# Patient Record
Sex: Male | Born: 1940
Health system: Southern US, Community
[De-identification: ages and names within clinical notes are randomized; demographics above are authoritative.]

## PROBLEM LIST (undated history)

## (undated) DIAGNOSIS — Z789 Other specified health status: Secondary | ICD-10-CM

## (undated) DIAGNOSIS — C259 Malignant neoplasm of pancreas, unspecified: Secondary | ICD-10-CM

## (undated) DIAGNOSIS — Z933 Colostomy status: Secondary | ICD-10-CM

## (undated) DIAGNOSIS — K56609 Unspecified intestinal obstruction, unspecified as to partial versus complete obstruction: Secondary | ICD-10-CM

## (undated) HISTORY — DX: Malignant neoplasm of pancreas, unspecified: C25.9

## (undated) HISTORY — PX: BOWEL RESECTION: SHX1257

## (undated) HISTORY — PX: VASECTOMY: SHX75

## (undated) HISTORY — PX: ABDOMINAL SURGERY: SHX537

---

## 2014-12-12 ENCOUNTER — Inpatient Hospital Stay (HOSPITAL_COMMUNITY)
Admission: EM | Admit: 2014-12-12 | Discharge: 2014-12-17 | DRG: 418 | Disposition: A | Discharge status: Home / Self Care | Payer: Medicare Other | Attending: Internal Medicine | Admitting: Internal Medicine

## 2014-12-12 ENCOUNTER — Encounter (HOSPITAL_COMMUNITY): Payer: Self-pay | Admitting: Emergency Medicine

## 2014-12-12 ENCOUNTER — Emergency Department (HOSPITAL_COMMUNITY): Payer: Medicare Other

## 2014-12-12 DIAGNOSIS — E871 Hypo-osmolality and hyponatremia: Secondary | ICD-10-CM | POA: Diagnosis present

## 2014-12-12 DIAGNOSIS — E876 Hypokalemia: Secondary | ICD-10-CM | POA: Diagnosis not present

## 2014-12-12 DIAGNOSIS — K8020 Calculus of gallbladder without cholecystitis without obstruction: Secondary | ICD-10-CM

## 2014-12-12 DIAGNOSIS — I10 Essential (primary) hypertension: Secondary | ICD-10-CM | POA: Diagnosis present

## 2014-12-12 DIAGNOSIS — R945 Abnormal results of liver function studies: Secondary | ICD-10-CM

## 2014-12-12 DIAGNOSIS — K851 Biliary acute pancreatitis: Principal | ICD-10-CM | POA: Diagnosis present

## 2014-12-12 DIAGNOSIS — R1084 Generalized abdominal pain: Secondary | ICD-10-CM

## 2014-12-12 DIAGNOSIS — K859 Acute pancreatitis without necrosis or infection, unspecified: Secondary | ICD-10-CM | POA: Diagnosis present

## 2014-12-12 DIAGNOSIS — K8070 Calculus of gallbladder and bile duct without cholecystitis without obstruction: Secondary | ICD-10-CM | POA: Diagnosis present

## 2014-12-12 DIAGNOSIS — E861 Hypovolemia: Secondary | ICD-10-CM | POA: Diagnosis present

## 2014-12-12 DIAGNOSIS — R7989 Other specified abnormal findings of blood chemistry: Secondary | ICD-10-CM

## 2014-12-12 HISTORY — DX: Other specified health status: Z78.9

## 2014-12-12 LAB — COMPREHENSIVE METABOLIC PANEL
ALK PHOS: 67 U/L (ref 39–117)
ALT: 212 U/L — AB (ref 0–53)
AST: 300 U/L — ABNORMAL HIGH (ref 0–37)
Albumin: 3.8 g/dL (ref 3.5–5.2)
Anion gap: 10 (ref 5–15)
BILIRUBIN TOTAL: 2.8 mg/dL — AB (ref 0.3–1.2)
BUN: 13 mg/dL (ref 6–23)
CO2: 22 mmol/L (ref 19–32)
Calcium: 9 mg/dL (ref 8.4–10.5)
Chloride: 100 mEq/L (ref 96–112)
Creatinine, Ser: 1 mg/dL (ref 0.50–1.35)
GFR calc non Af Amer: 73 mL/min — ABNORMAL LOW (ref >90)
GFR, EST AFRICAN AMERICAN: 85 mL/min — AB (ref >90)
GLUCOSE: 123 mg/dL — AB (ref 70–99)
Potassium: 4.3 mmol/L (ref 3.5–5.1)
SODIUM: 132 mmol/L — AB (ref 135–145)
Total Protein: 6.7 g/dL (ref 6.0–8.3)

## 2014-12-12 LAB — URINALYSIS, ROUTINE W REFLEX MICROSCOPIC
BILIRUBIN URINE: NEGATIVE
GLUCOSE, UA: NEGATIVE mg/dL
KETONES UR: 15 mg/dL — AB
Leukocytes, UA: NEGATIVE
Nitrite: NEGATIVE
PROTEIN: NEGATIVE mg/dL
Specific Gravity, Urine: >1.046 — ABNORMAL HIGH (ref 1.005–1.030)
Urobilinogen, UA: 0.2 mg/dL (ref 0.0–1.0)
pH: 5.5 (ref 5.0–8.0)

## 2014-12-12 LAB — CBC WITH DIFFERENTIAL/PLATELET
Basophils Absolute: 0 10*3/uL (ref 0.0–0.1)
Basophils Relative: 0 % (ref 0–1)
Eosinophils Absolute: 0 10*3/uL (ref 0.0–0.7)
Eosinophils Relative: 0 % (ref 0–5)
HCT: 45.2 % (ref 39.0–52.0)
Hemoglobin: 15.4 g/dL (ref 13.0–17.0)
LYMPHS ABS: 0.7 10*3/uL (ref 0.7–4.0)
Lymphocytes Relative: 8 % — ABNORMAL LOW (ref 12–46)
MCH: 31.2 pg (ref 26.0–34.0)
MCHC: 34.1 g/dL (ref 30.0–36.0)
MCV: 91.5 fL (ref 78.0–100.0)
MONOS PCT: 10 % (ref 3–12)
Monocytes Absolute: 0.9 10*3/uL (ref 0.1–1.0)
NEUTROS PCT: 82 % — AB (ref 43–77)
Neutro Abs: 7.4 10*3/uL (ref 1.7–7.7)
Platelets: 191 10*3/uL (ref 150–400)
RBC: 4.94 MIL/uL (ref 4.22–5.81)
RDW: 13.3 % (ref 11.5–15.5)
WBC: 8.9 10*3/uL (ref 4.0–10.5)

## 2014-12-12 LAB — URINE MICROSCOPIC-ADD ON

## 2014-12-12 LAB — MAGNESIUM: Magnesium: 2.1 mg/dL (ref 1.5–2.5)

## 2014-12-12 LAB — PHOSPHORUS: PHOSPHORUS: 2.5 mg/dL (ref 2.3–4.6)

## 2014-12-12 LAB — GLUCOSE, CAPILLARY: Glucose-Capillary: 112 mg/dL — ABNORMAL HIGH (ref 70–99)

## 2014-12-12 LAB — I-STAT CG4 LACTIC ACID, ED: Lactic Acid, Venous: 0.79 mmol/L (ref 0.5–2.2)

## 2014-12-12 LAB — LIPASE, BLOOD: Lipase: >3000 U/L — ABNORMAL HIGH (ref 11–59)

## 2014-12-12 MED ORDER — ONDANSETRON HCL 4 MG/2ML IJ SOLN
4.0000 mg | Freq: Four times a day (QID) | INTRAMUSCULAR | Status: DC | PRN
Start: 2014-12-12 — End: 2014-12-17

## 2014-12-12 MED ORDER — OXYCODONE HCL 5 MG PO TABS
5.0000 mg | ORAL_TABLET | ORAL | Status: DC | PRN
  Administered 2014-12-15: 5 mg via ORAL
  Filled 2014-12-12: qty 1

## 2014-12-12 MED ORDER — SODIUM CHLORIDE 0.9 % IV BOLUS (SEPSIS)
1000.0000 mL | INTRAVENOUS | Status: AC
  Administered 2014-12-12: 1000 mL via INTRAVENOUS

## 2014-12-12 MED ORDER — DEXTROSE-NACL 5-0.9 % IV SOLN
INTRAVENOUS | Status: DC
  Administered 2014-12-13 – 2014-12-17 (×9): via INTRAVENOUS

## 2014-12-12 MED ORDER — IOHEXOL 300 MG/ML  SOLN
25.0000 mL | Freq: Once | INTRAMUSCULAR | Status: AC | PRN
  Administered 2014-12-12: 25 mL via ORAL

## 2014-12-12 MED ORDER — HYDROMORPHONE HCL 1 MG/ML IJ SOLN
0.5000 mg | INTRAMUSCULAR | Status: AC
  Administered 2014-12-12: 0.5 mg via INTRAVENOUS
  Filled 2014-12-12: qty 1

## 2014-12-12 MED ORDER — IOHEXOL 300 MG/ML  SOLN
100.0000 mL | Freq: Once | INTRAMUSCULAR | Status: AC | PRN
  Administered 2014-12-12: 100 mL via INTRAVENOUS

## 2014-12-12 MED ORDER — HEPARIN SODIUM (PORCINE) 5000 UNIT/ML IJ SOLN
5000.0000 [IU] | Freq: Three times a day (TID) | INTRAMUSCULAR | Status: DC
  Administered 2014-12-12 – 2014-12-15 (×10): 5000 [IU] via SUBCUTANEOUS
  Filled 2014-12-12 (×18): qty 1

## 2014-12-12 MED ORDER — ONDANSETRON HCL 4 MG/2ML IJ SOLN
4.0000 mg | Freq: Once | INTRAMUSCULAR | Status: AC
  Administered 2014-12-12: 4 mg via INTRAVENOUS
  Filled 2014-12-12: qty 2

## 2014-12-12 MED ORDER — MORPHINE SULFATE 2 MG/ML IJ SOLN: 2.0000 mg | INTRAMUSCULAR | Status: DC | PRN

## 2014-12-12 MED ORDER — ONDANSETRON HCL 4 MG PO TABS: 4.0000 mg | ORAL_TABLET | Freq: Four times a day (QID) | ORAL | Status: DC | PRN

## 2014-12-12 NOTE — H&P (Signed)
[facility] History and Physical  Kevin Villegas [id number] DOB: 01-15-1941 DOA: 12/12/2014  Referring physician: Dr. Aline Brochure PCP: No primary care provider on file.   Chief Complaint: Abdominal discomfort  HPI: Kevin Villegas is a 73 y.o. male  Patient presents with 2 day complaint of abdominal discomfort. The pain since onset has waxed and waned. He denies it getting any worse with drinking fluids or eating foods. Given persistence and problem patient decided to present to the ED for further evaluation recommendations. The pain is at his upper abdomen and radiates to his back. Nothing he is aware of makes it better.  While in the ED patient had CT of abdomen which reported findings consistent with acute pancreatitis without evidence of necrosis or hemorrhage.  Review of Systems:  Constitutional:  No weight loss, night sweats, Fevers, chills, fatigue.  HEENT:  No headaches, Difficulty swallowing,Tooth/dental problems,Sore throat,  No sneezing, itching, ear ache, nasal congestion, post nasal drip,  Cardio-vascular:  No chest pain, Orthopnea, PND, swelling in lower extremities, anasarca, dizziness, palpitations  GI:  No heartburn, indigestion, + abdominal pain, nausea, vomiting, diarrhea, change in bowel habits, loss of appetite  Resp:  No shortness of breath with exertion or at rest. No excess mucus, no productive cough, No non-productive cough, No coughing up of blood.No change in color of mucus.No wheezing.No chest wall deformity  Skin:  no rash or lesions.  GU:  no dysuria, change in color of urine, no urgency or frequency. No flank pain.  Musculoskeletal:  No joint pain or swelling. No decreased range of motion. No back pain.  Psych:  No change in mood or affect. No depression or anxiety. No memory loss.   History reviewed. No pertinent past medical history. History reviewed. No pertinent past surgical history. Social History:  reports that he has never smoked. He  does not have any smokeless tobacco history on file. He reports that he does not drink alcohol or use illicit drugs.  No Known Allergies  Family history - Denies any family history of any illnesses  Prior to Admission medications   Not on File   Physical Exam: Filed Vitals:   12/12/14 1400 12/12/14 1500 12/12/14 1613 12/12/14 1615  BP: 164/92 161/80 158/84   Pulse: 60 62  69  Temp:      TempSrc:      Resp:      SpO2: 94% 96%  96%    Wt Readings from Last 3 Encounters:  No data found for Wt    General:  Appears calm and comfortable Eyes: PERRL, normal lids, irises & conjunctiva ENT: grossly normal hearing, lips & tongue Neck: no LAD, masses or thyromegaly Cardiovascular: RRR, no m/r/g. No LE edema.  Respiratory: CTA bilaterally, no w/r/r. Normal respiratory effort. Abdomen: soft, nt, nd, no rebound tenderness no guarding Skin: no rash or induration seen on limited exam Musculoskeletal: grossly normal tone BUE/BLE Psychiatric: grossly normal mood and affect, speech fluent and appropriate Neurologic: no facial asymmetry, moves extremities equally, answers questions appropriately          Labs on Admission:  Basic Metabolic Panel:  Recent Labs Lab 12/12/14 1350  NA 132*  K 4.3  CL 100  CO2 22  GLUCOSE 123*  BUN 13  CREATININE 1.00  CALCIUM 9.0   Liver Function Tests:  Recent Labs Lab 12/12/14 1350  AST 300*  ALT 212*  ALKPHOS 67  BILITOT 2.8*  PROT 6.7  ALBUMIN 3.8   No results for input(s): LIPASE, AMYLASE in  the last 168 hours. No results for input(s): AMMONIA in the last 168 hours. CBC:  Recent Labs Lab 12/12/14 1350  WBC 8.9  NEUTROABS 7.4  HGB 15.4  HCT 45.2  MCV 91.5  PLT 191   Cardiac Enzymes: No results for input(s): CKTOTAL, CKMB, CKMBINDEX, TROPONINI in the last 168 hours.  BNP (last 3 results) No results for input(s): PROBNP in the last 8760 hours. CBG: No results for input(s): GLUCAP in the last 168 hours.  Radiological  Exams on Admission: Ct Abdomen Pelvis W Contrast  12/12/2014   CLINICAL DATA:  Abdominal pain.  Initial encounter.  EXAM: CT ABDOMEN AND PELVIS WITH CONTRAST  TECHNIQUE: Multidetector CT imaging of the abdomen and pelvis was performed using the standard protocol following bolus administration of intravenous contrast.  CONTRAST:  136mL OMNIPAQUE IOHEXOL 300 MG/ML  SOLN  COMPARISON:  None.  FINDINGS: Stranding throughout the parenchyma of the pancreas compatible with acute pancreatitis. No localized fluid collection about the pancreas. There is stranding about the retroperitoneal fat. No evidence of pancreatic hemorrhage or necrosis.  Several gallstones are noted  Several hypodensities throughout the liver are likely small cysts.  Adrenal glands and spleen are within normal limits. Bilateral benign-appearing renal cysts with cortical atrophy. Some hypodensities in the kidneys are too small to characterize  Moderate aortic valvular calcification.  Bladder is distended.  Unremarkable prostate  Extensive sigmoid diverticulosis. Moderate diverticulosis throughout the remainder of the colon.  Free fluid in the right lower quadrant, lesser sac, and pelvis likely related to pancreatitis.  Advanced degenerative disc disease in the lumbar spine.  IMPRESSION: Findings compatible with acute pancreatitis. No evidence of necrosis or hemorrhage. No acute pancreatic fluid collection.  Cholelithiasis.   Electronically Signed   By: Maryclare Bean M.D.   On: 12/12/2014 15:54     Assessment/Plan Active Problems:   Acute pancreatitis - Bowel rest, place on maintenance IV fluids - Supportive therapy with pain control and antiemetics - Lipid panel - Patient does not drink alcohol - Most likely cause is gallstones of which patient once he has improvement in condition can follow up electively with general surgery  Code Status: full DVT Prophylaxis: heparin Family Communication: No family at bedside Disposition Plan: Pending  improvement in condition  Time spent: > 40 minutes  Kevin Villegas [facility] Pager [phone]

## 2014-12-12 NOTE — ED Notes (Signed)
Pt c/o abdominal pain and back pain. Pt denies N/V. Abdomen tender to touch.

## 2014-12-12 NOTE — ED Notes (Signed)
Attempted to collect urine, pt states he voided at CT and it was not collected. States he will attempt later

## 2014-12-12 NOTE — ED Notes (Signed)
Pt given ice chips per MD

## 2014-12-12 NOTE — ED Provider Notes (Signed)
CSN: [id number]     Arrival date & time 12/12/14  1313 History   First MD Initiated Contact with Patient 12/12/14 1316     Chief Complaint  Patient presents with  . Abdominal Pain  . Back Pain     (Consider location/radiation/quality/duration/timing/severity/associated sxs/prior Treatment) Patient is a 73 y.o. male presenting with abdominal pain and back pain.  Abdominal Pain Pain location:  Generalized Pain quality: aching   Pain radiates to:  Back Pain severity:  Moderate Onset quality:  Sudden Duration:  1 day Timing:  Constant Progression:  Worsening Chronicity:  New Context comment:  Sponatneously Relieved by:  Nothing Worsened by:  Nothing tried Ineffective treatments:  None tried Associated symptoms: no chest pain, no cough, no diarrhea, no dysuria, no fever, no hematuria, no nausea, no shortness of breath and no vomiting   Back Pain Associated symptoms: abdominal pain   Associated symptoms: no chest pain, no dysuria, no fever, no headaches and no numbness     History reviewed. No pertinent past medical history. History reviewed. No pertinent past surgical history. No family history on file. History  Substance Use Topics  . Smoking status: Never Smoker   . Smokeless tobacco: Not on file  . Alcohol Use: No    Review of Systems  Constitutional: Negative for fever.  HENT: Negative for drooling and rhinorrhea.   Eyes: Negative for pain.  Respiratory: Negative for cough and shortness of breath.   Cardiovascular: Negative for chest pain and leg swelling.  Gastrointestinal: Positive for abdominal pain. Negative for nausea, vomiting and diarrhea.  Genitourinary: Negative for dysuria and hematuria.  Musculoskeletal: Positive for back pain. Negative for gait problem and neck pain.  Skin: Negative for color change.  Neurological: Negative for numbness and headaches.  Hematological: Negative for adenopathy.  Psychiatric/Behavioral: Negative for behavioral problems.   All other systems reviewed and are negative.     Allergies  Review of patient's allergies indicates no known allergies.  Home Medications   Prior to Admission medications   Not on File   BP 183/112 mmHg  Pulse 75  Temp(Src) 97.5 F (36.4 C) (Oral)  Resp 18  SpO2 96% Physical Exam  Constitutional: He is oriented to person, place, and time. He appears well-developed and well-nourished.  HENT:  Head: Normocephalic and atraumatic.  Right Ear: External ear normal.  Left Ear: External ear normal.  Nose: Nose normal.  Mouth/Throat: Oropharynx is clear and moist. No oropharyngeal exudate.  Eyes: Conjunctivae and EOM are normal. Pupils are equal, round, and reactive to light.  Neck: Normal range of motion. Neck supple.  Cardiovascular: Normal rate, regular rhythm, normal heart sounds and intact distal pulses.  Exam reveals no gallop and no friction rub.   No murmur heard. Pulmonary/Chest: Effort normal and breath sounds normal. No respiratory distress. He has no wheezes.  Abdominal: Soft. Bowel sounds are normal. He exhibits no distension. There is tenderness. There is no rebound and no guarding.  Diffuse tenderness to palpation of the abdomen which appears worse in the epigastric area.  Musculoskeletal: Normal range of motion. He exhibits no edema or tenderness.  Neurological: He is alert and oriented to person, place, and time.  Skin: Skin is warm and dry.  Psychiatric: He has a normal mood and affect. His behavior is normal.  Nursing note and vitals reviewed.   ED Course  Procedures (including critical care time) Labs Review Labs Reviewed  CBC WITH DIFFERENTIAL - Abnormal; Notable for the following:    Neutrophils Relative %  82 (*)    Lymphocytes Relative 8 (*)    All other components within normal limits  COMPREHENSIVE METABOLIC PANEL - Abnormal; Notable for the following:    Sodium 132 (*)    Glucose, Bld 123 (*)    AST 300 (*)    ALT 212 (*)    Total Bilirubin 2.8  (*)    GFR calc non Af Amer 73 (*)    GFR calc Af Amer 85 (*)    All other components within normal limits  LIPASE, BLOOD  URINALYSIS, ROUTINE W REFLEX MICROSCOPIC  I-STAT CG4 LACTIC ACID, ED    Imaging Review Ct Abdomen Pelvis W Contrast  12/12/2014   CLINICAL DATA:  Abdominal pain.  Initial encounter.  EXAM: CT ABDOMEN AND PELVIS WITH CONTRAST  TECHNIQUE: Multidetector CT imaging of the abdomen and pelvis was performed using the standard protocol following bolus administration of intravenous contrast.  CONTRAST:  114mL OMNIPAQUE IOHEXOL 300 MG/ML  SOLN  COMPARISON:  None.  FINDINGS: Stranding throughout the parenchyma of the pancreas compatible with acute pancreatitis. No localized fluid collection about the pancreas. There is stranding about the retroperitoneal fat. No evidence of pancreatic hemorrhage or necrosis.  Several gallstones are noted  Several hypodensities throughout the liver are likely small cysts.  Adrenal glands and spleen are within normal limits. Bilateral benign-appearing renal cysts with cortical atrophy. Some hypodensities in the kidneys are too small to characterize  Moderate aortic valvular calcification.  Bladder is distended.  Unremarkable prostate  Extensive sigmoid diverticulosis. Moderate diverticulosis throughout the remainder of the colon.  Free fluid in the right lower quadrant, lesser sac, and pelvis likely related to pancreatitis.  Advanced degenerative disc disease in the lumbar spine.  IMPRESSION: Findings compatible with acute pancreatitis. No evidence of necrosis or hemorrhage. No acute pancreatic fluid collection.  Cholelithiasis.   Electronically Signed   By: Maryclare Bean M.D.   On: 12/12/2014 15:54     EKG Interpretation None      MDM   Final diagnoses:  Abdominal pain, generalized  Acute pancreatitis, unspecified pancreatitis type    1:26 PM 73 y.o. male who presents with sudden onset diffuse abdominal pain which started yesterday and has been  worsening since that time. He feels like the pain is slightly worse in the epigastric area. He states he has no medical problems, does not drink, does not smoke. He is mildly hypertensive here, vital signs otherwise unremarkable. Abdomen is soft with diffuse tenderness worse in the epigastric area. We'll get CT scan of abdomen and pain control with Dilaudid.  Pt found to have pancreatitis. Will admit to hospitalist.   Pamella Pert, MD 12/12/14 [phone]

## 2014-12-13 DIAGNOSIS — E871 Hypo-osmolality and hyponatremia: Secondary | ICD-10-CM

## 2014-12-13 DIAGNOSIS — I10 Essential (primary) hypertension: Secondary | ICD-10-CM | POA: Diagnosis present

## 2014-12-13 DIAGNOSIS — K851 Biliary acute pancreatitis: Principal | ICD-10-CM

## 2014-12-13 DIAGNOSIS — E876 Hypokalemia: Secondary | ICD-10-CM | POA: Diagnosis not present

## 2014-12-13 DIAGNOSIS — R1013 Epigastric pain: Secondary | ICD-10-CM

## 2014-12-13 DIAGNOSIS — E861 Hypovolemia: Secondary | ICD-10-CM | POA: Diagnosis present

## 2014-12-13 DIAGNOSIS — K8070 Calculus of gallbladder and bile duct without cholecystitis without obstruction: Secondary | ICD-10-CM | POA: Diagnosis present

## 2014-12-13 DIAGNOSIS — R740 Nonspecific elevation of levels of transaminase and lactic acid dehydrogenase [LDH]: Secondary | ICD-10-CM

## 2014-12-13 DIAGNOSIS — R1084 Generalized abdominal pain: Secondary | ICD-10-CM | POA: Diagnosis present

## 2014-12-13 LAB — LIPID PANEL
CHOL/HDL RATIO: 2.2 ratio
CHOLESTEROL: 170 mg/dL (ref 0–200)
HDL: 77 mg/dL (ref >39)
LDL CALC: 80 mg/dL (ref 0–99)
TRIGLYCERIDES: 66 mg/dL (ref <150)
VLDL: 13 mg/dL (ref 0–40)

## 2014-12-13 LAB — GLUCOSE, CAPILLARY
GLUCOSE-CAPILLARY: 111 mg/dL — AB (ref 70–99)
GLUCOSE-CAPILLARY: 112 mg/dL — AB (ref 70–99)
GLUCOSE-CAPILLARY: 115 mg/dL — AB (ref 70–99)
GLUCOSE-CAPILLARY: 115 mg/dL — AB (ref 70–99)
Glucose-Capillary: 104 mg/dL — ABNORMAL HIGH (ref 70–99)
Glucose-Capillary: 112 mg/dL — ABNORMAL HIGH (ref 70–99)
Glucose-Capillary: 113 mg/dL — ABNORMAL HIGH (ref 70–99)

## 2014-12-13 LAB — BASIC METABOLIC PANEL
Anion gap: 8 (ref 5–15)
BUN: 7 mg/dL (ref 6–23)
CHLORIDE: 102 meq/L (ref 96–112)
CO2: 25 mmol/L (ref 19–32)
CREATININE: 1.01 mg/dL (ref 0.50–1.35)
Calcium: 8.5 mg/dL (ref 8.4–10.5)
GFR calc Af Amer: 84 mL/min — ABNORMAL LOW (ref >90)
GFR calc non Af Amer: 72 mL/min — ABNORMAL LOW (ref >90)
GLUCOSE: 124 mg/dL — AB (ref 70–99)
Potassium: 3.9 mmol/L (ref 3.5–5.1)
Sodium: 135 mmol/L (ref 135–145)

## 2014-12-13 LAB — CBC
HEMATOCRIT: 41 % (ref 39.0–52.0)
HEMOGLOBIN: 13.7 g/dL (ref 13.0–17.0)
MCH: 30.9 pg (ref 26.0–34.0)
MCHC: 33.4 g/dL (ref 30.0–36.0)
MCV: 92.6 fL (ref 78.0–100.0)
Platelets: 193 10*3/uL (ref 150–400)
RBC: 4.43 MIL/uL (ref 4.22–5.81)
RDW: 13.4 % (ref 11.5–15.5)
WBC: 8.5 10*3/uL (ref 4.0–10.5)

## 2014-12-13 MED ORDER — CHLORHEXIDINE GLUCONATE 0.12 % MT SOLN
15.0000 mL | Freq: Two times a day (BID) | OROMUCOSAL | Status: DC
  Administered 2014-12-13 – 2014-12-15 (×6): 15 mL via OROMUCOSAL
  Filled 2014-12-13 (×6): qty 15

## 2014-12-13 MED ORDER — CETYLPYRIDINIUM CHLORIDE 0.05 % MT LIQD
7.0000 mL | Freq: Two times a day (BID) | OROMUCOSAL | Status: DC
  Administered 2014-12-14 – 2014-12-15 (×3): 7 mL via OROMUCOSAL

## 2014-12-13 MED ORDER — AMLODIPINE BESYLATE 5 MG PO TABS
5.0000 mg | ORAL_TABLET | Freq: Every day | ORAL | Status: DC
  Administered 2014-12-13 – 2014-12-14 (×2): 5 mg via ORAL
  Filled 2014-12-13 (×2): qty 1

## 2014-12-13 NOTE — Progress Notes (Signed)
[facility] PROGRESS NOTE  Kevin Villegas [id number] DOB: November 02, 1941 DOA: 12/12/2014 PCP: No primary care provider on file.  Assessment/Plan: 1. Suspected gallstone pancreatitis -Patient denies history of alcohol abuse, with fasting lipid panel showing triglycerides of 66.  -CT scan of abdomen and pelvis showed a pancreatitis along with evidence of cholelithiasis. -I suspect this represents gallstone pancreatitis for which general surgery was consulted -Patient reporting significant improvement to his pain, advancing diet to clears today. -Continue supportive care, await surgery's recommendations.  2.  Hyponatremia. -Initial labs showing sodium of 132, improved to 135 -Likely secondary to hypovolemia  3.  Elevated transaminases -Labs showing ALT of 212 with AST of 300. Patient denies history of alcohol abuse.  -CT scan of abdomen and pelvis showing hypodensities throughout liver likely representing small cysts long with presence of stones. -Suspect secondary to gallbadder disease -Surgery consulted  4.  Hypertension -Added Norvasc 5 mg by mouth daily  Code Status: Full code Family Communication:  Disposition Plan: Anticipate discharge home when medically stable   Consultants:  General surgery   HPI/Subjective: Patient is a pleasant 73 year old gentleman withthickened past medical history who was admitted to the medicine service on 12/12/2014 presenting with severe abdominal discomfort progressively worsening over the past 2 days. He is highly functioning, denies alcohol abuse and states having an overall healthy diet. Lab work revealed a lipase level greater than 3000. He had a CT scan of abdomen and pelvis showing findings compatible with acute pancreatitis. Also noted was a presence of cholelithiasis, without evidence of acute cholecystitis. Patient was admitted to [facility], made nothing by mouth, started on IV fluids, narcotic analgesics for pain control. Case was  discussed with general surgery regarding the possibility of gallstone pancreatitis.  Objective: Filed Vitals:   12/13/14 0520  BP: 164/76  Pulse: 68  Temp: 99.2 F (37.3 C)  Resp: 16    Intake/Output Summary (Last 24 hours) at 12/13/14 [id number] Last data filed at 12/13/14 0730  Gross per 24 hour  Intake    800 ml  Output    300 ml  Net    500 ml   Filed Weights   12/12/14 1736  Weight: 86.183 kg (190 lb)    Exam:   General:  Patient is in no acute distress, reports feeling better awake and alert  Cardiovascular: Regular rate and rhythm normal S1-S2 no murmurs rubs or gallops  Respiratory: Normal respiratory effort, lungs are clear to auscultation bilaterally  Abdomen: His abdomen is soft, mild tenderness to palpation over epigastric region, positive bowel sounds across all 4 quadrants  Musculoskeletal: No edema  Data Reviewed: Basic Metabolic Panel:  Recent Labs Lab 12/12/14 1350 12/12/14 1955 12/13/14 0420  NA 132*  --  135  K 4.3  --  3.9  CL 100  --  102  CO2 22  --  25  GLUCOSE 123*  --  124*  BUN 13  --  7  CREATININE 1.00  --  1.01  CALCIUM 9.0  --  8.5  MG  --  2.1  --   PHOS  --  2.5  --    Liver Function Tests:  Recent Labs Lab 12/12/14 1350  AST 300*  ALT 212*  ALKPHOS 67  BILITOT 2.8*  PROT 6.7  ALBUMIN 3.8    Recent Labs Lab 12/12/14 1350  LIPASE >3000*   No results for input(s): AMMONIA in the last 168 hours. CBC:  Recent Labs Lab 12/12/14 1350 12/13/14 0420  WBC 8.9 8.5  NEUTROABS 7.4  --   HGB 15.4 13.7  HCT 45.2 41.0  MCV 91.5 92.6  PLT 191 193   Cardiac Enzymes: No results for input(s): CKTOTAL, CKMB, CKMBINDEX, TROPONINI in the last 168 hours. BNP (last 3 results) No results for input(s): PROBNP in the last 8760 hours. CBG:  Recent Labs Lab 12/12/14 2005 12/12/14 2359 12/13/14 0354 12/13/14 0731  GLUCAP 112* 112* 104* 115*    No results found for this or any previous visit (from the past 240 hour(s)).    Studies: Ct Abdomen Pelvis W Contrast  12/12/2014   CLINICAL DATA:  Abdominal pain.  Initial encounter.  EXAM: CT ABDOMEN AND PELVIS WITH CONTRAST  TECHNIQUE: Multidetector CT imaging of the abdomen and pelvis was performed using the standard protocol following bolus administration of intravenous contrast.  CONTRAST:  146mL OMNIPAQUE IOHEXOL 300 MG/ML  SOLN  COMPARISON:  None.  FINDINGS: Stranding throughout the parenchyma of the pancreas compatible with acute pancreatitis. No localized fluid collection about the pancreas. There is stranding about the retroperitoneal fat. No evidence of pancreatic hemorrhage or necrosis.  Several gallstones are noted  Several hypodensities throughout the liver are likely small cysts.  Adrenal glands and spleen are within normal limits. Bilateral benign-appearing renal cysts with cortical atrophy. Some hypodensities in the kidneys are too small to characterize  Moderate aortic valvular calcification.  Bladder is distended.  Unremarkable prostate  Extensive sigmoid diverticulosis. Moderate diverticulosis throughout the remainder of the colon.  Free fluid in the right lower quadrant, lesser sac, and pelvis likely related to pancreatitis.  Advanced degenerative disc disease in the lumbar spine.  IMPRESSION: Findings compatible with acute pancreatitis. No evidence of necrosis or hemorrhage. No acute pancreatic fluid collection.  Cholelithiasis.   Electronically Signed   By: Maryclare Bean M.D.   On: 12/12/2014 15:54    Scheduled Meds: . antiseptic oral rinse  7 mL Mouth Rinse q12n4p  . chlorhexidine  15 mL Mouth Rinse BID  . heparin  5,000 Units Subcutaneous 3 times per day   Continuous Infusions: . dextrose 5 % and 0.9% NaCl      Active Problems:   Acute pancreatitis    Time spent: 35 min    Kelvin Cellar  [facility] Pager [phone]. If 7PM-7AM, please contact night-coverage at [email or web address], password [email or web address] 12/13/2014, 9:28 AM  LOS: 1 day

## 2014-12-13 NOTE — Consult Note (Signed)
Reason for Consult:Pancreatitis Referring Physician: E. Colbe Villegas is an 73 y.o. male.  HPI: Kevin Villegas was in his usual state of health when he lied down for a nap on Wednesday afternoon. He was awakened by severe epigastric abdominal and back pain. This eased off somewhat but remained persistent until he sought evaluation in the ED on Friday. He denies any change with eating or drinking. He denies any prior episodes. Drinks alcohol rarely.  Past Medical History  Diagnosis Date  . Medical history non-contributory     Past Surgical History  Procedure Laterality Date  . No past surgeries      History reviewed. No pertinent family history.  Social History:  reports that he has never smoked. He does not have any smokeless tobacco history on file. He reports that he does not use illicit drugs. His alcohol history is not on file.  Allergies: No Known Allergies  Medications: I have reviewed the patient's current medications.  Results for orders placed or performed during the hospital encounter of 12/12/14 (from the past 48 hour(s))  CBC with Differential     Status: Abnormal   Collection Time: 12/12/14  1:50 PM  Result Value Ref Range   WBC 8.9 4.0 - 10.5 K/uL   RBC 4.94 4.22 - 5.81 MIL/uL   Hemoglobin 15.4 13.0 - 17.0 g/dL   HCT 45.2 39.0 - 52.0 %   MCV 91.5 78.0 - 100.0 fL   MCH 31.2 26.0 - 34.0 pg   MCHC 34.1 30.0 - 36.0 g/dL   RDW 13.3 11.5 - 15.5 %   Platelets 191 150 - 400 K/uL   Neutrophils Relative % 82 (H) 43 - 77 %   Neutro Abs 7.4 1.7 - 7.7 K/uL   Lymphocytes Relative 8 (L) 12 - 46 %   Lymphs Abs 0.7 0.7 - 4.0 K/uL   Monocytes Relative 10 3 - 12 %   Monocytes Absolute 0.9 0.1 - 1.0 K/uL   Eosinophils Relative 0 0 - 5 %   Eosinophils Absolute 0.0 0.0 - 0.7 K/uL   Basophils Relative 0 0 - 1 %   Basophils Absolute 0.0 0.0 - 0.1 K/uL  Comprehensive metabolic panel     Status: Abnormal   Collection Time: 12/12/14  1:50 PM  Result Value Ref Range   Sodium 132  (L) 135 - 145 mmol/L    Comment: Please note change in reference range.   Potassium 4.3 3.5 - 5.1 mmol/L    Comment: Please note change in reference range.   Chloride 100 96 - 112 mEq/L   CO2 22 19 - 32 mmol/L   Glucose, Bld 123 (H) 70 - 99 mg/dL   BUN 13 6 - 23 mg/dL   Creatinine, Ser 1.00 0.50 - 1.35 mg/dL   Calcium 9.0 8.4 - 10.5 mg/dL   Total Protein 6.7 6.0 - 8.3 g/dL   Albumin 3.8 3.5 - 5.2 g/dL   AST 300 (H) 0 - 37 U/L   ALT 212 (H) 0 - 53 U/L   Alkaline Phosphatase 67 39 - 117 U/L   Total Bilirubin 2.8 (H) 0.3 - 1.2 mg/dL   GFR calc non Af Amer 73 (L) >90 mL/min   GFR calc Af Amer 85 (L) >90 mL/min    Comment: (NOTE) The eGFR has been calculated using the CKD EPI equation. This calculation has not been validated in all clinical situations. eGFR's persistently <90 mL/min signify possible Chronic Kidney Disease.    Anion gap 10 5 -  15  Lipase, blood     Status: Abnormal   Collection Time: 12/12/14  1:50 PM  Result Value Ref Range   Lipase >3000 (H) 11 - 59 U/L    Comment: RESULTS CONFIRMED BY MANUAL DILUTION  I-Stat CG4 Lactic Acid, ED     Status: None   Collection Time: 12/12/14  2:01 PM  Result Value Ref Range   Lactic Acid, Venous 0.79 0.5 - 2.2 mmol/L  Urinalysis, Routine w reflex microscopic     Status: Abnormal   Collection Time: 12/12/14  5:21 PM  Result Value Ref Range   Color, Urine YELLOW YELLOW   APPearance CLEAR CLEAR   Specific Gravity, Urine >1.046 (H) 1.005 - 1.030   pH 5.5 5.0 - 8.0   Glucose, UA NEGATIVE NEGATIVE mg/dL   Hgb urine dipstick TRACE (A) NEGATIVE   Bilirubin Urine NEGATIVE NEGATIVE   Ketones, ur 15 (A) NEGATIVE mg/dL   Protein, ur NEGATIVE NEGATIVE mg/dL   Urobilinogen, UA 0.2 0.0 - 1.0 mg/dL   Nitrite NEGATIVE NEGATIVE   Leukocytes, UA NEGATIVE NEGATIVE  Urine microscopic-add on     Status: None   Collection Time: 12/12/14  5:21 PM  Result Value Ref Range   Squamous Epithelial / LPF RARE RARE   WBC, UA 0-2 <3 WBC/hpf   RBC /  HPF 0-2 <3 RBC/hpf   Urine-Other MANY YEAST   Magnesium     Status: None   Collection Time: 12/12/14  7:55 PM  Result Value Ref Range   Magnesium 2.1 1.5 - 2.5 mg/dL  Phosphorus     Status: None   Collection Time: 12/12/14  7:55 PM  Result Value Ref Range   Phosphorus 2.5 2.3 - 4.6 mg/dL  Glucose, capillary     Status: Abnormal   Collection Time: 12/12/14  8:05 PM  Result Value Ref Range   Glucose-Capillary 112 (H) 70 - 99 mg/dL   Comment 1 Notify RN    Comment 2 Documented in Chart   Glucose, capillary     Status: Abnormal   Collection Time: 12/12/14 11:59 PM  Result Value Ref Range   Glucose-Capillary 112 (H) 70 - 99 mg/dL   Comment 1 Notify RN    Comment 2 Documented in Chart   Glucose, capillary     Status: Abnormal   Collection Time: 12/13/14  3:54 AM  Result Value Ref Range   Glucose-Capillary 104 (H) 70 - 99 mg/dL   Comment 1 Notify RN    Comment 2 Documented in Chart   Basic metabolic panel     Status: Abnormal   Collection Time: 12/13/14  4:20 AM  Result Value Ref Range   Sodium 135 135 - 145 mmol/L    Comment: Please note change in reference range.   Potassium 3.9 3.5 - 5.1 mmol/L    Comment: Please note change in reference range.   Chloride 102 96 - 112 mEq/L   CO2 25 19 - 32 mmol/L   Glucose, Bld 124 (H) 70 - 99 mg/dL   BUN 7 6 - 23 mg/dL   Creatinine, Ser 1.01 0.50 - 1.35 mg/dL   Calcium 8.5 8.4 - 10.5 mg/dL   GFR calc non Af Amer 72 (L) >90 mL/min   GFR calc Af Amer 84 (L) >90 mL/min    Comment: (NOTE) The eGFR has been calculated using the CKD EPI equation. This calculation has not been validated in all clinical situations. eGFR's persistently <90 mL/min signify possible Chronic Kidney Disease.  Anion gap 8 5 - 15  CBC     Status: None   Collection Time: 12/13/14  4:20 AM  Result Value Ref Range   WBC 8.5 4.0 - 10.5 K/uL   RBC 4.43 4.22 - 5.81 MIL/uL   Hemoglobin 13.7 13.0 - 17.0 g/dL   HCT 41.0 39.0 - 52.0 %   MCV 92.6 78.0 - 100.0 fL   MCH  30.9 26.0 - 34.0 pg   MCHC 33.4 30.0 - 36.0 g/dL   RDW 13.4 11.5 - 15.5 %   Platelets 193 150 - 400 K/uL  Lipid panel     Status: None   Collection Time: 12/13/14  4:20 AM  Result Value Ref Range   Cholesterol 170 0 - 200 mg/dL   Triglycerides 66 <150 mg/dL   HDL 77 >39 mg/dL   Total CHOL/HDL Ratio 2.2 RATIO   VLDL 13 0 - 40 mg/dL   LDL Cholesterol 80 0 - 99 mg/dL    Comment:        Total Cholesterol/HDL:CHD Risk Coronary Heart Disease Risk Table                     Men   Women  1/2 Average Risk   3.4   3.3  Average Risk       5.0   4.4  2 X Average Risk   9.6   7.1  3 X Average Risk  23.4   11.0        Use the calculated Patient Ratio above and the CHD Risk Table to determine the patient's CHD Risk.        ATP III CLASSIFICATION (LDL):  <100     mg/dL   Optimal  100-129  mg/dL   Near or Above                    Optimal  130-159  mg/dL   Borderline  160-189  mg/dL   High  >190     mg/dL   Very High   Glucose, capillary     Status: Abnormal   Collection Time: 12/13/14  7:31 AM  Result Value Ref Range   Glucose-Capillary 115 (H) 70 - 99 mg/dL    Ct Abdomen Pelvis W Contrast  12/12/2014   CLINICAL DATA:  Abdominal pain.  Initial encounter.  EXAM: CT ABDOMEN AND PELVIS WITH CONTRAST  TECHNIQUE: Multidetector CT imaging of the abdomen and pelvis was performed using the standard protocol following bolus administration of intravenous contrast.  CONTRAST:  157m OMNIPAQUE IOHEXOL 300 MG/ML  SOLN  COMPARISON:  None.  FINDINGS: Stranding throughout the parenchyma of the pancreas compatible with acute pancreatitis. No localized fluid collection about the pancreas. There is stranding about the retroperitoneal fat. No evidence of pancreatic hemorrhage or necrosis.  Several gallstones are noted  Several hypodensities throughout the liver are likely small cysts.  Adrenal glands and spleen are within normal limits. Bilateral benign-appearing renal cysts with cortical atrophy. Some  hypodensities in the kidneys are too small to characterize  Moderate aortic valvular calcification.  Bladder is distended.  Unremarkable prostate  Extensive sigmoid diverticulosis. Moderate diverticulosis throughout the remainder of the colon.  Free fluid in the right lower quadrant, lesser sac, and pelvis likely related to pancreatitis.  Advanced degenerative disc disease in the lumbar spine.  IMPRESSION: Findings compatible with acute pancreatitis. No evidence of necrosis or hemorrhage. No acute pancreatic fluid collection.  Cholelithiasis.   Electronically Signed   By:  Art  Hoss M.D.   On: 12/12/2014 15:54    Review of Systems  Constitutional: Negative for fever, chills and weight loss.  Gastrointestinal: Positive for abdominal pain. Negative for nausea and vomiting.  All other systems reviewed and are negative.  Blood pressure 164/76, pulse 68, temperature 99.2 F (37.3 C), temperature source Oral, resp. rate 16, height 5' 11"  (1.803 m), weight 190 lb (86.183 kg), SpO2 96 %. Physical Exam  Constitutional: He appears well-developed and well-nourished. No distress.  HENT:  Head: Normocephalic and atraumatic.  Eyes: Conjunctivae are normal. No scleral icterus.  Neck: Normal range of motion. Neck supple.  Cardiovascular: Normal rate, regular rhythm, normal heart sounds and intact distal pulses.  Exam reveals no gallop and no friction rub.   No murmur heard. Respiratory: Effort normal and breath sounds normal. No respiratory distress. He has no wheezes. He has no rales. He exhibits no tenderness.  GI: Soft. Bowel sounds are normal. He exhibits no distension. There is no tenderness. There is no rebound and no guarding.  Musculoskeletal: Normal range of motion.  Lymphadenopathy:    He has no cervical adenopathy.  Neurological: He is alert.  Skin: Skin is warm and dry. He is not diaphoretic.  Psychiatric: He has a normal mood and affect. His behavior is normal.    Assessment/Plan: GS  pancreatitis -- Recommend compete bowel rest, aggressive hydration, and repeat labs in am. If prompt and uncomplicated resolution of his pancreatitis occurs hope for cholecystectomy this admission. Cholelithiasis -- Will need elective cholecystecomy whether this admission or electively as outpatient. He is due to go to [place] shortly where he spends his winters -- this may complicate things somewhat.    Lisette Abu, PA-C Pager: [phone] 12/13/2014, 10:56 AM

## 2014-12-14 DIAGNOSIS — K859 Acute pancreatitis, unspecified: Secondary | ICD-10-CM

## 2014-12-14 LAB — GLUCOSE, CAPILLARY
GLUCOSE-CAPILLARY: 130 mg/dL — AB (ref 70–99)
GLUCOSE-CAPILLARY: 110 mg/dL — AB (ref 70–99)
GLUCOSE-CAPILLARY: 104 mg/dL — AB (ref 70–99)
Glucose-Capillary: 117 mg/dL — ABNORMAL HIGH (ref 70–99)
Glucose-Capillary: 105 mg/dL — ABNORMAL HIGH (ref 70–99)
Glucose-Capillary: 94 mg/dL (ref 70–99)

## 2014-12-14 LAB — CBC
HCT: 38.6 % — ABNORMAL LOW (ref 39.0–52.0)
Hemoglobin: 12.9 g/dL — ABNORMAL LOW (ref 13.0–17.0)
MCH: 31.2 pg (ref 26.0–34.0)
MCHC: 33.4 g/dL (ref 30.0–36.0)
MCV: 93.2 fL (ref 78.0–100.0)
PLATELETS: 192 10*3/uL (ref 150–400)
RBC: 4.14 MIL/uL — ABNORMAL LOW (ref 4.22–5.81)
RDW: 13.3 % (ref 11.5–15.5)
WBC: 8.8 10*3/uL (ref 4.0–10.5)

## 2014-12-14 LAB — COMPREHENSIVE METABOLIC PANEL
ALBUMIN: 2.9 g/dL — AB (ref 3.5–5.2)
ALK PHOS: 76 U/L (ref 39–117)
ALT: 128 U/L — ABNORMAL HIGH (ref 0–53)
ANION GAP: 6 (ref 5–15)
AST: 89 U/L — AB (ref 0–37)
BUN: 5 mg/dL — ABNORMAL LOW (ref 6–23)
CHLORIDE: 104 meq/L (ref 96–112)
CO2: 25 mmol/L (ref 19–32)
Calcium: 8.3 mg/dL — ABNORMAL LOW (ref 8.4–10.5)
Creatinine, Ser: 0.92 mg/dL (ref 0.50–1.35)
GFR calc Af Amer: >90 mL/min (ref >90)
GFR calc non Af Amer: 82 mL/min — ABNORMAL LOW (ref >90)
Glucose, Bld: 121 mg/dL — ABNORMAL HIGH (ref 70–99)
POTASSIUM: 3.7 mmol/L (ref 3.5–5.1)
SODIUM: 135 mmol/L (ref 135–145)
Total Bilirubin: 1.1 mg/dL (ref 0.3–1.2)
Total Protein: 5.8 g/dL — ABNORMAL LOW (ref 6.0–8.3)

## 2014-12-14 LAB — LIPASE, BLOOD: LIPASE: 284 U/L — AB (ref 11–59)

## 2014-12-14 MED ORDER — AMLODIPINE BESYLATE 10 MG PO TABS
10.0000 mg | ORAL_TABLET | Freq: Every day | ORAL | Status: DC
  Administered 2014-12-15 – 2014-12-17 (×3): 10 mg via ORAL
  Filled 2014-12-14 (×3): qty 1

## 2014-12-14 NOTE — Progress Notes (Signed)
[facility] PROGRESS NOTE  Kevin Villegas [id number] DOB: [birth year] DOA: 12/12/2014 PCP: No primary care provider on file.  Assessment/Plan: 1. Suspected gallstone pancreatitis -Patient denies history of alcohol abuse, with fasting lipid panel showing triglycerides of 66.  -CT scan of abdomen and pelvis showed a pancreatitis along with evidence of cholelithiasis. -I suspect this represents gallstone pancreatitis for which general surgery was consulted -Lipase levels trending down to 284, correlating with significant clinical improvement -Case discussed with general surgery will likely be taken to the OR in the next 24-48 hours for cholecystectomy. Made nothing by mouth after midnight  2.  Hyponatremia. -Improved, labs stable sodium of 135  3.  Elevated transaminases -Transaminase levels trending down, a.m. labs showing AST of 89 with ALT of 128, alkaline phosphatase 76 and total bilirubin 1.1  4.  Hypertension -Blood pressures remain elevated with systolic blood pressures in the 160s, will increase Norvasc dose to 10 mg by mouth daily  Code Status: Full code Family Communication:  Disposition Plan: Anticipate discharge home when medically stable   Consultants:  General surgery   HPI/Subjective: Patient is a pleasant 73 year old gentleman withthickened past medical history who was admitted to the medicine service on 12/12/2014 presenting with severe abdominal discomfort progressively worsening over the past 2 days. He is highly functioning, denies alcohol abuse and states having an overall healthy diet. Lab work revealed a lipase level greater than 3000. He had a CT scan of abdomen and pelvis showing findings compatible with acute pancreatitis. Also noted was a presence of cholelithiasis, without evidence of acute cholecystitis. Patient was admitted to [facility], made nothing by mouth, started on IV fluids, narcotic analgesics for pain control. Case was discussed with general  surgery regarding the possibility of gallstone pancreatitis.  Objective: Filed Vitals:   12/14/14 1340  BP: 157/79  Pulse: 70  Temp: 98.7 F (37.1 C)  Resp: 16    Intake/Output Summary (Last 24 hours) at 12/14/14 1358 Last data filed at 12/14/14 1340  Gross per 24 hour  Intake   2319 ml  Output   2350 ml  Net    -31 ml   Filed Weights   12/12/14 1736  Weight: 86.183 kg (190 lb)    Exam:   General:  Patient is in no acute distress, reports feeling better awake and alert  Cardiovascular: Regular rate and rhythm normal S1-S2 no murmurs rubs or gallops  Respiratory: Normal respiratory effort, lungs are clear to auscultation bilaterally  Abdomen: His abdomen is soft, mild tenderness to palpation over epigastric region, positive bowel sounds across all 4 quadrants  Musculoskeletal: No edema  Data Reviewed: Basic Metabolic Panel:  Recent Labs Lab 12/12/14 1350 12/12/14 1955 12/13/14 0420 12/14/14 0640  NA 132*  --  135 135  K 4.3  --  3.9 3.7  CL 100  --  102 104  CO2 22  --  25 25  GLUCOSE 123*  --  124* 121*  BUN 13  --  7 5*  CREATININE 1.00  --  1.01 0.92  CALCIUM 9.0  --  8.5 8.3*  MG  --  2.1  --   --   PHOS  --  2.5  --   --    Liver Function Tests:  Recent Labs Lab 12/12/14 1350 12/14/14 0640  AST 300* 89*  ALT 212* 128*  ALKPHOS 67 76  BILITOT 2.8* 1.1  PROT 6.7 5.8*  ALBUMIN 3.8 2.9*    Recent Labs Lab 12/12/14 1350 12/14/14 0640  LIPASE >3000* 284*   No results for input(s): AMMONIA in the last 168 hours. CBC:  Recent Labs Lab 12/12/14 1350 12/13/14 0420 12/14/14 0640  WBC 8.9 8.5 8.8  NEUTROABS 7.4  --   --   HGB 15.4 13.7 12.9*  HCT 45.2 41.0 38.6*  MCV 91.5 92.6 93.2  PLT 191 193 192   Cardiac Enzymes: No results for input(s): CKTOTAL, CKMB, CKMBINDEX, TROPONINI in the last 168 hours. BNP (last 3 results) No results for input(s): PROBNP in the last 8760 hours. CBG:  Recent Labs Lab 12/13/14 1933 12/13/14 2335  12/14/14 0402 12/14/14 0738 12/14/14 1140  GLUCAP 113* 111* 117* 130* 105*    No results found for this or any previous visit (from the past 240 hour(s)).   Studies: Ct Abdomen Pelvis W Contrast  12/12/2014   CLINICAL DATA:  Abdominal pain.  Initial encounter.  EXAM: CT ABDOMEN AND PELVIS WITH CONTRAST  TECHNIQUE: Multidetector CT imaging of the abdomen and pelvis was performed using the standard protocol following bolus administration of intravenous contrast.  CONTRAST:  168mL OMNIPAQUE IOHEXOL 300 MG/ML  SOLN  COMPARISON:  None.  FINDINGS: Stranding throughout the parenchyma of the pancreas compatible with acute pancreatitis. No localized fluid collection about the pancreas. There is stranding about the retroperitoneal fat. No evidence of pancreatic hemorrhage or necrosis.  Several gallstones are noted  Several hypodensities throughout the liver are likely small cysts.  Adrenal glands and spleen are within normal limits. Bilateral benign-appearing renal cysts with cortical atrophy. Some hypodensities in the kidneys are too small to characterize  Moderate aortic valvular calcification.  Bladder is distended.  Unremarkable prostate  Extensive sigmoid diverticulosis. Moderate diverticulosis throughout the remainder of the colon.  Free fluid in the right lower quadrant, lesser sac, and pelvis likely related to pancreatitis.  Advanced degenerative disc disease in the lumbar spine.  IMPRESSION: Findings compatible with acute pancreatitis. No evidence of necrosis or hemorrhage. No acute pancreatic fluid collection.  Cholelithiasis.   Electronically Signed   By: Maryclare Bean M.D.   On: 12/12/2014 15:54    Scheduled Meds: . amLODipine  5 mg Oral Daily  . antiseptic oral rinse  7 mL Mouth Rinse q12n4p  . chlorhexidine  15 mL Mouth Rinse BID  . heparin  5,000 Units Subcutaneous 3 times per day   Continuous Infusions: . dextrose 5 % and 0.9% NaCl 125 mL/hr at 12/14/14 0531    Active Problems:   Acute  pancreatitis    Time spent: 25 min    Kevin Villegas  [facility] Pager [phone]. If 7PM-7AM, please contact night-coverage at [email or web address], password [facility] 12/14/2014, 1:58 PM  LOS: 2 days

## 2014-12-14 NOTE — Progress Notes (Signed)
  Subjective: Feeling better. Pain a good deal less. No nausea or vomiting. Passing flatus. No stool. Ambulating in halls. Lipase down to 284. AST 89. ALT 128. Bilirubin 1.1. WBC 8800. Hemoglobin 12.9.  Objective: Vital signs in last 24 hours: Temp:  [98.6 F (37 C)-99.2 F (37.3 C)] 99.2 F (37.3 C) (12/27 0433) Pulse Rate:  [69-72] 72 (12/27 0433) Resp:  [17-18] 17 (12/27 0433) BP: (148-162)/(80-85) 154/81 mmHg (12/27 0433) SpO2:  [95 %-96 %] 96 % (12/27 0433) Last BM Date: 12/12/14  Intake/Output from previous day: 12/26 0701 - 12/27 0700 In: 2679 [P.O.:900; I.V.:1779] Out: 2500 [Urine:2500] Intake/Output this shift:    General appearance: Alert. Pleasant. Cooperative. Nontoxic. Mental status normal. Resp: clear to auscultation bilaterally GI: Abdomen soft. Borderline distended. Minimally tender in epigastrium.  Lab Results:   Recent Labs  12/13/14 0420 12/14/14 0640  WBC 8.5 8.8  HGB 13.7 12.9*  HCT 41.0 38.6*  PLT 193 192   BMET  Recent Labs  12/13/14 0420 12/14/14 0640  NA 135 135  K 3.9 3.7  CL 102 104  CO2 25 25  GLUCOSE 124* 121*  BUN 7 5*  CREATININE 1.01 0.92  CALCIUM 8.5 8.3*   PT/INR No results for input(s): LABPROT, INR in the last 72 hours. ABG No results for input(s): PHART, HCO3 in the last 72 hours.  Invalid input(s): PCO2, PO2  Studies/Results: Ct Abdomen Pelvis W Contrast  12/12/2014   CLINICAL DATA:  Abdominal pain.  Initial encounter.  EXAM: CT ABDOMEN AND PELVIS WITH CONTRAST  TECHNIQUE: Multidetector CT imaging of the abdomen and pelvis was performed using the standard protocol following bolus administration of intravenous contrast.  CONTRAST:  166mL OMNIPAQUE IOHEXOL 300 MG/ML  SOLN  COMPARISON:  None.  FINDINGS: Stranding throughout the parenchyma of the pancreas compatible with acute pancreatitis. No localized fluid collection about the pancreas. There is stranding about the retroperitoneal fat. No evidence of pancreatic  hemorrhage or necrosis.  Several gallstones are noted  Several hypodensities throughout the liver are likely small cysts.  Adrenal glands and spleen are within normal limits. Bilateral benign-appearing renal cysts with cortical atrophy. Some hypodensities in the kidneys are too small to characterize  Moderate aortic valvular calcification.  Bladder is distended.  Unremarkable prostate  Extensive sigmoid diverticulosis. Moderate diverticulosis throughout the remainder of the colon.  Free fluid in the right lower quadrant, lesser sac, and pelvis likely related to pancreatitis.  Advanced degenerative disc disease in the lumbar spine.  IMPRESSION: Findings compatible with acute pancreatitis. No evidence of necrosis or hemorrhage. No acute pancreatic fluid collection.  Cholelithiasis.   Electronically Signed   By: Maryclare Bean Villegas.D.   On: 12/12/2014 15:54    Anti-infectives: Anti-infectives    None      Assessment/Plan:  Gallstone pancreatitis. Resolving chemically and clinically.  CT shows some stranding around the pancreas but the pancreas is well perfused. Several gallstones are noted but the gallbladder does not look inflamed. Continue IV hydration. Allow clear liquids Nothing by mouth after midnight  check labs tomorrow He may be a candidate for cholecystectomy in the next 24-48 hours. I discussed this with him and he is in agreement.   LOS: 2 days    Kevin Villegas 12/14/2014

## 2014-12-15 ENCOUNTER — Inpatient Hospital Stay (HOSPITAL_COMMUNITY): Payer: Medicare Other

## 2014-12-15 DIAGNOSIS — E876 Hypokalemia: Secondary | ICD-10-CM

## 2014-12-15 LAB — COMPREHENSIVE METABOLIC PANEL
ALT: 156 U/L — ABNORMAL HIGH (ref 0–53)
AST: 132 U/L — ABNORMAL HIGH (ref 0–37)
Albumin: 3 g/dL — ABNORMAL LOW (ref 3.5–5.2)
Alkaline Phosphatase: 117 U/L (ref 39–117)
Anion gap: 7 (ref 5–15)
BUN: 7 mg/dL (ref 6–23)
CALCIUM: 8.6 mg/dL (ref 8.4–10.5)
CO2: 28 mmol/L (ref 19–32)
CREATININE: 1.03 mg/dL (ref 0.50–1.35)
Chloride: 105 mEq/L (ref 96–112)
GFR calc non Af Amer: 71 mL/min — ABNORMAL LOW (ref >90)
GFR, EST AFRICAN AMERICAN: 82 mL/min — AB (ref >90)
GLUCOSE: 89 mg/dL (ref 70–99)
Potassium: 3.3 mmol/L — ABNORMAL LOW (ref 3.5–5.1)
Sodium: 140 mmol/L (ref 135–145)
Total Bilirubin: 2.3 mg/dL — ABNORMAL HIGH (ref 0.3–1.2)
Total Protein: 6.5 g/dL (ref 6.0–8.3)

## 2014-12-15 LAB — CBC
HCT: 41.4 % (ref 39.0–52.0)
Hemoglobin: 13.9 g/dL (ref 13.0–17.0)
MCH: 31.4 pg (ref 26.0–34.0)
MCHC: 33.6 g/dL (ref 30.0–36.0)
MCV: 93.7 fL (ref 78.0–100.0)
Platelets: 270 10*3/uL (ref 150–400)
RBC: 4.42 MIL/uL (ref 4.22–5.81)
RDW: 13.1 % (ref 11.5–15.5)
WBC: 8.3 10*3/uL (ref 4.0–10.5)

## 2014-12-15 LAB — GLUCOSE, CAPILLARY
GLUCOSE-CAPILLARY: 92 mg/dL (ref 70–99)
Glucose-Capillary: 107 mg/dL — ABNORMAL HIGH (ref 70–99)
Glucose-Capillary: 103 mg/dL — ABNORMAL HIGH (ref 70–99)
Glucose-Capillary: 88 mg/dL (ref 70–99)
Glucose-Capillary: 129 mg/dL — ABNORMAL HIGH (ref 70–99)

## 2014-12-15 LAB — SURGICAL PCR SCREEN
MRSA, PCR: NEGATIVE
Staphylococcus aureus: POSITIVE — AB

## 2014-12-15 LAB — LIPASE, BLOOD: LIPASE: 172 U/L — AB (ref 11–59)

## 2014-12-15 MED ORDER — MUPIROCIN 2 % EX OINT
1.0000 "application " | TOPICAL_OINTMENT | Freq: Two times a day (BID) | CUTANEOUS | Status: DC
  Administered 2014-12-15 – 2014-12-17 (×4): 1 via NASAL
  Filled 2014-12-15: qty 22

## 2014-12-15 MED ORDER — CHLORHEXIDINE GLUCONATE CLOTH 2 % EX PADS
6.0000 | MEDICATED_PAD | Freq: Every day | CUTANEOUS | Status: DC
  Administered 2014-12-15: 6 via TOPICAL

## 2014-12-15 MED ORDER — POTASSIUM CHLORIDE CRYS ER 20 MEQ PO TBCR
40.0000 meq | EXTENDED_RELEASE_TABLET | Freq: Once | ORAL | Status: AC
  Administered 2014-12-15: 40 meq via ORAL
  Filled 2014-12-15: qty 2

## 2014-12-15 NOTE — Progress Notes (Signed)
[facility] Progress Note     Subjective: Pt doing well has no pain N/V now.  Last night had severe RUQ epigastric abdominal pain which lasted for >1hr and resolved without any interventions.  Tolerating clears well.  Urinating well.  Ambulating through the halls.    Objective: Vital signs in last 24 hours: Temp:  [98.3 F (36.8 C)-98.7 F (37.1 C)] 98.3 F (36.8 C) (12/28 0607) Pulse Rate:  [66-70] 68 (12/28 0607) Resp:  [16-17] 17 (12/28 0607) BP: (138-157)/(71-79) 142/74 mmHg (12/28 0607) SpO2:  [96 %-97 %] 97 % (12/28 0607) Last BM Date: 12/12/14  Intake/Output from previous day: 12/27 0701 - 12/28 0700 In: 1835.9 [P.O.:638; I.V.:1197.9] Out: 1900 [Urine:1900] Intake/Output this shift:    PE: Gen:  Alert, NAD, pleasant Abd: Soft, NT/ND, +BS, no HSM   Lab Results:   Recent Labs  12/13/14 0420 12/14/14 0640  WBC 8.5 8.8  HGB 13.7 12.9*  HCT 41.0 38.6*  PLT 193 192   BMET  Recent Labs  12/13/14 0420 12/14/14 0640  NA 135 135  K 3.9 3.7  CL 102 104  CO2 25 25  GLUCOSE 124* 121*  BUN 7 5*  CREATININE 1.01 0.92  CALCIUM 8.5 8.3*   PT/INR No results for input(s): LABPROT, INR in the last 72 hours. CMP     Component Value Date/Time   NA 135 12/14/2014 0640   K 3.7 12/14/2014 0640   CL 104 12/14/2014 0640   CO2 25 12/14/2014 0640   GLUCOSE 121* 12/14/2014 0640   BUN 5* 12/14/2014 0640   CREATININE 0.92 12/14/2014 0640   CALCIUM 8.3* 12/14/2014 0640   PROT 5.8* 12/14/2014 0640   ALBUMIN 2.9* 12/14/2014 0640   AST 89* 12/14/2014 0640   ALT 128* 12/14/2014 0640   ALKPHOS 76 12/14/2014 0640   BILITOT 1.1 12/14/2014 0640   GFRNONAA 82* 12/14/2014 0640   GFRAA >90 12/14/2014 0640   Lipase     Component Value Date/Time   LIPASE 284* 12/14/2014 0640       Studies/Results: No results found.  Anti-infectives: Anti-infectives    None       Assessment/Plan Gallstone pancreatitis - Improving labs and clinically.    Plan: 1.  CT shows some stranding around the pancreas but the pancreas is well perfused. Several gallstones are noted but the gallbladder does not look inflamed. 2.  Continue IV hydration. 3.  Allow clear liquids, NPO after midnight, hold heparin after MN 4.  Check labs today - still pending 5.  Lap chole likely tomorrow if labs look good today    LOS: 3 days    DORT, Kevin Villegas 12/15/2014, 7:45 AM Pager: [phone]

## 2014-12-15 NOTE — Anesthesia Preprocedure Evaluation (Addendum)
Anesthesia Evaluation  Patient identified by MRN, date of birth, ID band Patient awake    Reviewed: Allergy & Precautions, H&P , NPO status , Patient's Chart, lab work & pertinent test results, reviewed documented beta blocker date and time   Airway Mallampati: II  TM Distance: >3 FB Neck ROM: Full    Dental  (+) Teeth Intact   Pulmonary neg pulmonary ROS,          Cardiovascular negative cardio ROS      Neuro/Psych negative neurological ROS     GI/Hepatic negative GI ROS, Gallstone Pancreatitis, LFT elevated   Endo/Other  negative endocrine ROS  Renal/GU negative Renal ROS     Musculoskeletal   Abdominal   Peds  Hematology negative hematology ROS (+)   Anesthesia Other Findings   Reproductive/Obstetrics                            Anesthesia Physical Anesthesia Plan  ASA: II  Anesthesia Plan: General   Post-op Pain Management:    Induction: Intravenous  Airway Management Planned: Oral ETT  Additional Equipment:   Intra-op Plan:   Post-operative Plan: Extubation in OR  Informed Consent: I have reviewed the patients History and Physical, chart, labs and discussed the procedure including the risks, benefits and alternatives for the proposed anesthesia with the patient or authorized representative who has indicated his/her understanding and acceptance.     Plan Discussed with: Anesthesiologist and Surgeon  Anesthesia Plan Comments:        Anesthesia Quick Evaluation

## 2014-12-15 NOTE — Progress Notes (Signed)
[facility] PROGRESS NOTE  Kevin Villegas [id number] DOB: 09/26/1941 DOA: 12/12/2014 PCP: No primary care provider on file.  Assessment/Plan: 1. Suspected gallstone pancreatitis -Patient denies history of alcohol abuse, with fasting lipid panel showing triglycerides of 66.  -CT scan of abdomen and pelvis showed a pancreatitis along with evidence of cholelithiasis. -I suspect this represents gallstone pancreatitis for which general surgery was consulted -Lipase levels trending down to 172 -Case discussed with general surgery will likely be taken to the OR in the next 24 hours.  -May need ERCP if intraoperative cholangiogram positive  2.  Hypokalemia -AM labs showing potassium of 3.3 will give 40 meq of Kdur.   3.  Elevated transaminases -Trending down, repeat CMP in am.   4.  Hypertension -Blood pressures improved with increase in norvasc  Code Status: Full code Family Communication:  Disposition Plan: Anticipate discharge home when medically stable   Consultants:  General surgery   HPI/Subjective: Patient is a pleasant 73 year old gentleman withthickened past medical history who was admitted to the medicine service on 12/12/2014 presenting with severe abdominal discomfort progressively worsening over the past 2 days. He is highly functioning, denies alcohol abuse and states having an overall healthy diet. Lab work revealed a lipase level greater than 3000. He had a CT scan of abdomen and pelvis showing findings compatible with acute pancreatitis. Also noted was a presence of cholelithiasis, without evidence of acute cholecystitis. Patient was admitted to 6[facility], made nothing by mouth, started on IV fluids, narcotic analgesics for pain control. Case was discussed with general surgery regarding the possibility of gallstone pancreatitis.  Objective: Filed Vitals:   12/15/14 1315  BP: 135/77  Pulse: 71  Temp: 98.5 F (36.9 C)  Resp: 17    Intake/Output Summary (Last 24  hours) at 12/15/14 1743 Last data filed at 12/15/14 1400  Gross per 24 hour  Intake   2875 ml  Output   1950 ml  Net    925 ml   Filed Weights   12/12/14 1736  Weight: 86.183 kg (190 lb)    Exam:   General:  Patient is in no acute distress, reports feeling better awake and alert  Cardiovascular: Regular rate and rhythm normal S1-S2 no murmurs rubs or gallops  Respiratory: Normal respiratory effort, lungs are clear to auscultation bilaterally  Abdomen: His abdomen is soft, mild tenderness to palpation over epigastric region, positive bowel sounds across all 4 quadrants  Musculoskeletal: No edema  Data Reviewed: Basic Metabolic Panel:  Recent Labs Lab 12/12/14 1350 12/12/14 1955 12/13/14 0420 12/14/14 0640 12/15/14 0858  NA 132*  --  135 135 140  K 4.3  --  3.9 3.7 3.3*  CL 100  --  102 104 105  CO2 22  --  25 25 28   GLUCOSE 123*  --  124* 121* 89  BUN 13  --  7 5* 7  CREATININE 1.00  --  1.01 0.92 1.03  CALCIUM 9.0  --  8.5 8.3* 8.6  MG  --  2.1  --   --   --   PHOS  --  2.5  --   --   --    Liver Function Tests:  Recent Labs Lab 12/12/14 1350 12/14/14 0640 12/15/14 0858  AST 300* 89* 132*  ALT 212* 128* 156*  ALKPHOS 67 76 117  BILITOT 2.8* 1.1 2.3*  PROT 6.7 5.8* 6.5  ALBUMIN 3.8 2.9* 3.0*    Recent Labs Lab 12/12/14 1350 12/14/14 0640 12/15/14 ID:[id number]  LIPASE >3000* 284* 172*   No results for input(s): AMMONIA in the last 168 hours. CBC:  Recent Labs Lab 12/12/14 1350 12/13/14 0420 12/14/14 0640 12/15/14 0858  WBC 8.9 8.5 8.8 8.3  NEUTROABS 7.4  --   --   --   HGB 15.4 13.7 12.9* 13.9  HCT 45.2 41.0 38.6* 41.4  MCV 91.5 92.6 93.2 93.7  PLT 191 193 192 270   Cardiac Enzymes: No results for input(s): CKTOTAL, CKMB, CKMBINDEX, TROPONINI in the last 168 hours. BNP (last 3 results) No results for input(s): PROBNP in the last 8760 hours. CBG:  Recent Labs Lab 12/14/14 1942 12/14/14 2237 12/15/14 0744 12/15/14 1145 12/15/14 1608   GLUCAP 110* 104* 107* 103* 88    Recent Results (from the past 240 hour(s))  Surgical pcr screen     Status: Abnormal   Collection Time: 12/15/14 11:26 AM  Result Value Ref Range Status   MRSA, PCR NEGATIVE NEGATIVE Final   Staphylococcus aureus POSITIVE (A) NEGATIVE Final    Comment:        The Xpert SA Assay (FDA approved for NASAL specimens in patients over 73 years of age), is one component of a comprehensive surveillance program.  Test performance has been validated by [employer] for patients greater than or equal to 45 year old. It is not intended to diagnose infection nor to guide or monitor treatment.      Studies: No results found.  Scheduled Meds: . amLODipine  10 mg Oral Daily  . antiseptic oral rinse  7 mL Mouth Rinse q12n4p  . chlorhexidine  15 mL Mouth Rinse BID  . Chlorhexidine Gluconate Cloth  6 each Topical Daily  . heparin  5,000 Units Subcutaneous 3 times per day  . mupirocin ointment  1 application Nasal BID  . potassium chloride  40 mEq Oral Once   Continuous Infusions: . dextrose 5 % and 0.9% NaCl 125 mL/hr at 12/15/14 1550    Active Problems:   Acute pancreatitis    Time spent: 25 min    Kelvin Cellar  [facility] Pager [phone]. If 7PM-7AM, please contact night-coverage at [email or web address], password [facility] 12/15/2014, 5:43 PM  LOS: 3 days

## 2014-12-16 ENCOUNTER — Inpatient Hospital Stay (HOSPITAL_COMMUNITY): Payer: Medicare Other | Admitting: Anesthesiology

## 2014-12-16 ENCOUNTER — Inpatient Hospital Stay (HOSPITAL_COMMUNITY): Payer: Medicare Other

## 2014-12-16 ENCOUNTER — Encounter (HOSPITAL_COMMUNITY): Payer: Self-pay | Admitting: Certified Registered"

## 2014-12-16 ENCOUNTER — Encounter (HOSPITAL_COMMUNITY)
Admission: EM | Disposition: A | Discharge status: Home / Self Care | Payer: Self-pay | Source: Home / Self Care | Attending: Internal Medicine

## 2014-12-16 DIAGNOSIS — K8050 Calculus of bile duct without cholangitis or cholecystitis without obstruction: Secondary | ICD-10-CM

## 2014-12-16 HISTORY — PX: CHOLECYSTECTOMY: SHX55

## 2014-12-16 LAB — CBC
HCT: 35.4 % — ABNORMAL LOW (ref 39.0–52.0)
Hemoglobin: 11.7 g/dL — ABNORMAL LOW (ref 13.0–17.0)
MCH: 30.6 pg (ref 26.0–34.0)
MCHC: 33.1 g/dL (ref 30.0–36.0)
MCV: 92.7 fL (ref 78.0–100.0)
PLATELETS: 209 10*3/uL (ref 150–400)
RBC: 3.82 MIL/uL — AB (ref 4.22–5.81)
RDW: 13 % (ref 11.5–15.5)
WBC: 7.2 10*3/uL (ref 4.0–10.5)

## 2014-12-16 LAB — COMPREHENSIVE METABOLIC PANEL
ALT: 94 U/L — ABNORMAL HIGH (ref 0–53)
AST: 53 U/L — ABNORMAL HIGH (ref 0–37)
Albumin: 2.6 g/dL — ABNORMAL LOW (ref 3.5–5.2)
Alkaline Phosphatase: 95 U/L (ref 39–117)
Anion gap: 8 (ref 5–15)
BUN: 6 mg/dL (ref 6–23)
CALCIUM: 8.3 mg/dL — AB (ref 8.4–10.5)
CO2: 23 mmol/L (ref 19–32)
CREATININE: 0.94 mg/dL (ref 0.50–1.35)
Chloride: 107 mEq/L (ref 96–112)
GFR, EST NON AFRICAN AMERICAN: 82 mL/min — AB (ref >90)
Glucose, Bld: 125 mg/dL — ABNORMAL HIGH (ref 70–99)
Potassium: 3.7 mmol/L (ref 3.5–5.1)
Sodium: 138 mmol/L (ref 135–145)
Total Bilirubin: 0.8 mg/dL (ref 0.3–1.2)
Total Protein: 5.2 g/dL — ABNORMAL LOW (ref 6.0–8.3)

## 2014-12-16 LAB — GLUCOSE, CAPILLARY
GLUCOSE-CAPILLARY: 123 mg/dL — AB (ref 70–99)
GLUCOSE-CAPILLARY: 119 mg/dL — AB (ref 70–99)

## 2014-12-16 SURGERY — LAPAROSCOPIC CHOLECYSTECTOMY WITH INTRAOPERATIVE CHOLANGIOGRAM
Anesthesia: General | Site: Abdomen

## 2014-12-16 MED ORDER — IOHEXOL 300 MG/ML  SOLN
INTRAMUSCULAR | Status: DC | PRN
  Administered 2014-12-16: 5 mL

## 2014-12-16 MED ORDER — LIDOCAINE HCL (CARDIAC) 20 MG/ML IV SOLN
INTRAVENOUS | Status: DC | PRN
  Administered 2014-12-16: 50 mg via INTRAVENOUS

## 2014-12-16 MED ORDER — PROPOFOL 10 MG/ML IV BOLUS
INTRAVENOUS | Status: DC | PRN
  Administered 2014-12-16: 30 mg via INTRAVENOUS
  Administered 2014-12-16: 20 mg via INTRAVENOUS
  Administered 2014-12-16: 150 mg via INTRAVENOUS

## 2014-12-16 MED ORDER — OXYCODONE HCL 5 MG PO TABS: 5.0000 mg | ORAL_TABLET | ORAL | Status: DC | PRN

## 2014-12-16 MED ORDER — GLYCOPYRROLATE 0.2 MG/ML IJ SOLN
INTRAMUSCULAR | Status: AC
  Filled 2014-12-16: qty 3

## 2014-12-16 MED ORDER — ACETAMINOPHEN 10 MG/ML IV SOLN
1000.0000 mg | INTRAVENOUS | Status: DC
  Filled 2014-12-16: qty 100

## 2014-12-16 MED ORDER — FENTANYL CITRATE 0.05 MG/ML IJ SOLN
INTRAMUSCULAR | Status: AC
  Filled 2014-12-16: qty 5

## 2014-12-16 MED ORDER — ROCURONIUM BROMIDE 100 MG/10ML IV SOLN
INTRAVENOUS | Status: DC | PRN
  Administered 2014-12-16: 40 mg via INTRAVENOUS

## 2014-12-16 MED ORDER — PROMETHAZINE HCL 25 MG/ML IJ SOLN: 6.2500 mg | INTRAMUSCULAR | Status: DC | PRN

## 2014-12-16 MED ORDER — NEOSTIGMINE METHYLSULFATE 10 MG/10ML IV SOLN
INTRAVENOUS | Status: DC | PRN
  Administered 2014-12-16: 3 mg via INTRAVENOUS

## 2014-12-16 MED ORDER — GLYCOPYRROLATE 0.2 MG/ML IJ SOLN
INTRAMUSCULAR | Status: DC | PRN
  Administered 2014-12-16: 0.4 mg via INTRAVENOUS

## 2014-12-16 MED ORDER — CEFAZOLIN SODIUM-DEXTROSE 2-3 GM-% IV SOLR
INTRAVENOUS | Status: AC
  Filled 2014-12-16: qty 50

## 2014-12-16 MED ORDER — CEFAZOLIN SODIUM-DEXTROSE 2-3 GM-% IV SOLR
INTRAVENOUS | Status: DC | PRN
  Administered 2014-12-16: 2 g via INTRAVENOUS

## 2014-12-16 MED ORDER — 0.9 % SODIUM CHLORIDE (POUR BTL) OPTIME
TOPICAL | Status: DC | PRN
  Administered 2014-12-16: 1000 mL

## 2014-12-16 MED ORDER — LIDOCAINE HCL (CARDIAC) 20 MG/ML IV SOLN
INTRAVENOUS | Status: AC
  Filled 2014-12-16: qty 5

## 2014-12-16 MED ORDER — LACTATED RINGERS IV SOLN
INTRAVENOUS | Status: DC
  Administered 2014-12-16 (×2): via INTRAVENOUS

## 2014-12-16 MED ORDER — ACETAMINOPHEN 10 MG/ML IV SOLN
INTRAVENOUS | Status: DC | PRN
  Administered 2014-12-16: 1000 mg via INTRAVENOUS

## 2014-12-16 MED ORDER — MIDAZOLAM HCL 2 MG/2ML IJ SOLN
INTRAMUSCULAR | Status: AC
  Filled 2014-12-16: qty 2

## 2014-12-16 MED ORDER — ROCURONIUM BROMIDE 50 MG/5ML IV SOLN
INTRAVENOUS | Status: AC
  Filled 2014-12-16: qty 1

## 2014-12-16 MED ORDER — EPHEDRINE SULFATE 50 MG/ML IJ SOLN
INTRAMUSCULAR | Status: DC | PRN
  Administered 2014-12-16: 10 mg via INTRAVENOUS

## 2014-12-16 MED ORDER — BUPIVACAINE-EPINEPHRINE (PF) 0.25% -1:200000 IJ SOLN
INTRAMUSCULAR | Status: AC
  Filled 2014-12-16: qty 30

## 2014-12-16 MED ORDER — BUPIVACAINE-EPINEPHRINE 0.25% -1:200000 IJ SOLN
INTRAMUSCULAR | Status: DC | PRN
  Administered 2014-12-16: 10 mL

## 2014-12-16 MED ORDER — MIDAZOLAM HCL 5 MG/5ML IJ SOLN
INTRAMUSCULAR | Status: DC | PRN
  Administered 2014-12-16: 2 mg via INTRAVENOUS

## 2014-12-16 MED ORDER — FENTANYL CITRATE 0.05 MG/ML IJ SOLN: 25.0000 ug | INTRAMUSCULAR | Status: DC | PRN

## 2014-12-16 MED ORDER — SODIUM CHLORIDE 0.9 % IR SOLN
Status: DC | PRN
  Administered 2014-12-16: 1

## 2014-12-16 MED ORDER — DEXAMETHASONE SODIUM PHOSPHATE 4 MG/ML IJ SOLN
INTRAMUSCULAR | Status: AC
  Filled 2014-12-16: qty 2

## 2014-12-16 MED ORDER — MEPERIDINE HCL 25 MG/ML IJ SOLN: 6.2500 mg | INTRAMUSCULAR | Status: DC | PRN

## 2014-12-16 MED ORDER — ONDANSETRON HCL 4 MG/2ML IJ SOLN
INTRAMUSCULAR | Status: AC
  Filled 2014-12-16: qty 2

## 2014-12-16 MED ORDER — DEXAMETHASONE SODIUM PHOSPHATE 4 MG/ML IJ SOLN
INTRAMUSCULAR | Status: DC | PRN
  Administered 2014-12-16: 8 mg via INTRAVENOUS

## 2014-12-16 MED ORDER — PHENYLEPHRINE HCL 10 MG/ML IJ SOLN
INTRAMUSCULAR | Status: DC | PRN
  Administered 2014-12-16 (×3): 80 ug via INTRAVENOUS

## 2014-12-16 MED ORDER — ONDANSETRON HCL 4 MG/2ML IJ SOLN
INTRAMUSCULAR | Status: DC | PRN
  Administered 2014-12-16: 4 mg via INTRAVENOUS

## 2014-12-16 MED ORDER — FENTANYL CITRATE 0.05 MG/ML IJ SOLN
INTRAMUSCULAR | Status: DC | PRN
  Administered 2014-12-16 (×2): 50 ug via INTRAVENOUS
  Administered 2014-12-16: 100 ug via INTRAVENOUS
  Administered 2014-12-16: 50 ug via INTRAVENOUS

## 2014-12-16 SURGICAL SUPPLY — 41 items
APPLIER CLIP 5 13 M/L LIGAMAX5 (MISCELLANEOUS) ×3
BLADE SURG ROTATE 9660 (MISCELLANEOUS) IMPLANT
CANISTER SUCTION 2500CC (MISCELLANEOUS) ×3 IMPLANT
CHLORAPREP W/TINT 26ML (MISCELLANEOUS) ×3 IMPLANT
CLIP APPLIE 5 13 M/L LIGAMAX5 (MISCELLANEOUS) ×1 IMPLANT
COVER MAYO STAND STRL (DRAPES) ×3 IMPLANT
COVER SURGICAL LIGHT HANDLE (MISCELLANEOUS) ×3 IMPLANT
DRAPE C-ARM 42X72 X-RAY (DRAPES) ×3 IMPLANT
DRAPE LAPAROSCOPIC ABDOMINAL (DRAPES) ×3 IMPLANT
ELECT REM PT RETURN 9FT ADLT (ELECTROSURGICAL) ×3
ELECTRODE REM PT RTRN 9FT ADLT (ELECTROSURGICAL) ×1 IMPLANT
FILTER SMOKE EVAC LAPAROSHD (FILTER) IMPLANT
GLOVE BIO SURGEON STRL SZ8 (GLOVE) ×3 IMPLANT
GLOVE BIOGEL PI IND STRL 7.0 (GLOVE) ×1 IMPLANT
GLOVE BIOGEL PI IND STRL 8 (GLOVE) ×1 IMPLANT
GLOVE BIOGEL PI INDICATOR 7.0 (GLOVE) ×2
GLOVE BIOGEL PI INDICATOR 8 (GLOVE) ×2
GLOVE SURG SS PI 7.0 STRL IVOR (GLOVE) ×6 IMPLANT
GOWN STRL REUS W/ TWL LRG LVL3 (GOWN DISPOSABLE) ×2 IMPLANT
GOWN STRL REUS W/ TWL XL LVL3 (GOWN DISPOSABLE) ×1 IMPLANT
GOWN STRL REUS W/TWL LRG LVL3 (GOWN DISPOSABLE) ×4
GOWN STRL REUS W/TWL XL LVL3 (GOWN DISPOSABLE) ×2
KIT BASIN OR (CUSTOM PROCEDURE TRAY) ×3 IMPLANT
KIT ROOM TURNOVER OR (KITS) ×3 IMPLANT
LIQUID BAND (GAUZE/BANDAGES/DRESSINGS) ×3 IMPLANT
NS IRRIG 1000ML POUR BTL (IV SOLUTION) ×3 IMPLANT
PAD ARMBOARD 7.5X6 YLW CONV (MISCELLANEOUS) ×3 IMPLANT
POUCH RETRIEVAL ECOSAC 10 (ENDOMECHANICALS) ×1 IMPLANT
POUCH RETRIEVAL ECOSAC 10MM (ENDOMECHANICALS) ×2
SCISSORS LAP 5X35 DISP (ENDOMECHANICALS) ×3 IMPLANT
SET CHOLANGIOGRAPH 5 50 .035 (SET/KITS/TRAYS/PACK) ×3 IMPLANT
SET IRRIG TUBING LAPAROSCOPIC (IRRIGATION / IRRIGATOR) ×3 IMPLANT
SLEEVE ENDOPATH XCEL 5M (ENDOMECHANICALS) ×6 IMPLANT
SPECIMEN JAR SMALL (MISCELLANEOUS) ×3 IMPLANT
SUT VIC AB 4-0 PS2 27 (SUTURE) ×3 IMPLANT
TOWEL OR 17X24 6PK STRL BLUE (TOWEL DISPOSABLE) ×3 IMPLANT
TOWEL OR 17X26 10 PK STRL BLUE (TOWEL DISPOSABLE) ×3 IMPLANT
TRAY LAPAROSCOPIC (CUSTOM PROCEDURE TRAY) ×3 IMPLANT
TROCAR XCEL BLUNT TIP 100MML (ENDOMECHANICALS) ×3 IMPLANT
TROCAR XCEL NON-BLD 5MMX100MML (ENDOMECHANICALS) ×3 IMPLANT
TUBING INSUFFLATION (TUBING) ×3 IMPLANT

## 2014-12-16 NOTE — Progress Notes (Signed)
Day of Surgery  Subjective: better  Objective: Vital signs in last 24 hours: Temp:  [98.5 F (36.9 C)-98.9 F (37.2 C)] 98.9 F (37.2 C) (12/29 0504) Pulse Rate:  [68-72] 68 (12/29 0504) Resp:  [16-17] 16 (12/29 0504) BP: (134-152)/(65-77) 134/65 mmHg (12/29 0504) SpO2:  [95 %-99 %] 95 % (12/29 0504) Last BM Date: 12/12/14  Intake/Output from previous day: 12/28 0701 - 12/29 0700 In: 4365 [P.O.:240; I.V.:4125] Out: 2475 [Urine:2475] Intake/Output this shift:    General appearance: cooperative Resp: clear to auscultation bilaterally Cardio: regular rate and rhythm GI: soft, minimal upper abd TTP  Lab Results:   Recent Labs  12/15/14 0858 12/16/14 0459  WBC 8.3 7.2  HGB 13.9 11.7*  HCT 41.4 35.4*  PLT 270 209   BMET  Recent Labs  12/15/14 0858 12/16/14 0459  NA 140 138  K 3.3* 3.7  CL 105 107  CO2 28 23  GLUCOSE 89 125*  BUN 7 6  CREATININE 1.03 0.94  CALCIUM 8.6 8.3*   PT/INR No results for input(s): LABPROT, INR in the last 72 hours. ABG No results for input(s): PHART, HCO3 in the last 72 hours.  Invalid input(s): PCO2, PO2  Studies/Results: US Abdomen Limited Ruq  12/15/2014   CLINICAL DATA:  Evaluate for common bile duct stone or biliary ductal enlargement. Pancreatitis.  EXAM: US ABDOMEN LIMITED - RIGHT UPPER QUADRANT  COMPARISON:  CT 12/12/2014  FINDINGS: Gallbladder:  The gallbladder lumen is full of echogenic shadowing gallstones. No gallbladder wall thickening. No pericholecystic fluid. Negative sonographic Murphy sign.  Common bile duct:  Diameter: 4 mm  Liver:  Multiple cysts are demonstrated within the liver, the largest of which measures 2 cm within the right hepatic lobe.  IMPRESSION: Cholelithiasis without sonographic evidence for acute cholecystitis.   Electronically Signed   By: Lovey Newcomer M.D.   On: 12/15/2014 20:57    Anti-infectives: Anti-infectives    None      Assessment/Plan: s/p Procedure(s): LAPAROSCOPIC  CHOLECYSTECTOMY WITH INTRAOPERATIVE CHOLANGIOGRAM (N/A) biliary pancreatitis and lokely spontaneouslt resolved choledocholithiasis - for lap chole/IOC today. Procedure risks and benefits D/W him and his wife. He agrees.  LOS: 4 days    Kloie Whiting E 12/16/2014

## 2014-12-16 NOTE — Anesthesia Procedure Notes (Signed)
Procedure Name: Intubation Date/Time: 12/16/2014 10:10 AM Performed by: Julian Reil Pre-anesthesia Checklist: Patient identified, Emergency Drugs available, Suction available and Patient being monitored Patient Re-evaluated:Patient Re-evaluated prior to inductionOxygen Delivery Method: Circle system utilized Preoxygenation: Pre-oxygenation with 100% oxygen Intubation Type: IV induction Ventilation: Mask ventilation without difficulty Laryngoscope Size: Mac and 4 Grade View: Grade II Tube type: Oral Tube size: 7.5 mm Number of attempts: 1 Airway Equipment and Method: Stylet Placement Confirmation: ETT inserted through vocal cords under direct vision,  positive ETCO2 and breath sounds checked- equal and bilateral Secured at: 22 cm Tube secured with: Tape Dental Injury: Teeth and Oropharynx as per pre-operative assessment

## 2014-12-16 NOTE — Progress Notes (Signed)
[facility] PROGRESS NOTE  Kevin Villegas [id number] DOB: [birth year] DOA: 12/12/2014 PCP: No primary care provider on file.   Interim Summary Patient is a pleasant 73 year old gentleman with no significant past medical history who was admitted to the medicine service on 12/12/2014 presenting with severe abdominal discomfort progressively worsening over the past 2 days. He is highly functioning, denies alcohol abuse and states having an overall healthy diet. Lab work revealed a lipase level greater than 3000. He had a CT scan of abdomen and pelvis showing findings compatible with acute pancreatitis. Also noted was a presence of cholelithiasis, without evidence of acute cholecystitis. Patient was admitted to 6[facility], made nothing by mouth, started on IV fluids, narcotic analgesics for pain control. Case was discussed with general surgery regarding the possibility of gallstone pancreatitis. His lipase levels gradually trended down.  On 12/16/2014 he was taken to the OR where he underwent laparoscopic cholecystectomy procedure performed by Dr. Grandville Silos of general surgery. During procedure he was found to have a common bile duct stone which was flushed successfully during cholangiogram. Patient tolerated procedure were there no immediate complications.  Assessment/Plan: 1. Suspected gallstone pancreatitis -Patient denies history of alcohol abuse, with fasting lipid panel showing triglycerides of 66.  -CT scan of abdomen and pelvis showed a pancreatitis along with evidence of cholelithiasis. -I suspected this represents gallstone pancreatitis for which general surgery was consulted -Patient undergoing laparoscopic cholecystectomy performed on 12/16/2014, tolerated procedure well. -During procedure he was found to have a common bile duct stone which was flushed successfully during cholangiogram  2.  Hypokalemia -Resolved after the menstruation of potassium chloride -A.m. labs showing potassium  3.7  3.  Elevated transaminases -Normalizing, slightly secondary to biliary disease  4.  Hypertension -Blood pressures improved with increase in norvasc  Code Status: Full code Family Communication: Spoke with his wife and daughter present at bedside Disposition Plan: Anticipate discharge home in the next 24 hours   Consultants:  General surgery   HPI/Subjective: Patient seen after procedure, reports having pain to right upper quadrant, denies fevers chills, nausea vomiting  Objective: Filed Vitals:   12/16/14 1210  BP: 154/81  Pulse: 58  Temp: 98.7 F (37.1 C)  Resp: 16    Intake/Output Summary (Last 24 hours) at 12/16/14 1322 Last data filed at 12/16/14 1303  Gross per 24 hour  Intake   5765 ml  Output   2825 ml  Net   2940 ml   Filed Weights   12/12/14 1736  Weight: 86.183 kg (190 lb)    Exam:   General:  Patient is in no acute distress, reports feeling better awake and alert  Cardiovascular: Regular rate and rhythm normal S1-S2 no murmurs rubs or gallops  Respiratory: Normal respiratory effort, lungs are clear to auscultation bilaterally  Abdomen: His abdomen is soft, mild tenderness to palpation over epigastric region, positive bowel sounds across all 4 quadrants  Musculoskeletal: No edema  Data Reviewed: Basic Metabolic Panel:  Recent Labs Lab 12/12/14 1350 12/12/14 1955 12/13/14 0420 12/14/14 0640 12/15/14 0858 12/16/14 0459  NA 132*  --  135 135 140 138  K 4.3  --  3.9 3.7 3.3* 3.7  CL 100  --  102 104 105 107  CO2 22  --  25 25 28 23   GLUCOSE 123*  --  124* 121* 89 125*  BUN 13  --  7 5* 7 6  CREATININE 1.00  --  1.01 0.92 1.03 0.94  CALCIUM 9.0  --  8.5 8.3*  8.6 8.3*  MG  --  2.1  --   --   --   --   PHOS  --  2.5  --   --   --   --    Liver Function Tests:  Recent Labs Lab 12/12/14 1350 12/14/14 0640 12/15/14 0858 12/16/14 0459  AST 300* 89* 132* 53*  ALT 212* 128* 156* 94*  ALKPHOS 67 76 117 95  BILITOT 2.8* 1.1 2.3*  0.8  PROT 6.7 5.8* 6.5 5.2*  ALBUMIN 3.8 2.9* 3.0* 2.6*    Recent Labs Lab 12/12/14 1350 12/14/14 0640 12/15/14 0858  LIPASE >3000* 284* 172*   No results for input(s): AMMONIA in the last 168 hours. CBC:  Recent Labs Lab 12/12/14 1350 12/13/14 0420 12/14/14 0640 12/15/14 0858 12/16/14 0459  WBC 8.9 8.5 8.8 8.3 7.2  NEUTROABS 7.4  --   --   --   --   HGB 15.4 13.7 12.9* 13.9 11.7*  HCT 45.2 41.0 38.6* 41.4 35.4*  MCV 91.5 92.6 93.2 93.7 92.7  PLT 191 193 192 270 209   Cardiac Enzymes: No results for input(s): CKTOTAL, CKMB, CKMBINDEX, TROPONINI in the last 168 hours. BNP (last 3 results) No results for input(s): PROBNP in the last 8760 hours. CBG:  Recent Labs Lab 12/15/14 1608 12/15/14 2108 12/15/14 2328 12/16/14 0348 12/16/14 0755  GLUCAP 88 92 129* 123* 119*    Recent Results (from the past 240 hour(s))  Surgical pcr screen     Status: Abnormal   Collection Time: 12/15/14 11:26 AM  Result Value Ref Range Status   MRSA, PCR NEGATIVE NEGATIVE Final   Staphylococcus aureus POSITIVE (A) NEGATIVE Final    Comment:        The Xpert SA Assay (FDA approved for NASAL specimens in patients over 25 years of age), is one component of a comprehensive surveillance program.  Test performance has been validated by [employer] for patients greater than or equal to 66 year old. It is not intended to diagnose infection nor to guide or monitor treatment.      Studies: Dg Cholangiogram Operative  12/16/2014   CLINICAL DATA:  Gallstones.  EXAM: INTRAOPERATIVE CHOLANGIOGRAM  FLUOROSCOPY TIME:  10 seconds  COMPARISON:  CT abdomen pelvis- 12/12/2014  FINDINGS: Intraoperative angiographic images of the right upper abdominal quadrant during laparoscopic cholecystectomy are provided for review.  Surgical clips overlie the expected location of the gallbladder fossa.  Contrast injection demonstrates selective cannulation of the central aspect of the cystic duct.  There is  passage of contrast through the central aspect of the cystic duct with filling of a non dilated common bile duct. There is passage of contrast though the CBD and into the descending portion of the duodenum.  There is minimal reflux of injected contrast into the common hepatic duct and central aspect of the non dilated intrahepatic biliary system.  There is a minimal amounts of debris within the CBD as well as the proper hepatic duct. Additionally, there is a persistent nonocclusive filling defect within the mid aspect of the CBD which may represent a nonocclusive gallstone. A potential air bubble is seen within the central aspect of the right intrahepatic biliary system.  There is very minimal apparent opacification of the central most aspect of the pancreatic duct.  IMPRESSION: 1. Suspected debris / sludge within the common bile and proper hepatic ducts. Additionally, there is a potential nonocclusive stone within the mid aspect of the CBD. Correlation with the operative report is  recommended. Further evaluation could be performed with MRCP and/or ERCP as clinically indicated. 2. Minimal apparent opacification of the central most aspect of the pancreatic duct.   Electronically Signed   By: Sandi Mariscal M.D.   On: 12/16/2014 11:18   US Abdomen Limited Ruq  12/15/2014   CLINICAL DATA:  Evaluate for common bile duct stone or biliary ductal enlargement. Pancreatitis.  EXAM: US ABDOMEN LIMITED - RIGHT UPPER QUADRANT  COMPARISON:  CT 12/12/2014  FINDINGS: Gallbladder:  The gallbladder lumen is full of echogenic shadowing gallstones. No gallbladder wall thickening. No pericholecystic fluid. Negative sonographic Murphy sign.  Common bile duct:  Diameter: 4 mm  Liver:  Multiple cysts are demonstrated within the liver, the largest of which measures 2 cm within the right hepatic lobe.  IMPRESSION: Cholelithiasis without sonographic evidence for acute cholecystitis.   Electronically Signed   By: Lovey Newcomer M.D.   On:  12/15/2014 20:57    Scheduled Meds: . amLODipine  10 mg Oral Daily  . antiseptic oral rinse  7 mL Mouth Rinse q12n4p  . chlorhexidine  15 mL Mouth Rinse BID  . Chlorhexidine Gluconate Cloth  6 each Topical Daily  . heparin  5,000 Units Subcutaneous 3 times per day  . mupirocin ointment  1 application Nasal BID   Continuous Infusions: . dextrose 5 % and 0.9% NaCl 125 mL/hr at 12/16/14 0055  . lactated ringers 50 mL/hr at 12/16/14 0901    Active Problems:   Acute pancreatitis    Time spent: 25 min    Kelvin Cellar  [facility] Pager [phone]. If 7PM-7AM, please contact night-coverage at [email or web address], password [facility] 12/16/2014, 1:22 PM  LOS: 4 days

## 2014-12-16 NOTE — Transfer of Care (Signed)
Immediate Anesthesia Transfer of Care Note  Patient: Kevin Villegas  Procedure(s) Performed: Procedure(s): LAPAROSCOPIC CHOLECYSTECTOMY WITH INTRAOPERATIVE CHOLANGIOGRAM (N/A)  Patient Location: PACU  Anesthesia Type:General  Level of Consciousness: awake, alert , oriented and patient cooperative  Airway & Oxygen Therapy: Patient Spontanous Breathing and Patient connected to nasal cannula oxygen  Post-op Assessment: Report given to PACU RN, Post -op Vital signs reviewed and stable and Patient moving all extremities  Post vital signs: Reviewed and stable  Complications: No apparent anesthesia complications

## 2014-12-16 NOTE — Op Note (Signed)
12/12/2014 - 12/16/2014  11:12 AM  PATIENT:  Kevin Villegas  73 y.o. male  PRE-OPERATIVE DIAGNOSIS:  Biliary pancreatitis  POST-OPERATIVE DIAGNOSIS:  Biliary pancreatitis, choledocholithiasis  PROCEDURE:  Procedure(s): LAPAROSCOPIC CHOLECYSTECTOMY WITH INTRAOPERATIVE CHOLANGIOGRAM  SURGEON:  Georganna Skeans, M.D.  ASSISTANTS: Donnie Mesa, M.D.   ANESTHESIA:   local and general  EBL:  Total I/O In: 1400 [I.V.:1400] Out: -   BLOOD ADMINISTERED:none  DRAINS: none   SPECIMEN:  Excision  DISPOSITION OF SPECIMEN:  PATHOLOGY  COUNTS:  YES  DICTATION: .Dragon Dictation   Findings: Mild gallbladder inflammation, common bile duct stone which was flushed successfully during cholangiogram   Procedure in detail: Patient presents for cholecystectomy. He was identified in the preop holding area. He received intravenous antibiotics. Informed consent was obtained. He was brought to the operating room and general endotracheal anesthesia was administered by the anesthesia staff. Abdomen was prepped and draped in sterile fashion. Time out procedure was performed. Infra-umbilical region was infiltrated with local. Infraumbilical incision was made. Subcutaneous tissues were dissected down revealing the anterior fascia. This was divided sharply along the midline.. No cavity was entered under direct vision carefully. 0 Vicryl pursestring suture was placed on the fascial opening. Hassan trocar was inserted and the abdomen was insufflated with carbon dioxide in standard fashion. Under direct vision, a 5 mm epigastric and 5 mm right lateral ports 2 were placed. Local was used at each port site. Laparoscopic exploration revealed an inflamed gallbladder without significant adhesions. The dome was retracted superior medially and the infundibulum was retracted inferior laterally. Dissection began laterally and progressed medially very easily identifying the cystic duct in the anterior branch of the cystic  artery. Cystic duct was dissected until we had a critical view between the cystic duct, the liver, and the infundibulum of the gallbladder. Anterior branch of the cystic artery was dissected, clipped twice proximally and once distally, and divided. Next, a clip was placed on the infundibular cystic duct junction. Small nick was made in the cystic duct. Cook cholangiogram catheter was inserted. Intraoperative cholangiogram revealed a small common bile duct stone. I was able to flush this through during the cholangiogram obtaining a clear completion cholangiogram no further filling defects and good flow of contrast into the duodenum. Classroom catheter was removed. Cystic duct was clipped 3 times proximally and divided. Gallbladder was taken off the liver bed with cautery. We encountered a posterior branch of the cystic artery which was clipped twice proximally. Gallbladder was placed in an eco sac and removed from the abdomen via the infraumbilical port site. Gallbladder was copiously irrigated. Hemostasis was ensured with cautery. Clips all remain in good position. Liver bed was dry and irrigation fluid returned clear. Ports were removed under direct vision. Pneumoperitoneum was released. Informed local fascia was closed by tying the pursestring. All 4 wounds were copiously irrigated and the skin of each was closed with running 4 Vicryl subcuticular followed by Dermabond. All counts were correct. Patient tolerated procedure well without apparent complication and was taken recovery in stable condition.  PATIENT DISPOSITION:  PACU - hemodynamically stable.   Delay start of Pharmacological VTE agent (>24hrs) due to surgical blood loss or risk of bleeding:  no  Georganna Skeans, MD, MPH, FACS Pager: [phone]  12/29/201511:12 AM

## 2014-12-16 NOTE — Anesthesia Postprocedure Evaluation (Signed)
  Anesthesia Post-op Note  Patient: Kevin Villegas  Procedure(s) Performed: Procedure(s): LAPAROSCOPIC CHOLECYSTECTOMY WITH INTRAOPERATIVE CHOLANGIOGRAM (N/A)  Patient Location: PACU  Anesthesia Type:General  Level of Consciousness: awake and alert   Airway and Oxygen Therapy: Patient Spontanous Breathing and Patient connected to nasal cannula oxygen  Post-op Pain: mild  Post-op Assessment: Post-op Vital signs reviewed and Patient's Cardiovascular Status Stable  Post-op Vital Signs: Reviewed and stable  Last Vitals:  Filed Vitals:   12/16/14 1120  BP: 143/72  Pulse: 62  Temp: 36.8 C  Resp: 10    Complications: No apparent anesthesia complications

## 2014-12-17 ENCOUNTER — Encounter (HOSPITAL_COMMUNITY): Payer: Self-pay | Admitting: General Surgery

## 2014-12-17 DIAGNOSIS — R748 Abnormal levels of other serum enzymes: Secondary | ICD-10-CM

## 2014-12-17 LAB — CBC
HEMATOCRIT: 35.8 % — AB (ref 39.0–52.0)
Hemoglobin: 12.1 g/dL — ABNORMAL LOW (ref 13.0–17.0)
MCH: 31.6 pg (ref 26.0–34.0)
MCHC: 33.8 g/dL (ref 30.0–36.0)
MCV: 93.5 fL (ref 78.0–100.0)
Platelets: 259 10*3/uL (ref 150–400)
RBC: 3.83 MIL/uL — ABNORMAL LOW (ref 4.22–5.81)
RDW: 12.9 % (ref 11.5–15.5)
WBC: 8.9 10*3/uL (ref 4.0–10.5)

## 2014-12-17 LAB — COMPREHENSIVE METABOLIC PANEL
ALBUMIN: 2.6 g/dL — AB (ref 3.5–5.2)
ALK PHOS: 87 U/L (ref 39–117)
ALT: 89 U/L — AB (ref 0–53)
AST: 62 U/L — ABNORMAL HIGH (ref 0–37)
Anion gap: 6 (ref 5–15)
BUN: 8 mg/dL (ref 6–23)
CHLORIDE: 108 meq/L (ref 96–112)
CO2: 26 mmol/L (ref 19–32)
Calcium: 8.8 mg/dL (ref 8.4–10.5)
Creatinine, Ser: 0.98 mg/dL (ref 0.50–1.35)
GFR calc Af Amer: >90 mL/min (ref >90)
GFR calc non Af Amer: 80 mL/min — ABNORMAL LOW (ref >90)
Glucose, Bld: 154 mg/dL — ABNORMAL HIGH (ref 70–99)
POTASSIUM: 4.1 mmol/L (ref 3.5–5.1)
SODIUM: 140 mmol/L (ref 135–145)
Total Bilirubin: 0.5 mg/dL (ref 0.3–1.2)
Total Protein: 5.4 g/dL — ABNORMAL LOW (ref 6.0–8.3)

## 2014-12-17 LAB — GLUCOSE, CAPILLARY: Glucose-Capillary: 151 mg/dL — ABNORMAL HIGH (ref 70–99)

## 2014-12-17 MED ORDER — AMLODIPINE BESYLATE 10 MG PO TABS: 5.0000 mg | ORAL_TABLET | Freq: Every day | ORAL | Status: DC

## 2014-12-17 MED ORDER — OXYCODONE HCL 5 MG PO TABS: 5.0000 mg | ORAL_TABLET | Freq: Four times a day (QID) | ORAL | Status: DC | PRN

## 2014-12-17 NOTE — Progress Notes (Signed)
[facility] Progress Note  1 Day Post-Op  Subjective: Pt feels great, tolerating diet well.  Has been ambulating frequently, minimal pain.  No N/V, urinating well.    Objective: Vital signs in last 24 hours: Temp:  [97.7 F (36.5 C)-98.7 F (37.1 C)] 98.2 F (36.8 C) (12/30 0620) Pulse Rate:  [56-78] 56 (12/30 0620) Resp:  [10-16] 16 (12/30 0620) BP: (123-154)/(57-85) 123/57 mmHg (12/30 0620) SpO2:  [95 %-99 %] 98 % (12/30 0620) Last BM Date: 12/12/14  Intake/Output from previous day: 12/29 0701 - 12/30 0700 In: 3017 [P.O.:480; I.V.:2537] Out: 750 [Urine:750] Intake/Output this shift:    PE: Gen:  Alert, NAD, pleasant Abd: Soft, NT/ND, +BS, no HSM, incisions C/D/I   Lab Results:   Recent Labs  12/16/14 0459 12/17/14 0534  WBC 7.2 8.9  HGB 11.7* 12.1*  HCT 35.4* 35.8*  PLT 209 259   BMET  Recent Labs  12/16/14 0459 12/17/14 0534  NA 138 140  K 3.7 4.1  CL 107 108  CO2 23 26  GLUCOSE 125* 154*  BUN 6 8  CREATININE 0.94 0.98  CALCIUM 8.3* 8.8   PT/INR No results for input(s): LABPROT, INR in the last 72 hours. CMP     Component Value Date/Time   NA 140 12/17/2014 0534   K 4.1 12/17/2014 0534   CL 108 12/17/2014 0534   CO2 26 12/17/2014 0534   GLUCOSE 154* 12/17/2014 0534   BUN 8 12/17/2014 0534   CREATININE 0.98 12/17/2014 0534   CALCIUM 8.8 12/17/2014 0534   PROT 5.4* 12/17/2014 0534   ALBUMIN 2.6* 12/17/2014 0534   AST 62* 12/17/2014 0534   ALT 89* 12/17/2014 0534   ALKPHOS 87 12/17/2014 0534   BILITOT 0.5 12/17/2014 0534   GFRNONAA 80* 12/17/2014 0534   GFRAA >90 12/17/2014 0534   Lipase     Component Value Date/Time   LIPASE 172* 12/15/2014 0858       Studies/Results: Dg Cholangiogram Operative  12/16/2014   CLINICAL DATA:  Gallstones.  EXAM: INTRAOPERATIVE CHOLANGIOGRAM  FLUOROSCOPY TIME:  10 seconds  COMPARISON:  CT abdomen pelvis- 12/12/2014  FINDINGS: Intraoperative angiographic images of the right upper  abdominal quadrant during laparoscopic cholecystectomy are provided for review.  Surgical clips overlie the expected location of the gallbladder fossa.  Contrast injection demonstrates selective cannulation of the central aspect of the cystic duct.  There is passage of contrast through the central aspect of the cystic duct with filling of a non dilated common bile duct. There is passage of contrast though the CBD and into the descending portion of the duodenum.  There is minimal reflux of injected contrast into the common hepatic duct and central aspect of the non dilated intrahepatic biliary system.  There is a minimal amounts of debris within the CBD as well as the proper hepatic duct. Additionally, there is a persistent nonocclusive filling defect within the mid aspect of the CBD which may represent a nonocclusive gallstone. A potential air bubble is seen within the central aspect of the right intrahepatic biliary system.  There is very minimal apparent opacification of the central most aspect of the pancreatic duct.  IMPRESSION: 1. Suspected debris / sludge within the common bile and proper hepatic ducts. Additionally, there is a potential nonocclusive stone within the mid aspect of the CBD. Correlation with the operative report is recommended. Further evaluation could be performed with MRCP and/or ERCP as clinically indicated. 2. Minimal apparent opacification of the central most aspect of the  pancreatic duct.   Electronically Signed   By: Sandi Mariscal M.D.   On: 12/16/2014 11:18   US Abdomen Limited Ruq  12/15/2014   CLINICAL DATA:  Evaluate for common bile duct stone or biliary ductal enlargement. Pancreatitis.  EXAM: US ABDOMEN LIMITED - RIGHT UPPER QUADRANT  COMPARISON:  CT 12/12/2014  FINDINGS: Gallbladder:  The gallbladder lumen is full of echogenic shadowing gallstones. No gallbladder wall thickening. No pericholecystic fluid. Negative sonographic Murphy sign.  Common bile duct:  Diameter: 4 mm   Liver:  Multiple cysts are demonstrated within the liver, the largest of which measures 2 cm within the right hepatic lobe.  IMPRESSION: Cholelithiasis without sonographic evidence for acute cholecystitis.   Electronically Signed   By: Lovey Newcomer M.D.   On: 12/15/2014 20:57    Anti-infectives: Anti-infectives    None       Assessment/Plan Gallstone pancreatitis - POD #1 s/p Lap chole with IOC Choledocholithiasis - successful removal of stone intra-operatively  Plan: 1. IVF, pain control, antiemetics, no more antibiotics needed 2.  Ambulate and IS 3.  SCD's and heparin 4.  No need for GI since we were able to flush the CBD stone intra-operatively and Labs are improving 5.  Hopefully home today if tolerating diet, ambulating well, and pain well controlled 6.  He changed his mind about traveling to [place] in the next 6 weeks, so I have arranged for follow up on 01/06/14 at 3:45pm    LOS: 5 days    DORT, Yuritza Paulhus 12/17/2014, 8:00 AM Pager: [phone]

## 2014-12-17 NOTE — Progress Notes (Signed)
Kevin Villegas to be D/C'd Home per MD order.  Discussed with the patient and all questions fully answered.  VSS.Surgical sites clean,dry intact with no sign of infection. IV catheter discontinued intact. Site without signs and symptoms of complications. Dressing and pressure applied.  An After Visit Summary was printed and given to the patient. Patient received prescriptions.  D/c education completed with patient/family including follow up instructions, medication list, d/c activities limitations if indicated, with other d/c instructions as indicated by MD - patient able to verbalize understanding, all questions fully answered.   Patient instructed to return to ED, call 911, or call MD for any changes in condition.   Patient escorted via [place], and D/C home via private auto.  [name] 12/17/2014 11:47 AM

## 2014-12-17 NOTE — Discharge Instructions (Signed)

## 2014-12-17 NOTE — Discharge Summary (Signed)
PATIENT DETAILS Name: Kevin Villegas Age: 73 y.o. Sex: male Date of Birth: [birth year] MRN: [id number]. Admitting Physician: Velvet Bathe, MD PCP:No primary care provider on file.  Admit Date: 12/12/2014 Discharge date: 12/17/2014  Recommendations for Outpatient Follow-up:  Reassess need for antihypertensive medications on follow-up   PRIMARY DISCHARGE DIAGNOSIS:  Active Problems:   Acute pancreatitis   Hypertension      PAST MEDICAL HISTORY: Past Medical History  Diagnosis Date  . Medical history non-contributory     DISCHARGE MEDICATIONS: Current Discharge Medication List    START taking these medications   Details  amLODipine (NORVASC) 10 MG tablet Take 0.5 tablets (5 mg total) by mouth daily. Qty: 30 tablet, Refills: 0    oxyCODONE (OXY IR/ROXICODONE) 5 MG immediate release tablet Take 1 tablet (5 mg total) by mouth every 6 (six) hours as needed (pain). Qty: 30 tablet, Refills: 0        ALLERGIES:  No Known Allergies  BRIEF HPI:  See H&P, Labs, Consult and Test reports for all details in brief, patient is a 73 year old male with no significant past medical history was admitted for severe abdominal discomfort. Further evaluation revealed acute pancreatitis Kevin Villegas patient was admitted for further evaluation and treatment  CONSULTATIONS:   general surgery  PERTINENT RADIOLOGIC STUDIES: Dg Cholangiogram Operative  12/16/2014   CLINICAL DATA:  Gallstones.  EXAM: INTRAOPERATIVE CHOLANGIOGRAM  FLUOROSCOPY TIME:  10 seconds  COMPARISON:  CT abdomen pelvis- 12/12/2014  FINDINGS: Intraoperative angiographic images of the right upper abdominal quadrant during laparoscopic cholecystectomy are provided for review.  Surgical clips overlie the expected location of the gallbladder fossa.  Contrast injection demonstrates selective cannulation of the central aspect of the cystic duct.  There is passage of contrast through the central aspect of the cystic duct with filling of a  non dilated common bile duct. There is passage of contrast though the CBD and into the descending portion of the duodenum.  There is minimal reflux of injected contrast into the common hepatic duct and central aspect of the non dilated intrahepatic biliary system.  There is a minimal amounts of debris within the CBD as well as the proper hepatic duct. Additionally, there is a persistent nonocclusive filling defect within the mid aspect of the CBD which may represent a nonocclusive gallstone. A potential air bubble is seen within the central aspect of the right intrahepatic biliary system.  There is very minimal apparent opacification of the central most aspect of the pancreatic duct.  IMPRESSION: 1. Suspected debris / sludge within the common bile and proper hepatic ducts. Additionally, there is a potential nonocclusive stone within the mid aspect of the CBD. Correlation with the operative report is recommended. Further evaluation could be performed with MRCP and/or ERCP as clinically indicated. 2. Minimal apparent opacification of the central most aspect of the pancreatic duct.   Electronically Signed   By: Sandi Mariscal M.D.   On: 12/16/2014 11:18   Ct Abdomen Pelvis W Contrast  12/12/2014   CLINICAL DATA:  Abdominal pain.  Initial encounter.  EXAM: CT ABDOMEN AND PELVIS WITH CONTRAST  TECHNIQUE: Multidetector CT imaging of the abdomen and pelvis was performed using the standard protocol following bolus administration of intravenous contrast.  CONTRAST:  1100mL OMNIPAQUE IOHEXOL 300 MG/ML  SOLN  COMPARISON:  None.  FINDINGS: Stranding throughout the parenchyma of the pancreas compatible with acute pancreatitis. No localized fluid collection about the pancreas. There is stranding about the retroperitoneal fat. No evidence of pancreatic hemorrhage or  necrosis.  Several gallstones are noted  Several hypodensities throughout the liver are likely small cysts.  Adrenal glands and spleen are within normal limits.  Bilateral benign-appearing renal cysts with cortical atrophy. Some hypodensities in the kidneys are too small to characterize  Moderate aortic valvular calcification.  Bladder is distended.  Unremarkable prostate  Extensive sigmoid diverticulosis. Moderate diverticulosis throughout the remainder of the colon.  Free fluid in the right lower quadrant, lesser sac, and pelvis likely related to pancreatitis.  Advanced degenerative disc disease in the lumbar spine.  IMPRESSION: Findings compatible with acute pancreatitis. No evidence of necrosis or hemorrhage. No acute pancreatic fluid collection.  Cholelithiasis.   Electronically Signed   By: Maryclare Bean M.D.   On: 12/12/2014 15:54   US Abdomen Limited Ruq  12/15/2014   CLINICAL DATA:  Evaluate for common bile duct stone or biliary ductal enlargement. Pancreatitis.  EXAM: US ABDOMEN LIMITED - RIGHT UPPER QUADRANT  COMPARISON:  CT 12/12/2014  FINDINGS: Gallbladder:  The gallbladder lumen is full of echogenic shadowing gallstones. No gallbladder wall thickening. No pericholecystic fluid. Negative sonographic Murphy sign.  Common bile duct:  Diameter: 4 mm  Liver:  Multiple cysts are demonstrated within the liver, the largest of which measures 2 cm within the right hepatic lobe.  IMPRESSION: Cholelithiasis without sonographic evidence for acute cholecystitis.   Electronically Signed   By: Lovey Newcomer M.D.   On: 12/15/2014 20:57     PERTINENT LAB RESULTS: CBC:  Recent Labs  12/16/14 0459 12/17/14 0534  WBC 7.2 8.9  HGB 11.7* 12.1*  HCT 35.4* 35.8*  PLT 209 259   CMET CMP     Component Value Date/Time   NA 140 12/17/2014 0534   K 4.1 12/17/2014 0534   CL 108 12/17/2014 0534   CO2 26 12/17/2014 0534   GLUCOSE 154* 12/17/2014 0534   BUN 8 12/17/2014 0534   CREATININE 0.98 12/17/2014 0534   CALCIUM 8.8 12/17/2014 0534   PROT 5.4* 12/17/2014 0534   ALBUMIN 2.6* 12/17/2014 0534   AST 62* 12/17/2014 0534   ALT 89* 12/17/2014 0534   ALKPHOS 87  12/17/2014 0534   BILITOT 0.5 12/17/2014 0534   GFRNONAA 80* 12/17/2014 0534   GFRAA >90 12/17/2014 0534    GFR Estimated Creatinine Clearance: 72.6 mL/min (by C-G formula based on Cr of 0.98).  Recent Labs  12/15/14 0858  LIPASE 172*   No results for input(s): CKTOTAL, CKMB, CKMBINDEX, TROPONINI in the last 72 hours. Invalid input(s): POCBNP No results for input(s): DDIMER in the last 72 hours. No results for input(s): HGBA1C in the last 72 hours. No results for input(s): CHOL, HDL, LDLCALC, TRIG, CHOLHDL, LDLDIRECT in the last 72 hours. No results for input(s): TSH, T4TOTAL, T3FREE, THYROIDAB in the last 72 hours.  Invalid input(s): FREET3 No results for input(s): VITAMINB12, FOLATE, FERRITIN, TIBC, IRON, RETICCTPCT in the last 72 hours. Coags: No results for input(s): INR in the last 72 hours.  Invalid input(s): PT Microbiology: Recent Results (from the past 240 hour(s))  Surgical pcr screen     Status: Abnormal   Collection Time: 12/15/14 11:26 AM  Result Value Ref Range Status   MRSA, PCR NEGATIVE NEGATIVE Final   Staphylococcus aureus POSITIVE (A) NEGATIVE Final    Comment:        The Xpert SA Assay (FDA approved for NASAL specimens in patients over 62 years of age), is one component of a comprehensive surveillance program.  Test performance has been validated by [facility] for patients greater than or equal to 79 year old. It is not intended to diagnose infection nor to guide or monitor treatment.      BRIEF HOSPITAL COURSE:   Active Problems:   Acute gallstone pancreatitis: Patient was admitted, kept nothing by mouth and provided with supportive care. General surgery was consulted, subsequently patient underwent upper scope cholecystectomy on 12/16/14 he did during this procedure, patient was found to have a CBD stone that was successfully flushed out during cholangiogram. Postoperatively, patient doing very well ambulating independently and already  tolerating regular diet. Passing flatus. Seen by general surgery on 4/30 and recommendations are for discharge. Follow-up appointment with Gen. surgery scheduled for 01/06/15.       Hypokalemia: Resolved with supplementation. Likely secondary to vomiting    Elevated liver enzymes: Likely secondary to gallstone pancreatitis, liver enzymes continue to decrease. Suspect they will normalize over the next few weeks.    Hypertension: Noted to have high blood pressure when acutely ill and started on amlodipine with good control of the pressure. Currently on 10 mg of amlodipine, patient prior to this hospitalization did not have a diagnosis of hypertension. I will reduce amlodipine to 5 mg, I have instructed him to follow-up with his primary care practitioner within a month to see if he still needs to continue this long-term.  TODAY-DAY OF DISCHARGE:  Subjective:   Kevin Villegas today has no headache,no chest abdominal pain,no new weakness tingling or numbness, feels much better wants to go home today.   Objective:   Blood pressure 123/57, pulse 56, temperature 98.2 F (36.8 C), temperature source Oral, resp. rate 16, height 5\' 11"  (1.803 m), weight 86.183 kg (190 lb), SpO2 98 %.  Intake/Output Summary (Last 24 hours) at 12/17/14 1112 Last data filed at 12/17/14 0521  Gross per 24 hour  Intake   1617 ml  Output    750 ml  Net    867 ml   Filed Weights   12/12/14 1736  Weight: 86.183 kg (190 lb)    Exam Awake Alert, Oriented *3, No new F.N deficits, Normal affect [place].AT,PERRAL Supple Neck,No JVD, No cervical lymphadenopathy appriciated.  Symmetrical Chest wall movement, Good air movement bilaterally, CTAB RRR,No Gallops,Rubs or new Murmurs, No Parasternal Heave +ve B.Sounds, Abd Soft, Non tender, No organomegaly appriciated, No rebound -guarding or rigidity. No Cyanosis, Clubbing or edema, No new Rash or bruise  DISCHARGE CONDITION: Stable  DISPOSITION: Home  DISCHARGE  INSTRUCTIONS:    Activity:  As tolerated  Diet recommendation: Heart Healthy diet  Discharge Instructions    Call MD for:  redness, tenderness, or signs of infection (pain, swelling, redness, odor or green/yellow discharge around incision site)    Complete by:  As directed      Diet - low sodium heart healthy    Complete by:  As directed      Increase activity slowly    Complete by:  As directed            Follow-up Information    Follow up with [facility] On 01/06/2015.   Why:  For post-operation check at 3:45pm, please arrive 30 minutes early to check in and fill out paperwork   Contact information:   [place] [address] [phone]       Schedule an appointment as soon as possible for a visit in 1 month to follow up.   Contact information:   Primary  Care MD in [place]      Total Time spent on discharge equals 45 minutes.  SignedOren Binet 12/17/2014 11:12 AM

## 2015-08-23 ENCOUNTER — Encounter (HOSPITAL_COMMUNITY): Payer: Self-pay | Admitting: Nurse Practitioner

## 2015-08-23 ENCOUNTER — Emergency Department (HOSPITAL_COMMUNITY)
Admission: EM | Admit: 2015-08-23 | Discharge: 2015-08-23 | Disposition: A | Discharge status: Home / Self Care | Payer: Medicare Other | Attending: Emergency Medicine | Admitting: Emergency Medicine

## 2015-08-23 ENCOUNTER — Emergency Department (HOSPITAL_COMMUNITY): Payer: Medicare Other

## 2015-08-23 DIAGNOSIS — K859 Acute pancreatitis, unspecified: Secondary | ICD-10-CM | POA: Diagnosis not present

## 2015-08-23 DIAGNOSIS — Z79899 Other long term (current) drug therapy: Secondary | ICD-10-CM | POA: Insufficient documentation

## 2015-08-23 DIAGNOSIS — K5900 Constipation, unspecified: Secondary | ICD-10-CM | POA: Diagnosis present

## 2015-08-23 LAB — CBC WITH DIFFERENTIAL/PLATELET
BASOS PCT: 0 % (ref 0–1)
Basophils Absolute: 0 10*3/uL (ref 0.0–0.1)
EOS ABS: 0.1 10*3/uL (ref 0.0–0.7)
Eosinophils Relative: 2 % (ref 0–5)
HCT: 46 % (ref 39.0–52.0)
Hemoglobin: 15.8 g/dL (ref 13.0–17.0)
Lymphocytes Relative: 21 % (ref 12–46)
Lymphs Abs: 1.6 10*3/uL (ref 0.7–4.0)
MCH: 32.2 pg (ref 26.0–34.0)
MCHC: 34.3 g/dL (ref 30.0–36.0)
MCV: 93.9 fL (ref 78.0–100.0)
MONO ABS: 0.9 10*3/uL (ref 0.1–1.0)
Monocytes Relative: 12 % (ref 3–12)
Neutro Abs: 4.9 10*3/uL (ref 1.7–7.7)
Neutrophils Relative %: 65 % (ref 43–77)
PLATELETS: 198 10*3/uL (ref 150–400)
RBC: 4.9 MIL/uL (ref 4.22–5.81)
RDW: 13.1 % (ref 11.5–15.5)
WBC: 7.6 10*3/uL (ref 4.0–10.5)

## 2015-08-23 LAB — COMPREHENSIVE METABOLIC PANEL
ALBUMIN: 4.3 g/dL (ref 3.5–5.0)
ALT: 38 U/L (ref 17–63)
ANION GAP: 10 (ref 5–15)
AST: 40 U/L (ref 15–41)
Alkaline Phosphatase: 55 U/L (ref 38–126)
BUN: 15 mg/dL (ref 6–20)
CHLORIDE: 102 mmol/L (ref 101–111)
CO2: 26 mmol/L (ref 22–32)
Calcium: 9.6 mg/dL (ref 8.9–10.3)
Creatinine, Ser: 1.11 mg/dL (ref 0.61–1.24)
GFR calc Af Amer: >60 mL/min (ref >60)
GFR calc non Af Amer: >60 mL/min (ref >60)
GLUCOSE: 101 mg/dL — AB (ref 65–99)
POTASSIUM: 4.7 mmol/L (ref 3.5–5.1)
SODIUM: 138 mmol/L (ref 135–145)
Total Bilirubin: 0.7 mg/dL (ref 0.3–1.2)
Total Protein: 7.5 g/dL (ref 6.5–8.1)

## 2015-08-23 LAB — URINE MICROSCOPIC-ADD ON

## 2015-08-23 LAB — URINALYSIS, ROUTINE W REFLEX MICROSCOPIC
BILIRUBIN URINE: NEGATIVE
GLUCOSE, UA: NEGATIVE mg/dL
KETONES UR: 15 mg/dL — AB
Leukocytes, UA: NEGATIVE
Nitrite: NEGATIVE
Protein, ur: NEGATIVE mg/dL
Specific Gravity, Urine: 1.009 (ref 1.005–1.030)
Urobilinogen, UA: 0.2 mg/dL (ref 0.0–1.0)
pH: 6 (ref 5.0–8.0)

## 2015-08-23 LAB — LIPASE, BLOOD: Lipase: 185 U/L — ABNORMAL HIGH (ref 22–51)

## 2015-08-23 LAB — I-STAT CG4 LACTIC ACID, ED: Lactic Acid, Venous: 0.85 mmol/L (ref 0.5–2.0)

## 2015-08-23 LAB — CK: CK TOTAL: 155 U/L (ref 49–397)

## 2015-08-23 MED ORDER — IOHEXOL 300 MG/ML  SOLN
100.0000 mL | Freq: Once | INTRAMUSCULAR | Status: AC | PRN
  Administered 2015-08-23: 100 mL via INTRAVENOUS

## 2015-08-23 MED ORDER — SODIUM CHLORIDE 0.9 % IV BOLUS (SEPSIS)
1000.0000 mL | Freq: Once | INTRAVENOUS | Status: AC
  Administered 2015-08-23: 1000 mL via INTRAVENOUS

## 2015-08-23 MED ORDER — OXYCODONE HCL 5 MG PO TABS: 5.0000 mg | ORAL_TABLET | Freq: Four times a day (QID) | ORAL | Status: DC | PRN

## 2015-08-23 MED ORDER — POLYETHYLENE GLYCOL 3350 17 G PO PACK: 17.0000 g | PACK | Freq: Every day | ORAL | Status: DC

## 2015-08-23 MED ORDER — ONDANSETRON HCL 4 MG PO TABS: 4.0000 mg | ORAL_TABLET | Freq: Four times a day (QID) | ORAL | Status: DC

## 2015-08-23 NOTE — ED Notes (Signed)
He reports working outside and getting "overheated" on Thursday. He has been unable to have a bowel movement since. He normally has a bowel movement every day. He states his "intestines feel sore" now. He tried stool softener and increasing fluids with no relief. He denies n/v/urinary changes.

## 2015-08-23 NOTE — Discharge Instructions (Signed)
Acute Pancreatitis Drink only clear liquids for the next 2-3 days. Take pain medication as prescribed. Follow-up with gastroenterology next week. Return to the ED if you develop worsening pain, fever, vomiting or any other concerns. Acute pancreatitis is a disease in which the pancreas becomes suddenly inflamed. The pancreas is a large gland located behind your stomach. The pancreas produces enzymes that help digest food. The pancreas also releases the hormones glucagon and insulin that help regulate blood sugar. Damage to the pancreas occurs when the digestive enzymes from the pancreas are activated and begin attacking the pancreas before being released into the intestine. Most acute attacks last a couple of days and can cause serious complications. Some people become dehydrated and develop low blood pressure. In severe cases, bleeding into the pancreas can lead to shock and can be life-threatening. The lungs, heart, and kidneys may fail. CAUSES  Pancreatitis can happen to anyone. In some cases, the cause is unknown. Most cases are caused by:  Alcohol abuse.  Gallstones. Other less common causes are:  Certain medicines.  Exposure to certain chemicals.  Infection.  Damage caused by an accident (trauma).  Abdominal surgery. SYMPTOMS   Pain in the upper abdomen that may radiate to the back.  Tenderness and swelling of the abdomen.  Nausea and vomiting. DIAGNOSIS  Your caregiver will perform a physical exam. Blood and stool tests may be done to confirm the diagnosis. Imaging tests may also be done, such as X-rays, CT scans, or an ultrasound of the abdomen. TREATMENT  Treatment usually requires a stay in the hospital. Treatment may include:  Pain medicine.  Fluid replacement through an intravenous line (IV).  Placing a tube in the stomach to remove stomach contents and control vomiting.  Not eating for 3 or 4 days. This gives your pancreas a rest, because enzymes are not being  produced that can cause further damage.  Antibiotic medicines if your condition is caused by an infection.  Surgery of the pancreas or gallbladder. HOME CARE INSTRUCTIONS   Follow the diet advised by your caregiver. This may involve avoiding alcohol and decreasing the amount of fat in your diet.  Eat smaller, more frequent meals. This reduces the amount of digestive juices the pancreas produces.  Drink enough fluids to keep your urine clear or pale yellow.  Only take over-the-counter or prescription medicines as directed by your caregiver.  Avoid drinking alcohol if it caused your condition.  Do not smoke.  Get plenty of rest.  Check your blood sugar at home as directed by your caregiver.  Keep all follow-up appointments as directed by your caregiver. SEEK MEDICAL CARE IF:   You do not recover as quickly as expected.  You develop new or worsening symptoms.  You have persistent pain, weakness, or nausea.  You recover and then have another episode of pain. SEEK IMMEDIATE MEDICAL CARE IF:   You are unable to eat or keep fluids down.  Your pain becomes severe.  You have a fever or persistent symptoms for more than 2 to 3 days.  You have a fever and your symptoms suddenly get worse.  Your skin or the white part of your eyes turn yellow (jaundice).  You develop vomiting.  You feel dizzy, or you faint.  Your blood sugar is high (over 300 mg/dL). MAKE SURE YOU:   Understand these instructions.  Will watch your condition.  Will get help right away if you are not doing well or get worse. Document Released: 12/05/2005 Document Revised:  06/05/2012 Document Reviewed: 03/15/2012 ExitCare Patient Information 2015 [place], [place]. This information is not intended to replace advice given to you by your health care provider. Make sure you discuss any questions you have with your health care provider.

## 2015-08-23 NOTE — ED Notes (Signed)
Pt A&Ox4, ambulatory at d/c with steady gait, NAD 

## 2015-08-23 NOTE — ED Provider Notes (Signed)
CSN: [id number]     Arrival date & time 08/23/15  1210 History   First MD Initiated Contact with Patient 08/23/15 1500     Chief Complaint  Patient presents with  . Constipation     (Consider location/radiation/quality/duration/timing/severity/associated sxs/prior Treatment) HPI Comments: Patient presents with feeling constipated for the past 3 days. States he was working outside building a fence 3 days ago and believes he got overheated. Has not had a bowel movement since Thursday. He normally goes every day. He's been taking apple juice and stool softeners without relief. He denies any blood in the stool denies any straining. Denies any vomiting. No fever. No urinary changes. Denies any dizziness or lightheadedness. No chest pain or shortness of breath. Endorses abdominal discomfort but denies pain. States he's been eating well and drinking well and has a good appetite. Only medical problem is previous cholecystectomy December 2015. Denies any regular medications. Says he is passing flatus still.  The history is provided by the patient and the spouse.    Past Medical History  Diagnosis Date  . Medical history non-contributory    Past Surgical History  Procedure Laterality Date  . Cholecystectomy N/A 12/16/2014    Procedure: LAPAROSCOPIC CHOLECYSTECTOMY WITH INTRAOPERATIVE CHOLANGIOGRAM;  Surgeon: Georganna Skeans, MD;  Location: [facility];  Service: General;  Laterality: N/A;   History reviewed. No pertinent family history. Social History  Substance Use Topics  . Smoking status: Never Smoker   . Smokeless tobacco: None  . Alcohol Use: None    Review of Systems  Constitutional: Negative for fever, activity change and appetite change.  HENT: Negative for congestion and rhinorrhea.   Respiratory: Negative for cough, chest tightness and shortness of breath.   Cardiovascular: Negative for chest pain.  Gastrointestinal: Positive for abdominal pain and constipation. Negative for nausea and  vomiting.  Genitourinary: Negative for dysuria, hematuria and testicular pain.  Musculoskeletal: Negative for myalgias, joint swelling and arthralgias.  Skin: Negative for rash.  Neurological: Negative for dizziness, weakness and numbness.  A complete 10 system review of systems was obtained and all systems are negative except as noted in the HPI and PMH.      Allergies  Review of patient's allergies indicates no known allergies.  Home Medications   Prior to Admission medications   Medication Sig Start Date End Date Taking? Authorizing Provider  docusate (COLACE) 60 MG/15ML syrup Take 60 mg by mouth 2 (two) times daily as needed (fotr constipation).   Yes Historical Provider, MD  amLODipine (NORVASC) 10 MG tablet Take 0.5 tablets (5 mg total) by mouth daily. 12/17/14   Shanker Kristeen Mans, MD  ondansetron (ZOFRAN) 4 MG tablet Take 1 tablet (4 mg total) by mouth every 6 (six) hours. 08/23/15   Ezequiel Essex, MD  oxyCODONE (OXY IR/ROXICODONE) 5 MG immediate release tablet Take 1 tablet (5 mg total) by mouth every 6 (six) hours as needed (pain). 08/23/15   Ezequiel Essex, MD  polyethylene glycol [facility]) packet Take 17 g by mouth daily. 08/23/15   Ezequiel Essex, MD   BP 143/78 mmHg  Pulse 68  Temp(Src) 98.3 F (36.8 C) (Oral)  Resp 18  Ht 5\' 11"  (1.803 m)  Wt 179 lb 12.8 oz (81.557 kg)  BMI 25.09 kg/m2  SpO2 98% Physical Exam  Constitutional: He is oriented to person, place, and time. He appears well-developed and well-nourished. No distress.  HENT:  Head: Normocephalic and atraumatic.  Mouth/Throat: Oropharynx is clear and moist. No oropharyngeal exudate.  Eyes: Conjunctivae and  EOM are normal. Pupils are equal, round, and reactive to light.  Neck: Normal range of motion. Neck supple.  No meningismus.  Cardiovascular: Normal rate, regular rhythm, normal heart sounds and intact distal pulses.   No murmur heard. Pulmonary/Chest: Effort normal and breath sounds normal. No  respiratory distress.  Abdominal: Soft. There is no tenderness. There is no rebound and no guarding.  Soft, no guarding or rebound. Equal femoral pulses. No appreciable hernias  Genitourinary:  No fecal impaction  Musculoskeletal: Normal range of motion. He exhibits no edema or tenderness.  Neurological: He is alert and oriented to person, place, and time. No cranial nerve deficit. He exhibits normal muscle tone. Coordination normal.  No ataxia on finger to nose bilaterally. No pronator drift. 5/5 strength throughout. CN 2-12 intact. Negative Romberg. Equal grip strength. Sensation intact. Gait is normal.   Skin: Skin is warm.  Psychiatric: He has a normal mood and affect. His behavior is normal.  Nursing note and vitals reviewed.   ED Course  Procedures (including critical care time) Labs Review Labs Reviewed  URINALYSIS, ROUTINE W REFLEX MICROSCOPIC (NOT AT [facility]) - Abnormal; Notable for the following:    Hgb urine dipstick TRACE (*)    Ketones, ur 15 (*)    All other components within normal limits  COMPREHENSIVE METABOLIC PANEL - Abnormal; Notable for the following:    Glucose, Bld 101 (*)    All other components within normal limits  LIPASE, BLOOD - Abnormal; Notable for the following:    Lipase 185 (*)    All other components within normal limits  CBC WITH DIFFERENTIAL/PLATELET  CK  URINE MICROSCOPIC-ADD ON  I-STAT CG4 LACTIC ACID, ED    Imaging Review Ct Abdomen Pelvis W Contrast  08/23/2015   CLINICAL DATA:  Diffuse abdominal pain constipation for the past 3 days. Elevated lipase. Previous cholecystectomy.  EXAM: CT ABDOMEN AND PELVIS WITH CONTRAST  TECHNIQUE: Multidetector CT imaging of the abdomen and pelvis was performed using the standard protocol following bolus administration of intravenous contrast.  CONTRAST:  173mL OMNIPAQUE IOHEXOL 300 MG/ML  SOLN  COMPARISON:  Radiographs obtained earlier today. Abdomen and pelvis CT dated 12/12/2014.  FINDINGS: Multiple small  liver cysts are again demonstrated. Also noted are bilateral renal cysts, including bilateral parapelvic cysts, greater on the left compared to the right. Cholecystectomy clips. The distal pancreatic tail has ill-defined margins with peripancreatic soft tissue stranding. The pancreatic duct in that portion of the tail is also dilated, measuring 4.9 mm in diameter. The remainder the pancreas has a normal appearance. No fluid collections are seen.  Unremarkable spleen, accessory splenule, adrenal glands, ureters and urinary bladder. Mildly enlarged prostate gland. Large number of colonic diverticula without evidence of diverticulitis. No evidence of appendicitis. No enlarged lymph nodes. Small sliding hiatal hernia with mild diffuse wall thickening without significant change.  Clear lung bases. Lumbar and lower thoracic spine degenerative changes. Mild bilateral hip degenerative changes.  IMPRESSION: 1. Acute pancreatitis involving the distal tail of the pancreas. 2. Multiple liver and bilateral renal cysts. 3. Extensive colonic diverticulosis. 4. Small sliding hiatal hernia with mild diffuse chronic wall thickening.   Electronically Signed   By: Claudie Revering M.D.   On: 08/23/2015 17:57   Dg Abd Acute W/chest  08/23/2015   CLINICAL DATA:  Constipation after hitting over heated 3 days ago.  EXAM: DG ABDOMEN ACUTE W/ 1V CHEST  COMPARISON:  Abdomen and pelvis CT dated 12/12/2014.  FINDINGS: Normal sized heart. Clear lungs. Normal  bowel gas pattern without free peritoneal air. Cholecystectomy clips. Lumbar and thoracic spine degenerative changes. Normal amount of stool.  IMPRESSION: No acute abnormality.   Electronically Signed   By: Claudie Revering M.D.   On: 08/23/2015 15:39   I have personally reviewed and evaluated these images and lab results as part of my medical decision-making.   EKG Interpretation None      MDM   Final diagnoses:  Acute pancreatitis, unspecified pancreatitis type   Constipation with  abdominal discomfort. No vomiting. Patient appears well, vitals are stable, abdomen is benign.  Abdominal series is negative. Labs are reassuring other than mildly elevated lipase. Patient did have CBD stone during his gallbladder surgery in December that per records was flushed during surgery. Lipase was 172 and hospital discharge in December. Now it is 185.  CT shows mild pancreatitis. Multiple liver renal cysts. The pancreatic duct in the tail is 4.9 mm. No fluid collection seen.  Case discussed with Dr. Hilarie Fredrickson of gastroenterology. Discussed patient's presentation December as well. Patient feels well has had no vomiting wishes to go home. Will treat with clear liquid diet, antiemetics and pain medication. He will follow-up with gastroenterology next week for MRCP. Return to the ED with worsening pain, vomiting, any other concerns.  Ezequiel Essex, MD 08/23/15 [phone]

## 2015-08-25 ENCOUNTER — Telehealth: Payer: Self-pay

## 2015-08-25 NOTE — Telephone Encounter (Signed)
Pt needs APP visit this week if possible, if not then the very next week     Recent pancreatitis    Seen in the ED    JMP

## 2015-08-25 NOTE — Telephone Encounter (Signed)
Pt scheduled to see Lori Hvozdovic, PA-C 08/27/15@2 :15pm. Pt aware of appt.

## 2015-08-27 ENCOUNTER — Encounter: Payer: Self-pay | Admitting: Physician Assistant

## 2015-08-27 ENCOUNTER — Other Ambulatory Visit (INDEPENDENT_AMBULATORY_CARE_PROVIDER_SITE_OTHER): Payer: Medicare Other

## 2015-08-27 ENCOUNTER — Ambulatory Visit (INDEPENDENT_AMBULATORY_CARE_PROVIDER_SITE_OTHER): Payer: Medicare Other | Admitting: Physician Assistant

## 2015-08-27 VITALS — BP 120/70 | HR 76 | Ht 71.0 in | Wt 170.1 lb

## 2015-08-27 DIAGNOSIS — K868 Other specified diseases of pancreas: Secondary | ICD-10-CM | POA: Diagnosis not present

## 2015-08-27 DIAGNOSIS — K8689 Other specified diseases of pancreas: Secondary | ICD-10-CM

## 2015-08-27 DIAGNOSIS — K5909 Other constipation: Secondary | ICD-10-CM

## 2015-08-27 DIAGNOSIS — K859 Acute pancreatitis, unspecified: Secondary | ICD-10-CM

## 2015-08-27 LAB — CBC WITH DIFFERENTIAL/PLATELET
BASOS PCT: 0.3 % (ref 0.0–3.0)
Basophils Absolute: 0 10*3/uL (ref 0.0–0.1)
EOS PCT: 2.9 % (ref 0.0–5.0)
Eosinophils Absolute: 0.3 10*3/uL (ref 0.0–0.7)
HCT: 45 % (ref 39.0–52.0)
Hemoglobin: 15.2 g/dL (ref 13.0–17.0)
LYMPHS ABS: 1.4 10*3/uL (ref 0.7–4.0)
Lymphocytes Relative: 16 % (ref 12.0–46.0)
MCHC: 33.8 g/dL (ref 30.0–36.0)
MCV: 93.6 fl (ref 78.0–100.0)
MONO ABS: 1.1 10*3/uL — AB (ref 0.1–1.0)
Monocytes Relative: 11.9 % (ref 3.0–12.0)
NEUTROS PCT: 68.9 % (ref 43.0–77.0)
Neutro Abs: 6.1 10*3/uL (ref 1.4–7.7)
Platelets: 235 10*3/uL (ref 150.0–400.0)
RBC: 4.81 Mil/uL (ref 4.22–5.81)
RDW: 13.1 % (ref 11.5–15.5)
WBC: 8.9 10*3/uL (ref 4.0–10.5)

## 2015-08-27 LAB — HEPATIC FUNCTION PANEL
ALBUMIN: 4.3 g/dL (ref 3.5–5.2)
ALT: 62 U/L — ABNORMAL HIGH (ref 0–53)
AST: 41 U/L — ABNORMAL HIGH (ref 0–37)
Alkaline Phosphatase: 61 U/L (ref 39–117)
Bilirubin, Direct: 0.3 mg/dL (ref 0.0–0.3)
TOTAL PROTEIN: 7.6 g/dL (ref 6.0–8.3)
Total Bilirubin: 0.9 mg/dL (ref 0.2–1.2)

## 2015-08-27 LAB — AMYLASE: AMYLASE: 40 U/L (ref 27–131)

## 2015-08-27 LAB — CREATININE, SERUM: CREATININE: 1.09 mg/dL (ref 0.40–1.50)

## 2015-08-27 LAB — LIPASE: Lipase: 155 U/L — ABNORMAL HIGH (ref 11.0–59.0)

## 2015-08-27 NOTE — Patient Instructions (Addendum)
Your physician has requested that you go to the basement for lab work before leaving today.  You have been scheduled for an MRI/MRCP at [facility] on 09/04/15. Your appointment time is 11:00am. Please arrive 15 minutes prior to your appointment time for registration purposes. Please make certain not to have anything to eat or drink 4 hours prior to your test. In addition, if you have any metal in your body, have a pacemaker or defibrillator, please be sure to let your ordering physician know. This test typically takes 45 minutes to 1 hour to complete.  Lori Hvozdovic, PA recommends that you complete a bowel purge (to clean out your bowels). Please do the following: Purchase a bottle of Miralax over the counter as well as a box of 5 mg dulcolax tablets. Take 4 dulcolax tablets. Wait 1 hour. You will then drink 6-8 capfuls of Miralax mixed in an adequate amount of water/juice/gatorade (you may choose which of these liquids to drink) over the next 2-3 hours. You should expect results within 1 to 6 hours after completing the bowel purge.  Call [place] at [phone] tomorrow around lunch time is you do not have a BM after purge.   Once your bowels are cleaned out, please use Miralax 2 capfuls daily in water and titrate up or down as needed.  Let us know if you choose to proceed with the Cologuard or Colonoscopy. Brochure has been provided.

## 2015-08-27 NOTE — Progress Notes (Addendum)
Patient ID: Kevin Villegas, male   DOB: Jan 16, 1941, 74 y.o.   MRN: [id number]    HPI:  Kevin Villegas is a 74 y.o.   male  referred by Ezequiel Essex, MD for evaluation of a dilated pancreatic duct and constipation.  Kevin Villegas is currently in [place] but lives in [place] for half the year and [place] for the remainder of the year. He was admitted to the hospital in late December 2015 with biliary pancreatitis. He underwent a laparoscopic cholecystectomy with intraoperative cholangiogram on 12/16/2014 by Dr. Grandville Silos. He was noted to have mild gallbladder and fibrillation and a common bile duct stone which was flushed successfully during cholangiogram. He felt well until September 4 when he presented to the emergency room with constipation of 3 days duration. He states he would normally move his bowels on a daily basis but had been working outside a lot and became constipated. Abdominal CT in the emergency room revealed extensive diverticular disease and a dilated pancreatic duct, pancreatitis involving the tail, and he was noted to have an elevated lipase. He was instructed to follow-up with GI. He was instructed to use Mira lax one capful daily which he has been doing and he is still not moving his bowels. He has no abdominal pain but feels bloated. He has never had a colonoscopy and states he has no desire to have one. He denies a family history of colon cancer, colon polyps, or inflammatory bowel disease. He has not had any bright red blood per rectum or black tarry stools.   Past Medical History  Diagnosis Date  . Medical history non-contributory     Past Surgical History  Procedure Laterality Date  . Cholecystectomy N/A 12/16/2014    Procedure: LAPAROSCOPIC CHOLECYSTECTOMY WITH INTRAOPERATIVE CHOLANGIOGRAM;  Surgeon: Georganna Skeans, MD;  Location: [facility] OR;  Service: General;  Laterality: N/A;   Family History  Problem Relation Age of Onset  . Stroke Mother    Social History  Substance  Use Topics  . Smoking status: Never Smoker   . Smokeless tobacco: None  . Alcohol Use: None   Current Outpatient Prescriptions  Medication Sig Dispense Refill  . amLODipine (NORVASC) 10 MG tablet Take 0.5 tablets (5 mg total) by mouth daily. 30 tablet 0  . polyethylene glycol (MIRALAX) packet Take 17 g by mouth daily. 14 each 0   No current facility-administered medications for this visit.   No Known Allergies   Review of Systems: Gen: Denies any fever, chills, sweats, anorexia, fatigue, weakness, malaise, weight loss, and sleep disorder CV: Denies chest pain, angina, palpitations, syncope, orthopnea, PND, peripheral edema, and claudication. Resp: Denies dyspnea at rest, dyspnea with exercise, cough, sputum, wheezing, coughing up blood, and pleurisy. GI: Denies vomiting blood, jaundice, and fecal incontinence.   Denies dysphagia or odynophagia. GU : Denies urinary burning, blood in urine, urinary frequency, urinary hesitancy, nocturnal urination, and urinary incontinence. MS: Denies joint pain, limitation of movement, and swelling, stiffness, low back pain, extremity pain. Denies muscle weakness, cramps, atrophy.  Derm: Denies rash, itching, dry skin, hives, moles, warts, or unhealing ulcers.  Psych: Denies depression, anxiety, memory loss, suicidal ideation, hallucinations, paranoia, and confusion. Heme: Denies bruising, bleeding, and enlarged lymph nodes. Neuro:  Denies any headaches, dizziness, paresthesias. Endo:  Denies any problems with DM, thyroid, adrenal function  Studies: Ct Abdomen Pelvis W Contrast  08/23/2015   CLINICAL DATA:  Diffuse abdominal pain constipation for the past 3 days. Elevated lipase. Previous cholecystectomy.  EXAM: CT ABDOMEN AND PELVIS WITH  CONTRAST  TECHNIQUE: Multidetector CT imaging of the abdomen and pelvis was performed using the standard protocol following bolus administration of intravenous contrast.  CONTRAST:  161mL OMNIPAQUE IOHEXOL 300 MG/ML   SOLN  COMPARISON:  Radiographs obtained earlier today. Abdomen and pelvis CT dated 12/12/2014.  FINDINGS: Multiple small liver cysts are again demonstrated. Also noted are bilateral renal cysts, including bilateral parapelvic cysts, greater on the left compared to the right. Cholecystectomy clips. The distal pancreatic tail has ill-defined margins with peripancreatic soft tissue stranding. The pancreatic duct in that portion of the tail is also dilated, measuring 4.9 mm in diameter. The remainder the pancreas has a normal appearance. No fluid collections are seen.  Unremarkable spleen, accessory splenule, adrenal glands, ureters and urinary bladder. Mildly enlarged prostate gland. Large number of colonic diverticula without evidence of diverticulitis. No evidence of appendicitis. No enlarged lymph nodes. Small sliding hiatal hernia with mild diffuse wall thickening without significant change.  Clear lung bases. Lumbar and lower thoracic spine degenerative changes. Mild bilateral hip degenerative changes.  IMPRESSION: 1. Acute pancreatitis involving the distal tail of the pancreas. 2. Multiple liver and bilateral renal cysts. 3. Extensive colonic diverticulosis. 4. Small sliding hiatal hernia with mild diffuse chronic wall thickening.   Electronically Signed   By: Claudie Revering M.D.   On: 08/23/2015 17:57   Dg Abd Acute W/chest  08/23/2015   CLINICAL DATA:  Constipation after hitting over heated 3 days ago.  EXAM: DG ABDOMEN ACUTE W/ 1V CHEST  COMPARISON:  Abdomen and pelvis CT dated 12/12/2014.  FINDINGS: Normal sized heart. Clear lungs. Normal bowel gas pattern without free peritoneal air. Cholecystectomy clips. Lumbar and thoracic spine degenerative changes. Normal amount of stool.  IMPRESSION: No acute abnormality.   Electronically Signed   By: Claudie Revering M.D.   On: 08/23/2015 15:39    LAB RESULTS: Laboratory studies on 08/23/2015 revealed alkaline phosphatase 55, albumen 4.3, lipase 185, AST 40, ALT 38,  total bili 0.7 CBC white count 7.6, hemoglobin 15.8, hematocrit 46, platelets 198,000.    Physical Exam: BP 120/70 mmHg  Pulse 76  Ht 5\' 11"  (1.803 m)  Wt 170 lb 2 oz (77.168 kg)  BMI 23.74 kg/m2 Constitutional: Pleasant,well-developed, male in no acute distress. HEENT: Normocephalic and atraumatic. Conjunctivae are normal. No scleral icterus. Neck supple. No JVD Cardiovascular: Normal rate, regular rhythm.  Pulmonary/chest: Effort normal and breath sounds normal. No wheezing, rales or rhonchi. Abdominal: Soft, nondistended, nontender. Bowel sounds active throughout. There are no masses palpable. No hepatomegaly. Rectal: Deferred per patient Extremities: no edema Lymphadenopathy: No cervical adenopathy noted. Neurological: Alert and oriented to person place and time. Skin: Skin is warm and dry. No rashes noted. Psychiatric: Normal mood and affect. Behavior is normal.  ASSESSMENT AND PLAN: #1. Constipation. Patient will be given a Mira lax and Gatorade purge to use tonight. He has been instructed to call us tomorrow afternoon if he has not moved his bowels. After cleaning out with the purge, he is to start Mira lax one to 2 capfuls daily and has been instructed to titrate to the desired response. He has been instructed to increase water intake during the day.  #2. Pancreatitis with dilated pancreatic duct. Patient has no pain, nausea, or vomiting. He reports some mild decrease in diet over the past several weeks but his weight is been stable. Patient is to be scheduled for an MRCP for further evaluation of the pancreas and biliary tree.  #3. Colorectal cancer screening. We have discussed  the need for colorectal cancer screening with the patient. We have reviewed colonoscopy versus colo guard testing. Patient will think about it and let us know what his preference for colorectal cancer screening is in a few days.    Kevin Villegas, Vita Barley PA-C 08/27/2015, 4:24 PM  CC: Ezequiel Essex,  MD  Addendum: Reviewed and agree with initial management. Jerene Bears, MD

## 2015-09-04 ENCOUNTER — Ambulatory Visit (HOSPITAL_COMMUNITY)
Admission: RE | Admit: 2015-09-04 | Discharge: 2015-09-04 | Disposition: A | Discharge status: Home / Self Care | Payer: Medicare Other | Source: Ambulatory Visit | Attending: Physician Assistant | Admitting: Physician Assistant

## 2015-09-04 ENCOUNTER — Other Ambulatory Visit: Payer: Self-pay | Admitting: *Deleted

## 2015-09-04 ENCOUNTER — Other Ambulatory Visit: Payer: Self-pay | Admitting: Physician Assistant

## 2015-09-04 DIAGNOSIS — K868 Other specified diseases of pancreas: Secondary | ICD-10-CM | POA: Diagnosis not present

## 2015-09-04 DIAGNOSIS — K859 Acute pancreatitis, unspecified: Secondary | ICD-10-CM

## 2015-09-04 DIAGNOSIS — K8689 Other specified diseases of pancreas: Secondary | ICD-10-CM

## 2015-09-04 DIAGNOSIS — Z9049 Acquired absence of other specified parts of digestive tract: Secondary | ICD-10-CM | POA: Diagnosis not present

## 2015-09-04 DIAGNOSIS — N281 Cyst of kidney, acquired: Secondary | ICD-10-CM | POA: Insufficient documentation

## 2015-09-04 MED ORDER — LORAZEPAM 2 MG/ML IJ SOLN
INTRAMUSCULAR | Status: AC
  Filled 2015-09-04: qty 1

## 2015-09-04 MED ORDER — LORAZEPAM BOLUS VIA INFUSION
0.5000 mg | Freq: Once | INTRAVENOUS | Status: DC | PRN
  Administered 2015-09-04: 0.5 mg via INTRAVENOUS
  Filled 2015-09-04: qty 1

## 2015-09-08 MED ORDER — GADOBENATE DIMEGLUMINE 529 MG/ML IV SOLN
15.0000 mL | Freq: Once | INTRAVENOUS | Status: AC | PRN
  Administered 2015-09-04: 15 mL via INTRAVENOUS

## 2015-09-09 ENCOUNTER — Telehealth: Payer: Self-pay | Admitting: Physician Assistant

## 2015-09-10 NOTE — Telephone Encounter (Signed)
Spoke with patient and told him that [employer], PA-C had the results reviewed by Dr. Ardis Hughs and they will be calling to set up an EUS. Patient states he is feeling good and does not know if he wants to do the test but will talk with his wife. He will wait for Christian Mate to call and will tell her his decision.

## 2015-09-10 NOTE — Telephone Encounter (Signed)
Patient calling back. Requesting a callback regarding MRI results.

## 2015-09-10 NOTE — Telephone Encounter (Signed)
Dr Ardis Hughs the pt would like to speak to you before he schedules the EUS, He has several questions regarding the EUS and why he needs to have it and what will happen if he does not do anything.  He lives [place] hour away and would like to speak with you over the phone.

## 2015-09-11 NOTE — Telephone Encounter (Signed)
We spoke on the phone.  I reviewed his history, imaging again;  Abruptly abnormal pancreatic duct.  I recommended further evaluation with upper EUS and he understands, agrees  Chong Sicilian, He needs upper EUS, radial +/- linear, ++MAC, 10/6 EUS Thursday for abnormal pancreas, recent pancreatitis.  Would like to give the acute inflammation a bit more time to resolve and that is why not scheduling it for next week.  He prefers to be called at  [phone]  thanks

## 2015-09-15 ENCOUNTER — Other Ambulatory Visit: Payer: Self-pay

## 2015-09-15 DIAGNOSIS — R932 Abnormal findings on diagnostic imaging of liver and biliary tract: Secondary | ICD-10-CM

## 2015-09-15 DIAGNOSIS — K859 Acute pancreatitis, unspecified: Secondary | ICD-10-CM

## 2015-09-15 NOTE — Telephone Encounter (Signed)
EUS scheduled, pt instructed and medications reviewed.  Patient instructions mailed to home.  Patient to call with any questions or concerns.  

## 2015-09-16 ENCOUNTER — Encounter (HOSPITAL_COMMUNITY): Payer: Self-pay | Admitting: *Deleted

## 2015-09-21 NOTE — Anesthesia Preprocedure Evaluation (Addendum)
Anesthesia Evaluation  Patient identified by MRN, date of birth, ID band Patient awake    Reviewed: Allergy & Precautions, H&P , NPO status , Patient's Chart, lab work & pertinent test results, reviewed documented beta blocker date and time   Airway Mallampati: II  TM Distance: >3 FB Neck ROM: Full    Dental  (+) Teeth Intact   Pulmonary neg pulmonary ROS,    breath sounds clear to auscultation       Cardiovascular negative cardio ROS   Rhythm:Regular     Neuro/Psych negative neurological ROS     GI/Hepatic negative GI ROS, Gallstone Pancreatitis, LFT elevated   Endo/Other  negative endocrine ROS  Renal/GU negative Renal ROS     Musculoskeletal   Abdominal (+)  Abdomen: soft.    Peds  Hematology negative hematology ROS (+)   Anesthesia Other Findings Has dilated distal pancreatic duct, for [place] BX  Reproductive/Obstetrics                            Anesthesia Physical  Anesthesia Plan  ASA: II  Anesthesia Plan: MAC   Post-op Pain Management:    Induction: Intravenous  Airway Management Planned: Nasal Cannula  Additional Equipment:   Intra-op Plan:   Post-operative Plan: Extubation in OR  Informed Consent: I have reviewed the patients History and Physical, chart, labs and discussed the procedure including the risks, benefits and alternatives for the proposed anesthesia with the patient or authorized representative who has indicated his/her understanding and acceptance.     Plan Discussed with: Anesthesiologist and Surgeon  Anesthesia Plan Comments:         Anesthesia Quick Evaluation

## 2015-09-24 ENCOUNTER — Telehealth: Payer: Self-pay

## 2015-09-24 ENCOUNTER — Ambulatory Visit (HOSPITAL_COMMUNITY)
Admission: RE | Admit: 2015-09-24 | Discharge: 2015-09-24 | Disposition: A | Discharge status: Home / Self Care | Payer: Medicare Other | Source: Ambulatory Visit | Attending: Gastroenterology | Admitting: Gastroenterology

## 2015-09-24 ENCOUNTER — Ambulatory Visit (HOSPITAL_COMMUNITY): Payer: Medicare Other | Admitting: Anesthesiology

## 2015-09-24 ENCOUNTER — Encounter (HOSPITAL_COMMUNITY)
Admission: RE | Disposition: A | Discharge status: Home / Self Care | Payer: Self-pay | Source: Ambulatory Visit | Attending: Gastroenterology

## 2015-09-24 ENCOUNTER — Encounter (HOSPITAL_COMMUNITY): Payer: Self-pay

## 2015-09-24 DIAGNOSIS — K5900 Constipation, unspecified: Secondary | ICD-10-CM | POA: Insufficient documentation

## 2015-09-24 DIAGNOSIS — K8689 Other specified diseases of pancreas: Secondary | ICD-10-CM | POA: Diagnosis present

## 2015-09-24 DIAGNOSIS — K859 Acute pancreatitis without necrosis or infection, unspecified: Secondary | ICD-10-CM | POA: Diagnosis not present

## 2015-09-24 DIAGNOSIS — N281 Cyst of kidney, acquired: Secondary | ICD-10-CM | POA: Diagnosis not present

## 2015-09-24 DIAGNOSIS — R932 Abnormal findings on diagnostic imaging of liver and biliary tract: Secondary | ICD-10-CM

## 2015-09-24 DIAGNOSIS — K7689 Other specified diseases of liver: Secondary | ICD-10-CM | POA: Diagnosis not present

## 2015-09-24 HISTORY — PX: EUS: SHX5427

## 2015-09-24 SURGERY — UPPER ENDOSCOPIC ULTRASOUND (EUS) LINEAR
Anesthesia: Monitor Anesthesia Care

## 2015-09-24 MED ORDER — PROPOFOL 500 MG/50ML IV EMUL
INTRAVENOUS | Status: DC | PRN
  Administered 2015-09-24: 175 ug/kg/min via INTRAVENOUS

## 2015-09-24 MED ORDER — LIDOCAINE HCL (CARDIAC) 20 MG/ML IV SOLN
INTRAVENOUS | Status: AC
  Filled 2015-09-24: qty 5

## 2015-09-24 MED ORDER — PROPOFOL 10 MG/ML IV BOLUS
INTRAVENOUS | Status: AC
  Filled 2015-09-24: qty 20

## 2015-09-24 MED ORDER — PROMETHAZINE HCL 25 MG/ML IJ SOLN
6.2500 mg | INTRAMUSCULAR | Status: DC | PRN
Start: 2015-09-24 — End: 2015-09-24

## 2015-09-24 MED ORDER — LIDOCAINE HCL (CARDIAC) 20 MG/ML IV SOLN
INTRAVENOUS | Status: DC | PRN
  Administered 2015-09-24: 50 mg via INTRAVENOUS

## 2015-09-24 MED ORDER — PROPOFOL 10 MG/ML IV BOLUS
INTRAVENOUS | Status: DC | PRN
  Administered 2015-09-24: 30 mg via INTRAVENOUS
  Administered 2015-09-24 (×2): 20 mg via INTRAVENOUS
  Administered 2015-09-24: 10 mg via INTRAVENOUS
  Administered 2015-09-24: 20 mg via INTRAVENOUS

## 2015-09-24 MED ORDER — SODIUM CHLORIDE 0.9 % IV SOLN: INTRAVENOUS | Status: DC

## 2015-09-24 MED ORDER — MEPERIDINE HCL 100 MG/ML IJ SOLN: 6.2500 mg | INTRAMUSCULAR | Status: DC | PRN

## 2015-09-24 MED ORDER — LACTATED RINGERS IV SOLN
INTRAVENOUS | Status: DC | PRN
  Administered 2015-09-24 (×2): via INTRAVENOUS

## 2015-09-24 NOTE — Transfer of Care (Signed)
Immediate Anesthesia Transfer of Care Note  Patient: Kevin Villegas  Procedure(s) Performed: Procedure(s): UPPER ENDOSCOPIC ULTRASOUND (EUS) LINEAR (N/A)  Patient Location: ENDO  Anesthesia Type:MAC  Level of Consciousness:  sedated, patient cooperative and responds to stimulation  Airway & Oxygen Therapy:Patient Spontanous Breathing and Patient connected to face mask oxgen  Post-op Assessment:  Report given to ENDO RN and Post -op Vital signs reviewed and stable  Post vital signs:  Reviewed and stable  Last Vitals:  Filed Vitals:   09/24/15 0801  BP: 172/79  Pulse: 67  Temp: 36.6 C  Resp: 15    Complications: No apparent anesthesia complications

## 2015-09-24 NOTE — Anesthesia Postprocedure Evaluation (Signed)
  Anesthesia Post-op Note  Patient: Kevin Villegas  Procedure(s) Performed: Procedure(s): UPPER ENDOSCOPIC ULTRASOUND (EUS) LINEAR (N/A)  Patient Location: PACU  Anesthesia Type:MAC  Level of Consciousness: awake  Airway and Oxygen Therapy: Patient Spontanous Breathing  Post-op Pain: none  Post-op Assessment: Post-op Vital signs reviewed              Post-op Vital Signs: Reviewed and stable  Last Vitals:  Filed Vitals:   09/24/15 0801  BP: 172/79  Pulse: 67  Temp: 36.6 C  Resp: 15    Complications: No apparent anesthesia complications

## 2015-09-24 NOTE — Telephone Encounter (Signed)
-----   Message from Milus Banister, MD sent at 09/24/2015 10:21 AM EDT ----- Chong Sicilian, He needs referral to Dr. Barry Dienes to consider tail of pancreas resection for possible main duct IPMN.    Manuela Schwartz, Can you add him to next week GI tumor board.  Aris Everts,  thanks

## 2015-09-24 NOTE — Telephone Encounter (Signed)
Appt is scheduled for 10/12/15 Dr Barry Dienes 9 am arrive 34 am pt has been notified.

## 2015-09-24 NOTE — H&P (View-Only) (Signed)
Patient ID: Kevin Villegas, male   DOB: [birth year], 74 y.o.   MRN: [id number]    HPI:  Kevin Villegas is a 74 y.o.   male  referred by Ezequiel Essex, MD for evaluation of a dilated pancreatic duct and constipation.  Kevin Villegas is currently in [place] but lives in [place] for half the year and [place] for the remainder of the year. He was admitted to the hospital in late December 2015 with biliary pancreatitis. He underwent a laparoscopic cholecystectomy with intraoperative cholangiogram on 12/16/2014 by Dr. Grandville Silos. He was noted to have mild gallbladder and fibrillation and a common bile duct stone which was flushed successfully during cholangiogram. He felt well until September 4 when he presented to the emergency room with constipation of 3 days duration. He states he would normally move his bowels on a daily basis but had been working outside a lot and became constipated. Abdominal CT in the emergency room revealed extensive diverticular disease and a dilated pancreatic duct, pancreatitis involving the tail, and he was noted to have an elevated lipase. He was instructed to follow-up with GI. He was instructed to use Mira lax one capful daily which he has been doing and he is still not moving his bowels. He has no abdominal pain but feels bloated. He has never had a colonoscopy and states he has no desire to have one. He denies a family history of colon cancer, colon polyps, or inflammatory bowel disease. He has not had any bright red blood per rectum or black tarry stools.   Past Medical History  Diagnosis Date  . Medical history non-contributory     Past Surgical History  Procedure Laterality Date  . Cholecystectomy N/A 12/16/2014    Procedure: LAPAROSCOPIC CHOLECYSTECTOMY WITH INTRAOPERATIVE CHOLANGIOGRAM;  Surgeon: Georganna Skeans, MD;  Location[facility] OR;  Service: General;  Laterality: N/A;   Family History  Problem Relation Age of Onset  . Stroke Mother    Social History  Substance  Use Topics  . Smoking status: Never Smoker   . Smokeless tobacco: None  . Alcohol Use: None   Current Outpatient Prescriptions  Medication Sig Dispense Refill  . amLODipine (NORVASC) 10 MG tablet Take 0.5 tablets (5 mg total) by mouth daily. 30 tablet 0  . polyethylene glycol (MIRALAX) packet Take 17 g by mouth daily. 14 each 0   No current facility-administered medications for this visit.   No Known Allergies   Review of Systems: Gen: Denies any fever, chills, sweats, anorexia, fatigue, weakness, malaise, weight loss, and sleep disorder CV: Denies chest pain, angina, palpitations, syncope, orthopnea, PND, peripheral edema, and claudication. Resp: Denies dyspnea at rest, dyspnea with exercise, cough, sputum, wheezing, coughing up blood, and pleurisy. GI: Denies vomiting blood, jaundice, and fecal incontinence.   Denies dysphagia or odynophagia. GU : Denies urinary burning, blood in urine, urinary frequency, urinary hesitancy, nocturnal urination, and urinary incontinence. MS: Denies joint pain, limitation of movement, and swelling, stiffness, low back pain, extremity pain. Denies muscle weakness, cramps, atrophy.  Derm: Denies rash, itching, dry skin, hives, moles, warts, or unhealing ulcers.  Psych: Denies depression, anxiety, memory loss, suicidal ideation, hallucinations, paranoia, and confusion. Heme: Denies bruising, bleeding, and enlarged lymph nodes. Neuro:  Denies any headaches, dizziness, paresthesias. Endo:  Denies any problems with DM, thyroid, adrenal function  Studies: Ct Abdomen Pelvis W Contrast  08/23/2015   CLINICAL DATA:  Diffuse abdominal pain constipation for the past 3 days. Elevated lipase. Previous cholecystectomy.  EXAM: CT ABDOMEN AND PELVIS WITH  CONTRAST  TECHNIQUE: Multidetector CT imaging of the abdomen and pelvis was performed using the standard protocol following bolus administration of intravenous contrast.  CONTRAST:  1102mL OMNIPAQUE IOHEXOL 300 MG/ML   SOLN  COMPARISON:  Radiographs obtained earlier today. Abdomen and pelvis CT dated 12/12/2014.  FINDINGS: Multiple small liver cysts are again demonstrated. Also noted are bilateral renal cysts, including bilateral parapelvic cysts, greater on the left compared to the right. Cholecystectomy clips. The distal pancreatic tail has ill-defined margins with peripancreatic soft tissue stranding. The pancreatic duct in that portion of the tail is also dilated, measuring 4.9 mm in diameter. The remainder the pancreas has a normal appearance. No fluid collections are seen.  Unremarkable spleen, accessory splenule, adrenal glands, ureters and urinary bladder. Mildly enlarged prostate gland. Large number of colonic diverticula without evidence of diverticulitis. No evidence of appendicitis. No enlarged lymph nodes. Small sliding hiatal hernia with mild diffuse wall thickening without significant change.  Clear lung bases. Lumbar and lower thoracic spine degenerative changes. Mild bilateral hip degenerative changes.  IMPRESSION: 1. Acute pancreatitis involving the distal tail of the pancreas. 2. Multiple liver and bilateral renal cysts. 3. Extensive colonic diverticulosis. 4. Small sliding hiatal hernia with mild diffuse chronic wall thickening.   Electronically Signed   By: Claudie Revering M.D.   On: 08/23/2015 17:57   Dg Abd Acute W/chest  08/23/2015   CLINICAL DATA:  Constipation after hitting over heated 3 days ago.  EXAM: DG ABDOMEN ACUTE W/ 1V CHEST  COMPARISON:  Abdomen and pelvis CT dated 12/12/2014.  FINDINGS: Normal sized heart. Clear lungs. Normal bowel gas pattern without free peritoneal air. Cholecystectomy clips. Lumbar and thoracic spine degenerative changes. Normal amount of stool.  IMPRESSION: No acute abnormality.   Electronically Signed   By: Claudie Revering M.D.   On: 08/23/2015 15:39    LAB RESULTS: Laboratory studies on 08/23/2015 revealed alkaline phosphatase 55, albumen 4.3, lipase 185, AST 40, ALT 38,  total bili 0.7 CBC white count 7.6, hemoglobin 15.8, hematocrit 46, platelets 198,000.    Physical Exam: BP 120/70 mmHg  Pulse 76  Ht 5\' 11"  (1.803 m)  Wt 170 lb 2 oz (77.168 kg)  BMI 23.74 kg/m2 Constitutional: Pleasant,well-developed, male in no acute distress. HEENT: Normocephalic and atraumatic. Conjunctivae are normal. No scleral icterus. Neck supple. No JVD Cardiovascular: Normal rate, regular rhythm.  Pulmonary/chest: Effort normal and breath sounds normal. No wheezing, rales or rhonchi. Abdominal: Soft, nondistended, nontender. Bowel sounds active throughout. There are no masses palpable. No hepatomegaly. Rectal: Deferred per patient Extremities: no edema Lymphadenopathy: No cervical adenopathy noted. Neurological: Alert and oriented to person place and time. Skin: Skin is warm and dry. No rashes noted. Psychiatric: Normal mood and affect. Behavior is normal.  ASSESSMENT AND PLAN: #1. Constipation. Patient will be given a Mira lax and Gatorade purge to use tonight. He has been instructed to call us tomorrow afternoon if he has not moved his bowels. After cleaning out with the purge, he is to start Mira lax one to 2 capfuls daily and has been instructed to titrate to the desired response. He has been instructed to increase water intake during the day.  #2. Pancreatitis with dilated pancreatic duct. Patient has no pain, nausea, or vomiting. He reports some mild decrease in diet over the past several weeks but his weight is been stable. Patient is to be scheduled for an MRCP for further evaluation of the pancreas and biliary tree.  #3. Colorectal cancer screening. We have discussed  the need for colorectal cancer screening with the patient. We have reviewed colonoscopy versus colo guard testing. Patient will think about it and let us know what his preference for colorectal cancer screening is in a few days.    Cindi Ghazarian, Vita Barley PA-C 08/27/2015, 4:24 PM  CC: Ezequiel Essex,  MD  Addendum: Reviewed and agree with initial management. Jerene Bears, MD

## 2015-09-24 NOTE — Discharge Instructions (Signed)
YOU HAD AN ENDOSCOPIC PROCEDURE TODAY: Refer to the procedure report that was given to you for any specific questions about what was found during the examination.  If the procedure report does not answer your questions, please call your gastroenterologist to clarify. ° °YOU SHOULD EXPECT: Some feelings of bloating in the abdomen. Passage of more gas than usual.  Walking can help get rid of the air that was put into your GI tract during the procedure and reduce the bloating. If you had a lower endoscopy (such as a colonoscopy or flexible sigmoidoscopy) you may notice spotting of blood in your stool or on the toilet paper.  ° °DIET: Your first meal following the procedure should be a light meal and then it is ok to progress to your normal diet.  A half-sandwich or bowl of soup is an example of a good first meal.  Heavy or fried foods are harder to digest and may make you feel nasueas or bloated.  Drink plenty of fluids but you should avoid alcoholic beverages for 24 hours. ° °ACTIVITY: Your care partner should take you home directly after the procedure.  You should plan to take it easy, moving slowly for the rest of the day.  You can resume normal activity the day after the procedure however you should NOT DRIVE or use heavy machinery for 24 hours (because of the sedation medicines used during the test).   ° °SYMPTOMS TO REPORT IMMEDIATELY  °A gastroenterologist can be reached at any hour.  Please call your doctor's office for any of the following symptoms: ° °· Following lower endoscopy (colonoscopy, flexible sigmoidoscopy) ° Excessive amounts of blood in the stool ° Significant tenderness, worsening of abdominal pains ° Swelling of the abdomen that is new, acute ° Fever of 100° or higher °· Following upper endoscopy (EGD, EUS, ERCP) ° Vomiting of blood or coffee ground material ° New, significant abdominal pain ° New, significant chest pain or pain under the shoulder blades ° Painful or persistently difficult  swallowing ° New shortness of breath ° Black, tarry-looking stools ° °FOLLOW UP: °If any biopsies were taken you will be contacted by phone or by letter within the next 1-3 weeks.  Call your gastroenterologist if you have not heard about the biopsies in 3 weeks.  °Please also call your gastroenterologist's office with any specific questions about appointments or follow up tests. °Esophagogastroduodenoscopy, Care After °Refer to this sheet in the next few weeks. These instructions provide you with information about caring for yourself after your procedure. Your health care provider may also give you more specific instructions. Your treatment has been planned according to current medical practices, but problems sometimes occur. Call your health care provider if you have any problems or questions after your procedure. °WHAT TO EXPECT AFTER THE PROCEDURE °After your procedure, it is typical to feel: °· Soreness in your throat. °· Pain with swallowing. °· Sick to your stomach (nauseous). °· Bloated. °· Dizzy. °· Fatigued. °HOME CARE INSTRUCTIONS °· Do not eat or drink anything until the numbing medicine (local anesthetic) has worn off and your gag reflex has returned. You will know that the local anesthetic has worn off when you can swallow comfortably. °· Do not drive or operate machinery until directed by your health care provider. °· Take medicines only as directed by your health care provider. °SEEK MEDICAL CARE IF:  °· You cannot stop coughing. °· You are not urinating at all or less than usual. °SEEK IMMEDIATE MEDICAL CARE IF: °·   You have difficulty swallowing. °· You cannot eat or drink. °· You have worsening throat or chest pain. °· You have dizziness or lightheadedness or you faint. °· You have nausea or vomiting. °· You have chills. °· You have a fever. °· You have severe abdominal pain. °· You have black, tarry, or bloody stools. °  °This information is not intended to replace advice given to you by your  health care provider. Make sure you discuss any questions you have with your health care provider. °  °Document Released: 11/21/2012 Document Revised: 12/26/2014 Document Reviewed: 11/21/2012 °Elsevier Interactive Patient Education ©2016 [employer] ° °

## 2015-09-24 NOTE — Interval H&P Note (Signed)
History and Physical Interval Note:  09/24/2015 8:16 AM  Kevin Villegas  has presented today for surgery, with the diagnosis of abnormal pancreas and pancreatitis  The various methods of treatment have been discussed with the patient and family. After consideration of risks, benefits and other options for treatment, the patient has consented to  Procedure(s): UPPER ENDOSCOPIC ULTRASOUND (EUS) LINEAR (N/A) as a surgical intervention .  The patient's history has been reviewed, patient examined, no change in status, stable for surgery.  I have reviewed the patient's chart and labs.  Questions were answered to the patient's satisfaction.     Milus Banister

## 2015-09-24 NOTE — Op Note (Signed)
[facility] [place], [address]   ENDOSCOPIC ULTRASOUND PROCEDURE REPORT  PATIENT: Kevin Villegas, Kevin Villegas  MR#: [id number] BIRTHDATE: 10-09-1941  GENDER: male ENDOSCOPIST: Milus Banister, MD REFERRED BY:  Dr. Wyvonnia Dusky, MD PROCEDURE DATE:  09/24/2015 PROCEDURE:   Upper EUS w/FNA ASA CLASS:      Class II INDICATIONS:   1.  acute biliary pancreatitis 11/2014; s/p lap chole that was uneventful; repeat imaging and ?vague pains showed abnromal tail of pancreas with abruptly dilated main pancreatic duct. MEDICATIONS: Monitored anesthesia care  DESCRIPTION OF PROCEDURE:   After the risks benefits and alternatives of the procedure were  explained, informed consent was obtained. The patient was then placed in the left, lateral, decubitus postion and IV sedation was administered. Throughout the procedure, the patients blood pressure, pulse and oxygen saturations were monitored continuously.  Under direct visualization, the Pentax Radial EUS [id number]  endoscope was introduced through the mouth  and advanced to the second portion of the duodenum .  Water was used as necessary to provide an acoustic interface.  Upon completion of the imaging, water was removed and the patient was sent to the recovery room in satisfactory condition.  Endoscopic findings: 1. Normal UGI tract  EUS findings: 1. The head, neck, body of pancreas was normal. The main pancreatic duct in this region was normal as well. 2. Abnormal tail of pancreas.  The parenchyma in tail of pancreas was hypoechoic and vaguely mass like (for 1.8cm). In this region the main pancreatic duct becomes dilated and thickened appearing by EUS.  The mass-like region was sampled with 2 transgastric passes with a 25 guage EUS FNA needle. The splenic artery lays interposed between the target and gastric wall making FNA technically challanging. 3. There is what appears to be a soft tissue mass or thrombus within the  splenic vein along the same region as the abnormal parenchyma described above. 4. No peripancreatic adenopathy. 5. The CBD was normal; non-dilated and without stones. 6. Limited views of liver, spleen were all normal.  ENDOSCOPIC IMPRESSION: Abnormal, mass-like pancreatic parenchyma in tail of pancreas associated with abruptly dilated main pancreatic duct as well as ?soft tissue vs thrombus in the splenic vein.  Although these findings may be from a benign process I am concerned about Main Duct IPMN which has signficant malignant potential.  I will refer him to pancreatic surgeon to consider distal pancreatectomy. Will also present at next week multidisciplinary GI tumor conference. Await final cytology.  RECOMMENDATIONS: as above  _______________________________ eSignedMilus Banister, MD 09/24/2015 10:19 AM   PATIENT NAME:  Kevin Villegas, Kevin Villegas MR#: [id number]

## 2015-09-25 ENCOUNTER — Encounter (HOSPITAL_COMMUNITY): Payer: Self-pay | Admitting: Gastroenterology

## 2015-10-20 ENCOUNTER — Other Ambulatory Visit: Payer: Self-pay | Admitting: General Surgery

## 2016-01-14 DIAGNOSIS — R1030 Lower abdominal pain, unspecified: Secondary | ICD-10-CM | POA: Diagnosis not present

## 2016-01-14 DIAGNOSIS — K869 Disease of pancreas, unspecified: Secondary | ICD-10-CM | POA: Diagnosis not present

## 2016-02-14 DIAGNOSIS — R1084 Generalized abdominal pain: Secondary | ICD-10-CM | POA: Diagnosis not present

## 2016-02-14 DIAGNOSIS — K859 Acute pancreatitis without necrosis or infection, unspecified: Secondary | ICD-10-CM | POA: Diagnosis not present

## 2016-02-15 DIAGNOSIS — K861 Other chronic pancreatitis: Secondary | ICD-10-CM | POA: Diagnosis not present

## 2016-02-15 DIAGNOSIS — Z8711 Personal history of peptic ulcer disease: Secondary | ICD-10-CM | POA: Diagnosis not present

## 2016-02-15 DIAGNOSIS — K859 Acute pancreatitis without necrosis or infection, unspecified: Secondary | ICD-10-CM | POA: Diagnosis not present

## 2016-02-15 DIAGNOSIS — K2970 Gastritis, unspecified, without bleeding: Secondary | ICD-10-CM | POA: Diagnosis not present

## 2016-02-15 DIAGNOSIS — R1084 Generalized abdominal pain: Secondary | ICD-10-CM | POA: Diagnosis not present

## 2016-03-08 DIAGNOSIS — R109 Unspecified abdominal pain: Secondary | ICD-10-CM | POA: Diagnosis not present

## 2016-03-08 DIAGNOSIS — E785 Hyperlipidemia, unspecified: Secondary | ICD-10-CM | POA: Diagnosis not present

## 2016-03-08 DIAGNOSIS — J309 Allergic rhinitis, unspecified: Secondary | ICD-10-CM | POA: Diagnosis not present

## 2016-10-11 DIAGNOSIS — Z23 Encounter for immunization: Secondary | ICD-10-CM | POA: Diagnosis not present

## 2017-03-07 ENCOUNTER — Telehealth: Payer: Self-pay | Admitting: Pediatrics

## 2017-03-07 NOTE — Telephone Encounter (Signed)
Appt  Made for same day but not back to back

## 2017-03-22 ENCOUNTER — Encounter: Payer: Self-pay | Admitting: Pediatrics

## 2017-03-22 ENCOUNTER — Ambulatory Visit (INDEPENDENT_AMBULATORY_CARE_PROVIDER_SITE_OTHER): Payer: Medicare Other | Admitting: Pediatrics

## 2017-03-22 VITALS — BP 152/92 | HR 67 | Temp 97.0°F | Ht 71.0 in | Wt 171.4 lb

## 2017-03-22 DIAGNOSIS — Z23 Encounter for immunization: Secondary | ICD-10-CM | POA: Diagnosis not present

## 2017-03-22 DIAGNOSIS — R1013 Epigastric pain: Secondary | ICD-10-CM

## 2017-03-22 DIAGNOSIS — R030 Elevated blood-pressure reading, without diagnosis of hypertension: Secondary | ICD-10-CM

## 2017-03-22 DIAGNOSIS — E785 Hyperlipidemia, unspecified: Secondary | ICD-10-CM

## 2017-03-22 DIAGNOSIS — Z1211 Encounter for screening for malignant neoplasm of colon: Secondary | ICD-10-CM | POA: Diagnosis not present

## 2017-03-22 NOTE — Progress Notes (Addendum)
Subjective:   Patient ID: Kevin Villegas, male    DOB: 1941/09/10, 76 y.o.   MRN: [id number] CC: New Patient (Initial Visit) f/u med problems. HPI: Kevin Villegas is a 76 y.o. male presenting for New Patient (Initial Visit)  Cholecystecomy two years ago, abnormal CT scan at that time, then found to have mass-like pancreatic parenchyma in tail of pancreas on EUS in 2016., FNA neg, surgery rec to remove tail of pancreas given concern for malignant potential. Pt moved to [place] soon after the recommendation, followed up with specialists in [place]. Per pt after discussion and possible further work up they decided not to proceed with surgery. These records are not available to me now. He is feeling well now. He was having some indigestion off and on, discomfort in upper abd, past two months has been back to normal self, asymptomatic No weight loss, normal appetite, normal stooling Pt says he is not interested in further work up now. No fevers. Generally healthy. Not taking any medications regularly Says he has been told he has elevated cholesterol levels in the past, he has opted not to take medication. Here to establish care. Stays active, no HA, CP, SOB Says blood pressures at home are usually 924Q systolic/70s  PMH: no h/o MI, CVA Family History  Problem Relation Age of Onset  . Stroke Mother   no fam h/o prostate, colon cancer.  Social History   Social History  . Marital status: Married    Spouse name: N/A  . Number of children: 2  . Years of education: N/A   Occupational History  . retired    Social History Main Topics  . Smoking status: Never Smoker  . Smokeless tobacco: Never Used  . Alcohol use No  . Drug use: No  . Sexual activity: Yes   Other Topics Concern  . None   Social History Narrative  . None   ROS: All systems negative other than what is in HPI  Objective:    BP (!) 152/92   Pulse 67   Temp 97 F (36.1 C) (Oral)   Ht 5' 11"  (1.803 m)   Wt 171 lb  6.4 oz (77.7 kg)   BMI 23.91 kg/m   Wt Readings from Last 3 Encounters:  03/22/17 171 lb 6.4 oz (77.7 kg)  09/24/15 170 lb (77.1 kg)  08/27/15 170 lb 2 oz (77.2 kg)    Gen: NAD, alert, cooperative with exam, NCAT EYES: EOMI, no conjunctival injection, or no icterus ENT:  TMs pearly gray b/l, OP without erythema LYMPH: no cervical LAD CV: NRRR, normal S1/S2, no murmur, distal pulses 2+ b/l Resp: CTABL, no wheezes, normal WOB Abd: +BS, soft, NTND. no guarding or organomegaly Ext: No edema, warm Neuro: Alert and oriented, strength equal b/l UE and LE, coordination grossly normal MSK: normal muscle bulk  Assessment & Plan:  Almus was seen today for new patient (initial visit) and follow up med problems.  Diagnoses and all orders for this visit:  Elevated blood pressure reading Improved numbers at home Cont to check at home Bring to next visit  Hyperlipidemia, unspecified hyperlipidemia type Not on statin now, fam h/o CVA in mother -     Lipid panel  Epigastric pain Asymptomatic now Pt says he is not interested in further work up of pancreatic findings at this time -     CMP14+EGFR -     CBC with Differential/Platelet  Screening for colon cancer Declines colonoscopy -     Fecal occult  blood, imunochemical; Future  Need for Tdap vaccination -     Tdap vaccine greater than or equal to 7yo IM   Follow up plan: 6 mo Assunta Found, MD [facility]  ADDENDUM: Continues to have intermittent flares of total abd/intestinal discomfort, most recently evening after this clinic visit. He says at worst pain is 2/10. Lasted for a few days then went away. In [place] had additional imaging of pancreas that I do not have access to yet, per pt he says he was told he had a pseudocyst in his pancreas that seemed ot be causing all of his problems. Recommended to not have surgery at that time. Last imaging was over a year ago. Urine gets dark when he has these episodes.  Will repeat CT scan to eval pseudocyst/prior abnormality seen in tail of pancreas.

## 2017-03-23 ENCOUNTER — Other Ambulatory Visit: Payer: Medicare Other

## 2017-03-23 DIAGNOSIS — Z1211 Encounter for screening for malignant neoplasm of colon: Secondary | ICD-10-CM

## 2017-03-23 LAB — CBC WITH DIFFERENTIAL/PLATELET
BASOS: 0 %
Basophils Absolute: 0 10*3/uL (ref 0.0–0.2)
EOS (ABSOLUTE): 0.2 10*3/uL (ref 0.0–0.4)
EOS: 3 %
HEMATOCRIT: 42.6 % (ref 37.5–51.0)
HEMOGLOBIN: 14.8 g/dL (ref 13.0–17.7)
IMMATURE GRANS (ABS): 0 10*3/uL (ref 0.0–0.1)
IMMATURE GRANULOCYTES: 0 %
LYMPHS: 27 %
Lymphocytes Absolute: 2.2 10*3/uL (ref 0.7–3.1)
MCH: 31.8 pg (ref 26.6–33.0)
MCHC: 34.7 g/dL (ref 31.5–35.7)
MCV: 91 fL (ref 79–97)
MONOCYTES: 9 %
Monocytes Absolute: 0.8 10*3/uL (ref 0.1–0.9)
NEUTROS ABS: 5 10*3/uL (ref 1.4–7.0)
NEUTROS PCT: 61 %
PLATELETS: 173 10*3/uL (ref 150–379)
RBC: 4.66 x10E6/uL (ref 4.14–5.80)
RDW: 14.1 % (ref 12.3–15.4)
WBC: 8.2 10*3/uL (ref 3.4–10.8)

## 2017-03-23 LAB — CMP14+EGFR
A/G RATIO: 1.9 (ref 1.2–2.2)
ALT: 16 IU/L (ref 0–44)
AST: 25 IU/L (ref 0–40)
Albumin: 4.5 g/dL (ref 3.5–4.8)
Alkaline Phosphatase: 66 IU/L (ref 39–117)
BUN/Creatinine Ratio: 17 (ref 10–24)
BUN: 17 mg/dL (ref 8–27)
Bilirubin Total: 0.5 mg/dL (ref 0.0–1.2)
CALCIUM: 9.7 mg/dL (ref 8.6–10.2)
CO2: 26 mmol/L (ref 18–29)
CREATININE: 1.01 mg/dL (ref 0.76–1.27)
Chloride: 100 mmol/L (ref 96–106)
GFR, EST AFRICAN AMERICAN: 84 mL/min/{1.73_m2} (ref >59)
GFR, EST NON AFRICAN AMERICAN: 72 mL/min/{1.73_m2} (ref >59)
Globulin, Total: 2.4 g/dL (ref 1.5–4.5)
Glucose: 93 mg/dL (ref 65–99)
Potassium: 4.9 mmol/L (ref 3.5–5.2)
Sodium: 141 mmol/L (ref 134–144)
Total Protein: 6.9 g/dL (ref 6.0–8.5)

## 2017-03-23 LAB — LIPID PANEL
CHOL/HDL RATIO: 2.3 ratio (ref 0.0–5.0)
Cholesterol, Total: 203 mg/dL — ABNORMAL HIGH (ref 100–199)
HDL: 90 mg/dL (ref >39)
LDL CALC: 97 mg/dL (ref 0–99)
Triglycerides: 79 mg/dL (ref 0–149)
VLDL CHOLESTEROL CAL: 16 mg/dL (ref 5–40)

## 2017-03-25 LAB — FECAL OCCULT BLOOD, IMMUNOCHEMICAL: FECAL OCCULT BLD: NEGATIVE

## 2017-03-29 ENCOUNTER — Telehealth: Payer: Self-pay | Admitting: Pediatrics

## 2017-03-30 ENCOUNTER — Encounter: Payer: Self-pay | Admitting: Pediatrics

## 2017-03-30 NOTE — Addendum Note (Signed)
Addended by: Eustaquio Maize on: 03/30/2017 05:29 PM   Modules accepted: Orders

## 2017-03-30 NOTE — Telephone Encounter (Signed)
lmtcb

## 2017-04-03 ENCOUNTER — Encounter: Payer: Self-pay | Admitting: Pediatrics

## 2017-04-04 NOTE — Telephone Encounter (Signed)
Patient aware of results.

## 2017-04-12 ENCOUNTER — Ambulatory Visit (HOSPITAL_COMMUNITY)
Admission: RE | Admit: 2017-04-12 | Discharge: 2017-04-12 | Disposition: A | Discharge status: Home / Self Care | Payer: Medicare Other | Source: Ambulatory Visit | Attending: Pediatrics | Admitting: Pediatrics

## 2017-04-12 DIAGNOSIS — R1013 Epigastric pain: Secondary | ICD-10-CM

## 2017-04-12 DIAGNOSIS — K869 Disease of pancreas, unspecified: Secondary | ICD-10-CM | POA: Insufficient documentation

## 2017-04-12 DIAGNOSIS — K8689 Other specified diseases of pancreas: Secondary | ICD-10-CM | POA: Diagnosis not present

## 2017-04-12 MED ORDER — IOPAMIDOL (ISOVUE-300) INJECTION 61%
100.0000 mL | Freq: Once | INTRAVENOUS | Status: AC | PRN
  Administered 2017-04-12: 100 mL via INTRAVENOUS

## 2017-04-13 ENCOUNTER — Telehealth: Payer: Self-pay | Admitting: Pediatrics

## 2017-04-13 DIAGNOSIS — K8689 Other specified diseases of pancreas: Secondary | ICD-10-CM

## 2017-04-13 NOTE — Telephone Encounter (Signed)
Spoke with pt about CT scan results with a pancreatic mass highly suspicious for cancer and need to refer for further evaluation. He is concerned that he will be told he has to have surgery like he was told 2 yrs ago while in [place], only to be told in 2017 while in [place] that there is nothing wrong with his pancreas other than the pseudocyst and that he doesn't have cancer. Pt open to meeting with specialist, referral placed.   Working on getting records form [place], PCP Dr. Ova Freshwater with [facility].

## 2017-04-18 ENCOUNTER — Encounter: Payer: Self-pay | Admitting: Pediatrics

## 2017-04-25 ENCOUNTER — Telehealth: Payer: Self-pay | Admitting: Pediatrics

## 2017-04-25 DIAGNOSIS — K869 Disease of pancreas, unspecified: Secondary | ICD-10-CM | POA: Diagnosis not present

## 2017-04-25 DIAGNOSIS — C259 Malignant neoplasm of pancreas, unspecified: Secondary | ICD-10-CM | POA: Diagnosis not present

## 2017-04-25 DIAGNOSIS — Z8719 Personal history of other diseases of the digestive system: Secondary | ICD-10-CM | POA: Diagnosis not present

## 2017-04-25 NOTE — Telephone Encounter (Signed)
Returned call, has appt with oncology later today.

## 2017-04-26 DIAGNOSIS — C259 Malignant neoplasm of pancreas, unspecified: Secondary | ICD-10-CM | POA: Diagnosis not present

## 2017-04-27 ENCOUNTER — Encounter: Payer: Self-pay | Admitting: Pediatrics

## 2017-05-09 DIAGNOSIS — K2970 Gastritis, unspecified, without bleeding: Secondary | ICD-10-CM | POA: Diagnosis not present

## 2017-05-09 DIAGNOSIS — C251 Malignant neoplasm of body of pancreas: Secondary | ICD-10-CM | POA: Diagnosis not present

## 2017-05-09 DIAGNOSIS — C252 Malignant neoplasm of tail of pancreas: Secondary | ICD-10-CM | POA: Diagnosis not present

## 2017-05-09 DIAGNOSIS — K869 Disease of pancreas, unspecified: Secondary | ICD-10-CM | POA: Diagnosis not present

## 2017-05-09 DIAGNOSIS — C258 Malignant neoplasm of overlapping sites of pancreas: Secondary | ICD-10-CM | POA: Diagnosis not present

## 2017-05-11 ENCOUNTER — Encounter: Payer: Self-pay | Admitting: Pediatrics

## 2017-05-16 DIAGNOSIS — C251 Malignant neoplasm of body of pancreas: Secondary | ICD-10-CM | POA: Diagnosis not present

## 2017-05-18 ENCOUNTER — Encounter: Payer: Self-pay | Admitting: Pediatrics

## 2017-05-18 DIAGNOSIS — C251 Malignant neoplasm of body of pancreas: Secondary | ICD-10-CM | POA: Diagnosis not present

## 2017-05-23 DIAGNOSIS — D735 Infarction of spleen: Secondary | ICD-10-CM | POA: Diagnosis not present

## 2017-05-23 DIAGNOSIS — C251 Malignant neoplasm of body of pancreas: Secondary | ICD-10-CM | POA: Diagnosis not present

## 2017-05-23 DIAGNOSIS — K7689 Other specified diseases of liver: Secondary | ICD-10-CM | POA: Diagnosis not present

## 2017-05-23 DIAGNOSIS — I2510 Atherosclerotic heart disease of native coronary artery without angina pectoris: Secondary | ICD-10-CM | POA: Diagnosis not present

## 2017-05-23 DIAGNOSIS — C259 Malignant neoplasm of pancreas, unspecified: Secondary | ICD-10-CM | POA: Diagnosis not present

## 2017-05-23 DIAGNOSIS — K8689 Other specified diseases of pancreas: Secondary | ICD-10-CM | POA: Diagnosis not present

## 2017-05-26 DIAGNOSIS — Z452 Encounter for adjustment and management of vascular access device: Secondary | ICD-10-CM | POA: Diagnosis not present

## 2017-05-26 DIAGNOSIS — Z5111 Encounter for antineoplastic chemotherapy: Secondary | ICD-10-CM | POA: Diagnosis not present

## 2017-05-26 DIAGNOSIS — D701 Agranulocytosis secondary to cancer chemotherapy: Secondary | ICD-10-CM | POA: Diagnosis not present

## 2017-05-26 DIAGNOSIS — T451X5A Adverse effect of antineoplastic and immunosuppressive drugs, initial encounter: Secondary | ICD-10-CM | POA: Diagnosis not present

## 2017-05-26 DIAGNOSIS — C259 Malignant neoplasm of pancreas, unspecified: Secondary | ICD-10-CM | POA: Diagnosis not present

## 2017-05-29 DIAGNOSIS — C259 Malignant neoplasm of pancreas, unspecified: Secondary | ICD-10-CM | POA: Diagnosis not present

## 2017-05-29 DIAGNOSIS — T451X5A Adverse effect of antineoplastic and immunosuppressive drugs, initial encounter: Secondary | ICD-10-CM | POA: Diagnosis not present

## 2017-05-29 DIAGNOSIS — D701 Agranulocytosis secondary to cancer chemotherapy: Secondary | ICD-10-CM | POA: Diagnosis not present

## 2017-05-29 DIAGNOSIS — Z5111 Encounter for antineoplastic chemotherapy: Secondary | ICD-10-CM | POA: Diagnosis not present

## 2017-05-30 DIAGNOSIS — D701 Agranulocytosis secondary to cancer chemotherapy: Secondary | ICD-10-CM | POA: Diagnosis not present

## 2017-05-30 DIAGNOSIS — Z5111 Encounter for antineoplastic chemotherapy: Secondary | ICD-10-CM | POA: Diagnosis not present

## 2017-05-30 DIAGNOSIS — C259 Malignant neoplasm of pancreas, unspecified: Secondary | ICD-10-CM | POA: Diagnosis not present

## 2017-05-30 DIAGNOSIS — T451X5A Adverse effect of antineoplastic and immunosuppressive drugs, initial encounter: Secondary | ICD-10-CM | POA: Diagnosis not present

## 2017-05-31 DIAGNOSIS — Z5111 Encounter for antineoplastic chemotherapy: Secondary | ICD-10-CM | POA: Diagnosis not present

## 2017-05-31 DIAGNOSIS — D701 Agranulocytosis secondary to cancer chemotherapy: Secondary | ICD-10-CM | POA: Diagnosis not present

## 2017-05-31 DIAGNOSIS — C259 Malignant neoplasm of pancreas, unspecified: Secondary | ICD-10-CM | POA: Diagnosis not present

## 2017-05-31 DIAGNOSIS — T451X5A Adverse effect of antineoplastic and immunosuppressive drugs, initial encounter: Secondary | ICD-10-CM | POA: Diagnosis not present

## 2017-06-04 IMAGING — CT CT ABD-PELV W/ CM
2 of 5 series · 11 of 46 positions shown, 12 images · IV contrast (Iodine)
Comparison: Radiographs obtained earlier today. Abdomen and pelvis
CT dated 12/12/2014.

CLINICAL DATA: Diffuse abdominal pain constipation for the past 3
days. Elevated lipase. Previous cholecystectomy.

EXAM:
CT ABDOMEN AND PELVIS WITH CONTRAST
TECHNIQUE: Multidetector CT imaging of the abdomen and pelvis was performed
using the standard protocol following bolus administration of
intravenous contrast.
CONTRAST:  100mL OMNIPAQUE IOHEXOL 300 MG/ML  SOLN

[Series 201: routine, idose (2) · axial · 0.84mm/px · z∈[+77,+447]mm · 8 of 91 slices shown, 9 images]
[im 11/91  soft-tissue]
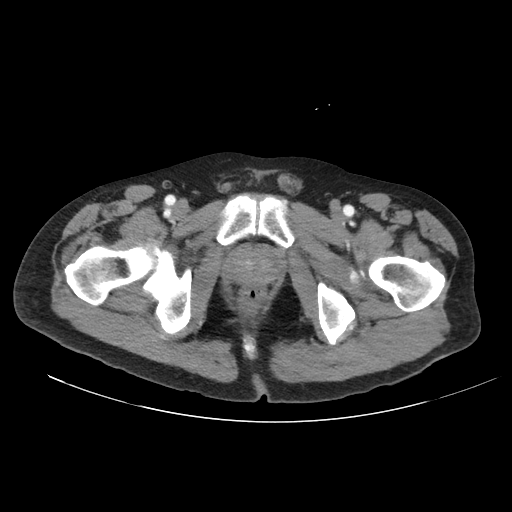
[im 11/91  bone]
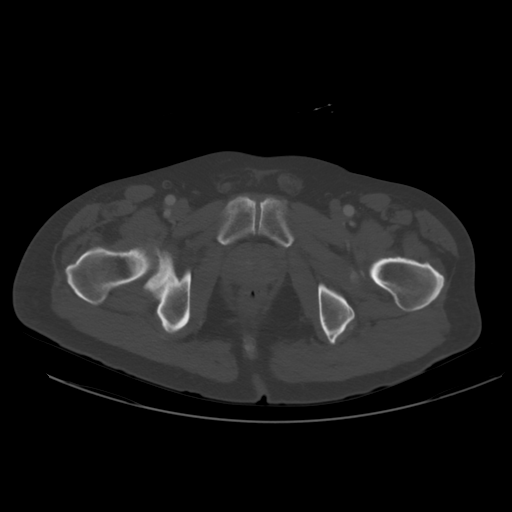
[im 22/91  soft-tissue]
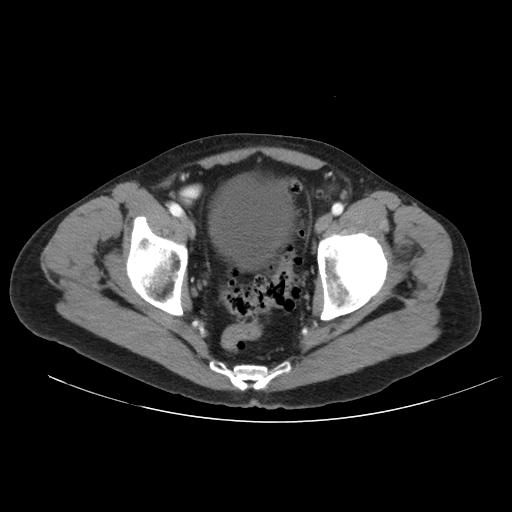
[im 32/91  soft-tissue]
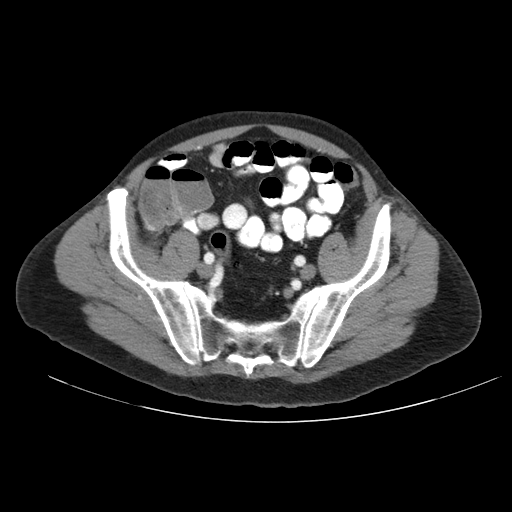
[im 43/91  soft-tissue]
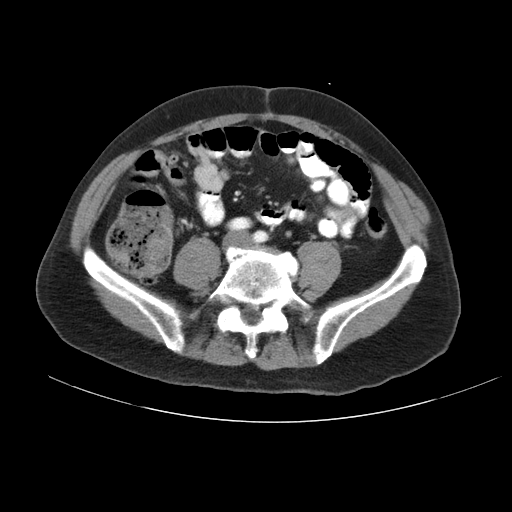
[im 53/91  soft-tissue]
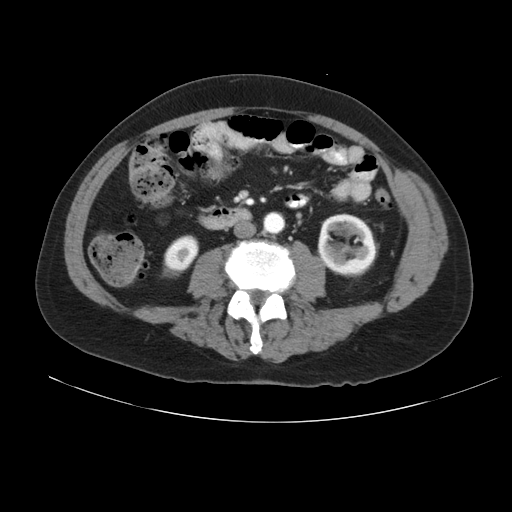
[im 64/91  soft-tissue]
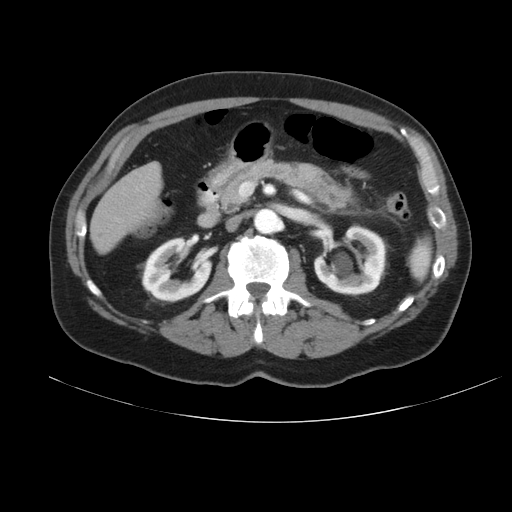
[im 75/91  soft-tissue]
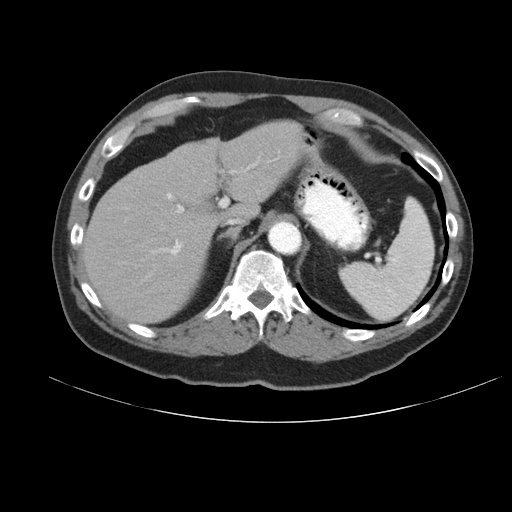
[im 85/91  soft-tissue]
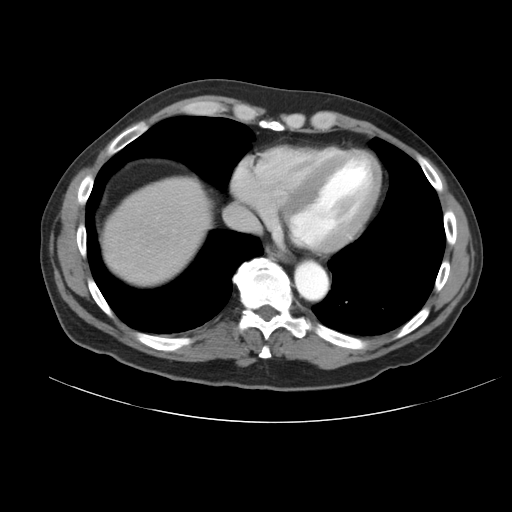

[Series 203: coronals, idose (2) · coronal · 0.45mm/px · 3 of 108 slices shown]
[im 36/108  soft-tissue]
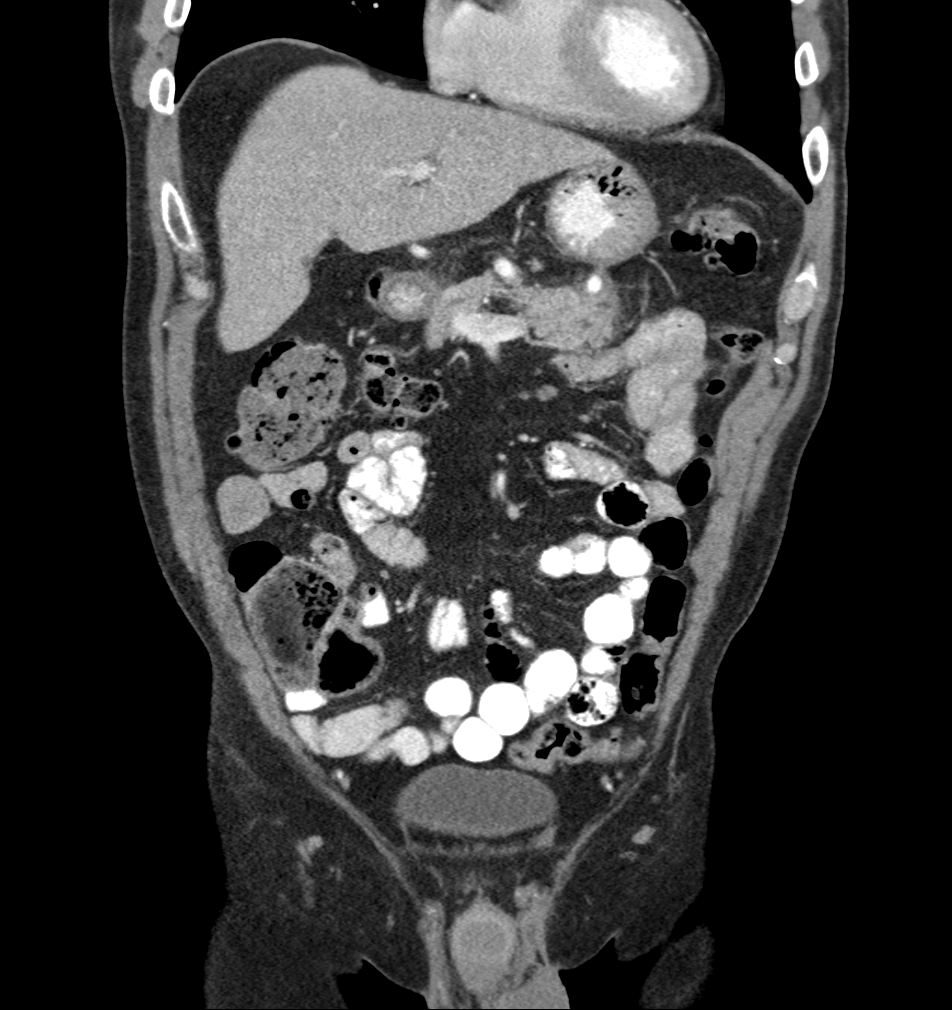
[im 48/108  soft-tissue]
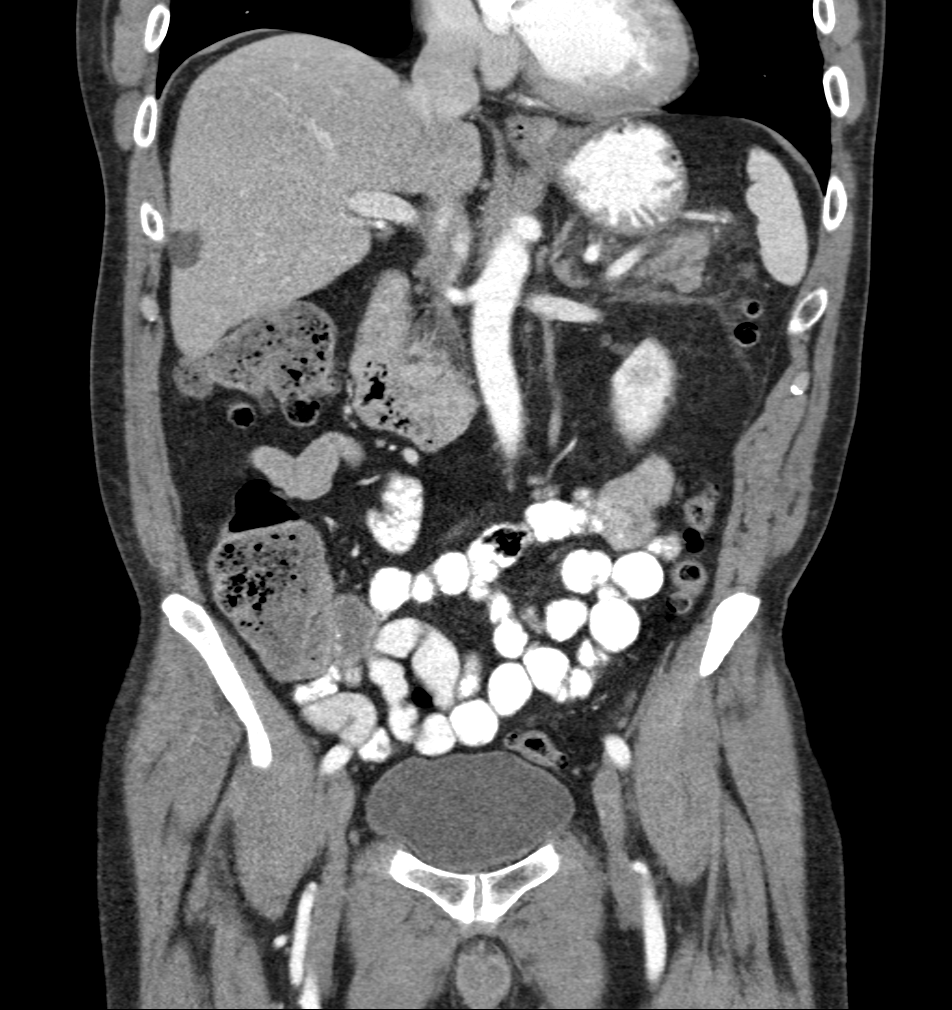
[im 60/108  soft-tissue]
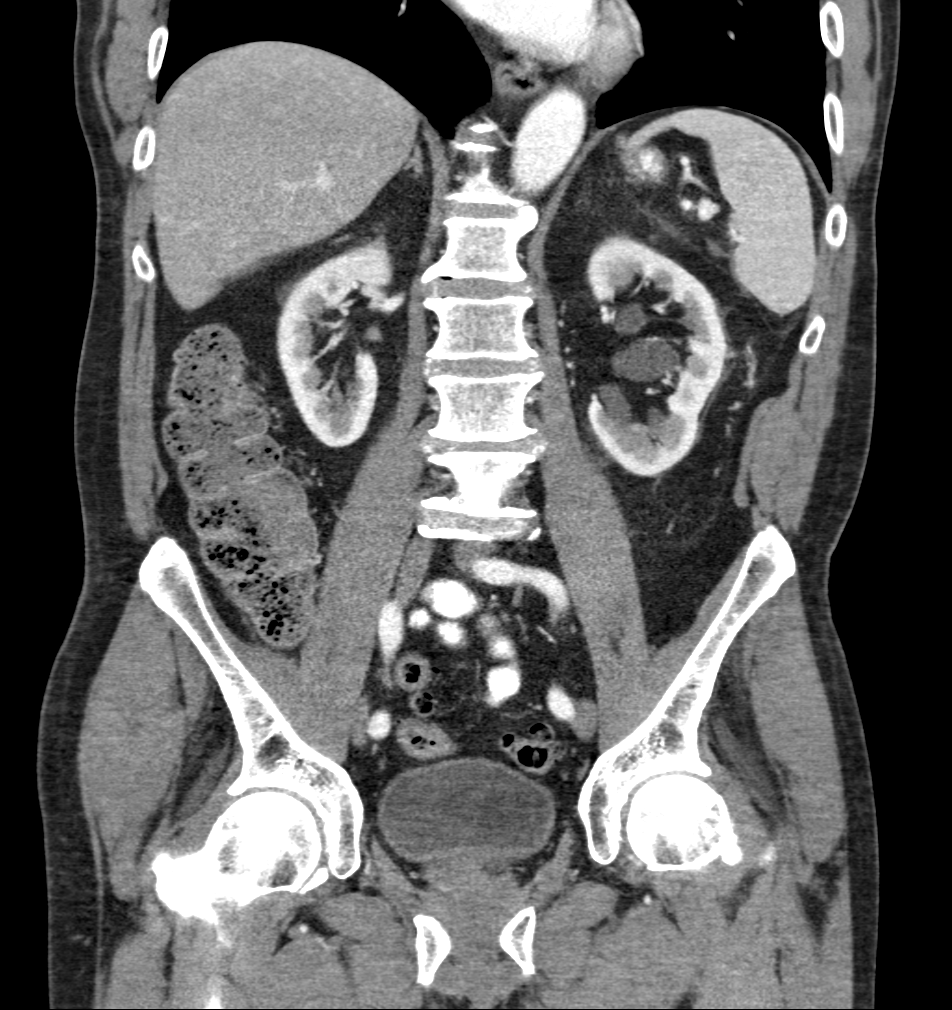

[11 of 46 positions shown; findings below may reference images not displayed]

FINDINGS: Multiple small liver cysts are again demonstrated. Also noted are
bilateral renal cysts, including bilateral parapelvic cysts, greater
on the left compared to the right. Cholecystectomy clips. The distal
pancreatic tail has ill-defined margins with peripancreatic soft
tissue stranding. The pancreatic duct in that portion of the tail is
also dilated, measuring 4.9 mm in diameter. The remainder the
pancreas has a normal appearance. No fluid collections are seen.

Unremarkable spleen, accessory splenule, adrenal glands, ureters and
urinary bladder. Mildly enlarged prostate gland. Large number of
colonic diverticula without evidence of diverticulitis. No evidence
of appendicitis. No enlarged lymph nodes. Small sliding hiatal
hernia with mild diffuse wall thickening without significant change.

Clear lung bases. Lumbar and lower thoracic spine degenerative
changes. Mild bilateral hip degenerative changes.
IMPRESSION: 1. Acute pancreatitis involving the distal tail of the pancreas.
2. Multiple liver and bilateral renal cysts.
3. Extensive colonic diverticulosis.
4. Small sliding hiatal hernia with mild diffuse chronic wall
thickening.

## 2017-06-07 ENCOUNTER — Other Ambulatory Visit: Payer: Medicare Other

## 2017-06-07 DIAGNOSIS — C259 Malignant neoplasm of pancreas, unspecified: Secondary | ICD-10-CM | POA: Diagnosis not present

## 2017-06-12 ENCOUNTER — Telehealth: Payer: Self-pay | Admitting: Pediatrics

## 2017-06-12 DIAGNOSIS — C259 Malignant neoplasm of pancreas, unspecified: Secondary | ICD-10-CM | POA: Diagnosis not present

## 2017-06-12 DIAGNOSIS — D701 Agranulocytosis secondary to cancer chemotherapy: Secondary | ICD-10-CM | POA: Diagnosis not present

## 2017-06-12 DIAGNOSIS — Z5111 Encounter for antineoplastic chemotherapy: Secondary | ICD-10-CM | POA: Diagnosis not present

## 2017-06-12 DIAGNOSIS — T451X5A Adverse effect of antineoplastic and immunosuppressive drugs, initial encounter: Secondary | ICD-10-CM | POA: Diagnosis not present

## 2017-06-13 NOTE — Telephone Encounter (Signed)
Sent / lat

## 2017-06-20 DIAGNOSIS — C259 Malignant neoplasm of pancreas, unspecified: Secondary | ICD-10-CM | POA: Diagnosis not present

## 2017-06-22 DIAGNOSIS — C259 Malignant neoplasm of pancreas, unspecified: Secondary | ICD-10-CM | POA: Diagnosis not present

## 2017-07-04 DIAGNOSIS — C259 Malignant neoplasm of pancreas, unspecified: Secondary | ICD-10-CM | POA: Diagnosis not present

## 2017-07-06 DIAGNOSIS — C259 Malignant neoplasm of pancreas, unspecified: Secondary | ICD-10-CM | POA: Diagnosis not present

## 2017-07-24 DIAGNOSIS — R590 Localized enlarged lymph nodes: Secondary | ICD-10-CM | POA: Diagnosis not present

## 2017-07-24 DIAGNOSIS — K8681 Exocrine pancreatic insufficiency: Secondary | ICD-10-CM | POA: Diagnosis not present

## 2017-07-24 DIAGNOSIS — Z79899 Other long term (current) drug therapy: Secondary | ICD-10-CM | POA: Diagnosis not present

## 2017-07-24 DIAGNOSIS — Z5111 Encounter for antineoplastic chemotherapy: Secondary | ICD-10-CM | POA: Diagnosis not present

## 2017-07-24 DIAGNOSIS — C259 Malignant neoplasm of pancreas, unspecified: Secondary | ICD-10-CM | POA: Diagnosis not present

## 2017-07-24 DIAGNOSIS — T451X5A Adverse effect of antineoplastic and immunosuppressive drugs, initial encounter: Secondary | ICD-10-CM | POA: Diagnosis not present

## 2017-07-24 DIAGNOSIS — D701 Agranulocytosis secondary to cancer chemotherapy: Secondary | ICD-10-CM | POA: Diagnosis not present

## 2017-07-26 DIAGNOSIS — C259 Malignant neoplasm of pancreas, unspecified: Secondary | ICD-10-CM | POA: Diagnosis not present

## 2017-07-26 DIAGNOSIS — T451X5A Adverse effect of antineoplastic and immunosuppressive drugs, initial encounter: Secondary | ICD-10-CM | POA: Diagnosis not present

## 2017-07-26 DIAGNOSIS — K8681 Exocrine pancreatic insufficiency: Secondary | ICD-10-CM | POA: Diagnosis not present

## 2017-07-26 DIAGNOSIS — R590 Localized enlarged lymph nodes: Secondary | ICD-10-CM | POA: Diagnosis not present

## 2017-07-26 DIAGNOSIS — D701 Agranulocytosis secondary to cancer chemotherapy: Secondary | ICD-10-CM | POA: Diagnosis not present

## 2017-07-26 DIAGNOSIS — Z5111 Encounter for antineoplastic chemotherapy: Secondary | ICD-10-CM | POA: Diagnosis not present

## 2017-08-07 DIAGNOSIS — D701 Agranulocytosis secondary to cancer chemotherapy: Secondary | ICD-10-CM | POA: Diagnosis not present

## 2017-08-07 DIAGNOSIS — C259 Malignant neoplasm of pancreas, unspecified: Secondary | ICD-10-CM | POA: Diagnosis not present

## 2017-08-07 DIAGNOSIS — T451X5A Adverse effect of antineoplastic and immunosuppressive drugs, initial encounter: Secondary | ICD-10-CM | POA: Diagnosis not present

## 2017-08-10 DIAGNOSIS — C252 Malignant neoplasm of tail of pancreas: Secondary | ICD-10-CM | POA: Diagnosis not present

## 2017-08-10 DIAGNOSIS — C259 Malignant neoplasm of pancreas, unspecified: Secondary | ICD-10-CM | POA: Diagnosis not present

## 2017-08-10 DIAGNOSIS — C251 Malignant neoplasm of body of pancreas: Secondary | ICD-10-CM | POA: Diagnosis not present

## 2017-08-29 DIAGNOSIS — C259 Malignant neoplasm of pancreas, unspecified: Secondary | ICD-10-CM | POA: Diagnosis not present

## 2017-09-13 ENCOUNTER — Encounter: Payer: Self-pay | Admitting: Pediatrics

## 2017-09-15 ENCOUNTER — Other Ambulatory Visit: Payer: Self-pay | Admitting: Pediatrics

## 2017-09-15 ENCOUNTER — Telehealth: Payer: Self-pay | Admitting: Pediatrics

## 2017-09-15 MED ORDER — AZITHROMYCIN 250 MG PO TABS: ORAL_TABLET | ORAL | 0 refills | Status: DC

## 2017-09-18 DIAGNOSIS — C252 Malignant neoplasm of tail of pancreas: Secondary | ICD-10-CM | POA: Diagnosis not present

## 2017-09-18 DIAGNOSIS — C259 Malignant neoplasm of pancreas, unspecified: Secondary | ICD-10-CM | POA: Diagnosis not present

## 2017-09-19 DIAGNOSIS — C772 Secondary and unspecified malignant neoplasm of intra-abdominal lymph nodes: Secondary | ICD-10-CM | POA: Diagnosis not present

## 2017-09-19 DIAGNOSIS — C250 Malignant neoplasm of head of pancreas: Secondary | ICD-10-CM | POA: Diagnosis not present

## 2017-09-19 DIAGNOSIS — C251 Malignant neoplasm of body of pancreas: Secondary | ICD-10-CM | POA: Diagnosis not present

## 2017-09-19 DIAGNOSIS — C259 Malignant neoplasm of pancreas, unspecified: Secondary | ICD-10-CM | POA: Diagnosis not present

## 2017-09-21 ENCOUNTER — Ambulatory Visit (INDEPENDENT_AMBULATORY_CARE_PROVIDER_SITE_OTHER): Payer: Medicare Other | Admitting: Family Medicine

## 2017-09-21 ENCOUNTER — Encounter: Payer: Self-pay | Admitting: Family Medicine

## 2017-09-21 VITALS — BP 123/78 | HR 72 | Temp 97.6°F | Ht 71.0 in | Wt 180.0 lb

## 2017-09-21 DIAGNOSIS — J0101 Acute recurrent maxillary sinusitis: Secondary | ICD-10-CM | POA: Diagnosis not present

## 2017-09-21 MED ORDER — PREDNISONE 20 MG PO TABS: ORAL_TABLET | ORAL | 0 refills | Status: DC

## 2017-09-21 MED ORDER — FLUTICASONE PROPIONATE 50 MCG/ACT NA SUSP: 1.0000 | Freq: Two times a day (BID) | NASAL | 6 refills | Status: DC | PRN

## 2017-09-21 NOTE — Progress Notes (Signed)
BP 123/78   Pulse 72   Temp 97.6 F (36.4 C) (Oral)   Ht 5\' 11"  (1.803 m)   Wt 180 lb (81.6 kg)   BMI 25.10 kg/m    Subjective:    Patient ID: Kevin Villegas, male    DOB: March 03, 1941, 76 y.o.   MRN: [id number]  HPI: Kevin Villegas is a 76 y.o. male presenting on 09/21/2017 for Sinusitis (sinus congestion; having surgery this month for pancreatic cancer; tried several OTC meds)   HPI Sinus congestion and allergies Coming in complaining of sinus congestion and pressure this been going on for a couple weeks. He is concerned because he needs to get this cleared up so he can have his surgery for pancreatic cancer later this month. He has been using Sudafed and Afrin. He denies any fevers or chills or cough or congestion  Relevant past medical, surgical, family and social history reviewed and updated as indicated. Interim medical history since our last visit reviewed. Allergies and medications reviewed and updated.  Review of Systems  Constitutional: Negative for chills and fever.  HENT: Positive for congestion, postnasal drip, rhinorrhea, sinus pressure, sneezing and sore throat. Negative for ear discharge, ear pain and voice change.   Eyes: Negative for pain, discharge, redness and visual disturbance.  Respiratory: Positive for cough. Negative for shortness of breath and wheezing.   Cardiovascular: Negative for chest pain and leg swelling.  Musculoskeletal: Negative for gait problem.  Skin: Negative for rash.  All other systems reviewed and are negative.   Per HPI unless specifically indicated above        Objective:    BP 123/78   Pulse 72   Temp 97.6 F (36.4 C) (Oral)   Ht 5\' 11"  (1.803 m)   Wt 180 lb (81.6 kg)   BMI 25.10 kg/m   Wt Readings from Last 3 Encounters:  09/21/17 180 lb (81.6 kg)  03/22/17 171 lb 6.4 oz (77.7 kg)  09/24/15 170 lb (77.1 kg)    Physical Exam  Constitutional: He is oriented to person, place, and time. He appears well-developed and  well-nourished. No distress.  HENT:  Right Ear: Tympanic membrane, external ear and ear canal normal.  Left Ear: Tympanic membrane, external ear and ear canal normal.  Nose: Mucosal edema and rhinorrhea present. No sinus tenderness. No epistaxis. Right sinus exhibits no maxillary sinus tenderness and no frontal sinus tenderness. Left sinus exhibits no maxillary sinus tenderness and no frontal sinus tenderness.  Mouth/Throat: Uvula is midline and mucous membranes are normal. Posterior oropharyngeal edema and posterior oropharyngeal erythema present. No oropharyngeal exudate or tonsillar abscesses.  Eyes: Conjunctivae are normal. No scleral icterus.  Neck: Neck supple. No thyromegaly present.  Cardiovascular: Normal rate, regular rhythm, normal heart sounds and intact distal pulses.   No murmur heard. Pulmonary/Chest: Effort normal and breath sounds normal. No respiratory distress. He has no wheezes. He has no rales.  Musculoskeletal: Normal range of motion. He exhibits no edema.  Lymphadenopathy:    He has no cervical adenopathy.  Neurological: He is alert and oriented to person, place, and time. Coordination normal.  Skin: Skin is warm and dry. No rash noted. He is not diaphoretic.  Psychiatric: He has a normal mood and affect. His behavior is normal.  Nursing note and vitals reviewed.       Assessment & Plan:   Problem List Items Addressed This Visit    None    Visit Diagnoses    Acute recurrent maxillary sinusitis    -  Primary   Likely allergic   Relevant Medications   predniSONE (DELTASONE) 20 MG tablet   fluticasone (FLONASE) 50 MCG/ACT nasal spray       Follow up plan: Return if symptoms worsen or fail to improve.  Counseling provided for all of the vaccine components No orders of the defined types were placed in this encounter.   Caryl Pina, MD [facility] 09/21/2017, 3:58 PM

## 2017-09-25 DIAGNOSIS — D701 Agranulocytosis secondary to cancer chemotherapy: Secondary | ICD-10-CM | POA: Diagnosis not present

## 2017-09-25 DIAGNOSIS — T451X5A Adverse effect of antineoplastic and immunosuppressive drugs, initial encounter: Secondary | ICD-10-CM | POA: Diagnosis not present

## 2017-09-25 DIAGNOSIS — Z23 Encounter for immunization: Secondary | ICD-10-CM | POA: Diagnosis not present

## 2017-09-25 DIAGNOSIS — C252 Malignant neoplasm of tail of pancreas: Secondary | ICD-10-CM | POA: Diagnosis not present

## 2017-09-26 DIAGNOSIS — Z23 Encounter for immunization: Secondary | ICD-10-CM | POA: Diagnosis not present

## 2017-09-27 ENCOUNTER — Telehealth: Payer: Self-pay | Admitting: Pediatrics

## 2017-09-27 MED ORDER — CEFDINIR 300 MG PO CAPS: 300.0000 mg | ORAL_CAPSULE | Freq: Two times a day (BID) | ORAL | 0 refills | Status: DC

## 2017-09-27 NOTE — Telephone Encounter (Signed)
Pt aware.

## 2017-09-27 NOTE — Telephone Encounter (Signed)
Was seen 10/04 and given nasal spray and prednisone and had some relief. He does not think the nasal spray helps wants something else called in. [facility] in [place].

## 2017-09-29 DIAGNOSIS — C252 Malignant neoplasm of tail of pancreas: Secondary | ICD-10-CM | POA: Diagnosis not present

## 2017-09-29 DIAGNOSIS — T451X5A Adverse effect of antineoplastic and immunosuppressive drugs, initial encounter: Secondary | ICD-10-CM | POA: Diagnosis not present

## 2017-09-29 DIAGNOSIS — D701 Agranulocytosis secondary to cancer chemotherapy: Secondary | ICD-10-CM | POA: Diagnosis not present

## 2017-09-29 DIAGNOSIS — Z23 Encounter for immunization: Secondary | ICD-10-CM | POA: Diagnosis not present

## 2017-10-18 DIAGNOSIS — C252 Malignant neoplasm of tail of pancreas: Secondary | ICD-10-CM | POA: Diagnosis not present

## 2017-10-19 DIAGNOSIS — Z9049 Acquired absence of other specified parts of digestive tract: Secondary | ICD-10-CM | POA: Diagnosis not present

## 2017-10-19 DIAGNOSIS — C786 Secondary malignant neoplasm of retroperitoneum and peritoneum: Secondary | ICD-10-CM | POA: Diagnosis present

## 2017-10-19 DIAGNOSIS — C7889 Secondary malignant neoplasm of other digestive organs: Secondary | ICD-10-CM | POA: Diagnosis present

## 2017-10-19 DIAGNOSIS — C252 Malignant neoplasm of tail of pancreas: Secondary | ICD-10-CM | POA: Diagnosis present

## 2017-10-19 DIAGNOSIS — Z9221 Personal history of antineoplastic chemotherapy: Secondary | ICD-10-CM | POA: Diagnosis not present

## 2017-10-19 DIAGNOSIS — C168 Malignant neoplasm of overlapping sites of stomach: Secondary | ICD-10-CM | POA: Diagnosis not present

## 2017-10-19 DIAGNOSIS — C772 Secondary and unspecified malignant neoplasm of intra-abdominal lymph nodes: Secondary | ICD-10-CM | POA: Diagnosis not present

## 2017-10-19 HISTORY — PX: LAPAROSCOPIC DISTAL PANCREATECTOMY: SHX1922

## 2017-10-19 HISTORY — PX: LAPAROSCOPIC PARTIAL GASTRECTOMY: SHX1933

## 2017-10-19 HISTORY — PX: LAPAROSCOPIC SPLENECTOMY: SUR796

## 2017-10-23 MED ORDER — LIDOCAINE-PRILOCAINE 2.5-2.5 % EX CREA
TOPICAL_CREAM | CUTANEOUS | Status: DC
Start: ? — End: 2017-10-23

## 2017-10-23 MED ORDER — SODIUM CHLORIDE 0.65 % NA SOLN
1.00 | NASAL | Status: DC
Start: ? — End: 2017-10-23

## 2017-10-23 MED ORDER — ACETAMINOPHEN 325 MG PO TABS
650.00 | ORAL_TABLET | ORAL | Status: DC
Start: 2017-10-23 — End: 2017-10-23

## 2017-10-23 MED ORDER — LIDOCAINE HCL 1 % IJ SOLN
0.50 | INTRAMUSCULAR | Status: DC
Start: ? — End: 2017-10-23

## 2017-10-23 MED ORDER — GABAPENTIN 100 MG PO CAPS
100.00 | ORAL_CAPSULE | ORAL | Status: DC
Start: 2017-10-23 — End: 2017-10-23

## 2017-10-23 MED ORDER — MAGNESIUM HYDROXIDE 400 MG/5ML PO SUSP
30.00 | ORAL | Status: DC
Start: 2017-10-24 — End: 2017-10-23

## 2017-10-23 MED ORDER — HYDRALAZINE HCL 20 MG/ML IJ SOLN
10.00 | INTRAMUSCULAR | Status: DC
Start: ? — End: 2017-10-23

## 2017-10-23 MED ORDER — CELECOXIB 100 MG PO CAPS
100.00 | ORAL_CAPSULE | ORAL | Status: DC
Start: 2017-10-23 — End: 2017-10-23

## 2017-10-23 MED ORDER — FLUTICASONE PROPIONATE 50 MCG/ACT NA SUSP
1.00 | NASAL | Status: DC
Start: 2017-10-24 — End: 2017-10-23

## 2017-10-23 MED ORDER — ONDANSETRON HCL 4 MG/2ML IJ SOLN
4.00 | INTRAMUSCULAR | Status: DC
Start: ? — End: 2017-10-23

## 2017-10-23 MED ORDER — TRAMADOL HCL 50 MG PO TABS
25.00 | ORAL_TABLET | ORAL | Status: DC
Start: ? — End: 2017-10-23

## 2017-10-23 MED ORDER — ENOXAPARIN SODIUM 40 MG/0.4ML ~~LOC~~ SOLN
40.00 | SUBCUTANEOUS | Status: DC
Start: 2017-10-24 — End: 2017-10-23

## 2017-10-23 MED ORDER — SENNOSIDES-DOCUSATE SODIUM 8.6-50 MG PO TABS
2.00 | ORAL_TABLET | ORAL | Status: DC
Start: 2017-10-23 — End: 2017-10-23

## 2017-10-23 MED ORDER — PANTOPRAZOLE SODIUM 20 MG PO TBEC
20.00 | DELAYED_RELEASE_TABLET | ORAL | Status: DC
Start: 2017-10-24 — End: 2017-10-23

## 2017-10-23 MED ORDER — OXYCODONE HCL 5 MG PO TABS
5.00 | ORAL_TABLET | ORAL | Status: DC
Start: ? — End: 2017-10-23

## 2017-10-23 MED ORDER — GENERIC EXTERNAL MEDICATION
12.50 | Status: DC
Start: ? — End: 2017-10-23

## 2017-10-31 DIAGNOSIS — C259 Malignant neoplasm of pancreas, unspecified: Secondary | ICD-10-CM | POA: Diagnosis not present

## 2017-10-31 DIAGNOSIS — C258 Malignant neoplasm of overlapping sites of pancreas: Secondary | ICD-10-CM | POA: Diagnosis not present

## 2017-11-21 DIAGNOSIS — C252 Malignant neoplasm of tail of pancreas: Secondary | ICD-10-CM | POA: Diagnosis not present

## 2017-12-01 DIAGNOSIS — C772 Secondary and unspecified malignant neoplasm of intra-abdominal lymph nodes: Secondary | ICD-10-CM | POA: Diagnosis not present

## 2017-12-01 DIAGNOSIS — Z90410 Acquired total absence of pancreas: Secondary | ICD-10-CM | POA: Diagnosis not present

## 2017-12-01 DIAGNOSIS — C7889 Secondary malignant neoplasm of other digestive organs: Secondary | ICD-10-CM | POA: Diagnosis not present

## 2017-12-01 DIAGNOSIS — D733 Abscess of spleen: Secondary | ICD-10-CM | POA: Diagnosis not present

## 2017-12-01 DIAGNOSIS — Z903 Acquired absence of stomach [part of]: Secondary | ICD-10-CM | POA: Diagnosis not present

## 2017-12-01 DIAGNOSIS — C259 Malignant neoplasm of pancreas, unspecified: Secondary | ICD-10-CM | POA: Diagnosis not present

## 2017-12-01 DIAGNOSIS — Z23 Encounter for immunization: Secondary | ICD-10-CM | POA: Diagnosis not present

## 2017-12-20 DIAGNOSIS — D701 Agranulocytosis secondary to cancer chemotherapy: Secondary | ICD-10-CM | POA: Diagnosis not present

## 2017-12-20 DIAGNOSIS — C259 Malignant neoplasm of pancreas, unspecified: Secondary | ICD-10-CM | POA: Diagnosis not present

## 2017-12-20 DIAGNOSIS — Z5111 Encounter for antineoplastic chemotherapy: Secondary | ICD-10-CM | POA: Diagnosis not present

## 2017-12-20 DIAGNOSIS — T451X5A Adverse effect of antineoplastic and immunosuppressive drugs, initial encounter: Secondary | ICD-10-CM | POA: Diagnosis not present

## 2017-12-22 DIAGNOSIS — D701 Agranulocytosis secondary to cancer chemotherapy: Secondary | ICD-10-CM | POA: Diagnosis not present

## 2017-12-22 DIAGNOSIS — C259 Malignant neoplasm of pancreas, unspecified: Secondary | ICD-10-CM | POA: Diagnosis not present

## 2017-12-22 DIAGNOSIS — T451X5A Adverse effect of antineoplastic and immunosuppressive drugs, initial encounter: Secondary | ICD-10-CM | POA: Diagnosis not present

## 2017-12-22 DIAGNOSIS — Z5111 Encounter for antineoplastic chemotherapy: Secondary | ICD-10-CM | POA: Diagnosis not present

## 2018-01-09 DIAGNOSIS — T451X5A Adverse effect of antineoplastic and immunosuppressive drugs, initial encounter: Secondary | ICD-10-CM | POA: Diagnosis not present

## 2018-01-09 DIAGNOSIS — Z5111 Encounter for antineoplastic chemotherapy: Secondary | ICD-10-CM | POA: Diagnosis not present

## 2018-01-09 DIAGNOSIS — D701 Agranulocytosis secondary to cancer chemotherapy: Secondary | ICD-10-CM | POA: Diagnosis not present

## 2018-01-09 DIAGNOSIS — C259 Malignant neoplasm of pancreas, unspecified: Secondary | ICD-10-CM | POA: Diagnosis not present

## 2018-01-22 DIAGNOSIS — D701 Agranulocytosis secondary to cancer chemotherapy: Secondary | ICD-10-CM | POA: Diagnosis not present

## 2018-01-22 DIAGNOSIS — T451X5A Adverse effect of antineoplastic and immunosuppressive drugs, initial encounter: Secondary | ICD-10-CM | POA: Diagnosis not present

## 2018-01-22 DIAGNOSIS — C259 Malignant neoplasm of pancreas, unspecified: Secondary | ICD-10-CM | POA: Diagnosis not present

## 2018-01-22 DIAGNOSIS — Z5111 Encounter for antineoplastic chemotherapy: Secondary | ICD-10-CM | POA: Diagnosis not present

## 2018-01-24 DIAGNOSIS — D701 Agranulocytosis secondary to cancer chemotherapy: Secondary | ICD-10-CM | POA: Diagnosis not present

## 2018-01-24 DIAGNOSIS — Z5111 Encounter for antineoplastic chemotherapy: Secondary | ICD-10-CM | POA: Diagnosis not present

## 2018-01-24 DIAGNOSIS — T451X5A Adverse effect of antineoplastic and immunosuppressive drugs, initial encounter: Secondary | ICD-10-CM | POA: Diagnosis not present

## 2018-01-24 DIAGNOSIS — C259 Malignant neoplasm of pancreas, unspecified: Secondary | ICD-10-CM | POA: Diagnosis not present

## 2018-02-12 DIAGNOSIS — D701 Agranulocytosis secondary to cancer chemotherapy: Secondary | ICD-10-CM | POA: Diagnosis not present

## 2018-02-12 DIAGNOSIS — C259 Malignant neoplasm of pancreas, unspecified: Secondary | ICD-10-CM | POA: Diagnosis not present

## 2018-02-12 DIAGNOSIS — T451X5A Adverse effect of antineoplastic and immunosuppressive drugs, initial encounter: Secondary | ICD-10-CM | POA: Diagnosis not present

## 2018-02-12 DIAGNOSIS — Z5111 Encounter for antineoplastic chemotherapy: Secondary | ICD-10-CM | POA: Diagnosis not present

## 2018-02-20 DIAGNOSIS — Z5111 Encounter for antineoplastic chemotherapy: Secondary | ICD-10-CM | POA: Diagnosis not present

## 2018-02-20 DIAGNOSIS — C259 Malignant neoplasm of pancreas, unspecified: Secondary | ICD-10-CM | POA: Diagnosis not present

## 2018-02-27 DIAGNOSIS — Z5111 Encounter for antineoplastic chemotherapy: Secondary | ICD-10-CM | POA: Diagnosis not present

## 2018-02-27 DIAGNOSIS — C259 Malignant neoplasm of pancreas, unspecified: Secondary | ICD-10-CM | POA: Diagnosis not present

## 2018-03-06 DIAGNOSIS — Z5111 Encounter for antineoplastic chemotherapy: Secondary | ICD-10-CM | POA: Diagnosis not present

## 2018-03-06 DIAGNOSIS — C259 Malignant neoplasm of pancreas, unspecified: Secondary | ICD-10-CM | POA: Diagnosis not present

## 2018-03-20 DIAGNOSIS — Z5111 Encounter for antineoplastic chemotherapy: Secondary | ICD-10-CM | POA: Diagnosis not present

## 2018-03-20 DIAGNOSIS — C259 Malignant neoplasm of pancreas, unspecified: Secondary | ICD-10-CM | POA: Diagnosis not present

## 2018-05-15 DIAGNOSIS — C772 Secondary and unspecified malignant neoplasm of intra-abdominal lymph nodes: Secondary | ICD-10-CM | POA: Diagnosis not present

## 2018-05-15 DIAGNOSIS — Z9081 Acquired absence of spleen: Secondary | ICD-10-CM | POA: Diagnosis not present

## 2018-05-15 DIAGNOSIS — C252 Malignant neoplasm of tail of pancreas: Secondary | ICD-10-CM | POA: Diagnosis not present

## 2018-05-15 DIAGNOSIS — Z90411 Acquired partial absence of pancreas: Secondary | ICD-10-CM | POA: Diagnosis not present

## 2018-05-15 DIAGNOSIS — C259 Malignant neoplasm of pancreas, unspecified: Secondary | ICD-10-CM | POA: Diagnosis not present

## 2018-07-24 DIAGNOSIS — C259 Malignant neoplasm of pancreas, unspecified: Secondary | ICD-10-CM | POA: Diagnosis not present

## 2018-09-24 DIAGNOSIS — Z23 Encounter for immunization: Secondary | ICD-10-CM | POA: Diagnosis not present

## 2018-11-06 DIAGNOSIS — C259 Malignant neoplasm of pancreas, unspecified: Secondary | ICD-10-CM | POA: Diagnosis not present

## 2018-11-06 DIAGNOSIS — Z903 Acquired absence of stomach [part of]: Secondary | ICD-10-CM | POA: Diagnosis not present

## 2018-11-06 DIAGNOSIS — Z9081 Acquired absence of spleen: Secondary | ICD-10-CM | POA: Diagnosis not present

## 2018-11-06 DIAGNOSIS — K5732 Diverticulitis of large intestine without perforation or abscess without bleeding: Secondary | ICD-10-CM | POA: Diagnosis not present

## 2018-11-06 DIAGNOSIS — C252 Malignant neoplasm of tail of pancreas: Secondary | ICD-10-CM | POA: Diagnosis not present

## 2018-11-06 DIAGNOSIS — Z23 Encounter for immunization: Secondary | ICD-10-CM | POA: Diagnosis not present

## 2018-11-06 DIAGNOSIS — R918 Other nonspecific abnormal finding of lung field: Secondary | ICD-10-CM | POA: Diagnosis not present

## 2018-11-06 DIAGNOSIS — Z9049 Acquired absence of other specified parts of digestive tract: Secondary | ICD-10-CM | POA: Diagnosis not present

## 2018-11-06 DIAGNOSIS — K5792 Diverticulitis of intestine, part unspecified, without perforation or abscess without bleeding: Secondary | ICD-10-CM | POA: Diagnosis not present

## 2018-11-06 DIAGNOSIS — Z90411 Acquired partial absence of pancreas: Secondary | ICD-10-CM | POA: Diagnosis not present

## 2018-11-13 DIAGNOSIS — Z95828 Presence of other vascular implants and grafts: Secondary | ICD-10-CM | POA: Diagnosis not present

## 2018-11-13 DIAGNOSIS — C252 Malignant neoplasm of tail of pancreas: Secondary | ICD-10-CM | POA: Diagnosis not present

## 2019-01-01 DIAGNOSIS — C252 Malignant neoplasm of tail of pancreas: Secondary | ICD-10-CM | POA: Diagnosis not present

## 2019-02-05 DIAGNOSIS — C252 Malignant neoplasm of tail of pancreas: Secondary | ICD-10-CM | POA: Diagnosis not present

## 2019-02-05 DIAGNOSIS — R918 Other nonspecific abnormal finding of lung field: Secondary | ICD-10-CM | POA: Diagnosis not present

## 2019-02-26 ENCOUNTER — Encounter: Payer: Self-pay | Admitting: Family Medicine

## 2019-02-26 ENCOUNTER — Ambulatory Visit (INDEPENDENT_AMBULATORY_CARE_PROVIDER_SITE_OTHER): Payer: Medicare Other | Admitting: Family Medicine

## 2019-02-26 VITALS — BP 139/87 | HR 77 | Temp 97.9°F | Ht 71.0 in | Wt 186.0 lb

## 2019-02-26 DIAGNOSIS — J310 Chronic rhinitis: Secondary | ICD-10-CM

## 2019-02-26 DIAGNOSIS — T485X5A Adverse effect of other anti-common-cold drugs, initial encounter: Secondary | ICD-10-CM | POA: Diagnosis not present

## 2019-02-26 MED ORDER — PREDNISONE 20 MG PO TABS: ORAL_TABLET | ORAL | 0 refills | Status: DC

## 2019-02-26 NOTE — Patient Instructions (Signed)
Nonallergic Rhinitis Nonallergic rhinitis is a condition that causes symptoms that affect the nose, such as a runny nose and a stuffed-up nose (nasal congestion) that can make it hard to breathe through the nose. This condition is different from having an allergy (allergic rhinitis). Allergic rhinitis occurs when the body's defense system (immune system) reacts to a substance that you are allergic to (allergen), such as pollen, pet dander, mold, or dust. Nonallergic rhinitis has many similar symptoms, but it is not caused by allergens. Nonallergic rhinitis can be a short-term or long-term problem. What are the causes? This condition can be caused by many different things. Some common types of nonallergic rhinitis include: Infectious rhinitis  This is usually due to an infection in the upper respiratory tract. Vasomotor rhinitis  This is the most common type of long-term nonallergic rhinitis.  It is caused by too much blood flow through the nose, which makes the tissue inside of the nose swell.  Symptoms are often triggered by strong odors, cold air, stress, drinking alcohol, cigarette smoke, or changes in the weather. Occupational rhinitis  This type is caused by triggers in the workplace, such as chemicals, dusts, animal dander, or air pollution. Hormonal rhinitis  This type occurs in women as a result of an increase in the male hormone estrogen.  It may occur during pregnancy, puberty, and menstrual cycles.  Symptoms improve when estrogen levels drop. Drug-induced rhinitis Several drugs can cause nonallergic rhinitis, including:  Medicines that are used to treat high blood pressure, heart disease, and Parkinson disease.  Aspirin and NSAIDs.  Over-the-counter nasal decongestant sprays. These can cause a type of nonallergic rhinitis (rhinitis medicamentosa) when they are used for more than a few days. Nonallergic rhinitis with eosinophilia syndrome (NARES)  This type is caused by  having too much of a certain type of white blood cell (eosinophil). Nonallergic rhinitis can also be caused by a reaction to eating hot or spicy foods. This does not usually cause long-term symptoms. In some cases, the cause of nonallergic rhinitis is not known. What increases the risk? You are more likely to develop this condition if:  You are 76-19 years of age.  You are a woman. Women are twice as likely to have this condition. What are the signs or symptoms? Common symptoms of this condition include:  Nasal congestion.  Runny nose.  The feeling of mucus going down the back of the throat (postnasal drip).  Trouble sleeping at night and daytime sleepiness. Less common symptoms include:  Sneezing.  Coughing.  Itchy nose.  Bloodshot eyes. How is this diagnosed? This condition may be diagnosed based on:  Your symptoms and medical history.  A physical exam.  Allergy testing to rule out allergic rhinitis. You may have skin tests or blood tests. In some cases, the health care provider may take a swab of nasal secretions to look for an increased number of eosinophils. This would be done to confirm a diagnosis of NARES. How is this treated? Treatment for this condition depends on the cause. No single treatment works for everyone. Work with your health care provider to find the best treatment for you. Treatment may include:  Avoiding the things that trigger your symptoms.  Using medicines to relieve congestion, such as: ? Steroid nasal spray. There are many types. You may need to try a few to find out which one works best. ? Decongestant medicine. This may be an oral medicine or a nasal spray. These medicines are only used for  a short time.  Using medicines to relieve a runny nose. These may include antihistamine medicines or anticholinergic nasal sprays.  Surgery to remove tissue from inside the nose may be needed in severe cases if the condition has not improved after 6-12  months of medical treatment. Follow these instructions at home:  Take or use over-the-counter and prescription medicines only as told by your health care provider. Do not stop using your medicine even if you start to feel better.  Use salt-water (saline) rinses or other solutions (nasal washes or irrigations) to wash or rinse out the inside of your nose as told by your health care provider.  Do not take NSAIDs or medicines that contain aspirin if they make your symptoms worse.  Do not drink alcohol if it makes your symptoms worse.  Do not use any tobacco products, such as cigarettes, chewing tobacco, and e-cigarettes. If you need help quitting, ask your health care provider.  Avoid secondhand smoke.  Get some exercise every day. Exercise may help reduce symptoms of nonallergic rhinitis for some people. Ask your health care provider how much exercise and what types of exercise are safe for you.  Sleep with the head of your bed raised (elevated). This may reduce nighttime nasal congestion.  Keep all follow-up visits as told by your health care provider. This is important. Contact a health care provider if:  You have a fever.  Your symptoms are getting worse at home.  Your symptoms are not responding to medicine.  You develop new symptoms, especially a headache or nosebleed. This information is not intended to replace advice given to you by your health care provider. Make sure you discuss any questions you have with your health care provider. Document Released: 03/28/2016 Document Revised: 05/12/2016 Document Reviewed: 02/25/2016 Elsevier Interactive Patient Education  2019 [employer].

## 2019-02-26 NOTE — Progress Notes (Signed)
Subjective:  Patient ID: Kevin Villegas, male    DOB: 08-Aug-1941, 78 y.o.   MRN: [id number]  Chief Complaint:  Nasal Congestion (taking Sudafed and nasal spray)   HPI: Kevin Villegas is a 78 y.o. male presenting on 02/26/2019 for Nasal Congestion (taking Sudafed and nasal spray)   1. Rhinitis medicamentosa   Pt presents today with complaints of nasal congestion. States this has been ongoing for several days. He states he has been using Afrin for at least 7 days and sudafed daily. He denies fever, chills, cough, sinus pressure, shortness of breath, or rhinorrhea.    Relevant past medical, surgical, family, and social history reviewed and updated as indicated.  Allergies and medications reviewed and updated.   Past Medical History:  Diagnosis Date  . Medical history non-contributory   . Pancreatic cancer [facility])     Past Surgical History:  Procedure Laterality Date  . CHOLECYSTECTOMY N/A 12/16/2014   Procedure: LAPAROSCOPIC CHOLECYSTECTOMY WITH INTRAOPERATIVE CHOLANGIOGRAM;  Surgeon: Georganna Skeans, MD;  Location: [facility];  Service: General;  Laterality: N/A;  . EUS N/A 09/24/2015   Procedure: UPPER ENDOSCOPIC ULTRASOUND (EUS) LINEAR;  Surgeon: Milus Banister, MD;  Location: [facility];  Service: Endoscopy;  Laterality: N/A;  . VASECTOMY      Social History   Socioeconomic History  . Marital status: Married    Spouse name: Not on file  . Number of children: 2  . Years of education: Not on file  . Highest education level: Not on file  Occupational History  . Occupation: retired  Scientific laboratory technician  . Financial resource strain: Not on file  . Food insecurity:    Worry: Not on file    Inability: Not on file  . Transportation needs:    Medical: Not on file    Non-medical: Not on file  Tobacco Use  . Smoking status: Never Smoker  . Smokeless tobacco: Never Used  Substance and Sexual Activity  . Alcohol use: No    Alcohol/week: 0.0 standard drinks  . Drug use: No  .  Sexual activity: Yes  Lifestyle  . Physical activity:    Days per week: Not on file    Minutes per session: Not on file  . Stress: Not on file  Relationships  . Social connections:    Talks on phone: Not on file    Gets together: Not on file    Attends religious service: Not on file    Active member of club or organization: Not on file    Attends meetings of clubs or organizations: Not on file    Relationship status: Not on file  . Intimate partner violence:    Fear of current or ex partner: Not on file    Emotionally abused: Not on file    Physically abused: Not on file    Forced sexual activity: Not on file  Other Topics Concern  . Not on file  Social History Narrative  . Not on file    Outpatient Encounter Medications as of 02/26/2019  Medication Sig  . predniSONE (DELTASONE) 20 MG tablet 2 po at sametime daily for 5 days- start tomorrow  . [DISCONTINUED] cefdinir (OMNICEF) 300 MG capsule Take 1 capsule (300 mg total) by mouth 2 (two) times daily. 1 po BID  . [DISCONTINUED] fluticasone (FLONASE) 50 MCG/ACT nasal spray Place 1 spray into both nostrils 2 (two) times daily as needed for allergies or rhinitis.  . [DISCONTINUED] predniSONE (DELTASONE) 20 MG tablet 2 po  at same time daily for 5 days   No facility-administered encounter medications on file as of 02/26/2019.     Allergies  Allergen Reactions  . Pollen Extract Other (See Comments)    Sinus congestion    Review of Systems  Constitutional: Negative for chills, fatigue and fever.  HENT: Positive for congestion. Negative for nosebleeds, postnasal drip, rhinorrhea, sinus pressure, sinus pain, sneezing and sore throat.   Respiratory: Negative for cough and shortness of breath.   Cardiovascular: Negative for chest pain, palpitations and leg swelling.  Neurological: Negative for dizziness, weakness and headaches.  Psychiatric/Behavioral: Negative for confusion.  All other systems reviewed and are negative.        Objective:  BP 139/87   Pulse 77   Temp 97.9 F (36.6 C) (Oral)   Ht 5\' 11"  (1.803 m)   Wt 186 lb (84.4 kg)   BMI 25.94 kg/m    Wt Readings from Last 3 Encounters:  02/26/19 186 lb (84.4 kg)  09/21/17 180 lb (81.6 kg)  03/22/17 171 lb 6.4 oz (77.7 kg)    Physical Exam Vitals signs and nursing note reviewed.  Constitutional:      General: He is not in acute distress.    Appearance: Normal appearance. He is not ill-appearing or toxic-appearing.  HENT:     Head: Normocephalic and atraumatic.     Jaw: There is normal jaw occlusion.     Right Ear: Hearing, tympanic membrane, ear canal and external ear normal.     Left Ear: Hearing, tympanic membrane, ear canal and external ear normal.     Nose: Mucosal edema and congestion present. No nasal deformity, septal deviation, signs of injury, laceration, nasal tenderness or rhinorrhea.     Right Turbinates: Swollen.     Left Turbinates: Swollen.     Right Sinus: No maxillary sinus tenderness or frontal sinus tenderness.     Left Sinus: No maxillary sinus tenderness or frontal sinus tenderness.     Mouth/Throat:     Lips: Pink.     Mouth: Mucous membranes are moist.     Pharynx: Oropharynx is clear. Uvula midline.  Eyes:     Extraocular Movements: Extraocular movements intact.     Conjunctiva/sclera: Conjunctivae normal.     Pupils: Pupils are equal, round, and reactive to light.  Neck:     Musculoskeletal: Normal range of motion and neck supple.  Cardiovascular:     Rate and Rhythm: Normal rate and regular rhythm.     Heart sounds: Normal heart sounds. No murmur. No friction rub. No gallop.   Pulmonary:     Effort: Pulmonary effort is normal. No respiratory distress.     Breath sounds: Normal breath sounds. No wheezing, rhonchi or rales.  Lymphadenopathy:     Cervical: No cervical adenopathy.  Skin:    General: Skin is warm and dry.     Capillary Refill: Capillary refill takes less than 2 seconds.  Neurological:      General: No focal deficit present.     Mental Status: He is alert and oriented to person, place, and time.  Psychiatric:        Mood and Affect: Mood normal.        Behavior: Behavior normal. Behavior is cooperative.        Thought Content: Thought content normal.        Judgment: Judgment normal.     Results for orders placed or performed in visit on 03/23/17  Fecal occult blood, imunochemical  Result Value Ref Range   Fecal Occult Bld Negative Negative       Pertinent labs & imaging results that were available during my care of the patient were reviewed by me and considered in my medical decision making.  Assessment & Plan:  Sultan was seen today for nasal congestion.  Diagnoses and all orders for this visit:  Rhinitis medicamentosa Avoid use of Afrin. Can use frequent saline nasal sprays. Trial Flonase. If Afrin is used, do not use for more than 3 days. Due to severity of congestion will treat with prednisone.  -     predniSONE (DELTASONE) 20 MG tablet; 2 po at sametime daily for 5 days- start tomorrow     Continue all other maintenance medications.  Follow up plan: Return if symptoms worsen or fail to improve.  Educational handout given for nonallergic rhinitis   The above assessment and management plan was discussed with the patient. The patient verbalized understanding of and has agreed to the management plan. Patient is aware to call the clinic if symptoms persist or worsen. Patient is aware when to return to the clinic for a follow-up visit. Patient educated on when it is appropriate to go to the emergency department.   Monia Pouch, FNP-C [facility] [phone]

## 2019-03-04 ENCOUNTER — Other Ambulatory Visit: Payer: Self-pay

## 2019-03-04 ENCOUNTER — Telehealth: Payer: Self-pay | Admitting: Family Medicine

## 2019-03-04 ENCOUNTER — Ambulatory Visit (INDEPENDENT_AMBULATORY_CARE_PROVIDER_SITE_OTHER): Payer: Medicare Other | Admitting: Family Medicine

## 2019-03-04 VITALS — BP 169/94 | HR 57 | Ht 71.0 in | Wt 189.0 lb

## 2019-03-04 DIAGNOSIS — T485X5A Adverse effect of other anti-common-cold drugs, initial encounter: Secondary | ICD-10-CM

## 2019-03-04 DIAGNOSIS — J310 Chronic rhinitis: Secondary | ICD-10-CM

## 2019-03-04 MED ORDER — PREDNISONE 10 MG (21) PO TBPK: ORAL_TABLET | ORAL | 0 refills | Status: DC

## 2019-03-04 NOTE — Telephone Encounter (Signed)
Pt finished predniSONE (DELTASONE) 20 MG tablet yesterday and still having symptoms (nasal congestion, not blowing out anything), he states that it is getting a little better, but still long ways from being gone, wants to know if another round of predniSONE (DELTASONE) 20 MG tablet could be sent in  [place]

## 2019-03-04 NOTE — Telephone Encounter (Signed)
Please contact the patient. Have him use OTC allegra D and flonase spray. Frequently ( every 2-4 hours) he should use a saline nasal spray ( Just not right before or after the flonase).

## 2019-03-04 NOTE — Telephone Encounter (Signed)
skype sent to [facility] to check on this

## 2019-03-04 NOTE — Progress Notes (Signed)
Subjective: CC: nasal congestion PCP: Dettinger, [name], MD Kevin Villegas is a 78 y.o. male presenting to clinic today for:  1. Nasal congestion Patient was seen 1 week ago for nasal congestion thought to be secondary to medication use.  He was given a burst of steroids x5 days.  He notes he has been not using any more Sudafed or Afrin but had been using this for over 7 days prior to his visit on the 10th.  He has been using [place] nasal spray.  He did not start any Flonase because "it has not worked for him in the past".  He notes that previously he responded to a long course of steroids and is asking for this today.  ROS: Per HPI  Allergies  Allergen Reactions  . Pollen Extract Other (See Comments)    Sinus congestion   Past Medical History:  Diagnosis Date  . Medical history non-contributory   . Pancreatic cancer ([facility])     Current Outpatient Medications:  .  predniSONE (DELTASONE) 20 MG tablet, 2 po at sametime daily for 5 days- start tomorrow, Disp: 10 tablet, Rfl: 0 Social History   Socioeconomic History  . Marital status: Married    Spouse name: Not on file  . Number of children: 2  . Years of education: Not on file  . Highest education level: Not on file  Occupational History  . Occupation: retired  Scientific laboratory technician  . Financial resource strain: Not on file  . Food insecurity:    Worry: Not on file    Inability: Not on file  . Transportation needs:    Medical: Not on file    Non-medical: Not on file  Tobacco Use  . Smoking status: Never Smoker  . Smokeless tobacco: Never Used  Substance and Sexual Activity  . Alcohol use: No    Alcohol/week: 0.0 standard drinks  . Drug use: No  . Sexual activity: Yes  Lifestyle  . Physical activity:    Days per week: Not on file    Minutes per session: Not on file  . Stress: Not on file  Relationships  . Social connections:    Talks on phone: Not on file    Gets together: Not on file    Attends religious service:  Not on file    Active member of club or organization: Not on file    Attends meetings of clubs or organizations: Not on file    Relationship status: Not on file  . Intimate partner violence:    Fear of current or ex partner: Not on file    Emotionally abused: Not on file    Physically abused: Not on file    Forced sexual activity: Not on file  Other Topics Concern  . Not on file  Social History Narrative  . Not on file   Family History  Problem Relation Age of Onset  . Stroke Mother     Objective: Office vital signs reviewed. BP (!) 176/95   Pulse (!) 57   Ht 5\' 11"  (1.803 m)   Wt 189 lb (85.7 kg)   BMI 26.36 kg/m   Physical Examination:  General: Awake, alert, well nourished, No acute distress HEENT: Normal    Neck: No masses palpated. No lymphadenopathy    Eyes: extraocular membranes intact, sclera white    Nose: nasal turbinates moist, edematous.  Clear nasal discharge.  Slight deviated septum noted    Throat: moist mucus membranes  Assessment/ Plan: 78 y.o. male  1. Rhinitis medicamentosa Continues to have issues with rhinitis.  Previously tolerated/responded to extended steroids.  I have placed him on a 6-day taper.  We discussed that if he has persistent symptoms would consider reevaluation versus referral to ENT.  Avoid Astelin/Sudafed.  Follow-up with PCP in 1 week for recheck of blood pressure. - predniSONE (STERAPRED UNI-PAK 21 TAB) 10 MG (21) TBPK tablet; As directed x 6 days  Dispense: 21 tablet; Refill: 0   No orders of the defined types were placed in this encounter.  Meds ordered this encounter  Medications  . predniSONE (STERAPRED UNI-PAK 21 TAB) 10 MG (21) TBPK tablet    Sig: As directed x 6 days    Dispense:  21 tablet    Refill:  0      Kevin Moulding, DO [place] [phone]

## 2019-03-04 NOTE — Telephone Encounter (Signed)
Left message to call back  

## 2019-03-04 NOTE — Patient Instructions (Signed)
I am extending your steroids.  We discussed that if symptoms persist, we should consider retrying the Flonase nasal spray vs Rhinocort nasal spray.  We should also consider referral to ENT if needed.    Schedule a follow up with Dr Dettinger to be seen in 1 week for recheck.

## 2019-03-04 NOTE — Telephone Encounter (Signed)
Patient was seen on 02/26/2019 by Sharyn Lull and prescribed Prednisone for Rhinitis medicamentosa.  He has finished this and is still experiencing nasal stuffiness and unable to blow anything out of nose.  He is wondering if he should have another round of Prednisone or if you recommend he be prescribed an antibiotic.  Please advise.

## 2019-03-04 NOTE — Telephone Encounter (Signed)
PT is at [facility] now wanting to know if this will be addressed soon so he can get this filled.

## 2019-03-11 NOTE — Telephone Encounter (Signed)
Patient seen since call.  This encounter will now be closed

## 2019-04-12 DIAGNOSIS — Z452 Encounter for adjustment and management of vascular access device: Secondary | ICD-10-CM | POA: Diagnosis not present

## 2019-06-18 DIAGNOSIS — Z452 Encounter for adjustment and management of vascular access device: Secondary | ICD-10-CM | POA: Diagnosis not present

## 2019-06-26 ENCOUNTER — Other Ambulatory Visit: Payer: Self-pay

## 2019-06-27 ENCOUNTER — Ambulatory Visit (INDEPENDENT_AMBULATORY_CARE_PROVIDER_SITE_OTHER): Payer: Medicare Other | Admitting: Family Medicine

## 2019-06-27 ENCOUNTER — Encounter: Payer: Self-pay | Admitting: Family Medicine

## 2019-06-27 VITALS — BP 147/79 | HR 55 | Temp 97.8°F | Ht 71.0 in | Wt 184.2 lb

## 2019-06-27 DIAGNOSIS — R030 Elevated blood-pressure reading, without diagnosis of hypertension: Secondary | ICD-10-CM | POA: Diagnosis not present

## 2019-06-27 DIAGNOSIS — C252 Malignant neoplasm of tail of pancreas: Secondary | ICD-10-CM | POA: Diagnosis not present

## 2019-06-27 DIAGNOSIS — N529 Male erectile dysfunction, unspecified: Secondary | ICD-10-CM

## 2019-06-27 DIAGNOSIS — Z13220 Encounter for screening for lipoid disorders: Secondary | ICD-10-CM

## 2019-06-27 DIAGNOSIS — K8689 Other specified diseases of pancreas: Secondary | ICD-10-CM | POA: Insufficient documentation

## 2019-06-27 MED ORDER — MUPIROCIN 2 % EX OINT: 1.0000 "application " | TOPICAL_OINTMENT | Freq: Two times a day (BID) | CUTANEOUS | 2 refills | Status: DC

## 2019-06-27 MED ORDER — SILDENAFIL CITRATE 20 MG PO TABS: 20.0000 mg | ORAL_TABLET | ORAL | 1 refills | Status: DC | PRN

## 2019-06-27 NOTE — Progress Notes (Signed)
 BP (!) 147/79   Pulse (!) 55   Temp 97.8 F (36.6 C) (Oral)   Ht 5' 11" (1.803 m)   Wt 184 lb 3.2 oz (83.6 kg)   BMI 25.69 kg/m    Subjective:   Patient ID: Kevin Villegas, male    DOB: 01/27/1941, 78 y.o.   MRN: [id number]  HPI: Kevin Villegas is a 78 y.o. male presenting on 06/27/2019 for Medical Management of Chronic Issues (check up of chronic medical conditions ) and Allergies   HPI Patient has elevated blood pressure and is coming in just for routine visit and physical and checkup.  Patient denies any chest pain or trouble breathing. Patient denies any chest pain, shortness of breath, headaches or vision issues, abdominal complaints, diarrhea, nausea, vomiting, or joint issues.   Patient has been having erectile dysfunction and would like to get medication for that.  He says that he will do it often but when he does do it it does not work as well so would like something to help with that.  Patient is done chemotherapy and is in the monitoring phase from the cancer, also to surgery for which I am assuming is a Whipple, he said this was done at [facility]  Relevant past medical, surgical, family and social history reviewed and updated as indicated. Interim medical history since our last visit reviewed. Allergies and medications reviewed and updated.  Review of Systems  Constitutional: Negative for chills and fever.  Eyes: Negative for discharge.  Respiratory: Negative for shortness of breath and wheezing.   Cardiovascular: Negative for chest pain and leg swelling.  Musculoskeletal: Negative for back pain and gait problem.  Skin: Negative for rash.  Neurological: Negative for dizziness, weakness, light-headedness and numbness.  All other systems reviewed and are negative.   Per HPI unless specifically indicated above   Allergies as of 06/27/2019      Reactions   Pollen Extract Other (See Comments)   Sinus congestion      Medication List       Accurate as of June 27, 2019   2:28 PM. If you have any questions, ask your nurse or doctor.        STOP taking these medications   predniSONE 10 MG (21) Tbpk tablet Commonly known as: STERAPRED UNI-PAK 21 TAB Stopped by: Joshua A Dettinger, MD     TAKE these medications   mupirocin ointment 2 % Commonly known as: Bactroban Place 1 application into the nose 2 (two) times daily. Started by: Joshua A Dettinger, MD   sildenafil 20 MG tablet Commonly known as: REVATIO Take 1-3 tablets (20-60 mg total) by mouth as needed. Started by: Joshua A Dettinger, MD        Objective:   BP (!) 147/79   Pulse (!) 55   Temp 97.8 F (36.6 C) (Oral)   Ht 5' 11" (1.803 m)   Wt 184 lb 3.2 oz (83.6 kg)   BMI 25.69 kg/m   Wt Readings from Last 3 Encounters:  06/27/19 184 lb 3.2 oz (83.6 kg)  03/04/19 189 lb (85.7 kg)  02/26/19 186 lb (84.4 kg)    Physical Exam Vitals signs and nursing note reviewed.  Constitutional:      General: He is not in acute distress.    Appearance: He is well-developed. He is not diaphoretic.  Eyes:     General: No scleral icterus.       Right eye: No discharge.     Conjunctiva/sclera: Conjunctivae normal.       Pupils: Pupils are equal, round, and reactive to light.  Neck:     Musculoskeletal: Neck supple.     Thyroid: No thyromegaly.  Cardiovascular:     Rate and Rhythm: Normal rate and regular rhythm.     Heart sounds: Normal heart sounds. No murmur.  Pulmonary:     Effort: Pulmonary effort is normal. No respiratory distress.     Breath sounds: Normal breath sounds. No wheezing.  Abdominal:     General: Abdomen is flat. Bowel sounds are normal. There is no distension.     Tenderness: There is no abdominal tenderness. There is no right CVA tenderness, left CVA tenderness, guarding or rebound.  Musculoskeletal: Normal range of motion.  Lymphadenopathy:     Cervical: No cervical adenopathy.  Skin:    General: Skin is warm and dry.     Findings: No rash.  Neurological:     Mental  Status: He is alert and oriented to person, place, and time.     Coordination: Coordination normal.  Psychiatric:        Behavior: Behavior normal.       Assessment & Plan:   Problem List Items Addressed This Visit      Digestive   Malignant neoplasm of tail of pancreas ([place])   Relevant Orders   TSH    Other Visit Diagnoses    Elevated BP without diagnosis of hypertension    -  Primary   Relevant Orders   CBC with Differential/Platelet   CMP14+EGFR   TSH   Lipid screening       Relevant Orders   Lipid panel   TSH   Erectile dysfunction, unspecified erectile dysfunction type       Relevant Medications   sildenafil (REVATIO) 20 MG tablet   Other Relevant Orders   TSH      Will start on Viagra, he is still in the maintenance phase and monitoring for his cancer.  He denies any other major health issues, we will keep an eye on blood pressure but allowing permissive hypertension at this point. Follow up plan: Return in about 1 year (around 06/26/2020), or if symptoms worsen or fail to improve.  Counseling provided for all of the vaccine components Orders Placed This Encounter  Procedures  . CBC with Differential/Platelet  . CMP14+EGFR  . Lipid panel  . TSH    Caryl Pina, MD [facility] 06/27/2019, 2:28 PM

## 2019-08-13 DIAGNOSIS — C252 Malignant neoplasm of tail of pancreas: Secondary | ICD-10-CM | POA: Diagnosis not present

## 2019-08-13 DIAGNOSIS — Z452 Encounter for adjustment and management of vascular access device: Secondary | ICD-10-CM | POA: Diagnosis not present

## 2019-08-20 DIAGNOSIS — C252 Malignant neoplasm of tail of pancreas: Secondary | ICD-10-CM | POA: Diagnosis not present

## 2019-08-20 DIAGNOSIS — C259 Malignant neoplasm of pancreas, unspecified: Secondary | ICD-10-CM | POA: Diagnosis not present

## 2019-08-20 DIAGNOSIS — Z452 Encounter for adjustment and management of vascular access device: Secondary | ICD-10-CM | POA: Diagnosis not present

## 2019-08-20 DIAGNOSIS — Z95828 Presence of other vascular implants and grafts: Secondary | ICD-10-CM | POA: Diagnosis not present

## 2019-09-04 DIAGNOSIS — C252 Malignant neoplasm of tail of pancreas: Secondary | ICD-10-CM | POA: Diagnosis not present

## 2019-09-04 DIAGNOSIS — R970 Elevated carcinoembryonic antigen [CEA]: Secondary | ICD-10-CM | POA: Diagnosis not present

## 2019-09-04 DIAGNOSIS — K5730 Diverticulosis of large intestine without perforation or abscess without bleeding: Secondary | ICD-10-CM | POA: Diagnosis not present

## 2019-09-04 DIAGNOSIS — K449 Diaphragmatic hernia without obstruction or gangrene: Secondary | ICD-10-CM | POA: Diagnosis not present

## 2019-09-04 DIAGNOSIS — R918 Other nonspecific abnormal finding of lung field: Secondary | ICD-10-CM | POA: Diagnosis not present

## 2019-09-04 DIAGNOSIS — K7689 Other specified diseases of liver: Secondary | ICD-10-CM | POA: Diagnosis not present

## 2019-09-04 DIAGNOSIS — C259 Malignant neoplasm of pancreas, unspecified: Secondary | ICD-10-CM | POA: Diagnosis not present

## 2019-09-04 DIAGNOSIS — N281 Cyst of kidney, acquired: Secondary | ICD-10-CM | POA: Diagnosis not present

## 2019-09-06 DIAGNOSIS — I77810 Thoracic aortic ectasia: Secondary | ICD-10-CM | POA: Diagnosis not present

## 2019-09-06 DIAGNOSIS — C7800 Secondary malignant neoplasm of unspecified lung: Secondary | ICD-10-CM | POA: Diagnosis not present

## 2019-09-06 DIAGNOSIS — R918 Other nonspecific abnormal finding of lung field: Secondary | ICD-10-CM | POA: Diagnosis not present

## 2019-09-06 DIAGNOSIS — C252 Malignant neoplasm of tail of pancreas: Secondary | ICD-10-CM | POA: Diagnosis not present

## 2019-09-13 DIAGNOSIS — C250 Malignant neoplasm of head of pancreas: Secondary | ICD-10-CM | POA: Diagnosis not present

## 2019-09-23 DIAGNOSIS — T451X5A Adverse effect of antineoplastic and immunosuppressive drugs, initial encounter: Secondary | ICD-10-CM | POA: Diagnosis not present

## 2019-09-23 DIAGNOSIS — R918 Other nonspecific abnormal finding of lung field: Secondary | ICD-10-CM | POA: Diagnosis not present

## 2019-09-23 DIAGNOSIS — Z23 Encounter for immunization: Secondary | ICD-10-CM | POA: Diagnosis not present

## 2019-09-23 DIAGNOSIS — C252 Malignant neoplasm of tail of pancreas: Secondary | ICD-10-CM | POA: Diagnosis not present

## 2019-09-23 DIAGNOSIS — D6489 Other specified anemias: Secondary | ICD-10-CM | POA: Diagnosis not present

## 2019-09-23 DIAGNOSIS — Z903 Acquired absence of stomach [part of]: Secondary | ICD-10-CM | POA: Diagnosis not present

## 2019-09-23 DIAGNOSIS — D701 Agranulocytosis secondary to cancer chemotherapy: Secondary | ICD-10-CM | POA: Diagnosis not present

## 2019-09-23 DIAGNOSIS — Z8719 Personal history of other diseases of the digestive system: Secondary | ICD-10-CM | POA: Diagnosis not present

## 2019-09-23 DIAGNOSIS — I358 Other nonrheumatic aortic valve disorders: Secondary | ICD-10-CM | POA: Diagnosis not present

## 2019-09-23 DIAGNOSIS — I77819 Aortic ectasia, unspecified site: Secondary | ICD-10-CM | POA: Diagnosis not present

## 2019-09-23 DIAGNOSIS — Z90411 Acquired partial absence of pancreas: Secondary | ICD-10-CM | POA: Diagnosis not present

## 2019-09-23 DIAGNOSIS — Z9081 Acquired absence of spleen: Secondary | ICD-10-CM | POA: Diagnosis not present

## 2019-11-12 DIAGNOSIS — Z452 Encounter for adjustment and management of vascular access device: Secondary | ICD-10-CM | POA: Diagnosis not present

## 2019-11-12 DIAGNOSIS — C259 Malignant neoplasm of pancreas, unspecified: Secondary | ICD-10-CM | POA: Diagnosis not present

## 2019-11-24 ENCOUNTER — Encounter (HOSPITAL_COMMUNITY): Payer: Self-pay | Admitting: *Deleted

## 2019-11-24 ENCOUNTER — Other Ambulatory Visit: Payer: Self-pay

## 2019-11-24 ENCOUNTER — Emergency Department (HOSPITAL_COMMUNITY)
Admission: EM | Admit: 2019-11-24 | Discharge: 2019-11-25 | Disposition: A | Discharge status: Home / Self Care | Payer: Medicare Other | Attending: Emergency Medicine | Admitting: Emergency Medicine

## 2019-11-24 ENCOUNTER — Emergency Department (HOSPITAL_COMMUNITY): Payer: Medicare Other

## 2019-11-24 DIAGNOSIS — Z20828 Contact with and (suspected) exposure to other viral communicable diseases: Secondary | ICD-10-CM | POA: Insufficient documentation

## 2019-11-24 DIAGNOSIS — C7800 Secondary malignant neoplasm of unspecified lung: Secondary | ICD-10-CM | POA: Insufficient documentation

## 2019-11-24 DIAGNOSIS — C252 Malignant neoplasm of tail of pancreas: Secondary | ICD-10-CM | POA: Insufficient documentation

## 2019-11-24 DIAGNOSIS — C259 Malignant neoplasm of pancreas, unspecified: Secondary | ICD-10-CM

## 2019-11-24 DIAGNOSIS — R1110 Vomiting, unspecified: Secondary | ICD-10-CM

## 2019-11-24 DIAGNOSIS — R109 Unspecified abdominal pain: Secondary | ICD-10-CM | POA: Diagnosis present

## 2019-11-24 LAB — COMPREHENSIVE METABOLIC PANEL
ALT: 26 U/L (ref 0–44)
AST: 30 U/L (ref 15–41)
Albumin: 4.2 g/dL (ref 3.5–5.0)
Alkaline Phosphatase: 62 U/L (ref 38–126)
Anion gap: 11 (ref 5–15)
BUN: 16 mg/dL (ref 8–23)
CO2: 23 mmol/L (ref 22–32)
Calcium: 9.6 mg/dL (ref 8.9–10.3)
Chloride: 104 mmol/L (ref 98–111)
Creatinine, Ser: 1.13 mg/dL (ref 0.61–1.24)
GFR calc Af Amer: >60 mL/min (ref >60)
GFR calc non Af Amer: >60 mL/min (ref >60)
Glucose, Bld: 132 mg/dL — ABNORMAL HIGH (ref 70–99)
Potassium: 4.6 mmol/L (ref 3.5–5.1)
Sodium: 138 mmol/L (ref 135–145)
Total Bilirubin: 0.6 mg/dL (ref 0.3–1.2)
Total Protein: 7.7 g/dL (ref 6.5–8.1)

## 2019-11-24 LAB — LIPASE, BLOOD: Lipase: 41 U/L (ref 11–51)

## 2019-11-24 LAB — CBC
HCT: 50.1 % (ref 39.0–52.0)
Hemoglobin: 16.6 g/dL (ref 13.0–17.0)
MCH: 32.4 pg (ref 26.0–34.0)
MCHC: 33.1 g/dL (ref 30.0–36.0)
MCV: 97.7 fL (ref 80.0–100.0)
Platelets: 328 10*3/uL (ref 150–400)
RBC: 5.13 MIL/uL (ref 4.22–5.81)
RDW: 13.3 % (ref 11.5–15.5)
WBC: 15.8 10*3/uL — ABNORMAL HIGH (ref 4.0–10.5)
nRBC: 0 % (ref 0.0–0.2)

## 2019-11-24 LAB — URINALYSIS, ROUTINE W REFLEX MICROSCOPIC
Bilirubin Urine: NEGATIVE
Glucose, UA: NEGATIVE mg/dL
Hgb urine dipstick: NEGATIVE
Ketones, ur: 5 mg/dL — AB
Leukocytes,Ua: NEGATIVE
Nitrite: NEGATIVE
Protein, ur: NEGATIVE mg/dL
Specific Gravity, Urine: 1.019 (ref 1.005–1.030)
pH: 5 (ref 5.0–8.0)

## 2019-11-24 LAB — POC SARS CORONAVIRUS 2 AG -  ED: SARS Coronavirus 2 Ag: NEGATIVE

## 2019-11-24 MED ORDER — METOCLOPRAMIDE HCL 5 MG/ML IJ SOLN
10.0000 mg | Freq: Once | INTRAMUSCULAR | Status: AC
  Administered 2019-11-24: 10 mg via INTRAVENOUS
  Filled 2019-11-24: qty 2

## 2019-11-24 MED ORDER — FENTANYL CITRATE (PF) 100 MCG/2ML IJ SOLN
50.0000 ug | Freq: Once | INTRAMUSCULAR | Status: AC
  Administered 2019-11-24: 50 ug via INTRAVENOUS
  Filled 2019-11-24: qty 2

## 2019-11-24 MED ORDER — ONDANSETRON HCL 4 MG/2ML IJ SOLN
4.0000 mg | Freq: Once | INTRAMUSCULAR | Status: AC
  Administered 2019-11-24: 4 mg via INTRAVENOUS
  Filled 2019-11-24: qty 2

## 2019-11-24 MED ORDER — ONDANSETRON HCL 4 MG/2ML IJ SOLN: 4.0000 mg | Freq: Once | INTRAMUSCULAR | Status: DC

## 2019-11-24 MED ORDER — FENTANYL CITRATE (PF) 100 MCG/2ML IJ SOLN
50.0000 ug | INTRAMUSCULAR | Status: DC | PRN
  Administered 2019-11-25 (×2): 50 ug via INTRAVENOUS
  Filled 2019-11-24 (×2): qty 2

## 2019-11-24 MED ORDER — SODIUM CHLORIDE 0.9 % IV BOLUS
500.0000 mL | Freq: Once | INTRAVENOUS | Status: AC
  Administered 2019-11-24: 500 mL via INTRAVENOUS

## 2019-11-24 MED ORDER — ONDANSETRON 4 MG PO TBDP: 4.0000 mg | ORAL_TABLET | Freq: Once | ORAL | Status: DC

## 2019-11-24 MED ORDER — PANTOPRAZOLE SODIUM 40 MG PO TBEC: 40.0000 mg | DELAYED_RELEASE_TABLET | Freq: Once | ORAL | Status: DC

## 2019-11-24 MED ORDER — ONDANSETRON 4 MG PO TBDP
4.0000 mg | ORAL_TABLET | Freq: Once | ORAL | Status: AC
  Administered 2019-11-24: 4 mg via ORAL
  Filled 2019-11-24: qty 1

## 2019-11-24 MED ORDER — SODIUM CHLORIDE 0.9 % IV SOLN
80.0000 mg | Freq: Once | INTRAVENOUS | Status: AC
  Administered 2019-11-24: 80 mg via INTRAVENOUS
  Filled 2019-11-24: qty 80

## 2019-11-24 MED ORDER — IOHEXOL 300 MG/ML  SOLN
100.0000 mL | Freq: Once | INTRAMUSCULAR | Status: AC | PRN
  Administered 2019-11-24: 100 mL via INTRAVENOUS

## 2019-11-24 MED ORDER — SODIUM CHLORIDE 0.9% FLUSH: 3.0000 mL | Freq: Once | INTRAVENOUS | Status: DC

## 2019-11-24 NOTE — ED Notes (Signed)
Called [facility] for transport to [facility], stated it would be a couple hours.

## 2019-11-24 NOTE — ED Notes (Signed)
Patient transported to CT 

## 2019-11-24 NOTE — ED Provider Notes (Signed)
[facility] EMERGENCY DEPARTMENT Provider Note   CSN: [id number] Arrival date & time: 11/24/19  1546     History   Chief Complaint Chief Complaint  Patient presents with   Abdominal Pain    HPI Kevin Villegas is a 78 y.o. male presenting with a constant deep abdominal pain x 2-3 days ago with worsening pain last night. Past medical history notable for pancreatic cancer (2018) s/p distal pancreatectomy, splenectomy, and partial gastrectomy in 10/2017 with adjuvant chemotherapy and new pulmonary nodules concerning for malignancy followed at [facility], cholecystectomy in 2019. Patient notes he had a history of constipation/diarrhea after his partial gastrectomy but this has normalized over time. He notes he had a small bowel movement this morning that was normal appearing and brown color. Pain did not change with bowel movement. He notes he develops early satiety and fullness with food, burning feeling in his stomach, and sour taste in his mouth. He treated the pain last night with a dose of Pepcid which improved the pain. He developed nausea/vomiting this morning that was green in color. He ate a small part of a muffin without change in pain. He denies any medications. Denies any use of NSAIDs. Denies recent travel or gatherings. Denies fevers, chills, cough, congestion. Denies any unexplained weight loss.  Patient denies smoking, alcohol, or illicit drugs. Denies any significant cardiovascular history. Mother died of a stroke in her 62's and Father died in 49's.    The history is provided by the patient, the spouse and medical records.    Past Medical History:  Diagnosis Date   Medical history non-contributory    Pancreatic cancer [facility])     Patient Active Problem List   Diagnosis Date Noted   Pancreatic mass 06/27/2019   Malignant neoplasm of tail of pancreas ([place]) 06/27/2019   Rhinitis medicamentosa 02/26/2019    Past Surgical History:  Procedure  Laterality Date   CHOLECYSTECTOMY N/A 12/16/2014   Procedure: LAPAROSCOPIC CHOLECYSTECTOMY WITH INTRAOPERATIVE CHOLANGIOGRAM;  Surgeon: Georganna Skeans, MD;  Location: [place];  Service: General;  Laterality: N/A;   EUS N/A 09/24/2015   Procedure: UPPER ENDOSCOPIC ULTRASOUND (EUS) LINEAR;  Surgeon: Milus Banister, MD;  Location: [facility];  Service: Endoscopy;  Laterality: N/A;   VASECTOMY          Home Medications    Prior to Admission medications   Medication Sig Start Date End Date Taking? Authorizing Provider  Calcium Carbonate Antacid (TUMS ULTRA 1000 PO) Take 2,000 mg by mouth once.   Yes [provider]  mupirocin ointment (BACTROBAN) 2 % Place 1 application into the nose 2 (two) times daily. Patient not taking: Reported on 11/24/2019 06/27/19   Dettinger, [name], MD  sildenafil (REVATIO) 20 MG tablet Take 1-3 tablets (20-60 mg total) by mouth as needed. Patient not taking: Reported on 11/24/2019 06/27/19   Dettinger, [name], MD    Family History Family History  Problem Relation Age of Onset   Stroke Mother     Social History Social History   Tobacco Use   Smoking status: Never Smoker   Smokeless tobacco: Never Used  Substance Use Topics   Alcohol use: No    Alcohol/week: 0.0 standard drinks   Drug use: No     Allergies   Pollen extract   Review of Systems Review of Systems  Constitutional: Positive for appetite change. Negative for chills and fever.  HENT: Negative for congestion and sore throat.   Respiratory: Negative for cough  and shortness of breath.   Gastrointestinal: Positive for abdominal distention, abdominal pain, nausea and vomiting. Negative for blood in stool, constipation and diarrhea.  Endocrine: Negative for cold intolerance, heat intolerance, polydipsia, polyphagia and polyuria.  Genitourinary: Negative for dysuria, frequency and hematuria.  Musculoskeletal: Negative for back pain.  Neurological: Negative for tremors and  weakness.     Physical Exam Updated Vital Signs BP (!) 141/97    Pulse 80    Temp 98 F (36.7 C) (Oral)    Resp 18    SpO2 97%   Physical Exam Constitutional:      Appearance: He is well-developed and normal weight.  HENT:     Head: Normocephalic and atraumatic.  Cardiovascular:     Rate and Rhythm: Normal rate and regular rhythm.     Heart sounds: Murmur present.  Pulmonary:     Effort: Pulmonary effort is normal.     Breath sounds: Normal breath sounds.  Chest:    Abdominal:     General: Abdomen is flat. A surgical scar is present. Bowel sounds are decreased. There is no distension or abdominal bruit. There are no signs of injury.     Palpations: Abdomen is soft. There is no hepatomegaly.     Tenderness: There is abdominal tenderness in the epigastric area and periumbilical area. There is no guarding or rebound. Negative signs include McBurney's sign.     Hernia: No hernia is present.  Skin:    General: Skin is warm and dry.     Capillary Refill: Capillary refill takes less than 2 seconds.  Neurological:     Mental Status: He is alert.    ED Treatments / Results  Labs (all labs ordered are listed, but only abnormal results are displayed) Labs Reviewed  COMPREHENSIVE METABOLIC PANEL - Abnormal; Notable for the following components:      Result Value   Glucose, Bld 132 (*)    All other components within normal limits  CBC - Abnormal; Notable for the following components:   WBC 15.8 (*)    All other components within normal limits  URINALYSIS, ROUTINE W REFLEX MICROSCOPIC - Abnormal; Notable for the following components:   Ketones, ur 5 (*)    All other components within normal limits  SARS CORONAVIRUS 2 (TAT 6-24 HRS)  LIPASE, BLOOD    EKG None  Radiology Ct Abdomen Pelvis W Contrast  Result Date: 11/24/2019 CLINICAL DATA:  78 year old male with nausea vomiting and abdominal pain. Status post prior distal pancreatectomy pancreatic cancer. EXAM: CT ABDOMEN  AND PELVIS WITH CONTRAST TECHNIQUE: Multidetector CT imaging of the abdomen and pelvis was performed using the standard protocol following bolus administration of intravenous contrast. CONTRAST:  137mL OMNIPAQUE IOHEXOL 300 MG/ML  SOLN COMPARISON:  CT of the abdomen pelvis dated 04/12/2017. FINDINGS: Lower chest: Several scattered bilateral pulmonary nodules, new since the prior CT most concerning for metastatic disease. These measure up to approximately 1 cm in the right lower lobe. The visualized lung bases are otherwise clear. No intra-abdominal free air or free fluid. Hepatobiliary: Slight irregularity of the liver contour may represent early changes of cirrhosis. Clinical correlation is recommended. Several hepatic hypodense lesions similar or minimally increased since the prior CT and demonstrate fluid attenuation most consistent with cysts. These measure up to 2 cm in the inferior right lobe of the liver. No intrahepatic biliary ductal dilatation. Cholecystectomy. No retained calcified stone noted in the central CBD. Pancreas: Status post resection of the body and tail of the  pancreas. The head/uncinate process of the pancreas appears unremarkable. Spleen: Splenectomy. Adrenals/Urinary Tract: The adrenal glands are unremarkable. Moderate bilateral renal parenchyma atrophy. Multiple bilateral parapelvic cysts as well as a 3 cm right renal upper pole cysts noted. There is no hydronephrosis on either side. There is symmetric enhancement and excretion of contrast both kidneys. The visualized ureters and urinary bladder appear unremarkable. Stomach/Bowel: There is distention of the stomach and duodenum. There is a focal area of narrowing in the distal duodenum in the region of the ligaments of Treitz. There is an ill-defined 2.5 x 2.5 cm nodular soft tissue mass (series 3, image 27 and coronal series 6, image 62) most concerning for metastatic disease. Focal area of high-grade narrowing of the duodenum may be  related to mass effect or infiltration of tumor and associated stricture. The third portion of the duodenum measures approximately 5.5 cm in diameter. There is diffuse thickened appearance of the wall of the stomach which may be reactive edema although infiltration by neoplastic process is not excluded. There is severe sigmoid diverticulosis and diffuse colonic diverticula without active inflammatory changes. The appendix is normal. Vascular/Lymphatic: The abdominal aorta and IVC are unremarkable. No portal venous gas. Mildly rounded gastrohepatic lymph nodes measure 1 cm. An 8 mm nodular density inferior to the duodenal bulb coronal series 6, image 55 as well as several additional nodules in the mesentery, likely mildly rounded lymph nodes. Several additional nodular densities within the pelvis and adjacent to the sigmoid colon including an 8 mm nodule (series 3, image 68) are also concerning for metastatic disease. Reproductive: The prostate and seminal vesicles are grossly unremarkable. The testicles appear to be located within the inguinal canal. Other: Small fat containing umbilical hernia. Musculoskeletal: Degenerative changes of the spine. No acute osseous pathology. IMPRESSION: 1. Focal area of high-grade narrowing of the distal duodenum in the region of the ligaments of Treitz likely secondary to an adjacent serosal metastatic implant. 2. Diffuse thickened appearance of the wall of the stomach may be reactive edema although infiltration by neoplastic process is not excluded. 3. New pulmonary and mesenteric metastatic disease. 4. Colonic diverticulosis. Normal appendix. Electronically Signed   By: Anner Crete M.D.   On: 11/24/2019 21:41    Procedures Procedures (including critical care time)  Medications Ordered in ED Medications  sodium chloride flush (NS) 0.9 % injection 3 mL (has no administration in time range)  sodium chloride 0.9 % bolus 500 mL (has no administration in time range)    fentaNYL (SUBLIMAZE) injection 50 mcg (has no administration in time range)  ondansetron (ZOFRAN-ODT) disintegrating tablet 4 mg (4 mg Oral Given 11/24/19 1729)  pantoprazole (PROTONIX) 80 mg in sodium chloride 0.9 % 100 mL IVPB (0 mg Intravenous Stopped 11/24/19 1945)  fentaNYL (SUBLIMAZE) injection 50 mcg (50 mcg Intravenous Given 11/24/19 1901)  ondansetron (ZOFRAN) injection 4 mg (4 mg Intravenous Given 11/24/19 1929)  metoCLOPramide (REGLAN) injection 10 mg (10 mg Intravenous Given 11/24/19 2023)  iohexol (OMNIPAQUE) 300 MG/ML solution 100 mL (100 mLs Intravenous Contrast Given 11/24/19 2050)  metoCLOPramide (REGLAN) injection 10 mg (10 mg Intravenous Given 11/24/19 2303)     Initial Impression / Assessment and Plan / ED Course  I have reviewed the triage vital signs and the nursing notes.  Pertinent labs & imaging results that were available during my care of the patient were reviewed by me and considered in my medical decision making (see chart for details).  78 y.o male with history of pancreatic cancer (2018)  s/p distal pancreatectomy, splenectomy, and partial gastrectomy in 10/2017 with adjuvant chemotherapy, new pulmonary nodules concerning for malignancy, and cholecystectomy (2019) presenting with acute epigastric abdominal pain.    Patient is overall well appearing with epigastric pain to palpation and continuous nausea and vomiting refractory to several doses of Zofran. Most concerning differential includes metastatic disease to the GI tract given patient significant history leading to obstruction. Differential also includes functional dyspepsia vs PUD vs GERD vs ideopathic gastroparesis. Unlikely pancreatitis given normal Lipase. History of cholecystectomy rules out cholangitis. VSS and lack of respiratory symptoms or chest pain makes MI or pneumonia unlikely cause. Remaining labs (CBC, CMP) WNL and UA without signs of infection. Other possible etiologies include intestinal ischemia. Will  obtain CT abdomen/pelvis with contrast at this time. If inconclusive will plan to get CT angiogram for further evaluation. Considered obtaining lactate to evaluate for ischemia however per patient request to no longer have labs obtained, this was postponed pending CT results.  CT with contrast obtained and notable for "high grade narrowing of the distal duodenum in the region of the ligaments of Treitz" likely metastatic in nature with diffuse thickening of the wall of the stomach concerning for reactive edema vs infiltration by neoplastic process, as well as new pulmonary and mesenteric metastatic disease. This is likely cause of patients persistent epigastric pain and nausea/vomiting. Current symptomatic treatment with Protonix, Zofran/Reglan, and Fentanyl PRN.  Discussed results with patient and answered all questions. Given current ongoing oncology treatment for known metastatic disease at [facility], feel transfer to [facility] would be in the best interest of the patient. Spoke to oncologist Dr. Dorcas Mcmurray at [facility] who has accepted the transfer.  Patient is currently awaiting bed placement.   Final Clinical Impressions(s) / ED Diagnoses   Final diagnoses:  Pancreatic cancer metastasized to lung [facility])  Vomiting in adult    ED Discharge Orders    None       Danna Hefty, DO 11/24/19 2305    Elnora Morrison, MD 11/25/19 [phone]

## 2019-11-24 NOTE — ED Triage Notes (Signed)
Pt with pain just above the navel that started this morning. LBM today, reports normal. Denies fever. Hx of abdominal surgery for pancreatic cancer.

## 2019-11-25 DIAGNOSIS — C252 Malignant neoplasm of tail of pancreas: Secondary | ICD-10-CM | POA: Diagnosis not present

## 2019-11-25 DIAGNOSIS — C7800 Secondary malignant neoplasm of unspecified lung: Secondary | ICD-10-CM | POA: Diagnosis not present

## 2019-11-25 DIAGNOSIS — K5790 Diverticulosis of intestine, part unspecified, without perforation or abscess without bleeding: Secondary | ICD-10-CM | POA: Diagnosis not present

## 2019-11-25 DIAGNOSIS — Z20828 Contact with and (suspected) exposure to other viral communicable diseases: Secondary | ICD-10-CM | POA: Diagnosis not present

## 2019-11-25 DIAGNOSIS — R109 Unspecified abdominal pain: Secondary | ICD-10-CM | POA: Diagnosis present

## 2019-11-25 DIAGNOSIS — K315 Obstruction of duodenum: Secondary | ICD-10-CM | POA: Diagnosis not present

## 2019-11-25 DIAGNOSIS — Z8507 Personal history of malignant neoplasm of pancreas: Secondary | ICD-10-CM | POA: Diagnosis not present

## 2019-11-25 MED ORDER — ONDANSETRON HCL 4 MG/2ML IJ SOLN
4.0000 mg | Freq: Once | INTRAMUSCULAR | Status: AC
  Administered 2019-11-25: 4 mg via INTRAVENOUS
  Filled 2019-11-25: qty 2

## 2019-11-25 NOTE — ED Provider Notes (Signed)
Signed out to dr Tamera Punt at shift change, will need EMTALA update prior to txfer to [facility] Accepted by Dr. Claudia Desanctis, MD 11/25/19 [phone]

## 2019-11-25 NOTE — ED Notes (Signed)
Charge RN contacted [facility] to verify they are aware that patient is still awaiting transfer to [facility]

## 2019-11-25 NOTE — ED Notes (Signed)
Pt reports feeling nauseated, Dr. Christy Gentles made aware

## 2019-11-25 NOTE — ED Notes (Signed)
Pt being transported to [facility] by [facility] at this time. VSS, NAD. Given 24mcg fentanyl prior to leaving.

## 2019-11-27 DIAGNOSIS — C170 Malignant neoplasm of duodenum: Secondary | ICD-10-CM | POA: Diagnosis not present

## 2019-11-27 DIAGNOSIS — C259 Malignant neoplasm of pancreas, unspecified: Secondary | ICD-10-CM | POA: Diagnosis not present

## 2019-11-27 DIAGNOSIS — R112 Nausea with vomiting, unspecified: Secondary | ICD-10-CM | POA: Diagnosis not present

## 2019-11-27 DIAGNOSIS — Z980 Intestinal bypass and anastomosis status: Secondary | ICD-10-CM | POA: Diagnosis not present

## 2019-11-27 DIAGNOSIS — K315 Obstruction of duodenum: Secondary | ICD-10-CM | POA: Diagnosis not present

## 2019-11-27 DIAGNOSIS — K208 Other esophagitis without bleeding: Secondary | ICD-10-CM | POA: Diagnosis not present

## 2019-11-28 DIAGNOSIS — K315 Obstruction of duodenum: Secondary | ICD-10-CM | POA: Diagnosis not present

## 2019-11-28 DIAGNOSIS — C252 Malignant neoplasm of tail of pancreas: Secondary | ICD-10-CM | POA: Diagnosis not present

## 2019-11-29 DIAGNOSIS — K315 Obstruction of duodenum: Secondary | ICD-10-CM | POA: Diagnosis not present

## 2019-11-29 MED ORDER — QUINERVA 260 MG PO TABS
650.00 | ORAL_TABLET | ORAL | Status: DC
Start: ? — End: 2019-11-29

## 2019-11-29 MED ORDER — EQUATE NICOTINE 4 MG MT GUM
4.00 | CHEWING_GUM | OROMUCOSAL | Status: DC
Start: ? — End: 2019-11-29

## 2019-11-29 MED ORDER — LOVENOX 150 MG/ML ~~LOC~~ SOLN
0.50 | SUBCUTANEOUS | Status: DC
Start: ? — End: 2019-11-29

## 2019-11-29 MED ORDER — TUSSI PRES-B 2-15-200 MG/5ML PO LIQD
0.50 | ORAL | Status: DC
Start: ? — End: 2019-11-29

## 2019-11-29 MED ORDER — BAYER WOMENS 81-300 MG PO TABS
40.00 | ORAL_TABLET | ORAL | Status: DC
Start: 2019-11-30 — End: 2019-11-29

## 2019-12-09 DIAGNOSIS — C259 Malignant neoplasm of pancreas, unspecified: Secondary | ICD-10-CM | POA: Diagnosis not present

## 2019-12-31 DIAGNOSIS — C252 Malignant neoplasm of tail of pancreas: Secondary | ICD-10-CM | POA: Diagnosis not present

## 2020-01-06 DIAGNOSIS — K449 Diaphragmatic hernia without obstruction or gangrene: Secondary | ICD-10-CM | POA: Diagnosis not present

## 2020-01-06 DIAGNOSIS — C252 Malignant neoplasm of tail of pancreas: Secondary | ICD-10-CM | POA: Diagnosis not present

## 2020-01-06 DIAGNOSIS — C259 Malignant neoplasm of pancreas, unspecified: Secondary | ICD-10-CM | POA: Diagnosis not present

## 2020-01-06 DIAGNOSIS — R590 Localized enlarged lymph nodes: Secondary | ICD-10-CM | POA: Diagnosis not present

## 2020-01-06 DIAGNOSIS — I2510 Atherosclerotic heart disease of native coronary artery without angina pectoris: Secondary | ICD-10-CM | POA: Diagnosis not present

## 2020-01-06 DIAGNOSIS — K5730 Diverticulosis of large intestine without perforation or abscess without bleeding: Secondary | ICD-10-CM | POA: Diagnosis not present

## 2020-01-27 DIAGNOSIS — R918 Other nonspecific abnormal finding of lung field: Secondary | ICD-10-CM | POA: Diagnosis not present

## 2020-01-27 DIAGNOSIS — C252 Malignant neoplasm of tail of pancreas: Secondary | ICD-10-CM | POA: Diagnosis not present

## 2020-02-17 DIAGNOSIS — C252 Malignant neoplasm of tail of pancreas: Secondary | ICD-10-CM | POA: Diagnosis not present

## 2020-02-17 DIAGNOSIS — C259 Malignant neoplasm of pancreas, unspecified: Secondary | ICD-10-CM | POA: Diagnosis not present

## 2020-03-09 DIAGNOSIS — C259 Malignant neoplasm of pancreas, unspecified: Secondary | ICD-10-CM | POA: Diagnosis not present

## 2020-03-09 DIAGNOSIS — C252 Malignant neoplasm of tail of pancreas: Secondary | ICD-10-CM | POA: Diagnosis not present

## 2020-03-27 DIAGNOSIS — R590 Localized enlarged lymph nodes: Secondary | ICD-10-CM | POA: Diagnosis not present

## 2020-03-27 DIAGNOSIS — N281 Cyst of kidney, acquired: Secondary | ICD-10-CM | POA: Diagnosis not present

## 2020-03-27 DIAGNOSIS — C252 Malignant neoplasm of tail of pancreas: Secondary | ICD-10-CM | POA: Diagnosis not present

## 2020-03-27 DIAGNOSIS — R911 Solitary pulmonary nodule: Secondary | ICD-10-CM | POA: Diagnosis not present

## 2020-03-27 DIAGNOSIS — K5730 Diverticulosis of large intestine without perforation or abscess without bleeding: Secondary | ICD-10-CM | POA: Diagnosis not present

## 2020-03-27 DIAGNOSIS — C259 Malignant neoplasm of pancreas, unspecified: Secondary | ICD-10-CM | POA: Diagnosis not present

## 2020-03-30 DIAGNOSIS — C252 Malignant neoplasm of tail of pancreas: Secondary | ICD-10-CM | POA: Diagnosis not present

## 2020-05-12 DIAGNOSIS — C252 Malignant neoplasm of tail of pancreas: Secondary | ICD-10-CM | POA: Diagnosis not present

## 2020-07-13 DIAGNOSIS — Z7189 Other specified counseling: Secondary | ICD-10-CM | POA: Diagnosis not present

## 2020-07-13 DIAGNOSIS — C252 Malignant neoplasm of tail of pancreas: Secondary | ICD-10-CM | POA: Diagnosis not present

## 2020-07-13 DIAGNOSIS — Z79899 Other long term (current) drug therapy: Secondary | ICD-10-CM | POA: Diagnosis not present

## 2020-07-13 DIAGNOSIS — Z95828 Presence of other vascular implants and grafts: Secondary | ICD-10-CM | POA: Diagnosis not present

## 2020-07-13 DIAGNOSIS — C259 Malignant neoplasm of pancreas, unspecified: Secondary | ICD-10-CM | POA: Diagnosis not present

## 2020-09-07 DIAGNOSIS — Z452 Encounter for adjustment and management of vascular access device: Secondary | ICD-10-CM | POA: Diagnosis not present

## 2020-09-07 DIAGNOSIS — C259 Malignant neoplasm of pancreas, unspecified: Secondary | ICD-10-CM | POA: Diagnosis not present

## 2020-09-07 DIAGNOSIS — Z95828 Presence of other vascular implants and grafts: Secondary | ICD-10-CM | POA: Diagnosis not present

## 2020-09-23 DIAGNOSIS — Z23 Encounter for immunization: Secondary | ICD-10-CM | POA: Diagnosis not present

## 2020-10-05 DIAGNOSIS — Z95828 Presence of other vascular implants and grafts: Secondary | ICD-10-CM | POA: Diagnosis not present

## 2020-10-05 DIAGNOSIS — Z9689 Presence of other specified functional implants: Secondary | ICD-10-CM | POA: Diagnosis not present

## 2020-10-05 DIAGNOSIS — Z7185 Encounter for immunization safety counseling: Secondary | ICD-10-CM | POA: Diagnosis not present

## 2020-10-05 DIAGNOSIS — Z90410 Acquired total absence of pancreas: Secondary | ICD-10-CM | POA: Diagnosis not present

## 2020-10-05 DIAGNOSIS — C252 Malignant neoplasm of tail of pancreas: Secondary | ICD-10-CM | POA: Diagnosis not present

## 2020-10-05 DIAGNOSIS — C259 Malignant neoplasm of pancreas, unspecified: Secondary | ICD-10-CM | POA: Diagnosis not present

## 2020-10-05 DIAGNOSIS — Z9081 Acquired absence of spleen: Secondary | ICD-10-CM | POA: Diagnosis not present

## 2020-10-05 DIAGNOSIS — Z79899 Other long term (current) drug therapy: Secondary | ICD-10-CM | POA: Diagnosis not present

## 2020-10-05 DIAGNOSIS — Z452 Encounter for adjustment and management of vascular access device: Secondary | ICD-10-CM | POA: Diagnosis not present

## 2020-10-05 DIAGNOSIS — Z903 Acquired absence of stomach [part of]: Secondary | ICD-10-CM | POA: Diagnosis not present

## 2020-10-23 DIAGNOSIS — Z23 Encounter for immunization: Secondary | ICD-10-CM | POA: Diagnosis not present

## 2020-11-30 DIAGNOSIS — C259 Malignant neoplasm of pancreas, unspecified: Secondary | ICD-10-CM | POA: Diagnosis not present

## 2020-11-30 DIAGNOSIS — Z452 Encounter for adjustment and management of vascular access device: Secondary | ICD-10-CM | POA: Diagnosis not present

## 2021-04-19 ENCOUNTER — Encounter: Payer: Self-pay | Admitting: Family Medicine

## 2021-04-19 DIAGNOSIS — R339 Retention of urine, unspecified: Secondary | ICD-10-CM

## 2021-04-21 ENCOUNTER — Encounter: Payer: Self-pay | Admitting: Family Medicine

## 2021-04-22 ENCOUNTER — Telehealth: Payer: Self-pay | Admitting: *Deleted

## 2021-04-22 NOTE — Telephone Encounter (Signed)
Per referral  04/21/21 gave upcoming appointments - mailed calendar with welcome packet

## 2021-04-28 ENCOUNTER — Inpatient Hospital Stay: Payer: Medicare Other | Attending: Hematology & Oncology

## 2021-04-28 ENCOUNTER — Encounter: Payer: Self-pay | Admitting: Hematology & Oncology

## 2021-04-28 ENCOUNTER — Other Ambulatory Visit: Payer: Self-pay

## 2021-04-28 ENCOUNTER — Inpatient Hospital Stay (HOSPITAL_BASED_OUTPATIENT_CLINIC_OR_DEPARTMENT_OTHER): Payer: Medicare Other | Admitting: Hematology & Oncology

## 2021-04-28 VITALS — BP 165/92 | HR 55 | Temp 98.3°F | Resp 18 | Ht 71.0 in | Wt 164.0 lb

## 2021-04-28 DIAGNOSIS — N289 Disorder of kidney and ureter, unspecified: Secondary | ICD-10-CM | POA: Diagnosis not present

## 2021-04-28 DIAGNOSIS — R918 Other nonspecific abnormal finding of lung field: Secondary | ICD-10-CM | POA: Insufficient documentation

## 2021-04-28 DIAGNOSIS — C799 Secondary malignant neoplasm of unspecified site: Secondary | ICD-10-CM | POA: Diagnosis not present

## 2021-04-28 DIAGNOSIS — C259 Malignant neoplasm of pancreas, unspecified: Secondary | ICD-10-CM | POA: Diagnosis present

## 2021-04-28 DIAGNOSIS — C252 Malignant neoplasm of tail of pancreas: Secondary | ICD-10-CM

## 2021-04-28 DIAGNOSIS — N1330 Unspecified hydronephrosis: Secondary | ICD-10-CM | POA: Diagnosis not present

## 2021-04-28 LAB — CMP (CANCER CENTER ONLY)
ALT: 18 U/L (ref 0–44)
AST: 27 U/L (ref 15–41)
Albumin: 4.4 g/dL (ref 3.5–5.0)
Alkaline Phosphatase: 65 U/L (ref 38–126)
Anion gap: 8 (ref 5–15)
BUN: 26 mg/dL — ABNORMAL HIGH (ref 8–23)
CO2: 26 mmol/L (ref 22–32)
Calcium: 9.8 mg/dL (ref 8.9–10.3)
Chloride: 102 mmol/L (ref 98–111)
Creatinine: 1.71 mg/dL — ABNORMAL HIGH (ref 0.61–1.24)
GFR, Estimated: 40 mL/min — ABNORMAL LOW (ref >60)
Glucose, Bld: 91 mg/dL (ref 70–99)
Potassium: 4.6 mmol/L (ref 3.5–5.1)
Sodium: 136 mmol/L (ref 135–145)
Total Bilirubin: 0.5 mg/dL (ref 0.3–1.2)
Total Protein: 7 g/dL (ref 6.5–8.1)

## 2021-04-28 LAB — CBC WITH DIFFERENTIAL (CANCER CENTER ONLY)
Abs Immature Granulocytes: 0.01 10*3/uL (ref 0.00–0.07)
Basophils Absolute: 0.1 10*3/uL (ref 0.0–0.1)
Basophils Relative: 1 %
Eosinophils Absolute: 0.2 10*3/uL (ref 0.0–0.5)
Eosinophils Relative: 3 %
HCT: 42.1 % (ref 39.0–52.0)
Hemoglobin: 13.9 g/dL (ref 13.0–17.0)
Immature Granulocytes: 0 %
Lymphocytes Relative: 24 %
Lymphs Abs: 1.8 10*3/uL (ref 0.7–4.0)
MCH: 31.7 pg (ref 26.0–34.0)
MCHC: 33 g/dL (ref 30.0–36.0)
MCV: 96.1 fL (ref 80.0–100.0)
Monocytes Absolute: 1 10*3/uL (ref 0.1–1.0)
Monocytes Relative: 13 %
Neutro Abs: 4.3 10*3/uL (ref 1.7–7.7)
Neutrophils Relative %: 59 %
Platelet Count: 343 10*3/uL (ref 150–400)
RBC: 4.38 MIL/uL (ref 4.22–5.81)
RDW: 14.1 % (ref 11.5–15.5)
WBC Count: 7.4 10*3/uL (ref 4.0–10.5)
nRBC: 0 % (ref 0.0–0.2)

## 2021-04-28 LAB — PREALBUMIN: Prealbumin: 22.6 mg/dL (ref 18–38)

## 2021-04-28 NOTE — Progress Notes (Signed)
Referral MD  Reason for Referral: Metastatic pancreatic cancer  Chief Complaint  Patient presents with  . New Patient (Initial Visit)  : I am here for a second opinion.  HPI: [name] is a very nice 80 year old white male.  He is visiting from [place].  He comes in with his wife.  He is incredibly interesting to talk to.  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  He moved to [place] in 2017.  He apparently was found to have a localized pancreatic cancer back in 2018.  He underwent surgery for this.  This was done at [facility].  He had adjuvant chemotherapy but really cannot tolerate this well.  Of note, there was molecular markers sent off on his initial tumor.  This apparently did not show any actionable mutations.  He initially was followed by the incredibly esteemed Kevin Villegas.  Unfortunately, there was a recurrence of his cancer.  He was treated with Xeloda.  He seemed to do quite well with Xeloda.  He had 4 cycles of Xeloda.  Because of tolerance, he has low to 10 out of 21 days.  He has been off Xeloda for a couple years.  He had done well.  Recently, he was found to have some renal insufficiency.  He had a CT scan which showed some hydronephrosis on the right.  He was referred to urology.  He subsequently underwent a cystoscopy.  Biopsies were taken as there was appear to be a tumor at the right ureteral orifice.  This was done on 04/06/2021.  The pathology report ([id number]) showed metastatic adenocarcinoma which was felt to be consistent with pancreatic origin.  He has been seen by Kevin Villegas.  She has done a very thorough job.  She has done scans.  He clearly has progressive disease.  He has pulmonary nodules.  I think his peritoneal nodules.  She went over options with him.  She felt that he would be a good candidate for systemic chemotherapy with Abraxane/gemcitabine.  He really would like to avoid IV chemotherapy.  As such, he is coming to the [place]  for a second opinion.  - Looks quite good.  He is eating well.  He is having no nausea or vomiting.  He is having no issues with pain.  He has had no cough or shortness of breath.  There is no bleeding.  There is no change in bowel or bladder habits.  I would have to say that his performance status right now is ECOG 0.  He does not smoke.  He really does not have alcoholic beverages.     Past Medical History:  Diagnosis Date  . Medical history non-contributory   . Pancreatic cancer [facility])   :  Past Surgical History:  Procedure Laterality Date  . CHOLECYSTECTOMY N/A 12/16/2014   Procedure: LAPAROSCOPIC CHOLECYSTECTOMY WITH INTRAOPERATIVE CHOLANGIOGRAM;  Surgeon: Kevin Skeans, MD;  Location: [place];  Service: General;  Laterality: N/A;  . EUS N/A 09/24/2015   Procedure: UPPER ENDOSCOPIC ULTRASOUND (EUS) LINEAR;  Surgeon: Kevin Banister, MD;  Location: [facility];  Service: Endoscopy;  Laterality: N/A;  . VASECTOMY    :   Current Outpatient Medications:  .  sildenafil (REVATIO) 20 MG tablet, Take 1-3 tablets (20-60 mg total) by mouth as needed. (Patient not taking: Reported on 11/24/2019), Disp: 30 tablet, Rfl: 1:  :  Allergies  Allergen Reactions  . Pollen Extract Other (See Comments)    Sinus congestion  :  Family History  Problem Relation Age of Onset  . Stroke Mother   :  Social History   Socioeconomic History  . Marital status: Married    Spouse name: Not on file  . Number of children: 2  . Years of education: Not on file  . Highest education level: Not on file  Occupational History  . Occupation: retired  Tobacco Use  . Smoking status: Never Smoker  . Smokeless tobacco: Never Used  Vaping Use  . Vaping Use: Never used  Substance and Sexual Activity  . Alcohol use: No    Alcohol/week: 0.0 standard drinks  . Drug use: No  . Sexual activity: Yes  Other Topics Concern  . Not on file  Social History Narrative  . Not on file   Social Determinants of  Health   Financial Resource Strain: Not on file  Food Insecurity: Not on file  Transportation Needs: Not on file  Physical Activity: Not on file  Stress: Not on file  Social Connections: Not on file  Intimate Partner Violence: Not on file  :  Review of Systems  Constitutional: Negative.   HENT: Negative.   Eyes: Negative.   Respiratory: Negative.   Cardiovascular: Negative.   Gastrointestinal: Negative.   Genitourinary: Negative.   Musculoskeletal: Negative.   Skin: Negative.   Neurological: Negative.   Endo/Heme/Allergies: Negative.   Psychiatric/Behavioral: Negative.      Exam:  This is a well-developed and well-nourished white male in no obvious distress.  Vital signs show temperature of 98.3.  Pulse 55.  Blood pressure 165/92.  Weight is 165 pounds.  Head and neck exam shows no ocular or oral lesions.  There is no scleral icterus.  There is no adenopathy in the neck.  Thyroid is not palpable.  Lungs are clear to percussion and auscultation bilaterally.  Cardiac exam regular rate and rhythm with no murmurs, rubs or bruits.  Abdomen is soft.  He has good bowel sounds.  He has a well-healed laparotomy scar.  There is no fluid wave.  There is no palpable liver or spleen tip.  Extremity shows no clubbing, cyanosis or edema.  He has good range of motion of his joints.  Neurological exam shows no focal neurological deficits.  Skin exam shows no rashes, ecchymoses or petechia.  @IPVITALS @   Recent Labs    04/28/21 1032  WBC 7.4  HGB 13.9  HCT 42.1  PLT 343   Recent Labs    04/28/21 1032  NA 136  K 4.6  CL 102  CO2 26  GLUCOSE 91  BUN 26*  CREATININE 1.71*  CALCIUM 9.8    Blood smear review: None  Pathology: See above    Assessment and Plan: Kevin Villegas is a really nice 80 year old white male.  He deftly looks younger.  His performance status is fantastic.  He has progressive pancreatic cancer.  He has not been on therapy for I think a year or more.  As such,  if he wanted to try Xeloda, I think this would be reasonable.  However, I really agree with Kevin Villegas that IV chemotherapy would be a better way to go and that he would have a better chance of responding to IV chemotherapy and prolonging his life.  He realizes that this is not a curable situation.  He just wants to be able to live longer.  I told him that with the IV protocol that Kevin Villegas recommended, he would have a better chance of extending his  life.  I would think that the Abraxane/gemcitabine protocol would not be all that difficult for him to tolerate.  I really do not see that we have do any studies with him.  The one thing I would recommend is the biopsy that was taken in April, we sent it off for molecular markers to see if there is any new genetic changes that might suggest that targeted therapy would be helpful.  He is wondering about immunotherapy.  I told him that there really is a poor track record with pancreatic cancer and using immunotherapy.  I spent a good hour with he and his wife.  They are both very very nice.  They are very interesting to talk to.  The office is gone fantastic care by the oncologists at [facility]  The fact that he had a recurrent pancreatic cancer several years ago is certainly a miracle that he is still with this.  I think this is a good testament to the biology of his cancer.  It appears to be changing now and causing his malignancy to be a little more aggressive.  I told him that we would be more than happy to see him back but that Kevin Villegas is doing a fantastic job with him.

## 2021-04-29 ENCOUNTER — Telehealth: Payer: Self-pay

## 2021-04-29 LAB — CANCER ANTIGEN 19-9: CA 19-9: 3 U/mL (ref 0–35)

## 2021-04-29 NOTE — Telephone Encounter (Signed)
No 04/28/21 LOS   Kevin Villegas

## 2021-04-30 ENCOUNTER — Other Ambulatory Visit: Payer: Medicare Other

## 2021-04-30 ENCOUNTER — Ambulatory Visit: Payer: Medicare Other | Admitting: Hematology & Oncology

## 2021-05-07 ENCOUNTER — Encounter: Payer: Self-pay | Admitting: Hematology & Oncology

## 2021-05-16 ENCOUNTER — Other Ambulatory Visit: Payer: Self-pay

## 2021-05-16 ENCOUNTER — Encounter (HOSPITAL_BASED_OUTPATIENT_CLINIC_OR_DEPARTMENT_OTHER): Payer: Self-pay | Admitting: Emergency Medicine

## 2021-05-16 ENCOUNTER — Emergency Department (HOSPITAL_BASED_OUTPATIENT_CLINIC_OR_DEPARTMENT_OTHER): Payer: Medicare Other

## 2021-05-16 ENCOUNTER — Emergency Department (HOSPITAL_BASED_OUTPATIENT_CLINIC_OR_DEPARTMENT_OTHER)
Admission: EM | Admit: 2021-05-16 | Discharge: 2021-05-16 | Disposition: A | Discharge status: Home / Self Care | Payer: Medicare Other | Source: Home / Self Care | Attending: Emergency Medicine | Admitting: Emergency Medicine

## 2021-05-16 DIAGNOSIS — Z9221 Personal history of antineoplastic chemotherapy: Secondary | ICD-10-CM | POA: Insufficient documentation

## 2021-05-16 DIAGNOSIS — Z8507 Personal history of malignant neoplasm of pancreas: Secondary | ICD-10-CM | POA: Insufficient documentation

## 2021-05-16 DIAGNOSIS — K5903 Drug induced constipation: Secondary | ICD-10-CM | POA: Insufficient documentation

## 2021-05-16 DIAGNOSIS — K56609 Unspecified intestinal obstruction, unspecified as to partial versus complete obstruction: Secondary | ICD-10-CM | POA: Diagnosis not present

## 2021-05-16 DIAGNOSIS — K5900 Constipation, unspecified: Secondary | ICD-10-CM

## 2021-05-16 DIAGNOSIS — R109 Unspecified abdominal pain: Secondary | ICD-10-CM

## 2021-05-16 DIAGNOSIS — K56699 Other intestinal obstruction unspecified as to partial versus complete obstruction: Secondary | ICD-10-CM | POA: Diagnosis not present

## 2021-05-16 MED ORDER — DICYCLOMINE HCL 20 MG PO TABS: 10.0000 mg | ORAL_TABLET | Freq: Four times a day (QID) | ORAL | 0 refills | Status: DC | PRN

## 2021-05-16 MED ORDER — SIMETHICONE 40 MG/0.6ML PO SUSP (UNIT DOSE)
40.0000 mg | Freq: Once | ORAL | Status: AC
  Administered 2021-05-16: 40 mg via ORAL
  Filled 2021-05-16: qty 0.6

## 2021-05-16 MED ORDER — DICYCLOMINE HCL 10 MG/ML IM SOLN
10.0000 mg | Freq: Once | INTRAMUSCULAR | Status: AC
  Administered 2021-05-16: 10 mg via INTRAMUSCULAR
  Filled 2021-05-16: qty 2

## 2021-05-16 NOTE — ED Triage Notes (Signed)
Pt reports issues with constipation since having chemo 5/18. Pt states he took Dulcolax yesterday at 0830. LBM was yesterday but he "feels it was inadequate" and has had abd pain since. Denies fever, nausea or vomiting. No hx of obstruction.

## 2021-05-16 NOTE — ED Notes (Signed)
Pt discharged to home. Discharge instructions have been discussed with patient and/or family members. Pt verbally acknowledges understanding d/c instructions, and endorses comprehension to checkout at registration before leaving.  °

## 2021-05-16 NOTE — ED Provider Notes (Signed)
[facility] Provider Note: Georgena Spurling, MD, FACEP  CSN: [id number] MRN: [id number] ARRIVAL: 05/16/21 at 0525 ROOM: Micro  Constipation   HISTORY OF PRESENT ILLNESS  05/16/21 5:48 AM Kevin Villegas is a 80 y.o. male who is being treated at [facility] for pancreatic cancer.  He reports being constipated since having chemotherapy on 05/05/2021.  He has taken a variety of laxatives intermittently.  He took Dulcolax yesterday morning and had a bowel movement but feels it was inadequate.  He has had abdominal pain since then.  The abdominal pain is in his lower abdomen, primarily on the right, and is intermittent.  It is worse when lying supine but not significantly worse when moving or with palpation.  He describes it as sharp.  He has had no nausea or vomiting.   Past Medical History:  Diagnosis Date  . Pancreatic cancer [facility])     Past Surgical History:  Procedure Laterality Date  . ABDOMINAL SURGERY    . CHOLECYSTECTOMY N/A 12/16/2014   Procedure: LAPAROSCOPIC CHOLECYSTECTOMY WITH INTRAOPERATIVE CHOLANGIOGRAM;  Surgeon: Georganna Skeans, MD;  Location: [place];  Service: General;  Laterality: N/A;  . EUS N/A 09/24/2015   Procedure: UPPER ENDOSCOPIC ULTRASOUND (EUS) LINEAR;  Surgeon: Milus Banister, MD;  [facility];  Service: Endoscopy;  Laterality: N/A;  . VASECTOMY      Family History  Problem Relation Age of Onset  . Stroke Mother     Social History   Tobacco Use  . Smoking status: Never Smoker  . Smokeless tobacco: Never Used  Vaping Use  . Vaping Use: Never used  Substance Use Topics  . Alcohol use: No    Alcohol/week: 0.0 standard drinks  . Drug use: No    Prior to Admission medications   Medication Sig Start Date End Date Taking? Authorizing Provider  dicyclomine (BENTYL) 20 MG tablet Take 0.5 tablets (10 mg total) by mouth every 6 (six) hours as needed (For abdominal cramping). 05/16/21  Yes Rosalina Dingwall, MD     Allergies Pollen extract   REVIEW OF SYSTEMS  Negative except as noted here or in the History of Present Illness.   PHYSICAL EXAMINATION  Initial Vital Signs Blood pressure (!) 164/105, pulse 78, temperature 98.2 F (36.8 C), temperature source Oral, resp. rate 20, height 5\' 11"  (1.803 m), weight 74.4 kg, SpO2 97 %.  Examination General: Well-developed, well-nourished male in no acute distress; appearance consistent with age of record HENT: normocephalic; atraumatic Eyes: pupils equal, round and reactive to light; extraocular muscles intact Neck: supple Heart: regular rate and rhythm Lungs: clear to auscultation bilaterally Abdomen: soft; nondistended; right lower quadrant tenderness; bowel sounds present Rectal: External hemorrhoids without bleeding or thrombosis; no stool in vault Extremities: No deformity; full range of motion; pulses normal Neurologic: Awake, alert and oriented; motor function intact in all extremities and symmetric; no facial droop Skin: Warm and dry Psychiatric: Normal mood and affect   RESULTS  Summary of this visit's results, reviewed and interpreted by myself:   EKG Interpretation  Date/Time:    Ventricular Rate:    PR Interval:    QRS Duration:   QT Interval:    QTC Calculation:   R Axis:     Text Interpretation:        Laboratory Studies: No results found for this or any previous visit (from the past 24 hour(s)). Imaging Studies: DG ABD ACUTE 2+V W 1V CHEST  Result Date: 05/16/2021 CLINICAL DATA:  Abdominal pain, constipation, history of pancreatic cancer EXAM: DG ABDOMEN ACUTE WITH 1 VIEW CHEST COMPARISON:  CT abdomen/pelvis dated 11/24/2019 FINDINGS: Nodular opacities in the bilateral upper lobes and left lower lobe, suspicious for metastases. Right chest power port terminates in the lower SVC. The heart is normal in size. Nonobstructive bowel gas pattern with gas and fluid-filled loops of colon in the upper abdomen. Cholecystectomy  clips. Surgical sutures in the left upper abdomen. Metallic stent in the left mid abdomen. Mild degenerative changes of the lumbar spine. IMPRESSION: Multiple bilateral pulmonary nodules, suspicious for metastases. Nonobstructive bowel gas pattern. Postsurgical changes in the abdomen. Electronically Signed   By: Julian Hy M.D.   On: 05/16/2021 06:51    ED COURSE and MDM  Nursing notes, initial and subsequent vitals signs, including pulse oximetry, reviewed and interpreted by myself.  Vitals:   05/16/21 0541 05/16/21 0544  BP: (!) 164/105   Pulse: 78   Resp: 20   Temp: 98.2 F (36.8 C)   TempSrc: Oral   SpO2: 97%   Weight:  74.4 kg  Height:  5\' 11"  (1.803 m)   Medications  simethicone (MYLICON) 40 ES/9.2ZR suspension 40 mg (has no administration in time range)  dicyclomine (BENTYL) injection 10 mg (has no administration in time range)   The patient's abdominal series does not show evidence of an acute obstruction.  The patient's pain is intermittent and cramping in nature.  We will trial Bentyl (half dose due to his age) and encourage regular daily use of MiraLAX instead of as needed.  He was advised if symptoms worsen or do not improve we would entertain a CT scan but wish to avoid a CT scan at this time as he has had multiple CT scans recently associated with his cancer staging.   PROCEDURES  Procedures   ED DIAGNOSES     ICD-10-CM   1. Abdominal cramping  R10.9   2. Constipation, unspecified constipation type  K59.00        Lonette Stevison, Jenny Reichmann, MD 05/16/21 [phone]

## 2021-05-18 ENCOUNTER — Inpatient Hospital Stay (HOSPITAL_BASED_OUTPATIENT_CLINIC_OR_DEPARTMENT_OTHER)
Admission: EM | Admit: 2021-05-18 | Discharge: 2021-05-26 | DRG: 389 | Disposition: A | Discharge status: Home / Self Care | Payer: Medicare Other | Attending: Internal Medicine | Admitting: Internal Medicine

## 2021-05-18 ENCOUNTER — Emergency Department (HOSPITAL_BASED_OUTPATIENT_CLINIC_OR_DEPARTMENT_OTHER): Payer: Medicare Other

## 2021-05-18 ENCOUNTER — Other Ambulatory Visit: Payer: Self-pay

## 2021-05-18 ENCOUNTER — Encounter (HOSPITAL_BASED_OUTPATIENT_CLINIC_OR_DEPARTMENT_OTHER): Payer: Self-pay | Admitting: Emergency Medicine

## 2021-05-18 DIAGNOSIS — D649 Anemia, unspecified: Secondary | ICD-10-CM | POA: Diagnosis not present

## 2021-05-18 DIAGNOSIS — K2980 Duodenitis without bleeding: Secondary | ICD-10-CM | POA: Diagnosis present

## 2021-05-18 DIAGNOSIS — E872 Acidosis: Secondary | ICD-10-CM | POA: Diagnosis present

## 2021-05-18 DIAGNOSIS — J301 Allergic rhinitis due to pollen: Secondary | ICD-10-CM | POA: Diagnosis present

## 2021-05-18 DIAGNOSIS — K56699 Other intestinal obstruction unspecified as to partial versus complete obstruction: Secondary | ICD-10-CM

## 2021-05-18 DIAGNOSIS — N132 Hydronephrosis with renal and ureteral calculous obstruction: Secondary | ICD-10-CM | POA: Diagnosis present

## 2021-05-18 DIAGNOSIS — K5730 Diverticulosis of large intestine without perforation or abscess without bleeding: Secondary | ICD-10-CM | POA: Diagnosis present

## 2021-05-18 DIAGNOSIS — N179 Acute kidney failure, unspecified: Secondary | ICD-10-CM | POA: Diagnosis present

## 2021-05-18 DIAGNOSIS — C786 Secondary malignant neoplasm of retroperitoneum and peritoneum: Secondary | ICD-10-CM | POA: Diagnosis present

## 2021-05-18 DIAGNOSIS — D75839 Thrombocytosis, unspecified: Secondary | ICD-10-CM | POA: Diagnosis present

## 2021-05-18 DIAGNOSIS — K6389 Other specified diseases of intestine: Secondary | ICD-10-CM

## 2021-05-18 DIAGNOSIS — Z20822 Contact with and (suspected) exposure to covid-19: Secondary | ICD-10-CM | POA: Diagnosis present

## 2021-05-18 DIAGNOSIS — Z9049 Acquired absence of other specified parts of digestive tract: Secondary | ICD-10-CM | POA: Diagnosis not present

## 2021-05-18 DIAGNOSIS — C259 Malignant neoplasm of pancreas, unspecified: Secondary | ICD-10-CM | POA: Diagnosis present

## 2021-05-18 DIAGNOSIS — K209 Esophagitis, unspecified without bleeding: Secondary | ICD-10-CM | POA: Diagnosis present

## 2021-05-18 DIAGNOSIS — Z9081 Acquired absence of spleen: Secondary | ICD-10-CM | POA: Diagnosis not present

## 2021-05-18 DIAGNOSIS — K648 Other hemorrhoids: Secondary | ICD-10-CM | POA: Diagnosis present

## 2021-05-18 DIAGNOSIS — K449 Diaphragmatic hernia without obstruction or gangrene: Secondary | ICD-10-CM | POA: Diagnosis present

## 2021-05-18 DIAGNOSIS — Z9852 Vasectomy status: Secondary | ICD-10-CM

## 2021-05-18 DIAGNOSIS — C7989 Secondary malignant neoplasm of other specified sites: Secondary | ICD-10-CM | POA: Diagnosis not present

## 2021-05-18 DIAGNOSIS — C784 Secondary malignant neoplasm of small intestine: Secondary | ICD-10-CM | POA: Diagnosis present

## 2021-05-18 DIAGNOSIS — K5900 Constipation, unspecified: Secondary | ICD-10-CM | POA: Diagnosis not present

## 2021-05-18 DIAGNOSIS — K56609 Unspecified intestinal obstruction, unspecified as to partial versus complete obstruction: Secondary | ICD-10-CM

## 2021-05-18 DIAGNOSIS — R918 Other nonspecific abnormal finding of lung field: Secondary | ICD-10-CM | POA: Diagnosis not present

## 2021-05-18 DIAGNOSIS — N219 Calculus of lower urinary tract, unspecified: Secondary | ICD-10-CM | POA: Diagnosis not present

## 2021-05-18 DIAGNOSIS — M799 Soft tissue disorder, unspecified: Secondary | ICD-10-CM | POA: Diagnosis not present

## 2021-05-18 DIAGNOSIS — G893 Neoplasm related pain (acute) (chronic): Secondary | ICD-10-CM | POA: Diagnosis not present

## 2021-05-18 LAB — CBC WITH DIFFERENTIAL/PLATELET
Abs Immature Granulocytes: 0.01 10*3/uL (ref 0.00–0.07)
Basophils Absolute: 0 10*3/uL (ref 0.0–0.1)
Basophils Relative: 1 %
Eosinophils Absolute: 0 10*3/uL (ref 0.0–0.5)
Eosinophils Relative: 0 %
HCT: 43.9 % (ref 39.0–52.0)
Hemoglobin: 14.9 g/dL (ref 13.0–17.0)
Immature Granulocytes: 0 %
Lymphocytes Relative: 13 %
Lymphs Abs: 0.8 10*3/uL (ref 0.7–4.0)
MCH: 32.3 pg (ref 26.0–34.0)
MCHC: 33.9 g/dL (ref 30.0–36.0)
MCV: 95.2 fL (ref 80.0–100.0)
Monocytes Absolute: 1.2 10*3/uL — ABNORMAL HIGH (ref 0.1–1.0)
Monocytes Relative: 19 %
Neutro Abs: 4.1 10*3/uL (ref 1.7–7.7)
Neutrophils Relative %: 67 %
Platelets: 514 10*3/uL — ABNORMAL HIGH (ref 150–400)
RBC: 4.61 MIL/uL (ref 4.22–5.81)
RDW: 13.5 % (ref 11.5–15.5)
WBC: 6.1 10*3/uL (ref 4.0–10.5)
nRBC: 0 % (ref 0.0–0.2)

## 2021-05-18 LAB — COMPREHENSIVE METABOLIC PANEL
ALT: 13 U/L (ref 0–44)
AST: 20 U/L (ref 15–41)
Albumin: 4 g/dL (ref 3.5–5.0)
Alkaline Phosphatase: 36 U/L — ABNORMAL LOW (ref 38–126)
Anion gap: 9 (ref 5–15)
BUN: 9 mg/dL (ref 8–23)
CO2: 21 mmol/L — ABNORMAL LOW (ref 22–32)
Calcium: 8.8 mg/dL — ABNORMAL LOW (ref 8.9–10.3)
Chloride: 105 mmol/L (ref 98–111)
Creatinine, Ser: 0.7 mg/dL (ref 0.61–1.24)
GFR, Estimated: >60 mL/min (ref >60)
Glucose, Bld: 145 mg/dL — ABNORMAL HIGH (ref 70–99)
Potassium: 3.7 mmol/L (ref 3.5–5.1)
Sodium: 135 mmol/L (ref 135–145)
Total Bilirubin: 0.7 mg/dL (ref 0.3–1.2)
Total Protein: 7 g/dL (ref 6.5–8.1)

## 2021-05-18 LAB — URINALYSIS, COMPLETE (UACMP) WITH MICROSCOPIC
Bacteria, UA: NONE SEEN
Bilirubin Urine: NEGATIVE
Glucose, UA: NEGATIVE mg/dL
Hgb urine dipstick: NEGATIVE
Ketones, ur: 5 mg/dL — AB
Leukocytes,Ua: NEGATIVE
Nitrite: NEGATIVE
Protein, ur: NEGATIVE mg/dL
Specific Gravity, Urine: 1.025 (ref 1.005–1.030)
pH: 5 (ref 5.0–8.0)

## 2021-05-18 LAB — LACTIC ACID, PLASMA: Lactic Acid, Venous: 1.2 mmol/L (ref 0.5–1.9)

## 2021-05-18 LAB — LIPASE, BLOOD: Lipase: 23 U/L (ref 11–51)

## 2021-05-18 LAB — RESP PANEL BY RT-PCR (FLU A&B, COVID) ARPGX2
Influenza A by PCR: NEGATIVE
Influenza B by PCR: NEGATIVE
SARS Coronavirus 2 by RT PCR: NEGATIVE

## 2021-05-18 MED ORDER — SODIUM CHLORIDE 0.9 % IV SOLN: Freq: Once | INTRAVENOUS | Status: AC

## 2021-05-18 MED ORDER — MORPHINE SULFATE (PF) 4 MG/ML IV SOLN
4.0000 mg | Freq: Once | INTRAVENOUS | Status: AC
  Administered 2021-05-18: 4 mg via INTRAVENOUS
  Filled 2021-05-18: qty 1

## 2021-05-18 MED ORDER — ACETAMINOPHEN 650 MG RE SUPP: 650.0000 mg | Freq: Four times a day (QID) | RECTAL | Status: DC | PRN

## 2021-05-18 MED ORDER — PIPERACILLIN-TAZOBACTAM 3.375 G IVPB
3.3750 g | Freq: Three times a day (TID) | INTRAVENOUS | Status: DC
  Administered 2021-05-18 – 2021-05-24 (×18): 3.375 g via INTRAVENOUS
  Filled 2021-05-18 (×19): qty 50

## 2021-05-18 MED ORDER — PROCHLORPERAZINE MALEATE 10 MG PO TABS: 5.0000 mg | ORAL_TABLET | Freq: Four times a day (QID) | ORAL | Status: DC | PRN

## 2021-05-18 MED ORDER — ACETAMINOPHEN 325 MG PO TABS: 650.0000 mg | ORAL_TABLET | Freq: Four times a day (QID) | ORAL | Status: DC | PRN

## 2021-05-18 MED ORDER — SODIUM CHLORIDE 0.9 % IV BOLUS
1000.0000 mL | Freq: Once | INTRAVENOUS | Status: AC
  Administered 2021-05-18: 1000 mL via INTRAVENOUS

## 2021-05-18 MED ORDER — MORPHINE SULFATE (PF) 2 MG/ML IV SOLN
2.0000 mg | INTRAVENOUS | Status: DC | PRN
Start: 2021-05-18 — End: 2021-05-26
  Administered 2021-05-18: 2 mg via INTRAVENOUS
  Filled 2021-05-18: qty 1

## 2021-05-18 MED ORDER — ALUM & MAG HYDROXIDE-SIMETH 200-200-20 MG/5ML PO SUSP
30.0000 mL | Freq: Once | ORAL | Status: AC
  Administered 2021-05-18: 30 mL via ORAL
  Filled 2021-05-18: qty 30

## 2021-05-18 MED ORDER — PIPERACILLIN-TAZOBACTAM 3.375 G IVPB 30 MIN
3.3750 g | Freq: Once | INTRAVENOUS | Status: AC
  Administered 2021-05-18: 3.375 g via INTRAVENOUS
  Filled 2021-05-18: qty 50

## 2021-05-18 MED ORDER — CHLORHEXIDINE GLUCONATE CLOTH 2 % EX PADS
6.0000 | MEDICATED_PAD | Freq: Every day | CUTANEOUS | Status: DC
  Administered 2021-05-18 – 2021-05-25 (×7): 6 via TOPICAL

## 2021-05-18 MED ORDER — ONDANSETRON HCL 4 MG/2ML IJ SOLN
4.0000 mg | Freq: Once | INTRAMUSCULAR | Status: AC
  Administered 2021-05-18: 4 mg via INTRAVENOUS
  Filled 2021-05-18: qty 2

## 2021-05-18 NOTE — ED Provider Notes (Signed)
[facility] Provider Note   CSN: [id number] Arrival date & time: 05/18/21  0116     History Chief Complaint  Patient presents with  . Abdominal Pain    Kevin Villegas is a 80 y.o. male.  80 yo M with a chief complaints of abdominal pain.  Patient states that he was constipated about a week ago and so took laxatives after which had abdominal cramping and diarrhea.  Started having now epigastric pain worse with eating or drinking.  Denies nausea or vomiting denies fevers.  Denies dark or bloody stool.  Denies urinary symptoms.  Was seen in the ED a couple days ago and had an unremarkable plain film of the abdomen and was discharged with Bentyl.  Had improvement of his cramping but now complaining of the epigastric discomfort with eating.  No exertional symptoms.  History of pancreatic cancer.  Symptoms remind him of when he had pancreatitis in the past.  Sharp and shooting pain that goes straight to the back.  The history is provided by the patient and the spouse.  Abdominal Pain Pain location:  Epigastric Pain quality: sharp and shooting   Pain radiates to:  Back Pain severity:  Severe Onset quality:  Sudden Duration:  2 hours Timing:  Intermittent Progression:  Waxing and waning Chronicity:  New Relieved by:  Nothing Worsened by:  Nothing Ineffective treatments:  None tried Associated symptoms: diarrhea   Associated symptoms: no chest pain, no chills, no fever, no shortness of breath and no vomiting        Past Medical History:  Diagnosis Date  . Pancreatic cancer [facility])     Patient Active Problem List   Diagnosis Date Noted  . Colon obstruction ([place]) 05/18/2021  . Pancreatic mass 06/27/2019  . Malignant neoplasm of tail of pancreas ([place]) 06/27/2019  . Rhinitis medicamentosa 02/26/2019    Past Surgical History:  Procedure Laterality Date  . ABDOMINAL SURGERY    . CHOLECYSTECTOMY N/A 12/16/2014   Procedure: LAPAROSCOPIC  CHOLECYSTECTOMY WITH INTRAOPERATIVE CHOLANGIOGRAM;  Surgeon: Georganna Skeans, MD;  Location: [facility];  Service: General;  Laterality: N/A;  . EUS N/A 09/24/2015   Procedure: UPPER ENDOSCOPIC ULTRASOUND (EUS) LINEAR;  Surgeon: Milus Banister, MD;  Location: [facility];  Service: Endoscopy;  Laterality: N/A;  . VASECTOMY         Family History  Problem Relation Age of Onset  . Stroke Mother     Social History   Tobacco Use  . Smoking status: Never Smoker  . Smokeless tobacco: Never Used  Vaping Use  . Vaping Use: Never used  Substance Use Topics  . Alcohol use: No    Alcohol/week: 0.0 standard drinks  . Drug use: No    Home Medications Prior to Admission medications   Medication Sig Start Date End Date Taking? Authorizing Provider  dicyclomine (BENTYL) 20 MG tablet Take 0.5 tablets (10 mg total) by mouth every 6 (six) hours as needed (For abdominal cramping). 05/16/21   Molpus, John, MD  ondansetron (ZOFRAN) 8 MG tablet Take 8 mg by mouth every 8 (eight) hours as needed. 05/04/21   [provider]  prochlorperazine (COMPAZINE) 5 MG tablet Take 5 mg by mouth every 6 (six) hours as needed. 05/04/21   [provider]    Allergies    Pollen extract  Review of Systems   Review of Systems  Constitutional: Negative for chills and fever.  HENT: Negative for congestion and facial swelling.   Eyes: Negative  for discharge and visual disturbance.  Respiratory: Negative for shortness of breath.   Cardiovascular: Negative for chest pain and palpitations.  Gastrointestinal: Positive for abdominal pain and diarrhea. Negative for vomiting.  Musculoskeletal: Negative for arthralgias and myalgias.  Skin: Negative for color change and rash.  Neurological: Negative for tremors, syncope and headaches.  Psychiatric/Behavioral: Negative for confusion and dysphoric mood.    Physical Exam Updated Vital Signs BP (!) 148/95   Pulse 71   Temp 98.1 F (36.7 C) (Oral)   Resp 14    Ht 5\' 11"  (1.803 m)   Wt 74.4 kg   SpO2 94%   BMI 22.88 kg/m   Physical Exam Vitals and nursing note reviewed.  Constitutional:      Appearance: He is well-developed.  HENT:     Head: Normocephalic and atraumatic.  Eyes:     Pupils: Pupils are equal, round, and reactive to light.  Neck:     Vascular: No JVD.  Cardiovascular:     Rate and Rhythm: Normal rate and regular rhythm.     Heart sounds: No murmur heard. No friction rub. No gallop.   Pulmonary:     Effort: No respiratory distress.     Breath sounds: No wheezing.  Abdominal:     General: There is no distension.     Tenderness: There is abdominal tenderness. There is no guarding or rebound.     Comments: Mild epigastric  Musculoskeletal:        General: Normal range of motion.     Cervical back: Normal range of motion and neck supple.  Skin:    Coloration: Skin is not pale.     Findings: No rash.  Neurological:     Mental Status: He is alert and oriented to person, place, and time.  Psychiatric:        Behavior: Behavior normal.     ED Results / Procedures / Treatments   Labs (all labs ordered are listed, but only abnormal results are displayed) Labs Reviewed  CBC WITH DIFFERENTIAL/PLATELET - Abnormal; Notable for the following components:      Result Value   Platelets 514 (*)    Monocytes Absolute 1.2 (*)    All other components within normal limits  COMPREHENSIVE METABOLIC PANEL - Abnormal; Notable for the following components:   CO2 21 (*)    Glucose, Bld 145 (*)    Calcium 8.8 (*)    Alkaline Phosphatase 36 (*)    All other components within normal limits  RESP PANEL BY RT-PCR (FLU A&B, COVID) ARPGX2  LIPASE, BLOOD  LACTIC ACID, PLASMA    EKG EKG Interpretation  Date/Time:  Tuesday May 18 2021 03:05:01 EDT Ventricular Rate:  73 PR Interval:  176 QRS Duration: 94 QT Interval:  372 QTC Calculation: 410 R Axis:   28 Text Interpretation: Sinus rhythm Consider left ventricular hypertrophy No  significant change since last tracing Confirmed by Deno Etienne [phone]) on 05/18/2021 3:24:26 AM   Radiology CT ABDOMEN PELVIS WO CONTRAST  Result Date: 05/18/2021 CLINICAL DATA:  History of pancreatic cancer status post distal pancreatectomy, worsening abdominal pain EXAM: CT ABDOMEN AND PELVIS WITHOUT CONTRAST TECHNIQUE: Multidetector CT imaging of the abdomen and pelvis was performed following the standard protocol without IV contrast. COMPARISON:  11/24/2019 FINDINGS: Unenhanced CT was performed per clinician order. Lack of IV contrast limits sensitivity and specificity, especially for evaluation of abdominal/pelvic solid viscera. Lower chest: There are numerous bibasilar pulmonary nodules which have increased in size and number  since prior study, consistent with progressive pulmonary metastases. Index nodule in the right lower lobe measures 17 x 15 mm image 3/2, previously having measured 7 x 9 mm. No effusion or pneumothorax. Dense calcification of the aortic valve. Hepatobiliary: Stable liver hypodensities likely representing cysts. Gallbladder surgically absent. No biliary duct dilation. Pancreas: The head and uncinate process of the pancreas unremarkable. Distal pancreatectomy has been performed. Spleen: The spleen is surgically absent. Adrenals/Urinary Tract: There is progressive bilateral renal cortical atrophy. There is significant right-sided obstructive uropathy related to an obstructing 8 mm distal right ureteral calculus, reference image 71/2. No left-sided calculi. The bladder is minimally distended which limits its evaluation. The adrenals are grossly normal. Stomach/Bowel: There is pneumatosis involving the cecum, which could be due to ischemia or underlying infection. There is diffuse gaseous distension of the jejunum, ileum, and colon. There is change in the caliber of the distal colon with segmental narrowing and wall thickening of the proximal sigmoid colon, reference image 69 through 73,  which could reflect postinflammatory scarring as there is diffuse diverticulosis in this region. Underlying neoplasm cannot be excluded. A metallic stent is seen at the duodenal jejunal junction left upper quadrant. Spiculated soft tissue mass within the mesentery surrounding the small bowel in this region measures 3.1 x 2.8 cm, consistent with metastatic disease and increased in size since prior study. Vascular/Lymphatic: Mild atherosclerosis of the aorta and its branches. No discrete pathologic adenopathy is identified on this unenhanced exam. Reproductive: Prostate is unremarkable. Other: No free fluid or free intraperitoneal gas. No abdominal wall hernia. Musculoskeletal: No acute or destructive bony lesions. Reconstructed images demonstrate no additional findings. IMPRESSION: 1. Progressive metastatic disease, with new and enlarging pulmonary nodules and enlarging mesenteric soft tissue mass at the duodenal jejunal junction as above. 2. Distension of the distal small bowel and colon, with change in caliber at a thick-walled segment of proximal sigmoid colon. Distal colonic obstruction cannot be excluded, and colonoscopy may be useful to exclude underlying neoplasm in this region. 3. Pneumatosis of the cecum and proximal ascending colon. Differential diagnosis would include bowel ischemia or infection. No evidence of portal venous gas. 4. Right-sided obstructive uropathy due to an 8 mm obstructing distal right ureteral calculus. Critical Value/emergent results were called by telephone at the time of interpretation on 05/18/2021 at 3:28 am to provider Roniel Halloran , who verbally acknowledged these results. Electronically Signed   By: Randa Ngo M.D.   On: 05/18/2021 03:28   DG ABD ACUTE 2+V W 1V CHEST  Result Date: 05/16/2021 CLINICAL DATA:  Abdominal pain, constipation, history of pancreatic cancer EXAM: DG ABDOMEN ACUTE WITH 1 VIEW CHEST COMPARISON:  CT abdomen/pelvis dated 11/24/2019 FINDINGS: Nodular  opacities in the bilateral upper lobes and left lower lobe, suspicious for metastases. Right chest power port terminates in the lower SVC. The heart is normal in size. Nonobstructive bowel gas pattern with gas and fluid-filled loops of colon in the upper abdomen. Cholecystectomy clips. Surgical sutures in the left upper abdomen. Metallic stent in the left mid abdomen. Mild degenerative changes of the lumbar spine. IMPRESSION: Multiple bilateral pulmonary nodules, suspicious for metastases. Nonobstructive bowel gas pattern. Postsurgical changes in the abdomen. Electronically Signed   By: Julian Hy M.D.   On: 05/16/2021 06:51    Procedures Procedures   Medications Ordered in ED Medications  sodium chloride 0.9 % bolus 1,000 mL ( Intravenous Stopped 05/18/21 0423)  ondansetron (ZOFRAN) injection 4 mg (4 mg Intravenous Given 05/18/21 0319)  morphine 4  MG/ML injection 4 mg (4 mg Intravenous Given 05/18/21 0319)  alum & mag hydroxide-simeth (MAALOX/MYLANTA) 200-200-20 MG/5ML suspension 30 mL (30 mLs Oral Given 05/18/21 0317)  piperacillin-tazobactam (ZOSYN) IVPB 3.375 g (3.375 g Intravenous New Bag/Given 05/18/21 0426)    ED Course  I have reviewed the triage vital signs and the nursing notes.  Pertinent labs & imaging results that were available during my care of the patient were reviewed by me and considered in my medical decision making (see chart for details).    MDM Rules/Calculators/A&P                          80 yo M with a cc of abdominal pain, inability to tolerate by mouth.  CT labs.  CT with concern for worsening pancreatic CA, possible distal colonic obstruction and pneumatosis.  I received a call from the radiologist and we discussed the findings. Start on abx. Add lactate to labwork.   I discussed the case with Dr. Georgette Dover, general surgery will see the patient tomorrow in the hospital.  Blood work with mild metabolic acidosis.  No other significant finding.  Will discuss with  medicine for admission.  CRITICAL CARE Performed by: Cecilio Asper   Total critical care time: 35 minutes  Critical care time was exclusive of separately billable procedures and treating other patients.  Critical care was necessary to treat or prevent imminent or life-threatening deterioration.  Critical care was time spent personally by me on the following activities: development of treatment plan with patient and/or surrogate as well as nursing, discussions with consultants, evaluation of patient's response to treatment, examination of patient, obtaining history from patient or surrogate, ordering and performing treatments and interventions, ordering and review of laboratory studies, ordering and review of radiographic studies, pulse oximetry and re-evaluation of patient's condition.  The patients results and plan were reviewed and discussed.   Any x-rays performed were independently reviewed by myself.   Differential diagnosis were considered with the presenting HPI.  Medications  sodium chloride 0.9 % bolus 1,000 mL ( Intravenous Stopped 05/18/21 0423)  ondansetron (ZOFRAN) injection 4 mg (4 mg Intravenous Given 05/18/21 0319)  morphine 4 MG/ML injection 4 mg (4 mg Intravenous Given 05/18/21 0319)  alum & mag hydroxide-simeth (MAALOX/MYLANTA) 200-200-20 MG/5ML suspension 30 mL (30 mLs Oral Given 05/18/21 0317)  piperacillin-tazobactam (ZOSYN) IVPB 3.375 g (3.375 g Intravenous New Bag/Given 05/18/21 0426)    Vitals:   05/18/21 0125 05/18/21 0131 05/18/21 0330  BP:  (!) 128/91 (!) 148/95  Pulse:  93 71  Resp:  18 14  Temp:  98.1 F (36.7 C)   TempSrc:  Oral   SpO2:  97% 94%  Weight: 74.4 kg    Height: 5\' 11"  (1.803 m)      Final diagnoses:  Colon obstruction ([facility])  Pneumatosis coli    Admission/ observation were discussed with the admitting physician, patient and/or family and they are comfortable with the plan.    Final Clinical Impression(s) / ED Diagnoses Final  diagnoses:  Colon obstruction ([place])  Pneumatosis coli    Rx / DC Orders ED Discharge Orders    None       Deno Etienne, DO 05/18/21 [id number]

## 2021-05-18 NOTE — ED Triage Notes (Signed)
Patient presents with complaints of abd pain; seen here 5/29 for same; states worse onset this am; states increased pain with drinking.

## 2021-05-18 NOTE — Consult Note (Addendum)
[facility]  Telephone:([phone] Fax:([phone]) [place]  Reason for Consultation: Metastatic pancreatic cancer  HPI: Kevin Villegas is a 80 year old male with a past medical history significant for metastatic pancreatic cancer.  He is currently being treated at [facility].  He had a second opinion with our office on 04/28/2021.  Review of his records show that he had localized pancreatic cancer diagnosed 2018 and underwent surgery at [facility] for this.  He had adjuvant chemotherapy but could not tolerate it well.  He had molecular markers sent off from his initial tumor which did not show any actionable mutations.  He developed recurrent pancreatic cancer and was treated with Xeloda x4 cycles.  Thereafter, he was off treatment for several years.  More recently, he was found to have renal insufficiency and a CT scan showed hydronephrosis on the right.  He was referred to urology and biopsies were taken at the right ureteral orifice on 04/06/2021 and pathology showed metastatic adenocarcinoma felt to be consistent with pancreatic origin.  His primary oncologist at [place] recommended treatment with Abraxane and gemcitabine, but patient wanted a second opinion and was trying to avoid IV chemotherapy.  He was seen by Dr. Marin Olp for second opinion who agreed with proceeding with systemic chemotherapy.  He initiated treatment with gemcitabine and Abraxane on 05/05/2021.  The patient presented to the emergency room with abdominal pain.  He is also been experiencing constipation.  Pain is epigastric and radiates to his back and worsens with food.  Labs notable for a platelet count of 514,000, glucose 145, calcium 8.8, alk phos 36.  CT of the abdomen/pelvis showed progressive metastatic disease with new enlarging pulmonary nodules and enlarging mesenteric soft tissue mass at the duodenal jejunal junction, distention of the distal small bowel and colon with change in  caliber at a thick-walled segment of the proximal sigmoid colon distal colonic obstruction cannot be excluded, pneumatosis of the cecum and proximal ascending colon and differential would include bowel ischemia or infection, right-sided obstructive uropathy due to an 8 mm obstructing distal right ureteral calculus.  The patient has been admitted and he has been made n.p.o. and started on Zosyn.  General surgery has been consulted.  Their note is pending at the time of this dictation.  The patient is resting bed quietly today.  Family members at the bedside.  Reports improvement in his abdominal pain since receiving pain medication.  His pain has been in the epigastric area and radiates through to his back.  He reports difficulty with constipation.  He denies nausea and vomiting.  He currently denies fevers, chills, chest pain, shortness of breath, bleeding.  Received his first dose of gemcitabine and Abraxane on 05/05/2021 and tolerated treatment well overall.  He attributed his constipation to his chemotherapy.  Medical oncology was asked see the patient make recommendations regarding his metastatic pancreatic cancer.   Past Medical History:  Diagnosis Date  . Pancreatic cancer [facility])   :  Past Surgical History:  Procedure Laterality Date  . ABDOMINAL SURGERY    . CHOLECYSTECTOMY N/A 12/16/2014   Procedure: LAPAROSCOPIC CHOLECYSTECTOMY WITH INTRAOPERATIVE CHOLANGIOGRAM;  Surgeon: Georganna Skeans, MD;  Location: [place];  Service: General;  Laterality: N/A;  . EUS N/A 09/24/2015   Procedure: UPPER ENDOSCOPIC ULTRASOUND (EUS) LINEAR;  Surgeon: Milus Banister, MD;  Location: [facility];  Service: Endoscopy;  Laterality: N/A;  . VASECTOMY    :  Current Facility-Administered Medications  Medication Dose Route  Frequency Provider Last Rate Last Admin  . acetaminophen (TYLENOL) tablet 650 mg  650 mg Oral Q6H PRN Marylyn Ishihara, Tyrone A, DO       Or  . acetaminophen (TYLENOL) suppository 650 mg  650 mg Rectal  Q6H PRN Marylyn Ishihara, Tyrone A, DO      . Chlorhexidine Gluconate Cloth 2 % PADS 6 each  6 each Topical Daily Kyle, Tyrone A, DO      . morphine 2 MG/ML injection 2 mg  2 mg Intravenous Q2H PRN Marylyn Ishihara, Tyrone A, DO   2 mg at 05/18/21 1529  . piperacillin-tazobactam (ZOSYN) IVPB 3.375 g  3.375 g Intravenous Q8H Lenis Noon, RPH 12.5 mL/hr at 05/18/21 1306 3.375 g at 05/18/21 1306  . prochlorperazine (COMPAZINE) tablet 5 mg  5 mg Oral Q6H PRN Marylyn Ishihara, Tyrone A, DO         Allergies  Allergen Reactions  . Pollen Extract Other (See Comments)    Sinus congestion  :  Family History  Problem Relation Age of Onset  . Stroke Mother   :  Social History   Socioeconomic History  . Marital status: Married    Spouse name: Not on file  . Number of children: 2  . Years of education: Not on file  . Highest education level: Not on file  Occupational History  . Occupation: retired  Tobacco Use  . Smoking status: Never Smoker  . Smokeless tobacco: Never Used  Vaping Use  . Vaping Use: Never used  Substance and Sexual Activity  . Alcohol use: No    Alcohol/week: 0.0 standard drinks  . Drug use: No  . Sexual activity: Yes  Other Topics Concern  . Not on file  Social History Narrative  . Not on file   Social Determinants of Health   Financial Resource Strain: Not on file  Food Insecurity: Not on file  Transportation Needs: Not on file  Physical Activity: Not on file  Stress: Not on file  Social Connections: Not on file  Intimate Partner Violence: Not on file  :  Review of Systems: A comprehensive 14 point review of systems was negative except as noted in the HPI.  Exam: Patient Vitals for the past 24 hrs:  BP Temp Temp src Pulse Resp SpO2 Height Weight  05/18/21 1437 140/90 98.1 F (36.7 C) Oral 76 15 93 % -- --  05/18/21 1003 (!) 148/96 97.7 F (36.5 C) Oral 75 16 96 % -- --  05/18/21 0900 (!) 145/99 98 F (36.7 C) -- 74 14 96 % -- --  05/18/21 0800 (!) 152/91 -- -- 64 14 95 % -- --   05/18/21 0700 (!) 145/90 -- -- 69 13 99 % -- --  05/18/21 0330 (!) 148/95 -- -- 71 14 94 % -- --  05/18/21 0131 (!) 128/91 98.1 F (36.7 C) Oral 93 18 97 % -- --  05/18/21 0125 -- -- -- -- -- -- 5' 11"  (1.803 m) 74.4 kg    General:  well-nourished in no acute distress.   Eyes:  no scleral icterus.   ENT:  There were no oropharyngeal lesions.    Respiratory: lungs were clear bilaterally without wheezing or crackles.   Cardiovascular:  Regular rate and rhythm, S1/S2, without murmur, rub or gallop.  There was no pedal edema.   GI: Hypoactive bowel sounds, abdomen distended, tenderness in the epigastric area.  Skin exam was without echymosis, petichae.   Neuro exam was nonfocal. Patient was alert and oriented.  Attention was good.   Language was appropriate.  Mood was normal without depression.  Speech was not pressured.  Thought content was not tangential.     Lab Results  Component Value Date   WBC 6.1 05/18/2021   HGB 14.9 05/18/2021   HCT 43.9 05/18/2021   PLT 514 (H) 05/18/2021   GLUCOSE 145 (H) 05/18/2021   CHOL 203 (H) 03/22/2017   TRIG 79 03/22/2017   HDL 90 03/22/2017   LDLCALC 97 03/22/2017   ALT 13 05/18/2021   AST 20 05/18/2021   NA 135 05/18/2021   K 3.7 05/18/2021   CL 105 05/18/2021   CREATININE 0.70 05/18/2021   BUN 9 05/18/2021   CO2 21 (L) 05/18/2021    CT ABDOMEN PELVIS WO CONTRAST  Result Date: 05/18/2021 CLINICAL DATA:  History of pancreatic cancer status post distal pancreatectomy, worsening abdominal pain EXAM: CT ABDOMEN AND PELVIS WITHOUT CONTRAST TECHNIQUE: Multidetector CT imaging of the abdomen and pelvis was performed following the standard protocol without IV contrast. COMPARISON:  11/24/2019 FINDINGS: Unenhanced CT was performed per clinician order. Lack of IV contrast limits sensitivity and specificity, especially for evaluation of abdominal/pelvic solid viscera. Lower chest: There are numerous bibasilar pulmonary nodules which have increased in  size and number since prior study, consistent with progressive pulmonary metastases. Index nodule in the right lower lobe measures 17 x 15 mm image 3/2, previously having measured 7 x 9 mm. No effusion or pneumothorax. Dense calcification of the aortic valve. Hepatobiliary: Stable liver hypodensities likely representing cysts. Gallbladder surgically absent. No biliary duct dilation. Pancreas: The head and uncinate process of the pancreas unremarkable. Distal pancreatectomy has been performed. Spleen: The spleen is surgically absent. Adrenals/Urinary Tract: There is progressive bilateral renal cortical atrophy. There is significant right-sided obstructive uropathy related to an obstructing 8 mm distal right ureteral calculus, reference image 71/2. No left-sided calculi. The bladder is minimally distended which limits its evaluation. The adrenals are grossly normal. Stomach/Bowel: There is pneumatosis involving the cecum, which could be due to ischemia or underlying infection. There is diffuse gaseous distension of the jejunum, ileum, and colon. There is change in the caliber of the distal colon with segmental narrowing and wall thickening of the proximal sigmoid colon, reference image 69 through 73, which could reflect postinflammatory scarring as there is diffuse diverticulosis in this region. Underlying neoplasm cannot be excluded. A metallic stent is seen at the duodenal jejunal junction left upper quadrant. Spiculated soft tissue mass within the mesentery surrounding the small bowel in this region measures 3.1 x 2.8 cm, consistent with metastatic disease and increased in size since prior study. Vascular/Lymphatic: Mild atherosclerosis of the aorta and its branches. No discrete pathologic adenopathy is identified on this unenhanced exam. Reproductive: Prostate is unremarkable. Other: No free fluid or free intraperitoneal gas. No abdominal wall hernia. Musculoskeletal: No acute or destructive bony lesions.  Reconstructed images demonstrate no additional findings. IMPRESSION: 1. Progressive metastatic disease, with new and enlarging pulmonary nodules and enlarging mesenteric soft tissue mass at the duodenal jejunal junction as above. 2. Distension of the distal small bowel and colon, with change in caliber at a thick-walled segment of proximal sigmoid colon. Distal colonic obstruction cannot be excluded, and colonoscopy may be useful to exclude underlying neoplasm in this region. 3. Pneumatosis of the cecum and proximal ascending colon. Differential diagnosis would include bowel ischemia or infection. No evidence of portal venous gas. 4. Right-sided obstructive uropathy due to an 8 mm obstructing distal right ureteral calculus. Critical  Value/emergent results were called by telephone at the time of interpretation on 05/18/2021 at 3:28 am to provider DAN FLOYD , who verbally acknowledged these results. Electronically Signed   By: Randa Ngo M.D.   On: 05/18/2021 03:28   DG ABD ACUTE 2+V W 1V CHEST  Result Date: 05/16/2021 CLINICAL DATA:  Abdominal pain, constipation, history of pancreatic cancer EXAM: DG ABDOMEN ACUTE WITH 1 VIEW CHEST COMPARISON:  CT abdomen/pelvis dated 11/24/2019 FINDINGS: Nodular opacities in the bilateral upper lobes and left lower lobe, suspicious for metastases. Right chest power port terminates in the lower SVC. The heart is normal in size. Nonobstructive bowel gas pattern with gas and fluid-filled loops of colon in the upper abdomen. Cholecystectomy clips. Surgical sutures in the left upper abdomen. Metallic stent in the left mid abdomen. Mild degenerative changes of the lumbar spine. IMPRESSION: Multiple bilateral pulmonary nodules, suspicious for metastases. Nonobstructive bowel gas pattern. Postsurgical changes in the abdomen. Electronically Signed   By: Julian Hy M.D.   On: 05/16/2021 06:51     CT ABDOMEN PELVIS WO CONTRAST  Result Date: 05/18/2021 CLINICAL DATA:   History of pancreatic cancer status post distal pancreatectomy, worsening abdominal pain EXAM: CT ABDOMEN AND PELVIS WITHOUT CONTRAST TECHNIQUE: Multidetector CT imaging of the abdomen and pelvis was performed following the standard protocol without IV contrast. COMPARISON:  11/24/2019 FINDINGS: Unenhanced CT was performed per clinician order. Lack of IV contrast limits sensitivity and specificity, especially for evaluation of abdominal/pelvic solid viscera. Lower chest: There are numerous bibasilar pulmonary nodules which have increased in size and number since prior study, consistent with progressive pulmonary metastases. Index nodule in the right lower lobe measures 17 x 15 mm image 3/2, previously having measured 7 x 9 mm. No effusion or pneumothorax. Dense calcification of the aortic valve. Hepatobiliary: Stable liver hypodensities likely representing cysts. Gallbladder surgically absent. No biliary duct dilation. Pancreas: The head and uncinate process of the pancreas unremarkable. Distal pancreatectomy has been performed. Spleen: The spleen is surgically absent. Adrenals/Urinary Tract: There is progressive bilateral renal cortical atrophy. There is significant right-sided obstructive uropathy related to an obstructing 8 mm distal right ureteral calculus, reference image 71/2. No left-sided calculi. The bladder is minimally distended which limits its evaluation. The adrenals are grossly normal. Stomach/Bowel: There is pneumatosis involving the cecum, which could be due to ischemia or underlying infection. There is diffuse gaseous distension of the jejunum, ileum, and colon. There is change in the caliber of the distal colon with segmental narrowing and wall thickening of the proximal sigmoid colon, reference image 69 through 73, which could reflect postinflammatory scarring as there is diffuse diverticulosis in this region. Underlying neoplasm cannot be excluded. A metallic stent is seen at the duodenal  jejunal junction left upper quadrant. Spiculated soft tissue mass within the mesentery surrounding the small bowel in this region measures 3.1 x 2.8 cm, consistent with metastatic disease and increased in size since prior study. Vascular/Lymphatic: Mild atherosclerosis of the aorta and its branches. No discrete pathologic adenopathy is identified on this unenhanced exam. Reproductive: Prostate is unremarkable. Other: No free fluid or free intraperitoneal gas. No abdominal wall hernia. Musculoskeletal: No acute or destructive bony lesions. Reconstructed images demonstrate no additional findings. IMPRESSION: 1. Progressive metastatic disease, with new and enlarging pulmonary nodules and enlarging mesenteric soft tissue mass at the duodenal jejunal junction as above. 2. Distension of the distal small bowel and colon, with change in caliber at a thick-walled segment of proximal sigmoid colon. Distal colonic obstruction  cannot be excluded, and colonoscopy may be useful to exclude underlying neoplasm in this region. 3. Pneumatosis of the cecum and proximal ascending colon. Differential diagnosis would include bowel ischemia or infection. No evidence of portal venous gas. 4. Right-sided obstructive uropathy due to an 8 mm obstructing distal right ureteral calculus. Critical Value/emergent results were called by telephone at the time of interpretation on 05/18/2021 at 3:28 am to provider DAN FLOYD , who verbally acknowledged these results. Electronically Signed   By: Randa Ngo M.D.   On: 05/18/2021 03:28   DG ABD ACUTE 2+V W 1V CHEST  Result Date: 05/16/2021 CLINICAL DATA:  Abdominal pain, constipation, history of pancreatic cancer EXAM: DG ABDOMEN ACUTE WITH 1 VIEW CHEST COMPARISON:  CT abdomen/pelvis dated 11/24/2019 FINDINGS: Nodular opacities in the bilateral upper lobes and left lower lobe, suspicious for metastases. Right chest power port terminates in the lower SVC. The heart is normal in size. Nonobstructive  bowel gas pattern with gas and fluid-filled loops of colon in the upper abdomen. Cholecystectomy clips. Surgical sutures in the left upper abdomen. Metallic stent in the left mid abdomen. Mild degenerative changes of the lumbar spine. IMPRESSION: Multiple bilateral pulmonary nodules, suspicious for metastases. Nonobstructive bowel gas pattern. Postsurgical changes in the abdomen. Electronically Signed   By: Julian Hy M.D.   On: 05/16/2021 06:51   Assessment and Plan:  1.  Metastatic pancreatic cancer 2.  Bowel obstruction 3.  Abdominal pain secondary to #2 4.  Obstructing right ureteral stone  -The patient recently started systemic chemotherapy with gemcitabine and Abraxane and tolerated the first cycle well overall with exception of constipation.  Unclear if constipation was related to chemotherapy versus bowel obstruction.  CT scan shows some evidence of disease progression, but patient has only recently started chemo. Disease progression occurred prior to chemo starting.  Recommend for him to resume chemotherapy once he is recovered from his hospitalization as an outpatient. -General surgery consulted for bowel obstruction.  Await recommendations. -Pain adequately controlled with IV morphine.  Continue. -The patient is asymptomatic from his right ureteral stone.  He will follow-up with urology as an outpatient.  Mikey Bussing, DNP, AGPCNP-BC, AOCNP  ADDENDUM:   I saw and examined Kevin Villegas this morning.  I agree with the above assessment.  I think a colonoscopy would certainly not be a bad idea to see what is going on.  I realize that surgery would be a last resort for him.  I still do not think this would be a bad idea if it came down to him needing surgery.  He has a Port-A-Cath that needs to be accessed.  If he does not be able to eat, that he might need to have parenteral nutrition.  He has only had 1 cycle of chemotherapy with gemcitabine/Abraxane.  He was due for a second  cycle I think today or tomorrow.  His labs do look all that bad.  He does have some thrombocytosis.  His hemoglobin is 13.3.  I would think that some type of invasive procedure is going to need to be done to sort out what is going on.  Certainly, a colonoscopy would be a lot easier to do and hopefully get information as to why he has the obstruction.  We will follow along and try to help out any way that we can.  I do appreciate the great care that he is getting from all the staff up on [address].  Lattie Haw, MD  Jeneen Rinks 1:5-7

## 2021-05-18 NOTE — Consult Note (Signed)
CC: LBO  Requesting provider: Dr Marylyn Ishihara  HPI: Kevin Villegas is an 80 y.o. male who is here for lower abd pain.  He is ~2 wks s/p beginning chemo for recurrent pancreatic cancer.  He developed acute lower abd pain ~2 days ago.  He took some laxatives and the pain resolved with his constipation but the pain returned last night and he presented back to the ED.  CT shows progressive metastatic disease with a thickened sigmoid colon  There was also some pneumatosis of his cecum but he had a normal lactate.    Past Medical History:  Diagnosis Date  . Pancreatic cancer [facility])     Past Surgical History:  Procedure Laterality Date  . ABDOMINAL SURGERY    . CHOLECYSTECTOMY N/A 12/16/2014   Procedure: LAPAROSCOPIC CHOLECYSTECTOMY WITH INTRAOPERATIVE CHOLANGIOGRAM;  Surgeon: Georganna Skeans, MD;  Location: [place];  Service: General;  Laterality: N/A;  . EUS N/A 09/24/2015   Procedure: UPPER ENDOSCOPIC ULTRASOUND (EUS) LINEAR;  Surgeon: Milus Banister, MD;  Location: [facility];  Service: Endoscopy;  Laterality: N/A;  . VASECTOMY      Family History  Problem Relation Age of Onset  . Stroke Mother     Social:  reports that he has never smoked. He has never used smokeless tobacco. He reports that he does not drink alcohol and does not use drugs.  Allergies:  Allergies  Allergen Reactions  . Pollen Extract Other (See Comments)    Sinus congestion    Medications: I have reviewed the patient's current medications.  Results for orders placed or performed during the hospital encounter of 05/18/21 (from the past 48 hour(s))  CBC with Differential     Status: Abnormal   Collection Time: 05/18/21  3:15 AM  Result Value Ref Range   WBC 6.1 4.0 - 10.5 K/uL   RBC 4.61 4.22 - 5.81 MIL/uL   Hemoglobin 14.9 13.0 - 17.0 g/dL   HCT 43.9 39.0 - 52.0 %   MCV 95.2 80.0 - 100.0 fL   MCH 32.3 26.0 - 34.0 pg   MCHC 33.9 30.0 - 36.0 g/dL   RDW 13.5 11.5 - 15.5 %   Platelets 514 (H) 150 - 400 K/uL   nRBC  0.0 0.0 - 0.2 %   Neutrophils Relative % 67 %   Neutro Abs 4.1 1.7 - 7.7 K/uL   Lymphocytes Relative 13 %   Lymphs Abs 0.8 0.7 - 4.0 K/uL   Monocytes Relative 19 %   Monocytes Absolute 1.2 (H) 0.1 - 1.0 K/uL   Eosinophils Relative 0 %   Eosinophils Absolute 0.0 0.0 - 0.5 K/uL   Basophils Relative 1 %   Basophils Absolute 0.0 0.0 - 0.1 K/uL   Immature Granulocytes 0 %   Abs Immature Granulocytes 0.01 0.00 - 0.07 K/uL    Comment: Performed at [facility], [address] [place], [place] [address]  Comprehensive metabolic panel     Status: Abnormal   Collection Time: 05/18/21  3:15 AM  Result Value Ref Range   Sodium 135 135 - 145 mmol/L   Potassium 3.7 3.5 - 5.1 mmol/L   Chloride 105 98 - 111 mmol/L   CO2 21 (L) 22 - 32 mmol/L   Glucose, Bld 145 (H) 70 - 99 mg/dL    Comment: Glucose reference range applies only to samples taken after fasting for at least 8 hours.   BUN 9 8 - 23 mg/dL   Creatinine, Ser 0.70 0.61 - 1.24  mg/dL   Calcium 8.8 (L) 8.9 - 10.3 mg/dL   Total Protein 7.0 6.5 - 8.1 g/dL   Albumin 4.0 3.5 - 5.0 g/dL   AST 20 15 - 41 U/L   ALT 13 0 - 44 U/L   Alkaline Phosphatase 36 (L) 38 - 126 U/L   Total Bilirubin 0.7 0.3 - 1.2 mg/dL   GFR, Estimated >60 >60 mL/min    Comment: (NOTE) Calculated using the CKD-EPI Creatinine Equation (2021)    Anion gap 9 5 - 15    Comment: Performed at [facility], [address] [place], [place] [address]  Lipase, blood     Status: None   Collection Time: 05/18/21  3:15 AM  Result Value Ref Range   Lipase 23 11 - 51 U/L    Comment: Performed at [facility], [address] [place], [place] [address]  Lactic acid, plasma     Status: None   Collection Time: 05/18/21  3:49 AM  Result Value Ref Range   Lactic Acid, Venous 1.2 0.5 - 1.9 mmol/L    Comment: Performed at [facility], [address] [place], [place] [address]  Resp Panel by RT-PCR (Flu A&B, Covid) Nasopharyngeal Swab      Status: None   Collection Time: 05/18/21  3:49 AM   Specimen: Nasopharyngeal Swab; Nasopharyngeal(NP) swabs in vial transport medium  Result Value Ref Range   SARS Coronavirus 2 by RT PCR NEGATIVE NEGATIVE    Comment: (NOTE) SARS-CoV-2 target nucleic acids are NOT DETECTED.  The SARS-CoV-2 RNA is generally detectable in upper respiratory specimens during the acute phase of infection. The lowest concentration of SARS-CoV-2 viral copies this assay can detect is 138 copies/mL. A negative result does not preclude SARS-Cov-2 infection and should not be used as the sole basis for treatment or other patient management decisions. A negative result may occur with  improper specimen collection/handling, submission of specimen other than nasopharyngeal swab, presence of viral mutation(s) within the areas targeted by this assay, and inadequate number of viral copies(<138 copies/mL). A negative result must be combined with clinical observations, patient history, and epidemiological information. The expected result is Negative.  Fact Sheet for Patients:  EntrepreneurPulse.com.au  Fact Sheet for Healthcare Providers:  IncredibleEmployment.be  This test is no t yet approved or cleared by the [place] FDA and  has been authorized for detection and/or diagnosis of SARS-CoV-2 by FDA under an Emergency Use Authorization (EUA). This EUA will remain  in effect (meaning this test can be used) for the duration of the COVID-19 declaration under Section 564(b)(1) of the Act, 21 U.S.C.section 360bbb-3(b)(1), unless the authorization is terminated  or revoked sooner.       Influenza A by PCR NEGATIVE NEGATIVE   Influenza B by PCR NEGATIVE NEGATIVE    Comment: (NOTE) The Xpert Xpress SARS-CoV-2/FLU/RSV plus assay is intended as an aid in the diagnosis of influenza from Nasopharyngeal swab specimens and should not be used as a sole basis for treatment. Nasal  washings and aspirates are unacceptable for Xpert Xpress SARS-CoV-2/FLU/RSV testing.  Fact Sheet for Patients: EntrepreneurPulse.com.au  Fact Sheet for Healthcare Providers: IncredibleEmployment.be  This test is not yet approved or cleared by the [place] FDA and has been authorized for detection and/or diagnosis of SARS-CoV-2 by FDA under an Emergency Use Authorization (EUA). This EUA will remain in effect (meaning this test can be used) for the duration of the COVID-19 declaration under Section  564(b)(1) of the Act, 21 U.S.C. section 360bbb-3(b)(1), unless the authorization is terminated or revoked.  Performed at [facility], [address] [place], [place] [address]     CT ABDOMEN PELVIS WO CONTRAST  Result Date: 05/18/2021 CLINICAL DATA:  History of pancreatic cancer status post distal pancreatectomy, worsening abdominal pain EXAM: CT ABDOMEN AND PELVIS WITHOUT CONTRAST TECHNIQUE: Multidetector CT imaging of the abdomen and pelvis was performed following the standard protocol without IV contrast. COMPARISON:  11/24/2019 FINDINGS: Unenhanced CT was performed per clinician order. Lack of IV contrast limits sensitivity and specificity, especially for evaluation of abdominal/pelvic solid viscera. Lower chest: There are numerous bibasilar pulmonary nodules which have increased in size and number since prior study, consistent with progressive pulmonary metastases. Index nodule in the right lower lobe measures 17 x 15 mm image 3/2, previously having measured 7 x 9 mm. No effusion or pneumothorax. Dense calcification of the aortic valve. Hepatobiliary: Stable liver hypodensities likely representing cysts. Gallbladder surgically absent. No biliary duct dilation. Pancreas: The head and uncinate process of the pancreas unremarkable. Distal pancreatectomy has been performed. Spleen: The spleen is surgically absent. Adrenals/Urinary Tract: There is  progressive bilateral renal cortical atrophy. There is significant right-sided obstructive uropathy related to an obstructing 8 mm distal right ureteral calculus, reference image 71/2. No left-sided calculi. The bladder is minimally distended which limits its evaluation. The adrenals are grossly normal. Stomach/Bowel: There is pneumatosis involving the cecum, which could be due to ischemia or underlying infection. There is diffuse gaseous distension of the jejunum, ileum, and colon. There is change in the caliber of the distal colon with segmental narrowing and wall thickening of the proximal sigmoid colon, reference image 69 through 73, which could reflect postinflammatory scarring as there is diffuse diverticulosis in this region. Underlying neoplasm cannot be excluded. A metallic stent is seen at the duodenal jejunal junction left upper quadrant. Spiculated soft tissue mass within the mesentery surrounding the small bowel in this region measures 3.1 x 2.8 cm, consistent with metastatic disease and increased in size since prior study. Vascular/Lymphatic: Mild atherosclerosis of the aorta and its branches. No discrete pathologic adenopathy is identified on this unenhanced exam. Reproductive: Prostate is unremarkable. Other: No free fluid or free intraperitoneal gas. No abdominal wall hernia. Musculoskeletal: No acute or destructive bony lesions. Reconstructed images demonstrate no additional findings. IMPRESSION: 1. Progressive metastatic disease, with new and enlarging pulmonary nodules and enlarging mesenteric soft tissue mass at the duodenal jejunal junction as above. 2. Distension of the distal small bowel and colon, with change in caliber at a thick-walled segment of proximal sigmoid colon. Distal colonic obstruction cannot be excluded, and colonoscopy may be useful to exclude underlying neoplasm in this region. 3. Pneumatosis of the cecum and proximal ascending colon. Differential diagnosis would include  bowel ischemia or infection. No evidence of portal venous gas. 4. Right-sided obstructive uropathy due to an 8 mm obstructing distal right ureteral calculus. Critical Value/emergent results were called by telephone at the time of interpretation on 05/18/2021 at 3:28 am to provider DAN FLOYD , who verbally acknowledged these results. Electronically Signed   By: Randa Ngo M.D.   On: 05/18/2021 03:28    ROS - all of the below systems have been reviewed with the patient and positives are indicated with bold text General: chills, fever or night sweats Eyes: blurry vision or double vision ENT: epistaxis or sore throat Allergy/Immunology: itchy/watery eyes or nasal congestion Hematologic/Lymphatic: bleeding problems, blood clots or swollen  lymph nodes Endocrine: temperature intolerance or unexpected weight changes Breast: new or changing breast lumps or nipple discharge Resp: cough, shortness of breath, or wheezing CV: chest pain or dyspnea on exertion GI: as per HPI GU: dysuria, trouble voiding, or hematuria MSK: joint pain or joint stiffness Neuro: TIA or stroke symptoms Derm: pruritus and skin lesion changes Psych: anxiety and depression  PE Blood pressure 140/90, pulse 76, temperature 98.1 F (36.7 C), temperature source Oral, resp. rate 15, height 5\' 11"  (1.803 m), weight 74.4 kg, SpO2 93 %. Constitutional: NAD; conversant; no deformities Eyes: Moist conjunctiva; no lid lag; anicteric; PERRL Neck: Trachea midline; no thyromegaly Lungs: Normal respiratory effort; no tactile fremitus CV: RRR; no palpable thrills; no pitting edema GI: Abd soft, nontender; no palpable hepatosplenomegaly MSK: Normal range of motion of extremities; no clubbing/cyanosis Psychiatric: Appropriate affect; alert and oriented x3 Lymphatic: No palpable cervical or axillary lymphadenopathy  Results for orders placed or performed during the hospital encounter of 05/18/21 (from the past 48 hour(s))  CBC with  Differential     Status: Abnormal   Collection Time: 05/18/21  3:15 AM  Result Value Ref Range   WBC 6.1 4.0 - 10.5 K/uL   RBC 4.61 4.22 - 5.81 MIL/uL   Hemoglobin 14.9 13.0 - 17.0 g/dL   HCT 43.9 39.0 - 52.0 %   MCV 95.2 80.0 - 100.0 fL   MCH 32.3 26.0 - 34.0 pg   MCHC 33.9 30.0 - 36.0 g/dL   RDW 13.5 11.5 - 15.5 %   Platelets 514 (H) 150 - 400 K/uL   nRBC 0.0 0.0 - 0.2 %   Neutrophils Relative % 67 %   Neutro Abs 4.1 1.7 - 7.7 K/uL   Lymphocytes Relative 13 %   Lymphs Abs 0.8 0.7 - 4.0 K/uL   Monocytes Relative 19 %   Monocytes Absolute 1.2 (H) 0.1 - 1.0 K/uL   Eosinophils Relative 0 %   Eosinophils Absolute 0.0 0.0 - 0.5 K/uL   Basophils Relative 1 %   Basophils Absolute 0.0 0.0 - 0.1 K/uL   Immature Granulocytes 0 %   Abs Immature Granulocytes 0.01 0.00 - 0.07 K/uL    Comment: Performed at [facility], [address] [place], [place] [address]  Comprehensive metabolic panel     Status: Abnormal   Collection Time: 05/18/21  3:15 AM  Result Value Ref Range   Sodium 135 135 - 145 mmol/L   Potassium 3.7 3.5 - 5.1 mmol/L   Chloride 105 98 - 111 mmol/L   CO2 21 (L) 22 - 32 mmol/L   Glucose, Bld 145 (H) 70 - 99 mg/dL    Comment: Glucose reference range applies only to samples taken after fasting for at least 8 hours.   BUN 9 8 - 23 mg/dL   Creatinine, Ser 0.70 0.61 - 1.24 mg/dL   Calcium 8.8 (L) 8.9 - 10.3 mg/dL   Total Protein 7.0 6.5 - 8.1 g/dL   Albumin 4.0 3.5 - 5.0 g/dL   AST 20 15 - 41 U/L   ALT 13 0 - 44 U/L   Alkaline Phosphatase 36 (L) 38 - 126 U/L   Total Bilirubin 0.7 0.3 - 1.2 mg/dL   GFR, Estimated >60 >60 mL/min    Comment: (NOTE) Calculated using the CKD-EPI Creatinine Equation (2021)    Anion gap 9 5 - 15    Comment: Performed at [facility], [address] [place], [place] [address]  Lipase,  blood     Status: None   Collection Time: 05/18/21  3:15 AM  Result Value Ref Range   Lipase 23 11 - 51 U/L    Comment: Performed at  [facility], [address] [place], [place] [address]  Lactic acid, plasma     Status: None   Collection Time: 05/18/21  3:49 AM  Result Value Ref Range   Lactic Acid, Venous 1.2 0.5 - 1.9 mmol/L    Comment: Performed at [facility], [address] [place], [place] [address]  Resp Panel by RT-PCR (Flu A&B, Covid) Nasopharyngeal Swab     Status: None   Collection Time: 05/18/21  3:49 AM   Specimen: Nasopharyngeal Swab; Nasopharyngeal(NP) swabs in vial transport medium  Result Value Ref Range   SARS Coronavirus 2 by RT PCR NEGATIVE NEGATIVE    Comment: (NOTE) SARS-CoV-2 target nucleic acids are NOT DETECTED.  The SARS-CoV-2 RNA is generally detectable in upper respiratory specimens during the acute phase of infection. The lowest concentration of SARS-CoV-2 viral copies this assay can detect is 138 copies/mL. A negative result does not preclude SARS-Cov-2 infection and should not be used as the sole basis for treatment or other patient management decisions. A negative result may occur with  improper specimen collection/handling, submission of specimen other than nasopharyngeal swab, presence of viral mutation(s) within the areas targeted by this assay, and inadequate number of viral copies(<138 copies/mL). A negative result must be combined with clinical observations, patient history, and epidemiological information. The expected result is Negative.  Fact Sheet for Patients:  EntrepreneurPulse.com.au  Fact Sheet for Healthcare Providers:  IncredibleEmployment.be  This test is no t yet approved or cleared by the [place] FDA and  has been authorized for detection and/or diagnosis of SARS-CoV-2 by FDA under an Emergency Use Authorization (EUA). This EUA will remain  in effect (meaning this test can be used) for the duration of the COVID-19 declaration under Section 564(b)(1) of the Act, 21 U.S.C.section  360bbb-3(b)(1), unless the authorization is terminated  or revoked sooner.       Influenza A by PCR NEGATIVE NEGATIVE   Influenza B by PCR NEGATIVE NEGATIVE    Comment: (NOTE) The Xpert Xpress SARS-CoV-2/FLU/RSV plus assay is intended as an aid in the diagnosis of influenza from Nasopharyngeal swab specimens and should not be used as a sole basis for treatment. Nasal washings and aspirates are unacceptable for Xpert Xpress SARS-CoV-2/FLU/RSV testing.  Fact Sheet for Patients: EntrepreneurPulse.com.au  Fact Sheet for Healthcare Providers: IncredibleEmployment.be  This test is not yet approved or cleared by the [place] FDA and has been authorized for detection and/or diagnosis of SARS-CoV-2 by FDA under an Emergency Use Authorization (EUA). This EUA will remain in effect (meaning this test can be used) for the duration of the COVID-19 declaration under Section 564(b)(1) of the Act, 21 U.S.C. section 360bbb-3(b)(1), unless the authorization is terminated or revoked.  Performed at [facility], [address] [place], [place] [address]     CT ABDOMEN PELVIS WO CONTRAST  Result Date: 05/18/2021 CLINICAL DATA:  History of pancreatic cancer status post distal pancreatectomy, worsening abdominal pain EXAM: CT ABDOMEN AND PELVIS WITHOUT CONTRAST TECHNIQUE: Multidetector CT imaging of the abdomen and pelvis was performed following the standard protocol without IV contrast. COMPARISON:  11/24/2019 FINDINGS: Unenhanced CT was performed per clinician order. Lack of IV contrast limits sensitivity and specificity, especially for evaluation of abdominal/pelvic solid viscera. Lower chest:  There are numerous bibasilar pulmonary nodules which have increased in size and number since prior study, consistent with progressive pulmonary metastases. Index nodule in the right lower lobe measures 17 x 15 mm image 3/2, previously having measured 7 x 9 mm.  No effusion or pneumothorax. Dense calcification of the aortic valve. Hepatobiliary: Stable liver hypodensities likely representing cysts. Gallbladder surgically absent. No biliary duct dilation. Pancreas: The head and uncinate process of the pancreas unremarkable. Distal pancreatectomy has been performed. Spleen: The spleen is surgically absent. Adrenals/Urinary Tract: There is progressive bilateral renal cortical atrophy. There is significant right-sided obstructive uropathy related to an obstructing 8 mm distal right ureteral calculus, reference image 71/2. No left-sided calculi. The bladder is minimally distended which limits its evaluation. The adrenals are grossly normal. Stomach/Bowel: There is pneumatosis involving the cecum, which could be due to ischemia or underlying infection. There is diffuse gaseous distension of the jejunum, ileum, and colon. There is change in the caliber of the distal colon with segmental narrowing and wall thickening of the proximal sigmoid colon, reference image 69 through 73, which could reflect postinflammatory scarring as there is diffuse diverticulosis in this region. Underlying neoplasm cannot be excluded. A metallic stent is seen at the duodenal jejunal junction left upper quadrant. Spiculated soft tissue mass within the mesentery surrounding the small bowel in this region measures 3.1 x 2.8 cm, consistent with metastatic disease and increased in size since prior study. Vascular/Lymphatic: Mild atherosclerosis of the aorta and its branches. No discrete pathologic adenopathy is identified on this unenhanced exam. Reproductive: Prostate is unremarkable. Other: No free fluid or free intraperitoneal gas. No abdominal wall hernia. Musculoskeletal: No acute or destructive bony lesions. Reconstructed images demonstrate no additional findings. IMPRESSION: 1. Progressive metastatic disease, with new and enlarging pulmonary nodules and enlarging mesenteric soft tissue mass at the  duodenal jejunal junction as above. 2. Distension of the distal small bowel and colon, with change in caliber at a thick-walled segment of proximal sigmoid colon. Distal colonic obstruction cannot be excluded, and colonoscopy may be useful to exclude underlying neoplasm in this region. 3. Pneumatosis of the cecum and proximal ascending colon. Differential diagnosis would include bowel ischemia or infection. No evidence of portal venous gas. 4. Right-sided obstructive uropathy due to an 8 mm obstructing distal right ureteral calculus. Critical Value/emergent results were called by telephone at the time of interpretation on 05/18/2021 at 3:28 am to provider DAN FLOYD , who verbally acknowledged these results. Electronically Signed   By: Randa Ngo M.D.   On: 05/18/2021 03:28     A/P: Kevin Villegas is an 80 y.o. male with concern for large bowel obstruction.  Cont bowel rest and IV abx.  Would recommend GI consult to evaluate colon further.   No RLQ tenderness to suggest impending cecal perforation.  Rosario Adie, MD  [facility]

## 2021-05-18 NOTE — H&P (Addendum)
History and Physical    Kevin Villegas FYB:[id number] DOB: 12/20/1940 DOA: 05/18/2021  PCP: Dettinger, [name], MD  Patient coming from: Home  Chief Complaint: stomach pain  HPI: Kevin Villegas is a 80 y.o. male with medical history significant of pancreatic cancer. Presenting with abdominal pain. 2 nights ago, the patient had severe sharp lower abdominal pain. It was 8/10. It came in 30-second episodes. He reports that he had a weeks worth of constipation prior to this. So he went to the ED. He was given laxatives and went home with some improvement in his symptoms. He states that late yesterday, he had a new abdominal pain. This was epigastric radiating directly to his back. It was "like someone stuck a knife in me." It worsened with food. It reminded him of having pancreatitis. So he came to the ED for help. He denies any other aggravating or alleviating factors.   ED Course: Lab work was normal. CT ab/pelvis showed bowel obstruction and pneumatosis. It also showed an obstruction right uropathy. General surgery was consulted. TRH was called for admission.   Review of Systems:  Review of systems is otherwise negative for all not mentioned in HPI.   PMHx Past Medical History:  Diagnosis Date  . Pancreatic cancer [facility])     PSHx Past Surgical History:  Procedure Laterality Date  . ABDOMINAL SURGERY    . CHOLECYSTECTOMY N/A 12/16/2014   Procedure: LAPAROSCOPIC CHOLECYSTECTOMY WITH INTRAOPERATIVE CHOLANGIOGRAM;  Surgeon: Georganna Skeans, MD;  Location: [facility];  Service: General;  Laterality: N/A;  . EUS N/A 09/24/2015   Procedure: UPPER ENDOSCOPIC ULTRASOUND (EUS) LINEAR;  Surgeon: Milus Banister, MD;  Location: [facility] END[facility];  Service: Endoscopy;  Laterality: N/A;  . VASECTOMY      SocHx  reports that he has never smoked. He has never used smokeless tobacco. He reports that he does not drink alcohol and does not use drugs.  Allergies  Allergen Reactions  . Pollen Extract Other (See  Comments)    Sinus congestion    FamHx Family History  Problem Relation Age of Onset  . Stroke Mother     Prior to Admission medications   Medication Sig Start Date End Date Taking? Authorizing Provider  dicyclomine (BENTYL) 20 MG tablet Take 0.5 tablets (10 mg total) by mouth every 6 (six) hours as needed (For abdominal cramping). 05/16/21   Molpus, John, MD  ondansetron (ZOFRAN) 8 MG tablet Take 8 mg by mouth every 8 (eight) hours as needed. 05/04/21   [provider]  prochlorperazine (COMPAZINE) 5 MG tablet Take 5 mg by mouth every 6 (six) hours as needed. 05/04/21   [provider]    Physical Exam: Vitals:   05/18/21 0700 05/18/21 0800 05/18/21 0900 05/18/21 1003  BP: (!) 145/90 (!) 152/91 (!) 145/99 (!) 148/96  Pulse: 69 64 74 75  Resp: 13 14 14 16   Temp:   98 F (36.7 C) 97.7 F (36.5 C)  TempSrc:    Oral  SpO2: 99% 95% 96% 96%  Weight:      Height:        General: 80 y.o. male resting in bed in NAD Eyes: PERRL, normal sclera ENMT: Nares patent w/o discharge, orophaynx clear, dentition normal, ears w/o discharge/lesions/ulcers Neck: Supple, trachea midline Cardiovascular: RRR, +S1, S2, no m/g/r, equal pulses throughout Respiratory: CTABL, no w/r/r, normal WOB GI: BS+, NDNT, no masses noted, no organomegaly noted MSK: No e/c/c Skin: No rashes, bruises, ulcerations noted Neuro: A&O x 3, no focal  deficits Psyc: Appropriate interaction and affect, calm/cooperative  Labs on Admission: I have personally reviewed following labs and imaging studies  CBC: Recent Labs  Lab 05/18/21 0315  WBC 6.1  NEUTROABS 4.1  HGB 14.9  HCT 43.9  MCV 95.2  PLT 092*   Basic Metabolic Panel: Recent Labs  Lab 05/18/21 0315  NA 135  K 3.7  CL 105  CO2 21*  GLUCOSE 145*  BUN 9  CREATININE 0.70  CALCIUM 8.8*   GFR: Estimated Creatinine Clearance: 78.8 mL/min (by C-G formula based on SCr of 0.7 mg/dL). Liver Function Tests: Recent Labs  Lab  05/18/21 0315  AST 20  ALT 13  ALKPHOS 36*  BILITOT 0.7  PROT 7.0  ALBUMIN 4.0   Recent Labs  Lab 05/18/21 0315  LIPASE 23   No results for input(s): AMMONIA in the last 168 hours. Coagulation Profile: No results for input(s): INR, PROTIME in the last 168 hours. Cardiac Enzymes: No results for input(s): CKTOTAL, CKMB, CKMBINDEX, TROPONINI in the last 168 hours. BNP (last 3 results) No results for input(s): PROBNP in the last 8760 hours. HbA1C: No results for input(s): HGBA1C in the last 72 hours. CBG: No results for input(s): GLUCAP in the last 168 hours. Lipid Profile: No results for input(s): CHOL, HDL, LDLCALC, TRIG, CHOLHDL, LDLDIRECT in the last 72 hours. Thyroid Function Tests: No results for input(s): TSH, T4TOTAL, FREET4, T3FREE, THYROIDAB in the last 72 hours. Anemia Panel: No results for input(s): VITAMINB12, FOLATE, FERRITIN, TIBC, IRON, RETICCTPCT in the last 72 hours. Urine analysis:    Component Value Date/Time   COLORURINE YELLOW 11/24/2019 1722   APPEARANCEUR CLEAR 11/24/2019 1722   LABSPEC 1.019 11/24/2019 1722   PHURINE 5.0 11/24/2019 1722   GLUCOSEU NEGATIVE 11/24/2019 1722   HGBUR NEGATIVE 11/24/2019 1722   BILIRUBINUR NEGATIVE 11/24/2019 1722   KETONESUR 5 (A) 11/24/2019 1722   PROTEINUR NEGATIVE 11/24/2019 1722   UROBILINOGEN 0.2 08/23/2015 1555   NITRITE NEGATIVE 11/24/2019 1722   LEUKOCYTESUR NEGATIVE 11/24/2019 1722    Radiological Exams on Admission: CT ABDOMEN PELVIS WO CONTRAST  Result Date: 05/18/2021 CLINICAL DATA:  History of pancreatic cancer status post distal pancreatectomy, worsening abdominal pain EXAM: CT ABDOMEN AND PELVIS WITHOUT CONTRAST TECHNIQUE: Multidetector CT imaging of the abdomen and pelvis was performed following the standard protocol without IV contrast. COMPARISON:  11/24/2019 FINDINGS: Unenhanced CT was performed per clinician order. Lack of IV contrast limits sensitivity and specificity, especially for evaluation  of abdominal/pelvic solid viscera. Lower chest: There are numerous bibasilar pulmonary nodules which have increased in size and number since prior study, consistent with progressive pulmonary metastases. Index nodule in the right lower lobe measures 17 x 15 mm image 3/2, previously having measured 7 x 9 mm. No effusion or pneumothorax. Dense calcification of the aortic valve. Hepatobiliary: Stable liver hypodensities likely representing cysts. Gallbladder surgically absent. No biliary duct dilation. Pancreas: The head and uncinate process of the pancreas unremarkable. Distal pancreatectomy has been performed. Spleen: The spleen is surgically absent. Adrenals/Urinary Tract: There is progressive bilateral renal cortical atrophy. There is significant right-sided obstructive uropathy related to an obstructing 8 mm distal right ureteral calculus, reference image 71/2. No left-sided calculi. The bladder is minimally distended which limits its evaluation. The adrenals are grossly normal. Stomach/Bowel: There is pneumatosis involving the cecum, which could be due to ischemia or underlying infection. There is diffuse gaseous distension of the jejunum, ileum, and colon. There is change in the caliber of the distal colon with segmental  narrowing and wall thickening of the proximal sigmoid colon, reference image 69 through 73, which could reflect postinflammatory scarring as there is diffuse diverticulosis in this region. Underlying neoplasm cannot be excluded. A metallic stent is seen at the duodenal jejunal junction left upper quadrant. Spiculated soft tissue mass within the mesentery surrounding the small bowel in this region measures 3.1 x 2.8 cm, consistent with metastatic disease and increased in size since prior study. Vascular/Lymphatic: Mild atherosclerosis of the aorta and its branches. No discrete pathologic adenopathy is identified on this unenhanced exam. Reproductive: Prostate is unremarkable. Other: No free fluid  or free intraperitoneal gas. No abdominal wall hernia. Musculoskeletal: No acute or destructive bony lesions. Reconstructed images demonstrate no additional findings. IMPRESSION: 1. Progressive metastatic disease, with new and enlarging pulmonary nodules and enlarging mesenteric soft tissue mass at the duodenal jejunal junction as above. 2. Distension of the distal small bowel and colon, with change in caliber at a thick-walled segment of proximal sigmoid colon. Distal colonic obstruction cannot be excluded, and colonoscopy may be useful to exclude underlying neoplasm in this region. 3. Pneumatosis of the cecum and proximal ascending colon. Differential diagnosis would include bowel ischemia or infection. No evidence of portal venous gas. 4. Right-sided obstructive uropathy due to an 8 mm obstructing distal right ureteral calculus. Critical Value/emergent results were called by telephone at the time of interpretation on 05/18/2021 at 3:28 am to provider DAN FLOYD , who verbally acknowledged these results. Electronically Signed   By: Randa Ngo M.D.   On: 05/18/2021 03:28    EKG: Independently reviewed. Sinus, no st elevations  Assessment/Plan Bowel obstruction Abdominal pain     - admitted to tele, inpt     - general surgery to see, appreciate assistance, will defer final plan to them     - NPO until seen by surgery     - started on zosyn in ED, can continue  Obstructing right ureter stone     - no urinary symptoms     - check UA     - spoke with urology, if UA normal and pain controlled, can follow up outpt  Hx of pancreatic cancer     - surgical intervention w/ [facility] in 2018     - primary onco care w/ [facility], but has also been seen by Dr. Marin Olp; notified through secure chat, he will see the patient here, appreciate his assistance  DVT prophylaxis: SCDs  Code Status: DNI  Family Communication: None  Consults called: EDP spoke with general surgery   Status is: Inpatient  Remains  inpatient appropriate because:Inpatient level of care appropriate due to severity of illness   Dispo: The patient is from: Home              Anticipated d/c is to: Home              Patient currently is not medically stable to d/c.   Difficult to place patient No  Time spent coordinating admission: 65 minutes  [facility]  If 7PM-7AM, please contact night-coverage [email or web address]  05/18/2021, 11:06 AM

## 2021-05-18 NOTE — Progress Notes (Signed)
Pharmacy Antibiotic Note  Kevin Villegas is a 80 y.o. male admitted on 05/18/2021 with intra-abdominal infection.  Pharmacy has been consulted for piperacillin/tazobactam dosing.  Today, 05/18/21  WBC WNL  SCr WNL, CrCl > 60 mL/min  Plan:  Piperacillin/tazobactam 3.375 g IV q8h EI  Pharmacy to sign off, please re-consult if needed.   Height: 5\' 11"  (180.3 cm) Weight: 74.4 kg (164 lb 0.4 oz) IBW/kg (Calculated) : 75.3  Temp (24hrs), Avg:97.9 F (36.6 C), Min:97.7 F (36.5 C), Max:98.1 F (36.7 C)  Recent Labs  Lab 05/18/21 0315 05/18/21 0349  WBC 6.1  --   CREATININE 0.70  --   LATICACIDVEN  --  1.2    Estimated Creatinine Clearance: 78.8 mL/min (by C-G formula based on SCr of 0.7 mg/dL).    Allergies  Allergen Reactions  . Pollen Extract Other (See Comments)    Sinus congestion    Antimicrobials this admission: Piperacillin/tazobactam 5/31 >>   Dose adjustments this admission:  Microbiology results:  Thank you for allowing pharmacy to be a part of this patient's care.  Lenis Noon, PharmD 05/18/2021 12:17 PM

## 2021-05-18 NOTE — ED Triage Notes (Signed)
Patient currently receiving chemo.

## 2021-05-19 DIAGNOSIS — K5900 Constipation, unspecified: Secondary | ICD-10-CM

## 2021-05-19 DIAGNOSIS — C259 Malignant neoplasm of pancreas, unspecified: Secondary | ICD-10-CM

## 2021-05-19 DIAGNOSIS — E872 Acidosis: Secondary | ICD-10-CM

## 2021-05-19 DIAGNOSIS — N219 Calculus of lower urinary tract, unspecified: Secondary | ICD-10-CM

## 2021-05-19 DIAGNOSIS — K56609 Unspecified intestinal obstruction, unspecified as to partial versus complete obstruction: Secondary | ICD-10-CM

## 2021-05-19 DIAGNOSIS — M799 Soft tissue disorder, unspecified: Secondary | ICD-10-CM

## 2021-05-19 DIAGNOSIS — G893 Neoplasm related pain (acute) (chronic): Secondary | ICD-10-CM

## 2021-05-19 DIAGNOSIS — C7989 Secondary malignant neoplasm of other specified sites: Secondary | ICD-10-CM

## 2021-05-19 DIAGNOSIS — K6389 Other specified diseases of intestine: Secondary | ICD-10-CM

## 2021-05-19 DIAGNOSIS — N179 Acute kidney failure, unspecified: Secondary | ICD-10-CM

## 2021-05-19 DIAGNOSIS — R918 Other nonspecific abnormal finding of lung field: Secondary | ICD-10-CM

## 2021-05-19 LAB — CBC
HCT: 40.5 % (ref 39.0–52.0)
Hemoglobin: 13.3 g/dL (ref 13.0–17.0)
MCH: 32.2 pg (ref 26.0–34.0)
MCHC: 32.8 g/dL (ref 30.0–36.0)
MCV: 98.1 fL (ref 80.0–100.0)
Platelets: 596 10*3/uL — ABNORMAL HIGH (ref 150–400)
RBC: 4.13 MIL/uL — ABNORMAL LOW (ref 4.22–5.81)
RDW: 14 % (ref 11.5–15.5)
WBC: 6.3 10*3/uL (ref 4.0–10.5)
nRBC: 0 % (ref 0.0–0.2)

## 2021-05-19 LAB — COMPREHENSIVE METABOLIC PANEL
ALT: 28 U/L (ref 0–44)
AST: 26 U/L (ref 15–41)
Albumin: 3.2 g/dL — ABNORMAL LOW (ref 3.5–5.0)
Alkaline Phosphatase: 54 U/L (ref 38–126)
Anion gap: 9 (ref 5–15)
BUN: 44 mg/dL — ABNORMAL HIGH (ref 8–23)
CO2: 19 mmol/L — ABNORMAL LOW (ref 22–32)
Calcium: 8.2 mg/dL — ABNORMAL LOW (ref 8.9–10.3)
Chloride: 107 mmol/L (ref 98–111)
Creatinine, Ser: 2 mg/dL — ABNORMAL HIGH (ref 0.61–1.24)
GFR, Estimated: 33 mL/min — ABNORMAL LOW (ref >60)
Glucose, Bld: 100 mg/dL — ABNORMAL HIGH (ref 70–99)
Potassium: 4.5 mmol/L (ref 3.5–5.1)
Sodium: 135 mmol/L (ref 135–145)
Total Bilirubin: 0.6 mg/dL (ref 0.3–1.2)
Total Protein: 6.4 g/dL — ABNORMAL LOW (ref 6.5–8.1)

## 2021-05-19 MED ORDER — LIDOCAINE-PRILOCAINE 2.5-2.5 % EX CREA
TOPICAL_CREAM | CUTANEOUS | Status: DC | PRN
  Filled 2021-05-19: qty 5

## 2021-05-19 MED ORDER — DEXTROSE-NACL 5-0.9 % IV SOLN: INTRAVENOUS | Status: DC

## 2021-05-19 MED ORDER — FLEET ENEMA 7-19 GM/118ML RE ENEM
2.0000 | ENEMA | Freq: Once | RECTAL | Status: AC
  Administered 2021-05-20: 2 via RECTAL
  Filled 2021-05-19: qty 2

## 2021-05-19 NOTE — Progress Notes (Signed)
PROGRESS NOTE    Kevin Villegas  SNK:[id number] DOB: 1941-04-15 DOA: 05/18/2021 PCP: Dettinger, [name], MD   Brief Narrative:  The patient is a 80 year old Caucasian male with a past medical history significant for but not limited to pancreatic cancer and other comorbidities who presented with abdominal pain started 2 nights prior to admission.  Patient states that he had severe sharp lower abdominal pain and described as an 8 out of 10 in severity and stated that it came in 32nd episode.  He reports that he had a weeks worth of constipation prior to this.  He went to the ED and was given laxatives and went home with some improvement in his symptoms.  He states that late yesterday he developed a new abdominal pain and this was epigastric rating to her back.  He described it like a sharp knife type sensation and states it was worsened with food.  He felt as if he had pancreatitis again so he came to the ED for help.  In the ED he had normal lab work but CT of the abdomen pelvis showed progressive metastatic disease with new and enlarging pulmonary nodules and enlarging mesenteric soft tissue mass at the duodenal jejunal junction as well as distention of the distal small bowel and colon which changes in caliber at a thick-walled segment of the proximal sigmoid colon as well as distal colonic obstruction that could not be excluded.  There was also pneumatosis of the cecum and proximal ascending colon that had a differential of bowel ischemia or infection as well as right-sided obstructive uropathy due to an 8 mm obstructing distal right ureteral calculus.  General surgery was consulted as well as gastroenterology as well as medical oncology.  The plan is for the patient to undergo an EGD and flex sigmoidoscopy in the morning  Assessment & Plan:   Active Problems:   Colon obstruction (HCC)  Metastatic Pancreatic Cancer -CT of the abdomen pelvis done and showed progressive metastatic disease with new and  enlarging pulmonary nodules as well as enlarging mesenteric soft tissue mass at the duodenal jejunal junction as well as the distal tension of the distal small bowel and colon -Medical oncology was consulted and patient had recently been started on systemic chemotherapy with gemcitabine and Abraxane and tolerated his first cycle overall with exception of constipation -Medical oncology feels that that his disease progression occurred prior to the chemotherapy starting and recommend for him to resume chemotherapy once he is recovered from his hospitalization as an outpatient -Patient had surgical intervention with [facility] in 2018 -Patient primarily seen oncology care at [facility] but has also seen Dr. Marin Olp in the past -Continue pain control with IV morphine  Bowel/colonic Obstruction Abdominal Pain Constipation -Patient was admitted to telemetry inpatient -General Surgery was consulted and appreciate further evaluation and assistance; they recommended bowel rest and IV antibiotics and recommending GI consult to evaluate the colon further -We will continue IV Zosyn -Gastroenterology evaluating and reviewed his scans which showed pneumatosis of the cecum and proximal ascending colon -GI is planning for EGD and flexible sigmoidoscopy tomorrow and recommending to Fleet enemas prior to flex sig -They feel the patient would be high risk for full colonoscopy given his pneumatosis -GI recommended clear liquid diet for now and n.p.o. at midnight  Obstructing Right Ureteral Stone -He is currently asymptomatic from his right ureteral stone and will need to follow-up with urology in outpatient setting given that Dr. Marylyn Ishihara spoke with urology and they felt that the patient  can follow-up in outpatient if his UA was normal and his pain was controlled; UA is relatively unremarkable but he does have an AKI as below -May need urology input as an inpatient if creatinine continues to worsen  AKI Metabolic  Acidosis -Patient's BUNs/creatinine went from 26/1.71 -> 9/0.70 -> 44/2.00 -Patient does have small acidosis with a CO2 of 19, anion gap of 9, chloride level of 107 -We will continue IV fluid hydration with D5 normal saline at 75 MLS per hour -Avoid further nephrotoxic medications, contrast dyes, hypotension and renally adjust medications  -Repeat CMP in a.m.  DVT prophylaxis: SCDs Code Status: Partial Code  Family Communication: No family present at bedside Disposition Plan: Pending Further Clearance by GI and General Surgery   Status is: Inpatient  Remains inpatient appropriate because:Unsafe d/c plan, IV treatments appropriate due to intensity of illness or inability to take PO and Inpatient level of care appropriate due to severity of illness   Dispo: The patient is from: Home              Anticipated d/c is to: Home              Patient currently is not medically stable to d/c.   Difficult to place patient No  Consultants:   Medical Oncology  General Surgery  Gastroenterology   Procedures: EGD and Flex Sigmoidoscopy to be done in the AM   Antimicrobials:  Anti-infectives (From admission, onward)   Start     Dose/Rate Route Frequency Ordered Stop   05/18/21 1300  piperacillin-tazobactam (ZOSYN) IVPB 3.375 g        3.375 g 12.5 mL/hr over 240 Minutes Intravenous Every 8 hours 05/18/21 1216     05/18/21 0330  piperacillin-tazobactam (ZOSYN) IVPB 3.375 g        3.375 g 100 mL/hr over 30 Minutes Intravenous  Once 05/18/21 0326 05/18/21 0452        Subjective: Seen and examined at bedside and he was complaining about some mild abdominal discomfort but then stated that he just started passing some flatus.  No nausea or vomiting.  Denies any lightheadedness or dizziness.  No other concerns or complaints at this time.   Objective: Vitals:   05/18/21 1803 05/18/21 2224 05/19/21 0656 05/19/21 1517  BP: (!) 134/92 122/78 (!) 160/91 (!) 144/86  Pulse: 81 77 70 64  Resp:  16 14 14 17   Temp: 98.4 F (36.9 C) 97.7 F (36.5 C) (!) 97.4 F (36.3 C) 97.9 F (36.6 C)  TempSrc: Oral Oral Oral Oral  SpO2: 92% 93% 93% 97%  Weight:      Height:       No intake or output data in the 24 hours ending 05/19/21 1552 Filed Weights   05/18/21 0125  Weight: 74.4 kg   Examination: Physical Exam:  Constitutional: Thin elderly Caucasian male currently in NAD and appears calm and comfortable Eyes: Lids and conjunctivae normal, sclerae anicteric  ENMT: External Ears, Nose appear normal. Grossly normal hearing. .  Neck: Appears normal, supple, no cervical masses, normal ROM, no appreciable thyromegaly; no JVD Respiratory: Diminished to auscultation bilaterally, no wheezing, rales, rhonchi or crackles. Normal respiratory effort and patient is not tachypenic. No accessory muscle use.  Unlabored breathing Cardiovascular: RRR, no murmurs / rubs / gallops. S1 and S2 auscultated.  No appreciable extremity edema Abdomen: Soft, slightly tender, mildly-distended.  Has hyperactive Bowel sounds.  GU: Deferred. Musculoskeletal: No clubbing / cyanosis of digits/nails. No joint deformity upper and lower  extremities.  Skin: No rashes, lesions, ulcers on limited skin evaluation. No induration; Warm and dry.  Neurologic: CN 2-12 grossly intact with no focal deficits. Romberg sign and cerebellar reflexes not assessed.  Psychiatric: Normal judgment and insight. Alert and oriented x 3. Normal mood and appropriate affect.   Data Reviewed: I have personally reviewed following labs and imaging studies  CBC: Recent Labs  Lab 05/18/21 0315 05/19/21 0513  WBC 6.1 6.3  NEUTROABS 4.1  --   HGB 14.9 13.3  HCT 43.9 40.5  MCV 95.2 98.1  PLT 514* 892*   Basic Metabolic Panel: Recent Labs  Lab 05/18/21 0315 05/19/21 0513  NA 135 135  K 3.7 4.5  CL 105 107  CO2 21* 19*  GLUCOSE 145* 100*  BUN 9 44*  CREATININE 0.70 2.00*  CALCIUM 8.8* 8.2*   GFR: Estimated Creatinine Clearance:  31.5 mL/min (A) (by C-G formula based on SCr of 2 mg/dL (H)). Liver Function Tests: Recent Labs  Lab 05/18/21 0315 05/19/21 0513  AST 20 26  ALT 13 28  ALKPHOS 36* 54  BILITOT 0.7 0.6  PROT 7.0 6.4*  ALBUMIN 4.0 3.2*   Recent Labs  Lab 05/18/21 0315  LIPASE 23   No results for input(s): AMMONIA in the last 168 hours. Coagulation Profile: No results for input(s): INR, PROTIME in the last 168 hours. Cardiac Enzymes: No results for input(s): CKTOTAL, CKMB, CKMBINDEX, TROPONINI in the last 168 hours. BNP (last 3 results) No results for input(s): PROBNP in the last 8760 hours. HbA1C: No results for input(s): HGBA1C in the last 72 hours. CBG: No results for input(s): GLUCAP in the last 168 hours. Lipid Profile: No results for input(s): CHOL, HDL, LDLCALC, TRIG, CHOLHDL, LDLDIRECT in the last 72 hours. Thyroid Function Tests: No results for input(s): TSH, T4TOTAL, FREET4, T3FREE, THYROIDAB in the last 72 hours. Anemia Panel: No results for input(s): VITAMINB12, FOLATE, FERRITIN, TIBC, IRON, RETICCTPCT in the last 72 hours. Sepsis Labs: Recent Labs  Lab 05/18/21 0349  LATICACIDVEN 1.2    Recent Results (from the past 240 hour(s))  Resp Panel by RT-PCR (Flu A&B, Covid) Nasopharyngeal Swab     Status: None   Collection Time: 05/18/21  3:49 AM   Specimen: Nasopharyngeal Swab; Nasopharyngeal(NP) swabs in vial transport medium  Result Value Ref Range Status   SARS Coronavirus 2 by RT PCR NEGATIVE NEGATIVE Final    Comment: (NOTE) SARS-CoV-2 target nucleic acids are NOT DETECTED.  The SARS-CoV-2 RNA is generally detectable in upper respiratory specimens during the acute phase of infection. The lowest concentration of SARS-CoV-2 viral copies this assay can detect is 138 copies/mL. A negative result does not preclude SARS-Cov-2 infection and should not be used as the sole basis for treatment or other patient management decisions. A negative result may occur with  improper  specimen collection/handling, submission of specimen other than nasopharyngeal swab, presence of viral mutation(s) within the areas targeted by this assay, and inadequate number of viral copies(<138 copies/mL). A negative result must be combined with clinical observations, patient history, and epidemiological information. The expected result is Negative.  Fact Sheet for Patients:  EntrepreneurPulse.com.au  Fact Sheet for Healthcare Providers:  IncredibleEmployment.be  This test is no t yet approved or cleared by the [place] FDA and  has been authorized for detection and/or diagnosis of SARS-CoV-2 by FDA under an Emergency Use Authorization (EUA). This EUA will remain  in effect (meaning this test can be used) for the duration of the COVID-19 declaration  under Section 564(b)(1) of the Act, 21 U.S.C.section 360bbb-3(b)(1), unless the authorization is terminated  or revoked sooner.       Influenza A by PCR NEGATIVE NEGATIVE Final   Influenza B by PCR NEGATIVE NEGATIVE Final    Comment: (NOTE) The Xpert Xpress SARS-CoV-2/FLU/RSV plus assay is intended as an aid in the diagnosis of influenza from Nasopharyngeal swab specimens and should not be used as a sole basis for treatment. Nasal washings and aspirates are unacceptable for Xpert Xpress SARS-CoV-2/FLU/RSV testing.  Fact Sheet for Patients: EntrepreneurPulse.com.au  Fact Sheet for Healthcare Providers: IncredibleEmployment.be  This test is not yet approved or cleared by the [place] FDA and has been authorized for detection and/or diagnosis of SARS-CoV-2 by FDA under an Emergency Use Authorization (EUA). This EUA will remain in effect (meaning this test can be used) for the duration of the COVID-19 declaration under Section 564(b)(1) of the Act, 21 U.S.C. section 360bbb-3(b)(1), unless the authorization is terminated or revoked.  Performed at  [facility], [address] [place], [place] [address]     RN Pressure Injury Documentation:     Estimated body mass index is 22.88 kg/m as calculated from the following:   Height as of this encounter: 5\' 11"  (1.803 m).   Weight as of this encounter: 74.4 kg.  Malnutrition Type:   Malnutrition Characteristics:   Nutrition Interventions:   Radiology Studies: CT ABDOMEN PELVIS WO CONTRAST  Result Date: 05/18/2021 CLINICAL DATA:  History of pancreatic cancer status post distal pancreatectomy, worsening abdominal pain EXAM: CT ABDOMEN AND PELVIS WITHOUT CONTRAST TECHNIQUE: Multidetector CT imaging of the abdomen and pelvis was performed following the standard protocol without IV contrast. COMPARISON:  11/24/2019 FINDINGS: Unenhanced CT was performed per clinician order. Lack of IV contrast limits sensitivity and specificity, especially for evaluation of abdominal/pelvic solid viscera. Lower chest: There are numerous bibasilar pulmonary nodules which have increased in size and number since prior study, consistent with progressive pulmonary metastases. Index nodule in the right lower lobe measures 17 x 15 mm image 3/2, previously having measured 7 x 9 mm. No effusion or pneumothorax. Dense calcification of the aortic valve. Hepatobiliary: Stable liver hypodensities likely representing cysts. Gallbladder surgically absent. No biliary duct dilation. Pancreas: The head and uncinate process of the pancreas unremarkable. Distal pancreatectomy has been performed. Spleen: The spleen is surgically absent. Adrenals/Urinary Tract: There is progressive bilateral renal cortical atrophy. There is significant right-sided obstructive uropathy related to an obstructing 8 mm distal right ureteral calculus, reference image 71/2. No left-sided calculi. The bladder is minimally distended which limits its evaluation. The adrenals are grossly normal. Stomach/Bowel: There is pneumatosis involving the cecum,  which could be due to ischemia or underlying infection. There is diffuse gaseous distension of the jejunum, ileum, and colon. There is change in the caliber of the distal colon with segmental narrowing and wall thickening of the proximal sigmoid colon, reference image 69 through 73, which could reflect postinflammatory scarring as there is diffuse diverticulosis in this region. Underlying neoplasm cannot be excluded. A metallic stent is seen at the duodenal jejunal junction left upper quadrant. Spiculated soft tissue mass within the mesentery surrounding the small bowel in this region measures 3.1 x 2.8 cm, consistent with metastatic disease and increased in size since prior study. Vascular/Lymphatic: Mild atherosclerosis of the aorta and its branches. No discrete pathologic adenopathy is identified on this unenhanced exam. Reproductive: Prostate is unremarkable. Other: No free fluid or free intraperitoneal gas. No abdominal wall  hernia. Musculoskeletal: No acute or destructive bony lesions. Reconstructed images demonstrate no additional findings. IMPRESSION: 1. Progressive metastatic disease, with new and enlarging pulmonary nodules and enlarging mesenteric soft tissue mass at the duodenal jejunal junction as above. 2. Distension of the distal small bowel and colon, with change in caliber at a thick-walled segment of proximal sigmoid colon. Distal colonic obstruction cannot be excluded, and colonoscopy may be useful to exclude underlying neoplasm in this region. 3. Pneumatosis of the cecum and proximal ascending colon. Differential diagnosis would include bowel ischemia or infection. No evidence of portal venous gas. 4. Right-sided obstructive uropathy due to an 8 mm obstructing distal right ureteral calculus. Critical Value/emergent results were called by telephone at the time of interpretation on 05/18/2021 at 3:28 am to provider DAN FLOYD , who verbally acknowledged these results. Electronically Signed   By:  Randa Ngo M.D.   On: 05/18/2021 03:28   Scheduled Meds: . Chlorhexidine Gluconate Cloth  6 each Topical Daily  . [START ON 05/20/2021] sodium phosphate  2 enema Rectal Once   Continuous Infusions: . dextrose 5 % and 0.9% NaCl 75 mL/hr at 05/19/21 1133  . piperacillin-tazobactam (ZOSYN)  IV 3.375 g (05/19/21 1525)    LOS: 1 day   Kerney Elbe, DO [facility] PAGER is on [place]  If 7PM-7AM, please contact night-coverage [email or web address]

## 2021-05-19 NOTE — Progress Notes (Signed)
[facility] Progress Note     Subjective: CC-  Frustrated that he does not have a plan yet.  Feels about the same as yesterday. Continues to have mostly epigastric pain. Denies n/v. Passing very small amount flatus, no BM.   Objective: Vital signs in last 24 hours: Temp:  [97.4 F (36.3 C)-98.4 F (36.9 C)] 97.4 F (36.3 C) (06/01 0656) Pulse Rate:  [70-81] 70 (06/01 0656) Resp:  [14-16] 14 (06/01 0656) BP: (122-160)/(78-99) 160/91 (06/01 0656) SpO2:  [92 %-96 %] 93 % (06/01 0656)    Intake/Output from previous day: 05/31 0701 - 06/01 0700 In: 42.5 [IV Piggyback:42.5] Out: -  Intake/Output this shift: No intake/output data recorded.  PE: Gen:  Alert, NAD, pleasant Pulm: rate and effort normal Abd: soft, minimal distension, mild subjective epigastric TTP without rebound or guarding, +BS Skin: no rashes noted, warm and dry  Lab Results:  Recent Labs    05/18/21 0315 05/19/21 0513  WBC 6.1 6.3  HGB 14.9 13.3  HCT 43.9 40.5  PLT 514* 596*   BMET Recent Labs    05/18/21 0315 05/19/21 0513  NA 135 135  K 3.7 4.5  CL 105 107  CO2 21* 19*  GLUCOSE 145* 100*  BUN 9 44*  CREATININE 0.70 2.00*  CALCIUM 8.8* 8.2*   PT/INR No results for input(s): LABPROT, INR in the last 72 hours. CMP     Component Value Date/Time   NA 135 05/19/2021 0513   NA 141 03/22/2017 1101   K 4.5 05/19/2021 0513   CL 107 05/19/2021 0513   CO2 19 (L) 05/19/2021 0513   GLUCOSE 100 (H) 05/19/2021 0513   BUN 44 (H) 05/19/2021 0513   BUN 17 03/22/2017 1101   CREATININE 2.00 (H) 05/19/2021 0513   CREATININE 1.71 (H) 04/28/2021 1032   CALCIUM 8.2 (L) 05/19/2021 0513   PROT 6.4 (L) 05/19/2021 0513   PROT 6.9 03/22/2017 1101   ALBUMIN 3.2 (L) 05/19/2021 0513   ALBUMIN 4.5 03/22/2017 1101   AST 26 05/19/2021 0513   AST 27 04/28/2021 1032   ALT 28 05/19/2021 0513   ALT 18 04/28/2021 1032   ALKPHOS 54 05/19/2021 0513   BILITOT 0.6 05/19/2021 0513   BILITOT 0.5  04/28/2021 1032   GFRNONAA 33 (L) 05/19/2021 0513   GFRNONAA 40 (L) 04/28/2021 1032   GFRAA >60 11/24/2019 1600   Lipase     Component Value Date/Time   LIPASE 23 05/18/2021 0315       Studies/Results: CT ABDOMEN PELVIS WO CONTRAST  Result Date: 05/18/2021 CLINICAL DATA:  History of pancreatic cancer status post distal pancreatectomy, worsening abdominal pain EXAM: CT ABDOMEN AND PELVIS WITHOUT CONTRAST TECHNIQUE: Multidetector CT imaging of the abdomen and pelvis was performed following the standard protocol without IV contrast. COMPARISON:  11/24/2019 FINDINGS: Unenhanced CT was performed per clinician order. Lack of IV contrast limits sensitivity and specificity, especially for evaluation of abdominal/pelvic solid viscera. Lower chest: There are numerous bibasilar pulmonary nodules which have increased in size and number since prior study, consistent with progressive pulmonary metastases. Index nodule in the right lower lobe measures 17 x 15 mm image 3/2, previously having measured 7 x 9 mm. No effusion or pneumothorax. Dense calcification of the aortic valve. Hepatobiliary: Stable liver hypodensities likely representing cysts. Gallbladder surgically absent. No biliary duct dilation. Pancreas: The head and uncinate process of the pancreas unremarkable. Distal pancreatectomy has been performed. Spleen: The spleen is surgically absent. Adrenals/Urinary Tract: There is progressive bilateral  renal cortical atrophy. There is significant right-sided obstructive uropathy related to an obstructing 8 mm distal right ureteral calculus, reference image 71/2. No left-sided calculi. The bladder is minimally distended which limits its evaluation. The adrenals are grossly normal. Stomach/Bowel: There is pneumatosis involving the cecum, which could be due to ischemia or underlying infection. There is diffuse gaseous distension of the jejunum, ileum, and colon. There is change in the caliber of the distal colon  with segmental narrowing and wall thickening of the proximal sigmoid colon, reference image 69 through 73, which could reflect postinflammatory scarring as there is diffuse diverticulosis in this region. Underlying neoplasm cannot be excluded. A metallic stent is seen at the duodenal jejunal junction left upper quadrant. Spiculated soft tissue mass within the mesentery surrounding the small bowel in this region measures 3.1 x 2.8 cm, consistent with metastatic disease and increased in size since prior study. Vascular/Lymphatic: Mild atherosclerosis of the aorta and its branches. No discrete pathologic adenopathy is identified on this unenhanced exam. Reproductive: Prostate is unremarkable. Other: No free fluid or free intraperitoneal gas. No abdominal wall hernia. Musculoskeletal: No acute or destructive bony lesions. Reconstructed images demonstrate no additional findings. IMPRESSION: 1. Progressive metastatic disease, with new and enlarging pulmonary nodules and enlarging mesenteric soft tissue mass at the duodenal jejunal junction as above. 2. Distension of the distal small bowel and colon, with change in caliber at a thick-walled segment of proximal sigmoid colon. Distal colonic obstruction cannot be excluded, and colonoscopy may be useful to exclude underlying neoplasm in this region. 3. Pneumatosis of the cecum and proximal ascending colon. Differential diagnosis would include bowel ischemia or infection. No evidence of portal venous gas. 4. Right-sided obstructive uropathy due to an 8 mm obstructing distal right ureteral calculus. Critical Value/emergent results were called by telephone at the time of interpretation on 05/18/2021 at 3:28 am to provider DAN FLOYD , who verbally acknowledged these results. Electronically Signed   By: Randa Ngo M.D.   On: 05/18/2021 03:28    Anti-infectives: Anti-infectives (From admission, onward)   Start     Dose/Rate Route Frequency Ordered Stop   05/18/21 1300   piperacillin-tazobactam (ZOSYN) IVPB 3.375 g        3.375 g 12.5 mL/hr over 240 Minutes Intravenous Every 8 hours 05/18/21 1216     05/18/21 0330  piperacillin-tazobactam (ZOSYN) IVPB 3.375 g        3.375 g 100 mL/hr over 30 Minutes Intravenous  Once 05/18/21 0326 05/18/21 0452       Assessment/Plan Abdominal pain, constipation Concern for large bowel obstruction - CT scan shows distension of the distal small bowel and colon with change in caliber at a thick-walled segment of proximal sigmoid colon, distal colonic obstruction cannot be excluded - Recommend GI consult for possible endoscopic evaluation of the colon. Recommend keeping NPO until seen by GI.  Hx metastatic pancreatic cancer  - s/p distal pancreatectomy, splenectomy, and partial gastrectomy in 10/2017  - CT scan shows progressive metastatic disease with new/enlarging pulmonary nodules and enlarging mesenteric soft tissue mass at the duodenal jejunal junction  - recently started chemo for recurrent cancer, Dr. Marin Olp following  ID - zosyn 5/31>> FEN - IVF, NPO VTE - SCDs, per primary/ ok for chemical DVT prophylaxis from surgical standpoint Foley - none Follow up - TBD   LOS: 1 day    Wellington Hampshire, [facility] 05/19/2021, 8:16 AM Please see Amion for pager number during day hours 7:00am-4:30pm

## 2021-05-19 NOTE — Consult Note (Signed)
Referring Provider: Dr. Raiford Noble Primary Care Physician:  Dettinger, [name], MD Primary Gastroenterologist:  Althia Forts  Reason for Consultation:  Large bowel obstruction  HPI: Jeramie Scogin is a 80 y.o. male with past medical history of metastatic pancreatic adenocarcinoma s/p distal pancreatectomy, splenectomy, partial gastrectomy, and chemotherapy, duodenal stenosis s/p stenting in 11/2019 presenting for consultation of suspected large bowel obstruction.  Patient presented to the ED yesterday due to severe, sharp lower abdominal pain, as well as epigastric pain which seemed to radiate to the back.  He reports constipation since 5/17 and had some improvement on laxatives.  Prior to that, he states he typically had looser stools.  He has not had a BM for several days.  Denies any melena or hematochezia.  Denies nausea/vomiting but has not been eating much for the past week. Denies unexplained weight loss, GERD, or dysphagia.  CT abdomen without contrast 5/31: Distension of the distal small bowel and colon, with change in caliber at a thick-walled segment of proximal sigmoid colon. Distal colonic obstruction cannot be excluded, and colonoscopy may be useful to exclude underlying neoplasm in this region.  Pneumatosis of the cecum and proximal ascending colon. Differential diagnosis would include bowel ischemia or infection. No evidence of portal venous gas. A metallic stent is seen at the duodenal jejunal junction left upper quadrant. Spiculated soft tissue mass within the mesentery surrounding the small bowel in this region measures 3.1 x 2.8 cm, consistent with metastatic disease and increased in size since prior Study.  Last EGD was 11/2019 due to high grade narrowing of distal duodenum secondary to recurrent metastatic disease.  He underwent duodenal stent placement.  LA Grade B esophagitis was noted as well.  He has never had a colonoscopy.  No family history of colon cancer.   Past  Medical History:  Diagnosis Date  . Pancreatic cancer [facility])     Past Surgical History:  Procedure Laterality Date  . ABDOMINAL SURGERY    . CHOLECYSTECTOMY N/A 12/16/2014   Procedure: LAPAROSCOPIC CHOLECYSTECTOMY WITH INTRAOPERATIVE CHOLANGIOGRAM;  Surgeon: Georganna Skeans, MD;  Location: [place];  Service: General;  Laterality: N/A;  . EUS N/A 09/24/2015   Procedure: UPPER ENDOSCOPIC ULTRASOUND (EUS) LINEAR;  Surgeon: Milus Banister, MD;  Location: [facility];  Service: Endoscopy;  Laterality: N/A;  . VASECTOMY      Prior to Admission medications   Medication Sig Start Date End Date Taking? Authorizing Provider  dicyclomine (BENTYL) 20 MG tablet Take 0.5 tablets (10 mg total) by mouth every 6 (six) hours as needed (For abdominal cramping). 05/16/21  Yes Molpus, John, MD  lidocaine-prilocaine (EMLA) cream Apply 1 application topically as needed. Port access 05/04/21  Yes [provider]  ondansetron (ZOFRAN) 8 MG tablet Take 8 mg by mouth every 8 (eight) hours as needed for nausea or vomiting. 05/04/21  Yes [provider]  prochlorperazine (COMPAZINE) 5 MG tablet Take 5 mg by mouth every 6 (six) hours as needed for nausea or vomiting. 05/04/21  Yes [provider]    Scheduled Meds: . Chlorhexidine Gluconate Cloth  6 each Topical Daily   Continuous Infusions: . dextrose 5 % and 0.9% NaCl    . piperacillin-tazobactam (ZOSYN)  IV 3.375 g (05/19/21 0615)   PRN Meds:.acetaminophen **OR** acetaminophen, lidocaine-prilocaine, morphine injection, prochlorperazine  Allergies as of 05/18/2021 - Review Complete 05/18/2021  Allergen Reaction Noted  . Pollen extract Other (See Comments) 09/19/2017    Family History  Problem Relation Age of Onset  . Stroke  Mother     Social History   Socioeconomic History  . Marital status: Married    Spouse name: Not on file  . Number of children: 2  . Years of education: Not on file  . Highest education level: Not on file   Occupational History  . Occupation: retired  Tobacco Use  . Smoking status: Never Smoker  . Smokeless tobacco: Never Used  Vaping Use  . Vaping Use: Never used  Substance and Sexual Activity  . Alcohol use: No    Alcohol/week: 0.0 standard drinks  . Drug use: No  . Sexual activity: Yes  Other Topics Concern  . Not on file  Social History Narrative  . Not on file   Social Determinants of Health   Financial Resource Strain: Not on file  Food Insecurity: Not on file  Transportation Needs: Not on file  Physical Activity: Not on file  Stress: Not on file  Social Connections: Not on file  Intimate Partner Violence: Not on file    Review of Systems: Review of Systems  Constitutional: Negative for chills and fever.  HENT: Negative for hearing loss and tinnitus.   Eyes: Negative for pain and redness.  Respiratory: Negative for cough and shortness of breath.   Cardiovascular: Negative for chest pain and palpitations.  Gastrointestinal: Positive for abdominal pain and constipation. Negative for blood in stool, diarrhea, heartburn, melena, nausea and vomiting.  Genitourinary: Negative for flank pain and hematuria.  Musculoskeletal: Negative for falls and joint pain.  Skin: Negative for itching and rash.  Neurological: Negative for seizures and loss of consciousness.  Endo/Heme/Allergies: Negative for polydipsia. Does not bruise/bleed easily.  Psychiatric/Behavioral: Negative for substance abuse. The patient is not nervous/anxious.      Physical Exam: Vital signs: Vitals:   05/18/21 2224 05/19/21 0656  BP: 122/78 (!) 160/91  Pulse: 77 70  Resp: 14 14  Temp: 97.7 F (36.5 C) (!) 97.4 F (36.3 C)  SpO2: 93% 93%   Last BM Date: 05/17/21  Physical Exam Vitals reviewed.  Constitutional:      General: He is not in acute distress. HENT:     Head: Normocephalic and atraumatic.     Nose: Nose normal. No congestion.     Mouth/Throat:     Mouth: Mucous membranes are moist.      Pharynx: Oropharynx is clear.  Eyes:     General: No scleral icterus.    Extraocular Movements: Extraocular movements intact.     Conjunctiva/sclera: Conjunctivae normal.  Cardiovascular:     Rate and Rhythm: Normal rate and regular rhythm.  Pulmonary:     Effort: Pulmonary effort is normal. No respiratory distress.  Abdominal:     General: Bowel sounds are normal. There is distension.     Palpations: Abdomen is soft. There is no mass.     Tenderness: There is no abdominal tenderness. There is no guarding or rebound.     Hernia: No hernia is present.  Musculoskeletal:        General: No swelling or tenderness.     Cervical back: Normal range of motion and neck supple.  Skin:    General: Skin is warm and dry.  Neurological:     General: No focal deficit present.     Mental Status: He is alert and oriented to person, place, and time.  Psychiatric:        Mood and Affect: Mood normal.        Behavior: Behavior normal.  GI:  Lab Results: Recent Labs    05/18/21 0315 05/19/21 0513  WBC 6.1 6.3  HGB 14.9 13.3  HCT 43.9 40.5  PLT 514* 596*   BMET Recent Labs    05/18/21 0315 05/19/21 0513  NA 135 135  K 3.7 4.5  CL 105 107  CO2 21* 19*  GLUCOSE 145* 100*  BUN 9 44*  CREATININE 0.70 2.00*  CALCIUM 8.8* 8.2*   LFT Recent Labs    05/19/21 0513  PROT 6.4*  ALBUMIN 3.2*  AST 26  ALT 28  ALKPHOS 54  BILITOT 0.6   PT/INR No results for input(s): LABPROT, INR in the last 72 hours.   Studies/Results: CT ABDOMEN PELVIS WO CONTRAST  Result Date: 05/18/2021 CLINICAL DATA:  History of pancreatic cancer status post distal pancreatectomy, worsening abdominal pain EXAM: CT ABDOMEN AND PELVIS WITHOUT CONTRAST TECHNIQUE: Multidetector CT imaging of the abdomen and pelvis was performed following the standard protocol without IV contrast. COMPARISON:  11/24/2019 FINDINGS: Unenhanced CT was performed per clinician order. Lack of IV contrast limits sensitivity and  specificity, especially for evaluation of abdominal/pelvic solid viscera. Lower chest: There are numerous bibasilar pulmonary nodules which have increased in size and number since prior study, consistent with progressive pulmonary metastases. Index nodule in the right lower lobe measures 17 x 15 mm image 3/2, previously having measured 7 x 9 mm. No effusion or pneumothorax. Dense calcification of the aortic valve. Hepatobiliary: Stable liver hypodensities likely representing cysts. Gallbladder surgically absent. No biliary duct dilation. Pancreas: The head and uncinate process of the pancreas unremarkable. Distal pancreatectomy has been performed. Spleen: The spleen is surgically absent. Adrenals/Urinary Tract: There is progressive bilateral renal cortical atrophy. There is significant right-sided obstructive uropathy related to an obstructing 8 mm distal right ureteral calculus, reference image 71/2. No left-sided calculi. The bladder is minimally distended which limits its evaluation. The adrenals are grossly normal. Stomach/Bowel: There is pneumatosis involving the cecum, which could be due to ischemia or underlying infection. There is diffuse gaseous distension of the jejunum, ileum, and colon. There is change in the caliber of the distal colon with segmental narrowing and wall thickening of the proximal sigmoid colon, reference image 69 through 73, which could reflect postinflammatory scarring as there is diffuse diverticulosis in this region. Underlying neoplasm cannot be excluded. A metallic stent is seen at the duodenal jejunal junction left upper quadrant. Spiculated soft tissue mass within the mesentery surrounding the small bowel in this region measures 3.1 x 2.8 cm, consistent with metastatic disease and increased in size since prior study. Vascular/Lymphatic: Mild atherosclerosis of the aorta and its branches. No discrete pathologic adenopathy is identified on this unenhanced exam. Reproductive:  Prostate is unremarkable. Other: No free fluid or free intraperitoneal gas. No abdominal wall hernia. Musculoskeletal: No acute or destructive bony lesions. Reconstructed images demonstrate no additional findings. IMPRESSION: 1. Progressive metastatic disease, with new and enlarging pulmonary nodules and enlarging mesenteric soft tissue mass at the duodenal jejunal junction as above. 2. Distension of the distal small bowel and colon, with change in caliber at a thick-walled segment of proximal sigmoid colon. Distal colonic obstruction cannot be excluded, and colonoscopy may be useful to exclude underlying neoplasm in this region. 3. Pneumatosis of the cecum and proximal ascending colon. Differential diagnosis would include bowel ischemia or infection. No evidence of portal venous gas. 4. Right-sided obstructive uropathy due to an 8 mm obstructing distal right ureteral calculus. Critical Value/emergent results were called by telephone at the time  of interpretation on 05/18/2021 at 3:28 am to provider DAN FLOYD , who verbally acknowledged these results. Electronically Signed   By: Randa Ngo M.D.   On: 05/18/2021 03:28    Impression: Suspected colonic obstruction: Distension of the distal small bowel and colon, with change in caliber at a thick-walled segment of proximal sigmoid colon noted on CT.  CT also showed pneumatosis of cecum and proximal ascending colon, as well as increased size of small bowel metastasis.  Metastatic pancreatic adenocarcinoma s/p distal pancreatectomy, splenectomy, partial gastrectomy, and chemotherapy, duodenal stenosis s/p stenting in 11/2019   Plan: Proceed with EGD and flexible sigmoidoscopy tomorrow. Two Fleet enemas prior to flex sig.  Patient would be at high risk of full colonoscopy given pneumatosis seen in the cecum and proximal ascending colon.  We thoroughly discussed the procedures with the patient to include nature, alternatives, benefits, and risks (including but  not limited to bleeding, infection, perforation, anesthesia/cardiac and pulmonary complications).  Patient verbalized understanding and gave verbal consent to proceed with EGD and flexible sigmoidoscopy.  Clear liquid diet, n.p.o. at midnight.  [facility] will follow.  LOS: 1 day   Salley Slaughter  PA-C 05/19/2021, 10:18 AM  Contact #  [phone]

## 2021-05-19 NOTE — H&P (View-Only) (Signed)
Referring Provider: Dr. Raiford Noble Primary Care Physician:  Dettinger, [name], MD Primary Gastroenterologist:  Althia Forts  Reason for Consultation:  Large bowel obstruction  HPI: Kevin Villegas is a 80 y.o. male with past medical history of metastatic pancreatic adenocarcinoma s/p distal pancreatectomy, splenectomy, partial gastrectomy, and chemotherapy, duodenal stenosis s/p stenting in 11/2019 presenting for consultation of suspected large bowel obstruction.  Patient presented to the ED yesterday due to severe, sharp lower abdominal pain, as well as epigastric pain which seemed to radiate to the back.  He reports constipation since 5/17 and had some improvement on laxatives.  Prior to that, he states he typically had looser stools.  He has not had a BM for several days.  Denies any melena or hematochezia.  Denies nausea/vomiting but has not been eating much for the past week. Denies unexplained weight loss, GERD, or dysphagia.  CT abdomen without contrast 5/31: Distension of the distal small bowel and colon, with change in caliber at a thick-walled segment of proximal sigmoid colon. Distal colonic obstruction cannot be excluded, and colonoscopy may be useful to exclude underlying neoplasm in this region.  Pneumatosis of the cecum and proximal ascending colon. Differential diagnosis would include bowel ischemia or infection. No evidence of portal venous gas. A metallic stent is seen at the duodenal jejunal junction left upper quadrant. Spiculated soft tissue mass within the mesentery surrounding the small bowel in this region measures 3.1 x 2.8 cm, consistent with metastatic disease and increased in size since prior Study.  Last EGD was 11/2019 due to high grade narrowing of distal duodenum secondary to recurrent metastatic disease.  He underwent duodenal stent placement.  LA Grade B esophagitis was noted as well.  He has never had a colonoscopy.  No family history of colon cancer.   Past  Medical History:  Diagnosis Date  . Pancreatic cancer [facility])     Past Surgical History:  Procedure Laterality Date  . ABDOMINAL SURGERY    . CHOLECYSTECTOMY N/A 12/16/2014   Procedure: LAPAROSCOPIC CHOLECYSTECTOMY WITH INTRAOPERATIVE CHOLANGIOGRAM;  Surgeon: Georganna Skeans, MD;  Location: [place];  Service: General;  Laterality: N/A;  . EUS N/A 09/24/2015   Procedure: UPPER ENDOSCOPIC ULTRASOUND (EUS) LINEAR;  Surgeon: Milus Banister, MD;  Location: [facility];  Service: Endoscopy;  Laterality: N/A;  . VASECTOMY      Prior to Admission medications   Medication Sig Start Date End Date Taking? Authorizing Provider  dicyclomine (BENTYL) 20 MG tablet Take 0.5 tablets (10 mg total) by mouth every 6 (six) hours as needed (For abdominal cramping). 05/16/21  Yes Molpus, John, MD  lidocaine-prilocaine (EMLA) cream Apply 1 application topically as needed. Port access 05/04/21  Yes [provider]  ondansetron (ZOFRAN) 8 MG tablet Take 8 mg by mouth every 8 (eight) hours as needed for nausea or vomiting. 05/04/21  Yes [provider]  prochlorperazine (COMPAZINE) 5 MG tablet Take 5 mg by mouth every 6 (six) hours as needed for nausea or vomiting. 05/04/21  Yes [provider]    Scheduled Meds: . Chlorhexidine Gluconate Cloth  6 each Topical Daily   Continuous Infusions: . dextrose 5 % and 0.9% NaCl    . piperacillin-tazobactam (ZOSYN)  IV 3.375 g (05/19/21 0615)   PRN Meds:.acetaminophen **OR** acetaminophen, lidocaine-prilocaine, morphine injection, prochlorperazine  Allergies as of 05/18/2021 - Review Complete 05/18/2021  Allergen Reaction Noted  . Pollen extract Other (See Comments) 09/19/2017    Family History  Problem Relation Age of Onset  . Stroke  Mother     Social History   Socioeconomic History  . Marital status: Married    Spouse name: Not on file  . Number of children: 2  . Years of education: Not on file  . Highest education level: Not on file   Occupational History  . Occupation: retired  Tobacco Use  . Smoking status: Never Smoker  . Smokeless tobacco: Never Used  Vaping Use  . Vaping Use: Never used  Substance and Sexual Activity  . Alcohol use: No    Alcohol/week: 0.0 standard drinks  . Drug use: No  . Sexual activity: Yes  Other Topics Concern  . Not on file  Social History Narrative  . Not on file   Social Determinants of Health   Financial Resource Strain: Not on file  Food Insecurity: Not on file  Transportation Needs: Not on file  Physical Activity: Not on file  Stress: Not on file  Social Connections: Not on file  Intimate Partner Violence: Not on file    Review of Systems: Review of Systems  Constitutional: Negative for chills and fever.  HENT: Negative for hearing loss and tinnitus.   Eyes: Negative for pain and redness.  Respiratory: Negative for cough and shortness of breath.   Cardiovascular: Negative for chest pain and palpitations.  Gastrointestinal: Positive for abdominal pain and constipation. Negative for blood in stool, diarrhea, heartburn, melena, nausea and vomiting.  Genitourinary: Negative for flank pain and hematuria.  Musculoskeletal: Negative for falls and joint pain.  Skin: Negative for itching and rash.  Neurological: Negative for seizures and loss of consciousness.  Endo/Heme/Allergies: Negative for polydipsia. Does not bruise/bleed easily.  Psychiatric/Behavioral: Negative for substance abuse. The patient is not nervous/anxious.      Physical Exam: Vital signs: Vitals:   05/18/21 2224 05/19/21 0656  BP: 122/78 (!) 160/91  Pulse: 77 70  Resp: 14 14  Temp: 97.7 F (36.5 C) (!) 97.4 F (36.3 C)  SpO2: 93% 93%   Last BM Date: 05/17/21  Physical Exam Vitals reviewed.  Constitutional:      General: He is not in acute distress. HENT:     Head: Normocephalic and atraumatic.     Nose: Nose normal. No congestion.     Mouth/Throat:     Mouth: Mucous membranes are moist.      Pharynx: Oropharynx is clear.  Eyes:     General: No scleral icterus.    Extraocular Movements: Extraocular movements intact.     Conjunctiva/sclera: Conjunctivae normal.  Cardiovascular:     Rate and Rhythm: Normal rate and regular rhythm.  Pulmonary:     Effort: Pulmonary effort is normal. No respiratory distress.  Abdominal:     General: Bowel sounds are normal. There is distension.     Palpations: Abdomen is soft. There is no mass.     Tenderness: There is no abdominal tenderness. There is no guarding or rebound.     Hernia: No hernia is present.  Musculoskeletal:        General: No swelling or tenderness.     Cervical back: Normal range of motion and neck supple.  Skin:    General: Skin is warm and dry.  Neurological:     General: No focal deficit present.     Mental Status: He is alert and oriented to person, place, and time.  Psychiatric:        Mood and Affect: Mood normal.        Behavior: Behavior normal.  GI:  Lab Results: Recent Labs    05/18/21 0315 05/19/21 0513  WBC 6.1 6.3  HGB 14.9 13.3  HCT 43.9 40.5  PLT 514* 596*   BMET Recent Labs    05/18/21 0315 05/19/21 0513  NA 135 135  K 3.7 4.5  CL 105 107  CO2 21* 19*  GLUCOSE 145* 100*  BUN 9 44*  CREATININE 0.70 2.00*  CALCIUM 8.8* 8.2*   LFT Recent Labs    05/19/21 0513  PROT 6.4*  ALBUMIN 3.2*  AST 26  ALT 28  ALKPHOS 54  BILITOT 0.6   PT/INR No results for input(s): LABPROT, INR in the last 72 hours.   Studies/Results: CT ABDOMEN PELVIS WO CONTRAST  Result Date: 05/18/2021 CLINICAL DATA:  History of pancreatic cancer status post distal pancreatectomy, worsening abdominal pain EXAM: CT ABDOMEN AND PELVIS WITHOUT CONTRAST TECHNIQUE: Multidetector CT imaging of the abdomen and pelvis was performed following the standard protocol without IV contrast. COMPARISON:  11/24/2019 FINDINGS: Unenhanced CT was performed per clinician order. Lack of IV contrast limits sensitivity and  specificity, especially for evaluation of abdominal/pelvic solid viscera. Lower chest: There are numerous bibasilar pulmonary nodules which have increased in size and number since prior study, consistent with progressive pulmonary metastases. Index nodule in the right lower lobe measures 17 x 15 mm image 3/2, previously having measured 7 x 9 mm. No effusion or pneumothorax. Dense calcification of the aortic valve. Hepatobiliary: Stable liver hypodensities likely representing cysts. Gallbladder surgically absent. No biliary duct dilation. Pancreas: The head and uncinate process of the pancreas unremarkable. Distal pancreatectomy has been performed. Spleen: The spleen is surgically absent. Adrenals/Urinary Tract: There is progressive bilateral renal cortical atrophy. There is significant right-sided obstructive uropathy related to an obstructing 8 mm distal right ureteral calculus, reference image 71/2. No left-sided calculi. The bladder is minimally distended which limits its evaluation. The adrenals are grossly normal. Stomach/Bowel: There is pneumatosis involving the cecum, which could be due to ischemia or underlying infection. There is diffuse gaseous distension of the jejunum, ileum, and colon. There is change in the caliber of the distal colon with segmental narrowing and wall thickening of the proximal sigmoid colon, reference image 69 through 73, which could reflect postinflammatory scarring as there is diffuse diverticulosis in this region. Underlying neoplasm cannot be excluded. A metallic stent is seen at the duodenal jejunal junction left upper quadrant. Spiculated soft tissue mass within the mesentery surrounding the small bowel in this region measures 3.1 x 2.8 cm, consistent with metastatic disease and increased in size since prior study. Vascular/Lymphatic: Mild atherosclerosis of the aorta and its branches. No discrete pathologic adenopathy is identified on this unenhanced exam. Reproductive:  Prostate is unremarkable. Other: No free fluid or free intraperitoneal gas. No abdominal wall hernia. Musculoskeletal: No acute or destructive bony lesions. Reconstructed images demonstrate no additional findings. IMPRESSION: 1. Progressive metastatic disease, with new and enlarging pulmonary nodules and enlarging mesenteric soft tissue mass at the duodenal jejunal junction as above. 2. Distension of the distal small bowel and colon, with change in caliber at a thick-walled segment of proximal sigmoid colon. Distal colonic obstruction cannot be excluded, and colonoscopy may be useful to exclude underlying neoplasm in this region. 3. Pneumatosis of the cecum and proximal ascending colon. Differential diagnosis would include bowel ischemia or infection. No evidence of portal venous gas. 4. Right-sided obstructive uropathy due to an 8 mm obstructing distal right ureteral calculus. Critical Value/emergent results were called by telephone at the time  of interpretation on 05/18/2021 at 3:28 am to provider DAN FLOYD , who verbally acknowledged these results. Electronically Signed   By: Randa Ngo M.D.   On: 05/18/2021 03:28    Impression: Suspected colonic obstruction: Distension of the distal small bowel and colon, with change in caliber at a thick-walled segment of proximal sigmoid colon noted on CT.  CT also showed pneumatosis of cecum and proximal ascending colon, as well as increased size of small bowel metastasis.  Metastatic pancreatic adenocarcinoma s/p distal pancreatectomy, splenectomy, partial gastrectomy, and chemotherapy, duodenal stenosis s/p stenting in 11/2019   Plan: Proceed with EGD and flexible sigmoidoscopy tomorrow. Two Fleet enemas prior to flex sig.  Patient would be at high risk of full colonoscopy given pneumatosis seen in the cecum and proximal ascending colon.  We thoroughly discussed the procedures with the patient to include nature, alternatives, benefits, and risks (including but  not limited to bleeding, infection, perforation, anesthesia/cardiac and pulmonary complications).  Patient verbalized understanding and gave verbal consent to proceed with EGD and flexible sigmoidoscopy.  Clear liquid diet, n.p.o. at midnight.  [facility] will follow.  LOS: 1 day   Salley Slaughter  PA-C 05/19/2021, 10:18 AM  Contact #  [phone]

## 2021-05-20 ENCOUNTER — Encounter (HOSPITAL_COMMUNITY): Payer: Self-pay | Admitting: Family Medicine

## 2021-05-20 ENCOUNTER — Encounter (HOSPITAL_COMMUNITY)
Admission: EM | Disposition: A | Discharge status: Home / Self Care | Payer: Self-pay | Source: Home / Self Care | Attending: Internal Medicine

## 2021-05-20 ENCOUNTER — Inpatient Hospital Stay (HOSPITAL_COMMUNITY): Payer: Medicare Other | Admitting: Certified Registered Nurse Anesthetist

## 2021-05-20 DIAGNOSIS — D75839 Thrombocytosis, unspecified: Secondary | ICD-10-CM

## 2021-05-20 HISTORY — PX: FLEXIBLE SIGMOIDOSCOPY: SHX5431

## 2021-05-20 HISTORY — PX: BIOPSY: SHX5522

## 2021-05-20 HISTORY — PX: ESOPHAGOGASTRODUODENOSCOPY: SHX5428

## 2021-05-20 LAB — COMPREHENSIVE METABOLIC PANEL
ALT: 23 U/L (ref 0–44)
AST: 20 U/L (ref 15–41)
Albumin: 3.2 g/dL — ABNORMAL LOW (ref 3.5–5.0)
Alkaline Phosphatase: 54 U/L (ref 38–126)
Anion gap: 7 (ref 5–15)
BUN: 31 mg/dL — ABNORMAL HIGH (ref 8–23)
CO2: 23 mmol/L (ref 22–32)
Calcium: 8.6 mg/dL — ABNORMAL LOW (ref 8.9–10.3)
Chloride: 105 mmol/L (ref 98–111)
Creatinine, Ser: 1.95 mg/dL — ABNORMAL HIGH (ref 0.61–1.24)
GFR, Estimated: 34 mL/min — ABNORMAL LOW (ref >60)
Glucose, Bld: 125 mg/dL — ABNORMAL HIGH (ref 70–99)
Potassium: 4.7 mmol/L (ref 3.5–5.1)
Sodium: 135 mmol/L (ref 135–145)
Total Bilirubin: 0.3 mg/dL (ref 0.3–1.2)
Total Protein: 6.3 g/dL — ABNORMAL LOW (ref 6.5–8.1)

## 2021-05-20 LAB — CBC WITH DIFFERENTIAL/PLATELET
Abs Immature Granulocytes: 0.03 10*3/uL (ref 0.00–0.07)
Basophils Absolute: 0 10*3/uL (ref 0.0–0.1)
Basophils Relative: 0 %
Eosinophils Absolute: 0.2 10*3/uL (ref 0.0–0.5)
Eosinophils Relative: 3 %
HCT: 41.1 % (ref 39.0–52.0)
Hemoglobin: 13.4 g/dL (ref 13.0–17.0)
Immature Granulocytes: 0 %
Lymphocytes Relative: 20 %
Lymphs Abs: 1.4 10*3/uL (ref 0.7–4.0)
MCH: 32 pg (ref 26.0–34.0)
MCHC: 32.6 g/dL (ref 30.0–36.0)
MCV: 98.1 fL (ref 80.0–100.0)
Monocytes Absolute: 1.5 10*3/uL — ABNORMAL HIGH (ref 0.1–1.0)
Monocytes Relative: 21 %
Neutro Abs: 3.9 10*3/uL (ref 1.7–7.7)
Neutrophils Relative %: 56 %
Platelets: 788 10*3/uL — ABNORMAL HIGH (ref 150–400)
RBC: 4.19 MIL/uL — ABNORMAL LOW (ref 4.22–5.81)
RDW: 14.1 % (ref 11.5–15.5)
WBC: 7.1 10*3/uL (ref 4.0–10.5)
nRBC: 0 % (ref 0.0–0.2)

## 2021-05-20 LAB — PHOSPHORUS: Phosphorus: 1.9 mg/dL — ABNORMAL LOW (ref 2.5–4.6)

## 2021-05-20 LAB — MAGNESIUM: Magnesium: 2 mg/dL (ref 1.7–2.4)

## 2021-05-20 SURGERY — EGD (ESOPHAGOGASTRODUODENOSCOPY)
Anesthesia: Monitor Anesthesia Care

## 2021-05-20 MED ORDER — EPHEDRINE SULFATE-NACL 50-0.9 MG/10ML-% IV SOSY
PREFILLED_SYRINGE | INTRAVENOUS | Status: DC | PRN
  Administered 2021-05-20 (×4): 10 mg via INTRAVENOUS

## 2021-05-20 MED ORDER — LACTATED RINGERS IV SOLN: INTRAVENOUS | Status: DC

## 2021-05-20 MED ORDER — SODIUM CHLORIDE 0.9 % IV SOLN: INTRAVENOUS | Status: DC

## 2021-05-20 MED ORDER — PHENYLEPHRINE 40 MCG/ML (10ML) SYRINGE FOR IV PUSH (FOR BLOOD PRESSURE SUPPORT)
PREFILLED_SYRINGE | INTRAVENOUS | Status: DC | PRN
  Administered 2021-05-20 (×2): 80 ug via INTRAVENOUS
  Administered 2021-05-20: 40 ug via INTRAVENOUS
  Administered 2021-05-20: 80 ug via INTRAVENOUS

## 2021-05-20 MED ORDER — PROPOFOL 10 MG/ML IV BOLUS
INTRAVENOUS | Status: AC
  Filled 2021-05-20: qty 20

## 2021-05-20 MED ORDER — LIDOCAINE 2% (20 MG/ML) 5 ML SYRINGE
INTRAMUSCULAR | Status: DC | PRN
  Administered 2021-05-20: 30 mg via INTRAVENOUS
  Administered 2021-05-20: 20 mg via INTRAVENOUS

## 2021-05-20 MED ORDER — PROPOFOL 10 MG/ML IV BOLUS
INTRAVENOUS | Status: DC | PRN
  Administered 2021-05-20: 30 mg via INTRAVENOUS
  Administered 2021-05-20: 20 mg via INTRAVENOUS
  Administered 2021-05-20: 50 mg via INTRAVENOUS

## 2021-05-20 MED ORDER — PROPOFOL 500 MG/50ML IV EMUL
INTRAVENOUS | Status: DC | PRN
  Administered 2021-05-20: 100 ug/kg/min via INTRAVENOUS

## 2021-05-20 MED ORDER — POTASSIUM & SODIUM PHOSPHATES 280-160-250 MG PO PACK
1.0000 | PACK | Freq: Three times a day (TID) | ORAL | Status: DC
  Administered 2021-05-20 – 2021-05-26 (×20): 1 via ORAL
  Filled 2021-05-20 (×25): qty 1

## 2021-05-20 MED ORDER — FLEET ENEMA 7-19 GM/118ML RE ENEM
1.0000 | ENEMA | Freq: Once | RECTAL | Status: DC
  Filled 2021-05-20 (×2): qty 1

## 2021-05-20 NOTE — Progress Notes (Signed)
PROGRESS NOTE    Kevin Villegas  YIF:[id number] DOB: 1940-12-22 DOA: 05/18/2021 PCP: Dettinger, [name], MD   Brief Narrative:  The patient is a 80 year old Caucasian male with a past medical history significant for but not limited to pancreatic cancer and other comorbidities who presented with abdominal pain started 2 nights prior to admission.  Patient states that he had severe sharp lower abdominal pain and described as an 8 out of 10 in severity and stated that it came in 32nd episode.  He reports that he had a weeks worth of constipation prior to this.  He went to the ED and was given laxatives and went home with some improvement in his symptoms.  He states that late yesterday he developed a new abdominal pain and this was epigastric rating to her back.  He described it like a sharp knife type sensation and states it was worsened with food.  He felt as if he had pancreatitis again so he came to the ED for help.  In the ED he had normal lab work but CT of the abdomen pelvis showed progressive metastatic disease with new and enlarging pulmonary nodules and enlarging mesenteric soft tissue mass at the duodenal jejunal junction as well as distention of the distal small bowel and colon which changes in caliber at a thick-walled segment of the proximal sigmoid colon as well as distal colonic obstruction that could not be excluded.  There was also pneumatosis of the cecum and proximal ascending colon that had a differential of bowel ischemia or infection as well as right-sided obstructive uropathy due to an 8 mm obstructing distal right ureteral calculus.  General surgery was consulted as well as gastroenterology as well as medical oncology.  The plan is for the patient to undergo an EGD and flex sigmoidoscopy in the morning  Assessment & Plan:   Active Problems:   Colon obstruction (HCC)  Metastatic Pancreatic Cancer -CT of the abdomen pelvis done and showed progressive metastatic disease with new and  enlarging pulmonary nodules as well as enlarging mesenteric soft tissue mass at the duodenal jejunal junction as well as the distal tension of the distal small bowel and colon -Medical oncology was consulted and patient had recently been started on systemic chemotherapy with gemcitabine and Abraxane and tolerated his first cycle overall with exception of constipation -Medical oncology feels that that his disease progression occurred prior to the chemotherapy starting and recommend for him to resume chemotherapy once he is recovered from his hospitalization as an outpatient -Patient had surgical intervention with [facility] in 2018 -Patient primarily seen oncology care at [facility] but has also seen Dr. Marin Olp in the past -Continue pain control with IV morphine -Dr. Marin Olp is following here   Bowel/colonic Obstruction Abdominal Pain Constipation -Patient was admitted to telemetry inpatient -General Surgery was consulted and appreciate further evaluation and assistance; they recommended bowel rest and IV antibiotics and recommending GI consult to evaluate the colon further -We will continue IV Zosyn -Gastroenterology evaluating and reviewed his scans which showed pneumatosis of the cecum and proximal ascending colon -GI took the patient for EGD and flexible sigmoidoscopy today; patient's EGD showed esophagitis, gastritis and stenosis and inflammation in the third and fourth portion of the duodenum and biopsies were taken.  His flexible sigmoidoscopy was extremely difficult and Dr. Alessandra Bevels was not able to advance the scope beyond the sigmoid colon stricture and there is some associated inflammation.  The scope was changed in ultrasound without any success so she recommends full  liquid diet today continue fleets enemas and consider barium enema tomorrow and follow with biopsies -They feel the patient would be high risk for full colonoscopy given his pneumatosis -GI recommended full liquid diet today and  now  Obstructing Right Ureteral Stone -He is currently asymptomatic from his right ureteral stone and will need to follow-up with urology in outpatient setting given that Dr. Marylyn Ishihara spoke with urology and they felt that the patient can follow-up in outpatient if his UA was normal and his pain was controlled; UA is relatively unremarkable but he does have an AKI as below -May need urology input as an inpatient if creatinine continues to worsen however is minimally improved  Hypophosphatemia -Patient's phosphorus level is 1.9 -Replete with po K Phos NAK -Continue to Monitor and Trend  -Repeat Phos Level in the AM   Thrombocytosis  -Patient's Platelet Count has gone from 514 -> 596 -> 788 -Continue to Monitor and Trend -Repeat CBC in the AM  AKI Metabolic Acidosis -Patient's BUNs/creatinine went from 26/1.71 -> 9/0.70 -> 44/2.00 and today was 31/1.95 -The patient's acidosis has improved and he now has a CO2 of 23, anion gap of 7, chloride level of 105 -We will continue IV fluid hydration with D5 normal saline at 75 MLS per hour -Avoid further nephrotoxic medications, contrast dyes, hypotension and renally adjust medications  -Repeat CMP in a.m.  DVT prophylaxis: SCDs Code Status: Partial Code  Family Communication: No family present at bedside Disposition Plan: Pending Further Clearance by GI and General Surgery   Status is: Inpatient  Remains inpatient appropriate because:Unsafe d/c plan, IV treatments appropriate due to intensity of illness or inability to take PO and Inpatient level of care appropriate due to severity of illness   Dispo: The patient is from: Home              Anticipated d/c is to: Home              Patient currently is not medically stable to d/c.   Difficult to place patient No  Consultants:   Medical Oncology  General Surgery  Gastroenterology   Procedures:  EGD  Findings:      LA Grade C (one or more mucosal breaks continuous between tops of 2 or        more mucosal folds, less than 75% circumference) esophagitis with no       bleeding was found in the mid and distal esophagus. Biopsies were taken       with a cold forceps for histology.      A small hiatal hernia was present.      Scattered severe inflammation characterized by congestion (edema),       erosions, erythema and friability was found in the gastric body and in       the gastric antrum. Biopsies were taken with a cold forceps for       histology.      The cardia and gastric fundus were normal on retroflexion.      An acquired mild stenosis was found in the third and fourth portion of       the duodenum with inflmation and was traversed. Biopsies were taken with       a cold forceps for histology. Impression:               - LA Grade C esophagitis with no bleeding. Biopsied.                           -  Gastritis. Biopsied.                           - Acquired duodenal stenosis. Biopsied.  FLEX SIGMOIDOSCOPY Findings:      The perianal and digital rectal examinations were normal.      A severe stenosis was found in the sigmoid colon and was non-traversed.       Biopsies were taken with a cold forceps for histology.There was       significant tortuosity and narrowing in the sigmoid colon at around 30       to 35 cm. Not able to advance pediatric colonoscopy or ultraslim scope       despite abdominal pressure and changing patient's position to supine       position. There was some inflammation and erythema at this area as well       as some inflammation in the rectosigmoid colon. Biopsies were taken.      Multiple small and large-mouthed diverticula were found in the sigmoid       colon.      Internal hemorrhoids were found during retroflexion. The hemorrhoids       were medium-sized. Impression:               - Stricture in the sigmoid colon. Biopsied.                           - Diverticulosis in the sigmoid colon.                           - Internal  hemorrhoids. Moderate Sedation:      Moderate (conscious) sedation was personally administered by an       anesthesia professional. The following parameters were monitored: oxygen       saturation, heart rate, blood pressure, and response to care. Recommendation:           - Return patient to hospital ward for ongoing care.                           - Full liquid diet.                           - Await pathology results.                           - Perform a barium enema tomorrow.   Antimicrobials:  Anti-infectives (From admission, onward)   Start     Dose/Rate Route Frequency Ordered Stop   05/18/21 1300  piperacillin-tazobactam (ZOSYN) IVPB 3.375 g        3.375 g 12.5 mL/hr over 240 Minutes Intravenous Every 8 hours 05/18/21 1216     05/18/21 0330  piperacillin-tazobactam (ZOSYN) IVPB 3.375 g        3.375 g 100 mL/hr over 30 Minutes Intravenous  Once 05/18/21 0326 05/18/21 0452        Subjective: Seen and examined at bedside and he was doing okay and had some abdominal discomfort still.  Awaiting to have his EGD and flex sigmoidoscopy.  Denies any chest pain or shortness breath.  No other concerns or complaints this time.  Objective: Vitals:   05/20/21 1354 05/20/21 1400 05/20/21 1410 05/20/21 1418  BP: 124/68 129/60 Marland Kitchen)  151/87 (!) 149/97  Pulse: 70 67 64 67  Resp: 14 (!) 21 17 20   Temp: 98.1 F (36.7 C)     TempSrc: Oral     SpO2: 100% 99% 95% 95%  Weight:      Height:        Intake/Output Summary (Last 24 hours) at 05/20/2021 1603 Last data filed at 05/19/2021 1640 Gross per 24 hour  Intake 455.46 ml  Output --  Net 455.46 ml   Filed Weights   05/18/21 0125  Weight: 74.4 kg   Examination: Physical Exam:  Constitutional: Thin elderly Caucasian male in NAD and appears calm  Eyes: Lids and conjunctivae normal, sclerae anicteric  ENMT: External Ears, Nose appear normal. Grossly normal hearing. Neck: Appears normal, supple, no cervical masses, normal ROM, no  appreciable thyromegaly; no JVD Respiratory: Diminished to auscultation bilaterally, no wheezing, rales, rhonchi or crackles. Normal respiratory effort and patient is not tachypenic. No accessory muscle use. Unlabored breathing Cardiovascular: RRR, no murmurs / rubs / gallops. S1 and S2 auscultated. No appreciable LE Edema Abdomen: Soft, non-tender, Mildly distended 2/2 to body habitus. Bowel sounds positive.  GU: Deferred. Musculoskeletal: No clubbing / cyanosis of digits/nails. No joint deformity upper and lower extremities. Skin: No rashes, lesions, ulcers on a limited skin evaluation. No induration; Warm and dry.  Neurologic: CN 2-12 grossly intact with no focal deficits. Romberg sign and cerebellar reflexes not assessed.  Psychiatric: Normal judgment and insight. Alert and oriented x 3. Normal mood and appropriate affect.   Data Reviewed: I have personally reviewed following labs and imaging studies  CBC: Recent Labs  Lab 05/18/21 0315 05/19/21 0513 05/20/21 0534  WBC 6.1 6.3 7.1  NEUTROABS 4.1  --  3.9  HGB 14.9 13.3 13.4  HCT 43.9 40.5 41.1  MCV 95.2 98.1 98.1  PLT 514* 596* 973*   Basic Metabolic Panel: Recent Labs  Lab 05/18/21 0315 05/19/21 0513 05/20/21 0534  NA 135 135 135  K 3.7 4.5 4.7  CL 105 107 105  CO2 21* 19* 23  GLUCOSE 145* 100* 125*  BUN 9 44* 31*  CREATININE 0.70 2.00* 1.95*  CALCIUM 8.8* 8.2* 8.6*  MG  --   --  2.0  PHOS  --   --  1.9*   GFR: Estimated Creatinine Clearance: 32.3 mL/min (A) (by C-G formula based on SCr of 1.95 mg/dL (H)). Liver Function Tests: Recent Labs  Lab 05/18/21 0315 05/19/21 0513 05/20/21 0534  AST 20 26 20   ALT 13 28 23   ALKPHOS 36* 54 54  BILITOT 0.7 0.6 0.3  PROT 7.0 6.4* 6.3*  ALBUMIN 4.0 3.2* 3.2*   Recent Labs  Lab 05/18/21 0315  LIPASE 23   No results for input(s): AMMONIA in the last 168 hours. Coagulation Profile: No results for input(s): INR, PROTIME in the last 168 hours. Cardiac Enzymes: No  results for input(s): CKTOTAL, CKMB, CKMBINDEX, TROPONINI in the last 168 hours. BNP (last 3 results) No results for input(s): PROBNP in the last 8760 hours. HbA1C: No results for input(s): HGBA1C in the last 72 hours. CBG: No results for input(s): GLUCAP in the last 168 hours. Lipid Profile: No results for input(s): CHOL, HDL, LDLCALC, TRIG, CHOLHDL, LDLDIRECT in the last 72 hours. Thyroid Function Tests: No results for input(s): TSH, T4TOTAL, FREET4, T3FREE, THYROIDAB in the last 72 hours. Anemia Panel: No results for input(s): VITAMINB12, FOLATE, FERRITIN, TIBC, IRON, RETICCTPCT in the last 72 hours. Sepsis Labs: Recent Labs  Lab 05/18/21 [phone]  LATICACIDVEN 1.2    Recent Results (from the past 240 hour(s))  Resp Panel by RT-PCR (Flu A&B, Covid) Nasopharyngeal Swab     Status: None   Collection Time: 05/18/21  3:49 AM   Specimen: Nasopharyngeal Swab; Nasopharyngeal(NP) swabs in vial transport medium  Result Value Ref Range Status   SARS Coronavirus 2 by RT PCR NEGATIVE NEGATIVE Final    Comment: (NOTE) SARS-CoV-2 target nucleic acids are NOT DETECTED.  The SARS-CoV-2 RNA is generally detectable in upper respiratory specimens during the acute phase of infection. The lowest concentration of SARS-CoV-2 viral copies this assay can detect is 138 copies/mL. A negative result does not preclude SARS-Cov-2 infection and should not be used as the sole basis for treatment or other patient management decisions. A negative result may occur with  improper specimen collection/handling, submission of specimen other than nasopharyngeal swab, presence of viral mutation(s) within the areas targeted by this assay, and inadequate number of viral copies(<138 copies/mL). A negative result must be combined with clinical observations, patient history, and epidemiological information. The expected result is Negative.  Fact Sheet for Patients:  EntrepreneurPulse.com.au  Fact  Sheet for Healthcare Providers:  IncredibleEmployment.be  This test is no t yet approved or cleared by the [place] FDA and  has been authorized for detection and/or diagnosis of SARS-CoV-2 by FDA under an Emergency Use Authorization (EUA). This EUA will remain  in effect (meaning this test can be used) for the duration of the COVID-19 declaration under Section 564(b)(1) of the Act, 21 U.S.C.section 360bbb-3(b)(1), unless the authorization is terminated  or revoked sooner.       Influenza A by PCR NEGATIVE NEGATIVE Final   Influenza B by PCR NEGATIVE NEGATIVE Final    Comment: (NOTE) The Xpert Xpress SARS-CoV-2/FLU/RSV plus assay is intended as an aid in the diagnosis of influenza from Nasopharyngeal swab specimens and should not be used as a sole basis for treatment. Nasal washings and aspirates are unacceptable for Xpert Xpress SARS-CoV-2/FLU/RSV testing.  Fact Sheet for Patients: EntrepreneurPulse.com.au  Fact Sheet for Healthcare Providers: IncredibleEmployment.be  This test is not yet approved or cleared by the [place] FDA and has been authorized for detection and/or diagnosis of SARS-CoV-2 by FDA under an Emergency Use Authorization (EUA). This EUA will remain in effect (meaning this test can be used) for the duration of the COVID-19 declaration under Section 564(b)(1) of the Act, 21 U.S.C. section 360bbb-3(b)(1), unless the authorization is terminated or revoked.  Performed at [facility], [address] [place], [place] [address]     RN Pressure Injury Documentation:     Estimated body mass index is 22.88 kg/m as calculated from the following:   Height as of this encounter: 5\' 11"  (1.803 m).   Weight as of this encounter: 74.4 kg.  Malnutrition Type:   Malnutrition Characteristics:   Nutrition Interventions:   Radiology Studies: No results found. Scheduled Meds: .  Chlorhexidine Gluconate Cloth  6 each Topical Daily  . [START ON 05/21/2021] sodium phosphate  1 enema Rectal Once   Continuous Infusions: . dextrose 5 % and 0.9% NaCl 75 mL/hr at 05/19/21 1133  . piperacillin-tazobactam (ZOSYN)  IV 3.375 g (05/20/21 1436)    LOS: 2 days   Kerney Elbe, DO [facility] PAGER is on [place]  If 7PM-7AM, please contact night-coverage [email or web address]

## 2021-05-20 NOTE — Progress Notes (Signed)
[facility] Progress Note     Subjective: CC-  Drank some liquids yesterday and felt bloated. Denies n/v. Passing flatus, no BM. Denies any current abdominal pain.  Objective: Vital signs in last 24 hours: Temp:  [97.9 F (36.6 C)-98.5 F (36.9 C)] 98.2 F (36.8 C) (06/02 0424) Pulse Rate:  [55-67] 55 (06/02 0424) Resp:  [16-17] 16 (06/02 0424) BP: (144-158)/(86-110) 158/98 (06/02 0424) SpO2:  [95 %-97 %] 96 % (06/02 0424) Last BM Date: 05/17/21  Intake/Output from previous day: 06/01 0701 - 06/02 0700 In: 695.5 [P.O.:240; I.V.:289.4; IV Piggyback:166.1] Out: -  Intake/Output this shift: No intake/output data recorded.  PE: Gen:  Alert, NAD Pulm: rate and effort normal Abd: soft, mild distension, nontender, +BS Skin: no rashes noted, warm and dry  Lab Results:  Recent Labs    05/19/21 0513 05/20/21 0534  WBC 6.3 7.1  HGB 13.3 13.4  HCT 40.5 41.1  PLT 596* 788*   BMET Recent Labs    05/19/21 0513 05/20/21 0534  NA 135 135  K 4.5 4.7  CL 107 105  CO2 19* 23  GLUCOSE 100* 125*  BUN 44* 31*  CREATININE 2.00* 1.95*  CALCIUM 8.2* 8.6*   PT/INR No results for input(s): LABPROT, INR in the last 72 hours. CMP     Component Value Date/Time   NA 135 05/20/2021 0534   NA 141 03/22/2017 1101   K 4.7 05/20/2021 0534   CL 105 05/20/2021 0534   CO2 23 05/20/2021 0534   GLUCOSE 125 (H) 05/20/2021 0534   BUN 31 (H) 05/20/2021 0534   BUN 17 03/22/2017 1101   CREATININE 1.95 (H) 05/20/2021 0534   CREATININE 1.71 (H) 04/28/2021 1032   CALCIUM 8.6 (L) 05/20/2021 0534   PROT 6.3 (L) 05/20/2021 0534   PROT 6.9 03/22/2017 1101   ALBUMIN 3.2 (L) 05/20/2021 0534   ALBUMIN 4.5 03/22/2017 1101   AST 20 05/20/2021 0534   AST 27 04/28/2021 1032   ALT 23 05/20/2021 0534   ALT 18 04/28/2021 1032   ALKPHOS 54 05/20/2021 0534   BILITOT 0.3 05/20/2021 0534   BILITOT 0.5 04/28/2021 1032   GFRNONAA 34 (L) 05/20/2021 0534   GFRNONAA 40 (L) 04/28/2021 1032    GFRAA >60 11/24/2019 1600   Lipase     Component Value Date/Time   LIPASE 23 05/18/2021 0315       Studies/Results: No results found.  Anti-infectives: Anti-infectives (From admission, onward)   Start     Dose/Rate Route Frequency Ordered Stop   05/18/21 1300  piperacillin-tazobactam (ZOSYN) IVPB 3.375 g        3.375 g 12.5 mL/hr over 240 Minutes Intravenous Every 8 hours 05/18/21 1216     05/18/21 0330  piperacillin-tazobactam (ZOSYN) IVPB 3.375 g        3.375 g 100 mL/hr over 30 Minutes Intravenous  Once 05/18/21 0326 05/18/21 0452       Assessment/Plan Abdominal pain, constipation Concern for large bowel obstruction - CT scan shows distension of the distal small bowel and colon with change in caliber at a thick-walled segment of proximal sigmoid colon, distal colonic obstruction cannot be excluded - Going for flex sig and EGD today with GI.  Hx metastatic pancreatic cancer  - s/p distal pancreatectomy, splenectomy, and partial gastrectomy in 10/2017  - CT scan shows progressive metastatic disease with new/enlarging pulmonary nodules and enlarging mesenteric soft tissue mass at the duodenal jejunal junction  - recently started chemo for recurrent cancer, Dr. Marin Olp  following  ID - zosyn 5/31>> FEN - IVF, NPO VTE - SCDs, per primary/ ok for chemical DVT prophylaxis from surgical standpoint Foley - none Follow up - TBD   LOS: 2 days    Wellington Hampshire, [facility] 05/20/2021, 9:11 AM Please see Amion for pager number during day hours 7:00am-4:30pm

## 2021-05-20 NOTE — Anesthesia Procedure Notes (Signed)
Procedure Name: MAC Date/Time: 05/20/2021 12:49 PM Performed by: Deliah Boston, CRNA Pre-anesthesia Checklist: Patient identified, Emergency Drugs available, Suction available and Patient being monitored Patient Re-evaluated:Patient Re-evaluated prior to induction Oxygen Delivery Method: Simple face mask Preoxygenation: Pre-oxygenation with 100% oxygen Induction Type: IV induction Placement Confirmation: positive ETCO2 and breath sounds checked- equal and bilateral

## 2021-05-20 NOTE — Brief Op Note (Signed)
05/18/2021 - 05/20/2021  2:06 PM  PATIENT:  Kevin Villegas  80 y.o. male  PRE-OPERATIVE DIAGNOSIS:  abnormal CT of colon, pancreatic cancer with metastasis to small bowel  POST-OPERATIVE DIAGNOSIS:  abnormal EGD & colon  PROCEDURE:  Procedure(s): ESOPHAGOGASTRODUODENOSCOPY (EGD) (N/A) FLEXIBLE SIGMOIDOSCOPY (N/A) BIOPSY  SURGEON:  Surgeon(s) and Role:    * Heloise Gordan, MD - Primary  Findings ---------- -EGD showed esophagitis, gastritis and stenosis and inflammation in third and fourth portion of the duodenum.  Biopsies taken. -Flexible sigmoidoscopy was difficult and I was not able to advance scope beyond sigmoid colon stricture and there was some associated inflammation.  Scope was changed to ultraslim without any success.  Recommendations ----------------------- -Full liquid diet today -Continue fleets enema -Consider barium enema tomorrow  -Follow biopsies -GI will follow  Otis Brace MD, FACP 05/20/2021, 2:07 PM  Contact #  [phone]

## 2021-05-20 NOTE — Progress Notes (Signed)
Unfortunately, looks like [name] has gone back a little bit.  He said he tried to eat yesterday and had a lot of abdominal pain.  He feels very bloated.  Look like he was doing okay yesterday and then he began to eat again.  He had a sharp abdominal pain.  This was in the periumbilical area.  He is going for endoscopy today.  Much sure how much is going to be done.  I know that a flexible sigmoidoscopy will be done.  I do not know if an upper endoscopy is also going to be done.  He is worried that he is going to need some kind of surgery to help with the situation.  His labs show white cell count of 7.9.  Hemoglobin 13.4.  Platelet count 788,000.  I think that the thrombocytosis is more reactive.  The sodium is 135.  Potassium 4.7.  BUN 31 creatinine 1.59.  Calcium 8.6 with an albumin of 3.2.  He has had no fever.  There has been no obvious bleeding.  He has had no cough or shortness of breath.  The vital signs show temperature 98.2.  Pulse 55.  Blood pressure 158/98.  His abdomen is slightly firm.  There is some sign of distention.  Bowel sounds are present but decreased.  He has tenderness to palpation.  Lungs are clear.  Cardiac exam regular rate and rhythm.  Extremity shows no clubbing, cyanosis or edema.  Again, we have this issue with respect to his intestines.  I will know if this is some kind of colitis.  I will know if there is any obvious obstruction right now.  It would be interesting to see what the endoscopy shows.  I do appreciate the fantastic care that he is getting from all the staff up on [address].  Lattie Haw, MD  Psalm 147:3

## 2021-05-20 NOTE — Op Note (Signed)
[facility] Patient Name: Kevin Villegas Procedure Date: 05/20/2021 MRN: [id number] Attending MD: Otis Brace , MD Date of Birth: 1941-01-09 CSN: [id number] Age: 80 Admit Type: Inpatient Procedure:                Upper GI endoscopy Indications:              Abnormal CT of the GI tract Providers:                Otis Brace, MD, Nelia Shi, RN, Cherylynn Ridges, Technician, Heide Scales, CRNA Referring MD:              Medicines:                Sedation Administered by an Anesthesia Professional Complications:            No immediate complications. Estimated Blood Loss:     Estimated blood loss was minimal. Procedure:                Pre-Anesthesia Assessment:                           - Prior to the procedure, a History and Physical                            was performed, and patient medications and                            allergies were reviewed. The patient's tolerance of                            previous anesthesia was also reviewed. The risks                            and benefits of the procedure and the sedation                            options and risks were discussed with the patient.                            All questions were answered, and informed consent                            was obtained. Prior Anticoagulants: The patient has                            taken no previous anticoagulant or antiplatelet                            agents. ASA Grade Assessment: III - A patient with                            severe systemic disease. After reviewing the risks  and benefits, the patient was deemed in                            satisfactory condition to undergo the procedure.                           After obtaining informed consent, the endoscope was                            passed under direct vision. Throughout the                            procedure, the patient's blood  pressure, pulse, and                            oxygen saturations were monitored continuously. The                            GIF-H190 ([id number]) Olympus gastroscope was                            introduced through the mouth, and advanced to the                            fourth part of duodenum. The upper GI endoscopy was                            accomplished without difficulty. The patient                            tolerated the procedure well. Scope In: Scope Out: Findings:      LA Grade C (one or more mucosal breaks continuous between tops of 2 or       more mucosal folds, less than 75% circumference) esophagitis with no       bleeding was found in the mid and distal esophagus. Biopsies were taken       with a cold forceps for histology.      A small hiatal hernia was present.      Scattered severe inflammation characterized by congestion (edema),       erosions, erythema and friability was found in the gastric body and in       the gastric antrum. Biopsies were taken with a cold forceps for       histology.      The cardia and gastric fundus were normal on retroflexion.      An acquired mild stenosis was found in the third and fourth portion of       the duodenum with inflmation and was traversed. Biopsies were taken with       a cold forceps for histology. Impression:               - LA Grade C esophagitis with no bleeding. Biopsied.                           - Gastritis. Biopsied.                           -  Acquired duodenal stenosis. Biopsied. Moderate Sedation:      Moderate (conscious) sedation was personally administered by an       anesthesia professional. The following parameters were monitored: oxygen       saturation, heart rate, blood pressure, and response to care. Recommendation:           - Perform a flexible sigmoidoscopy today. Procedure Code(s):        --- Professional ---                           [phone], Esophagogastroduodenoscopy, flexible,                             transoral; with biopsy, single or multiple Diagnosis Code(s):        --- Professional ---                           K20.90, Esophagitis, unspecified without bleeding                           K29.70, Gastritis, unspecified, without bleeding                           K31.5, Obstruction of duodenum                           R93.3, Abnormal findings on diagnostic imaging of                            other parts of digestive tract CPT copyright 2019 [employer]. All rights reserved. The codes documented in this report are preliminary and upon coder review may  be revised to meet current compliance requirements. Otis Brace, MD Otis Brace, MD 05/20/2021 1:56:09 PM Number of Addenda: 0

## 2021-05-20 NOTE — Anesthesia Postprocedure Evaluation (Signed)
Anesthesia Post Note  Patient: Zyshawn Bohnenkamp  Procedure(s) Performed: ESOPHAGOGASTRODUODENOSCOPY (EGD) (N/A ) FLEXIBLE SIGMOIDOSCOPY (N/A ) BIOPSY     Patient location during evaluation: PACU Anesthesia Type: MAC Level of consciousness: awake and alert and oriented Pain management: pain level controlled Vital Signs Assessment: post-procedure vital signs reviewed and stable Respiratory status: spontaneous breathing, nonlabored ventilation and respiratory function stable Cardiovascular status: stable and blood pressure returned to baseline Postop Assessment: no apparent nausea or vomiting Anesthetic complications: no   No complications documented.  Last Vitals:  Vitals:   05/20/21 1410 05/20/21 1418  BP: (!) 151/87 (!) 149/97  Pulse: 64 67  Resp: 17 20  Temp:    SpO2: 95% 95%    Last Pain:  Vitals:   05/20/21 1418  TempSrc:   PainSc: 0-No pain                 Akio Hudnall A.

## 2021-05-20 NOTE — Transfer of Care (Signed)
Immediate Anesthesia Transfer of Care Note  Patient: Kevin Villegas  Procedure(s) Performed: Procedure(s): ESOPHAGOGASTRODUODENOSCOPY (EGD) (N/A) FLEXIBLE SIGMOIDOSCOPY (N/A) BIOPSY  Patient Location: PACU  Anesthesia Type:MAC  Level of Consciousness: Patient easily awoken, sedated, comfortable, cooperative, following commands, responds to stimulation.   Airway & Oxygen Therapy: Patient spontaneously breathing, ventilating well, oxygen via simple oxygen mask.  Post-op Assessment: Report given to PACU RN, vital signs reviewed and stable, moving all extremities.   Post vital signs: Reviewed and stable.  Complications: No apparent anesthesia complications  Last Vitals:  Vitals Value Taken Time  BP 124/68 05/20/21 1354  Temp    Pulse 72 05/20/21 1358  Resp 23 05/20/21 1358  SpO2 100 % 05/20/21 1358  Vitals shown include unvalidated device data.  Last Pain:  Vitals:   05/20/21 1354  TempSrc:   PainSc: 0-No pain         Complications: No complications documented.

## 2021-05-20 NOTE — Op Note (Signed)
[facility] Patient Name: Kevin Villegas Procedure Date: 05/20/2021 MRN: [id number] Attending MD: Otis Brace , MD Date of Birth: October 06, 1941 CSN: [id number] Age: 80 Admit Type: Inpatient Procedure:                Flexible Sigmoidoscopy Indications:              Abnormal CT of the GI tract, Suspected colonic                            obstruction Providers:                Otis Brace, MD, Nelia Shi, RN, Cherylynn Ridges, Technician, Heide Scales, CRNA Referring MD:              Medicines:                Sedation Administered by an Anesthesia Professional Complications:            No immediate complications. Estimated Blood Loss:     Estimated blood loss was minimal. Procedure:                Pre-Anesthesia Assessment:                           - Prior to the procedure, a History and Physical                            was performed, and patient medications and                            allergies were reviewed. The patient's tolerance of                            previous anesthesia was also reviewed. The risks                            and benefits of the procedure and the sedation                            options and risks were discussed with the patient.                            All questions were answered, and informed consent                            was obtained. Prior Anticoagulants: The patient has                            taken no previous anticoagulant or antiplatelet                            agents. ASA Grade Assessment: III - A patient with  severe systemic disease. After reviewing the risks                            and benefits, the patient was deemed in                            satisfactory condition to undergo the procedure.                           After obtaining informed consent, the scope was                            passed under direct vision. The PCF-H190DL                             ([id number]) Olympus pediatric colonscope was                            introduced through the anus and advanced to the 30                            cm from the anal verge. The PCF-PH190L ([id number])                            Olympus ultra slim endoscope was introduced through                            the anus and advanced to the 30 cm from the anal                            verge. The flexible sigmoidoscopy was unusually                            difficult due to bowel stenosis and restricted                            mobility of the colon. Successful completion of the                            procedure was aided by withdrawing the scope and                            replacing with the 'babyscope'. Scope In: 1:13:45 PM Scope Out: 1:44:20 PM Total Procedure Duration: 0 hours 30 minutes 35 seconds  Findings:      The perianal and digital rectal examinations were normal.      A severe stenosis was found in the sigmoid colon and was non-traversed.       Biopsies were taken with a cold forceps for histology.There was       significant tortuosity and narrowing in the sigmoid colon at around 30       to 35 cm. Not able to advance pediatric colonoscopy or ultraslim scope       despite abdominal pressure and changing patient's position to supine  position. There was some inflammation and erythema at this area as well       as some inflammation in the rectosigmoid colon. Biopsies were taken.      Multiple small and large-mouthed diverticula were found in the sigmoid       colon.      Internal hemorrhoids were found during retroflexion. The hemorrhoids       were medium-sized. Impression:               - Stricture in the sigmoid colon. Biopsied.                           - Diverticulosis in the sigmoid colon.                           - Internal hemorrhoids. Moderate Sedation:      Moderate (conscious) sedation was personally administered by an       anesthesia  professional. The following parameters were monitored: oxygen       saturation, heart rate, blood pressure, and response to care. Recommendation:           - Return patient to hospital ward for ongoing care.                           - Full liquid diet.                           - Await pathology results.                           - Perform a barium enema tomorrow. Procedure Code(s):        --- Professional ---                           [phone], Sigmoidoscopy, flexible; with biopsy, single                            or multiple Diagnosis Code(s):        --- Professional ---                           [phone], Other intestinal obstruction unspecified                            as to partial versus complete obstruction                           K64.8, Other hemorrhoids                           K57.30, Diverticulosis of large intestine without                            perforation or abscess without bleeding                           R93.3, Abnormal findings on diagnostic imaging of  other parts of digestive tract CPT copyright 2019 American Medical Association. All rights reserved. The codes documented in this report are preliminary and upon coder review may  be revised to meet current compliance requirements. Otis Brace, MD Otis Brace, MD 05/20/2021 2:06:02 PM Number of Addenda: 0

## 2021-05-20 NOTE — Anesthesia Preprocedure Evaluation (Signed)
Anesthesia Evaluation  Patient identified by MRN, date of birth, ID band Patient awake    Reviewed: Allergy & Precautions, NPO status , Patient's Chart, lab work & pertinent test results  Airway Mallampati: II  TM Distance: >3 FB Neck ROM: Full    Dental no notable dental hx. (+) Teeth Intact, Dental Advisory Given   Pulmonary neg pulmonary ROS,    Pulmonary exam normal breath sounds clear to auscultation       Cardiovascular negative cardio ROS Normal cardiovascular exam Rhythm:Regular Rate:Normal     Neuro/Psych negative neurological ROS  negative psych ROS   GI/Hepatic Neg liver ROS, Abnormal CT Scan of abdomen  Hx/o Pancreatic Ca with metastasis to small bowel Hx/o bowel obstruction   Endo/Other  negative endocrine ROS  Renal/GU Renal InsufficiencyRenal disease  negative genitourinary   Musculoskeletal negative musculoskeletal ROS (+)   Abdominal   Peds  Hematology   Anesthesia Other Findings   Reproductive/Obstetrics                             Anesthesia Physical Anesthesia Plan  ASA: II  Anesthesia Plan: MAC   Post-op Pain Management:    Induction: Intravenous  PONV Risk Score and Plan: 1 and Treatment may vary due to age or medical condition and Propofol infusion  Airway Management Planned: Natural Airway and Nasal Cannula  Additional Equipment:   Intra-op Plan:   Post-operative Plan:   Informed Consent: I have reviewed the patients History and Physical, chart, labs and discussed the procedure including the risks, benefits and alternatives for the proposed anesthesia with the patient or authorized representative who has indicated his/her understanding and acceptance.     Dental advisory given  Plan Discussed with: Anesthesiologist and CRNA  Anesthesia Plan Comments:         Anesthesia Quick Evaluation

## 2021-05-20 NOTE — Interval H&P Note (Signed)
History and Physical Interval Note:  05/20/2021 12:09 PM  Kevin Villegas  has presented today for surgery, with the diagnosis of abnormal CT of colon, pancreatic cancer with metastasis to small bowel.  The various methods of treatment have been discussed with the patient and family. After consideration of risks, benefits and other options for treatment, the patient has consented to  Procedure(s): ESOPHAGOGASTRODUODENOSCOPY (EGD) (N/A) FLEXIBLE SIGMOIDOSCOPY (N/A) as a surgical intervention.  The patient's history has been reviewed, patient examined, no change in status, stable for surgery.  I have reviewed the patient's chart and labs.  Questions were answered to the patient's satisfaction.     Denym Rahimi

## 2021-05-21 ENCOUNTER — Encounter (HOSPITAL_COMMUNITY): Payer: Self-pay | Admitting: Gastroenterology

## 2021-05-21 DIAGNOSIS — K56699 Other intestinal obstruction unspecified as to partial versus complete obstruction: Principal | ICD-10-CM

## 2021-05-21 LAB — CBC WITH DIFFERENTIAL/PLATELET
Abs Immature Granulocytes: 0.03 10*3/uL (ref 0.00–0.07)
Basophils Absolute: 0 10*3/uL (ref 0.0–0.1)
Basophils Relative: 1 %
Eosinophils Absolute: 0.2 10*3/uL (ref 0.0–0.5)
Eosinophils Relative: 4 %
HCT: 35.2 % — ABNORMAL LOW (ref 39.0–52.0)
Hemoglobin: 11.3 g/dL — ABNORMAL LOW (ref 13.0–17.0)
Immature Granulocytes: 1 %
Lymphocytes Relative: 30 %
Lymphs Abs: 1.8 10*3/uL (ref 0.7–4.0)
MCH: 31.7 pg (ref 26.0–34.0)
MCHC: 32.1 g/dL (ref 30.0–36.0)
MCV: 98.9 fL (ref 80.0–100.0)
Monocytes Absolute: 1 10*3/uL (ref 0.1–1.0)
Monocytes Relative: 18 %
Neutro Abs: 2.8 10*3/uL (ref 1.7–7.7)
Neutrophils Relative %: 46 %
Platelets: 655 10*3/uL — ABNORMAL HIGH (ref 150–400)
RBC: 3.56 MIL/uL — ABNORMAL LOW (ref 4.22–5.81)
RDW: 14.1 % (ref 11.5–15.5)
WBC: 5.9 10*3/uL (ref 4.0–10.5)
nRBC: 0 % (ref 0.0–0.2)

## 2021-05-21 LAB — COMPREHENSIVE METABOLIC PANEL
ALT: 18 U/L (ref 0–44)
AST: 16 U/L (ref 15–41)
Albumin: 2.7 g/dL — ABNORMAL LOW (ref 3.5–5.0)
Alkaline Phosphatase: 43 U/L (ref 38–126)
Anion gap: 6 (ref 5–15)
BUN: 19 mg/dL (ref 8–23)
CO2: 23 mmol/L (ref 22–32)
Calcium: 7.9 mg/dL — ABNORMAL LOW (ref 8.9–10.3)
Chloride: 109 mmol/L (ref 98–111)
Creatinine, Ser: 1.6 mg/dL — ABNORMAL HIGH (ref 0.61–1.24)
GFR, Estimated: 44 mL/min — ABNORMAL LOW (ref >60)
Glucose, Bld: 125 mg/dL — ABNORMAL HIGH (ref 70–99)
Potassium: 4 mmol/L (ref 3.5–5.1)
Sodium: 138 mmol/L (ref 135–145)
Total Bilirubin: 0.3 mg/dL (ref 0.3–1.2)
Total Protein: 5.4 g/dL — ABNORMAL LOW (ref 6.5–8.1)

## 2021-05-21 LAB — MAGNESIUM: Magnesium: 1.9 mg/dL (ref 1.7–2.4)

## 2021-05-21 LAB — PHOSPHORUS: Phosphorus: 2.8 mg/dL (ref 2.5–4.6)

## 2021-05-21 MED ORDER — ENSURE ENLIVE PO LIQD
237.0000 mL | Freq: Three times a day (TID) | ORAL | Status: DC
  Administered 2021-05-21 – 2021-05-25 (×8): 237 mL via ORAL

## 2021-05-21 MED ORDER — ENSURE ENLIVE PO LIQD
237.0000 mL | Freq: Two times a day (BID) | ORAL | Status: DC
  Administered 2021-05-21: 237 mL via ORAL

## 2021-05-21 MED ORDER — ADULT MULTIVITAMIN W/MINERALS CH
1.0000 | ORAL_TABLET | Freq: Every day | ORAL | Status: DC
  Administered 2021-05-21 – 2021-05-25 (×4): 1 via ORAL
  Filled 2021-05-21 (×5): qty 1

## 2021-05-21 NOTE — Progress Notes (Signed)
[facility] Gastroenterology Progress Note  Kevin Villegas 80 y.o. 26-Jan-1941  CC:  Colonic obstruction  Subjective: Patient reports feeling better. Denies abdominal pain, nausea, vomiting. Is tolerating a full liquid diet.  Had bowel movement after fleets enemas yesterday and is passing flatus today. No BM thus far today.  ROS : Review of Systems  Cardiovascular: Negative for chest pain and palpitations.  Gastrointestinal: Negative for abdominal pain, blood in stool, constipation, diarrhea, heartburn, melena, nausea and vomiting.   Objective: Vital signs in last 24 hours: Vitals:   05/20/21 2148 05/21/21 0606  BP: 135/81 125/82  Pulse: (!) 58 61  Resp: 14 16  Temp: 98.3 F (36.8 C) 97.7 F (36.5 C)  SpO2: 92% 96%    Physical Exam:  General:  Alert, cooperative, no distress  Head:  Normocephalic, without obvious abnormality, atraumatic  Eyes:  Anicteric sclera, EOMs intact  Lungs:   Clear to auscultation bilaterally, respirations unlabored  Heart:  Regular rate and rhythm, S1, S2 normal  Abdomen:   Soft, non-tender, non-distended, normoactive bowel sounds   Extremities: Extremities normal, atraumatic, no  edema    Lab Results: Recent Labs    05/20/21 0534 05/21/21 0609  NA 135 138  K 4.7 4.0  CL 105 109  CO2 23 23  GLUCOSE 125* 125*  BUN 31* 19  CREATININE 1.95* 1.60*  CALCIUM 8.6* 7.9*  MG 2.0 1.9  PHOS 1.9* 2.8   Recent Labs    05/20/21 0534 05/21/21 0609  AST 20 16  ALT 23 18  ALKPHOS 54 43  BILITOT 0.3 0.3  PROT 6.3* 5.4*  ALBUMIN 3.2* 2.7*   Recent Labs    05/20/21 0534 05/21/21 0609  WBC 7.1 5.9  NEUTROABS 3.9 2.8  HGB 13.4 11.3*  HCT 41.1 35.2*  MCV 98.1 98.9  PLT 788* 655*   No results for input(s): LABPROT, INR in the last 72 hours.   Assessment: Colonic obstruction: Flex sig 6/2 revealed a severe stenosis in the sigmoid colon, non-traversed. Biopsies pending.There was significant tortuosity and narrowing in the sigmoid colon at around  30 to 35 cm. Not able to advance pediatric colonoscopy or ultraslim scope despite abdominal pressure and changing patient's position to supine position.  -CT also showed pneumatosis of cecum and proximal ascending colon, as well as increased size of small bowel metastasis.  Metastatic pancreatic adenocarcinoma s/p distal pancreatectomy, splenectomy, partial gastrectomy, and chemotherapy, duodenal stenosis s/p stenting in 11/2019  -EGD showed gastritis and an acquired mild stenosis was found in the third and fourth portion of the duodenum with inflammation and erythema, biopsied  Plan: Single contrast enema today to evaluate extent of stenosis in colon.  Pending results, consider colonic stent placement (Dr. Watt Climes).  NPO until post-enema, then full liquid diet OK.  [facility] will follow.  Salley Slaughter PA-C 05/21/2021, 11:53 AM  Contact #  [phone]

## 2021-05-21 NOTE — Progress Notes (Signed)
[facility] Progress Note  1 Day Post-Op  Subjective: CC-  Feeling better today than yesterday. Tolerated full liquids after flex sig yesterday. Denies abdominal pain, nausea, vomiting. BM with enemas yesterday, passing flatus today. Stricture in the sigmoid colon seen on flex sig yesterday, biopsy pending. GI planning barium enema today.  Objective: Vital signs in last 24 hours: Temp:  [97.7 F (36.5 C)-98.3 F (36.8 C)] 97.7 F (36.5 C) (06/03 0606) Pulse Rate:  [58-71] 61 (06/03 0606) Resp:  [14-21] 16 (06/03 0606) BP: (124-176)/(60-97) 125/82 (06/03 0606) SpO2:  [92 %-100 %] 96 % (06/03 0606) Last BM Date: 05/17/21  Intake/Output from previous day: No intake/output data recorded. Intake/Output this shift: No intake/output data recorded.  PE: Gen: Alert, NAD Pulm: rate and effort normal OIZ:TIWP, nondistended, nontender, +BS Skin: no rashes noted, warm and dry  Lab Results:  Recent Labs    05/20/21 0534 05/21/21 0609  WBC 7.1 5.9  HGB 13.4 11.3*  HCT 41.1 35.2*  PLT 788* 655*   BMET Recent Labs    05/20/21 0534 05/21/21 0609  NA 135 138  K 4.7 4.0  CL 105 109  CO2 23 23  GLUCOSE 125* 125*  BUN 31* 19  CREATININE 1.95* 1.60*  CALCIUM 8.6* 7.9*   PT/INR No results for input(s): LABPROT, INR in the last 72 hours. CMP     Component Value Date/Time   NA 138 05/21/2021 0609   NA 141 03/22/2017 1101   K 4.0 05/21/2021 0609   CL 109 05/21/2021 0609   CO2 23 05/21/2021 0609   GLUCOSE 125 (H) 05/21/2021 0609   BUN 19 05/21/2021 0609   BUN 17 03/22/2017 1101   CREATININE 1.60 (H) 05/21/2021 0609   CREATININE 1.71 (H) 04/28/2021 1032   CALCIUM 7.9 (L) 05/21/2021 0609   PROT 5.4 (L) 05/21/2021 0609   PROT 6.9 03/22/2017 1101   ALBUMIN 2.7 (L) 05/21/2021 0609   ALBUMIN 4.5 03/22/2017 1101   AST 16 05/21/2021 0609   AST 27 04/28/2021 1032   ALT 18 05/21/2021 0609   ALT 18 04/28/2021 1032   ALKPHOS 43 05/21/2021 0609   BILITOT 0.3  05/21/2021 0609   BILITOT 0.5 04/28/2021 1032   GFRNONAA 44 (L) 05/21/2021 0609   GFRNONAA 40 (L) 04/28/2021 1032   GFRAA >60 11/24/2019 1600   Lipase     Component Value Date/Time   LIPASE 23 05/18/2021 0315       Studies/Results: No results found.  Anti-infectives: Anti-infectives (From admission, onward)   Start     Dose/Rate Route Frequency Ordered Stop   05/18/21 1300  piperacillin-tazobactam (ZOSYN) IVPB 3.375 g        3.375 g 12.5 mL/hr over 240 Minutes Intravenous Every 8 hours 05/18/21 1216     05/18/21 0330  piperacillin-tazobactam (ZOSYN) IVPB 3.375 g        3.375 g 100 mL/hr over 30 Minutes Intravenous  Once 05/18/21 0326 05/18/21 0452       Assessment/Plan Abdominal pain, constipation Concern for large bowel obstruction -CT scan shows distension of the distal small bowel and colon with change in caliber at a thick-walled segment of proximal sigmoid colon, distal colonic obstruction cannot be excluded - s/p flex sig 6/2: Stricture in the sigmoid colon, biopsy pending - barium enema today per GI  Hx metastatic pancreatic cancer - s/pdistal pancreatectomy, splenectomy, and partial gastrectomy in 10/2017 - CT scan shows progressive metastatic disease with new/enlarging pulmonary nodulesand enlarging mesenteric soft tissue mass at the  duodenaljejunal junction - recently started chemo for recurrent cancer, Dr. Marin Olp following  ID -zosyn 5/31>> FEN -IVF, FLD, Ensure VTE -SCDs, per primary/ ok for chemical DVT prophylaxis from surgical standpoint Foley -none Follow up -TBD  Plan: Await biopsy and barium enema results. Continue full liquids, add Ensure, will ask dietician to see.    LOS: 3 days    [facility] 05/21/2021, 8:10 AM Please see Amion for pager number during day hours 7:00am-4:30pm

## 2021-05-21 NOTE — Care Management Important Message (Signed)
Important Message  Patient Details IM Letter given to the Patient. Name: Kevin Villegas MRN: [id number] Date of Birth: 1941-11-03   Medicare Important Message Given:  Yes     Kerin Salen 05/21/2021, 9:12 AM

## 2021-05-21 NOTE — Progress Notes (Signed)
Initial Nutrition Assessment  DOCUMENTATION CODES:   Not applicable  INTERVENTION:   -Increase Ensure Enlive po to TID, each supplement provides 350 kcal and 20 grams of protein -MVI with minerals daily -Magic cup TID with meals, each supplement provides 290 kcal and 9 grams of protein -RD will follow for diet advancement and adjust supplement regimen as appropriate   NUTRITION DIAGNOSIS:   Increased nutrient needs related to cancer and cancer related treatments as evidenced by estimated needs.  GOAL:   Patient will meet greater than or equal to 90% of their needs  MONITOR:   PO intake,Supplement acceptance,Diet advancement,Labs,Weight trends,Skin,I & O's  REASON FOR ASSESSMENT:   Consult Assessment of nutrition requirement/status  ASSESSMENT:   Kevin Villegas is a 80 y.o. male with medical history significant of pancreatic cancer. Presenting with abdominal pain. 2 nights ago, the patient had severe sharp lower abdominal pain. It was 8/10. It came in 30-second episodes. He reports that he had a weeks worth of constipation prior to this. So he went to the ED. He was given laxatives and went home with some improvement in his symptoms. He states that late yesterday, he had a new abdominal pain. This was epigastric radiating directly to his back. It was "like someone stuck a knife in me." It worsened with food. It reminded him of having pancreatitis. So he came to the ED for help. He denies any other aggravating or alleviating factors.  Pt admitted with bowel obstruction, abdominal pain, and obstructing rt ureter stone.   6/2- s/p Procedure(s): ESOPHAGOGASTRODUODENOSCOPY (EGD) (N/A) FLEXIBLE SIGMOIDOSCOPY (N/A) BIOPSY- revealed esophagitis, gastritis and stenosis/ inflammation in third of fourth portion of duodenum, unable to advance scope beyond colon stricture  Reviewed I/O's: +1.7 L x 24 hours  Pt unavailable at time of visit. Attempted to speak with pt via call to hospital  room phone, however, unable to reach. RD unable to obtain further nutrition-related history or complete nutrition-focused physical exam at this time.   Per general surgery notes, awaiting biopsy and barium enema results.   Pt currently on a full liquid diet. Noted meal completion 100%. Pt has been prescribed Ensure Enlive supplements, which pt has been consuming. Given increased nutritional needs and limitations of full liquid diet, pt would greatly benefit from addition of oral nutrition supplements.   RD unable to identify malnutrition at this time, however, pt is at high risk given advanced age and metastatic cancer, undergoing cancer treatments.   Reviewed wt hx; noted pt was been stable over the past year, however, pt with distant history of weight loss over the past 2 years.   Labs reviewed.   Diet Order:   Diet Order            Diet NPO time specified  Diet effective now                 EDUCATION NEEDS:   No education needs have been identified at this time  Skin:  Skin Assessment: Reviewed RN Assessment  Last BM:  05/21/21  Height:   Ht Readings from Last 1 Encounters:  05/18/21 5\' 11"  (1.803 m)    Weight:   Wt Readings from Last 1 Encounters:  05/18/21 74.4 kg    Ideal Body Weight:  78.2 kg  BMI:  Body mass index is 22.88 kg/m.  Estimated Nutritional Needs:   Kcal:  2150-2350  Protein:  110-125 grams  Fluid:  > 2 L    Loistine Chance, RD, LDN, [place] Registered Dietitian  II Certified Diabetes Care and Education Specialist Please refer to [facility] for RD and/or RD on-call/weekend/after hours pager

## 2021-05-21 NOTE — Progress Notes (Signed)
Results of the upper endoscopy and flexible sigmoidoscopy noted.  Looks that there is a colonic stricture.  I do not know if this would be from external compression of the colon.  Kevin Villegas seems to think that he is going to have surgery for this.  I am unsure exactly what he means.  Looks like there might be a barium enema that is going to be done.  I do not know if some kind of stent can be placed in this area.  He is on a full liquid diet right now.  His labs look relatively stable.  His BUN is 19 and creatinine 1.6.  Calcium 7.9 with an albumin of 2.7.  His white cell count is 5.9.  Hemoglobin 11.3.  Platelet count 655,000.  He has had no fever.  There is been no obvious bleeding.  His vital signs are stable.  Temperature 97.7.  Pulse 61.  Blood pressure 125/82.  There really is no obvious change in the physical exam.  There is still some fullness in the abdomen.  Bowel sounds are decreased but present.  We will just have to see what is next done.  We will have to see if the barium enema is going to be performed.  If there is a stricture, could a stent be placed?  Hopefully, he will be able to go home soon depending on how he can eat and additional work-up.  I do appreciate the great care that all the staff on 6 E. are giving him.  Lattie Haw, MD  Vonna Kotyk 1:5

## 2021-05-21 NOTE — Progress Notes (Signed)
PROGRESS NOTE    Kevin Villegas  HUD:[id number] DOB: June 29, 1941 DOA: 05/18/2021 PCP: Dettinger, [name], MD   Brief Narrative:  The patient is a 80 year old Caucasian male with a past medical history significant for but not limited to pancreatic cancer and other comorbidities who presented with abdominal pain started 2 nights prior to admission.  Patient states that he had severe sharp lower abdominal pain and described as an 8 out of 10 in severity and stated that it came in 32nd episode.  He reports that he had a weeks worth of constipation prior to this.  He went to the ED and was given laxatives and went home with some improvement in his symptoms.  He states that late yesterday he developed a new abdominal pain and this was epigastric rating to her back.  He described it like a sharp knife type sensation and states it was worsened with food.  He felt as if he had pancreatitis again so he came to the ED for help.  In the ED he had normal lab work but CT of the abdomen pelvis showed progressive metastatic disease with new and enlarging pulmonary nodules and enlarging mesenteric soft tissue mass at the duodenal jejunal junction as well as distention of the distal small bowel and colon which changes in caliber at a thick-walled segment of the proximal sigmoid colon as well as distal colonic obstruction that could not be excluded.  There was also pneumatosis of the cecum and proximal ascending colon that had a differential of bowel ischemia or infection as well as right-sided obstructive uropathy due to an 8 mm obstructing distal right ureteral calculus.  General surgery was consulted as well as gastroenterology as well as medical oncology.  The  patient underwent an EGD and flex sigmoidoscopy in the morning  Assessment & Plan:   Active Problems:   Colon obstruction (HCC)  Metastatic Pancreatic Cancer -CT of the abdomen pelvis done and showed progressive metastatic disease with new and enlarging  pulmonary nodules as well as enlarging mesenteric soft tissue mass at the duodenal jejunal junction as well as the distal tension of the distal small bowel and colon -Medical oncology was consulted and patient had recently been started on systemic chemotherapy with gemcitabine and Abraxane and tolerated his first cycle overall with exception of constipation -Medical oncology feels that that his disease progression occurred prior to the chemotherapy starting and recommend for him to resume chemotherapy once he is recovered from his hospitalization as an outpatient -Patient had surgical intervention with [facility] in 2018 -Patient primarily seen oncology care at [facility] but has also seen Dr. Marin Olp in the past -Continue pain control with IV morphine -Dr. Marin Olp is following here and does not feel as if his obstruction is cancer related   Bowel/Colonic Obstruction in the setting of Sigmoid Colon Stricture Abdominal Pain Constipation -Patient was admitted to telemetry inpatient -General Surgery was consulted and appreciate further evaluation and assistance; they recommended bowel rest and IV antibiotics and recommending GI consult to evaluate the colon further -Continue IV Zosyn -Gastroenterology evaluating and reviewed his scans which showed pneumatosis of the cecum and proximal ascending colon -GI took the patient for EGD and flexible sigmoidoscopy today; patient's EGD showed esophagitis, gastritis and stenosis and inflammation in the third and fourth portion of the duodenum and biopsies were taken.  His flexible sigmoidoscopy was extremely difficult and Dr. Alessandra Bevels was not able to advance the scope beyond the sigmoid colon stricture and there is some associated inflammation.  The  scope was changed to ultraslin without any success so she recommends full liquid diet today continue fleets enemas and consider barium enema tomorrow and follow with biopsies -They feel the patient would be high risk for full  colonoscopy given his pneumatosis -GI recommended full liquid diet yesterday and advanced to Soft -Currently awaiting biopsy results from the flex sig -GI was going to plan for a barium enema today however Dr. Alessandra Bevels had multiple discussions with the radiologist regarding a barium enema as well as Gastrografin enema.  According to radiologist Dr. Jacalyn Lefevre the patient will be at high risk for complication from the barium enema because of his recent pneumatosis seen on CT scan and the recommendation from the radiologist was to repeat the CT scan without contrast over the weekend and possibly do a Gastrografin enema on Monday if resolution of pneumatosis is seen on the repeat CT scan -Appreciate GI evaluation and they are going to advance the diet to soft  Obstructing Right Ureteral Stone -He is currently asymptomatic from his right ureteral stone and will need to follow-up with urology in outpatient setting given that Dr. Marylyn Ishihara spoke with urology and they felt that the patient can follow-up in outpatient if his UA was normal and his pain was controlled; UA is relatively unremarkable but he does have an AKI as below -May need urology input as an inpatient if creatinine continues to worsen however is improving now  Hypophosphatemia -Patient's phosphorus level has gone from 1.9 - > 2.8 -Replete with po K Phos NAK -Continue to Monitor and Trend  -Repeat Phos Level in the AM   Thrombocytosis  -Patient's Platelet Count has gone from 514 -> 596 -> 788 -> 655 -Continue to Monitor and Trend -Repeat CBC in the AM  Normocytic Anemia -Patient hemoglobin/hematocrit has dropped from 14.9/43.9 on admission and is now 11.3/35.2 -Possibly dilutional drop  -Check anemia panel in the a.m. -We will continue to monitor for signs and symptoms of bleeding; currently no overt bleeding noted -Repeat CBC in a.m.  AKI Metabolic Acidosis -Patient's BUNs/creatinine went from 26/1.71 -> 9/0.70 -> 44/2.00 -> 31/1.95 ->  19/1.60 -The patient's acidosis has improved and he now has a CO2 of 23, anion gap of 7, chloride level of 105 -We will continue IV fluid hydration with D5 normal saline at 75 MLS per hour -Avoid further nephrotoxic medications, contrast dyes, hypotension and renally adjust medications  -Repeat CMP in a.m.  DVT prophylaxis: SCDs Code Status: Partial Code  Family Communication: No family present at bedside Disposition Plan: Pending Further Clearance by GI and General Surgery   Status is: Inpatient  Remains inpatient appropriate because:Unsafe d/c plan, IV treatments appropriate due to intensity of illness or inability to take PO and Inpatient level of care appropriate due to severity of illness   Dispo: The patient is from: Home              Anticipated d/c is to: Home              Patient currently is not medically stable to d/c.   Difficult to place patient No  Consultants:   Medical Oncology  General Surgery  Gastroenterology   Procedures:  EGD  Findings:      LA Grade C (one or more mucosal breaks continuous between tops of 2 or       more mucosal folds, less than 75% circumference) esophagitis with no       bleeding was found in the mid and distal esophagus.  Biopsies were taken       with a cold forceps for histology.      A small hiatal hernia was present.      Scattered severe inflammation characterized by congestion (edema),       erosions, erythema and friability was found in the gastric body and in       the gastric antrum. Biopsies were taken with a cold forceps for       histology.      The cardia and gastric fundus were normal on retroflexion.      An acquired mild stenosis was found in the third and fourth portion of       the duodenum with inflmation and was traversed. Biopsies were taken with       a cold forceps for histology. Impression:               - LA Grade C esophagitis with no bleeding. Biopsied.                           - Gastritis. Biopsied.                            - Acquired duodenal stenosis. Biopsied.  FLEX SIGMOIDOSCOPY Findings:      The perianal and digital rectal examinations were normal.      A severe stenosis was found in the sigmoid colon and was non-traversed.       Biopsies were taken with a cold forceps for histology.There was       significant tortuosity and narrowing in the sigmoid colon at around 30       to 35 cm. Not able to advance pediatric colonoscopy or ultraslim scope       despite abdominal pressure and changing patient's position to supine       position. There was some inflammation and erythema at this area as well       as some inflammation in the rectosigmoid colon. Biopsies were taken.      Multiple small and large-mouthed diverticula were found in the sigmoid       colon.      Internal hemorrhoids were found during retroflexion. The hemorrhoids       were medium-sized. Impression:               - Stricture in the sigmoid colon. Biopsied.                           - Diverticulosis in the sigmoid colon.                           - Internal hemorrhoids. Moderate Sedation:      Moderate (conscious) sedation was personally administered by an       anesthesia professional. The following parameters were monitored: oxygen       saturation, heart rate, blood pressure, and response to care. Recommendation:           - Return patient to hospital ward for ongoing care.                           - Full liquid diet.                           -  Await pathology results.                           - Perform a barium enema tomorrow.   Antimicrobials:  Anti-infectives (From admission, onward)   Start     Dose/Rate Route Frequency Ordered Stop   05/18/21 1300  piperacillin-tazobactam (ZOSYN) IVPB 3.375 g        3.375 g 12.5 mL/hr over 240 Minutes Intravenous Every 8 hours 05/18/21 1216     05/18/21 0330  piperacillin-tazobactam (ZOSYN) IVPB 3.375 g        3.375 g 100 mL/hr over 30 Minutes Intravenous  Once  05/18/21 0326 05/18/21 0452        Subjective: Seen and examined at bedside and states that he is having liquidy bowel movements now.  No nausea or vomiting.  Abdominal pain is a little bit better.  Is passing flatus.  No chest pain or shortness of breath.  No other concerns or complaints at this time.  Objective: Vitals:   05/20/21 1410 05/20/21 1418 05/20/21 2148 05/21/21 0606  BP: (!) 151/87 (!) 149/97 135/81 125/82  Pulse: 64 67 (!) 58 61  Resp: 17 20 14 16   Temp:   98.3 F (36.8 C) 97.7 F (36.5 C)  TempSrc:   Oral Oral  SpO2: 95% 95% 92% 96%  Weight:      Height:       No intake or output data in the 24 hours ending 05/21/21 1240 Filed Weights   05/18/21 0125  Weight: 74.4 kg   Examination: Physical Exam:  Constitutional: The patient is a thin elderly Caucasian male currently in no acute distress appears calm Eyes: Lids and conjunctivae normal, sclerae anicteric  ENMT: External Ears, Nose appear normal. Grossly normal hearing.  Neck: Appears normal, supple, no cervical masses, normal ROM, no appreciable thyromegaly; no JVD Respiratory: Diminished to auscultation bilaterally, no wheezing, rales, rhonchi or crackles. Normal respiratory effort and patient is not tachypenic. No accessory muscle use.  Cardiovascular: RRR, has a slight systolic 2/6 murmur. No extremity edema.  Abdomen: Soft, minimally-tender, distended slightly. Bowel sounds positive.  GU: Deferred. Musculoskeletal: No clubbing / cyanosis of digits/nails. No joint deformity upper and lower extremities.  Skin: No rashes, lesions, ulcers on to skin evaluation. No induration; Warm and dry.  Neurologic: CN 2-12 grossly intact with no focal deficits. Romberg sign and cerebellar reflexes not assessed.  Psychiatric: Normal judgment and insight. Alert and oriented x 3. Normal mood and appropriate affect.   Data Reviewed: I have personally reviewed following labs and imaging studies  CBC: Recent Labs  Lab  05/18/21 0315 05/19/21 0513 05/20/21 0534 05/21/21 0609  WBC 6.1 6.3 7.1 5.9  NEUTROABS 4.1  --  3.9 2.8  HGB 14.9 13.3 13.4 11.3*  HCT 43.9 40.5 41.1 35.2*  MCV 95.2 98.1 98.1 98.9  PLT 514* 596* 788* 024*   Basic Metabolic Panel: Recent Labs  Lab 05/18/21 0315 05/19/21 0513 05/20/21 0534 05/21/21 0609  NA 135 135 135 138  K 3.7 4.5 4.7 4.0  CL 105 107 105 109  CO2 21* 19* 23 23  GLUCOSE 145* 100* 125* 125*  BUN 9 44* 31* 19  CREATININE 0.70 2.00* 1.95* 1.60*  CALCIUM 8.8* 8.2* 8.6* 7.9*  MG  --   --  2.0 1.9  PHOS  --   --  1.9* 2.8   GFR: Estimated Creatinine Clearance: 39.4 mL/min (A) (by C-G formula based on SCr of  1.6 mg/dL (H)). Liver Function Tests: Recent Labs  Lab 05/18/21 0315 05/19/21 0513 05/20/21 0534 05/21/21 0609  AST 20 26 20 16   ALT 13 28 23 18   ALKPHOS 36* 54 54 43  BILITOT 0.7 0.6 0.3 0.3  PROT 7.0 6.4* 6.3* 5.4*  ALBUMIN 4.0 3.2* 3.2* 2.7*   Recent Labs  Lab 05/18/21 0315  LIPASE 23   No results for input(s): AMMONIA in the last 168 hours. Coagulation Profile: No results for input(s): INR, PROTIME in the last 168 hours. Cardiac Enzymes: No results for input(s): CKTOTAL, CKMB, CKMBINDEX, TROPONINI in the last 168 hours. BNP (last 3 results) No results for input(s): PROBNP in the last 8760 hours. HbA1C: No results for input(s): HGBA1C in the last 72 hours. CBG: No results for input(s): GLUCAP in the last 168 hours. Lipid Profile: No results for input(s): CHOL, HDL, LDLCALC, TRIG, CHOLHDL, LDLDIRECT in the last 72 hours. Thyroid Function Tests: No results for input(s): TSH, T4TOTAL, FREET4, T3FREE, THYROIDAB in the last 72 hours. Anemia Panel: No results for input(s): VITAMINB12, FOLATE, FERRITIN, TIBC, IRON, RETICCTPCT in the last 72 hours. Sepsis Labs: Recent Labs  Lab 05/18/21 0349  LATICACIDVEN 1.2    Recent Results (from the past 240 hour(s))  Resp Panel by RT-PCR (Flu A&B, Covid) Nasopharyngeal Swab     Status: None    Collection Time: 05/18/21  3:49 AM   Specimen: Nasopharyngeal Swab; Nasopharyngeal(NP) swabs in vial transport medium  Result Value Ref Range Status   SARS Coronavirus 2 by RT PCR NEGATIVE NEGATIVE Final    Comment: (NOTE) SARS-CoV-2 target nucleic acids are NOT DETECTED.  The SARS-CoV-2 RNA is generally detectable in upper respiratory specimens during the acute phase of infection. The lowest concentration of SARS-CoV-2 viral copies this assay can detect is 138 copies/mL. A negative result does not preclude SARS-Cov-2 infection and should not be used as the sole basis for treatment or other patient management decisions. A negative result may occur with  improper specimen collection/handling, submission of specimen other than nasopharyngeal swab, presence of viral mutation(s) within the areas targeted by this assay, and inadequate number of viral copies(<138 copies/mL). A negative result must be combined with clinical observations, patient history, and epidemiological information. The expected result is Negative.  Fact Sheet for Patients:  EntrepreneurPulse.com.au  Fact Sheet for Healthcare Providers:  IncredibleEmployment.be  This test is no t yet approved or cleared by the [place] FDA and  has been authorized for detection and/or diagnosis of SARS-CoV-2 by FDA under an Emergency Use Authorization (EUA). This EUA will remain  in effect (meaning this test can be used) for the duration of the COVID-19 declaration under Section 564(b)(1) of the Act, 21 U.S.C.section 360bbb-3(b)(1), unless the authorization is terminated  or revoked sooner.       Influenza A by PCR NEGATIVE NEGATIVE Final   Influenza B by PCR NEGATIVE NEGATIVE Final    Comment: (NOTE) The Xpert Xpress SARS-CoV-2/FLU/RSV plus assay is intended as an aid in the diagnosis of influenza from Nasopharyngeal swab specimens and should not be used as a sole basis for treatment.  Nasal washings and aspirates are unacceptable for Xpert Xpress SARS-CoV-2/FLU/RSV testing.  Fact Sheet for Patients: EntrepreneurPulse.com.au  Fact Sheet for Healthcare Providers: IncredibleEmployment.be  This test is not yet approved or cleared by the [place] FDA and has been authorized for detection and/or diagnosis of SARS-CoV-2 by FDA under an Emergency Use Authorization (EUA). This EUA will remain in effect (meaning this test can be  used) for the duration of the COVID-19 declaration under Section 564(b)(1) of the Act, 21 U.S.C. section 360bbb-3(b)(1), unless the authorization is terminated or revoked.  Performed at [facility], [address] [place], [place] [address]     RN Pressure Injury Documentation:     Estimated body mass index is 22.88 kg/m as calculated from the following:   Height as of this encounter: 5\' 11"  (1.803 m).   Weight as of this encounter: 74.4 kg.  Malnutrition Type: Nutrition Problem: Increased nutrient needs Etiology: cancer and cancer related treatments Malnutrition Characteristics: Signs/Symptoms: estimated needs Nutrition Interventions: Interventions: Ensure Enlive (each supplement provides 350kcal and 20 grams of protein),Magic cup,MVI Radiology Studies: No results found. Scheduled Meds: . Chlorhexidine Gluconate Cloth  6 each Topical Daily  . feeding supplement  237 mL Oral TID BM  . multivitamin with minerals  1 tablet Oral Daily  . potassium & sodium phosphates  1 packet Oral TID WC & HS  . sodium phosphate  1 enema Rectal Once   Continuous Infusions: . dextrose 5 % and 0.9% NaCl 75 mL/hr at 05/19/21 1133  . piperacillin-tazobactam (ZOSYN)  IV 3.375 g (05/21/21 0557)    LOS: 3 days   Kerney Elbe, DO [facility] PAGER is on AMION  If 7PM-7AM, please contact night-coverage [email or web address]

## 2021-05-22 LAB — PHOSPHORUS: Phosphorus: 2.5 mg/dL (ref 2.5–4.6)

## 2021-05-22 LAB — CBC WITH DIFFERENTIAL/PLATELET
Band Neutrophils: 3 %
Basophils Absolute: 0 10*3/uL (ref 0.0–0.1)
Basophils Relative: 2 %
Blasts: NONE SEEN %
Eosinophils Absolute: 0.3 10*3/uL (ref 0.0–0.5)
Eosinophils Relative: 4 %
HCT: 35.6 % — ABNORMAL LOW (ref 39.0–52.0)
Hemoglobin: 11.6 g/dL — ABNORMAL LOW (ref 13.0–17.0)
Lymphocytes Relative: 30 %
Lymphs Abs: 2.4 10*3/uL (ref 0.7–4.0)
MCH: 32 pg (ref 26.0–34.0)
MCHC: 32.6 g/dL (ref 30.0–36.0)
MCV: 98.3 fL (ref 80.0–100.0)
Metamyelocytes Relative: NONE SEEN %
Monocytes Absolute: 1.2 10*3/uL — ABNORMAL HIGH (ref 0.1–1.0)
Monocytes Relative: 7 %
Myelocytes: NONE SEEN %
Neutro Abs: 3.1 10*3/uL (ref 1.7–7.7)
Neutrophils Relative %: 54 %
Platelets: 753 10*3/uL — ABNORMAL HIGH (ref 150–400)
Promyelocytes Relative: NONE SEEN %
RBC Morphology: NORMAL
RBC: 3.62 MIL/uL — ABNORMAL LOW (ref 4.22–5.81)
RDW: 14.3 % (ref 11.5–15.5)
WBC Morphology: NORMAL
WBC: 6.9 10*3/uL (ref 4.0–10.5)
nRBC: 0 % (ref 0.0–0.2)
nRBC: NONE SEEN /100 WBC

## 2021-05-22 LAB — COMPREHENSIVE METABOLIC PANEL
ALT: 18 U/L (ref 0–44)
AST: 22 U/L (ref 15–41)
Albumin: 2.7 g/dL — ABNORMAL LOW (ref 3.5–5.0)
Alkaline Phosphatase: 44 U/L (ref 38–126)
Anion gap: 6 (ref 5–15)
BUN: 13 mg/dL (ref 8–23)
CO2: 25 mmol/L (ref 22–32)
Calcium: 8 mg/dL — ABNORMAL LOW (ref 8.9–10.3)
Chloride: 109 mmol/L (ref 98–111)
Creatinine, Ser: 1.42 mg/dL — ABNORMAL HIGH (ref 0.61–1.24)
GFR, Estimated: 50 mL/min — ABNORMAL LOW (ref >60)
Glucose, Bld: 119 mg/dL — ABNORMAL HIGH (ref 70–99)
Potassium: 3.7 mmol/L (ref 3.5–5.1)
Sodium: 140 mmol/L (ref 135–145)
Total Bilirubin: 0.2 mg/dL — ABNORMAL LOW (ref 0.3–1.2)
Total Protein: 5.3 g/dL — ABNORMAL LOW (ref 6.5–8.1)

## 2021-05-22 LAB — MAGNESIUM: Magnesium: 1.8 mg/dL (ref 1.7–2.4)

## 2021-05-22 NOTE — Progress Notes (Signed)
PT Cancellation Note  Patient Details Name: Kevin Villegas MRN: [id number] DOB: 1941-12-05   Cancelled Treatment:    Reason Eval/Treat Not Completed: PT screened, no needs identified, will sign off. Pt reports he has been walking the halls which nurse confirmed.  Pt is aware that if his status changes he can always let the MD know if he feels he needs [place]. Thank you for the referral.   Galen Manila 05/22/2021, 11:27 AM

## 2021-05-22 NOTE — Progress Notes (Signed)
PROGRESS NOTE    Kevin Villegas  ZOX:[id number] DOB: June 28, 1941 DOA: 05/18/2021 PCP: Dettinger, [name], MD   Brief Narrative:  The patient is a 80 year old Caucasian male with a past medical history significant for but not limited to pancreatic cancer and other comorbidities who presented with abdominal pain started 2 nights prior to admission.  Patient states that he had severe sharp lower abdominal pain and described as an 8 out of 10 in severity and stated that it came in 32nd episode.  He reports that he had a weeks worth of constipation prior to this.  He went to the ED and was given laxatives and went home with some improvement in his symptoms.  He states that late yesterday he developed a new abdominal pain and this was epigastric rating to her back.  He described it like a sharp knife type sensation and states it was worsened with food.  He felt as if he had pancreatitis again so he came to the ED for help.  In the ED he had normal lab work but CT of the abdomen pelvis showed progressive metastatic disease with new and enlarging pulmonary nodules and enlarging mesenteric soft tissue mass at the duodenal jejunal junction as well as distention of the distal small bowel and colon which changes in caliber at a thick-walled segment of the proximal sigmoid colon as well as distal colonic obstruction that could not be excluded.  There was also pneumatosis of the cecum and proximal ascending colon that had a differential of bowel ischemia or infection as well as right-sided obstructive uropathy due to an 8 mm obstructing distal right ureteral calculus.  General surgery was consulted as well as gastroenterology as well as medical oncology.  The  patient underwent an EGD and flex sigmoidoscopy.  The plan was to have the patient have another CT scan of the abdomen pelvis with oral contrast however after further discussion the gastroenterologist spoke with the radiologist Dr. Ilda Foil and it was felt that the CT  with oral contrast would not likely be very helpful and the best way to assess the stricture would be likely the Gastrografin enema and this will be done on Monday, 05/24/2021.  Assessment & Plan:   Active Problems:   Colon obstruction (HCC)  Metastatic Pancreatic Cancer -CT of the abdomen pelvis done and showed progressive metastatic disease with new and enlarging pulmonary nodules as well as enlarging mesenteric soft tissue mass at the duodenal jejunal junction as well as the distal tension of the distal small bowel and colon -Medical oncology was consulted and patient had recently been started on systemic chemotherapy with gemcitabine and Abraxane and tolerated his first cycle overall with exception of constipation -Medical oncology feels that that his disease progression occurred prior to the chemotherapy starting and recommend for him to resume chemotherapy once he is recovered from his hospitalization as an outpatient -Patient had surgical intervention with [facility] in 2018 -Patient primarily seen oncology care at [facility] but has also seen Dr. Marin Olp in the past -Continue pain control with IV morphine -Dr. Marin Olp is following here and does not feel as if his obstruction is cancer related   Bowel/Colonic Obstruction in the setting of Sigmoid Colon Stricture Abdominal Pain Constipation -Patient was admitted to telemetry inpatient -General Surgery was consulted and appreciate further evaluation and assistance; they recommended bowel rest and IV antibiotics and recommending GI consult to evaluate the colon further -Continue IV Zosyn -Gastroenterology evaluating and reviewed his scans which showed pneumatosis of the cecum  and proximal ascending colon -GI took the patient for EGD and flexible sigmoidoscopy today; patient's EGD showed esophagitis, gastritis and stenosis and inflammation in the third and fourth portion of the duodenum and biopsies were taken.  His flexible sigmoidoscopy was extremely  difficult and Dr. Alessandra Bevels was not able to advance the scope beyond the sigmoid colon stricture and there is some associated inflammation.  The scope was changed to ultraslin without any success so she recommends full liquid diet today continue fleets enemas and consider barium enema tomorrow and follow with biopsies -They feel the patient would be high risk for full colonoscopy given his pneumatosis -GI recommended full liquid diet yesterday and advanced to Soft yesterday but now this has been changed back to full given GI recommendations today -Currently awaiting biopsy results from the flex sig -GI was going to plan for a barium enema yesterday however Dr. Alessandra Bevels had multiple discussions with the radiologist regarding a barium enema as well as Gastrografin enema.  According to radiologist Dr. Jacalyn Lefevre the patient will be at high risk for complication from the barium enema because of his recent pneumatosis seen on CT scan and the recommendation from the radiologist was to repeat the CT scan without contrast over the weekend and possibly do a Gastrografin enema on Monday if resolution of pneumatosis is seen on the repeat CT scan; after further discussion Dr. Elsworth Soho spoke with Dr. Ilda Foil and it was felt that the CT scan with oral contrast will not likely be helpful and the best way to assess the stricture would likely be the Gastrografin enema and this will likely happen on Monday, 05/24/2021 and the study will help assess the feasibility/need for colonic stenting -Appreciate GI evaluation and they they were going to advance the diet to soft diet however GI now recommends remaining on a full code diet  Obstructing Right Ureteral Stone -He is currently asymptomatic from his right ureteral stone and will need to follow-up with urology in outpatient setting given that Dr. Marylyn Ishihara spoke with urology and they felt that the patient can follow-up in outpatient if his UA was normal and his pain was controlled; UA is  relatively unremarkable but he does have an AKI as below -May need urology input as an inpatient if creatinine continues to worsen however is improving now  Hypophosphatemia -Patient's phosphorus level has gone from 1.9 - > 2.8 -> 2.5 -Replete with po K Phos NAK -Continue to Monitor and Trend  -Repeat Phos Level in the AM   Thrombocytosis  -Patient's Platelet Count has gone from 514 -> 596 -> 788 -> 655 -> 753 -Continue to Monitor and Trend -Repeat CBC in the AM  Normocytic Anemia -Patient hemoglobin/hematocrit has dropped from 14.9/43.9 on admission and is now 11.6/35.6 -Possibly dilutional drop  -Check anemia panel in the a.m. -We will continue to monitor for signs and symptoms of bleeding; currently no overt bleeding noted -Repeat CBC in a.m.  AKI Metabolic Acidosis -Patient's BUNs/creatinine went from 26/1.71 -> 9/0.70 -> 44/2.00 -> 31/1.95 -> 19/1.60 and today it is 13/1.42 -The patient's acidosis has improved and he now has a CO2 of 25, anion gap of 6, chloride level of 109 -We will continue IV fluid hydration with D5 normal saline at 75 MLS per hour -Avoid further nephrotoxic medications, contrast dyes, hypotension and renally adjust medications  -Repeat CMP in a.m.  DVT prophylaxis: SCDs Code Status: Partial Code  Family Communication: No family present at bedside Disposition Plan: Pending Further Clearance by GI and General  Surgery   Status is: Inpatient  Remains inpatient appropriate because:Unsafe d/c plan, IV treatments appropriate due to intensity of illness or inability to take PO and Inpatient level of care appropriate due to severity of illness   Dispo: The patient is from: Home              Anticipated d/c is to: Home              Patient currently is not medically stable to d/c.   Difficult to place patient No  Consultants:   Medical Oncology  General Surgery  Gastroenterology   Procedures:  EGD  Findings:      LA Grade C (one or more mucosal  breaks continuous between tops of 2 or       more mucosal folds, less than 75% circumference) esophagitis with no       bleeding was found in the mid and distal esophagus. Biopsies were taken       with a cold forceps for histology.      A small hiatal hernia was present.      Scattered severe inflammation characterized by congestion (edema),       erosions, erythema and friability was found in the gastric body and in       the gastric antrum. Biopsies were taken with a cold forceps for       histology.      The cardia and gastric fundus were normal on retroflexion.      An acquired mild stenosis was found in the third and fourth portion of       the duodenum with inflmation and was traversed. Biopsies were taken with       a cold forceps for histology. Impression:               - LA Grade C esophagitis with no bleeding. Biopsied.                           - Gastritis. Biopsied.                           - Acquired duodenal stenosis. Biopsied.  FLEX SIGMOIDOSCOPY Findings:      The perianal and digital rectal examinations were normal.      A severe stenosis was found in the sigmoid colon and was non-traversed.       Biopsies were taken with a cold forceps for histology.There was       significant tortuosity and narrowing in the sigmoid colon at around 30       to 35 cm. Not able to advance pediatric colonoscopy or ultraslim scope       despite abdominal pressure and changing patient's position to supine       position. There was some inflammation and erythema at this area as well       as some inflammation in the rectosigmoid colon. Biopsies were taken.      Multiple small and large-mouthed diverticula were found in the sigmoid       colon.      Internal hemorrhoids were found during retroflexion. The hemorrhoids       were medium-sized. Impression:               - Stricture in the sigmoid colon. Biopsied.                           -  Diverticulosis in the sigmoid colon.                            - Internal hemorrhoids. Moderate Sedation:      Moderate (conscious) sedation was personally administered by an       anesthesia professional. The following parameters were monitored: oxygen       saturation, heart rate, blood pressure, and response to care. Recommendation:           - Return patient to hospital ward for ongoing care.                           - Full liquid diet.                           - Await pathology results.                           - Perform a barium enema tomorrow.   Antimicrobials:  Anti-infectives (From admission, onward)   Start     Dose/Rate Route Frequency Ordered Stop   05/18/21 1300  piperacillin-tazobactam (ZOSYN) IVPB 3.375 g        3.375 g 12.5 mL/hr over 240 Minutes Intravenous Every 8 hours 05/18/21 1216     05/18/21 0330  piperacillin-tazobactam (ZOSYN) IVPB 3.375 g        3.375 g 100 mL/hr over 30 Minutes Intravenous  Once 05/18/21 0326 05/18/21 0452        Subjective: Seen and examined at bedside and states that he is feeling the best he has in a while.  No nausea or vomiting.  Abdominal pain is better.  Denies any lightheadedness or dizziness.  No other concerns or complaints at this time.  Objective: Vitals:   05/21/21 0606 05/21/21 1506 05/21/21 2204 05/22/21 0435  BP: 125/82 (!) 177/100 (!) 145/83 (!) 147/94  Pulse: 61 60 64 (!) 59  Resp: 16 16 16 16   Temp: 97.7 F (36.5 C) 98.4 F (36.9 C) 98.2 F (36.8 C) 98 F (36.7 C)  TempSrc: Oral Oral Oral Oral  SpO2: 96% 98% 97% 95%  Weight:      Height:        Intake/Output Summary (Last 24 hours) at 05/22/2021 1222 Last data filed at 05/21/2021 1542 Gross per 24 hour  Intake 694.02 ml  Output --  Net 694.02 ml   Filed Weights   05/18/21 0125  Weight: 74.4 kg   Examination: Physical Exam:  Constitutional: The patient is a thin elderly Caucasian male currently in no acute distress appears calm and comfortable Eyes: Lids and conjunctivae normal, sclerae anicteric   ENMT: External Ears, Nose appear normal. Grossly normal hearing.  Neck: Appears normal, supple, no cervical masses, normal ROM, no appreciable thyromegaly, no JVD Respiratory: Diminished to auscultation bilaterally, no wheezing, rales, rhonchi or crackles. Normal respiratory effort and patient is not tachypenic. No accessory muscle use.  Unlabored breathing Cardiovascular: RRR, is a 2/6 systolic murmur. No extremity edema.   Abdomen: Soft, non-tender, very slightly distended. Bowel sounds positive.  GU: Deferred. Musculoskeletal: No clubbing / cyanosis of digits/nails. No joint deformity upper and lower extremities.  Skin: No rashes, lesions, ulcers on limited skin evaluation. No induration; Warm and dry.  Neurologic: CN 2-12 grossly intact with no focal deficits. Romberg sign and cerebellar reflexes not assessed.  Psychiatric: Normal judgment and insight. Alert and oriented x 3. Normal mood and appropriate affect.   Data Reviewed: I have personally reviewed following labs and imaging studies  CBC: Recent Labs  Lab 05/18/21 0315 05/19/21 0513 05/20/21 0534 05/21/21 0609 05/22/21 0444  WBC 6.1 6.3 7.1 5.9 6.9  NEUTROABS 4.1  --  3.9 2.8 3.1  HGB 14.9 13.3 13.4 11.3* 11.6*  HCT 43.9 40.5 41.1 35.2* 35.6*  MCV 95.2 98.1 98.1 98.9 98.3  PLT 514* 596* 788* 655* 591*   Basic Metabolic Panel: Recent Labs  Lab 05/18/21 0315 05/19/21 0513 05/20/21 0534 05/21/21 0609 05/22/21 0444  NA 135 135 135 138 140  K 3.7 4.5 4.7 4.0 3.7  CL 105 107 105 109 109  CO2 21* 19* 23 23 25   GLUCOSE 145* 100* 125* 125* 119*  BUN 9 44* 31* 19 13  CREATININE 0.70 2.00* 1.95* 1.60* 1.42*  CALCIUM 8.8* 8.2* 8.6* 7.9* 8.0*  MG  --   --  2.0 1.9 1.8  PHOS  --   --  1.9* 2.8 2.5   GFR: Estimated Creatinine Clearance: 44.4 mL/min (A) (by C-G formula based on SCr of 1.42 mg/dL (H)). Liver Function Tests: Recent Labs  Lab 05/18/21 0315 05/19/21 0513 05/20/21 0534 05/21/21 0609 05/22/21 0444  AST  20 26 20 16 22   ALT 13 28 23 18 18   ALKPHOS 36* 54 54 43 44  BILITOT 0.7 0.6 0.3 0.3 0.2*  PROT 7.0 6.4* 6.3* 5.4* 5.3*  ALBUMIN 4.0 3.2* 3.2* 2.7* 2.7*   Recent Labs  Lab 05/18/21 0315  LIPASE 23   No results for input(s): AMMONIA in the last 168 hours. Coagulation Profile: No results for input(s): INR, PROTIME in the last 168 hours. Cardiac Enzymes: No results for input(s): CKTOTAL, CKMB, CKMBINDEX, TROPONINI in the last 168 hours. BNP (last 3 results) No results for input(s): PROBNP in the last 8760 hours. HbA1C: No results for input(s): HGBA1C in the last 72 hours. CBG: No results for input(s): GLUCAP in the last 168 hours. Lipid Profile: No results for input(s): CHOL, HDL, LDLCALC, TRIG, CHOLHDL, LDLDIRECT in the last 72 hours. Thyroid Function Tests: No results for input(s): TSH, T4TOTAL, FREET4, T3FREE, THYROIDAB in the last 72 hours. Anemia Panel: No results for input(s): VITAMINB12, FOLATE, FERRITIN, TIBC, IRON, RETICCTPCT in the last 72 hours. Sepsis Labs: Recent Labs  Lab 05/18/21 0349  LATICACIDVEN 1.2    Recent Results (from the past 240 hour(s))  Resp Panel by RT-PCR (Flu A&B, Covid) Nasopharyngeal Swab     Status: None   Collection Time: 05/18/21  3:49 AM   Specimen: Nasopharyngeal Swab; Nasopharyngeal(NP) swabs in vial transport medium  Result Value Ref Range Status   SARS Coronavirus 2 by RT PCR NEGATIVE NEGATIVE Final    Comment: (NOTE) SARS-CoV-2 target nucleic acids are NOT DETECTED.  The SARS-CoV-2 RNA is generally detectable in upper respiratory specimens during the acute phase of infection. The lowest concentration of SARS-CoV-2 viral copies this assay can detect is 138 copies/mL. A negative result does not preclude SARS-Cov-2 infection and should not be used as the sole basis for treatment or other patient management decisions. A negative result may occur with  improper specimen collection/handling, submission of specimen other than  nasopharyngeal swab, presence of viral mutation(s) within the areas targeted by this assay, and inadequate number of viral copies(<138 copies/mL). A negative result must be combined with clinical observations, patient history, and epidemiological information. The expected result is Negative.  Fact Sheet  for Patients:  EntrepreneurPulse.com.au  Fact Sheet for Healthcare Providers:  IncredibleEmployment.be  This test is no t yet approved or cleared by the [place] FDA and  has been authorized for detection and/or diagnosis of SARS-CoV-2 by FDA under an Emergency Use Authorization (EUA). This EUA will remain  in effect (meaning this test can be used) for the duration of the COVID-19 declaration under Section 564(b)(1) of the Act, 21 U.S.C.section 360bbb-3(b)(1), unless the authorization is terminated  or revoked sooner.       Influenza A by PCR NEGATIVE NEGATIVE Final   Influenza B by PCR NEGATIVE NEGATIVE Final    Comment: (NOTE) The Xpert Xpress SARS-CoV-2/FLU/RSV plus assay is intended as an aid in the diagnosis of influenza from Nasopharyngeal swab specimens and should not be used as a sole basis for treatment. Nasal washings and aspirates are unacceptable for Xpert Xpress SARS-CoV-2/FLU/RSV testing.  Fact Sheet for Patients: EntrepreneurPulse.com.au  Fact Sheet for Healthcare Providers: IncredibleEmployment.be  This test is not yet approved or cleared by the [place] FDA and has been authorized for detection and/or diagnosis of SARS-CoV-2 by FDA under an Emergency Use Authorization (EUA). This EUA will remain in effect (meaning this test can be used) for the duration of the COVID-19 declaration under Section 564(b)(1) of the Act, 21 U.S.C. section 360bbb-3(b)(1), unless the authorization is terminated or revoked.  Performed at [facility], [address] [place], [place]  [address]     RN Pressure Injury Documentation:     Estimated body mass index is 22.88 kg/m as calculated from the following:   Height as of this encounter: 5\' 11"  (1.803 m).   Weight as of this encounter: 74.4 kg.  Malnutrition Type: Nutrition Problem: Increased nutrient needs Etiology: cancer and cancer related treatments Malnutrition Characteristics: Signs/Symptoms: estimated needs Nutrition Interventions: Interventions: Ensure Enlive (each supplement provides 350kcal and 20 grams of protein),Magic cup,MVI Radiology Studies: No results found. Scheduled Meds: . Chlorhexidine Gluconate Cloth  6 each Topical Daily  . feeding supplement  237 mL Oral TID BM  . multivitamin with minerals  1 tablet Oral Daily  . potassium & sodium phosphates  1 packet Oral TID WC & HS  . sodium phosphate  1 enema Rectal Once   Continuous Infusions: . dextrose 5 % and 0.9% NaCl 75 mL/hr at 05/22/21 0445  . piperacillin-tazobactam (ZOSYN)  IV 3.375 g (05/22/21 0539)    LOS: 4 days   Kerney Elbe, DO [facility] PAGER is on AMION  If 7PM-7AM, please contact night-coverage [email or web address]

## 2021-05-22 NOTE — Plan of Care (Signed)
Case discussed with Dr. Ilda Foil (radiology).  After our discussion, it was felt that CT with oral contrast not likely to be very helpful, and best way to assess stricture likely gastrograffin enema.  Will plan on supportive care over the weekend, with gastrograffin enema on Monday.  This study will help assess feasibility/need for colonic stenting.  [facility] will revisit Monday.

## 2021-05-22 NOTE — Plan of Care (Signed)
Pt A&OX4, pt ambulated freely in hallway. Pt did mention some frustration with menu options for diet. BP (!) 166/89 (BP Location: Left Arm)   Pulse 61   Temp 97.7 F (36.5 C) (Oral)   Resp 20   Ht 5\' 11"  (1.803 m)   Wt 74.4 kg   SpO2 97%   BMI 22.88 kg/m  Pt had no c/o pain or gi distress. No other needs voiced at this time. Bed in lowest position, 2/4 bed rails up and call bell near by. Hal Neer

## 2021-05-22 NOTE — Progress Notes (Signed)
OT Cancellation Note  Patient Details Name: Kevin Villegas MRN: [id number] DOB: 24-Sep-1941   Cancelled Treatment:    Reason Eval/Treat Not Completed: OT screened, no needs identified, will sign off. Patient ambulating with nursing and able to perform ADLs.  Fernandez Kenley L Zayden Maffei 05/22/2021, 11:33 AM

## 2021-05-22 NOTE — Progress Notes (Signed)
Mr. Ryser seems to be doing relatively well right now.  He is still on a liquid diet.  GI is following closely.  It sounds like he will have a CT scan done over the weekend.  Then, from what he says, they will see about a stent to be placed at the stricture.  He has not had really much pain.  He has had no nausea or vomiting.  Again, he is on a liquid diet right now.  There is no fever.  He has had no bleeding.  There is no cough or shortness of breath.  On his physical, his vital signs are temperature 98.  Pulse 59.  Blood pressure 147/94.  His lungs are clear.  Cardiac exam regular rate and rhythm.  Abdomen is soft.  There is really no distention.  There may be a little bit of fullness over on the left side.  Bowel sounds are decreased.  There is no obvious fluid wave.  There is no palpable abdominal mass.  Again, we will have to see what can be done with respect to this stricture.  It is hard to say if this is a malignant stricture.  I think he has had biopsies taken which are pending.  Hopefully, a stent can be placed that we will be able to open up this area so he can eat and go to the bathroom and not have abdominal issues.  I know this is a complicated problem.  I do appreciate the great care he is getting from all the staff up on 6 E.  Lattie Haw, MD  Darlyn Chamber 17:14

## 2021-05-23 LAB — CBC WITH DIFFERENTIAL/PLATELET
Abs Immature Granulocytes: 0.04 10*3/uL (ref 0.00–0.07)
Basophils Absolute: 0.1 10*3/uL (ref 0.0–0.1)
Basophils Relative: 1 %
Eosinophils Absolute: 0.3 10*3/uL (ref 0.0–0.5)
Eosinophils Relative: 4 %
HCT: 35.3 % — ABNORMAL LOW (ref 39.0–52.0)
Hemoglobin: 11.3 g/dL — ABNORMAL LOW (ref 13.0–17.0)
Immature Granulocytes: 1 %
Lymphocytes Relative: 31 %
Lymphs Abs: 2.2 10*3/uL (ref 0.7–4.0)
MCH: 31.9 pg (ref 26.0–34.0)
MCHC: 32 g/dL (ref 30.0–36.0)
MCV: 99.7 fL (ref 80.0–100.0)
Monocytes Absolute: 1.2 10*3/uL — ABNORMAL HIGH (ref 0.1–1.0)
Monocytes Relative: 17 %
Neutro Abs: 3.4 10*3/uL (ref 1.7–7.7)
Neutrophils Relative %: 46 %
Platelets: 764 10*3/uL — ABNORMAL HIGH (ref 150–400)
RBC: 3.54 MIL/uL — ABNORMAL LOW (ref 4.22–5.81)
RDW: 14.5 % (ref 11.5–15.5)
WBC: 7.2 10*3/uL (ref 4.0–10.5)
nRBC: 0 % (ref 0.0–0.2)

## 2021-05-23 LAB — COMPREHENSIVE METABOLIC PANEL
ALT: 23 U/L (ref 0–44)
AST: 27 U/L (ref 15–41)
Albumin: 2.7 g/dL — ABNORMAL LOW (ref 3.5–5.0)
Alkaline Phosphatase: 39 U/L (ref 38–126)
Anion gap: 4 — ABNORMAL LOW (ref 5–15)
BUN: 10 mg/dL (ref 8–23)
CO2: 27 mmol/L (ref 22–32)
Calcium: 8.1 mg/dL — ABNORMAL LOW (ref 8.9–10.3)
Chloride: 109 mmol/L (ref 98–111)
Creatinine, Ser: 1.56 mg/dL — ABNORMAL HIGH (ref 0.61–1.24)
GFR, Estimated: 45 mL/min — ABNORMAL LOW (ref >60)
Glucose, Bld: 121 mg/dL — ABNORMAL HIGH (ref 70–99)
Potassium: 3.9 mmol/L (ref 3.5–5.1)
Sodium: 140 mmol/L (ref 135–145)
Total Bilirubin: 0.5 mg/dL (ref 0.3–1.2)
Total Protein: 5.4 g/dL — ABNORMAL LOW (ref 6.5–8.1)

## 2021-05-23 LAB — MAGNESIUM: Magnesium: 1.7 mg/dL (ref 1.7–2.4)

## 2021-05-23 LAB — PHOSPHORUS: Phosphorus: 2.6 mg/dL (ref 2.5–4.6)

## 2021-05-23 MED ORDER — HYDRALAZINE HCL 20 MG/ML IJ SOLN
5.0000 mg | Freq: Four times a day (QID) | INTRAMUSCULAR | Status: DC | PRN
  Administered 2021-05-23 – 2021-05-24 (×2): 5 mg via INTRAVENOUS
  Filled 2021-05-23 (×2): qty 1

## 2021-05-23 MED ORDER — MAGNESIUM SULFATE IN D5W 1-5 GM/100ML-% IV SOLN
1.0000 g | Freq: Once | INTRAVENOUS | Status: AC
  Administered 2021-05-23: 1 g via INTRAVENOUS
  Filled 2021-05-23: qty 100

## 2021-05-23 NOTE — Progress Notes (Signed)
PROGRESS NOTE    Kevin Villegas  PJA:[id number] DOB: 1941/10/21 DOA: 05/18/2021 PCP: Dettinger, [name], MD   Brief Narrative:  The patient is a 80 year old Caucasian male with a past medical history significant for but not limited to pancreatic cancer and other comorbidities who presented with abdominal pain started 2 nights prior to admission.  Patient states that he had severe sharp lower abdominal pain and described as an 8 out of 10 in severity and stated that it came in 32nd episode.  He reports that he had a weeks worth of constipation prior to this.  He went to the ED and was given laxatives and went home with some improvement in his symptoms.  He states that late yesterday he developed a new abdominal pain and this was epigastric rating to her back.  He described it like a sharp knife type sensation and states it was worsened with food.  He felt as if he had pancreatitis again so he came to the ED for help.  In the ED he had normal lab work but CT of the abdomen pelvis showed progressive metastatic disease with new and enlarging pulmonary nodules and enlarging mesenteric soft tissue mass at the duodenal jejunal junction as well as distention of the distal small bowel and colon which changes in caliber at a thick-walled segment of the proximal sigmoid colon as well as distal colonic obstruction that could not be excluded.  There was also pneumatosis of the cecum and proximal ascending colon that had a differential of bowel ischemia or infection as well as right-sided obstructive uropathy due to an 8 mm obstructing distal right ureteral calculus.  General surgery was consulted as well as gastroenterology as well as medical oncology.  The  patient underwent an EGD and flex sigmoidoscopy.  The plan was to have the patient have another CT scan of the abdomen pelvis with oral contrast however after further discussion the gastroenterologist spoke with the radiologist Dr. Ilda Foil and it was felt that the CT  with oral contrast would not likely be very helpful and the best way to assess the stricture would be likely the Gastrografin enema and this will be done on Monday, 05/24/2021.  Assessment & Plan:   Active Problems:   Colon obstruction (HCC)  Metastatic Pancreatic Cancer -CT of the abdomen pelvis done and showed progressive metastatic disease with new and enlarging pulmonary nodules as well as enlarging mesenteric soft tissue mass at the duodenal jejunal junction as well as the distal tension of the distal small bowel and colon -Medical oncology was consulted and patient had recently been started on systemic chemotherapy with gemcitabine and Abraxane and tolerated his first cycle overall with exception of constipation -Medical oncology feels that that his disease progression occurred prior to the chemotherapy starting and recommend for him to resume chemotherapy once he is recovered from his hospitalization as an outpatient -Patient had surgical intervention with [facility] in 2018 -Patient primarily seen oncology care at [facility] but has also seen Dr. Marin Olp in the past -Continue pain control with IV morphine -Dr. Marin Olp is following here and does not feel as if his obstruction is cancer related   Bowel/Colonic Obstruction in the setting of Sigmoid Colon Stricture Abdominal Pain Constipation -Patient was admitted to telemetry inpatient -General Surgery was consulted and appreciate further evaluation and assistance; they recommended bowel rest and IV antibiotics and recommending GI consult to evaluate the colon further -Continue IV Zosyn -Gastroenterology evaluating and reviewed his scans which showed pneumatosis of the cecum  and proximal ascending colon -GI took the patient for EGD and flexible sigmoidoscopy today; patient's EGD showed esophagitis, gastritis and stenosis and inflammation in the third and fourth portion of the duodenum and biopsies were taken.  His flexible sigmoidoscopy was extremely  difficult and Dr. Alessandra Bevels was not able to advance the scope beyond the sigmoid colon stricture and there is some associated inflammation.  The scope was changed to ultraslin without any success so she recommends full liquid diet today continue fleets enemas and consider barium enema tomorrow and follow with biopsies -They feel the patient would be high risk for full colonoscopy given his pneumatosis -GI recommended full liquid diet yesterday and advanced to Soft yesterday but now this has been changed back to full given GI recommendations today -Currently awaiting biopsy results from the flex sig -GI was going to plan for a barium enema yesterday however Dr. Alessandra Bevels had multiple discussions with the radiologist regarding a barium enema as well as Gastrografin enema.  According to radiologist Dr. Jacalyn Lefevre the patient will be at high risk for complication from the barium enema because of his recent pneumatosis seen on CT scan and the recommendation from the radiologist was to repeat the CT scan without contrast over the weekend and possibly do a Gastrografin enema on Monday if resolution of pneumatosis is seen on the repeat CT scan; after further discussion Dr. Elsworth Soho spoke with Dr. Ilda Foil and it was felt that the CT scan with oral contrast will not likely be helpful and the best way to assess the stricture would likely be the Gastrografin enema and this will likely happen on Monday, 05/24/2021 and the study will help assess the feasibility/need for colonic stenting -Appreciate GI evaluation and they they were going to advance the diet to soft diet however GI now recommends remaining on a full code diet  Obstructing Right Ureteral Stone -He is currently asymptomatic from his right ureteral stone and will need to follow-up with urology in outpatient setting given that Dr. Marylyn Ishihara spoke with urology and they felt that the patient can follow-up in outpatient if his UA was normal and his pain was controlled; UA is  relatively unremarkable but he does have an AKI as below -May need urology input as an inpatient if creatinine continues to worsen however is improving now  Hypophosphatemia -Patient's phosphorus level has gone from 1.9 - > 2.8 -> 2.5 -> 2.6 -Replete with po K Phos NAK -Continue to Monitor and Trend  -Repeat Phos Level in the AM   Thrombocytosis  -Patient's Platelet Count has gone from 514 -> 596 -> 788 -> 655 -> 753 -> 764 -Continue to Monitor and Trend -Repeat CBC in the AM  Normocytic Anemia -Patient hemoglobin/hematocrit has dropped from 14.9/43.9 on admission and is now 11.3/35.3 -Possibly dilutional drop  -Check anemia panel in the a.m. -We will continue to monitor for signs and symptoms of bleeding; currently no overt bleeding noted -Repeat CBC in a.m.  AKI Metabolic Acidosis -Patient's BUNs/creatinine went from 26/1.71 -> 9/0.70 -> 44/2.00 -> 31/1.95 -> 19/1.60 -> 13/1.42 -> 10/1.56 -The patient's acidosis has improved and he now has a CO2 of 25, anion gap of 6, chloride level of 109 -We will continue IV fluid hydration with D5 normal saline at 75 MLS per hour -Avoid further nephrotoxic medications, contrast dyes, hypotension and renally adjust medications  -Repeat CMP in a.m.  DVT prophylaxis: SCDs Code Status: Partial Code  Family Communication: No family present at bedside Disposition Plan: Pending Further Clearance by  GI and General Surgery   Status is: Inpatient  Remains inpatient appropriate because:Unsafe d/c plan, IV treatments appropriate due to intensity of illness or inability to take PO and Inpatient level of care appropriate due to severity of illness   Dispo: The patient is from: Home              Anticipated d/c is to: Home              Patient currently is not medically stable to d/c.   Difficult to place patient No  Consultants:   Medical Oncology  General Surgery  Gastroenterology   Procedures:  EGD  Findings:      LA Grade C (one or  more mucosal breaks continuous between tops of 2 or       more mucosal folds, less than 75% circumference) esophagitis with no       bleeding was found in the mid and distal esophagus. Biopsies were taken       with a cold forceps for histology.      A small hiatal hernia was present.      Scattered severe inflammation characterized by congestion (edema),       erosions, erythema and friability was found in the gastric body and in       the gastric antrum. Biopsies were taken with a cold forceps for       histology.      The cardia and gastric fundus were normal on retroflexion.      An acquired mild stenosis was found in the third and fourth portion of       the duodenum with inflmation and was traversed. Biopsies were taken with       a cold forceps for histology. Impression:               - LA Grade C esophagitis with no bleeding. Biopsied.                           - Gastritis. Biopsied.                           - Acquired duodenal stenosis. Biopsied.  FLEX SIGMOIDOSCOPY Findings:      The perianal and digital rectal examinations were normal.      A severe stenosis was found in the sigmoid colon and was non-traversed.       Biopsies were taken with a cold forceps for histology.There was       significant tortuosity and narrowing in the sigmoid colon at around 30       to 35 cm. Not able to advance pediatric colonoscopy or ultraslim scope       despite abdominal pressure and changing patient's position to supine       position. There was some inflammation and erythema at this area as well       as some inflammation in the rectosigmoid colon. Biopsies were taken.      Multiple small and large-mouthed diverticula were found in the sigmoid       colon.      Internal hemorrhoids were found during retroflexion. The hemorrhoids       were medium-sized. Impression:               - Stricture in the sigmoid colon. Biopsied.                           -  Diverticulosis in the sigmoid colon.                            - Internal hemorrhoids. Moderate Sedation:      Moderate (conscious) sedation was personally administered by an       anesthesia professional. The following parameters were monitored: oxygen       saturation, heart rate, blood pressure, and response to care. Recommendation:           - Return patient to hospital ward for ongoing care.                           - Full liquid diet.                           - Await pathology results.                           - Perform a barium enema tomorrow.   Antimicrobials:  Anti-infectives (From admission, onward)   Start     Dose/Rate Route Frequency Ordered Stop   05/18/21 1300  piperacillin-tazobactam (ZOSYN) IVPB 3.375 g        3.375 g 12.5 mL/hr over 240 Minutes Intravenous Every 8 hours 05/18/21 1216     05/18/21 0330  piperacillin-tazobactam (ZOSYN) IVPB 3.375 g        3.375 g 100 mL/hr over 30 Minutes Intravenous  Once 05/18/21 0326 05/18/21 0452        Subjective: Seen and examined at bedside and he is feeling okay and denies any pain.  Frustrated about what is going on with his scans.  No lightheadedness or dizziness.  No other concerns or complaints this time.  Objective: Vitals:   05/22/21 0435 05/22/21 1420 05/22/21 2045 05/23/21 0519  BP: (!) 147/94 (!) 166/89 (!) 155/92 (!) 145/79  Pulse: (!) 59 61 (!) 57 (!) 48  Resp: 16 20 18 19   Temp: 98 F (36.7 C) 97.7 F (36.5 C) 98.4 F (36.9 C)   TempSrc: Oral Oral Oral   SpO2: 95% 97% 98% 96%  Weight:      Height:       No intake or output data in the 24 hours ending 05/23/21 1110 Filed Weights   05/18/21 0125  Weight: 74.4 kg   Examination: Physical Exam:  Constitutional: The patient is a thin elderly Caucasian male currently in no acute distress but he does appear little frustrated Eyes: Lids and conjunctivae normal, sclerae anicteric  ENMT: External Ears, Nose appear normal. Grossly normal hearing.  Neck: Appears normal, supple, no cervical  masses, normal ROM, no appreciable thyromegaly: No JVD Respiratory: Diminished to auscultation bilaterally, no wheezing, rales, rhonchi or crackles. Normal respiratory effort and patient is not tachypenic. No accessory muscle use.  Unlabored breathing Cardiovascular: RRR, no murmurs / rubs / gallops. S1 and S2 auscultated.  Abdomen: Soft, non-tender, non-distended. Bowel sounds positive.  GU: Deferred. Musculoskeletal: No clubbing / cyanosis of digits/nails. No joint deformity upper and lower extremities.  Skin: No rashes, lesions, ulcers on limited skin evaluation. No induration; Warm and dry.  Neurologic: CN 2-12 grossly intact with no focal deficits.  Romberg sign cerebellar reflexes not assessed.  Psychiatric: Normal judgment and insight. Alert and oriented x 3.  Slightly frustrated mood and appropriate affect.   Data Reviewed: I have personally reviewed  following labs and imaging studies  CBC: Recent Labs  Lab 05/18/21 0315 05/19/21 0513 05/20/21 0534 05/21/21 0609 05/22/21 0444 05/23/21 0546  WBC 6.1 6.3 7.1 5.9 6.9 7.2  NEUTROABS 4.1  --  3.9 2.8 3.1 3.4  HGB 14.9 13.3 13.4 11.3* 11.6* 11.3*  HCT 43.9 40.5 41.1 35.2* 35.6* 35.3*  MCV 95.2 98.1 98.1 98.9 98.3 99.7  PLT 514* 596* 788* 655* 753* 341*   Basic Metabolic Panel: Recent Labs  Lab 05/19/21 0513 05/20/21 0534 05/21/21 0609 05/22/21 0444 05/23/21 0546  NA 135 135 138 140 140  K 4.5 4.7 4.0 3.7 3.9  CL 107 105 109 109 109  CO2 19* 23 23 25 27   GLUCOSE 100* 125* 125* 119* 121*  BUN 44* 31* 19 13 10   CREATININE 2.00* 1.95* 1.60* 1.42* 1.56*  CALCIUM 8.2* 8.6* 7.9* 8.0* 8.1*  MG  --  2.0 1.9 1.8 1.7  PHOS  --  1.9* 2.8 2.5 2.6   GFR: Estimated Creatinine Clearance: 40.4 mL/min (A) (by C-G formula based on SCr of 1.56 mg/dL (H)). Liver Function Tests: Recent Labs  Lab 05/19/21 0513 05/20/21 0534 05/21/21 0609 05/22/21 0444 05/23/21 0546  AST 26 20 16 22 27   ALT 28 23 18 18 23   ALKPHOS 54 54 43 44 39   BILITOT 0.6 0.3 0.3 0.2* 0.5  PROT 6.4* 6.3* 5.4* 5.3* 5.4*  ALBUMIN 3.2* 3.2* 2.7* 2.7* 2.7*   Recent Labs  Lab 05/18/21 0315  LIPASE 23   No results for input(s): AMMONIA in the last 168 hours. Coagulation Profile: No results for input(s): INR, PROTIME in the last 168 hours. Cardiac Enzymes: No results for input(s): CKTOTAL, CKMB, CKMBINDEX, TROPONINI in the last 168 hours. BNP (last 3 results) No results for input(s): PROBNP in the last 8760 hours. HbA1C: No results for input(s): HGBA1C in the last 72 hours. CBG: No results for input(s): GLUCAP in the last 168 hours. Lipid Profile: No results for input(s): CHOL, HDL, LDLCALC, TRIG, CHOLHDL, LDLDIRECT in the last 72 hours. Thyroid Function Tests: No results for input(s): TSH, T4TOTAL, FREET4, T3FREE, THYROIDAB in the last 72 hours. Anemia Panel: No results for input(s): VITAMINB12, FOLATE, FERRITIN, TIBC, IRON, RETICCTPCT in the last 72 hours. Sepsis Labs: Recent Labs  Lab 05/18/21 0349  LATICACIDVEN 1.2    Recent Results (from the past 240 hour(s))  Resp Panel by RT-PCR (Flu A&B, Covid) Nasopharyngeal Swab     Status: None   Collection Time: 05/18/21  3:49 AM   Specimen: Nasopharyngeal Swab; Nasopharyngeal(NP) swabs in vial transport medium  Result Value Ref Range Status   SARS Coronavirus 2 by RT PCR NEGATIVE NEGATIVE Final    Comment: (NOTE) SARS-CoV-2 target nucleic acids are NOT DETECTED.  The SARS-CoV-2 RNA is generally detectable in upper respiratory specimens during the acute phase of infection. The lowest concentration of SARS-CoV-2 viral copies this assay can detect is 138 copies/mL. A negative result does not preclude SARS-Cov-2 infection and should not be used as the sole basis for treatment or other patient management decisions. A negative result may occur with  improper specimen collection/handling, submission of specimen other than nasopharyngeal swab, presence of viral mutation(s) within the areas  targeted by this assay, and inadequate number of viral copies(<138 copies/mL). A negative result must be combined with clinical observations, patient history, and epidemiological information. The expected result is Negative.  Fact Sheet for Patients:  EntrepreneurPulse.com.au  Fact Sheet for Healthcare Providers:  IncredibleEmployment.be  This test is no t yet  approved or cleared by the [place] and  has been authorized for detection and/or diagnosis of SARS-CoV-2 by FDA under an Emergency Use Authorization (EUA). This EUA will remain  in effect (meaning this test can be used) for the duration of the COVID-19 declaration under Section 564(b)(1) of the Act, 21 U.S.C.section 360bbb-3(b)(1), unless the authorization is terminated  or revoked sooner.       Influenza A by PCR NEGATIVE NEGATIVE Final   Influenza B by PCR NEGATIVE NEGATIVE Final    Comment: (NOTE) The Xpert Xpress SARS-CoV-2/FLU/RSV plus assay is intended as an aid in the diagnosis of influenza from Nasopharyngeal swab specimens and should not be used as a sole basis for treatment. Nasal washings and aspirates are unacceptable for Xpert Xpress SARS-CoV-2/FLU/RSV testing.  Fact Sheet for Patients: EntrepreneurPulse.com.au  Fact Sheet for Healthcare Providers: IncredibleEmployment.be  This test is not yet approved or cleared by the [place] FDA and has been authorized for detection and/or diagnosis of SARS-CoV-2 by FDA under an Emergency Use Authorization (EUA). This EUA will remain in effect (meaning this test can be used) for the duration of the COVID-19 declaration under Section 564(b)(1) of the Act, 21 U.S.C. section 360bbb-3(b)(1), unless the authorization is terminated or revoked.  Performed at [facility], [address] [place], [place] [address]     RN Pressure Injury Documentation:     Estimated body  mass index is 22.88 kg/m as calculated from the following:   Height as of this encounter: 5\' 11"  (1.803 m).   Weight as of this encounter: 74.4 kg.  Malnutrition Type: Nutrition Problem: Increased nutrient needs Etiology: cancer and cancer related treatments Malnutrition Characteristics: Signs/Symptoms: estimated needs Nutrition Interventions: Interventions: Ensure Enlive (each supplement provides 350kcal and 20 grams of protein),Magic cup,MVI Radiology Studies: No results found. Scheduled Meds: . Chlorhexidine Gluconate Cloth  6 each Topical Daily  . feeding supplement  237 mL Oral TID BM  . multivitamin with minerals  1 tablet Oral Daily  . potassium & sodium phosphates  1 packet Oral TID WC & HS  . sodium phosphate  1 enema Rectal Once   Continuous Infusions: . dextrose 5 % and 0.9% NaCl 75 mL/hr at 05/23/21 0550  . magnesium sulfate bolus IVPB 1 g (05/23/21 1018)  . piperacillin-tazobactam (ZOSYN)  IV 3.375 g (05/23/21 0550)    LOS: 5 days   Kerney Elbe, DO [facility] PAGER is on [place]  If 7PM-7AM, please contact night-coverage [email or web address]

## 2021-05-24 ENCOUNTER — Inpatient Hospital Stay (HOSPITAL_COMMUNITY): Payer: Medicare Other

## 2021-05-24 LAB — COMPREHENSIVE METABOLIC PANEL
ALT: 23 U/L (ref 0–44)
AST: 27 U/L (ref 15–41)
Albumin: 2.7 g/dL — ABNORMAL LOW (ref 3.5–5.0)
Alkaline Phosphatase: 40 U/L (ref 38–126)
Anion gap: 5 (ref 5–15)
BUN: 10 mg/dL (ref 8–23)
CO2: 26 mmol/L (ref 22–32)
Calcium: 8.2 mg/dL — ABNORMAL LOW (ref 8.9–10.3)
Chloride: 109 mmol/L (ref 98–111)
Creatinine, Ser: 1.63 mg/dL — ABNORMAL HIGH (ref 0.61–1.24)
GFR, Estimated: 43 mL/min — ABNORMAL LOW (ref >60)
Glucose, Bld: 118 mg/dL — ABNORMAL HIGH (ref 70–99)
Potassium: 4 mmol/L (ref 3.5–5.1)
Sodium: 140 mmol/L (ref 135–145)
Total Bilirubin: 0.4 mg/dL (ref 0.3–1.2)
Total Protein: 5.5 g/dL — ABNORMAL LOW (ref 6.5–8.1)

## 2021-05-24 LAB — CBC WITH DIFFERENTIAL/PLATELET
Abs Immature Granulocytes: 0.06 10*3/uL (ref 0.00–0.07)
Basophils Absolute: 0.1 10*3/uL (ref 0.0–0.1)
Basophils Relative: 1 %
Eosinophils Absolute: 0.3 10*3/uL (ref 0.0–0.5)
Eosinophils Relative: 4 %
HCT: 36.2 % — ABNORMAL LOW (ref 39.0–52.0)
Hemoglobin: 11.6 g/dL — ABNORMAL LOW (ref 13.0–17.0)
Immature Granulocytes: 1 %
Lymphocytes Relative: 27 %
Lymphs Abs: 2.5 10*3/uL (ref 0.7–4.0)
MCH: 31.6 pg (ref 26.0–34.0)
MCHC: 32 g/dL (ref 30.0–36.0)
MCV: 98.6 fL (ref 80.0–100.0)
Monocytes Absolute: 1.5 10*3/uL — ABNORMAL HIGH (ref 0.1–1.0)
Monocytes Relative: 17 %
Neutro Abs: 4.8 10*3/uL (ref 1.7–7.7)
Neutrophils Relative %: 50 %
Platelets: 792 10*3/uL — ABNORMAL HIGH (ref 150–400)
RBC: 3.67 MIL/uL — ABNORMAL LOW (ref 4.22–5.81)
RDW: 14.5 % (ref 11.5–15.5)
WBC: 9.3 10*3/uL (ref 4.0–10.5)
nRBC: 0 % (ref 0.0–0.2)

## 2021-05-24 LAB — PHOSPHORUS: Phosphorus: 2.7 mg/dL (ref 2.5–4.6)

## 2021-05-24 LAB — MAGNESIUM: Magnesium: 1.8 mg/dL (ref 1.7–2.4)

## 2021-05-24 LAB — SURGICAL PATHOLOGY

## 2021-05-24 MED ORDER — MAGNESIUM SULFATE IN D5W 1-5 GM/100ML-% IV SOLN
1.0000 g | Freq: Once | INTRAVENOUS | Status: AC
  Administered 2021-05-24: 1 g via INTRAVENOUS
  Filled 2021-05-24: qty 100

## 2021-05-24 MED ORDER — DIATRIZOATE MEGLUMINE & SODIUM 66-10 % PO SOLN
ORAL | Status: AC
  Administered 2021-05-24: 300 mL via RECTAL
  Filled 2021-05-24: qty 300

## 2021-05-24 MED ORDER — POLYETHYLENE GLYCOL 3350 17 G PO PACK
17.0000 g | PACK | Freq: Every day | ORAL | Status: DC
  Administered 2021-05-25: 17 g via ORAL
  Filled 2021-05-24 (×2): qty 1

## 2021-05-24 MED ORDER — DIATRIZOATE MEGLUMINE & SODIUM 66-10 % PO SOLN
300.0000 mL | Freq: Once | ORAL | Status: DC
  Filled 2021-05-24 (×3): qty 300

## 2021-05-24 NOTE — Progress Notes (Signed)
[facility] Progress Note  4 Days Post-Op  Subjective: Patient very appropriately upset and frustrated today.  Feeling well and frustrated that he has sat here all weekend to wait for a radiology test.  He is wanting to have this today and stent placement.  Hasn't been seen by GI yet, frustrated with that as well.  He is unaware of whether he should eat or not and just overall confused with what is planned.  He is unsure why he is still on abx therapy, etc.  He is passing flatus and having some small BMs, but not much recently since he isn't eating much.    Objective: Vital signs in last 24 hours: Temp:  [97.7 F (36.5 C)-98.1 F (36.7 C)] 98.1 F (36.7 C) (06/06 0502) Pulse Rate:  [57-68] 57 (06/06 0502) Resp:  [15-17] 15 (06/06 0502) BP: (147-188)/(80-100) 147/80 (06/06 0502) SpO2:  [97 %] 97 % (06/06 0502) Last BM Date: 05/22/21  Intake/Output from previous day: 06/05 0701 - 06/06 0700 In: 2016.1 [P.O.:240; I.V.:1500; IV Piggyback:276.1] Out: -  Intake/Output this shift: No intake/output data recorded.  PE: Gen: Alert, NAD Pulm: rate and effort normal ZOX:WRUE, nondistended, nontender, +BS Skin: no rashes noted, warm and dry  Lab Results:  Recent Labs    05/23/21 0546 05/24/21 0502  WBC 7.2 9.3  HGB 11.3* 11.6*  HCT 35.3* 36.2*  PLT 764* 792*   BMET Recent Labs    05/23/21 0546 05/24/21 0502  NA 140 140  K 3.9 4.0  CL 109 109  CO2 27 26  GLUCOSE 121* 118*  BUN 10 10  CREATININE 1.56* 1.63*  CALCIUM 8.1* 8.2*   PT/INR No results for input(s): LABPROT, INR in the last 72 hours. CMP     Component Value Date/Time   NA 140 05/24/2021 0502   NA 141 03/22/2017 1101   K 4.0 05/24/2021 0502   CL 109 05/24/2021 0502   CO2 26 05/24/2021 0502   GLUCOSE 118 (H) 05/24/2021 0502   BUN 10 05/24/2021 0502   BUN 17 03/22/2017 1101   CREATININE 1.63 (H) 05/24/2021 0502   CREATININE 1.71 (H) 04/28/2021 1032   CALCIUM 8.2 (L) 05/24/2021 0502   PROT  5.5 (L) 05/24/2021 0502   PROT 6.9 03/22/2017 1101   ALBUMIN 2.7 (L) 05/24/2021 0502   ALBUMIN 4.5 03/22/2017 1101   AST 27 05/24/2021 0502   AST 27 04/28/2021 1032   ALT 23 05/24/2021 0502   ALT 18 04/28/2021 1032   ALKPHOS 40 05/24/2021 0502   BILITOT 0.4 05/24/2021 0502   BILITOT 0.5 04/28/2021 1032   GFRNONAA 43 (L) 05/24/2021 0502   GFRNONAA 40 (L) 04/28/2021 1032   GFRAA >60 11/24/2019 1600   Lipase     Component Value Date/Time   LIPASE 23 05/18/2021 0315       Studies/Results: No results found.  Anti-infectives: Anti-infectives (From admission, onward)   Start     Dose/Rate Route Frequency Ordered Stop   05/18/21 1300  piperacillin-tazobactam (ZOSYN) IVPB 3.375 g        3.375 g 12.5 mL/hr over 240 Minutes Intravenous Every 8 hours 05/18/21 1216     05/18/21 0330  piperacillin-tazobactam (ZOSYN) IVPB 3.375 g        3.375 g 100 mL/hr over 30 Minutes Intravenous  Once 05/18/21 0326 05/18/21 0452       Assessment/Plan Stricture/stenosis of sigmoid colon, unclear etiology -CT scan shows distension of the distal small bowel and colon with change in caliber  at a thick-walled segment of proximal sigmoid colon, distal colonic obstruction cannot be excluded - s/p flex sig 6/2: Stricture in the sigmoid colon, biopsy pending - gastrografin enema today per GI, with hopes of stenting to follow, timing per GI -patient has completed 7 days of zosyn for unclear reasons.  He has never had a WBC or a fever.  This should be stopped.  He did have some pneumatosis on his original scan and I suspect this is why he was on zosyn, but he has a stricture which is not infectious. -add daily miralax given stenosis/stricture  Hx metastatic pancreatic cancer - s/pdistal pancreatectomy, splenectomy, and partial gastrectomy in 10/2017 - CT scan shows progressive metastatic disease with new/enlarging pulmonary nodulesand enlarging mesenteric soft tissue mass at the duodenaljejunal  junction - recently started chemo for recurrent cancer, Dr. Marin Olp following  ID -zosyn 5/31>> no surgical need for this, can be stopped FEN -IVF, FLD, Ensure VTE -SCDs, per primary/ ok for chemical DVT prophylaxis from surgical standpoint Foley -none Follow up -TBD   LOS: 6 days    Henreitta Cea, [facility] 05/24/2021, 9:01 AM Please see Amion for pager number during day hours 7:00am-4:30pm

## 2021-05-24 NOTE — Progress Notes (Signed)
PROGRESS NOTE    Kevin Villegas  IZT:[id number] DOB: 01/03/1941 DOA: 05/18/2021 PCP: Dettinger, Kevin Kaufmann, MD   Brief Narrative:  The patient is a 80 year old Caucasian male with a past medical history significant for but not limited to pancreatic cancer and other comorbidities who presented with abdominal pain started 2 nights prior to admission.  Patient states that he had severe sharp lower abdominal pain and described as an 8 out of 10 in severity and stated that it came in 32nd episode.  He reports that he had a weeks worth of constipation prior to this.  He went to the ED and was given laxatives and went home with some improvement in his symptoms.  He states that late yesterday he developed a new abdominal pain and this was epigastric rating to her back.  He described it like a sharp knife type sensation and states it was worsened with food.  He felt as if he had pancreatitis again so he came to the ED for help.  In the ED he had normal lab work but CT of the abdomen pelvis showed progressive metastatic disease with new and enlarging pulmonary nodules and enlarging mesenteric soft tissue mass at the duodenal jejunal junction as well as distention of the distal small bowel and colon which changes in caliber at a thick-walled segment of the proximal sigmoid colon as well as distal colonic obstruction that could not be excluded.  There was also pneumatosis of the cecum and proximal ascending colon that had a differential of bowel ischemia or infection as well as right-sided obstructive uropathy due to an 8 mm obstructing distal right ureteral calculus.  General surgery was consulted as well as gastroenterology as well as medical oncology.  The  patient underwent an EGD and flex sigmoidoscopy.  The plan was to have the patient have another CT scan of the abdomen pelvis with oral contrast however after further discussion the gastroenterologist spoke with the radiologist Dr. Ilda Foil and it was felt that the CT  with oral contrast would not likely be very helpful and the best way to assess the stricture would be likely the Gastrografin enema and this will be done on Monday, 05/24/2021.  I spoke to the radiologist Dr. Nyoka Cowden who states that the patient will be going for Gastrografin enema around 1:00 pm and recommended to be n.p.o.  Further management based on Gastrografin findings and likely will get sigmoid colon stent possibly   Assessment & Plan:   Active Problems:   Colon obstruction (HCC)  Metastatic Pancreatic Cancer -CT of the abdomen pelvis done and showed progressive metastatic disease with new and enlarging pulmonary nodules as well as enlarging mesenteric soft tissue mass at the duodenal jejunal junction as well as the distal tension of the distal small bowel and colon -Medical oncology was consulted and patient had recently been started on systemic chemotherapy with gemcitabine and Abraxane and tolerated his first cycle overall with exception of constipation -Medical oncology feels that that his disease progression occurred prior to the chemotherapy starting and recommend for him to resume chemotherapy once he is recovered from his hospitalization as an outpatient -Patient had surgical intervention with [facility] in 2018 -Patient primarily seen oncology care at [facility] but has also seen Dr. Marin Villegas in the past -Continue pain control with IV morphine -Dr. Marin Villegas is following here and does not feel as if his obstruction is cancer related   Bowel/Colonic Obstruction in the setting of Sigmoid Colon Stricture Abdominal Pain Constipation -Patient was admitted to  telemetry inpatient -General Surgery was consulted and appreciate further evaluation and assistance; they recommended bowel rest and IV antibiotics and recommending GI consult to evaluate the colon further -Continue IV Zosyn -Gastroenterology evaluating and reviewed his scans which showed pneumatosis of the cecum and proximal ascending  colon -GI took the patient for EGD and flexible sigmoidoscopy today; patient's EGD showed esophagitis, gastritis and stenosis and inflammation in the third and fourth portion of the duodenum and biopsies were taken.  His flexible sigmoidoscopy was extremely difficult and Dr. Alessandra Bevels was not able to advance the scope beyond the sigmoid colon stricture and there is some associated inflammation.  The scope was changed to ultraslin without any success so she recommends full liquid diet today continue fleets enemas and consider barium enema tomorrow and follow with biopsies -They feel the patient would be high risk for full colonoscopy given his pneumatosis -GI recommended full liquid diet yesterday and advanced to Soft yesterday but now this has been changed back to full given GI recommendations today -Currently awaiting biopsy results from the flex sig -GI was going to plan for a barium enema yesterday however Dr. Alessandra Bevels had multiple discussions with the radiologist regarding a barium enema as well as Gastrografin enema.  According to radiologist Dr. Jacalyn Lefevre the patient will be at high risk for complication from the barium enema because of his recent pneumatosis seen on CT scan and the recommendation from the radiologist was to repeat the CT scan without contrast over the weekend and possibly do a Gastrografin enema on Monday if resolution of pneumatosis is seen on the repeat CT scan; after further discussion Dr.Outlaw spoke with Dr. Ilda Foil and it was felt that the CT scan with oral contrast will not likely be helpful and the best way to assess the stricture would likely be the Gastrografin enema and this will likely happen on Monday, 05/24/2021 and the study will help assess the feasibility/need for colonic stenting; patient to have Gastrografin enema today -Appreciate GI evaluation and they they were going to advance the diet to soft diet however GI now recommends remaining on a full code diet  Obstructing  Right Ureteral Stone -He is currently asymptomatic from his right ureteral stone and will need to follow-up with urology in outpatient setting given that Dr. Marylyn Ishihara spoke with urology and they felt that the patient can follow-up in outpatient if his UA was normal and his pain was controlled; UA is relatively unremarkable but he does have an AKI as below -Check for hydronephrosis via renal ultrasound -May need urology input as an inpatient if creatinine continues to worsen however is improving now  Hypophosphatemia -Patient's phosphorus level has gone from 1.9 - > 2.8 -> 2.5 -> 2.6 -> 2.7 -Replete with po K Phos NAK -Continue to Monitor and Trend  -Repeat Phos Level in the AM   Thrombocytosis  -Patient's Platelet Count has gone from 514 -> 596 -> 788 -> 655 -> 753 -> 764 -> 792 -Continue to Monitor and Trend -Repeat CBC in the AM  Normocytic Anemia -Patient hemoglobin/hematocrit has dropped from 14.9/43.9 on admission and is now 11.6/36.2 -Possibly dilutional drop  -Check anemia panel in the a.m. -We will continue to monitor for signs and symptoms of bleeding; currently no overt bleeding noted -Repeat CBC in a.m.  AKI Metabolic Acidosis -Patient's BUNs/creatinine went from 26/1.71 -> 9/0.70 -> 44/2.00 -> 31/1.95 -> 19/1.60 -> 13/1.42 -> 10/1.56 -> 10/1.63 -The patient's acidosis has improved and he now has a CO2 of 25, anion  gap of 6, chloride level of 109 -We will continue IV fluid hydration with D5 normal saline at 75 MLS per hour -Will check renal ultrasound to evaluate for hydronephrosis again -Avoid further nephrotoxic medications, contrast dyes, hypotension and renally adjust medications  -Repeat CMP in a.m.  DVT prophylaxis: SCDs Code Status: Partial Code  Family Communication: No family present at bedside Disposition Plan: Pending Further Clearance by GI and General Surgery   Status is: Inpatient  Remains inpatient appropriate because:Unsafe d/c plan, IV treatments  appropriate due to intensity of illness or inability to take PO and Inpatient level of care appropriate due to severity of illness   Dispo: The patient is from: Home              Anticipated d/c is to: Home              Patient currently is not medically stable to d/c.   Difficult to place patient No  Consultants:   Medical Oncology  General Surgery  Gastroenterology   Procedures:  EGD  Findings:      LA Grade C (one or more mucosal breaks continuous between tops of 2 or       more mucosal folds, less than 75% circumference) esophagitis with no       bleeding was found in the mid and distal esophagus. Biopsies were taken       with a cold forceps for histology.      A small hiatal hernia was present.      Scattered severe inflammation characterized by congestion (edema),       erosions, erythema and friability was found in the gastric body and in       the gastric antrum. Biopsies were taken with a cold forceps for       histology.      The cardia and gastric fundus were normal on retroflexion.      An acquired mild stenosis was found in the third and fourth portion of       the duodenum with inflmation and was traversed. Biopsies were taken with       a cold forceps for histology. Impression:               - LA Grade C esophagitis with no bleeding. Biopsied.                           - Gastritis. Biopsied.                           - Acquired duodenal stenosis. Biopsied.  FLEX SIGMOIDOSCOPY Findings:      The perianal and digital rectal examinations were normal.      A severe stenosis was found in the sigmoid colon and was non-traversed.       Biopsies were taken with a cold forceps for histology.There was       significant tortuosity and narrowing in the sigmoid colon at around 30       to 35 cm. Not able to advance pediatric colonoscopy or ultraslim scope       despite abdominal pressure and changing patient's position to supine       position. There was some  inflammation and erythema at this area as well       as some inflammation in the rectosigmoid colon. Biopsies were taken.      Multiple small and large-mouthed  diverticula were found in the sigmoid       colon.      Internal hemorrhoids were found during retroflexion. The hemorrhoids       were medium-sized. Impression:               - Stricture in the sigmoid colon. Biopsied.                           - Diverticulosis in the sigmoid colon.                           - Internal hemorrhoids. Moderate Sedation:      Moderate (conscious) sedation was personally administered by an       anesthesia professional. The following parameters were monitored: oxygen       saturation, heart rate, blood pressure, and response to care. Recommendation:           - Return patient to hospital ward for ongoing care.                           - Full liquid diet.                           - Await pathology results.                           - Perform a barium enema tomorrow.   Antimicrobials:  Anti-infectives (From admission, onward)   Start     Dose/Rate Route Frequency Ordered Stop   05/18/21 1300  piperacillin-tazobactam (ZOSYN) IVPB 3.375 g  Status:  Discontinued        3.375 g 12.5 mL/hr over 240 Minutes Intravenous Every 8 hours 05/18/21 1216 05/24/21 0911   05/18/21 0330  piperacillin-tazobactam (ZOSYN) IVPB 3.375 g        3.375 g 100 mL/hr over 30 Minutes Intravenous  Once 05/18/21 0326 05/18/21 0452        Subjective: Seen and examined at bedside and remains Frustrated.  Patient is post to get his Gastrografin enema later today.  No nausea or vomiting.  Feels actually quite well.  No other concerns or complaints at this time.  Objective: Vitals:   05/23/21 2037 05/23/21 2134 05/23/21 2259 05/24/21 0502  BP: (!) 188/92 (!) 180/100 (!) 154/86 (!) 147/80  Pulse: 60 (!) 57 68 (!) 57  Resp: 17   15  Temp:    98.1 F (36.7 C)  TempSrc:    Oral  SpO2: 97%   97%  Weight:      Height:         Intake/Output Summary (Last 24 hours) at 05/24/2021 1205 Last data filed at 05/24/2021 0600 Gross per 24 hour  Intake 2016.08 ml  Output --  Net 2016.08 ml   Filed Weights   05/18/21 0125  Weight: 74.4 kg   Examination: Physical Exam:  Constitutional: The patient is a thin elderly Caucasian male currently in no acute distress and continues to be frustrated Eyes: Lids and conjunctivae normal, sclerae anicteric  ENMT: External Ears, Nose appear normal. Grossly normal hearing.  Neck: Appears normal, supple, no cervical masses, normal ROM, no appreciable thyromegaly; no JVD Respiratory: Diminished to auscultation bilaterally, no wheezing, rales, rhonchi or crackles. Normal respiratory effort and patient is not tachypenic. No accessory muscle  use.  Unlabored breathing Cardiovascular: RRR, no murmurs / rubs / gallops. S1 and S2 auscultated.  Abdomen: Soft, non-tender, non-distended. Bowel sounds positive.  GU: Deferred. Musculoskeletal: No clubbing / cyanosis of digits/nails. No joint deformity upper and lower extremities.  Skin: No rashes, lesions, ulcers on limited skin evaluation. No induration; Warm and dry.  Neurologic: CN 2-12 grossly intact with no focal deficits. Romberg sign and cerebellar reflexes not assessed.  Psychiatric: Normal judgment and insight. Alert and oriented x 3.  Frustrated mood and appropriate affect.   Data Reviewed: I have personally reviewed following labs and imaging studies  CBC: Recent Labs  Lab 05/20/21 0534 05/21/21 0609 05/22/21 0444 05/23/21 0546 05/24/21 0502  WBC 7.1 5.9 6.9 7.2 9.3  NEUTROABS 3.9 2.8 3.1 3.4 4.8  HGB 13.4 11.3* 11.6* 11.3* 11.6*  HCT 41.1 35.2* 35.6* 35.3* 36.2*  MCV 98.1 98.9 98.3 99.7 98.6  PLT 788* 655* 753* 764* 500*   Basic Metabolic Panel: Recent Labs  Lab 05/20/21 0534 05/21/21 0609 05/22/21 0444 05/23/21 0546 05/24/21 0502  NA 135 138 140 140 140  K 4.7 4.0 3.7 3.9 4.0  CL 105 109 109 109 109  CO2 23 23  25 27 26   GLUCOSE 125* 125* 119* 121* 118*  BUN 31* 19 13 10 10   CREATININE 1.95* 1.60* 1.42* 1.56* 1.63*  CALCIUM 8.6* 7.9* 8.0* 8.1* 8.2*  MG 2.0 1.9 1.8 1.7 1.8  PHOS 1.9* 2.8 2.5 2.6 2.7   GFR: Estimated Creatinine Clearance: 38.7 mL/min (A) (by C-G formula based on SCr of 1.63 mg/dL (H)). Liver Function Tests: Recent Labs  Lab 05/20/21 0534 05/21/21 0609 05/22/21 0444 05/23/21 0546 05/24/21 0502  AST 20 16 22 27 27   ALT 23 18 18 23 23   ALKPHOS 54 43 44 39 40  BILITOT 0.3 0.3 0.2* 0.5 0.4  PROT 6.3* 5.4* 5.3* 5.4* 5.5*  ALBUMIN 3.2* 2.7* 2.7* 2.7* 2.7*   Recent Labs  Lab 05/18/21 0315  LIPASE 23   No results for input(s): AMMONIA in the last 168 hours. Coagulation Profile: No results for input(s): INR, PROTIME in the last 168 hours. Cardiac Enzymes: No results for input(s): CKTOTAL, CKMB, CKMBINDEX, TROPONINI in the last 168 hours. BNP (last 3 results) No results for input(s): PROBNP in the last 8760 hours. HbA1C: No results for input(s): HGBA1C in the last 72 hours. CBG: No results for input(s): GLUCAP in the last 168 hours. Lipid Profile: No results for input(s): CHOL, HDL, LDLCALC, TRIG, CHOLHDL, LDLDIRECT in the last 72 hours. Thyroid Function Tests: No results for input(s): TSH, T4TOTAL, FREET4, T3FREE, THYROIDAB in the last 72 hours. Anemia Panel: No results for input(s): VITAMINB12, FOLATE, FERRITIN, TIBC, IRON, RETICCTPCT in the last 72 hours. Sepsis Labs: Recent Labs  Lab 05/18/21 0349  LATICACIDVEN 1.2    Recent Results (from the past 240 hour(s))  Resp Panel by RT-PCR (Flu A&B, Covid) Nasopharyngeal Swab     Status: None   Collection Time: 05/18/21  3:49 AM   Specimen: Nasopharyngeal Swab; Nasopharyngeal(NP) swabs in vial transport medium  Result Value Ref Range Status   SARS Coronavirus 2 by RT PCR NEGATIVE NEGATIVE Final    Comment: (NOTE) SARS-CoV-2 target nucleic acids are NOT DETECTED.  The SARS-CoV-2 RNA is generally detectable in  upper respiratory specimens during the acute phase of infection. The lowest concentration of SARS-CoV-2 viral copies this assay can detect is 138 copies/mL. A negative result does not preclude SARS-Cov-2 infection and should not be used as the sole  basis for treatment or other patient management decisions. A negative result may occur with  improper specimen collection/handling, submission of specimen other than nasopharyngeal swab, presence of viral mutation(s) within the areas targeted by this assay, and inadequate number of viral copies(<138 copies/mL). A negative result must be combined with clinical observations, patient history, and epidemiological information. The expected result is Negative.  Fact Sheet for Patients:  EntrepreneurPulse.com.au  Fact Sheet for Healthcare Providers:  IncredibleEmployment.be  This test is no t yet approved or cleared by the [place] FDA and  has been authorized for detection and/or diagnosis of SARS-CoV-2 by FDA under an Emergency Use Authorization (EUA). This EUA will remain  in effect (meaning this test can be used) for the duration of the COVID-19 declaration under Section 564(b)(1) of the Act, 21 U.S.C.section 360bbb-3(b)(1), unless the authorization is terminated  or revoked sooner.       Influenza A by PCR NEGATIVE NEGATIVE Final   Influenza B by PCR NEGATIVE NEGATIVE Final    Comment: (NOTE) The Xpert Xpress SARS-CoV-2/FLU/RSV plus assay is intended as an aid in the diagnosis of influenza from Nasopharyngeal swab specimens and should not be used as a sole basis for treatment. Nasal washings and aspirates are unacceptable for Xpert Xpress SARS-CoV-2/FLU/RSV testing.  Fact Sheet for Patients: EntrepreneurPulse.com.au  Fact Sheet for Healthcare Providers: IncredibleEmployment.be  This test is not yet approved or cleared by the [place] FDA and has been  authorized for detection and/or diagnosis of SARS-CoV-2 by FDA under an Emergency Use Authorization (EUA). This EUA will remain in effect (meaning this test can be used) for the duration of the COVID-19 declaration under Section 564(b)(1) of the Act, 21 U.S.C. section 360bbb-3(b)(1), unless the authorization is terminated or revoked.  Performed at [facility], [address] [place], [place] [address]     RN Pressure Injury Documentation:     Estimated body mass index is 22.88 kg/m as calculated from the following:   Height as of this encounter: 5\' 11"  (1.803 m).   Weight as of this encounter: 74.4 kg.  Malnutrition Type: Nutrition Problem: Increased nutrient needs Etiology: cancer and cancer related treatments Malnutrition Characteristics: Signs/Symptoms: estimated needs Nutrition Interventions: Interventions: Ensure Enlive (each supplement provides 350kcal and 20 grams of protein),Magic cup,MVI Radiology Studies: No results found. Scheduled Meds: . Chlorhexidine Gluconate Cloth  6 each Topical Daily  . feeding supplement  237 mL Oral TID BM  . multivitamin with minerals  1 tablet Oral Daily  . polyethylene glycol  17 g Oral Daily  . potassium & sodium phosphates  1 packet Oral TID WC & HS  . sodium phosphate  1 enema Rectal Once   Continuous Infusions: . dextrose 5 % and 0.9% NaCl 75 mL/hr at 05/24/21 0843    LOS: 6 days   Kerney Elbe, DO [facility] PAGER is on [place]  If 7PM-7AM, please contact night-coverage [email or web address]

## 2021-05-24 NOTE — Progress Notes (Addendum)
[facility] Gastroenterology Progress Note  Cephas Revard 80 y.o. 02-01-1941  CC:  Colonic obstruction   Subjective: Patient has been feeling better.  Passing flatulence and having some small BMs.  Denies abdominal pain, nausea vomiting.  He is anxiously waiting to go home.   ROS : Review of Systems  Constitutional: Negative for chills and fever.  Respiratory: Negative for cough and shortness of breath.   Cardiovascular: Negative for chest pain and leg swelling.  Gastrointestinal: Negative for abdominal pain, nausea and vomiting.  Musculoskeletal: Negative for falls.      Objective: Vital signs in last 24 hours: Vitals:   05/23/21 2259 05/24/21 0502  BP: (!) 154/86 (!) 147/80  Pulse: 68 (!) 57  Resp:  15  Temp:  98.1 F (36.7 C)  SpO2:  97%    Physical Exam: Physical Exam Constitutional:      General: He is not in acute distress.    Appearance: He is well-developed.  Cardiovascular:     Rate and Rhythm: Normal rate and regular rhythm.     Heart sounds: Normal heart sounds.  Pulmonary:     Effort: Pulmonary effort is normal.     Breath sounds: Normal breath sounds.  Abdominal:     General: There is no distension.     Palpations: Abdomen is soft.     Tenderness: There is no abdominal tenderness.  Neurological:     General: No focal deficit present.     Mental Status: He is alert and oriented to person, place, and time.        Lab Results: Recent Labs    05/23/21 0546 05/24/21 0502  NA 140 140  K 3.9 4.0  CL 109 109  CO2 27 26  GLUCOSE 121* 118*  BUN 10 10  CREATININE 1.56* 1.63*  CALCIUM 8.1* 8.2*  MG 1.7 1.8  PHOS 2.6 2.7   Recent Labs    05/23/21 0546 05/24/21 0502  AST 27 27  ALT 23 23  ALKPHOS 39 40  BILITOT 0.5 0.4  PROT 5.4* 5.5*  ALBUMIN 2.7* 2.7*   Recent Labs    05/23/21 0546 05/24/21 0502  WBC 7.2 9.3  NEUTROABS 3.4 4.8  HGB 11.3* 11.6*  HCT 35.3* 36.2*  MCV 99.7 98.6  PLT 764* 792*   No results for input(s): LABPROT, INR  in the last 72 hours.  Lab Results: Results for orders placed or performed during the hospital encounter of 05/18/21 (from the past 48 hour(s))  CBC with Differential/Platelet     Status: Abnormal   Collection Time: 05/23/21  5:46 AM  Result Value Ref Range   WBC 7.2 4.0 - 10.5 K/uL   RBC 3.54 (L) 4.22 - 5.81 MIL/uL   Hemoglobin 11.3 (L) 13.0 - 17.0 g/dL   HCT 35.3 (L) 39.0 - 52.0 %   MCV 99.7 80.0 - 100.0 fL   MCH 31.9 26.0 - 34.0 pg   MCHC 32.0 30.0 - 36.0 g/dL   RDW 14.5 11.5 - 15.5 %   Platelets 764 (H) 150 - 400 K/uL   nRBC 0.0 0.0 - 0.2 %   Neutrophils Relative % 46 %   Neutro Abs 3.4 1.7 - 7.7 K/uL   Lymphocytes Relative 31 %   Lymphs Abs 2.2 0.7 - 4.0 K/uL   Monocytes Relative 17 %   Monocytes Absolute 1.2 (H) 0.1 - 1.0 K/uL   Eosinophils Relative 4 %   Eosinophils Absolute 0.3 0.0 - 0.5 K/uL   Basophils Relative 1 %  Basophils Absolute 0.1 0.0 - 0.1 K/uL   Immature Granulocytes 1 %   Abs Immature Granulocytes 0.04 0.00 - 0.07 K/uL    Comment: Performed at [facility], [place] [address] [place], [place] [address]  Comprehensive metabolic panel     Status: Abnormal   Collection Time: 05/23/21  5:46 AM  Result Value Ref Range   Sodium 140 135 - 145 mmol/L   Potassium 3.9 3.5 - 5.1 mmol/L   Chloride 109 98 - 111 mmol/L   CO2 27 22 - 32 mmol/L   Glucose, Bld 121 (H) 70 - 99 mg/dL    Comment: Glucose reference range applies only to samples taken after fasting for at least 8 hours.   BUN 10 8 - 23 mg/dL   Creatinine, Ser 1.56 (H) 0.61 - 1.24 mg/dL   Calcium 8.1 (L) 8.9 - 10.3 mg/dL   Total Protein 5.4 (L) 6.5 - 8.1 g/dL   Albumin 2.7 (L) 3.5 - 5.0 g/dL   AST 27 15 - 41 U/L   ALT 23 0 - 44 U/L   Alkaline Phosphatase 39 38 - 126 U/L   Total Bilirubin 0.5 0.3 - 1.2 mg/dL   GFR, Estimated 45 (L) >60 mL/min    Comment: (NOTE) Calculated using the CKD-EPI Creatinine Equation (2021)    Anion gap 4 (L) 5 - 15    Comment: Performed at [facility], [place] [address], [place], [place] [address]  Phosphorus     Status: None   Collection Time: 05/23/21  5:46 AM  Result Value Ref Range   Phosphorus 2.6 2.5 - 4.6 mg/dL    Comment: Performed at [facility], [place] [address] [place], [place] [address]  Magnesium     Status: None   Collection Time: 05/23/21  5:46 AM  Result Value Ref Range   Magnesium 1.7 1.7 - 2.4 mg/dL    Comment: Performed at [facility], [place] [address] [place], [place] [address]  CBC with Differential/Platelet     Status: Abnormal   Collection Time: 05/24/21  5:02 AM  Result Value Ref Range   WBC 9.3 4.0 - 10.5 K/uL   RBC 3.67 (L) 4.22 - 5.81 MIL/uL   Hemoglobin 11.6 (L) 13.0 - 17.0 g/dL   HCT 36.2 (L) 39.0 - 52.0 %   MCV 98.6 80.0 - 100.0 fL   MCH 31.6 26.0 - 34.0 pg   MCHC 32.0 30.0 - 36.0 g/dL   RDW 14.5 11.5 - 15.5 %   Platelets 792 (H) 150 - 400 K/uL   nRBC 0.0 0.0 - 0.2 %   Neutrophils Relative % 50 %   Neutro Abs 4.8 1.7 - 7.7 K/uL   Lymphocytes Relative 27 %   Lymphs Abs 2.5 0.7 - 4.0 K/uL   Monocytes Relative 17 %   Monocytes Absolute 1.5 (H) 0.1 - 1.0 K/uL   Eosinophils Relative 4 %   Eosinophils Absolute 0.3 0.0 - 0.5 K/uL   Basophils Relative 1 %   Basophils Absolute 0.1 0.0 - 0.1 K/uL   Immature Granulocytes 1 %   Abs Immature Granulocytes 0.06 0.00 - 0.07 K/uL    Comment: Performed at [facility], [place] [address], [place], [place] [address]  Comprehensive metabolic panel     Status: Abnormal   Collection Time: 05/24/21  5:02 AM  Result Value Ref Range   Sodium 140 135 - 145 mmol/L   Potassium 4.0 3.5 - 5.1 mmol/L  Chloride 109 98 - 111 mmol/L   CO2 26 22 - 32 mmol/L   Glucose, Bld 118 (H) 70 - 99 mg/dL    Comment: Glucose reference range applies only to samples taken after fasting for at least 8 hours.   BUN 10 8 - 23 mg/dL   Creatinine, Ser 1.63 (H) 0.61 - 1.24 mg/dL   Calcium 8.2 (L) 8.9 - 10.3 mg/dL    Total Protein 5.5 (L) 6.5 - 8.1 g/dL   Albumin 2.7 (L) 3.5 - 5.0 g/dL   AST 27 15 - 41 U/L   ALT 23 0 - 44 U/L   Alkaline Phosphatase 40 38 - 126 U/L   Total Bilirubin 0.4 0.3 - 1.2 mg/dL   GFR, Estimated 43 (L) >60 mL/min    Comment: (NOTE) Calculated using the CKD-EPI Creatinine Equation (2021)    Anion gap 5 5 - 15    Comment: Performed at [facility], [place] [address], [place], [place] [address]  Magnesium     Status: None   Collection Time: 05/24/21  5:02 AM  Result Value Ref Range   Magnesium 1.8 1.7 - 2.4 mg/dL    Comment: Performed at [facility], [address] [place], [place] [address]  Phosphorus     Status: None   Collection Time: 05/24/21  5:02 AM  Result Value Ref Range   Phosphorus 2.7 2.5 - 4.6 mg/dL    Comment: Performed at [facility], [place] [address], [place], [place] [address]    Studies/Results: No results found.  Assessment/Plan: Colonic obstruction:Flex sig 6/2 revealed a severe stenosis in the sigmoid colon, non-traversed. Biopsies pending. -CT also showed pneumatosis of cecumand proximal ascending colon,as well as increased size of small bowel metastasis.  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  However Dr. Paulita Fujita discussed with radiologist over the weekend that CT was not necessary and would not glean any new information.  The patient will have just barium enema with Gastrografin today.  Evaluate extent of stenosis.  Patient n.p.o. at this point per radiology recommendations.     Pending results, consider colonic stent placement  (Dr. Watt Climes).  NPO until post-enema, then full liquid diet OK.  [facility] will follow.   Vladimir Crofts PA-C 05/24/2021, 9:32 AM  Contact #  [phone]    Patient seen and examined and case discussed with my partner Dr. Paulita Fujita and our PA and his hospital computer chart reviewed and his CT endoscopic pictures and Gastrografin enema thoroughly reviewed this with Dr. Marin Olp as well and after our conversation he has elected to proceed with a stent on Wednesday morning.  Unfortunately there is no  Endoscopic available tomorrow and patient looks significantly Better than story sounds

## 2021-05-24 NOTE — Plan of Care (Signed)
  Problem: Pain Managment: Goal: General experience of comfort will improve Outcome: Progressing   Problem: Coping: Goal: Level of anxiety will decrease Outcome: Progressing   

## 2021-05-24 NOTE — Progress Notes (Signed)
Looks like we are still awaiting a Gastrografin enema to be done now.  This has been ordered.  This will help determine if a stent needs to be placed or can be placed in this stricture.  The patient feels pretty well.  He is eating okay.  He is having no abdominal discomfort.  He is going to the bathroom.  He has had no fever.  He has had no vomiting.  He is out of bed.  All of his labs look pretty decent.  His platelet count is on the high side at 792,000.  I do believe this is all reactive.  His hemoglobin is 11.6.  His BUN is 10 creatinine 1.63.  His albumin is 2.7.  On his exam, his temperature is 98.1.  Pulse 57.  Blood pressure 147/80.  His head exam shows no scleral icterus.  There is no adenopathy in the neck.  Lungs are clear bilaterally.  Cardiac exam regular rate and rhythm.  He has a 2/6 systolic ejection murmur.  Abdomen is soft.  Bowel sounds are somewhat decreased.  There is no distention.  There is no guarding or rebound tenderness.  There is no palpable abdominal mass.  Extremity shows no clubbing, cyanosis or edema.  Hopefully, a decision will be made today as to whether or not a stent can be placed.  If not, then I suspect he might need surgery to alleviate this stricture?  We will follow along.  His blood counts are okay.  His platelet count is on the high side but again I suspect this is reactive.  I appreciate the great care he is getting from all the staff of [facility] [address].  Lattie Haw, MD  Lurena Joiner 11:9

## 2021-05-24 NOTE — Care Management Important Message (Signed)
Important Message  Patient Details IM Letter given to the Patient. Name: Kevin Villegas MRN: [id number] Date of Birth: 06-07-1941   Medicare Important Message Given:  Yes     Kerin Salen 05/24/2021, 10:55 AM

## 2021-05-25 LAB — COMPREHENSIVE METABOLIC PANEL
ALT: 25 U/L (ref 0–44)
AST: 29 U/L (ref 15–41)
Albumin: 2.9 g/dL — ABNORMAL LOW (ref 3.5–5.0)
Alkaline Phosphatase: 50 U/L (ref 38–126)
Anion gap: 6 (ref 5–15)
BUN: 8 mg/dL (ref 8–23)
CO2: 25 mmol/L (ref 22–32)
Calcium: 8.4 mg/dL — ABNORMAL LOW (ref 8.9–10.3)
Chloride: 109 mmol/L (ref 98–111)
Creatinine, Ser: 1.51 mg/dL — ABNORMAL HIGH (ref 0.61–1.24)
GFR, Estimated: 47 mL/min — ABNORMAL LOW (ref >60)
Glucose, Bld: 137 mg/dL — ABNORMAL HIGH (ref 70–99)
Potassium: 4.1 mmol/L (ref 3.5–5.1)
Sodium: 140 mmol/L (ref 135–145)
Total Bilirubin: 0.4 mg/dL (ref 0.3–1.2)
Total Protein: 6 g/dL — ABNORMAL LOW (ref 6.5–8.1)

## 2021-05-25 LAB — CBC WITH DIFFERENTIAL/PLATELET
Abs Immature Granulocytes: 0.11 10*3/uL — ABNORMAL HIGH (ref 0.00–0.07)
Basophils Absolute: 0.1 10*3/uL (ref 0.0–0.1)
Basophils Relative: 1 %
Eosinophils Absolute: 0.2 10*3/uL (ref 0.0–0.5)
Eosinophils Relative: 2 %
HCT: 38.6 % — ABNORMAL LOW (ref 39.0–52.0)
Hemoglobin: 12.5 g/dL — ABNORMAL LOW (ref 13.0–17.0)
Immature Granulocytes: 1 %
Lymphocytes Relative: 22 %
Lymphs Abs: 2.2 10*3/uL (ref 0.7–4.0)
MCH: 31.8 pg (ref 26.0–34.0)
MCHC: 32.4 g/dL (ref 30.0–36.0)
MCV: 98.2 fL (ref 80.0–100.0)
Monocytes Absolute: 1.6 10*3/uL — ABNORMAL HIGH (ref 0.1–1.0)
Monocytes Relative: 16 %
Neutro Abs: 5.6 10*3/uL (ref 1.7–7.7)
Neutrophils Relative %: 58 %
Platelets: 789 10*3/uL — ABNORMAL HIGH (ref 150–400)
RBC: 3.93 MIL/uL — ABNORMAL LOW (ref 4.22–5.81)
RDW: 14.7 % (ref 11.5–15.5)
WBC: 9.8 10*3/uL (ref 4.0–10.5)
nRBC: 0 % (ref 0.0–0.2)

## 2021-05-25 LAB — MAGNESIUM: Magnesium: 1.9 mg/dL (ref 1.7–2.4)

## 2021-05-25 LAB — PHOSPHORUS: Phosphorus: 2.7 mg/dL (ref 2.5–4.6)

## 2021-05-25 MED ORDER — POLYETHYLENE GLYCOL 3350 17 G PO PACK: 17.0000 g | PACK | Freq: Every day | ORAL | Status: DC

## 2021-05-25 MED ORDER — SODIUM CHLORIDE 0.9 % IV SOLN: INTRAVENOUS | Status: DC

## 2021-05-25 NOTE — Progress Notes (Signed)
[facility] Gastroenterology Progress Note  Kevin Villegas 80 y.o. 16-Jul-1941  CC:  Colonic obstruction   Subjective: Doing well this morning, hoping to be able to go home soon.  Had diarrhea through the night due to gastrografin enema yesterday.  Patient on full liquids, no nausea, no AB pain, no fever, chills. No rectal bleeding.  Enema showed severe narrowing proximal colon which corresponds to CT and colonoscopy-biopsies negative for dysplasia or malignancy.  Benign colonic mucosa..  Scheduled for colonic stent tomorrow with Dr. Watt Climes and plan is to start gemcitabine/Abraxane with Dr. Marin Olp next week.   Final Path EGD/Colon  A. SMALL BOWEL, BIOPSY:  - Peptic duodenitis.  - No dysplasia or malignancy.   B. STOMACH, BIOPSY:  - Reactive gastropathy.  - Warthin-Starry is negative for Helicobacter pylori.  - No intestinal metaplasia, dysplasia, or malignancy.   C. ESOPHAGUS, BIOPSY:  - Scant squamous epithelium with acute inflammation.  - PAS is negative for fungal organisms.  - No intestinal metaplasia, dysplasia, or malignancy.   D. COLON, ABNORMAL, BIOPSY:  - Benign colonic mucosa with superficial extravasated red blood cells.  - No active inflammation or evidence of microscopic colitis.  - No dysplasia or malignancy.  ROS : Review of Systems  Constitutional: Negative for chills and fever.  HENT: Negative for hearing loss.   Respiratory: Negative for shortness of breath.   Cardiovascular: Negative for chest pain and leg swelling.  Gastrointestinal: Positive for diarrhea. Negative for abdominal pain, blood in stool, constipation, melena, nausea and vomiting.  Psychiatric/Behavioral: Negative for memory loss.      Objective: Vital signs in last 24 hours: Vitals:   05/24/21 2305 05/25/21 0549  BP: (!) 174/95 129/82  Pulse: 67 (!) 58  Resp:  18  Temp:  98.2 F (36.8 C)  SpO2:  96%    Physical Exam: Physical Exam Constitutional:      General: He is not in acute  distress.    Appearance: He is well-developed.  Cardiovascular:     Rate and Rhythm: Normal rate and regular rhythm.     Heart sounds: Normal heart sounds.  Pulmonary:     Effort: Pulmonary effort is normal.     Breath sounds: Normal breath sounds.  Abdominal:     General: There is no distension.     Palpations: Abdomen is soft.     Tenderness: There is no abdominal tenderness.  Neurological:     General: No focal deficit present.     Mental Status: He is alert and oriented to person, place, and time.      Lab Results: Recent Labs    05/23/21 0546 05/24/21 0502  NA 140 140  K 3.9 4.0  CL 109 109  CO2 27 26  GLUCOSE 121* 118*  BUN 10 10  CREATININE 1.56* 1.63*  CALCIUM 8.1* 8.2*  MG 1.7 1.8  PHOS 2.6 2.7   Recent Labs    05/23/21 0546 05/24/21 0502  AST 27 27  ALT 23 23  ALKPHOS 39 40  BILITOT 0.5 0.4  PROT 5.4* 5.5*  ALBUMIN 2.7* 2.7*   Recent Labs    05/23/21 0546 05/24/21 0502  WBC 7.2 9.3  NEUTROABS 3.4 4.8  HGB 11.3* 11.6*  HCT 35.3* 36.2*  MCV 99.7 98.6  PLT 764* 792*   No results for input(s): LABPROT, INR in the last 72 hours.  Lab Results: Results for orders placed or performed during the hospital encounter of 05/18/21 (from the past 48 hour(s))  CBC with Differential/Platelet  Status: Abnormal   Collection Time: 05/24/21  5:02 AM  Result Value Ref Range   WBC 9.3 4.0 - 10.5 K/uL   RBC 3.67 (L) 4.22 - 5.81 MIL/uL   Hemoglobin 11.6 (L) 13.0 - 17.0 g/dL   HCT 36.2 (L) 39.0 - 52.0 %   MCV 98.6 80.0 - 100.0 fL   MCH 31.6 26.0 - 34.0 pg   MCHC 32.0 30.0 - 36.0 g/dL   RDW 14.5 11.5 - 15.5 %   Platelets 792 (H) 150 - 400 K/uL   nRBC 0.0 0.0 - 0.2 %   Neutrophils Relative % 50 %   Neutro Abs 4.8 1.7 - 7.7 K/uL   Lymphocytes Relative 27 %   Lymphs Abs 2.5 0.7 - 4.0 K/uL   Monocytes Relative 17 %   Monocytes Absolute 1.5 (H) 0.1 - 1.0 K/uL   Eosinophils Relative 4 %   Eosinophils Absolute 0.3 0.0 - 0.5 K/uL   Basophils Relative 1 %    Basophils Absolute 0.1 0.0 - 0.1 K/uL   Immature Granulocytes 1 %   Abs Immature Granulocytes 0.06 0.00 - 0.07 K/uL    Comment: Performed at [facility], [place] [address], [place], [place] [address]  Comprehensive metabolic panel     Status: Abnormal   Collection Time: 05/24/21  5:02 AM  Result Value Ref Range   Sodium 140 135 - 145 mmol/L   Potassium 4.0 3.5 - 5.1 mmol/L   Chloride 109 98 - 111 mmol/L   CO2 26 22 - 32 mmol/L   Glucose, Bld 118 (H) 70 - 99 mg/dL    Comment: Glucose reference range applies only to samples taken after fasting for at least 8 hours.   BUN 10 8 - 23 mg/dL   Creatinine, Ser 1.63 (H) 0.61 - 1.24 mg/dL   Calcium 8.2 (L) 8.9 - 10.3 mg/dL   Total Protein 5.5 (L) 6.5 - 8.1 g/dL   Albumin 2.7 (L) 3.5 - 5.0 g/dL   AST 27 15 - 41 U/L   ALT 23 0 - 44 U/L   Alkaline Phosphatase 40 38 - 126 U/L   Total Bilirubin 0.4 0.3 - 1.2 mg/dL   GFR, Estimated 43 (L) >60 mL/min    Comment: (NOTE) Calculated using the CKD-EPI Creatinine Equation (2021)    Anion gap 5 5 - 15    Comment: Performed at [facility], [place] [address], [place], [place] [address]  Magnesium     Status: None   Collection Time: 05/24/21  5:02 AM  Result Value Ref Range   Magnesium 1.8 1.7 - 2.4 mg/dL    Comment: Performed at [facility], [place] [address] [place], [place] [address]  Phosphorus     Status: None   Collection Time: 05/24/21  5:02 AM  Result Value Ref Range   Phosphorus 2.7 2.5 - 4.6 mg/dL    Comment: Performed at [facility], [place] [address], [place], [place] [address]    Studies/Results: DG BE (COLON)W SINGLE CM (SOL OR THIN BA)  Result Date: 05/24/2021 CLINICAL DATA:  Sigmoid stricture. EXAM: ESOPHOGRAM/BARIUM SWALLOW TECHNIQUE: Single contrast examination was performed using 50% diluted water soluble contrast. FLUOROSCOPY TIME:  Radiation Exposure Index (if provided by the fluoroscopic device): 559.8 mGy.  COMPARISON:  May 18, 2021. FINDINGS: There is noted a moderate persistent stricture near the rectosigmoid junction. Extensive diverticulosis is noted in the sigmoid colon which significantly obscures evaluation of the colon. Initially contrast would not  advanced through a portion of the proximal sigmoid colon which grossly corresponds to stricture seen on CT scan. Eventually contrast did pass through with filling of the more proximal descending colon, which is full of stool. Evaluation of the stricture is limited due to overlapping loops of colon. There does appear to be a moderate to severe narrowing involving the proximal sigmoid colon which potentially may dilated some, but this cannot be said with certainty due to above limitations. IMPRESSION: Difficult exam due to overlying loops of colon as well as extensive diverticulosis. There does appear to be a moderate to severe narrowing or stricture involving the proximal sigmoid colon which corresponds roughly to the abnormality seen on prior CT scan. Eventually contrast was passed through this area into the more proximal descending colon, which is full of stool. It cannot be said with certainty if there was any significant dilatation or change in caliber with this stricture throughout the exam. As noted in the findings, there is also a moderate persistent stricture in the region of the rectosigmoid junction which does not appear to cause significant obstruction. Electronically Signed   By: Marijo Conception M.D.   On: 05/24/2021 14:59    Assessment/Plan: Colonic obstruction: Flex sig 6/2 revealed asevere stenosis in the sigmoid colon,non-traversed.  Very minimal confirm this.  Biopsy showed benign colonic mucosa no dysplasia or malignancy. Patient will go tomorrow for colonic stent with Dr. 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 Marin Olp who is following   Vladimir Crofts PA-C 05/25/2021, 8:20 AM  Contact #  [phone]

## 2021-05-25 NOTE — Progress Notes (Signed)
Mr. Mamaril is doing okay this morning.  He had the Gastrografin enema yesterday.  Gastroenterology will place a stent tomorrow.  Hopefully, he will be able to go home afterwards.  A Gastrografin enema showed a severe narrowing involving the proximal sigmoid colon which corresponds to the abnormality seen on prior CT scan.  Contrast eventually passed through this area.  Hopefully, the stent will be able to help him go to the bathroom and will help him be able to eat more regular food.  His labs were done today.  His albumin yesterday was 2.7.  He has had no fever.  He has had no vomiting.  There is been no bleeding.  We will just have to await the placement of the stent.  We will set him up with treatment to start next week.  He will be with gemcitabine/Abraxane.  Of note, he is losing his hair.  This was due to the Abraxane that he has received already.  I appreciate all the outstanding care that he is getting from everybody up [facility] [address].  Lattie Haw, MD  1 Thessalonians 5:16-18

## 2021-05-25 NOTE — Progress Notes (Signed)
PROGRESS NOTE    Kevin Villegas  EUM:[id number] DOB: 1941/05/29 DOA: 05/18/2021 PCP: Dettinger, [name], MD   Brief Narrative:  The patient is a 79 year old Caucasian male with a past medical history significant for but not limited to pancreatic cancer and other comorbidities who presented with abdominal pain started 2 nights prior to admission.  Patient states that he had severe sharp lower abdominal pain and described as an 8 out of 10 in severity and stated that it came in 32nd episode.  He reports that he had a weeks worth of constipation prior to this.  He went to the ED and was given laxatives and went home with some improvement in his symptoms.  He states that late yesterday he developed a new abdominal pain and this was epigastric rating to her back.  He described it like a sharp knife type sensation and states it was worsened with food.  He felt as if he had pancreatitis again so he came to the ED for help.  In the ED he had normal lab work but CT of the abdomen pelvis showed progressive metastatic disease with new and enlarging pulmonary nodules and enlarging mesenteric soft tissue mass at the duodenal jejunal junction as well as distention of the distal small bowel and colon which changes in caliber at a thick-walled segment of the proximal sigmoid colon as well as distal colonic obstruction that could not be excluded.  There was also pneumatosis of the cecum and proximal ascending colon that had a differential of bowel ischemia or infection as well as right-sided obstructive uropathy due to an 8 mm obstructing distal right ureteral calculus.  General surgery was consulted as well as gastroenterology as well as medical oncology.  The  patient underwent an EGD and flex sigmoidoscopy.  The plan was to have the patient have another CT scan of the abdomen pelvis with oral contrast however after further discussion the gastroenterologist spoke with the radiologist Dr. Ilda Foil and it was felt that the CT  with oral contrast would not likely be very helpful and the best way to assess the stricture would be likely the Gastrografin enema and this will be done on Monday, 05/24/2021.  Patient underwent a Gastrografin enema yesterday but is a difficult exam due to overlying loops of the colon as well as extensive diverticulosis.  There appeared to be a moderate to severe narrowing or stricture involving the proximal sigmoid colon which corresponds to roughly abnormality seen on prior CT scan.  Eventually contrast was passed through this area into the more proximal ascending colon which is full of stool.  As noted in the findings there is a moderate persistent stricture in the region of the rectosigmoid junction which does not appear to cause significant obstruction.  GI evaluated and planning for colonic stent in the morning.  Patient to continue on liquids today and get an enema prior to the procedure tomorrow and the plan is for the patient to resume chemotherapy next week.  Assessment & Plan:   Active Problems:   Colon obstruction (HCC)  Metastatic Pancreatic Cancer -CT of the abdomen pelvis done and showed progressive metastatic disease with new and enlarging pulmonary nodules as well as enlarging mesenteric soft tissue mass at the duodenal jejunal junction as well as the distal tension of the distal small bowel and colon -Medical oncology was consulted and patient had recently been started on systemic chemotherapy with gemcitabine and Abraxane and tolerated his first cycle overall with exception of constipation -Medical oncology  feels that that his disease progression occurred prior to the chemotherapy starting and recommend for him to resume chemotherapy once he is recovered from his hospitalization as an outpatient -Patient had surgical intervention with [facility] in 2018 -Patient primarily seen oncology care at [facility] but has also seen Dr. Marin Olp in the past -Continue pain control with IV morphine -Dr.  Marin Olp is following here and does not feel as if his obstruction is cancer related  -Plan is to resume treatment next week with gemcitabine/Abraxane  Bowel/Colonic Obstruction in the setting of Sigmoid Colon Stricture Abdominal Pain Constipation -Patient was admitted to telemetry inpatient -General Surgery was consulted and appreciate further evaluation and assistance; they recommended bowel rest and IV antibiotics and recommending GI consult to evaluate the colon further -Continue IV Zosyn -Gastroenterology evaluating and reviewed his scans which showed pneumatosis of the cecum and proximal ascending colon -GI took the patient for EGD and flexible sigmoidoscopy today; patient's EGD showed esophagitis, gastritis and stenosis and inflammation in the third and fourth portion of the duodenum and biopsies were taken.  His flexible sigmoidoscopy was extremely difficult and Dr. Alessandra Bevels was not able to advance the scope beyond the sigmoid colon stricture and there is some associated inflammation.  The scope was changed to ultraslin without any success so she recommends full liquid diet today continue fleets enemas and consider barium enema tomorrow and follow with biopsies -They feel the patient would be high risk for full colonoscopy given his pneumatosis -Continue with liquid diet for now -Biopsies from flex sig are available now; Please see GI Progress note from 05/25/21 -Patient underwent a Gastrografin enema yesterday and will be going for a colonic stent placement tomorrow  Obstructing Right Ureteral Stone -He is currently asymptomatic from his right ureteral stone and will need to follow-up with urology in outpatient setting given that Dr. Marylyn Ishihara spoke with urology and they felt that the patient can follow-up in outpatient if his UA was normal and his pain was controlled; UA is relatively unremarkable but he does have an AKI as below -Check for hydronephrosis via renal ultrasound but patient has  refused -May need urology input as an inpatient if creatinine continues to worsen however is improving now  Hypophosphatemia -Patient's phosphorus level has gone from 1.9 - > 2.8 -> 2.5 -> 2.6 -> 2.7 x2 -Replete with po K Phos NAK -Continue to Monitor and Trend  -Repeat Phos Level in the AM   Thrombocytosis  -Patient's Platelet Count has gone from 514 -> 596 -> 788 -> 655 -> 753 -> 764 -> 792 and is now 789 -Continue to Monitor and Trend -Repeat CBC in the AM  Normocytic Anemia -Patient hemoglobin/hematocrit has dropped from 14.9/43.9 on admission and is now 12.5/30.6 -Possibly dilutional drop  -Check anemia panel in the a.m. -We will continue to monitor for signs and symptoms of bleeding; currently no overt bleeding noted -Repeat CBC in a.m.  AKI Metabolic Acidosis -Patient's BUNs/creatinine went from 26/1.71 -> 9/0.70 -> 44/2.00 -> 31/1.95 -> 19/1.60 -> 13/1.42 -> 10/1.56 -> 10/1.63 and is now 8/1.51 -The patient's acidosis has improved and he now has a CO2 of 25, anion gap of 6, chloride level of 109 -We will continue IV fluid hydration with D5 normal saline at 75 MLS per hour -Will check renal ultrasound to evaluate for hydronephrosis again however patient has refused this -Avoid further nephrotoxic medications, contrast dyes, hypotension and renally adjust medications  -Repeat CMP in a.m.  DVT prophylaxis: SCDs Code Status: Partial Code  Family Communication: No family present at bedside Disposition Plan: Pending Further Clearance by GI and General Surgery   Status is: Inpatient  Remains inpatient appropriate because:Unsafe d/c plan, IV treatments appropriate due to intensity of illness or inability to take PO and Inpatient level of care appropriate due to severity of illness   Dispo: The patient is from: Home              Anticipated d/c is to: Home              Patient currently is not medically stable to d/c.   Difficult to place patient No  Consultants:    Medical Oncology  General Surgery  Gastroenterology   Procedures:  EGD  Findings:      LA Grade C (one or more mucosal breaks continuous between tops of 2 or       more mucosal folds, less than 75% circumference) esophagitis with no       bleeding was found in the mid and distal esophagus. Biopsies were taken       with a cold forceps for histology.      A small hiatal hernia was present.      Scattered severe inflammation characterized by congestion (edema),       erosions, erythema and friability was found in the gastric body and in       the gastric antrum. Biopsies were taken with a cold forceps for       histology.      The cardia and gastric fundus were normal on retroflexion.      An acquired mild stenosis was found in the third and fourth portion of       the duodenum with inflmation and was traversed. Biopsies were taken with       a cold forceps for histology. Impression:               - LA Grade C esophagitis with no bleeding. Biopsied.                           - Gastritis. Biopsied.                           - Acquired duodenal stenosis. Biopsied.  FLEX SIGMOIDOSCOPY Findings:      The perianal and digital rectal examinations were normal.      A severe stenosis was found in the sigmoid colon and was non-traversed.       Biopsies were taken with a cold forceps for histology.There was       significant tortuosity and narrowing in the sigmoid colon at around 30       to 35 cm. Not able to advance pediatric colonoscopy or ultraslim scope       despite abdominal pressure and changing patient's position to supine       position. There was some inflammation and erythema at this area as well       as some inflammation in the rectosigmoid colon. Biopsies were taken.      Multiple small and large-mouthed diverticula were found in the sigmoid       colon.      Internal hemorrhoids were found during retroflexion. The hemorrhoids       were medium-sized. Impression:                - Stricture in the sigmoid colon. Biopsied.                           -  Diverticulosis in the sigmoid colon.                           - Internal hemorrhoids. Moderate Sedation:      Moderate (conscious) sedation was personally administered by an       anesthesia professional. The following parameters were monitored: oxygen       saturation, heart rate, blood pressure, and response to care. Recommendation:           - Return patient to hospital ward for ongoing care.                           - Full liquid diet.                           - Await pathology results.                           - Perform a barium enema tomorrow.   Antimicrobials:  Anti-infectives (From admission, onward)   Start     Dose/Rate Route Frequency Ordered Stop   05/18/21 1300  piperacillin-tazobactam (ZOSYN) IVPB 3.375 g  Status:  Discontinued        3.375 g 12.5 mL/hr over 240 Minutes Intravenous Every 8 hours 05/18/21 1216 05/24/21 0911   05/18/21 0330  piperacillin-tazobactam (ZOSYN) IVPB 3.375 g        3.375 g 100 mL/hr over 30 Minutes Intravenous  Once 05/18/21 0326 05/18/21 0452        Subjective: Seen and examined at bedside and states that after his Gastrografin enema he had a lot of diarrhea.  No nausea or vomiting.  Noted to get a stent tomorrow.  No other concerns or complaints at this time.  Objective: Vitals:   05/24/21 1508 05/24/21 2152 05/24/21 2305 05/25/21 0549  BP: (!) 141/101 (!) 180/104 (!) 174/95 129/82  Pulse: 73 65 67 (!) 58  Resp: 17 16  18   Temp: 98.1 F (36.7 C) 98.1 F (36.7 C)  98.2 F (36.8 C)  TempSrc: Oral Oral  Oral  SpO2: 97% 94%  96%  Weight:      Height:        Intake/Output Summary (Last 24 hours) at 05/25/2021 1119 Last data filed at 05/24/2021 1748 Gross per 24 hour  Intake 819.88 ml  Output --  Net 819.88 ml   Filed Weights   05/18/21 0125  Weight: 74.4 kg   Examination: Physical Exam:  Constitutional: The patient is a thin elderly Caucasian  male currently no acute distress appears calm Eyes: Lids and conjunctivae normal, sclerae anicteric  ENMT: External Ears, Nose appear normal. Grossly normal hearing. Neck: Appears normal, supple, no cervical masses, normal ROM, no appreciable thyromegaly; no JVD Respiratory: Diminished to auscultation bilaterally, no wheezing, rales, rhonchi or crackles. Normal respiratory effort and patient is not tachypenic. No accessory muscle use.  Unlabored breathing Cardiovascular: RRR, no murmurs / rubs / gallops. S1 and S2 auscultated. No extremity edema. 2+ pedal pulses. No carotid bruits.  Abdomen: Soft, non-tender, non-distended.  Bowel sounds positive.  GU: Deferred. Musculoskeletal: No clubbing / cyanosis of digits/nails. No joint deformity upper and lower extremities.  Skin: No rashes, lesions, ulcers on limited skin evaluation. No induration; Warm and dry.  Neurologic: CN 2-12 grossly intact with no focal deficits. Romberg sign and cerebellar reflexes not assessed.  Psychiatric: Normal judgment and insight. Alert and oriented x 3. Normal mood and appropriate affect.   Data Reviewed: I have personally reviewed following labs and imaging studies  CBC: Recent Labs  Lab 05/21/21 0609 05/22/21 0444 05/23/21 0546 05/24/21 0502 05/25/21 1003  WBC 5.9 6.9 7.2 9.3 9.8  NEUTROABS 2.8 3.1 3.4 4.8 5.6  HGB 11.3* 11.6* 11.3* 11.6* 12.5*  HCT 35.2* 35.6* 35.3* 36.2* 38.6*  MCV 98.9 98.3 99.7 98.6 98.2  PLT 655* 753* 764* 792* 270*   Basic Metabolic Panel: Recent Labs  Lab 05/21/21 0609 05/22/21 0444 05/23/21 0546 05/24/21 0502 05/25/21 1003  NA 138 140 140 140 140  K 4.0 3.7 3.9 4.0 4.1  CL 109 109 109 109 109  CO2 23 25 27 26 25   GLUCOSE 125* 119* 121* 118* 137*  BUN 19 13 10 10 8   CREATININE 1.60* 1.42* 1.56* 1.63* 1.51*  CALCIUM 7.9* 8.0* 8.1* 8.2* 8.4*  MG 1.9 1.8 1.7 1.8 1.9  PHOS 2.8 2.5 2.6 2.7 2.7   GFR: Estimated Creatinine Clearance: 41.7 mL/min (A) (by C-G formula based  on SCr of 1.51 mg/dL (H)). Liver Function Tests: Recent Labs  Lab 05/21/21 0609 05/22/21 0444 05/23/21 0546 05/24/21 0502 05/25/21 1003  AST 16 22 27 27 29   ALT 18 18 23 23 25   ALKPHOS 43 44 39 40 50  BILITOT 0.3 0.2* 0.5 0.4 0.4  PROT 5.4* 5.3* 5.4* 5.5* 6.0*  ALBUMIN 2.7* 2.7* 2.7* 2.7* 2.9*   No results for input(s): LIPASE, AMYLASE in the last 168 hours. No results for input(s): AMMONIA in the last 168 hours. Coagulation Profile: No results for input(s): INR, PROTIME in the last 168 hours. Cardiac Enzymes: No results for input(s): CKTOTAL, CKMB, CKMBINDEX, TROPONINI in the last 168 hours. BNP (last 3 results) No results for input(s): PROBNP in the last 8760 hours. HbA1C: No results for input(s): HGBA1C in the last 72 hours. CBG: No results for input(s): GLUCAP in the last 168 hours. Lipid Profile: No results for input(s): CHOL, HDL, LDLCALC, TRIG, CHOLHDL, LDLDIRECT in the last 72 hours. Thyroid Function Tests: No results for input(s): TSH, T4TOTAL, FREET4, T3FREE, THYROIDAB in the last 72 hours. Anemia Panel: No results for input(s): VITAMINB12, FOLATE, FERRITIN, TIBC, IRON, RETICCTPCT in the last 72 hours. Sepsis Labs: No results for input(s): PROCALCITON, LATICACIDVEN in the last 168 hours.  Recent Results (from the past 240 hour(s))  Resp Panel by RT-PCR (Flu A&B, Covid) Nasopharyngeal Swab     Status: None   Collection Time: 05/18/21  3:49 AM   Specimen: Nasopharyngeal Swab; Nasopharyngeal(NP) swabs in vial transport medium  Result Value Ref Range Status   SARS Coronavirus 2 by RT PCR NEGATIVE NEGATIVE Final    Comment: (NOTE) SARS-CoV-2 target nucleic acids are NOT DETECTED.  The SARS-CoV-2 RNA is generally detectable in upper respiratory specimens during the acute phase of infection. The lowest concentration of SARS-CoV-2 viral copies this assay can detect is 138 copies/mL. A negative result does not preclude SARS-Cov-2 infection and should not be used as  the sole basis for treatment or other patient management decisions. A negative result may occur with  improper specimen collection/handling, submission of specimen other than nasopharyngeal swab, presence of viral mutation(s) within the areas targeted by this assay, and inadequate number of viral copies(<138 copies/mL). A negative result must be combined with clinical observations, patient history, and epidemiological information. The expected result is Negative.  Fact Sheet for Patients:  EntrepreneurPulse.com.au  Fact Sheet for Healthcare Providers:  IncredibleEmployment.be  This test is no t yet approved or cleared by the [place] and  has been authorized for detection and/or diagnosis of SARS-CoV-2 by FDA under an Emergency Use Authorization (EUA). This EUA will remain  in effect (meaning this test can be used) for the duration of the COVID-19 declaration under Section 564(b)(1) of the Act, 21 U.S.C.section 360bbb-3(b)(1), unless the authorization is terminated  or revoked sooner.       Influenza A by PCR NEGATIVE NEGATIVE Final   Influenza B by PCR NEGATIVE NEGATIVE Final    Comment: (NOTE) The Xpert Xpress SARS-CoV-2/FLU/RSV plus assay is intended as an aid in the diagnosis of influenza from Nasopharyngeal swab specimens and should not be used as a sole basis for treatment. Nasal washings and aspirates are unacceptable for Xpert Xpress SARS-CoV-2/FLU/RSV testing.  Fact Sheet for Patients: EntrepreneurPulse.com.au  Fact Sheet for Healthcare Providers: IncredibleEmployment.be  This test is not yet approved or cleared by the [place] FDA and has been authorized for detection and/or diagnosis of SARS-CoV-2 by FDA under an Emergency Use Authorization (EUA). This EUA will remain in effect (meaning this test can be used) for the duration of the COVID-19 declaration under Section 564(b)(1) of  the Act, 21 U.S.C. section 360bbb-3(b)(1), unless the authorization is terminated or revoked.  Performed at [facility], [address] [place], [place] [address]     RN Pressure Injury Documentation:     Estimated body mass index is 22.88 kg/m as calculated from the following:   Height as of this encounter: 5\' 11"  (1.803 m).   Weight as of this encounter: 74.4 kg.  Malnutrition Type: Nutrition Problem: Increased nutrient needs Etiology: cancer and cancer related treatments Malnutrition Characteristics: Signs/Symptoms: estimated needs Nutrition Interventions: Interventions: Ensure Enlive (each supplement provides 350kcal and 20 grams of protein),Magic cup,MVI Radiology Studies: DG BE (COLON)W SINGLE CM (SOL OR THIN BA)  Result Date: 05/24/2021 CLINICAL DATA:  Sigmoid stricture. EXAM: ESOPHOGRAM/BARIUM SWALLOW TECHNIQUE: Single contrast examination was performed using 50% diluted water soluble contrast. FLUOROSCOPY TIME:  Radiation Exposure Index (if provided by the fluoroscopic device): 559.8 mGy. COMPARISON:  May 18, 2021. FINDINGS: There is noted a moderate persistent stricture near the rectosigmoid junction. Extensive diverticulosis is noted in the sigmoid colon which significantly obscures evaluation of the colon. Initially contrast would not advanced through a portion of the proximal sigmoid colon which grossly corresponds to stricture seen on CT scan. Eventually contrast did pass through with filling of the more proximal descending colon, which is full of stool. Evaluation of the stricture is limited due to overlapping loops of colon. There does appear to be a moderate to severe narrowing involving the proximal sigmoid colon which potentially may dilated some, but this cannot be said with certainty due to above limitations. IMPRESSION: Difficult exam due to overlying loops of colon as well as extensive diverticulosis. There does appear to be a moderate to severe narrowing  or stricture involving the proximal sigmoid colon which corresponds roughly to the abnormality seen on prior CT scan. Eventually contrast was passed through this area into the more proximal descending colon, which is full of stool. It cannot be said with certainty if there was any significant dilatation or change in caliber with this stricture throughout the exam. As noted in the findings, there is also a moderate persistent stricture in the region of the rectosigmoid junction which does not appear to cause significant obstruction. Electronically Signed   By: Marijo Conception  M.D.   On: 05/24/2021 14:59   Scheduled Meds: . Chlorhexidine Gluconate Cloth  6 each Topical Daily  . diatrizoate meglumine-sodium  300 mL Rectal Once  . feeding supplement  237 mL Oral TID BM  . multivitamin with minerals  1 tablet Oral Daily  . [START ON 05/26/2021] polyethylene glycol  17 g Oral Daily  . potassium & sodium phosphates  1 packet Oral TID WC & HS  . sodium phosphate  1 enema Rectal Once   Continuous Infusions: . dextrose 5 % and 0.9% NaCl 75 mL/hr at 05/24/21 2314    LOS: 7 days   Kerney Elbe, DO [facility] PAGER is on [place]  If 7PM-7AM, please contact night-coverage [email or web address]

## 2021-05-26 ENCOUNTER — Inpatient Hospital Stay (HOSPITAL_COMMUNITY): Payer: Medicare Other | Admitting: Certified Registered Nurse Anesthetist

## 2021-05-26 ENCOUNTER — Encounter (HOSPITAL_COMMUNITY)
Admission: EM | Disposition: A | Discharge status: Home / Self Care | Payer: Self-pay | Source: Home / Self Care | Attending: Internal Medicine

## 2021-05-26 ENCOUNTER — Encounter (HOSPITAL_COMMUNITY): Payer: Self-pay | Admitting: Family Medicine

## 2021-05-26 ENCOUNTER — Inpatient Hospital Stay (HOSPITAL_COMMUNITY): Payer: Medicare Other

## 2021-05-26 DIAGNOSIS — K56699 Other intestinal obstruction unspecified as to partial versus complete obstruction: Secondary | ICD-10-CM

## 2021-05-26 DIAGNOSIS — K6389 Other specified diseases of intestine: Secondary | ICD-10-CM

## 2021-05-26 HISTORY — PX: FLEXIBLE SIGMOIDOSCOPY: SHX5431

## 2021-05-26 HISTORY — PX: COLONIC STENT PLACEMENT: SHX5542

## 2021-05-26 LAB — RETICULOCYTES
Immature Retic Fract: 11.6 % (ref 2.3–15.9)
RBC.: 3.77 MIL/uL — ABNORMAL LOW (ref 4.22–5.81)
Retic Count, Absolute: 72 10*3/uL (ref 19.0–186.0)
Retic Ct Pct: 1.9 % (ref 0.4–3.1)

## 2021-05-26 LAB — COMPREHENSIVE METABOLIC PANEL
ALT: 26 U/L (ref 0–44)
AST: 30 U/L (ref 15–41)
Albumin: 2.8 g/dL — ABNORMAL LOW (ref 3.5–5.0)
Alkaline Phosphatase: 48 U/L (ref 38–126)
Anion gap: 5 (ref 5–15)
BUN: 7 mg/dL — ABNORMAL LOW (ref 8–23)
CO2: 26 mmol/L (ref 22–32)
Calcium: 8.7 mg/dL — ABNORMAL LOW (ref 8.9–10.3)
Chloride: 108 mmol/L (ref 98–111)
Creatinine, Ser: 1.66 mg/dL — ABNORMAL HIGH (ref 0.61–1.24)
GFR, Estimated: 42 mL/min — ABNORMAL LOW (ref >60)
Glucose, Bld: 107 mg/dL — ABNORMAL HIGH (ref 70–99)
Potassium: 4.3 mmol/L (ref 3.5–5.1)
Sodium: 139 mmol/L (ref 135–145)
Total Bilirubin: 0.5 mg/dL (ref 0.3–1.2)
Total Protein: 5.8 g/dL — ABNORMAL LOW (ref 6.5–8.1)

## 2021-05-26 LAB — CBC WITH DIFFERENTIAL/PLATELET
Abs Immature Granulocytes: 0.11 10*3/uL — ABNORMAL HIGH (ref 0.00–0.07)
Basophils Absolute: 0.1 10*3/uL (ref 0.0–0.1)
Basophils Relative: 1 %
Eosinophils Absolute: 0.3 10*3/uL (ref 0.0–0.5)
Eosinophils Relative: 3 %
HCT: 37 % — ABNORMAL LOW (ref 39.0–52.0)
Hemoglobin: 12.1 g/dL — ABNORMAL LOW (ref 13.0–17.0)
Immature Granulocytes: 1 %
Lymphocytes Relative: 29 %
Lymphs Abs: 2.9 10*3/uL (ref 0.7–4.0)
MCH: 32.3 pg (ref 26.0–34.0)
MCHC: 32.7 g/dL (ref 30.0–36.0)
MCV: 98.7 fL (ref 80.0–100.0)
Monocytes Absolute: 1.6 10*3/uL — ABNORMAL HIGH (ref 0.1–1.0)
Monocytes Relative: 16 %
Neutro Abs: 5 10*3/uL (ref 1.7–7.7)
Neutrophils Relative %: 50 %
Platelets: 708 10*3/uL — ABNORMAL HIGH (ref 150–400)
RBC: 3.75 MIL/uL — ABNORMAL LOW (ref 4.22–5.81)
RDW: 14.9 % (ref 11.5–15.5)
WBC: 10.1 10*3/uL (ref 4.0–10.5)
nRBC: 0 % (ref 0.0–0.2)

## 2021-05-26 LAB — VITAMIN B12: Vitamin B-12: 556 pg/mL (ref 180–914)

## 2021-05-26 LAB — FERRITIN: Ferritin: 88 ng/mL (ref 24–336)

## 2021-05-26 LAB — FOLATE: Folate: 13 ng/mL (ref >5.9)

## 2021-05-26 LAB — IRON AND TIBC
Iron: 70 ug/dL (ref 45–182)
Saturation Ratios: 26 % (ref 17.9–39.5)
TIBC: 272 ug/dL (ref 250–450)
UIBC: 202 ug/dL

## 2021-05-26 LAB — PHOSPHORUS: Phosphorus: 3.3 mg/dL (ref 2.5–4.6)

## 2021-05-26 LAB — MAGNESIUM: Magnesium: 1.9 mg/dL (ref 1.7–2.4)

## 2021-05-26 SURGERY — SIGMOIDOSCOPY, FLEXIBLE
Anesthesia: Monitor Anesthesia Care

## 2021-05-26 MED ORDER — ADULT MULTIVITAMIN W/MINERALS CH: 1.0000 | ORAL_TABLET | Freq: Every day | ORAL | Status: DC

## 2021-05-26 MED ORDER — LACTATED RINGERS IV SOLN: INTRAVENOUS | Status: DC | PRN

## 2021-05-26 MED ORDER — HEPARIN SOD (PORK) LOCK FLUSH 100 UNIT/ML IV SOLN
500.0000 [IU] | INTRAVENOUS | Status: AC | PRN
  Administered 2021-05-26: 500 [IU]
  Filled 2021-05-26: qty 5

## 2021-05-26 MED ORDER — EPHEDRINE SULFATE-NACL 50-0.9 MG/10ML-% IV SOSY
PREFILLED_SYRINGE | INTRAVENOUS | Status: DC | PRN
  Administered 2021-05-26 (×4): 10 mg via INTRAVENOUS

## 2021-05-26 MED ORDER — POLYETHYLENE GLYCOL 3350 17 G PO PACK: 17.0000 g | PACK | Freq: Two times a day (BID) | ORAL | 0 refills | Status: DC

## 2021-05-26 MED ORDER — PROPOFOL 500 MG/50ML IV EMUL
INTRAVENOUS | Status: AC
  Filled 2021-05-26: qty 50

## 2021-05-26 MED ORDER — PROPOFOL 500 MG/50ML IV EMUL
INTRAVENOUS | Status: DC | PRN
  Administered 2021-05-26: 100 ug/kg/min via INTRAVENOUS

## 2021-05-26 MED ORDER — PROPOFOL 10 MG/ML IV BOLUS
INTRAVENOUS | Status: DC | PRN
  Administered 2021-05-26: 20 mg via INTRAVENOUS
  Administered 2021-05-26: 30 mg via INTRAVENOUS
  Administered 2021-05-26: 20 mg via INTRAVENOUS

## 2021-05-26 MED ORDER — PROPOFOL 1000 MG/100ML IV EMUL
INTRAVENOUS | Status: AC
  Filled 2021-05-26: qty 300

## 2021-05-26 NOTE — Discharge Summary (Signed)
Physician Discharge Summary  Kevin Villegas TUU:[id number] DOB: 1941/10/01 DOA: 05/18/2021  PCP: Dettinger, [name], MD  Admit date: 05/18/2021 Discharge date: 05/26/2021  Admitted From:home Disposition: home  Recommendations for Outpatient Follow-up:  1. Follow up with PCP in 1-2 weeks 2. Please obtain BMP/CBC in one week 3. Follow up with dr Marin [facility] none Equipment/Devices:none Discharge Condition stable CODE STATUS:partial Diet recommendation: cardiac Brief/Interim Summary:80 year old Caucasian male with a past medical history significant for but not limited to pancreatic cancer and other comorbidities who presented with abdominal pain started 2 nights prior to admission.  Patient states that he had severe sharp lower abdominal pain and described as an 8 out of 10 in severity and stated that it came in 32nd episode.  He reports that he had a weeks worth of constipation prior to this.  He went to the ED and was given laxatives and went home with some improvement in his symptoms.  He states that late yesterday he developed a new abdominal pain and this was epigastric rating to her back.  He described it like a sharp knife type sensation and states it was worsened with food.  He felt as if he had pancreatitis again so he came to the ED for help.  In the ED he had normal lab work but CT of the abdomen pelvis showed progressive metastatic disease with new and enlarging pulmonary nodules and enlarging mesenteric soft tissue mass at the duodenal jejunal junction as well as distention of the distal small bowel and colon which changes in caliber at a thick-walled segment of the proximal sigmoid colon as well as distal colonic obstruction that could not be excluded.  There was also pneumatosis of the cecum and proximal ascending colon that had a differential of bowel ischemia or infection as well as right-sided obstructive uropathy due to an 8 mm obstructing distal right ureteral calculus.   General surgery was consulted as well as gastroenterology as well as medical oncology.  The  patient underwent an EGD and flex sigmoidoscopy.  The plan was to have the patient have another CT scan of the abdomen pelvis with oral contrast however after further discussion the gastroenterologist spoke with the radiologist Dr. Ilda Foil and it was felt that the CT with oral contrast would not likely be very helpful and the best way to assess the stricture would be likely the Gastrografin enema and this will be done on Monday, 05/24/2021.  Patient underwent a Gastrografin enema yesterday but is a difficult exam due to overlying loops of the colon as well as extensive diverticulosis.  There appeared to be a moderate to severe narrowing or stricture involving the proximal sigmoid colon which corresponds to roughly abnormality seen on prior CT scan.  Eventually contrast was passed through this area into the more proximal ascending colon which is full of stool.  As noted in the findings there is a moderate persistent stricture in the region of the rectosigmoid junction which does not appear to cause significant obstruction.    Discharge Diagnoses:  Active Problems:   Colon obstruction (HCC)     Metastatic Pancreatic Cancer -CT of the abdomen pelvis done and showed progressive metastatic disease with new and enlarging pulmonary nodules as well as enlarging mesenteric soft tissue mass at the duodenal jejunal junction as well as the distal tension of the distal small bowel and colon -Medical oncology was consulted and patient had recently been started on systemic chemotherapy with gemcitabine and Abraxane and tolerated his first cycle overall  with exception of constipation -Medical oncology feels that that his disease progression occurred prior to the chemotherapy starting and recommend for him to resume chemotherapy once he is recovered from his hospitalization as an outpatient -Patient had surgical intervention with  [facility] in 2018 -Patient primarily seen oncology care at [facility] but has also seen Dr. Marin Olp in the past -Dr. Marin Olp is following here and does not feel as if his obstruction is cancer related  -Plan is to resume treatment next week with gemcitabine/Abraxane  Bowel/Colonic Obstruction in the setting of Sigmoid Colon Stricture Abdominal Pain Constipation-patient was seen in consultation by GI and general surgery. -Gastroenterology evaluating and reviewed his scans which showed pneumatosis of the cecum and proximal ascending colon -GI took the patient for EGD and flexible sigmoidoscopy today; patient's EGD showed esophagitis, gastritis and stenosis and inflammation in the third and fourth portion of the duodenum and biopsies were taken.  His flexible sigmoidoscopy was extremely difficult and Dr. Alessandra Bevels was not able to advance the scope beyond the sigmoid colon stricture and there is some associated inflammation.  The scope was changed to ultraslin without any success so she recommended barium enema  -They feel the patient would be high risk for full colonoscopy given his pneumatosis -Biopsies from flex sig -Patient underwent a Gastrografin enema 05/24/2021 and had colonic stent placed 05/26/2021.  He tolerated a diet after the stent was placed and is discharged home.   Obstructing Right Ureteral Stone-follow-up with outpatient urology.  Patient refused renal ultrasound during this hospital stay.  His creatinine was 1.66 on discharge.  Hypophosphatemia resolved with replacement.  Thrombocytosis platelet count 708 on discharge.  Normocytic Anemia-hemoglobin 12.1 from 14.9 on admission likely dilutional.  No evidence of active bleeding noted.  Anemia panel was essentially normal.  AKI patient refused renal ultrasound.  Creatinine 1.66 on discharge.   Nutrition Problem: Increased nutrient needs Etiology: cancer and cancer related treatments    Signs/Symptoms: estimated  needs     Interventions: Ensure Enlive (each supplement provides 350kcal and 20 grams of protein),Magic cup,MVI  Estimated body mass index is 22.88 kg/m as calculated from the following:   Height as of this encounter: 5\' 11"  (1.803 m).   Weight as of this encounter: 74.4 kg.  Discharge Instructions  Discharge Instructions    Diet - low sodium heart healthy   Complete by: As directed    Increase activity slowly   Complete by: As directed      Allergies as of 05/26/2021      Reactions   Pollen Extract Other (See Comments)   Sinus congestion      Medication List    TAKE these medications   dicyclomine 20 MG tablet Commonly known as: BENTYL Take 0.5 tablets (10 mg total) by mouth every 6 (six) hours as needed (For abdominal cramping).   lidocaine-prilocaine cream Commonly known as: EMLA Apply 1 application topically as needed. Port access   multivitamin with minerals Tabs tablet Take 1 tablet by mouth daily.   ondansetron 8 MG tablet Commonly known as: ZOFRAN Take 8 mg by mouth every 8 (eight) hours as needed for nausea or vomiting.   polyethylene glycol 17 g packet Commonly known as: MiraLax Take 17 g by mouth 2 (two) times daily.   prochlorperazine 5 MG tablet Commonly known as: COMPAZINE Take 5 mg by mouth every 6 (six) hours as needed for nausea or vomiting.       Allergies  Allergen Reactions  . Pollen Extract Other (See Comments)  Sinus congestion    Consultations:  GI, general surgery, oncology   Procedures/Studies: CT ABDOMEN PELVIS WO CONTRAST  Result Date: 05/18/2021 CLINICAL DATA:  History of pancreatic cancer status post distal pancreatectomy, worsening abdominal pain EXAM: CT ABDOMEN AND PELVIS WITHOUT CONTRAST TECHNIQUE: Multidetector CT imaging of the abdomen and pelvis was performed following the standard protocol without IV contrast. COMPARISON:  11/24/2019 FINDINGS: Unenhanced CT was performed per clinician order. Lack of IV contrast  limits sensitivity and specificity, especially for evaluation of abdominal/pelvic solid viscera. Lower chest: There are numerous bibasilar pulmonary nodules which have increased in size and number since prior study, consistent with progressive pulmonary metastases. Index nodule in the right lower lobe measures 17 x 15 mm image 3/2, previously having measured 7 x 9 mm. No effusion or pneumothorax. Dense calcification of the aortic valve. Hepatobiliary: Stable liver hypodensities likely representing cysts. Gallbladder surgically absent. No biliary duct dilation. Pancreas: The head and uncinate process of the pancreas unremarkable. Distal pancreatectomy has been performed. Spleen: The spleen is surgically absent. Adrenals/Urinary Tract: There is progressive bilateral renal cortical atrophy. There is significant right-sided obstructive uropathy related to an obstructing 8 mm distal right ureteral calculus, reference image 71/2. No left-sided calculi. The bladder is minimally distended which limits its evaluation. The adrenals are grossly normal. Stomach/Bowel: There is pneumatosis involving the cecum, which could be due to ischemia or underlying infection. There is diffuse gaseous distension of the jejunum, ileum, and colon. There is change in the caliber of the distal colon with segmental narrowing and wall thickening of the proximal sigmoid colon, reference image 69 through 73, which could reflect postinflammatory scarring as there is diffuse diverticulosis in this region. Underlying neoplasm cannot be excluded. A metallic stent is seen at the duodenal jejunal junction left upper quadrant. Spiculated soft tissue mass within the mesentery surrounding the small bowel in this region measures 3.1 x 2.8 cm, consistent with metastatic disease and increased in size since prior study. Vascular/Lymphatic: Mild atherosclerosis of the aorta and its branches. No discrete pathologic adenopathy is identified on this unenhanced  exam. Reproductive: Prostate is unremarkable. Other: No free fluid or free intraperitoneal gas. No abdominal wall hernia. Musculoskeletal: No acute or destructive bony lesions. Reconstructed images demonstrate no additional findings. IMPRESSION: 1. Progressive metastatic disease, with new and enlarging pulmonary nodules and enlarging mesenteric soft tissue mass at the duodenal jejunal junction as above. 2. Distension of the distal small bowel and colon, with change in caliber at a thick-walled segment of proximal sigmoid colon. Distal colonic obstruction cannot be excluded, and colonoscopy may be useful to exclude underlying neoplasm in this region. 3. Pneumatosis of the cecum and proximal ascending colon. Differential diagnosis would include bowel ischemia or infection. No evidence of portal venous gas. 4. Right-sided obstructive uropathy due to an 8 mm obstructing distal right ureteral calculus. Critical Value/emergent results were called by telephone at the time of interpretation on 05/18/2021 at 3:28 am to provider DAN FLOYD , who verbally acknowledged these results. Electronically Signed   By: Randa Ngo M.D.   On: 05/18/2021 03:28   DG Fluoro Rm 1-60 Min  Result Date: 05/26/2021 CLINICAL DATA:  Colonic stent placement. EXAM: DG C-ARM 1-60 MIN CONTRAST:  None. FLUOROSCOPY TIME:  Fluoroscopy Time:  2 minutes 29 seconds Number of Acquired Spot Images: 6 COMPARISON:  Barium enema 05/24/2021. CT abdomen and pelvis 05/18/2021. FINDINGS: A stent has been placed over the region of the sigmoid colon. Narrowing of the distal portion of the stent noted.  IMPRESSION: Stent placement as above Electronically Signed   By: Marcello Moores  Register   On: 05/26/2021 12:29   DG ABD ACUTE 2+V W 1V CHEST  Result Date: 05/16/2021 CLINICAL DATA:  Abdominal pain, constipation, history of pancreatic cancer EXAM: DG ABDOMEN ACUTE WITH 1 VIEW CHEST COMPARISON:  CT abdomen/pelvis dated 11/24/2019 FINDINGS: Nodular opacities in the  bilateral upper lobes and left lower lobe, suspicious for metastases. Right chest power port terminates in the lower SVC. The heart is normal in size. Nonobstructive bowel gas pattern with gas and fluid-filled loops of colon in the upper abdomen. Cholecystectomy clips. Surgical sutures in the left upper abdomen. Metallic stent in the left mid abdomen. Mild degenerative changes of the lumbar spine. IMPRESSION: Multiple bilateral pulmonary nodules, suspicious for metastases. Nonobstructive bowel gas pattern. Postsurgical changes in the abdomen. Electronically Signed   By: Julian Hy M.D.   On: 05/16/2021 06:51   DG BE (COLON)W SINGLE CM (SOL OR THIN BA)  Result Date: 05/24/2021 CLINICAL DATA:  Sigmoid stricture. EXAM: ESOPHOGRAM/BARIUM SWALLOW TECHNIQUE: Single contrast examination was performed using 50% diluted water soluble contrast. FLUOROSCOPY TIME:  Radiation Exposure Index (if provided by the fluoroscopic device): 559.8 mGy. COMPARISON:  May 18, 2021. FINDINGS: There is noted a moderate persistent stricture near the rectosigmoid junction. Extensive diverticulosis is noted in the sigmoid colon which significantly obscures evaluation of the colon. Initially contrast would not advanced through a portion of the proximal sigmoid colon which grossly corresponds to stricture seen on CT scan. Eventually contrast did pass through with filling of the more proximal descending colon, which is full of stool. Evaluation of the stricture is limited due to overlapping loops of colon. There does appear to be a moderate to severe narrowing involving the proximal sigmoid colon which potentially may dilated some, but this cannot be said with certainty due to above limitations. IMPRESSION: Difficult exam due to overlying loops of colon as well as extensive diverticulosis. There does appear to be a moderate to severe narrowing or stricture involving the proximal sigmoid colon which corresponds roughly to the abnormality  seen on prior CT scan. Eventually contrast was passed through this area into the more proximal descending colon, which is full of stool. It cannot be said with certainty if there was any significant dilatation or change in caliber with this stricture throughout the exam. As noted in the findings, there is also a moderate persistent stricture in the region of the rectosigmoid junction which does not appear to cause significant obstruction. Electronically Signed   By: Marijo Conception M.D.   On: 05/24/2021 14:59    (Echo, Carotid, EGD, Colonoscopy, ERCP)    Subjective:  Is anxiously waiting to go home no complaints had a stent placed in the colon for stricture advised him to keep his bowels moving daily increased MiraLAX to twice a day Discharge Exam Vitals:   05/26/21 1039 05/26/21 1043  BP: (!) 202/82 (!) 185/86  Pulse: 62 64  Resp: 15 (!) 21  Temp:    SpO2: 100% 98%   Vitals:   05/26/21 0829 05/26/21 1030 05/26/21 1039 05/26/21 1043  BP: (!) 180/98 (!) 189/79 (!) 202/82 (!) 185/86  Pulse: 63 63 62 64  Resp: 15 16 15  (!) 21  Temp:      TempSrc: Oral     SpO2: 96% 100% 100% 98%  Weight: 74.4 kg     Height: 5\' 11"  (1.803 m)       General: Pt is alert, awake, not in  acute distress Cardiovascular: RRR, S1/S2 +, no rubs, no gallops Respiratory: CTA bilaterally, no wheezing, no rhonchi Abdominal: Soft, NT, ND, bowel sounds + Extremities: no edema, no cyanosis    The results of significant diagnostics from this hospitalization (including imaging, microbiology, ancillary and laboratory) are listed below for reference.     Microbiology: Recent Results (from the past 240 hour(s))  Resp Panel by RT-PCR (Flu A&B, Covid) Nasopharyngeal Swab     Status: None   Collection Time: 05/18/21  3:49 AM   Specimen: Nasopharyngeal Swab; Nasopharyngeal(NP) swabs in vial transport medium  Result Value Ref Range Status   SARS Coronavirus 2 by RT PCR NEGATIVE NEGATIVE Final    Comment:  (NOTE) SARS-CoV-2 target nucleic acids are NOT DETECTED.  The SARS-CoV-2 RNA is generally detectable in upper respiratory specimens during the acute phase of infection. The lowest concentration of SARS-CoV-2 viral copies this assay can detect is 138 copies/mL. A negative result does not preclude SARS-Cov-2 infection and should not be used as the sole basis for treatment or other patient management decisions. A negative result may occur with  improper specimen collection/handling, submission of specimen other than nasopharyngeal swab, presence of viral mutation(s) within the areas targeted by this assay, and inadequate number of viral copies(<138 copies/mL). A negative result must be combined with clinical observations, patient history, and epidemiological information. The expected result is Negative.  Fact Sheet for Patients:  EntrepreneurPulse.com.au  Fact Sheet for Healthcare Providers:  IncredibleEmployment.be  This test is no t yet approved or cleared by the [place] FDA and  has been authorized for detection and/or diagnosis of SARS-CoV-2 by FDA under an Emergency Use Authorization (EUA). This EUA will remain  in effect (meaning this test can be used) for the duration of the COVID-19 declaration under Section 564(b)(1) of the Act, 21 U.S.C.section 360bbb-3(b)(1), unless the authorization is terminated  or revoked sooner.       Influenza A by PCR NEGATIVE NEGATIVE Final   Influenza B by PCR NEGATIVE NEGATIVE Final    Comment: (NOTE) The Xpert Xpress SARS-CoV-2/FLU/RSV plus assay is intended as an aid in the diagnosis of influenza from Nasopharyngeal swab specimens and should not be used as a sole basis for treatment. Nasal washings and aspirates are unacceptable for Xpert Xpress SARS-CoV-2/FLU/RSV testing.  Fact Sheet for Patients: EntrepreneurPulse.com.au  Fact Sheet for Healthcare  Providers: IncredibleEmployment.be  This test is not yet approved or cleared by the [place] FDA and has been authorized for detection and/or diagnosis of SARS-CoV-2 by FDA under an Emergency Use Authorization (EUA). This EUA will remain in effect (meaning this test can be used) for the duration of the COVID-19 declaration under Section 564(b)(1) of the Act, 21 U.S.C. section 360bbb-3(b)(1), unless the authorization is terminated or revoked.  Performed at [facility], [address] [place], [place] [address]      Labs: BNP (last 3 results) No results for input(s): BNP in the last 8760 hours. Basic Metabolic Panel: Recent Labs  Lab 05/22/21 0444 05/23/21 0546 05/24/21 0502 05/25/21 1003 05/26/21 0433  NA 140 140 140 140 139  K 3.7 3.9 4.0 4.1 4.3  CL 109 109 109 109 108  CO2 25 27 26 25 26   GLUCOSE 119* 121* 118* 137* 107*  BUN 13 10 10 8  7*  CREATININE 1.42* 1.56* 1.63* 1.51* 1.66*  CALCIUM 8.0* 8.1* 8.2* 8.4* 8.7*  MG 1.8 1.7 1.8 1.9 1.9  PHOS 2.5 2.6 2.7 2.7 3.3   Liver  Function Tests: Recent Labs  Lab 05/22/21 0444 05/23/21 0546 05/24/21 0502 05/25/21 1003 05/26/21 0433  AST 22 27 27 29 30   ALT 18 23 23 25 26   ALKPHOS 44 39 40 50 48  BILITOT 0.2* 0.5 0.4 0.4 0.5  PROT 5.3* 5.4* 5.5* 6.0* 5.8*  ALBUMIN 2.7* 2.7* 2.7* 2.9* 2.8*   No results for input(s): LIPASE, AMYLASE in the last 168 hours. No results for input(s): AMMONIA in the last 168 hours. CBC: Recent Labs  Lab 05/22/21 0444 05/23/21 0546 05/24/21 0502 05/25/21 1003 05/26/21 0433  WBC 6.9 7.2 9.3 9.8 10.1  NEUTROABS 3.1 3.4 4.8 5.6 5.0  HGB 11.6* 11.3* 11.6* 12.5* 12.1*  HCT 35.6* 35.3* 36.2* 38.6* 37.0*  MCV 98.3 99.7 98.6 98.2 98.7  PLT 753* 764* 792* 789* 708*   Cardiac Enzymes: No results for input(s): CKTOTAL, CKMB, CKMBINDEX, TROPONINI in the last 168 hours. BNP: Invalid input(s): POCBNP CBG: No results for input(s): GLUCAP in the last 168  hours. D-Dimer No results for input(s): DDIMER in the last 72 hours. Hgb A1c No results for input(s): HGBA1C in the last 72 hours. Lipid Profile No results for input(s): CHOL, HDL, LDLCALC, TRIG, CHOLHDL, LDLDIRECT in the last 72 hours. Thyroid function studies No results for input(s): TSH, T4TOTAL, T3FREE, THYROIDAB in the last 72 hours.  Invalid input(s): FREET3 Anemia work up Recent Labs    05/26/21 0433  VITAMINB12 556  FOLATE 13.0  FERRITIN 88  TIBC 272  IRON 70  RETICCTPCT 1.9   Urinalysis    Component Value Date/Time   COLORURINE YELLOW 05/18/2021 1510   APPEARANCEUR HAZY (A) 05/18/2021 1510   LABSPEC 1.025 05/18/2021 1510   PHURINE 5.0 05/18/2021 1510   GLUCOSEU NEGATIVE 05/18/2021 1510   HGBUR NEGATIVE 05/18/2021 1510   BILIRUBINUR NEGATIVE 05/18/2021 1510   KETONESUR 5 (A) 05/18/2021 1510   PROTEINUR NEGATIVE 05/18/2021 1510   UROBILINOGEN 0.2 08/23/2015 1555   NITRITE NEGATIVE 05/18/2021 1510   LEUKOCYTESUR NEGATIVE 05/18/2021 1510   Sepsis Labs Invalid input(s): PROCALCITONIN,  WBC,  LACTICIDVEN Microbiology Recent Results (from the past 240 hour(s))  Resp Panel by RT-PCR (Flu A&B, Covid) Nasopharyngeal Swab     Status: None   Collection Time: 05/18/21  3:49 AM   Specimen: Nasopharyngeal Swab; Nasopharyngeal(NP) swabs in vial transport medium  Result Value Ref Range Status   SARS Coronavirus 2 by RT PCR NEGATIVE NEGATIVE Final    Comment: (NOTE) SARS-CoV-2 target nucleic acids are NOT DETECTED.  The SARS-CoV-2 RNA is generally detectable in upper respiratory specimens during the acute phase of infection. The lowest concentration of SARS-CoV-2 viral copies this assay can detect is 138 copies/mL. A negative result does not preclude SARS-Cov-2 infection and should not be used as the sole basis for treatment or other patient management decisions. A negative result may occur with  improper specimen collection/handling, submission of specimen other than  nasopharyngeal swab, presence of viral mutation(s) within the areas targeted by this assay, and inadequate number of viral copies(<138 copies/mL). A negative result must be combined with clinical observations, patient history, and epidemiological information. The expected result is Negative.  Fact Sheet for Patients:  EntrepreneurPulse.com.au  Fact Sheet for Healthcare Providers:  IncredibleEmployment.be  This test is no t yet approved or cleared by the [place] FDA and  has been authorized for detection and/or diagnosis of SARS-CoV-2 by FDA under an Emergency Use Authorization (EUA). This EUA will remain  in effect (meaning this test can be used) for the duration  of the COVID-19 declaration under Section 564(b)(1) of the Act, 21 U.S.C.section 360bbb-3(b)(1), unless the authorization is terminated  or revoked sooner.       Influenza A by PCR NEGATIVE NEGATIVE Final   Influenza B by PCR NEGATIVE NEGATIVE Final    Comment: (NOTE) The Xpert Xpress SARS-CoV-2/FLU/RSV plus assay is intended as an aid in the diagnosis of influenza from Nasopharyngeal swab specimens and should not be used as a sole basis for treatment. Nasal washings and aspirates are unacceptable for Xpert Xpress SARS-CoV-2/FLU/RSV testing.  Fact Sheet for Patients: EntrepreneurPulse.com.au  Fact Sheet for Healthcare Providers: IncredibleEmployment.be  This test is not yet approved or cleared by the [place] FDA and has been authorized for detection and/or diagnosis of SARS-CoV-2 by FDA under an Emergency Use Authorization (EUA). This EUA will remain in effect (meaning this test can be used) for the duration of the COVID-19 declaration under Section 564(b)(1) of the Act, 21 U.S.C. section 360bbb-3(b)(1), unless the authorization is terminated or revoked.  Performed at [facility], [address], [place], [place]  [address]      Time coordinating discharge: 37 minutes  SIGNED:   Georgette Shell, MD  [facility] 05/26/2021, 1:40 PM

## 2021-05-26 NOTE — Op Note (Signed)
[facility] Patient Name: Kevin Villegas Procedure Date: 05/26/2021 MRN: [id number] Attending MD: Clarene Essex , MD Date of Birth: [birth year] CSN: [id number] Age: 80 Admit Type: Outpatient Procedure:                Flexible Sigmoidoscopy Indications:              Abnormal CT of the GI tract, Concerns regarding a                            colonic stricture questionable metastatic versus                            adhesional Providers:                Clarene Essex, MD, Benay Pillow, RN, Elspeth Cho                            Tech., Technician, Caryl Pina CRNA Referring MD:              Medicines:                Propofol total dose 188 mg IV Complications:            No immediate complications. Estimated Blood Loss:     Estimated blood loss: none. Procedure:                Pre-Anesthesia Assessment:                           - Prior to the procedure, a History and Physical                            was performed, and patient medications and                            allergies were reviewed. The patient's tolerance of                            previous anesthesia was also reviewed. The risks                            and benefits of the procedure and the sedation                            options and risks were discussed with the patient.                            All questions were answered, and informed consent                            was obtained. Prior Anticoagulants: The patient has                            taken no previous anticoagulant or antiplatelet  agents. ASA Grade Assessment: II - A patient with                            mild systemic disease. After reviewing the risks                            and benefits, the patient was deemed in                            satisfactory condition to undergo the procedure.                           After obtaining informed consent, the scope was                            passed  under direct vision. The [id number]-[id number]                            ([id number]) Olympus ultra slim endoscope was                            introduced through the anus and advanced to the the                            descending colon. The flexible sigmoidoscopy was                            technically difficult and complex due to abnormal                            anatomy. Successful completion of the procedure was                            aided by performing the maneuvers documented                            (below) in this report. The quality of the bowel                            preparation was adequate. Scope In: Scope Out: Findings:      A few small-mouthed diverticula were found in the sigmoid colon and       descending colon.      A moderate amount of solid stool was found in the descending colon,       making visualization difficult.      A benign-appearing, intrinsic moderate stenosis measuring 5 cm (in       length) was found in the proximal sigmoid colon and was traversed. We       initially used the ultraslim endoscope some difficulty was able to cross       the stricture and we advanced our long JAG Wire and slowly withdrew the       scope measuring length of stricture and once the scope was removed we       exchanged over the wire the therapeutic upper  endoscope and advanced to       the stricture and then advanced in the stent through the scope and       through the stricture and this was stented with a 22 mm x 9 cm WallFlex       stent under fluoroscopic guidance. The stent was injected edition and       had opened "nicely and the introducer and wire were removed and the       scope was removed and the patient tolerated well no obvious immediate       complication      The exam was otherwise without abnormality. Impression:               - Diverticulosis in the sigmoid colon and in the                            descending colon.                           - Stool in  the descending colon.                           - Stricture in the proximal sigmoid colon.                            Prosthesis placed.                           - The examination was otherwise normal.                           - No specimens collected. Moderate Sedation:      Not Applicable - Patient had care per Anesthesia. Recommendation:           - Soft diet today. If doing well later today can go                            home                           - Continue present medications. Probably will need                            MiraLAX twice a day and drink plenty of liquids                           - Return to GI clinic PRN.                           - Telephone GI clinic if symptomatic PRN. Procedure Code(s):        --- Professional ---                           [phone], Sigmoidoscopy, flexible; with placement of                            endoscopic stent (includes pre- and post-dilation  and guide wire passage, when performed) Diagnosis Code(s):        --- Professional ---                           [phone], Other intestinal obstruction unspecified                            as to partial versus complete obstruction                           K57.30, Diverticulosis of large intestine without                            perforation or abscess without bleeding                           R93.3, Abnormal findings on diagnostic imaging of                            other parts of digestive tract CPT copyright 2019 [employer]. All rights reserved. The codes documented in this report are preliminary and upon coder review may  be revised to meet current compliance requirements. Clarene Essex, MD 05/26/2021 10:36:41 AM This report has been signed electronically. Number of Addenda: 0

## 2021-05-26 NOTE — Transfer of Care (Signed)
Immediate Anesthesia Transfer of Care Note  Patient: Kevin Villegas  Procedure(s) Performed: FLEXIBLE SIGMOIDOSCOPY (N/A ) COLONIC STENT PLACEMENT (N/A )  Patient Location: Endoscopy Unit  Anesthesia Type:MAC  Level of Consciousness: awake, alert , oriented and patient cooperative  Airway & Oxygen Therapy: Patient Spontanous Breathing and Patient connected to face mask oxygen  Post-op Assessment: Report given to RN, Post -op Vital signs reviewed and stable and Patient moving all extremities  Post vital signs: Reviewed and stable  Last Vitals:  Vitals Value Taken Time  BP    Temp    Pulse 63 05/26/21 1029  Resp 16 05/26/21 1029  SpO2 100 % 05/26/21 1029  Vitals shown include unvalidated device data.  Last Pain:  Vitals:   05/26/21 0829  TempSrc: Oral  PainSc: 0-No pain         Complications: No complications documented.

## 2021-05-26 NOTE — Anesthesia Postprocedure Evaluation (Signed)
Anesthesia Post Note  Patient: Kevin Villegas  Procedure(s) Performed: FLEXIBLE SIGMOIDOSCOPY (N/A ) COLONIC STENT PLACEMENT (N/A )     Patient location during evaluation: Endoscopy Anesthesia Type: MAC Level of consciousness: awake Pain management: pain level controlled Vital Signs Assessment: post-procedure vital signs reviewed and stable Respiratory status: spontaneous breathing, nonlabored ventilation, respiratory function stable and patient connected to nasal cannula oxygen Cardiovascular status: stable and blood pressure returned to baseline Postop Assessment: no apparent nausea or vomiting Anesthetic complications: no   No complications documented.  Last Vitals:  Vitals:   05/26/21 1039 05/26/21 1043  BP: (!) 202/82 (!) 185/86  Pulse: 62 64  Resp: 15 (!) 21  Temp:    SpO2: 100% 98%    Last Pain:  Vitals:   05/26/21 1043  TempSrc:   PainSc: 0-No pain                 Mahima Hottle P Arleatha Philipps

## 2021-05-26 NOTE — Progress Notes (Signed)
Patient doing well and understands.  Tolerating diet and okay with me to go home and follow-up with me as needed

## 2021-05-26 NOTE — Progress Notes (Signed)
Stent successfully placed today by GI.  No surgical needs for diversion at this point.  He may follow up with his primary surgeons as needed.  We will sign off at this time.  Henreitta Cea 12:04 PM 05/26/2021

## 2021-05-26 NOTE — Progress Notes (Signed)
Kevin Villegas is doing okay this morning.  He feels well.  He has had no abdominal pain.  He goes for the colonic stent this morning.  His labs look fine.  His CBC shows white cell count 10.1.  Hemoglobin 12.1.  Platelet count 708,000.  His iron studies looked okay.  Iron saturation was 26%.  His vitamin B12 was 556.  His BUN is 7 creatinine 1.66.  There is no fever.  He has had no bleeding.  He has had no diarrhea.  His vital signs are temperature 97.9.  Pulse 58.  Blood pressure 163/86.  Lungs are clear bilaterally.  Cardiac exam regular rate and rhythm with no murmurs.  Abdomen is soft.  There is no distention.  Bowel sounds are present.  He has no guarding or rebound tenderness.  There is no palpable liver or spleen tip.  Extremity shows no clubbing, cyanosis or edema.  Kevin Villegas has this colonic stricture.  I would like to hope that this is not from malignancy pressing on the colon.  He has diverticulosis.  He will have the stent placed today.  Hopefully, he will be able to be discharged home afterwards.  We will start him his chemotherapy in the office next week.  Again, the staff on [address] have done a tremendous job in taking care of him.  I appreciate their compassion.  Lattie Haw, MD  Psalm 116:2

## 2021-05-26 NOTE — Anesthesia Procedure Notes (Deleted)
Spinal  Patient location during procedure: OR Start time: 05/26/2021 8:45 AM End time: 05/26/2021 8:55 AM Reason for block: surgical anesthesia Staffing Performed: anesthesiologist  Anesthesiologist: Calisa Luckenbaugh P, MD Preanesthetic Checklist Completed: patient identified, IV checked, risks and benefits discussed, surgical consent, monitors and equipment checked, pre-op evaluation and timeout performed Spinal Block Patient position: sitting Prep: DuraPrep Patient monitoring: cardiac monitor, continuous pulse ox and blood pressure Approach: left paramedian Location: L4-5 Injection technique: single-shot Needle Needle type: Pencan  Needle gauge: 24 G Needle length: 9 cm Assessment Sensory level: T10 Events: CSF return Additional Notes Functioning IV was confirmed and monitors were applied. Sterile prep and drape, including hand hygiene and sterile gloves were used. The patient was positioned and the spine was prepped. The skin was anesthetized with lidocaine.  Free flow of clear CSF was obtained prior to injecting local anesthetic into the CSF.  The spinal needle aspirated freely following injection.  The needle was carefully withdrawn.  The patient tolerated the procedure well.      

## 2021-05-26 NOTE — Progress Notes (Signed)
Kevin Villegas 9:31 AM  Subjective: Patient doing well without any new complaints and we rediscussed the procedure  Objective: Vital signs stable afebrile no  acute distress exam please see preassessment evaluation labs stable  Assessment: Colonic stricture  Plan: Okay to proceed with stent attempt with anesthesia assistance  [facility]  office [phone] After 5PM or if no answer call [phone]

## 2021-05-26 NOTE — Anesthesia Preprocedure Evaluation (Addendum)
Anesthesia Evaluation  Patient identified by MRN, date of birth, ID band Patient awake    Reviewed: Allergy & Precautions, NPO status , Patient's Chart, lab work & pertinent test results  Airway Mallampati: II  TM Distance: >3 FB Neck ROM: Full    Dental no notable dental hx.    Pulmonary neg pulmonary ROS,    Pulmonary exam normal breath sounds clear to auscultation       Cardiovascular negative cardio ROS Normal cardiovascular exam Rhythm:Regular Rate:Normal  ECG: SR, rate 73   Neuro/Psych negative neurological ROS  negative psych ROS   GI/Hepatic negative GI ROS, Neg liver ROS,   Endo/Other  Pancreatic cancer   Renal/GU negative Renal ROS     Musculoskeletal negative musculoskeletal ROS (+)   Abdominal   Peds  Hematology  (+) anemia ,   Anesthesia Other Findings COLON OBSTRUCTION  Reproductive/Obstetrics                           Anesthesia Physical Anesthesia Plan  ASA: II  Anesthesia Plan: MAC   Post-op Pain Management:    Induction: Intravenous  PONV Risk Score and Plan: 1 and Propofol infusion and Treatment may vary due to age or medical condition  Airway Management Planned: Nasal Cannula  Additional Equipment:   Intra-op Plan:   Post-operative Plan:   Informed Consent: I have reviewed the patients History and Physical, chart, labs and discussed the procedure including the risks, benefits and alternatives for the proposed anesthesia with the patient or authorized representative who has indicated his/her understanding and acceptance.     Dental advisory given  Plan Discussed with: CRNA  Anesthesia Plan Comments:        Anesthesia Quick Evaluation

## 2021-05-28 ENCOUNTER — Encounter (HOSPITAL_COMMUNITY): Payer: Self-pay | Admitting: Gastroenterology

## 2021-05-28 ENCOUNTER — Telehealth: Payer: Self-pay

## 2021-05-28 NOTE — Telephone Encounter (Signed)
Transition Care Management Follow-up Telephone Call Date of discharge and from where: [name] 05/26/21 Diagnosis: colon obstruction How have you been since you were released from the hospital? Much better Any questions or concerns? No  Items Reviewed: Did the pt receive and understand the discharge instructions provided? Yes  Medications obtained and verified? Yes  Other? No  Any new allergies since your discharge? No  Dietary orders reviewed? Yes Do you have support at home? Yes   Home Care and Equipment/Supplies: Were home health services ordered? no  Were any new equipment or medical supplies ordered?  No  Functional Questionnaire: (I = Independent and D = Dependent) ADLs: I  Bathing/Dressing- I  Meal Prep- I  Eating- I  Maintaining continence- I  Transferring/Ambulation- I  Managing Meds- I  Follow up appointments reviewed:  PCP Hospital f/u appt confirmed? Yes  Scheduled to see Dettinger on 06/02/21 @ 1:40. [facility] f/u appt confirmed? No  but he will make appt with Dr Marin Olp, Oncologist Are transportation arrangements needed? No  If their condition worsens, is the pt aware to call PCP or go to the Emergency Dept.? Yes Was the patient provided with contact information for the PCP's office or ED? Yes Was to pt encouraged to call back with questions or concerns? Yes

## 2021-05-29 ENCOUNTER — Encounter: Payer: Self-pay | Admitting: Family Medicine

## 2021-05-31 ENCOUNTER — Telehealth: Payer: Self-pay | Admitting: *Deleted

## 2021-05-31 NOTE — Telephone Encounter (Signed)
Pt called that office and asked to talk to DR [place] if possible. Please call back [phone]. Pt is aware he has an apt Wednesday with Dettinger

## 2021-05-31 NOTE — Telephone Encounter (Signed)
MyChart message from patient requesting to speak with Dr. Marin Olp.  Call placed to patient per desk nurse.  Pt states that he has not had a BM since Wednesday, 05/26/21 when stent was placed and is concerned about blockage. He denies any pain, nausea or vomiting. He states that he has spoken with Dr. Watt Climes since sending his message to speak with Dr. Marin Olp and Dr. Watt Climes has ordered for him to drink a bottle of Mag Citrate and to contact his office by Weds, if he has had no BM. Pt is appreciative of call and has no further questions at this time.

## 2021-06-01 ENCOUNTER — Inpatient Hospital Stay (HOSPITAL_COMMUNITY)
Admission: EM | Admit: 2021-06-01 | Discharge: 2021-06-05 | DRG: 919 | Disposition: A | Discharge status: Home / Self Care | Payer: Medicare Other | Attending: Student | Admitting: Student

## 2021-06-01 ENCOUNTER — Emergency Department (HOSPITAL_COMMUNITY): Payer: Medicare Other

## 2021-06-01 ENCOUNTER — Encounter (HOSPITAL_COMMUNITY): Payer: Self-pay | Admitting: *Deleted

## 2021-06-01 DIAGNOSIS — E43 Unspecified severe protein-calorie malnutrition: Secondary | ICD-10-CM | POA: Insufficient documentation

## 2021-06-01 DIAGNOSIS — N1831 Chronic kidney disease, stage 3a: Secondary | ICD-10-CM | POA: Diagnosis present

## 2021-06-01 DIAGNOSIS — D72829 Elevated white blood cell count, unspecified: Secondary | ICD-10-CM | POA: Diagnosis present

## 2021-06-01 DIAGNOSIS — Z90411 Acquired partial absence of pancreas: Secondary | ICD-10-CM

## 2021-06-01 DIAGNOSIS — Z9049 Acquired absence of other specified parts of digestive tract: Secondary | ICD-10-CM

## 2021-06-01 DIAGNOSIS — T85898A Other specified complication of other internal prosthetic devices, implants and grafts, initial encounter: Secondary | ICD-10-CM | POA: Diagnosis not present

## 2021-06-01 DIAGNOSIS — N19 Unspecified kidney failure: Secondary | ICD-10-CM | POA: Diagnosis not present

## 2021-06-01 DIAGNOSIS — R109 Unspecified abdominal pain: Secondary | ICD-10-CM

## 2021-06-01 DIAGNOSIS — K5669 Other partial intestinal obstruction: Secondary | ICD-10-CM | POA: Diagnosis present

## 2021-06-01 DIAGNOSIS — K56609 Unspecified intestinal obstruction, unspecified as to partial versus complete obstruction: Secondary | ICD-10-CM | POA: Diagnosis present

## 2021-06-01 DIAGNOSIS — R7989 Other specified abnormal findings of blood chemistry: Secondary | ICD-10-CM | POA: Diagnosis present

## 2021-06-01 DIAGNOSIS — N179 Acute kidney failure, unspecified: Secondary | ICD-10-CM | POA: Diagnosis present

## 2021-06-01 DIAGNOSIS — E860 Dehydration: Secondary | ICD-10-CM | POA: Diagnosis present

## 2021-06-01 DIAGNOSIS — Z8507 Personal history of malignant neoplasm of pancreas: Secondary | ICD-10-CM

## 2021-06-01 DIAGNOSIS — Z823 Family history of stroke: Secondary | ICD-10-CM

## 2021-06-01 DIAGNOSIS — Z6820 Body mass index (BMI) 20.0-20.9, adult: Secondary | ICD-10-CM

## 2021-06-01 DIAGNOSIS — Z903 Acquired absence of stomach [part of]: Secondary | ICD-10-CM

## 2021-06-01 DIAGNOSIS — Z20822 Contact with and (suspected) exposure to covid-19: Secondary | ICD-10-CM | POA: Diagnosis present

## 2021-06-01 DIAGNOSIS — N132 Hydronephrosis with renal and ureteral calculous obstruction: Secondary | ICD-10-CM | POA: Diagnosis present

## 2021-06-01 DIAGNOSIS — N201 Calculus of ureter: Secondary | ICD-10-CM

## 2021-06-01 DIAGNOSIS — C7800 Secondary malignant neoplasm of unspecified lung: Secondary | ICD-10-CM | POA: Diagnosis present

## 2021-06-01 DIAGNOSIS — Z79899 Other long term (current) drug therapy: Secondary | ICD-10-CM

## 2021-06-01 DIAGNOSIS — Y738 Miscellaneous gastroenterology and urology devices associated with adverse incidents, not elsewhere classified: Secondary | ICD-10-CM | POA: Diagnosis present

## 2021-06-01 DIAGNOSIS — Z9081 Acquired absence of spleen: Secondary | ICD-10-CM

## 2021-06-01 DIAGNOSIS — C786 Secondary malignant neoplasm of retroperitoneum and peritoneum: Secondary | ICD-10-CM | POA: Diagnosis present

## 2021-06-01 HISTORY — DX: Unspecified intestinal obstruction, unspecified as to partial versus complete obstruction: K56.609

## 2021-06-01 LAB — COMPREHENSIVE METABOLIC PANEL
ALT: 42 U/L (ref 0–44)
AST: 44 U/L — ABNORMAL HIGH (ref 15–41)
Albumin: 3.8 g/dL (ref 3.5–5.0)
Alkaline Phosphatase: 68 U/L (ref 38–126)
Anion gap: 13 (ref 5–15)
BUN: 38 mg/dL — ABNORMAL HIGH (ref 8–23)
CO2: 22 mmol/L (ref 22–32)
Calcium: 9.3 mg/dL (ref 8.9–10.3)
Chloride: 100 mmol/L (ref 98–111)
Creatinine, Ser: 1.84 mg/dL — ABNORMAL HIGH (ref 0.61–1.24)
GFR, Estimated: 37 mL/min — ABNORMAL LOW (ref >60)
Glucose, Bld: 106 mg/dL — ABNORMAL HIGH (ref 70–99)
Potassium: 4.9 mmol/L (ref 3.5–5.1)
Sodium: 135 mmol/L (ref 135–145)
Total Bilirubin: 1.1 mg/dL (ref 0.3–1.2)
Total Protein: 7.9 g/dL (ref 6.5–8.1)

## 2021-06-01 LAB — URINALYSIS, ROUTINE W REFLEX MICROSCOPIC
Bilirubin Urine: NEGATIVE
Glucose, UA: NEGATIVE mg/dL
Hgb urine dipstick: NEGATIVE
Ketones, ur: 20 mg/dL — AB
Nitrite: NEGATIVE
Protein, ur: NEGATIVE mg/dL
Specific Gravity, Urine: 1.023 (ref 1.005–1.030)
pH: 5 (ref 5.0–8.0)

## 2021-06-01 LAB — RESP PANEL BY RT-PCR (FLU A&B, COVID) ARPGX2
Influenza A by PCR: NEGATIVE
Influenza B by PCR: NEGATIVE
SARS Coronavirus 2 by RT PCR: NEGATIVE

## 2021-06-01 LAB — LIPASE, BLOOD: Lipase: 27 U/L (ref 11–51)

## 2021-06-01 LAB — MAGNESIUM: Magnesium: 2.6 mg/dL — ABNORMAL HIGH (ref 1.7–2.4)

## 2021-06-01 LAB — CBC
HCT: 44.9 % (ref 39.0–52.0)
Hemoglobin: 14.9 g/dL (ref 13.0–17.0)
MCH: 32.3 pg (ref 26.0–34.0)
MCHC: 33.2 g/dL (ref 30.0–36.0)
MCV: 97.2 fL (ref 80.0–100.0)
Platelets: 498 10*3/uL — ABNORMAL HIGH (ref 150–400)
RBC: 4.62 MIL/uL (ref 4.22–5.81)
RDW: 14.5 % (ref 11.5–15.5)
WBC: 15.6 10*3/uL — ABNORMAL HIGH (ref 4.0–10.5)
nRBC: 0 % (ref 0.0–0.2)

## 2021-06-01 MED ORDER — ACETAMINOPHEN 325 MG PO TABS
650.0000 mg | ORAL_TABLET | Freq: Four times a day (QID) | ORAL | Status: DC | PRN
  Administered 2021-06-03 – 2021-06-04 (×2): 650 mg via ORAL
  Filled 2021-06-01 (×2): qty 2

## 2021-06-01 MED ORDER — LACTATED RINGERS IV SOLN: INTRAVENOUS | Status: DC

## 2021-06-01 MED ORDER — IOHEXOL 9 MG/ML PO SOLN
ORAL | Status: AC
  Administered 2021-06-01: 500 mL via ORAL
  Filled 2021-06-01: qty 1000

## 2021-06-01 MED ORDER — ACETAMINOPHEN 650 MG RE SUPP: 650.0000 mg | Freq: Four times a day (QID) | RECTAL | Status: DC | PRN

## 2021-06-01 MED ORDER — ONDANSETRON HCL 4 MG/2ML IJ SOLN
4.0000 mg | Freq: Four times a day (QID) | INTRAMUSCULAR | Status: DC | PRN
  Administered 2021-06-02: 4 mg via INTRAVENOUS
  Filled 2021-06-01: qty 2

## 2021-06-01 MED ORDER — LIDOCAINE-PRILOCAINE 2.5-2.5 % EX CREA
TOPICAL_CREAM | Freq: Once | CUTANEOUS | Status: AC
  Filled 2021-06-01: qty 5

## 2021-06-01 MED ORDER — IOHEXOL 9 MG/ML PO SOLN
500.0000 mL | ORAL | Status: AC
  Administered 2021-06-01: 1000 mL via ORAL

## 2021-06-01 MED ORDER — FENTANYL CITRATE (PF) 100 MCG/2ML IJ SOLN
25.0000 ug | INTRAMUSCULAR | Status: DC | PRN
Start: 2021-06-01 — End: 2021-06-05

## 2021-06-01 NOTE — Plan of Care (Signed)
S-BAR hand off reviewed. General Care Plan initiated.

## 2021-06-01 NOTE — Consult Note (Signed)
[facility] Gastroenterology Consult  Referring Provider: ER Primary Care Physician:  Dettinger, [name], MD Primary Gastroenterologist: Dr.Magod  Reason for Consultation: No bowel movement for 6 days since 05/26/2021  HPI: Kevin Villegas is a 80 y.o. male recently underwent a flexible sigmoidoscopy and colonic stent placement 6 days ago. Prior to the procedure he had a bowel movement with Gastrografin enema, however since then has not had a bowel movement. He was advised to take magnesium citrate and continue taking MiraLAX.  Yesterday after taking magnesium citrate he developed worsening abdominal pain but did not have a bowel movement and was advised to get an abdominal x-ray.  Patient states that for the last 2 days he has not eaten anything and has drank only water. He complains of nausea but no vomiting.  His past medical history includes metastatic pancreatic cancer with pulmonary and mesenteric lymph nodes, he has a duodenal stent in place.   Past Medical History:  Diagnosis Date   Pancreatic cancer [facility])     Past Surgical History:  Procedure Laterality Date   ABDOMINAL SURGERY     BIOPSY  05/20/2021   Procedure: BIOPSY;  Surgeon: Otis Brace, MD;  Location: [facility] ENDOSCOPY;  Service: Gastroenterology;;   CHOLECYSTECTOMY N/A 12/16/2014   Procedure: LAPAROSCOPIC CHOLECYSTECTOMY WITH INTRAOPERATIVE CHOLANGIOGRAM;  Surgeon: Georganna Skeans, MD;  Location: [place];  Service: General;  Laterality: N/A;   COLONIC STENT PLACEMENT N/A 05/26/2021   Procedure: COLONIC STENT PLACEMENT;  Surgeon: Clarene Essex, MD;  Location: [facility] ENDOSCOPY;  Service: Endoscopy;  Laterality: N/A;   ESOPHAGOGASTRODUODENOSCOPY N/A 05/20/2021   Procedure: ESOPHAGOGASTRODUODENOSCOPY (EGD);  Surgeon: Otis Brace, MD;  Location: [facility];  Service: Gastroenterology;  Laterality: N/A;   EUS N/A 09/24/2015   Procedure: UPPER ENDOSCOPIC ULTRASOUND (EUS) LINEAR;  Surgeon: Milus Banister, MD;  Location: [facility] ENDOSCOPY;   Service: Endoscopy;  Laterality: N/A;   FLEXIBLE SIGMOIDOSCOPY N/A 05/20/2021   Procedure: FLEXIBLE SIGMOIDOSCOPY;  Surgeon: Otis Brace, MD;  Location: [facility] ENDOSCOPY;  Service: Gastroenterology;  Laterality: N/A;   FLEXIBLE SIGMOIDOSCOPY N/A 05/26/2021   Procedure: FLEXIBLE SIGMOIDOSCOPY;  Surgeon: Clarene Essex, MD;  Location: [facility] ENDOSCOPY;  Service: Endoscopy;  Laterality: N/A;   VASECTOMY      Prior to Admission medications   Medication Sig Start Date End Date Taking? Authorizing Provider  dicyclomine (BENTYL) 20 MG tablet Take 0.5 tablets (10 mg total) by mouth every 6 (six) hours as needed (For abdominal cramping). 05/16/21   Molpus, John, MD  lidocaine-prilocaine (EMLA) cream Apply 1 application topically as needed. Port access 05/04/21   [provider]  Multiple Vitamin (MULTIVITAMIN WITH MINERALS) TABS tablet Take 1 tablet by mouth daily. 05/26/21   Georgette Shell, MD  ondansetron (ZOFRAN) 8 MG tablet Take 8 mg by mouth every 8 (eight) hours as needed for nausea or vomiting. 05/04/21   [provider]  polyethylene glycol (MIRALAX) 17 g packet Take 17 g by mouth 2 (two) times daily. 05/26/21   Georgette Shell, MD  prochlorperazine (COMPAZINE) 5 MG tablet Take 5 mg by mouth every 6 (six) hours as needed for nausea or vomiting. 05/04/21   [provider]    No current facility-administered medications for this encounter.   Current Outpatient Medications  Medication Sig Dispense Refill   dicyclomine (BENTYL) 20 MG tablet Take 0.5 tablets (10 mg total) by mouth every 6 (six) hours as needed (For abdominal cramping). 20 tablet 0   lidocaine-prilocaine (EMLA) cream Apply 1 application topically as needed. Port access  Multiple Vitamin (MULTIVITAMIN WITH MINERALS) TABS tablet Take 1 tablet by mouth daily.     ondansetron (ZOFRAN) 8 MG tablet Take 8 mg by mouth every 8 (eight) hours as needed for nausea or vomiting.     polyethylene glycol (MIRALAX) 17 g  packet Take 17 g by mouth 2 (two) times daily.  0   prochlorperazine (COMPAZINE) 5 MG tablet Take 5 mg by mouth every 6 (six) hours as needed for nausea or vomiting.      Allergies as of 06/01/2021 - Review Complete 06/01/2021  Allergen Reaction Noted   Pollen extract Other (See Comments) 09/19/2017    Family History  Problem Relation Age of Onset   Stroke Mother     Social History   Socioeconomic History   Marital status: Married    Spouse name: Not on file   Number of children: 2   Years of education: Not on file   Highest education level: Not on file  Occupational History   Occupation: retired  Tobacco Use   Smoking status: Never   Smokeless tobacco: Never  Vaping Use   Vaping Use: Never used  Substance and Sexual Activity   Alcohol use: No    Alcohol/week: 0.0 standard drinks   Drug use: No   Sexual activity: Yes  Other Topics Concern   Not on file  Social History Narrative   Not on file   Social Determinants of Health   Financial Resource Strain: Not on file  Food Insecurity: Not on file  Transportation Needs: Not on file  Physical Activity: Not on file  Stress: Not on file  Social Connections: Not on file  Intimate Partner Violence: Not on file    Review of Systems:  GI: Described in detail in HPI.    Gen: Denies any fever, chills, rigors, night sweats, anorexia, fatigue, weakness, malaise, involuntary weight loss, and sleep disorder CV: Denies chest pain, angina, palpitations, syncope, orthopnea, PND, peripheral edema, and claudication. Resp: Denies dyspnea, cough, sputum, wheezing, coughing up blood. GU : Denies urinary burning, blood in urine, urinary frequency, urinary hesitancy, nocturnal urination, and urinary incontinence. MS: Denies joint pain or swelling.  Denies muscle weakness, cramps, atrophy.  Derm: Denies rash, itching, oral ulcerations, hives, unhealing ulcers.  Psych: Denies depression, anxiety, memory loss, suicidal ideation,  hallucinations,  and confusion. Heme: Denies bruising, bleeding, and enlarged lymph nodes. Neuro:  Denies any headaches, dizziness, paresthesias. Endo:  Denies any problems with DM, thyroid, adrenal function.  Physical Exam: Vital signs in last 24 hours: Temp:  [97.9 F (36.6 C)] 97.9 F (36.6 C) (06/14 1538) Pulse Rate:  [93-94] 94 (06/14 1538) Resp:  [16-17] 16 (06/14 1538) BP: (128-143)/(91-106) 143/106 (06/14 1538) SpO2:  [94 %-95 %] 95 % (06/14 1538)    General:   Alert,  Well-developed, well-nourished, pleasant and cooperative in NAD Head:  Normocephalic and atraumatic. Eyes:  Sclera clear, no icterus.   Conjunctiva pink. Ears:  Normal auditory acuity. Nose:  No deformity, discharge,  or lesions. Mouth:  No deformity or lesions.  Oropharynx pink & moist. Neck:  Supple; no masses or thyromegaly. Lungs:  Clear throughout to auscultation.   No wheezes, crackles, or rhonchi. No acute distress. Heart:  Regular rate and rhythm; no murmurs, clicks, rubs,  or gallops. Extremities:  Without clubbing or edema. Neurologic:  Alert and  oriented x4;  grossly normal neurologically. Skin:  Intact without significant lesions or rashes. Psych:  Alert and cooperative. Normal mood and affect. Abdomen: Soft, nondistended, normoactive  bowel sounds     Lab Results: Recent Labs    06/01/21 1408  WBC 15.6*  HGB 14.9  HCT 44.9  PLT 498*   BMET Recent Labs    06/01/21 1408  NA 135  K 4.9  CL 100  CO2 22  GLUCOSE 106*  BUN 38*  CREATININE 1.84*  CALCIUM 9.3   LFT Recent Labs    06/01/21 1408  PROT 7.9  ALBUMIN 3.8  AST 44*  ALT 42  ALKPHOS 68  BILITOT 1.1   PT/INR No results for input(s): LABPROT, INR in the last 72 hours.  Studies/Results: No results found.  Impression: Colonic stricture, status post colonic stent placement on 05/26/2021 Unclear if patient has underlying colonic neoplasm versus stricture from extrinsic compression/diverticular  disease Gastrografin enema on 05/24/2021 showed extensive diverticulosis, moderate to severe stricture involving proximal sigmoid colon  Plan: Discussed with ER physician Dr. Zenia Resides. Recommend getting a CAT scan of the abdomen and pelvis with oral contrast. If colonic stent has migrated or shows obstruction, may need flexible sigmoidoscopy, with possible placement of another stent. If stent appears in good position, and patient has had no bowel movement due to underlying constipation, we may attempt giving him colonic prep to "flush his colon".    LOS: 0 days   Ronnette Juniper, MD  06/01/2021, 5:10 PM

## 2021-06-01 NOTE — H&P (Signed)
History and Physical    PLEASE NOTE THAT DRAGON DICTATION SOFTWARE WAS USED IN THE CONSTRUCTION OF THIS NOTE.   Kevin Villegas GLO:[id number] DOB: 06/18/1941 DOA: 06/01/2021  PCP: Villegas, Kevin Kaufmann, MD Patient coming from: home   I have personally briefly reviewed patient's old medical records in [place]  Chief Complaint: Constipation  HPI: Kevin Villegas is a 80 y.o. male with medical history significant for pancreatic cancer, large bowel obstruction, colonic stricture status post recent placement of sigmoid colonic stent who is admitted to [facility] on 06/01/2021 with large bowel obstruction in the setting of obstructed colonic stent after presenting from home to [facility] ED complaining of constipation.   In the setting of a history of large bowel obstruction and colonic stricture, the patient underwent sigmoid colon stent placement as an outpatient via Dr Watt Climes on 05/26/2021.  Following this colonic stent placement, the patient reports that he has experienced no bowel movement, and now no recent passage and flatus.  In the setting of noting no interval bowel movement, the patient reports that he independently stopped consuming food and fluids 2 days ago so as to reduce proximal pressure on a potential occluded colonic stent.  Yesterday, he contacted his outpatient gastroenterologist with the above information, at which time he was instructed to consume a bottle of magnesium citrate.  I am compliantly taking the magnesium citrate, the patient reports that he developed several hours diffuse crampy abdominal discomfort, in the absence of any ensuing bowel movement.  The crampy abdominal discomfort subsequently resolved spontaneously, and the patient reports that he has subsequently been free of any abdominal discomfort.  He denies any associated nausea, denies any recent vomiting.  Denies any recent subjective fever, chills, rigors, or generalized myalgias.  Denies any dysuria, gross  hematuria, or change in urinary urgency/frequency.  Of note, per chart review, CT abdomen/pelvis performed on 05/16/2021 demonstrated evidence of an 8 mm distal right ureteral calculus with associated moderate right hydronephrosis.  At the time of my encounter with the patient, he denies any right flank discomfort.  Denies any recent neck stiffness, rhinitis, rhinorrhea, sore throat, wheezing, cough or rash.  No recent traveling or known COVID-19 exposures.  Denies any recent chest pain, shortness of breath , diaphoresis, or palpitations.    Today, the patient once again contacted his outpatient gastroenterologist conveying interval updates regarding trying the magnesium citrate.  In response, gastroenterologist requested that the patient presented to the local emergency department for evaluation of potential colonic obstruction.     ED Course:  Vital signs in the ED were notable for the following:  - Temp max 98.0, heart rate 73-94; blood pressure 128/91 138/91; respiratory rate 16-18, oxygen saturation 94 to 99% on room air.  Labs were notable for the following: CMP was notable for the following: Sodium 135, potassium 4.9, bicarbonate 22, anion gap 13, BUN 38, creatinine 1.84 relative to most recent prior value of 1.66 on 05/26/2021, glucose 106, BUN/creatinine ratio 20.7, liver enzymes were found to be within normal limits.  Lipase 27.  CBC notable for white blood cell count of 15,600.  Urinalysis showed no white blood cells, nitrate negative, was associate with specific gravity of 1.023.  Screening nasopharyngeal COVID-19/influenza PCR performed in the emergency department this evening and found to be negative.  CT abdomen/pelvis, in comparison to CT abdomen/pelvis performed on 05/16/2021 showed diffuse colonic and small bowel dilation proximal to the sigmoid colonic stent, which appears to be obstructed, also showing unchanged appearance of  8 mm distal right ureteral calculus with moderate right  hydronephrosis.  The EDP discussed the patient's case and updated abdominal imaging with the on-call [facility] gastroenterologist, Dr. Kariki, who requested that the patient be admitted to the hospital service, with plan for formal gastroenterology consultation in the morning, at which time colonoscopy is anticipated.   [name], the patient was admitted for overnight observation to the MedSurg floor for further evaluation and management of presenting large bowel obstruction in the setting of suspected obstructive sigmoid colonic stent.   Review of Systems: As per HPI otherwise 10 point review of systems negative.   Past Medical History:  Diagnosis Date   Pancreatic cancer (HCC)     Past Surgical History:  Procedure Laterality Date   ABDOMINAL SURGERY     BIOPSY  05/20/2021   Procedure: BIOPSY;  Surgeon: Brahmbhatt, Parag, MD;  Location: [facility] ENDOSCOPY;  Service: Gastroenterology;;   CHOLECYSTECTOMY N/A 12/16/2014   Procedure: LAPAROSCOPIC CHOLECYSTECTOMY WITH INTRAOPERATIVE CHOLANGIOGRAM;  Surgeon: Burke Thompson, MD;  Location: [facility] OR;  Service: General;  Laterality: N/A;   COLONIC STENT PLACEMENT N/A 05/26/2021   Procedure: COLONIC STENT PLACEMENT;  Surgeon: Magod, Marc, MD;  Location: [facility] ENDOSCOPY;  Service: Endoscopy;  Laterality: N/A;   ESOPHAGOGASTRODUODENOSCOPY N/A 05/20/2021   Procedure: ESOPHAGOGASTRODUODENOSCOPY (EGD);  Surgeon: Brahmbhatt, Parag, MD;  Location: [facility] END[facility];  Service: Gastroenterology;  Laterality: N/A;   EUS N/A 09/24/2015   Procedure: UPPER ENDOSCOPIC ULTRASOUND (EUS) LINEAR;  Surgeon: Daniel P Jacobs, MD;  Location: [facility] ENDOSCOPY;  Service: Endoscopy;  Laterality: N/A;   FLEXIBLE SIGMOIDOSCOPY N/A 05/20/2021   Procedure: FLEXIBLE SIGMOIDOSCOPY;  Surgeon: Brahmbhatt, Parag, MD;  Location: [facility];  Service: Gastroenterology;  Laterality: N/A;   FLEXIBLE SIGMOIDOSCOPY N/A 05/26/2021   Procedure: FLEXIBLE SIGMOIDOSCOPY;  Surgeon: Magod, Marc, MD;  Location: [facility] ENDOS[facility];   Service: Endoscopy;  Laterality: N/A;   VASECTOMY      Social History:  reports that he has never smoked. He has never used smokeless tobacco. He reports that he does not drink alcohol and does not use drugs.   Allergies  Allergen Reactions   Pollen Extract Other (See Comments)    Sinus congestion    Family History  Problem Relation Age of Onset   Stroke Mother     Family history reviewed and not pertinent    Prior to Admission medications   Medication Sig Start Date End Date Taking? Authorizing Provider  dicyclomine (BENTYL) 20 MG tablet Take 0.5 tablets (10 mg total) by mouth every 6 (six) hours as needed (For abdominal cramping). 05/16/21   Molpus, John, MD  lidocaine-prilocaine (EMLA) cream Apply 1 application topically as needed. Port access 05/04/21   [provider]  Multiple Vitamin (MULTIVITAMIN WITH MINERALS) TABS tablet Take 1 tablet by mouth daily. 05/26/21   Mathews, Elizabeth G, MD  ondansetron (ZOFRAN) 8 MG tablet Take 8 mg by mouth every 8 (eight) hours as needed for nausea or vomiting. 05/04/21   [provider]  polyethylene glycol (MIRALAX) 17 g packet Take 17 g by mouth 2 (two) times daily. 05/26/21   Mathews, Elizabeth G, MD  prochlorperazine (COMPAZINE) 5 MG tablet Take 5 mg by mouth every 6 (six) hours as needed for nausea or vomiting. 05/04/21   [provider]     Objective    Physical Exam: Vitals:   06/01/21 1807 06/01/21 1830 06/01/21 1915 06/01/21 2015  BP: (!) 176/100 (!) 164/102 (!) 153/98 (!) 140/99  Pulse: 72 67 72 79  Resp: 18 18   17 18  Temp:      TempSrc:      SpO2: 98% 99% 95% 96%    General: appears to be stated age; alert, oriented Skin: warm, dry, no rash Head:  AT/ Mouth:  Oral mucosa membranes appear dry, normal dentition Neck: supple; trachea midline Heart:  RRR; did not appreciate any M/R/G Lungs: CTAB, did not appreciate any wheezes, rales, or rhonchi Abdomen: ; soft, ND, NT; hypoactive bowel sounds  noted Vascular: 2+ pedal pulses b/l; 2+ radial pulses b/l Extremities: no peripheral edema, no muscle wasting Neuro: strength and sensation intact in upper and lower extremities b/l     Labs on Admission: I have personally reviewed following labs and imaging studies  CBC: Recent Labs  Lab 05/26/21 0433 06/01/21 1408  WBC 10.1 15.6*  NEUTROABS 5.0  --   HGB 12.1* 14.9  HCT 37.0* 44.9  MCV 98.7 97.2  PLT 708* 498*   Basic Metabolic Panel: Recent Labs  Lab 05/26/21 0433 06/01/21 1408  NA 139 135  K 4.3 4.9  CL 108 100  CO2 26 22  GLUCOSE 107* 106*  BUN 7* 38*  CREATININE 1.66* 1.84*  CALCIUM 8.7* 9.3  MG 1.9  --   PHOS 3.3  --    GFR: Estimated Creatinine Clearance: 34.3 mL/min (A) (by C-G formula based on SCr of 1.84 mg/dL (H)). Liver Function Tests: Recent Labs  Lab 05/26/21 0433 06/01/21 1408  AST 30 44*  ALT 26 42  ALKPHOS 48 68  BILITOT 0.5 1.1  PROT 5.8* 7.9  ALBUMIN 2.8* 3.8   Recent Labs  Lab 06/01/21 1408  LIPASE 27   No results for input(s): AMMONIA in the last 168 hours. Coagulation Profile: No results for input(s): INR, PROTIME in the last 168 hours. Cardiac Enzymes: No results for input(s): CKTOTAL, CKMB, CKMBINDEX, TROPONINI in the last 168 hours. BNP (last 3 results) No results for input(s): PROBNP in the last 8760 hours. HbA1C: No results for input(s): HGBA1C in the last 72 hours. CBG: No results for input(s): GLUCAP in the last 168 hours. Lipid Profile: No results for input(s): CHOL, HDL, LDLCALC, TRIG, CHOLHDL, LDLDIRECT in the last 72 hours. Thyroid Function Tests: No results for input(s): TSH, T4TOTAL, FREET4, T3FREE, THYROIDAB in the last 72 hours. Anemia Panel: No results for input(s): VITAMINB12, FOLATE, FERRITIN, TIBC, IRON, RETICCTPCT in the last 72 hours. Urine analysis:    Component Value Date/Time   COLORURINE YELLOW 06/01/2021 1327   APPEARANCEUR HAZY (A) 06/01/2021 1327   LABSPEC 1.023 06/01/2021 1327   PHURINE  5.0 06/01/2021 1327   GLUCOSEU NEGATIVE 06/01/2021 1327   HGBUR NEGATIVE 06/01/2021 1327   BILIRUBINUR NEGATIVE 06/01/2021 1327   KETONESUR 20 (A) 06/01/2021 1327   PROTEINUR NEGATIVE 06/01/2021 1327   UROBILINOGEN 0.2 08/23/2015 1555   NITRITE NEGATIVE 06/01/2021 1327   LEUKOCYTESUR TRACE (A) 06/01/2021 1327    Radiological Exams on Admission: CT Abdomen Pelvis Wo Contrast  Result Date: 06/01/2021 CLINICAL DATA:  Abdominal distension EXAM: CT ABDOMEN AND PELVIS WITHOUT CONTRAST TECHNIQUE: Multidetector CT imaging of the abdomen and pelvis was performed following the standard protocol without IV contrast. COMPARISON:  05/18/2021 FINDINGS: LOWER CHEST: Multiple nodular and cavitary lesions of the lung bases, unchanged. Unchanged size of right lower lobe index nodule (6:4) measuring 15 x 15 mm. HEPATOBILIARY: Normal hepatic contours. No intra- or extrahepatic biliary dilatation. Unchanged hepatic cysts. Status post cholecystectomy. PANCREAS: Postsurgical changes. SPLEEN: Surgically absent. ADRENALS/URINARY TRACT: The adrenal glands are normal. Moderate right hydroureteronephrosis   is unchanged, due to unchanged 8 mm distal right ureteral calculus. No left hydronephrosis. The urinary bladder is normal for degree of distention STOMACH/BOWEL: There are stents in the sigmoid colon and distal duodenum. There is diffuse colonic and small bowel dilatation proximal to the sigmoid colon stent. Previously present pneumatosis coli has resolved. Previously described mass adjacent to the distal duodenum is poorly characterized in the absence of intravenous contrast. However, it appears to be unchanged, measuring 2.9 x 2.5 cm. VASCULAR/LYMPHATIC: There is calcific atherosclerosis of the abdominal aorta. No lymphadenopathy. REPRODUCTIVE: Enlarged prostate measures 5.6 cm in transverse dimension. MUSCULOSKELETAL. Multilevel degenerative disc disease and facet arthrosis. No bony spinal canal stenosis. OTHER: None.  IMPRESSION: 1. Diffuse colonic and small bowel dilatation proximal to the sigmoid colon stent, which is likely obstructed. Previously present pneumatosis coli has resolved. 2. Unchanged appearance of 8 mm distal right ureteral calculus with moderate right hydroureteronephrosis. 3. Unchanged appearance of multiple nodular and cavitary metastases of the lung bases. 4. Unchanged appearance of mass adjacent to the distal duodenum. Aortic Atherosclerosis (ICD10-I70.0). Electronically Signed   By: Ulyses Jarred M.D.   On: 06/01/2021 19:44     EKG: Independently reviewed, with result as described above.    Assessment/Plan   Kevin Villegas is a 80 y.o. male with medical history significant for pancreatic cancer, large bowel obstruction, colonic stricture status post recent placement of sigmoid colonic stent who is admitted to [facility] on 06/01/2021 with large bowel obstruction in the setting of obstructed colonic stent after presenting from home to [facility] ED complaining of constipation.   Principal Problem:   Large bowel obstruction (HCC) Active Problems:   Dehydration   Acute prerenal azotemia   Leukocytosis   Right ureteral stone      #) Large bowel obstruction: In the setting of sigmoid colonic stent placement on 05/26/2021, the patient presents with complaint of no interval bowel movement and diminished interval flatus production, with CT abdomen/pelvis performed in the ED this evening demonstrating evidence of diffuse colonic and small bowel dilation proximal to the sigmoid colon stent, suggestive of obstruction of this colonic stent.   The EDP discussed the patient's case and updated abdominal imaging with the on-call [facility] gastroenterologist, Dr. Erin Fulling, who requested that the patient be admitted to the hospital service, with plan for formal gastroenterology consultation in the morning, at which time colonoscopy is anticipated.   Of note, patient residual abdominal discomfort at this  time and also denies any nausea.  Physical exam demonstrates no evidence of acute peritoneal signs.  We will keep n.p.o., provide maintenance IV fluids overnight, and symptomatic management leading up to tomorrow morning's gastroenterology consultation/colonoscopy.   Plan: Gastroenterology formally consulted, with plan for colonoscopy in the morning.  NPO.  As needed IV fentanyl. prn IV Zofran.  Lactated Ringer's at 100 cc/h.  Repeat CMP and CBC in the morning.  Check INR.       #) Dehydration: In the setting of diminished oral intake over the course the last week, with almost no p.o. intake over the last 48 hours, the patient presents with laboratory evidence to suggest dehydration, including the appearance of dry oral mucous membranes, elevated specific gravity on presenting urinalysis, in the presence of acute prerenal azotemia.  He is being admitted for further evaluation management of presenting large bowel obstruction in the setting of colonic stent obstruction, with anoscopy Brody Kump to alleviate this obstruction leading to ultimate resumption of p.o. intake.  We will provide IV fluids overnight as  component of supportive to tomorrow morning's anticipated colonoscopy.  Presentation has not been associated with hypotension.  Plan: Monitor strict I's and O's and daily weights.  Lactated Ringer's at 100 cc/h overnight.  Repeat CMP in the morning.        #) Right ureteral stone: Today's CT abdomen/pelvis, in comparison to CT abdomen/pelvis from 05/16/2021, shows unchanged appearance of 8 mm distal right ureteral calculus with associated moderate right hydronephrosis.  The urologic plan for this obstructing right ureteral stone is not currently clear to me.  It is possible that plan for urologic intervention has been delayed in the setting of prioritizing need for colonic stent placement, as above.  Of note, the patient's creatinine appears elevated into the range of 1.5-2.0 since mid May 2022,  potentially representing a acute kidney injury over that time as I have not identified any preceding 1 year to establish AKI versus CKD.  He denies any acute urinary symptoms, and denies any recent right flank discomfort presenting urine does not appear to be infected, and criteria are not met for sepsis at this time.  Plan: Additional discussions with the patient as well as additional chart review taking clarification into the current urologic plan relating to this obstructing right ureteral stone.  As needed IV fentanyl.  Monitor strict I's and O's and daily weights.  Repeat BMP in the morning.      #) Leukocytosis: Presenting with blood cell count of 15,600.  No evidence of underlying infectious process at this time, as further detailed above.  Rather, presenting elevation of my blood cell count is suspected to be multifactorial in etiology, with contributions from hemoconcentration due to presenting dehydration given decline in oral intake over the course of the last week, as above, in addition to likely contributory inflammatory element in the setting of presenting large bowel obstruction, as further detailed above.  SIRS criteria not met for sepsis at this time.  Plan: Repeat CBC with differential in the morning.  GI consulted for colonoscopy in the morning, as above lactated Ringer's at 100 cc/h.  Monitor strict I's and O's and daily weights.         DVT prophylaxis: scd's  Code Status: Full code Family Communication: none Disposition Plan: Per Rounding Team  Consults called: case/imaging discussed with on-call gastroenterologist, Dr.Karki, As further described above Admission status: Observation; MedSurg     Of note, this patient was added by me to the following Admit List/Treatment Team: [email or web address].      PLEASE NOTE THAT DRAGON DICTATION SOFTWARE WAS USED IN THE CONSTRUCTION OF THIS NOTE.   [facility] Pager [phone] From [place]  Otherwise, please contact night-coverage  [email or web address] Password [facility]   06/01/2021, 9:05 PM

## 2021-06-01 NOTE — ED Provider Notes (Signed)
[facility] DEPT Provider Note   CSN: [id number] Arrival date & time: 06/01/21  1214     History Chief Complaint  Patient presents with   Constipation   Abdominal Pain    Kevin Villegas is a 80 y.o. male.  80 year old male presents with constipation x6 days.  Patient had a colonic stent placed at that time and has not had a bowel movement since then.  Took mag citrate last night which caused severe cramping which is since resolved.  Denies any fever or vomiting.  No abdominal pain at this moment.  Denies any urinary symptoms.  Called his GI doctor and was told to come here for a CAT scan to evaluate for bowel obstruction      Past Medical History:  Diagnosis Date   Pancreatic cancer [facility])     Patient Active Problem List   Diagnosis Date Noted   Pneumatosis coli    Colon stricture [facility])    Colon obstruction ([place]) 05/18/2021   Pancreatic mass 06/27/2019   Malignant neoplasm of tail of pancreas ([place]) 06/27/2019   Rhinitis medicamentosa 02/26/2019    Past Surgical History:  Procedure Laterality Date   ABDOMINAL SURGERY     BIOPSY  05/20/2021   Procedure: BIOPSY;  Surgeon: Otis Brace, MD;  Location: [facility];  Service: Gastroenterology;;   CHOLECYSTECTOMY N/A 12/16/2014   Procedure: LAPAROSCOPIC CHOLECYSTECTOMY WITH INTRAOPERATIVE CHOLANGIOGRAM;  Surgeon: Georganna Skeans, MD;  Location: [facility];  Service: General;  Laterality: N/A;   COLONIC STENT PLACEMENT N/A 05/26/2021   Procedure: COLONIC STENT PLACEMENT;  Surgeon: Clarene Essex, MD;  Location: [facility];  Service: Endoscopy;  Laterality: N/A;   ESOPHAGOGASTRODUODENOSCOPY N/A 05/20/2021   Procedure: ESOPHAGOGASTRODUODENOSCOPY (EGD);  Surgeon: Otis Brace, MD;  Location: [facility];  Service: Gastroenterology;  Laterality: N/A;   EUS N/A 09/24/2015   Procedure: UPPER ENDOSCOPIC ULTRASOUND (EUS) LINEAR;  Surgeon: Milus Banister, MD;  Location: [facility];  Service: Endoscopy;   Laterality: N/A;   FLEXIBLE SIGMOIDOSCOPY N/A 05/20/2021   Procedure: FLEXIBLE SIGMOIDOSCOPY;  Surgeon: Otis Brace, MD;  Location: [facility];  Service: Gastroenterology;  Laterality: N/A;   FLEXIBLE SIGMOIDOSCOPY N/A 05/26/2021   Procedure: FLEXIBLE SIGMOIDOSCOPY;  Surgeon: Clarene Essex, MD;  Location: [facility];  Service: Endoscopy;  Laterality: N/A;   VASECTOMY         Family History  Problem Relation Age of Onset   Stroke Mother     Social History   Tobacco Use   Smoking status: Never   Smokeless tobacco: Never  Vaping Use   Vaping Use: Never used  Substance Use Topics   Alcohol use: No    Alcohol/week: 0.0 standard drinks   Drug use: No    Home Medications Prior to Admission medications   Medication Sig Start Date End Date Taking? Authorizing Provider  dicyclomine (BENTYL) 20 MG tablet Take 0.5 tablets (10 mg total) by mouth every 6 (six) hours as needed (For abdominal cramping). 05/16/21   Molpus, John, MD  lidocaine-prilocaine (EMLA) cream Apply 1 application topically as needed. Port access 05/04/21   [provider]  Multiple Vitamin (MULTIVITAMIN WITH MINERALS) TABS tablet Take 1 tablet by mouth daily. 05/26/21   Georgette Shell, MD  ondansetron (ZOFRAN) 8 MG tablet Take 8 mg by mouth every 8 (eight) hours as needed for nausea or vomiting. 05/04/21   [provider]  polyethylene glycol (MIRALAX) 17 g packet Take 17 g by mouth 2 (two) times daily. 05/26/21   Landis Gandy  G, MD  prochlorperazine (COMPAZINE) 5 MG tablet Take 5 mg by mouth every 6 (six) hours as needed for nausea or vomiting. 05/04/21   [provider]    Allergies    Pollen extract  Review of Systems   Review of Systems  All other systems reviewed and are negative.  Physical Exam Updated Vital Signs BP (!) 143/106 (BP Location: Right Arm)   Pulse 94   Temp 97.9 F (36.6 C) (Oral)   Resp 16   SpO2 95%   Physical Exam Vitals and nursing note reviewed.   Constitutional:      General: He is not in acute distress.    Appearance: Normal appearance. He is well-developed. He is not toxic-appearing.  HENT:     Head: Normocephalic and atraumatic.  Eyes:     General: Lids are normal.     Conjunctiva/sclera: Conjunctivae normal.     Pupils: Pupils are equal, round, and reactive to light.  Neck:     Thyroid: No thyroid mass.     Trachea: No tracheal deviation.  Cardiovascular:     Rate and Rhythm: Normal rate and regular rhythm.     Heart sounds: Normal heart sounds. No murmur heard.   No gallop.  Pulmonary:     Effort: Pulmonary effort is normal. No respiratory distress.     Breath sounds: Normal breath sounds. No stridor. No decreased breath sounds, wheezing, rhonchi or rales.  Abdominal:     General: There is no distension.     Palpations: Abdomen is soft.     Tenderness: There is no abdominal tenderness. There is no rebound.  Musculoskeletal:        General: No tenderness. Normal range of motion.     Cervical back: Normal range of motion and neck supple.  Skin:    General: Skin is warm and dry.     Findings: No abrasion or rash.  Neurological:     Mental Status: He is alert and oriented to person, place, and time. Mental status is at baseline.     GCS: GCS eye subscore is 4. GCS verbal subscore is 5. GCS motor subscore is 6.     Cranial Nerves: Cranial nerves are intact. No cranial nerve deficit.     Sensory: No sensory deficit.     Motor: Motor function is intact.  Psychiatric:        Attention and Perception: Attention normal.        Speech: Speech normal.        Behavior: Behavior normal.    ED Results / Procedures / Treatments   Labs (all labs ordered are listed, but only abnormal results are displayed) Labs Reviewed  COMPREHENSIVE METABOLIC PANEL - Abnormal; Notable for the following components:      Result Value   Glucose, Bld 106 (*)    BUN 38 (*)    Creatinine, Ser 1.84 (*)    AST 44 (*)    GFR, Estimated 37 (*)     All other components within normal limits  CBC - Abnormal; Notable for the following components:   WBC 15.6 (*)    Platelets 498 (*)    All other components within normal limits  LIPASE, BLOOD  URINALYSIS, ROUTINE W REFLEX MICROSCOPIC    EKG None  Radiology No results found.  Procedures Procedures   Medications Ordered in ED Medications - No data to display  ED Course  I have reviewed the triage vital signs and the nursing notes.  Pertinent labs & imaging results that were available during my care of the patient were reviewed by me and considered in my medical decision making (see chart for details).    MDM Rules/Calculators/A&P                          Patient CT scan consistent with obstruction.  Discussed with Dr. Therisa Doyne on-call for [facility] who recommends hospitalist admission and they will do colonoscopy tomorrow Final Clinical Impression(s) / ED Diagnoses Final diagnoses:  None    Rx / DC Orders ED Discharge Orders     None        Lacretia Leigh, MD 06/01/21 2034

## 2021-06-01 NOTE — Progress Notes (Signed)
Brief note regarding plan, with full H&P to follow:  80 year old male who underwent colonic stent placement on 05/26/2021 who is admitted for bowel obstruction after presenting to [facility] ED at the instruction of his outpatient gastroenterologist for further evaluation is constipation since the time of colonic stent placement.  Specifically, patient denies any bowel movement since the placement of colonic stent.  CT abdomen performed in the ED today reportedly showed evidence of colonic stent obstruction.  Case/imaging were discussed with the on-call [facility] gastroenterologist, Dr. Therisa Doyne, who recommended admission to the hospitalist service, with plan for [facility] gastroenterology to formally see the patient tomorrow morning (06/02/21), at which time they are planning to perform colonoscopy.  N.p.o. in the meantime.  Continuous IV fluids.  As needed IV fentanyl.  As needed Zofran.    Babs Bertin, DO Hospitalist

## 2021-06-01 NOTE — ED Triage Notes (Signed)
Pt complains of constipation and abdominal pain x 1 week. Pt had stent placed last Wednesday. He tried laxative, did not help.

## 2021-06-02 ENCOUNTER — Encounter (HOSPITAL_COMMUNITY): Payer: Self-pay | Admitting: Internal Medicine

## 2021-06-02 ENCOUNTER — Ambulatory Visit: Payer: Medicare Other | Admitting: Family Medicine

## 2021-06-02 ENCOUNTER — Other Ambulatory Visit: Payer: Self-pay

## 2021-06-02 DIAGNOSIS — K56609 Unspecified intestinal obstruction, unspecified as to partial versus complete obstruction: Secondary | ICD-10-CM | POA: Diagnosis present

## 2021-06-02 DIAGNOSIS — N1831 Chronic kidney disease, stage 3a: Secondary | ICD-10-CM | POA: Diagnosis present

## 2021-06-02 DIAGNOSIS — Z20822 Contact with and (suspected) exposure to covid-19: Secondary | ICD-10-CM | POA: Diagnosis present

## 2021-06-02 DIAGNOSIS — Z79899 Other long term (current) drug therapy: Secondary | ICD-10-CM | POA: Diagnosis not present

## 2021-06-02 DIAGNOSIS — Z9081 Acquired absence of spleen: Secondary | ICD-10-CM | POA: Diagnosis not present

## 2021-06-02 DIAGNOSIS — D72829 Elevated white blood cell count, unspecified: Secondary | ICD-10-CM | POA: Diagnosis present

## 2021-06-02 DIAGNOSIS — E860 Dehydration: Secondary | ICD-10-CM | POA: Diagnosis present

## 2021-06-02 DIAGNOSIS — N132 Hydronephrosis with renal and ureteral calculous obstruction: Secondary | ICD-10-CM | POA: Diagnosis present

## 2021-06-02 DIAGNOSIS — N179 Acute kidney failure, unspecified: Secondary | ICD-10-CM | POA: Diagnosis present

## 2021-06-02 DIAGNOSIS — K5669 Other partial intestinal obstruction: Secondary | ICD-10-CM | POA: Diagnosis present

## 2021-06-02 DIAGNOSIS — T85898A Other specified complication of other internal prosthetic devices, implants and grafts, initial encounter: Secondary | ICD-10-CM | POA: Diagnosis present

## 2021-06-02 DIAGNOSIS — C786 Secondary malignant neoplasm of retroperitoneum and peritoneum: Secondary | ICD-10-CM | POA: Diagnosis present

## 2021-06-02 DIAGNOSIS — Z90411 Acquired partial absence of pancreas: Secondary | ICD-10-CM | POA: Diagnosis not present

## 2021-06-02 DIAGNOSIS — Z823 Family history of stroke: Secondary | ICD-10-CM | POA: Diagnosis not present

## 2021-06-02 DIAGNOSIS — Z6820 Body mass index (BMI) 20.0-20.9, adult: Secondary | ICD-10-CM | POA: Diagnosis not present

## 2021-06-02 DIAGNOSIS — Z8507 Personal history of malignant neoplasm of pancreas: Secondary | ICD-10-CM | POA: Diagnosis not present

## 2021-06-02 DIAGNOSIS — E43 Unspecified severe protein-calorie malnutrition: Secondary | ICD-10-CM | POA: Diagnosis present

## 2021-06-02 DIAGNOSIS — C7800 Secondary malignant neoplasm of unspecified lung: Secondary | ICD-10-CM | POA: Diagnosis present

## 2021-06-02 DIAGNOSIS — Z9049 Acquired absence of other specified parts of digestive tract: Secondary | ICD-10-CM | POA: Diagnosis not present

## 2021-06-02 DIAGNOSIS — N19 Unspecified kidney failure: Secondary | ICD-10-CM | POA: Diagnosis not present

## 2021-06-02 DIAGNOSIS — R7989 Other specified abnormal findings of blood chemistry: Secondary | ICD-10-CM | POA: Diagnosis present

## 2021-06-02 DIAGNOSIS — Y738 Miscellaneous gastroenterology and urology devices associated with adverse incidents, not elsewhere classified: Secondary | ICD-10-CM | POA: Diagnosis present

## 2021-06-02 DIAGNOSIS — Z903 Acquired absence of stomach [part of]: Secondary | ICD-10-CM | POA: Diagnosis not present

## 2021-06-02 DIAGNOSIS — N201 Calculus of ureter: Secondary | ICD-10-CM | POA: Diagnosis present

## 2021-06-02 LAB — COMPREHENSIVE METABOLIC PANEL
ALT: 36 U/L (ref 0–44)
AST: 38 U/L (ref 15–41)
Albumin: 3.6 g/dL (ref 3.5–5.0)
Alkaline Phosphatase: 67 U/L (ref 38–126)
Anion gap: 12 (ref 5–15)
BUN: 46 mg/dL — ABNORMAL HIGH (ref 8–23)
CO2: 24 mmol/L (ref 22–32)
Calcium: 8.9 mg/dL (ref 8.9–10.3)
Chloride: 98 mmol/L (ref 98–111)
Creatinine, Ser: 1.89 mg/dL — ABNORMAL HIGH (ref 0.61–1.24)
GFR, Estimated: 36 mL/min — ABNORMAL LOW (ref >60)
Glucose, Bld: 106 mg/dL — ABNORMAL HIGH (ref 70–99)
Potassium: 4.6 mmol/L (ref 3.5–5.1)
Sodium: 134 mmol/L — ABNORMAL LOW (ref 135–145)
Total Bilirubin: 1.6 mg/dL — ABNORMAL HIGH (ref 0.3–1.2)
Total Protein: 7.5 g/dL (ref 6.5–8.1)

## 2021-06-02 LAB — CBC
HCT: 43.9 % (ref 39.0–52.0)
Hemoglobin: 14.4 g/dL (ref 13.0–17.0)
MCH: 31.8 pg (ref 26.0–34.0)
MCHC: 32.8 g/dL (ref 30.0–36.0)
MCV: 96.9 fL (ref 80.0–100.0)
Platelets: 530 10*3/uL — ABNORMAL HIGH (ref 150–400)
RBC: 4.53 MIL/uL (ref 4.22–5.81)
RDW: 14.3 % (ref 11.5–15.5)
WBC: 17.1 10*3/uL — ABNORMAL HIGH (ref 4.0–10.5)
nRBC: 0 % (ref 0.0–0.2)

## 2021-06-02 LAB — PROTIME-INR
INR: 1.1 (ref 0.8–1.2)
Prothrombin Time: 14.6 seconds (ref 11.4–15.2)

## 2021-06-02 LAB — MAGNESIUM: Magnesium: 2.6 mg/dL — ABNORMAL HIGH (ref 1.7–2.4)

## 2021-06-02 MED ORDER — PROSOURCE PLUS PO LIQD
30.0000 mL | Freq: Two times a day (BID) | ORAL | Status: DC
  Administered 2021-06-02 – 2021-06-03 (×2): 30 mL via ORAL
  Filled 2021-06-02 (×3): qty 30

## 2021-06-02 MED ORDER — SODIUM CHLORIDE 0.9% FLUSH
10.0000 mL | Freq: Two times a day (BID) | INTRAVENOUS | Status: DC
  Administered 2021-06-03 – 2021-06-04 (×2): 10 mL

## 2021-06-02 MED ORDER — BOOST / RESOURCE BREEZE PO LIQD CUSTOM
1.0000 | ORAL | Status: DC
  Administered 2021-06-03 – 2021-06-04 (×2): 1 via ORAL

## 2021-06-02 MED ORDER — CHLORHEXIDINE GLUCONATE CLOTH 2 % EX PADS: 6.0000 | MEDICATED_PAD | Freq: Every day | CUTANEOUS | Status: DC

## 2021-06-02 MED ORDER — ADULT MULTIVITAMIN W/MINERALS CH
1.0000 | ORAL_TABLET | Freq: Every day | ORAL | Status: DC
  Administered 2021-06-03 – 2021-06-04 (×2): 1 via ORAL
  Filled 2021-06-02 (×3): qty 1

## 2021-06-02 MED ORDER — SODIUM CHLORIDE 0.9% FLUSH: 10.0000 mL | INTRAVENOUS | Status: DC | PRN

## 2021-06-02 MED ORDER — CHLORHEXIDINE GLUCONATE CLOTH 2 % EX PADS
6.0000 | MEDICATED_PAD | Freq: Every day | CUTANEOUS | Status: DC
  Administered 2021-06-03 – 2021-06-05 (×2): 6 via TOPICAL

## 2021-06-02 MED ORDER — DEXTROSE-NACL 5-0.9 % IV SOLN: INTRAVENOUS | Status: DC

## 2021-06-02 NOTE — Progress Notes (Addendum)
PROGRESS NOTE  Kevin Villegas XBJ:[id number] DOB: November 22, 1941 DOA: 06/01/2021 PCP: Dettinger, [name], MD   LOS: 0 days   Brief narrative:  Kevin Villegas is a 80 y.o. male with medical history significant for pancreatic cancer, large bowel obstruction, colonic stricture status post recent placement of sigmoid colonic stent was admitted hospital for abdominal pain and constipation nausea and vomiting.  He contacted his outpatient gastroenterologist and was admitted to the hospital subsequently for bowel obstruction.  In the ED vitals were stable.  CBC was elevated at 15,600.  Patient was then admitted hospital for  large bowel obstruction in the setting of suspected obstructive sigmoid colonic stent.     Assessment/Plan:  Principal Problem:   Large bowel obstruction (HCC) Active Problems:   Dehydration   Acute prerenal azotemia   Leukocytosis   Right ureteral stone   Protein-calorie malnutrition, severe   Bowel obstruction (HCC)   Large bowel obstruction:  History of sigmoid colonic stent placement on 05/26/2021, CT scan this time showing obstruction.  Follow GI up pending.  Continue supportive care including IV fluids antiemetics and other supportive care.     Volume depletion/dehydration:  Continue IV fluid hydration.      Right ureteral stone:  Unchanged from prior with mild to moderate right hydronephrosis.  Urological intervention has been delayed in the setting of prior testing for colonic stent placement stent.  Creatinine levels.  Continue IV fluid hydration.  Creatinine of 1.8.    Leukocytosis: Without obvious source of infection.  Could be reactive in nature.  Continue to monitor closely.  Continue IV fluids.     Severe Protein calorie malnutrition.  Present on admission.  Dietitian on board.  Will follow recommendations.    DVT prophylaxis: SCDs Start: 06/01/21 2102   Code Status: Full code  Family Communication: None  Status is: Observation  The patient will  require care spanning > 2 midnights and should be moved to inpatient because: IV treatments appropriate due to intensity of illness or inability to take PO and Inpatient level of care appropriate due to severity of illness  Dispo: The patient is from: Home              Anticipated d/c is to: Home              Patient currently is not medically stable to d/c.   Difficult to place patient No  Consultants: GI  Procedures: None so far  Anti-infectives:  None  Anti-infectives (From admission, onward)    None        Subjective: Today, patient was seen and examined at bedside.  He has not had a bowel movement in few days.  Was drinking some liquid for CT scan but could not hold down.  Had multiple episodes of vomiting yesterday.  Has intermittent abdominal pain.  Objective: Vitals:   06/02/21 0531 06/02/21 1003  BP: (!) 156/92 (!) 157/90  Pulse: 73 67  Resp: 16 18  Temp: 98.4 F (36.9 C) 98.3 F (36.8 C)  SpO2: 95% 95%    Intake/Output Summary (Last 24 hours) at 06/02/2021 1120 Last data filed at 06/02/2021 1040 Gross per 24 hour  Intake 833.97 ml  Output 450 ml  Net 383.97 ml   Filed Weights   06/01/21 2341 06/02/21 0500  Weight: 69.2 kg 68.8 kg   Body mass index is 21.15 kg/m.   Physical Exam: GENERAL: Patient is alert awake and oriented. Not in obvious distress. HENT: No scleral pallor or icterus. Pupils  equally reactive to light. Oral mucosa is moist NECK: is supple, no gross swelling noted. CHEST: Clear to auscultation. No crackles or wheezes.  Diminished breath sounds bilaterally. CVS: S1 and S2 heard, no murmur. Regular rate and rhythm.  ABDOMEN: Soft, non-tender, mild abdominal distention, bowel sounds present  EXTREMITIES: No edema. CNS: Cranial nerves are intact. No focal motor deficits. SKIN: warm and dry without rashes.  Data Review: I have personally reviewed the following laboratory data and studies,  CBC: Recent Labs  Lab 06/01/21 1408  06/02/21 0407  WBC 15.6* 17.1*  HGB 14.9 14.4  HCT 44.9 43.9  MCV 97.2 96.9  PLT 498* 341*   Basic Metabolic Panel: Recent Labs  Lab 06/01/21 1408 06/02/21 0407  NA 135 134*  K 4.9 4.6  CL 100 98  CO2 22 24  GLUCOSE 106* 106*  BUN 38* 46*  CREATININE 1.84* 1.89*  CALCIUM 9.3 8.9  MG 2.6* 2.6*   Liver Function Tests: Recent Labs  Lab 06/01/21 1408 06/02/21 0407  AST 44* 38  ALT 42 36  ALKPHOS 68 67  BILITOT 1.1 1.6*  PROT 7.9 7.5  ALBUMIN 3.8 3.6   Recent Labs  Lab 06/01/21 1408  LIPASE 27   No results for input(s): AMMONIA in the last 168 hours. Cardiac Enzymes: No results for input(s): CKTOTAL, CKMB, CKMBINDEX, TROPONINI in the last 168 hours. BNP (last 3 results) No results for input(s): BNP in the last 8760 hours.  ProBNP (last 3 results) No results for input(s): PROBNP in the last 8760 hours.  CBG: No results for input(s): GLUCAP in the last 168 hours. Recent Results (from the past 240 hour(s))  Resp Panel by RT-PCR (Flu A&B, Covid) Nasopharyngeal Swab     Status: None   Collection Time: 06/01/21  8:35 PM   Specimen: Nasopharyngeal Swab; Nasopharyngeal(NP) swabs in vial transport medium  Result Value Ref Range Status   SARS Coronavirus 2 by RT PCR NEGATIVE NEGATIVE Final    Comment: (NOTE) SARS-CoV-2 target nucleic acids are NOT DETECTED.  The SARS-CoV-2 RNA is generally detectable in upper respiratory specimens during the acute phase of infection. The lowest concentration of SARS-CoV-2 viral copies this assay can detect is 138 copies/mL. A negative result does not preclude SARS-Cov-2 infection and should not be used as the sole basis for treatment or other patient management decisions. A negative result may occur with  improper specimen collection/handling, submission of specimen other than nasopharyngeal swab, presence of viral mutation(s) within the areas targeted by this assay, and inadequate number of viral copies(<138 copies/mL). A  negative result must be combined with clinical observations, patient history, and epidemiological information. The expected result is Negative.  Fact Sheet for Patients:  EntrepreneurPulse.com.au  Fact Sheet for Healthcare Providers:  IncredibleEmployment.be  This test is no t yet approved or cleared by the [place] FDA and  has been authorized for detection and/or diagnosis of SARS-CoV-2 by FDA under an Emergency Use Authorization (EUA). This EUA will remain  in effect (meaning this test can be used) for the duration of the COVID-19 declaration under Section 564(b)(1) of the Act, 21 U.S.C.section 360bbb-3(b)(1), unless the authorization is terminated  or revoked sooner.       Influenza A by PCR NEGATIVE NEGATIVE Final   Influenza B by PCR NEGATIVE NEGATIVE Final    Comment: (NOTE) The Xpert Xpress SARS-CoV-2/FLU/RSV plus assay is intended as an aid in the diagnosis of influenza from Nasopharyngeal swab specimens and should not be used as a sole  basis for treatment. Nasal washings and aspirates are unacceptable for Xpert Xpress SARS-CoV-2/FLU/RSV testing.  Fact Sheet for Patients: EntrepreneurPulse.com.au  Fact Sheet for Healthcare Providers: IncredibleEmployment.be  This test is not yet approved or cleared by the [place] FDA and has been authorized for detection and/or diagnosis of SARS-CoV-2 by FDA under an Emergency Use Authorization (EUA). This EUA will remain in effect (meaning this test can be used) for the duration of the COVID-19 declaration under Section 564(b)(1) of the Act, 21 U.S.C. section 360bbb-3(b)(1), unless the authorization is terminated or revoked.  Performed at [facility], [place] [address] [place], [place] [address]      Studies: CT Abdomen Pelvis Wo Contrast  Result Date: 06/01/2021 CLINICAL DATA:  Abdominal distension EXAM: CT ABDOMEN AND  PELVIS WITHOUT CONTRAST TECHNIQUE: Multidetector CT imaging of the abdomen and pelvis was performed following the standard protocol without IV contrast. COMPARISON:  05/18/2021 FINDINGS: LOWER CHEST: Multiple nodular and cavitary lesions of the lung bases, unchanged. Unchanged size of right lower lobe index nodule (6:4) measuring 15 x 15 mm. HEPATOBILIARY: Normal hepatic contours. No intra- or extrahepatic biliary dilatation. Unchanged hepatic cysts. Status post cholecystectomy. PANCREAS: Postsurgical changes. SPLEEN: Surgically absent. ADRENALS/URINARY TRACT: The adrenal glands are normal. Moderate right hydroureteronephrosis is unchanged, due to unchanged 8 mm distal right ureteral calculus. No left hydronephrosis. The urinary bladder is normal for degree of distention STOMACH/BOWEL: There are stents in the sigmoid colon and distal duodenum. There is diffuse colonic and small bowel dilatation proximal to the sigmoid colon stent. Previously present pneumatosis coli has resolved. Previously described mass adjacent to the distal duodenum is poorly characterized in the absence of intravenous contrast. However, it appears to be unchanged, measuring 2.9 x 2.5 cm. VASCULAR/LYMPHATIC: There is calcific atherosclerosis of the abdominal aorta. No lymphadenopathy. REPRODUCTIVE: Enlarged prostate measures 5.6 cm in transverse dimension. MUSCULOSKELETAL. Multilevel degenerative disc disease and facet arthrosis. No bony spinal canal stenosis. OTHER: None. IMPRESSION: 1. Diffuse colonic and small bowel dilatation proximal to the sigmoid colon stent, which is likely obstructed. Previously present pneumatosis coli has resolved. 2. Unchanged appearance of 8 mm distal right ureteral calculus with moderate right hydroureteronephrosis. 3. Unchanged appearance of multiple nodular and cavitary metastases of the lung bases. 4. Unchanged appearance of mass adjacent to the distal duodenum. Aortic Atherosclerosis (ICD10-I70.0).  Electronically Signed   By: Ulyses Jarred M.D.   On: 06/01/2021 19:44      Flora Lipps, MD  [facility] 06/02/2021  If 7PM-7AM, please contact night-coverage

## 2021-06-02 NOTE — Progress Notes (Signed)
Initial Nutrition Assessment  DOCUMENTATION CODES:   Severe malnutrition in context of chronic illness  INTERVENTION:  - will order Boost Breeze once/day, each supplement provides 250 kcal and 9 grams of protein. - will order 30 ml Prosource Plus BID, each supplement provides 100 kcal and 15 grams protein.  - will order 1 tablet multivitamin with minerals/day. - continue diet advancement as medically feasible.   NUTRITION DIAGNOSIS:   Severe Malnutrition related to chronic illness, cancer and cancer related treatments as evidenced by moderate fat depletion, moderate muscle depletion, severe muscle depletion, percent weight loss.  GOAL:   Patient will meet greater than or equal to 90% of their needs  MONITOR:   Diet advancement, PO intake, Supplement acceptance, Labs, Weight trends, I & O's  REASON FOR ASSESSMENT:   Malnutrition Screening Tool  ASSESSMENT:   80 y.o. male with medical history of pancreatic cancer, large bowel obstruction, colonic stricture s/p recent placement of sigmoid colonic stent. He presented to the ED d/t ongoing abdominal pain, constipation, and N/V. He was found to have a large bowel obstruction in the setting of suspected obstructive sigmoid colonic stent.  Patient sitting on the side of the bed with no family or visitors present. Patient reports that he got up to urinate and that he passed some gas which resulted in a small amount of liquid stool. Patient reports this was very unexpected as he has not had a BM or even been able to pass gas for 1 week.   He states that the last time he ate was on Sunday (6/12) because he was not having BMs and experiencing abdominal pain/pressure. He reports needing to drink contrast last night and that he subsequently had 3 episodes of emesis.   Diet was advanced from NPO to CLD shortly before RD visit and patient had not yet had anything.   He reports that for the past 2-3 weeks he has had minimal PO intake, weight  loss from UBW of 165-170 lb, and progressive weakness. He has not experienced any falls d/t weakness.   Weight today is 152 lb and weight on 04/28/21 was 164 lb. This indicates 12 lb weight loss (7.3% body weight) in the past 1 month; significant for time frame.  Per notes: - large bowel obstruction  - colonic stent placement on 6/8 - volume depletion/dehydration - R ureteral stone - leukocytosis   Labs reviewed; Na: 134 mmol/l, BUN: 46 mg/dl, creatinine: 1.89 mg/dl, Mg: 2.6 mg/dl, GFR: 36 ml/min. Medications reviewed. IVF; LR @ 75 ml/hr.      NUTRITION - FOCUSED PHYSICAL EXAM:  Flowsheet Row Most Recent Value  Orbital Region Moderate depletion  Upper Arm Region Moderate depletion  Thoracic and Lumbar Region Unable to assess  Buccal Region Severe depletion  Clavicle Bone Region Moderate depletion  Clavicle and Acromion Bone Region Severe depletion  Scapular Bone Region Severe depletion  Dorsal Hand Moderate depletion  Patellar Region Severe depletion  Anterior Thigh Region Moderate depletion  Posterior Calf Region Severe depletion  Edema (RD Assessment) None  Hair Reviewed  Eyes Reviewed  Mouth Reviewed  Skin Reviewed  Nails Reviewed       Diet Order:   Diet Order             Diet clear liquid Room service appropriate? Yes; Fluid consistency: Thin  Diet effective now                   EDUCATION NEEDS:   Not appropriate for education at  this time  Skin:  Skin Assessment: Reviewed RN Assessment  Last BM:  PTA (one week ago per patient)  Height:   Ht Readings from Last 1 Encounters:  06/01/21 5\' 11"  (1.803 m)    Weight:   Wt Readings from Last 1 Encounters:  06/02/21 68.8 kg     Estimated Nutritional Needs:  Kcal:  2075-2300 kcal Protein:  110-135 grams Fluid:  >/= 2.3 L/day     Jarome Matin, MS, RD, LDN, CNSC Inpatient Clinical Dietitian RD pager # available in [facility]  After hours/weekend pager # available in [facility]

## 2021-06-02 NOTE — Progress Notes (Addendum)
Subjective/Chief Complaint: Patient known to [facility].  Back in with large bowel obstruction after colonic stint placed for stricture. Abdominal pain improved.  Had liquid BM   Objective: Vital signs in last 24 hours: Temp:  [97.9 F (36.6 C)-98.6 F (37 C)] 98.2 F (36.8 C) (06/15 1340) Pulse Rate:  [67-94] 72 (06/15 1340) Resp:  [16-18] 18 (06/15 1340) BP: (133-176)/(90-106) 133/91 (06/15 1340) SpO2:  [94 %-99 %] 98 % (06/15 1340) Weight:  [68.8 kg-69.2 kg] 68.8 kg (06/15 0500) Last BM Date: 05/25/21  Intake/Output from previous day: 06/14 0701 - 06/15 0700 In: 834 [I.V.:834] Out: 450 [Emesis/NG output:450] Intake/Output this shift: No intake/output data recorded.  Exam: Awake and alert Comfortable in appearance Abdomen full but non-tender  Lab Results:  Recent Labs    06/01/21 1408 06/02/21 0407  WBC 15.6* 17.1*  HGB 14.9 14.4  HCT 44.9 43.9  PLT 498* 530*   BMET Recent Labs    06/01/21 1408 06/02/21 0407  NA 135 134*  K 4.9 4.6  CL 100 98  CO2 22 24  GLUCOSE 106* 106*  BUN 38* 46*  CREATININE 1.84* 1.89*  CALCIUM 9.3 8.9   PT/INR Recent Labs    06/02/21 0407  LABPROT 14.6  INR 1.1   ABG No results for input(s): PHART, HCO3 in the last 72 hours.  Invalid input(s): PCO2, PO2  Studies/Results: CT Abdomen Pelvis Wo Contrast  Result Date: 06/01/2021 CLINICAL DATA:  Abdominal distension EXAM: CT ABDOMEN AND PELVIS WITHOUT CONTRAST TECHNIQUE: Multidetector CT imaging of the abdomen and pelvis was performed following the standard protocol without IV contrast. COMPARISON:  05/18/2021 FINDINGS: LOWER CHEST: Multiple nodular and cavitary lesions of the lung bases, unchanged. Unchanged size of right lower lobe index nodule (6:4) measuring 15 x 15 mm. HEPATOBILIARY: Normal hepatic contours. No intra- or extrahepatic biliary dilatation. Unchanged hepatic cysts. Status post cholecystectomy. PANCREAS: Postsurgical changes. SPLEEN: Surgically absent.  ADRENALS/URINARY TRACT: The adrenal glands are normal. Moderate right hydroureteronephrosis is unchanged, due to unchanged 8 mm distal right ureteral calculus. No left hydronephrosis. The urinary bladder is normal for degree of distention STOMACH/BOWEL: There are stents in the sigmoid colon and distal duodenum. There is diffuse colonic and small bowel dilatation proximal to the sigmoid colon stent. Previously present pneumatosis coli has resolved. Previously described mass adjacent to the distal duodenum is poorly characterized in the absence of intravenous contrast. However, it appears to be unchanged, measuring 2.9 x 2.5 cm. VASCULAR/LYMPHATIC: There is calcific atherosclerosis of the abdominal aorta. No lymphadenopathy. REPRODUCTIVE: Enlarged prostate measures 5.6 cm in transverse dimension. MUSCULOSKELETAL. Multilevel degenerative disc disease and facet arthrosis. No bony spinal canal stenosis. OTHER: None. IMPRESSION: 1. Diffuse colonic and small bowel dilatation proximal to the sigmoid colon stent, which is likely obstructed. Previously present pneumatosis coli has resolved. 2. Unchanged appearance of 8 mm distal right ureteral calculus with moderate right hydroureteronephrosis. 3. Unchanged appearance of multiple nodular and cavitary metastases of the lung bases. 4. Unchanged appearance of mass adjacent to the distal duodenum. Aortic Atherosclerosis (ICD10-I70.0). Electronically Signed   By: Ulyses Jarred M.D.   On: 06/01/2021 19:44    Anti-infectives: Anti-infectives (From admission, onward)    None       Assessment/Plan: Large bowel obstruction, s/p colonic stent  Apparently GI may attempt lower endo this week. From a surgical standpoint, if not successful, will need a colostomy +\- resection Will follow  Addendum:  apparently GI may not perform endo so would then need a colostomy.  Will  discuss this with the patient again tomorrow and see if he would want to go to the OR Friday pending  GI's opinion.   LOS: 0 days    Coralie Keens 06/02/2021

## 2021-06-02 NOTE — Progress Notes (Addendum)
[facility] Gastroenterology Progress Note  Jaret Coppedge 80 y.o. [birth year]  CC:  Colonic obstruction   Subjective: Patient reports nausea/vomiting after drinking oral contrast yesterday. Reports diffuse abdominal pain.  Has not had a BM since colonic stent placement 7 days ago.  ROS : Review of Systems  Cardiovascular:  Negative for chest pain and palpitations.  Gastrointestinal:  Positive for abdominal pain, constipation, nausea and vomiting. Negative for blood in stool, diarrhea, heartburn and melena.     Objective: Vital signs in last 24 hours: Vitals:   06/02/21 0206 06/02/21 0531  BP: (!) 144/99 (!) 156/92  Pulse: 75 73  Resp: 16 16  Temp: 98.6 F (37 C) 98.4 F (36.9 C)  SpO2: 97% 95%    Physical Exam:  General:  Alert, cooperative, no distress  Head:  Normocephalic, without obvious abnormality, atraumatic  Eyes:  Anicteric sclera, EOMs intact  Lungs:   Clear to auscultation bilaterally, respirations unlabored  Heart:  Regular rate and rhythm, S1, S2 normal  Abdomen:   Soft with mild distention, mild diffuse tenderness, bowel sounds active all four quadrants  Extremities: Extremities normal, atraumatic, no  edema    Lab Results: Recent Labs    06/01/21 1408 06/02/21 0407  NA 135 134*  K 4.9 4.6  CL 100 98  CO2 22 24  GLUCOSE 106* 106*  BUN 38* 46*  CREATININE 1.84* 1.89*  CALCIUM 9.3 8.9  MG 2.6* 2.6*   Recent Labs    06/01/21 1408 06/02/21 0407  AST 44* 38  ALT 42 36  ALKPHOS 68 67  BILITOT 1.1 1.6*  PROT 7.9 7.5  ALBUMIN 3.8 3.6   Recent Labs    06/01/21 1408 06/02/21 0407  WBC 15.6* 17.1*  HGB 14.9 14.4  HCT 44.9 43.9  MCV 97.2 96.9  PLT 498* 530*   Recent Labs    06/02/21 0407  LABPROT 14.6  INR 1.1     Assessment: Colonic obstruction; Colonic stricture, status post colonic stent placement on 05/26/2021 -CT 06/01/21 revealed diffuse colonic and small bowel dilatation proximal to the  sigmoid colon stent, which is likely  obstructed. -WBCs 17.1, normal Hgb -BUN 46/ Cr 1.89  Plan: Addendum: Discussed with radiology, who do not recommend proceeding with Gastrografin enema, as they do not believe that we will provide additional information or visualization.  Discussed with Dr. Watt Climes, who does not think replacement of stent would be beneficial at this time. He recommends [facility] consult for potential surgical intervention such as transverse colostomy.  Clear liquid diet.  Continue supportive care.  [facility] will follow.  Salley Slaughter PA-C 06/02/2021, 9:03 AM  Contact #  [phone]

## 2021-06-03 ENCOUNTER — Inpatient Hospital Stay (HOSPITAL_COMMUNITY): Payer: Medicare Other

## 2021-06-03 DIAGNOSIS — N19 Unspecified kidney failure: Secondary | ICD-10-CM | POA: Diagnosis not present

## 2021-06-03 DIAGNOSIS — E860 Dehydration: Secondary | ICD-10-CM | POA: Diagnosis not present

## 2021-06-03 DIAGNOSIS — D72829 Elevated white blood cell count, unspecified: Secondary | ICD-10-CM | POA: Diagnosis not present

## 2021-06-03 DIAGNOSIS — K56609 Unspecified intestinal obstruction, unspecified as to partial versus complete obstruction: Secondary | ICD-10-CM | POA: Diagnosis not present

## 2021-06-03 LAB — BASIC METABOLIC PANEL
Anion gap: 7 (ref 5–15)
BUN: 41 mg/dL — ABNORMAL HIGH (ref 8–23)
CO2: 24 mmol/L (ref 22–32)
Calcium: 8.2 mg/dL — ABNORMAL LOW (ref 8.9–10.3)
Chloride: 106 mmol/L (ref 98–111)
Creatinine, Ser: 1.73 mg/dL — ABNORMAL HIGH (ref 0.61–1.24)
GFR, Estimated: 40 mL/min — ABNORMAL LOW (ref >60)
Glucose, Bld: 137 mg/dL — ABNORMAL HIGH (ref 70–99)
Potassium: 4.2 mmol/L (ref 3.5–5.1)
Sodium: 137 mmol/L (ref 135–145)

## 2021-06-03 LAB — CBC
HCT: 39.5 % (ref 39.0–52.0)
Hemoglobin: 12.9 g/dL — ABNORMAL LOW (ref 13.0–17.0)
MCH: 32 pg (ref 26.0–34.0)
MCHC: 32.7 g/dL (ref 30.0–36.0)
MCV: 98 fL (ref 80.0–100.0)
Platelets: 454 10*3/uL — ABNORMAL HIGH (ref 150–400)
RBC: 4.03 MIL/uL — ABNORMAL LOW (ref 4.22–5.81)
RDW: 14.3 % (ref 11.5–15.5)
WBC: 9 10*3/uL (ref 4.0–10.5)
nRBC: 0 % (ref 0.0–0.2)

## 2021-06-03 LAB — MAGNESIUM: Magnesium: 2.3 mg/dL (ref 1.7–2.4)

## 2021-06-03 LAB — PHOSPHORUS: Phosphorus: 2.2 mg/dL — ABNORMAL LOW (ref 2.5–4.6)

## 2021-06-03 MED ORDER — POLYETHYLENE GLYCOL 3350 17 G PO PACK: 17.0000 g | PACK | Freq: Every day | ORAL | Status: DC

## 2021-06-03 MED ORDER — SODIUM PHOSPHATES 45 MMOLE/15ML IV SOLN
10.0000 mmol | Freq: Once | INTRAVENOUS | Status: AC
  Administered 2021-06-03: 10 mmol via INTRAVENOUS
  Filled 2021-06-03: qty 3.33

## 2021-06-03 MED ORDER — PSYLLIUM 95 % PO PACK
1.0000 | PACK | Freq: Three times a day (TID) | ORAL | Status: DC
  Administered 2021-06-03 – 2021-06-04 (×4): 1 via ORAL
  Filled 2021-06-03 (×8): qty 1

## 2021-06-03 MED ORDER — POLYETHYLENE GLYCOL 3350 17 G PO PACK
17.0000 g | PACK | Freq: Two times a day (BID) | ORAL | Status: DC
  Administered 2021-06-03 – 2021-06-04 (×2): 17 g via ORAL
  Filled 2021-06-03 (×2): qty 1

## 2021-06-03 NOTE — TOC Initial Note (Signed)
Transition of Care [facility]) - Initial/Assessment Note   Patient Details  Name: Kevin Villegas MRN: [id number] Date of Birth: Mar 25, 1941  Transition of Care [facility]) CM/SW Contact:    Sherie Don, LCSW Phone Number: 06/03/2021, 3:53 PM  Clinical Narrative: Patient is a 80 year old male who was admitted for large bowel obstruction. Readmission checklist completed due to high readmission score. CSW spoke with patient to complete assessment. Per patient, he resides at home with his wife and is independent with ADLs at baseline. Patient reported no DME and did not see a need for any DME or [place] services at this time. Patient transports himself to medical appointments. TOC to follow for possible discharge needs.  Expected Discharge Plan: Home/Self Care Barriers to Discharge: Continued Medical Work up  Patient Goals and CMS Choice Patient states their goals for this hospitalization and ongoing recovery are:: Return home to farm CMS Medicare.gov Compare Post Acute Care list provided to:: Patient Choice offered to / list presented to : NA  Expected Discharge Plan and Services Expected Discharge Plan: Home/Self Care In-house Referral: Clinical Social Work Post Acute Care Choice: NA Living arrangements for the past 2 months: Single Family Home              DME Arranged: N/A DME Agency: NA  Prior Living Arrangements/Services Living arrangements for the past 2 months: Single Family Home Lives with:: Spouse Patient language and need for interpreter reviewed:: Yes Do you feel safe going back to the place where you live?: Yes      Need for Family Participation in Patient Care: No (Comment) Care giver support system in place?: Yes (comment) Criminal Activity/Legal Involvement Pertinent to Current Situation/Hospitalization: No - Comment as needed  Activities of Daily Living Home Assistive Devices/Equipment: Eyeglasses ADL Screening (condition at time of admission) Patient's cognitive ability adequate  to safely complete daily activities?: Yes Is the patient deaf or have difficulty hearing?: Yes Does the patient have difficulty seeing, even when wearing glasses/contacts?: No Does the patient have difficulty concentrating, remembering, or making decisions?: No Patient able to express need for assistance with ADLs?: Yes Does the patient have difficulty dressing or bathing?: No Independently performs ADLs?: Yes (appropriate for developmental age) Does the patient have difficulty walking or climbing stairs?: No Weakness of Legs: None Weakness of Arms/Hands: None  Emotional Assessment Attitude/Demeanor/Rapport: Engaged Affect (typically observed): Appropriate Orientation: : Oriented to Self, Oriented to Place, Oriented to  Time, Oriented to Situation Alcohol / Substance Use: Not Applicable Psych Involvement: No (comment)  Admission diagnosis:  Large bowel obstruction ([place]) [K56.609] Abdominal pain, unspecified abdominal location [R10.9] Bowel obstruction ([place]) [K56.609] Patient Active Problem List   Diagnosis Date Noted   Dehydration 06/02/2021   Acute prerenal azotemia 06/02/2021   Leukocytosis 06/02/2021   Right ureteral stone 06/02/2021   Protein-calorie malnutrition, severe 06/02/2021   Bowel obstruction ([place]) 06/02/2021   Large bowel obstruction ([place]) 06/01/2021   Pneumatosis coli    Colon stricture ([place])    Colon obstruction ([place]) 05/18/2021   Pancreatic mass 06/27/2019   Malignant neoplasm of tail of pancreas ([place]) 06/27/2019   Rhinitis medicamentosa 02/26/2019   PCP:  Dettinger, [name], MD Pharmacy:   [facility] [address], [place] [address] [place] [address] Phone: [phone] Fax: [phone]  Readmission Risk Interventions Readmission Risk Prevention Plan 06/03/2021  Transportation Screening Complete  HRI or Home Care Consult Complete  Social Work Consult for [place] Planning/Counseling Complete  Palliative  Care  Screening Not Applicable  Medication Review (RN Care Manager) Complete  Some recent data might be hidden

## 2021-06-03 NOTE — Progress Notes (Addendum)
[facility] Gastroenterology Progress Note  Kevin Villegas 80 y.o. 1941-09-14  CC:  Colonic obstruction  Subjective: Patient had several loose stools overnight and reports resolution of abdominal pain and nausea/vomiting.  ROS : Review of Systems  Cardiovascular:  Negative for chest pain and palpitations.  Gastrointestinal:  Positive for diarrhea. Negative for abdominal pain, blood in stool, constipation, heartburn, melena, nausea and vomiting.     Objective: Vital signs in last 24 hours: Vitals:   06/02/21 2027 06/03/21 0623  BP: (!) 133/96 119/76  Pulse: 75 64  Resp: 18 18  Temp: 98.2 F (36.8 C) 97.8 F (36.6 C)  SpO2: 96% 96%    Physical Exam:  General:  Alert, cooperative, no distress  Head:  Normocephalic, without obvious abnormality, atraumatic  Eyes:  Anicteric sclera, EOMs intact  Lungs:   Clear to auscultation bilaterally, respirations unlabored  Heart:  Regular rate and rhythm, S1, S2 normal  Abdomen:   Soft, non-tender, non-distended, bowel sounds active all four quadrants  Extremities: Extremities normal, atraumatic, no  edema    Lab Results: Recent Labs    06/02/21 0407 06/03/21 0342  NA 134* 137  K 4.6 4.2  CL 98 106  CO2 24 24  GLUCOSE 106* 137*  BUN 46* 41*  CREATININE 1.89* 1.73*  CALCIUM 8.9 8.2*  MG 2.6* 2.3  PHOS  --  2.2*    Recent Labs    06/01/21 1408 06/02/21 0407  AST 44* 38  ALT 42 36  ALKPHOS 68 67  BILITOT 1.1 1.6*  PROT 7.9 7.5  ALBUMIN 3.8 3.6    Recent Labs    06/02/21 0407 06/03/21 0342  WBC 17.1* 9.0  HGB 14.4 12.9*  HCT 43.9 39.5  MCV 96.9 98.0  PLT 530* 454*    Recent Labs    06/02/21 0407  LABPROT 14.6  INR 1.1      Assessment: Colonic obstruction; Colonic stricture, status post colonic stent placement on 05/26/2021.  CT 06/01/21 revealed diffuse colonic and small bowel dilatation proximal to the  sigmoid colon stent, which is likely obstructed.  -X-ray today after multiple bowel movements showed  proximal small bowel and distal large bowel stents in place. Radiography does not show an ongoing obstructive pattern. -Leukocytosis has resolved, WBCs 9.0  -BUN 41/ Cr 1.73  Plan: Full liquid diet today.  Start Miralax daily and Metamucil BID.  Repeat plain films tomorrow morning.  Continue supportive care.  [facility] will follow.  Salley Slaughter PA-C 06/03/2021, 10:40 AM  Contact #  [phone]

## 2021-06-03 NOTE — Progress Notes (Signed)
Patient stated that he has had frequent, liquid stools overnight.

## 2021-06-03 NOTE — Consult Note (Signed)
Referral MD  Reason for Referral: Recurrent bowel obstruction; metastatic pancreatic cancer   Chief Complaint  Patient presents with   Constipation   Abdominal Pain  : I can go to the bathroom.  HPI: Kevin Villegas is well-known to me.  He is a 80 year old white male.  He has metastatic pancreatic cancer.  He was treated at the [facility] system.  He has not switched over to the [facility] system.  He has seen [place] in the office.  He has had 1 cycle of chemotherapy with gemcitabine/Abraxane.  This was about a month ago.  He recently was in the hospital because of a bowel obstruction.  He had a stent placed into the large intestine.  He was discharged about a week or so ago.  He had been doing well.  He then began to have no bowel movements.  He called his Gastroenterologist.  He took some magnesium citrate.  He had a little abdominal pain.  He then had some diarrhea.  He ultimately ended up in the emergency room.  He was found to have another bowel obstruction.  He had the CT scan done.  This was done on 06/01/2021.  He had diffuse colonic and small bowel dilation proximal to the colon stent.  There is no pneumatosis coli.  He had an unchanged appearance of a mass adjacent to the distal duodenum.  He had multiple nodular cavitary pulmonary metastasis.  His labs show white cell count of 15.6.  Hemoglobin 14.9.  Platelet count 498,000.  His sodium 135.  Potassium 4.9.  BUN 38 and creatinine 1.84.  Calcium 9.3.  His albumin is 3.8.  Total bilirubin 1.1.  He has been seen by surgery.  He is going go to surgery likely tomorrow.  The question is whether or not he will need a colostomy.  He would prefer not have a colostomy.  I would think that he is probably going to need 1 given the kind of cancer that he has.  I worry that he may have carcinomatosis.  Overall, his performance status is probably ECOG 1.    Past Medical History:  Diagnosis Date   Large bowel obstruction ([place])    Pancreatic cancer ([place])    :   Past Surgical History:  Procedure Laterality Date   ABDOMINAL SURGERY     BIOPSY  05/20/2021   Procedure: BIOPSY;  Surgeon: Otis Brace, MD;  Location: [facility];  Service: Gastroenterology;;   CHOLECYSTECTOMY N/A 12/16/2014   Procedure: LAPAROSCOPIC CHOLECYSTECTOMY WITH INTRAOPERATIVE CHOLANGIOGRAM;  Surgeon: Georganna Skeans, MD;  Location: [facility];  Service: General;  Laterality: N/A;   COLONIC STENT PLACEMENT N/A 05/26/2021   Procedure: COLONIC STENT PLACEMENT;  Surgeon: Clarene Essex, MD;  Location: [facility];  Service: Endoscopy;  Laterality: N/A;   ESOPHAGOGASTRODUODENOSCOPY N/A 05/20/2021   Procedure: ESOPHAGOGASTRODUODENOSCOPY (EGD);  Surgeon: Otis Brace, MD;  Location: [facility];  Service: Gastroenterology;  Laterality: N/A;   EUS N/A 09/24/2015   Procedure: UPPER ENDOSCOPIC ULTRASOUND (EUS) LINEAR;  Surgeon: Milus Banister, MD;  Location: [facility];  Service: Endoscopy;  Laterality: N/A;   FLEXIBLE SIGMOIDOSCOPY N/A 05/20/2021   Procedure: FLEXIBLE SIGMOIDOSCOPY;  Surgeon: Otis Brace, MD;  Location: [facility];  Service: Gastroenterology;  Laterality: N/A;   FLEXIBLE SIGMOIDOSCOPY N/A 05/26/2021   Procedure: FLEXIBLE SIGMOIDOSCOPY;  Surgeon: Clarene Essex, MD;  Location: [facility];  Service: Endoscopy;  Laterality: N/A;   VASECTOMY    :   Current Facility-Administered Medications:    (feeding supplement) PROSource Plus liquid  30 mL, 30 mL, Oral, BID BM, Pokhrel, Laxman, MD, 30 mL at 06/03/21 5170   acetaminophen (TYLENOL) tablet 650 mg, 650 mg, Oral, Q6H PRN **OR** acetaminophen (TYLENOL) suppository 650 mg, 650 mg, Rectal, Q6H PRN, Howerter, Justin B, DO   Chlorhexidine Gluconate Cloth 2 % PADS 6 each, 6 each, Topical, Daily, Howerter, Justin B, DO   Chlorhexidine Gluconate Cloth 2 % PADS 6 each, 6 each, Topical, Daily, Howerter, Justin B, DO, 6 each at 06/03/21 0855   dextrose 5 %-0.9 % sodium chloride infusion, , Intravenous, Continuous, Pokhrel,  Laxman, MD, Last Rate: 75 mL/hr at 06/03/21 1319, Rate Change at 06/03/21 1319   feeding supplement (BOOST / RESOURCE BREEZE) liquid 1 Container, 1 Container, Oral, Q24H, Pokhrel, Laxman, MD, 1 Container at 06/03/21 1309   fentaNYL (SUBLIMAZE) injection 25 mcg, 25 mcg, Intravenous, Q2H PRN, Howerter, Justin B, DO   multivitamin with minerals tablet 1 tablet, 1 tablet, Oral, Daily, Pokhrel, Laxman, MD, 1 tablet at 06/03/21 0826   ondansetron (ZOFRAN) injection 4 mg, 4 mg, Intravenous, Q6H PRN, Howerter, Justin B, DO, 4 mg at 06/02/21 0904   polyethylene glycol (MIRALAX / GLYCOLAX) packet 17 g, 17 g, Oral, BID, Saverio Danker, PA-C, 17 g at 06/03/21 1309   psyllium (HYDROCIL/METAMUCIL) 1 packet, 1 packet, Oral, TID, Ronnette Juniper, MD, 1 packet at 06/03/21 1310   sodium chloride flush (NS) 0.9 % injection 10-40 mL, 10-40 mL, Intracatheter, Q12H, Howerter, Justin B, DO, 10 mL at 06/03/21 0827   sodium chloride flush (NS) 0.9 % injection 10-40 mL, 10-40 mL, Intracatheter, PRN, Howerter, Justin B, DO:   (feeding supplement) PROSource Plus  30 mL Oral BID BM   Chlorhexidine Gluconate Cloth  6 each Topical Daily   Chlorhexidine Gluconate Cloth  6 each Topical Daily   feeding supplement  1 Container Oral Q24H   multivitamin with minerals  1 tablet Oral Daily   polyethylene glycol  17 g Oral BID   psyllium  1 packet Oral TID   sodium chloride flush  10-40 mL Intracatheter Q12H  :   Allergies  Allergen Reactions   Pollen Extract Other (See Comments)    Sinus congestion  :   Family History  Problem Relation Age of Onset   Stroke Mother   :   Social History   Socioeconomic History   Marital status: Married    Spouse name: Not on file   Number of children: 2   Years of education: Not on file   Highest education level: Not on file  Occupational History   Occupation: retired  Tobacco Use   Smoking status: Never   Smokeless tobacco: Never  Vaping Use   Vaping Use: Never used  Substance  and Sexual Activity   Alcohol use: No    Alcohol/week: 0.0 standard drinks   Drug use: No   Sexual activity: Yes  Other Topics Concern   Not on file  Social History Narrative   Not on file   Social Determinants of Health   Financial Resource Strain: Not on file  Food Insecurity: Not on file  Transportation Needs: Not on file  Physical Activity: Not on file  Stress: Not on file  Social Connections: Not on file  Intimate Partner Violence: Not on file  :  Review of Systems  Constitutional:  Positive for weight loss.  HENT: Negative.    Eyes: Negative.   Respiratory: Negative.    Cardiovascular: Negative.   Gastrointestinal:  Positive for abdominal pain.  Genitourinary: Negative.  Musculoskeletal: Negative.   Skin: Negative.   Neurological: Negative.   Endo/Heme/Allergies: Negative.   Psychiatric/Behavioral: Negative.      Exam:  Physical Exam Vitals reviewed.  HENT:     Head: Normocephalic and atraumatic.  Eyes:     Pupils: Pupils are equal, round, and reactive to light.  Cardiovascular:     Rate and Rhythm: Normal rate and regular rhythm.     Heart sounds: Normal heart sounds.  Pulmonary:     Effort: Pulmonary effort is normal.     Breath sounds: Normal breath sounds.  Abdominal:     General: Bowel sounds are normal.     Palpations: Abdomen is soft.  Musculoskeletal:        General: No tenderness or deformity. Normal range of motion.     Cervical back: Normal range of motion.  Lymphadenopathy:     Cervical: No cervical adenopathy.  Skin:    General: Skin is warm and dry.     Findings: No erythema or rash.  Neurological:     Mental Status: He is alert and oriented to person, place, and time.  Psychiatric:        Behavior: Behavior normal.        Thought Content: Thought content normal.        Judgment: Judgment normal.      Patient Vitals for the past 24 hrs:  BP Temp Temp src Pulse Resp SpO2 Weight  06/03/21 1522 (!) 143/81 97.6 F (36.4 C) Oral  60 16 100 % --  06/03/21 0623 119/76 97.8 F (36.6 C) Oral 64 18 96 % 147 lb 0.8 oz (66.7 kg)  06/02/21 2027 (!) 133/96 98.2 F (36.8 C) Oral 75 18 96 % --  06/02/21 1811 (!) 151/97 98.2 F (36.8 C) Oral 77 18 95 % --      Recent Labs    06/02/21 0407 06/03/21 0342  WBC 17.1* 9.0  HGB 14.4 12.9*  HCT 43.9 39.5  PLT 530* 454*    Recent Labs    06/02/21 0407 06/03/21 0342  NA 134* 137  K 4.6 4.2  CL 98 106  CO2 24 24  GLUCOSE 106* 137*  BUN 46* 41*  CREATININE 1.89* 1.73*  CALCIUM 8.9 8.2*    Blood smear review: None  Pathology: None    Assessment and Plan: Kevin Villegas is a very nice 80 year old white male.  He has metastatic pancreatic cancer.  Looks like he has bowel obstruction.  He has a stent placed.  The obstruction certainly is a real problem.  He will probably go to surgery on Friday to have this resolved.  It sounds like is going to have a colostomy.  Will be interesting to see what is actually found.  We really do need to get some biopsies while surgery is in their.  I will let the surgeon know that biopsies are going to be necessary so we can send off for molecular markers.  I do feel bad for him.  I know he is trying hard.  Again, the surgery will really tell us what is going on inside his abdomen.

## 2021-06-03 NOTE — Plan of Care (Signed)
  Problem: Education: Goal: Knowledge of General Education information will improve Description: Including pain rating scale, medication(s)/side effects and non-pharmacologic comfort measures Outcome: Progressing   Problem: Health Behavior/Discharge Planning: Goal: Ability to manage health-related needs will improve Outcome: Progressing   Problem: Elimination: Goal: Will not experience complications related to bowel motility Outcome: Progressing   

## 2021-06-03 NOTE — Progress Notes (Signed)
PROGRESS NOTE  Kevin Villegas TIW:[id number] DOB: 04-Nov-1941 DOA: 06/01/2021 PCP: Dettinger, [name], MD   LOS: 1 day   Brief narrative:  Kevin Villegas is a 80 y.o. male with medical history significant for pancreatic cancer, large bowel obstruction, colonic stricture status post recent placement of sigmoid colonic stent was admitted hospital for abdominal pain and constipation nausea and vomiting.  He contacted his outpatient gastroenterologist and was admitted to the hospital subsequently for bowel obstruction.  In the ED, vitals were stable.  CBC was elevated at 15,600.  Patient was then admitted hospital for  large bowel obstruction in the setting of suspected obstructive sigmoid colonic stent.     Assessment/Plan:  Principal Problem:   Large bowel obstruction (HCC) Active Problems:   Dehydration   Acute prerenal azotemia   Leukocytosis   Right ureteral stone   Protein-calorie malnutrition, severe   Bowel obstruction (HCC)   Large bowel obstruction:  History of sigmoid colonic stent placement on 05/26/2021, CT scan this time showing obstruction.  GI and general surgery on board.  Continue supportive care including IV fluids antiemetics and other supportive care.   He did have multiple bowel movements yesterday.  Wishes to be advanced on full liquids.  Spoke with general surgery about it.  Spoke with the surgical team at bedside.  We will follow surgical recommendations.  Volume depletion/dehydration:  Continue IV fluid hydration.  Improved at this time.    Right ureteral stone:  Unchanged from prior with mild to moderate right hydronephrosis.  Urological intervention has been delayed in the setting of  colonic stent placement. Continue IV fluid hydration.  Creatinine of 1.7    Leukocytosis: Likely reactive in nature.  No fever.  WBC count of 9.0  Severe Protein calorie malnutrition.  Present on admission.  Dietitian on board.  Will follow recommendations.  Will advance to full  liquids.  Hypophosphatemia.  We will replace.  Check levels in a.m.    DVT prophylaxis: SCDs Start: 06/01/21 2102   Code Status: Full code  Family Communication: None  Status is: Observation  The patient will require care spanning > 2 midnights and should be moved to inpatient because: IV treatments appropriate due to intensity of illness or inability to take PO and Inpatient level of care appropriate due to severity of illness  Dispo: The patient is from: Home              Anticipated d/c is to: Home              Patient currently is not medically stable to d/c.   Difficult to place patient No  Consultants: GI  Procedures: None so far  Anti-infectives:  None  Anti-infectives (From admission, onward)    None        Subjective: Today, patient was seen and examined at bedside.  States that that he has had multiple episodes of loose watery stools yesterday.  Denies any nausea or abdominal pain.  Objective: Vitals:   06/02/21 2027 06/03/21 0623  BP: (!) 133/96 119/76  Pulse: 75 64  Resp: 18 18  Temp: 98.2 F (36.8 C) 97.8 F (36.6 C)  SpO2: 96% 96%    Intake/Output Summary (Last 24 hours) at 06/03/2021 0952 Last data filed at 06/03/2021 0700 Gross per 24 hour  Intake 2454.11 ml  Output --  Net 2505.11 ml    Filed Weights   06/01/21 2341 06/02/21 0500 06/03/21 0623  Weight: 69.2 kg 68.8 kg 66.7 kg   Body  mass index is 20.51 kg/m.   Physical Exam:  GENERAL: Patient is alert awake and oriented. Not in obvious distress. HENT: No scleral pallor or icterus. Pupils equally reactive to light. Oral mucosa is moist NECK: is supple, no gross swelling noted. CHEST: Clear to auscultation. No crackles or wheezes.  Diminished breath sounds bilaterally. CVS: S1 and S2 heard, no murmur. Regular rate and rhythm.  ABDOMEN: Soft nontender bowel sounds are present.   EXTREMITIES: No edema. CNS: Cranial nerves are intact. No focal motor deficits. SKIN: warm and dry  without rashes.  Data Review: I have personally reviewed the following laboratory data and studies,  CBC: Recent Labs  Lab 06/01/21 1408 06/02/21 0407 06/03/21 0342  WBC 15.6* 17.1* 9.0  HGB 14.9 14.4 12.9*  HCT 44.9 43.9 39.5  MCV 97.2 96.9 98.0  PLT 498* 530* 454*    Basic Metabolic Panel: Recent Labs  Lab 06/01/21 1408 06/02/21 0407 06/03/21 0342  NA 135 134* 137  K 4.9 4.6 4.2  CL 100 98 106  CO2 22 24 24   GLUCOSE 106* 106* 137*  BUN 38* 46* 41*  CREATININE 1.84* 1.89* 1.73*  CALCIUM 9.3 8.9 8.2*  MG 2.6* 2.6* 2.3  PHOS  --   --  2.2*    Liver Function Tests: Recent Labs  Lab 06/01/21 1408 06/02/21 0407  AST 44* 38  ALT 42 36  ALKPHOS 68 67  BILITOT 1.1 1.6*  PROT 7.9 7.5  ALBUMIN 3.8 3.6    Recent Labs  Lab 06/01/21 1408  LIPASE 27    No results for input(s): AMMONIA in the last 168 hours. Cardiac Enzymes: No results for input(s): CKTOTAL, CKMB, CKMBINDEX, TROPONINI in the last 168 hours. BNP (last 3 results) No results for input(s): BNP in the last 8760 hours.  ProBNP (last 3 results) No results for input(s): PROBNP in the last 8760 hours.  CBG: No results for input(s): GLUCAP in the last 168 hours. Recent Results (from the past 240 hour(s))  Resp Panel by RT-PCR (Flu A&B, Covid) Nasopharyngeal Swab     Status: None   Collection Time: 06/01/21  8:35 PM   Specimen: Nasopharyngeal Swab; Nasopharyngeal(NP) swabs in vial transport medium  Result Value Ref Range Status   SARS Coronavirus 2 by RT PCR NEGATIVE NEGATIVE Final    Comment: (NOTE) SARS-CoV-2 target nucleic acids are NOT DETECTED.  The SARS-CoV-2 RNA is generally detectable in upper respiratory specimens during the acute phase of infection. The lowest concentration of SARS-CoV-2 viral copies this assay can detect is 138 copies/mL. A negative result does not preclude SARS-Cov-2 infection and should not be used as the sole basis for treatment or other patient management  decisions. A negative result may occur with  improper specimen collection/handling, submission of specimen other than nasopharyngeal swab, presence of viral mutation(s) within the areas targeted by this assay, and inadequate number of viral copies(<138 copies/mL). A negative result must be combined with clinical observations, patient history, and epidemiological information. The expected result is Negative.  Fact Sheet for Patients:  EntrepreneurPulse.com.au  Fact Sheet for Healthcare Providers:  IncredibleEmployment.be  This test is no t yet approved or cleared by the [place] FDA and  has been authorized for detection and/or diagnosis of SARS-CoV-2 by FDA under an Emergency Use Authorization (EUA). This EUA will remain  in effect (meaning this test can be used) for the duration of the COVID-19 declaration under Section 564(b)(1) of the Act, 21 U.S.C.section 360bbb-3(b)(1), unless the authorization is terminated  or revoked sooner.       Influenza A by PCR NEGATIVE NEGATIVE Final   Influenza B by PCR NEGATIVE NEGATIVE Final    Comment: (NOTE) The Xpert Xpress SARS-CoV-2/FLU/RSV plus assay is intended as an aid in the diagnosis of influenza from Nasopharyngeal swab specimens and should not be used as a sole basis for treatment. Nasal washings and aspirates are unacceptable for Xpert Xpress SARS-CoV-2/FLU/RSV testing.  Fact Sheet for Patients: EntrepreneurPulse.com.au  Fact Sheet for Healthcare Providers: IncredibleEmployment.be  This test is not yet approved or cleared by the [place] FDA and has been authorized for detection and/or diagnosis of SARS-CoV-2 by FDA under an Emergency Use Authorization (EUA). This EUA will remain in effect (meaning this test can be used) for the duration of the COVID-19 declaration under Section 564(b)(1) of the Act, 21 U.S.C. section 360bbb-3(b)(1), unless the  authorization is terminated or revoked.  Performed at [facility], [place] [address], [place], [place] [address]       Studies: CT Abdomen Pelvis Wo Contrast  Result Date: 06/01/2021 CLINICAL DATA:  Abdominal distension EXAM: CT ABDOMEN AND PELVIS WITHOUT CONTRAST TECHNIQUE: Multidetector CT imaging of the abdomen and pelvis was performed following the standard protocol without IV contrast. COMPARISON:  05/18/2021 FINDINGS: LOWER CHEST: Multiple nodular and cavitary lesions of the lung bases, unchanged. Unchanged size of right lower lobe index nodule (6:4) measuring 15 x 15 mm. HEPATOBILIARY: Normal hepatic contours. No intra- or extrahepatic biliary dilatation. Unchanged hepatic cysts. Status post cholecystectomy. PANCREAS: Postsurgical changes. SPLEEN: Surgically absent. ADRENALS/URINARY TRACT: The adrenal glands are normal. Moderate right hydroureteronephrosis is unchanged, due to unchanged 8 mm distal right ureteral calculus. No left hydronephrosis. The urinary bladder is normal for degree of distention STOMACH/BOWEL: There are stents in the sigmoid colon and distal duodenum. There is diffuse colonic and small bowel dilatation proximal to the sigmoid colon stent. Previously present pneumatosis coli has resolved. Previously described mass adjacent to the distal duodenum is poorly characterized in the absence of intravenous contrast. However, it appears to be unchanged, measuring 2.9 x 2.5 cm. VASCULAR/LYMPHATIC: There is calcific atherosclerosis of the abdominal aorta. No lymphadenopathy. REPRODUCTIVE: Enlarged prostate measures 5.6 cm in transverse dimension. MUSCULOSKELETAL. Multilevel degenerative disc disease and facet arthrosis. No bony spinal canal stenosis. OTHER: None. IMPRESSION: 1. Diffuse colonic and small bowel dilatation proximal to the sigmoid colon stent, which is likely obstructed. Previously present pneumatosis coli has resolved. 2. Unchanged appearance of 8 mm distal  right ureteral calculus with moderate right hydroureteronephrosis. 3. Unchanged appearance of multiple nodular and cavitary metastases of the lung bases. 4. Unchanged appearance of mass adjacent to the distal duodenum. Aortic Atherosclerosis (ICD10-I70.0). Electronically Signed   By: Ulyses Jarred M.D.   On: 06/01/2021 19:44      Flora Lipps, MD  [facility] 06/03/2021  If 7PM-7AM, please contact night-coverage

## 2021-06-03 NOTE — Progress Notes (Signed)
Subjective: Patient is overall very frustrated by his situation.  He had multiple episodes of diarrhea last night with no further abdominal distention, N/V, abdominal pain, etc this morning.    ROS: See above, otherwise other systems negative  Objective: Vital signs in last 24 hours: Temp:  [97.8 F (36.6 C)-98.2 F (36.8 C)] 97.8 F (36.6 C) (06/16 0623) Pulse Rate:  [64-77] 64 (06/16 0623) Resp:  [18] 18 (06/16 0623) BP: (119-151)/(76-97) 119/76 (06/16 0623) SpO2:  [95 %-98 %] 96 % (06/16 0623) Weight:  [66.7 kg] 66.7 kg (06/16 0623) Last BM Date: 06/03/21  Intake/Output from previous day: 06/15 0701 - 06/16 0700 In: 2454.1 [I.V.:2454.1] Out: -  Intake/Output this shift: Total I/O In: 300 [I.V.:300] Out: -   PE: Abd: soft, NT, ND, +BS  Lab Results:  Recent Labs    06/02/21 0407 06/03/21 0342  WBC 17.1* 9.0  HGB 14.4 12.9*  HCT 43.9 39.5  PLT 530* 454*   BMET Recent Labs    06/02/21 0407 06/03/21 0342  NA 134* 137  K 4.6 4.2  CL 98 106  CO2 24 24  GLUCOSE 106* 137*  BUN 46* 41*  CREATININE 1.89* 1.73*  CALCIUM 8.9 8.2*   PT/INR Recent Labs    06/02/21 0407  LABPROT 14.6  INR 1.1   CMP     Component Value Date/Time   NA 137 06/03/2021 0342   NA 141 03/22/2017 1101   K 4.2 06/03/2021 0342   CL 106 06/03/2021 0342   CO2 24 06/03/2021 0342   GLUCOSE 137 (H) 06/03/2021 0342   BUN 41 (H) 06/03/2021 0342   BUN 17 03/22/2017 1101   CREATININE 1.73 (H) 06/03/2021 0342   CREATININE 1.71 (H) 04/28/2021 1032   CALCIUM 8.2 (L) 06/03/2021 0342   PROT 7.5 06/02/2021 0407   PROT 6.9 03/22/2017 1101   ALBUMIN 3.6 06/02/2021 0407   ALBUMIN 4.5 03/22/2017 1101   AST 38 06/02/2021 0407   AST 27 04/28/2021 1032   ALT 36 06/02/2021 0407   ALT 18 04/28/2021 1032   ALKPHOS 67 06/02/2021 0407   BILITOT 1.6 (H) 06/02/2021 0407   BILITOT 0.5 04/28/2021 1032   GFRNONAA 40 (L) 06/03/2021 0342   GFRNONAA 40 (L) 04/28/2021 1032   GFRAA >60  11/24/2019 1600   Lipase     Component Value Date/Time   LIPASE 27 06/01/2021 1408       Studies/Results: CT Abdomen Pelvis Wo Contrast  Result Date: 06/01/2021 CLINICAL DATA:  Abdominal distension EXAM: CT ABDOMEN AND PELVIS WITHOUT CONTRAST TECHNIQUE: Multidetector CT imaging of the abdomen and pelvis was performed following the standard protocol without IV contrast. COMPARISON:  05/18/2021 FINDINGS: LOWER CHEST: Multiple nodular and cavitary lesions of the lung bases, unchanged. Unchanged size of right lower lobe index nodule (6:4) measuring 15 x 15 mm. HEPATOBILIARY: Normal hepatic contours. No intra- or extrahepatic biliary dilatation. Unchanged hepatic cysts. Status post cholecystectomy. PANCREAS: Postsurgical changes. SPLEEN: Surgically absent. ADRENALS/URINARY TRACT: The adrenal glands are normal. Moderate right hydroureteronephrosis is unchanged, due to unchanged 8 mm distal right ureteral calculus. No left hydronephrosis. The urinary bladder is normal for degree of distention STOMACH/BOWEL: There are stents in the sigmoid colon and distal duodenum. There is diffuse colonic and small bowel dilatation proximal to the sigmoid colon stent. Previously present pneumatosis coli has resolved. Previously described mass adjacent to the distal duodenum is poorly characterized in the absence of intravenous contrast. However, it appears to be unchanged, measuring  2.9 x 2.5 cm. VASCULAR/LYMPHATIC: There is calcific atherosclerosis of the abdominal aorta. No lymphadenopathy. REPRODUCTIVE: Enlarged prostate measures 5.6 cm in transverse dimension. MUSCULOSKELETAL. Multilevel degenerative disc disease and facet arthrosis. No bony spinal canal stenosis. OTHER: None. IMPRESSION: 1. Diffuse colonic and small bowel dilatation proximal to the sigmoid colon stent, which is likely obstructed. Previously present pneumatosis coli has resolved. 2. Unchanged appearance of 8 mm distal right ureteral calculus with  moderate right hydroureteronephrosis. 3. Unchanged appearance of multiple nodular and cavitary metastases of the lung bases. 4. Unchanged appearance of mass adjacent to the distal duodenum. Aortic Atherosclerosis (ICD10-I70.0). Electronically Signed   By: Ulyses Jarred M.D.   On: 06/01/2021 19:44   DG Abd 1 View  Result Date: 06/03/2021 CLINICAL DATA:  Colonic obstruction.  Colon stent in place. EXAM: ABDOMEN - 1 VIEW COMPARISON:  CT 2 days ago FINDINGS: No obstructive bowel gas pattern presently. Intraluminal stents in the distal sigmoid colon and in the ligament of Treitz region of the proximal small intestine. Previous cholecystectomy clips also noted. Previous gastric sutures. IMPRESSION: Proximal small bowel and distal large bowel stents in place. Radiography does not show an ongoing obstructive pattern. Electronically Signed   By: Nelson Chimes M.D.   On: 06/03/2021 09:50    Anti-infectives: Anti-infectives (From admission, onward)    None        Assessment/Plan  Stricture/stenosis of sigmoid colon, benign -patient having multiple BMs today and is no longer appearing obstructed. -we had a long conversation about possible options.  Given he has started to move things through, we could resume his miralax and try to slowly advance his diet and see how he tolerates this and if he fails, then we can discuss surgery.  The other option being we go ahead and proceed with a diverting colostomy.  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  He was under the impression that we could just resect this area and hook everything back together.  We discussed that due to the malignancy in his abdomen that may be very difficult and if he is having obstructive symptoms, that is not the safe way to proceed.  He was clearly not very  happy with either of these two options.  See below's discussion as patient very frustrated when I spoke about his pancreatic cancer as he feels that this isn't even a correct diagnosis. -ok with FLD and miralax BID. -Dr. Ninfa Linden to speak to him regarding how to proceed.   Hx metastatic pancreatic cancer  - s/p distal pancreatectomy, splenectomy, and partial gastrectomy in 10/2017  - CT scan shows progressive metastatic disease with new/enlarging pulmonary nodules and enlarging mesenteric soft tissue mass at the duodenal jejunal junction  - recently started chemo for recurrent cancer, Dr. Marin Olp following; however patient seems to be somewhat in denial that he actually has ever had pancreatic cancer as he continues to tell me that no one has ever been able to get pathology to confirm this etc; however, this is documented.  FEN - FLD, miralax BID VTE - ok for prophylaxis from our standpoint ID - none needed   LOS: 1 day    Henreitta Cea , [facility] 06/03/2021, 11:02 AM Please see Amion for pager number during  day hours 7:00am-4:30pm or 7:00am -11:30am on weekends

## 2021-06-04 ENCOUNTER — Inpatient Hospital Stay (HOSPITAL_COMMUNITY): Payer: Medicare Other

## 2021-06-04 DIAGNOSIS — K56609 Unspecified intestinal obstruction, unspecified as to partial versus complete obstruction: Secondary | ICD-10-CM | POA: Diagnosis not present

## 2021-06-04 LAB — BASIC METABOLIC PANEL
Anion gap: 6 (ref 5–15)
BUN: 24 mg/dL — ABNORMAL HIGH (ref 8–23)
CO2: 23 mmol/L (ref 22–32)
Calcium: 8.1 mg/dL — ABNORMAL LOW (ref 8.9–10.3)
Chloride: 109 mmol/L (ref 98–111)
Creatinine, Ser: 1.45 mg/dL — ABNORMAL HIGH (ref 0.61–1.24)
GFR, Estimated: 49 mL/min — ABNORMAL LOW (ref >60)
Glucose, Bld: 121 mg/dL — ABNORMAL HIGH (ref 70–99)
Potassium: 3.7 mmol/L (ref 3.5–5.1)
Sodium: 138 mmol/L (ref 135–145)

## 2021-06-04 LAB — CBC
HCT: 38.2 % — ABNORMAL LOW (ref 39.0–52.0)
Hemoglobin: 12.4 g/dL — ABNORMAL LOW (ref 13.0–17.0)
MCH: 32 pg (ref 26.0–34.0)
MCHC: 32.5 g/dL (ref 30.0–36.0)
MCV: 98.5 fL (ref 80.0–100.0)
Platelets: 446 10*3/uL — ABNORMAL HIGH (ref 150–400)
RBC: 3.88 MIL/uL — ABNORMAL LOW (ref 4.22–5.81)
RDW: 14.5 % (ref 11.5–15.5)
WBC: 8.2 10*3/uL (ref 4.0–10.5)
nRBC: 0 % (ref 0.0–0.2)

## 2021-06-04 LAB — PHOSPHORUS: Phosphorus: 2.4 mg/dL — ABNORMAL LOW (ref 2.5–4.6)

## 2021-06-04 LAB — MAGNESIUM: Magnesium: 1.9 mg/dL (ref 1.7–2.4)

## 2021-06-04 MED ORDER — POLYETHYLENE GLYCOL 3350 17 G PO PACK
17.0000 g | PACK | Freq: Three times a day (TID) | ORAL | Status: DC
  Administered 2021-06-04 – 2021-06-05 (×3): 17 g via ORAL
  Filled 2021-06-04 (×3): qty 1

## 2021-06-04 NOTE — Progress Notes (Signed)
Now, looks like there is currently no surgery.  It seems as if he is having bowel movements.  His exam is improved.  He had an abdominal series yesterday which did not show any obstruction.  Surgery would just like to follow him now.  I just wonder if he needs another sigmoidoscopy to look at the stent to see how patent this is.  I suppose his diet is going be gradually increased.  His labs today show white cell count of 8.2.  Hemoglobin 12.4.  Platelet count 446,000.  His BUN is 24 creatinine 1.45.  He is happy that he is not going to need surgery although he is little bit worried that this obstruction is going to happen again.  He has had no fever.  There has been no cough.  He has had no bleeding.  There has been no vomiting.  His vital signs show temperature 97.9.  Pulse 60.  Blood pressure 120/66.  His oxygen saturation is 100%.  His abdomen is soft.  Bowel sounds are better.  There is no guarding or rebound  tenderness.  There is no abdominal mass.  There is no fluid wave.  Cardiac exam regular rate and rhythm with no murmurs.  Lungs are clear.  Hopefully, he will be able to go home soon now.  Hopefully this was just a isolated episode with respect to the obstruction.  I do appreciate the great care that he is got from all the staff up on [address].  Lattie Haw, MD  [facility] 1:37

## 2021-06-04 NOTE — Progress Notes (Signed)
Patient had one liquid stool this shift.

## 2021-06-04 NOTE — Progress Notes (Signed)
PROGRESS NOTE  Kevin Villegas TGG:[id number] DOB: 12-24-1940 DOA: 06/01/2021 PCP: Dettinger, [name], MD   LOS: 2 days   Brief narrative:  Kevin Villegas is a 80 y.o. male with medical history significant for pancreatic cancer, large bowel obstruction, colonic stricture status post recent placement of sigmoid colonic stent was admitted hospital for abdominal pain and constipation nausea and vomiting.  He contacted his outpatient gastroenterologist and was admitted to the hospital subsequently for bowel obstruction.  In the ED, vitals were stable.  CBC was elevated at 15,600.  Patient was then admitted hospital for  large bowel obstruction in the setting of suspected obstructive sigmoid colonic stent.     Assessment/Plan:  Principal Problem:   Large bowel obstruction (HCC) Active Problems:   Dehydration   Acute prerenal azotemia   Leukocytosis   Right ureteral stone   Protein-calorie malnutrition, severe   Bowel obstruction (HCC)   Large bowel obstruction:  History of sigmoid colonic stent placement on 05/26/2021, CT scan on presentation showing obstruction.   GI and general surgery on board.  Having BM , no plan for surgery , diet advanced to soft diet , will DC IV hydration  GI plan to repeat imaging tomorrow  Appreciate GI and general surgery input  AKI on CKD 3A likely due to volume depletion/dehydration:  Received IV fluid hydration.  Now diet advanced, DC IV hydration, encourage oral intake. BUN 38 creatinine 1.84 on presentation BUN 24, creatinine 1.45 today Renal dosing medication    Right ureteral stone:  Unchanged from prior with mild to moderate right hydronephrosis.  Urological intervention has been delayed in the setting of  colonic stent placement.  He received  IV fluid hydration.  Denies flank pain Creatinine of 1.45    Leukocytosis: Likely reactive and dehydration in nature.  No fever.  Leukocytosis resolved  Severe Protein calorie malnutrition.  Present on  admission.  Dietitian on board.  Will follow recommendations.   Currently on soft diet .  Hypophosphatemia.  Replaced, improved    DVT prophylaxis: SCDs Start: 06/01/21 2102   Code Status: Full code  Family Communication: None  Status is: Inpatient  The patient will require care spanning > 2 midnights and should be moved to inpatient because: IV treatments appropriate due to intensity of illness or inability to take PO and Inpatient level of care appropriate due to severity of illness  Dispo: The patient is from: Home              Anticipated d/c is to: Home if clears by GI              Patient currently is not medically stable to d/c.   Difficult to place patient No  Consultants: GI General surgery Oncology  Procedures: None so far  Anti-infectives:  None  Anti-infectives (From admission, onward)    None        Subjective: Today, patient was seen and examined at bedside.   Denies pain, start to eat soft diet, no n/v, reports had bm He is Excited about the possibility of going home tomorrow, states he needs to take care of his horses at home [facility] in hallway this morning, denies dizziness, gait is steady   Objective: Vitals:   06/03/21 2054 06/04/21 0511  BP: (!) 150/93 120/66  Pulse: 61 60  Resp: 16 16  Temp: 98.4 F (36.9 C) 97.9 F (36.6 C)  SpO2: 96% 100%    Intake/Output Summary (Last 24 hours) at 06/04/2021 1248 Last data  filed at 06/04/2021 0900 Gross per 24 hour  Intake 1243.94 ml  Output --  Net 1243.94 ml   Filed Weights   06/02/21 0500 06/03/21 0623 06/04/21 0511  Weight: 68.8 kg 66.7 kg 66.9 kg   Body mass index is 20.57 kg/m.   Physical Exam:  GENERAL: Patient is alert awake and oriented. Not in obvious distress. HENT: No scleral pallor or icterus. Pupils equally reactive to light. Oral mucosa is moist NECK: is supple, no gross swelling noted. CHEST: Clear to auscultation. No crackles or wheezes.  Diminished breath sounds  bilaterally. CVS: S1 and S2 heard, no murmur. Regular rate and rhythm.  ABDOMEN: Soft nontender bowel sounds are present.   EXTREMITIES: No edema. CNS: Cranial nerves are intact. No focal motor deficits. SKIN: warm and dry without rashes.  Data Review: I have personally reviewed the following laboratory data and studies,  CBC: Recent Labs  Lab 06/01/21 1408 06/02/21 0407 06/03/21 0342 06/04/21 0458  WBC 15.6* 17.1* 9.0 8.2  HGB 14.9 14.4 12.9* 12.4*  HCT 44.9 43.9 39.5 38.2*  MCV 97.2 96.9 98.0 98.5  PLT 498* 530* 454* 161*   Basic Metabolic Panel: Recent Labs  Lab 06/01/21 1408 06/02/21 0407 06/03/21 0342 06/04/21 0458  NA 135 134* 137 138  K 4.9 4.6 4.2 3.7  CL 100 98 106 109  CO2 22 24 24 23   GLUCOSE 106* 106* 137* 121*  BUN 38* 46* 41* 24*  CREATININE 1.84* 1.89* 1.73* 1.45*  CALCIUM 9.3 8.9 8.2* 8.1*  MG 2.6* 2.6* 2.3 1.9  PHOS  --   --  2.2* 2.4*   Liver Function Tests: Recent Labs  Lab 06/01/21 1408 06/02/21 0407  AST 44* 38  ALT 42 36  ALKPHOS 68 67  BILITOT 1.1 1.6*  PROT 7.9 7.5  ALBUMIN 3.8 3.6   Recent Labs  Lab 06/01/21 1408  LIPASE 27   No results for input(s): AMMONIA in the last 168 hours. Cardiac Enzymes: No results for input(s): CKTOTAL, CKMB, CKMBINDEX, TROPONINI in the last 168 hours. BNP (last 3 results) No results for input(s): BNP in the last 8760 hours.  ProBNP (last 3 results) No results for input(s): PROBNP in the last 8760 hours.  CBG: No results for input(s): GLUCAP in the last 168 hours. Recent Results (from the past 240 hour(s))  Resp Panel by RT-PCR (Flu A&B, Covid) Nasopharyngeal Swab     Status: None   Collection Time: 06/01/21  8:35 PM   Specimen: Nasopharyngeal Swab; Nasopharyngeal(NP) swabs in vial transport medium  Result Value Ref Range Status   SARS Coronavirus 2 by RT PCR NEGATIVE NEGATIVE Final    Comment: (NOTE) SARS-CoV-2 target nucleic acids are NOT DETECTED.  The SARS-CoV-2 RNA is generally  detectable in upper respiratory specimens during the acute phase of infection. The lowest concentration of SARS-CoV-2 viral copies this assay can detect is 138 copies/mL. A negative result does not preclude SARS-Cov-2 infection and should not be used as the sole basis for treatment or other patient management decisions. A negative result may occur with  improper specimen collection/handling, submission of specimen other than nasopharyngeal swab, presence of viral mutation(s) within the areas targeted by this assay, and inadequate number of viral copies(<138 copies/mL). A negative result must be combined with clinical observations, patient history, and epidemiological information. The expected result is Negative.  Fact Sheet for Patients:  EntrepreneurPulse.com.au  Fact Sheet for Healthcare Providers:  IncredibleEmployment.be  This test is no t yet approved or cleared by the  [place] Architectural technologist and  has been authorized for detection and/or diagnosis of SARS-CoV-2 by FDA under an Print production planner (EUA). This EUA will remain  in effect (meaning this test can be used) for the duration of the COVID-19 declaration under Section 564(b)(1) of the Act, 21 U.S.C.section 360bbb-3(b)(1), unless the authorization is terminated  or revoked sooner.       Influenza A by PCR NEGATIVE NEGATIVE Final   Influenza B by PCR NEGATIVE NEGATIVE Final    Comment: (NOTE) The Xpert Xpress SARS-CoV-2/FLU/RSV plus assay is intended as an aid in the diagnosis of influenza from Nasopharyngeal swab specimens and should not be used as a sole basis for treatment. Nasal washings and aspirates are unacceptable for Xpert Xpress SARS-CoV-2/FLU/RSV testing.  Fact Sheet for Patients: EntrepreneurPulse.com.au  Fact Sheet for Healthcare Providers: IncredibleEmployment.be  This test is not yet approved or cleared by the [place] FDA  and has been authorized for detection and/or diagnosis of SARS-CoV-2 by FDA under an Emergency Use Authorization (EUA). This EUA will remain in effect (meaning this test can be used) for the duration of the COVID-19 declaration under Section 564(b)(1) of the Act, 21 U.S.C. section 360bbb-3(b)(1), unless the authorization is terminated or revoked.  Performed at [facility], [place] [address] [place], [place] [address]      Studies: DG Abd 1 View  Result Date: 06/04/2021 CLINICAL DATA:  80 year old male with colonic obstruction. History of pancreatic cancer status post distal pancreatectomy, distal duodenal stent, sigmoid colon stent. EXAM: ABDOMEN - 1 VIEW COMPARISON:  KUB 06/03/2021. CT Abdomen and Pelvis 06/01/2021 and earlier. FINDINGS: Portable AP supine view at 0759 hours. Stable configuration of the left upper quadrant distal duodenum and pelvic sigmoid colon stents. Bowel-gas pattern has mildly improved since 06/01/2021, but gas distended proximal colon persists. No definite pneumoperitoneum on this supine view. Stable visible lung bases. Stable cholecystectomy clips. Stable visualized osseous structures. IMPRESSION: Mildly improved bowel gas pattern since the CT on 06/01/2021, although a degree of partial large bowel obstruction may persist. Electronically Signed   By: Genevie Ann M.D.   On: 06/04/2021 08:38   DG Abd 1 View  Result Date: 06/03/2021 CLINICAL DATA:  Colonic obstruction.  Colon stent in place. EXAM: ABDOMEN - 1 VIEW COMPARISON:  CT 2 days ago FINDINGS: No obstructive bowel gas pattern presently. Intraluminal stents in the distal sigmoid colon and in the ligament of Treitz region of the proximal small intestine. Previous cholecystectomy clips also noted. Previous gastric sutures. IMPRESSION: Proximal small bowel and distal large bowel stents in place. Radiography does not show an ongoing obstructive pattern. Electronically Signed   By: Nelson Chimes M.D.   On:  06/03/2021 09:50      Florencia Reasons, MD PhD FACP [facility] 06/04/2021  If 7PM-7AM, please contact night-coverage

## 2021-06-04 NOTE — Progress Notes (Signed)
[facility] Gastroenterology Progress Note  Kevin Villegas 80 y.o. [birth year]  CC:  Colonic obstruction  Subjective: Patient denies abdominal pain and nausea/vomiting.  Had a small loose BM yesterday but no further bowel movements. Tolerated full liquids (yogurt,cream of wheat).  ROS : Review of Systems  Cardiovascular:  Negative for chest pain and palpitations.  Gastrointestinal:  Negative for abdominal pain, blood in stool, constipation, diarrhea, heartburn, melena, nausea and vomiting.     Objective: Vital signs in last 24 hours: Vitals:   06/03/21 2054 06/04/21 0511  BP: (!) 150/93 120/66  Pulse: 61 60  Resp: 16 16  Temp: 98.4 F (36.9 C) 97.9 F (36.6 C)  SpO2: 96% 100%    Physical Exam:  General:  Alert, cooperative, no distress  Head:  Normocephalic, without obvious abnormality, atraumatic  Eyes:  Anicteric sclera, EOMs intact  Lungs:   Clear to auscultation bilaterally, respirations unlabored  Heart:  Regular rate and rhythm, S1, S2 normal  Abdomen:   Soft, non-tender, non-distended, bowel sounds active all four quadrants  Extremities: Extremities normal, atraumatic, no  edema    Lab Results: Recent Labs    06/03/21 0342 06/04/21 0458  NA 137 138  K 4.2 3.7  CL 106 109  CO2 24 23  GLUCOSE 137* 121*  BUN 41* 24*  CREATININE 1.73* 1.45*  CALCIUM 8.2* 8.1*  MG 2.3 1.9  PHOS 2.2* 2.4*    Recent Labs    06/01/21 1408 06/02/21 0407  AST 44* 38  ALT 42 36  ALKPHOS 68 67  BILITOT 1.1 1.6*  PROT 7.9 7.5  ALBUMIN 3.8 3.6    Recent Labs    06/03/21 0342 06/04/21 0458  WBC 9.0 8.2  HGB 12.9* 12.4*  HCT 39.5 38.2*  MCV 98.0 98.5  PLT 454* 446*    Recent Labs    06/02/21 0407  LABPROT 14.6  INR 1.1      Assessment: Colonic obstruction, clinically improving. Colonic stricture, status post colonic stent placement on 05/26/2021.  CT 06/01/21 revealed diffuse colonic and small bowel dilatation proximal to the  sigmoid colon stent, which is likely  obstructed.  -X-ray today showed mildly improved bowel gas pattern since the CT on 06/01/2021, although a degree of partial large bowel obstruction may persist. -Leukocytosis has resolved, WBCs 8.2 -BUN 24/ Cr 1.45  Plan: Trial of soft diet.  Increase Miralax to TID dosing.  Continue Metamucil TID.  Repeat plain films tomorrow morning.  Possible discharge tomorrow pending clinical status and repeat Xray.  Continue supportive care.  [facility] will follow.  Salley Slaughter PA-C 06/04/2021, 10:21 AM  Contact #  [phone]

## 2021-06-04 NOTE — Progress Notes (Signed)
Subjective: CC: Patient reports he continues to have BM's. Reports he is passing flatus and had 4 semi-solid BM's yesterday. No abdominal pain, n/v. Tolerating FLD.   Objective: Vital signs in last 24 hours: Temp:  [97.6 F (36.4 C)-98.4 F (36.9 C)] 97.9 F (36.6 C) (06/17 0511) Pulse Rate:  [60-61] 60 (06/17 0511) Resp:  [16] 16 (06/17 0511) BP: (120-150)/(66-93) 120/66 (06/17 0511) SpO2:  [96 %-100 %] 100 % (06/17 0511) Weight:  [66.9 kg] 66.9 kg (06/17 0511) Last BM Date: 06/03/21  Intake/Output from previous day: 06/16 0701 - 06/17 0700 In: 621 [P.O.:200; I.V.:356.1; IV Piggyback:267.8] Out: -  Intake/Output this shift: Total I/O In: 720 [P.O.:720] Out: -   PE: Gen: Awake and alert, NAD Lungs: Normal rate and effort  Abd: Soft, ND, NT, +BS  Lab Results:  Recent Labs    06/03/21 0342 06/04/21 0458  WBC 9.0 8.2  HGB 12.9* 12.4*  HCT 39.5 38.2*  PLT 454* 446*   BMET Recent Labs    06/03/21 0342 06/04/21 0458  NA 137 138  K 4.2 3.7  CL 106 109  CO2 24 23  GLUCOSE 137* 121*  BUN 41* 24*  CREATININE 1.73* 1.45*  CALCIUM 8.2* 8.1*   PT/INR Recent Labs    06/02/21 0407  LABPROT 14.6  INR 1.1   CMP     Component Value Date/Time   NA 138 06/04/2021 0458   NA 141 03/22/2017 1101   K 3.7 06/04/2021 0458   CL 109 06/04/2021 0458   CO2 23 06/04/2021 0458   GLUCOSE 121 (H) 06/04/2021 0458   BUN 24 (H) 06/04/2021 0458   BUN 17 03/22/2017 1101   CREATININE 1.45 (H) 06/04/2021 0458   CREATININE 1.71 (H) 04/28/2021 1032   CALCIUM 8.1 (L) 06/04/2021 0458   PROT 7.5 06/02/2021 0407   PROT 6.9 03/22/2017 1101   ALBUMIN 3.6 06/02/2021 0407   ALBUMIN 4.5 03/22/2017 1101   AST 38 06/02/2021 0407   AST 27 04/28/2021 1032   ALT 36 06/02/2021 0407   ALT 18 04/28/2021 1032   ALKPHOS 67 06/02/2021 0407   BILITOT 1.6 (H) 06/02/2021 0407   BILITOT 0.5 04/28/2021 1032   GFRNONAA 49 (L) 06/04/2021 0458   GFRNONAA 40 (L) 04/28/2021 1032   GFRAA >60  11/24/2019 1600   Lipase     Component Value Date/Time   LIPASE 27 06/01/2021 1408       Studies/Results: DG Abd 1 View  Result Date: 06/04/2021 CLINICAL DATA:  80 year old male with colonic obstruction. History of pancreatic cancer status post distal pancreatectomy, distal duodenal stent, sigmoid colon stent. EXAM: ABDOMEN - 1 VIEW COMPARISON:  KUB 06/03/2021. CT Abdomen and Pelvis 06/01/2021 and earlier. FINDINGS: Portable AP supine view at 0759 hours. Stable configuration of the left upper quadrant distal duodenum and pelvic sigmoid colon stents. Bowel-gas pattern has mildly improved since 06/01/2021, but gas distended proximal colon persists. No definite pneumoperitoneum on this supine view. Stable visible lung bases. Stable cholecystectomy clips. Stable visualized osseous structures. IMPRESSION: Mildly improved bowel gas pattern since the CT on 06/01/2021, although a degree of partial large bowel obstruction may persist. Electronically Signed   By: Genevie Ann M.D.   On: 06/04/2021 08:38   DG Abd 1 View  Result Date: 06/03/2021 CLINICAL DATA:  Colonic obstruction.  Colon stent in place. EXAM: ABDOMEN - 1 VIEW COMPARISON:  CT 2 days ago FINDINGS: No obstructive bowel gas pattern presently. Intraluminal stents in the distal  sigmoid colon and in the ligament of Treitz region of the proximal small intestine. Previous cholecystectomy clips also noted. Previous gastric sutures. IMPRESSION: Proximal small bowel and distal large bowel stents in place. Radiography does not show an ongoing obstructive pattern. Electronically Signed   By: Nelson Chimes M.D.   On: 06/03/2021 09:50    Anti-infectives: Anti-infectives (From admission, onward)    None        Assessment/Plan Stricture/stenosis of sigmoid colon, benign - Patients no longer appears obstructed - his abdominal pain has resolved, he is NT/ND on exam and he continues to have having multiple BMs. He is tolerating FLD. Okay to hold off on  surgery and continue with conservative management as outlined yesterday - Adv to soft diet - Agree with continuing miralax bowel regimen.  - If continues to do well, he may be okay for discharge tomorrow from our standpoint   Hx metastatic pancreatic cancer  - S/p distal pancreatectomy, splenectomy, and partial gastrectomy in 10/2017  - CT scan shows progressive metastatic disease with new/enlarging pulmonary nodules and enlarging mesenteric soft tissue mass at the duodenal jejunal junction  - recently started chemo for recurrent cancer, Dr. Marin Olp following   FEN - Soft diet, miralax BID VTE - ok for prophylaxis from our standpoint ID - none needed   LOS: 2 days    Jillyn Ledger , [facility] 06/04/2021, 9:34 AM Please see Amion for pager number during day hours 7:00am-4:30pm

## 2021-06-05 ENCOUNTER — Inpatient Hospital Stay (HOSPITAL_COMMUNITY): Payer: Medicare Other

## 2021-06-05 DIAGNOSIS — D72829 Elevated white blood cell count, unspecified: Secondary | ICD-10-CM | POA: Diagnosis not present

## 2021-06-05 DIAGNOSIS — K56609 Unspecified intestinal obstruction, unspecified as to partial versus complete obstruction: Secondary | ICD-10-CM | POA: Diagnosis not present

## 2021-06-05 DIAGNOSIS — C252 Malignant neoplasm of tail of pancreas: Secondary | ICD-10-CM

## 2021-06-05 DIAGNOSIS — E860 Dehydration: Secondary | ICD-10-CM | POA: Diagnosis not present

## 2021-06-05 DIAGNOSIS — N200 Calculus of kidney: Secondary | ICD-10-CM

## 2021-06-05 DIAGNOSIS — N1330 Unspecified hydronephrosis: Secondary | ICD-10-CM

## 2021-06-05 DIAGNOSIS — N19 Unspecified kidney failure: Secondary | ICD-10-CM | POA: Diagnosis not present

## 2021-06-05 LAB — BASIC METABOLIC PANEL
Anion gap: 6 (ref 5–15)
BUN: 17 mg/dL (ref 8–23)
CO2: 23 mmol/L (ref 22–32)
Calcium: 7.9 mg/dL — ABNORMAL LOW (ref 8.9–10.3)
Chloride: 109 mmol/L (ref 98–111)
Creatinine, Ser: 1.4 mg/dL — ABNORMAL HIGH (ref 0.61–1.24)
GFR, Estimated: 51 mL/min — ABNORMAL LOW (ref >60)
Glucose, Bld: 86 mg/dL (ref 70–99)
Potassium: 3.6 mmol/L (ref 3.5–5.1)
Sodium: 138 mmol/L (ref 135–145)

## 2021-06-05 LAB — PHOSPHORUS: Phosphorus: 2.3 mg/dL — ABNORMAL LOW (ref 2.5–4.6)

## 2021-06-05 MED ORDER — PSYLLIUM 95 % PO PACK: 1.0000 | PACK | Freq: Three times a day (TID) | ORAL | 0 refills | Status: DC

## 2021-06-05 MED ORDER — HEPARIN SOD (PORK) LOCK FLUSH 100 UNIT/ML IV SOLN
500.0000 [IU] | INTRAVENOUS | Status: AC | PRN
  Administered 2021-06-05: 500 [IU]

## 2021-06-05 NOTE — Progress Notes (Signed)
Subjective: He reports 2 semiformed bowel movements yesterday, no bowel movement today but is passing gas. He was able to tolerate food yesterday-ate a cheeseburger for lunch and beans and meat for dinner.  Objective: Vital signs in last 24 hours: Temp:  [97.8 F (36.6 C)-98.7 F (37.1 C)] 97.8 F (36.6 C) (06/18 0537) Pulse Rate:  [57-65] 57 (06/18 0537) Resp:  [16-18] 16 (06/18 0537) BP: (128-174)/(63-92) 128/63 (06/18 0537) SpO2:  [97 %-99 %] 97 % (06/18 0537) Weight change:  Last BM Date: 06/04/21  PE: Appears comfortable GENERAL: Not in distress ABDOMEN : soft, nondistended, nontender, normoactive bowel sounds EXTREMITIES: No deformity  Lab Results: Results for orders placed or performed during the hospital encounter of 06/01/21 (from the past 48 hour(s))  Magnesium     Status: None   Collection Time: 06/04/21  4:58 AM  Result Value Ref Range   Magnesium 1.9 1.7 - 2.4 mg/dL    Comment: Performed at [facility], [place] [address], [place], [place] [address]  Phosphorus     Status: Abnormal   Collection Time: 06/04/21  4:58 AM  Result Value Ref Range   Phosphorus 2.4 (L) 2.5 - 4.6 mg/dL    Comment: Performed at [facility], [place] [address], [place], [place] [address]  CBC     Status: Abnormal   Collection Time: 06/04/21  4:58 AM  Result Value Ref Range   WBC 8.2 4.0 - 10.5 K/uL   RBC 3.88 (L) 4.22 - 5.81 MIL/uL   Hemoglobin 12.4 (L) 13.0 - 17.0 g/dL   HCT 38.2 (L) 39.0 - 52.0 %   MCV 98.5 80.0 - 100.0 fL   MCH 32.0 26.0 - 34.0 pg   MCHC 32.5 30.0 - 36.0 g/dL   RDW 14.5 11.5 - 15.5 %   Platelets 446 (H) 150 - 400 K/uL   nRBC 0.0 0.0 - 0.2 %    Comment: Performed at [facility], [place] [address], [place], [place] [address]  Basic metabolic panel     Status: Abnormal   Collection Time: 06/04/21  4:58 AM  Result Value Ref Range   Sodium 138 135 - 145 mmol/L   Potassium 3.7 3.5 - 5.1 mmol/L   Chloride 109 98 -  111 mmol/L   CO2 23 22 - 32 mmol/L   Glucose, Bld 121 (H) 70 - 99 mg/dL    Comment: Glucose reference range applies only to samples taken after fasting for at least 8 hours.   BUN 24 (H) 8 - 23 mg/dL   Creatinine, Ser 1.45 (H) 0.61 - 1.24 mg/dL   Calcium 8.1 (L) 8.9 - 10.3 mg/dL   GFR, Estimated 49 (L) >60 mL/min    Comment: (NOTE) Calculated using the CKD-EPI Creatinine Equation (2021)    Anion gap 6 5 - 15    Comment: Performed at [facility], [place] [address] [place], [place] [address]  Basic metabolic panel     Status: Abnormal   Collection Time: 06/05/21  5:00 AM  Result Value Ref Range   Sodium 138 135 - 145 mmol/L   Potassium 3.6 3.5 - 5.1 mmol/L   Chloride 109 98 - 111 mmol/L   CO2 23 22 - 32 mmol/L   Glucose, Bld 86 70 - 99 mg/dL    Comment: Glucose reference range applies only to samples taken after fasting for at least 8 hours.   BUN 17 8 - 23 mg/dL   Creatinine, Ser 1.40 (H)  0.61 - 1.24 mg/dL   Calcium 7.9 (L) 8.9 - 10.3 mg/dL   GFR, Estimated 51 (L) >60 mL/min    Comment: (NOTE) Calculated using the CKD-EPI Creatinine Equation (2021)    Anion gap 6 5 - 15    Comment: Performed at [facility], [place] [address], [place], [place] [address]  Phosphorus     Status: Abnormal   Collection Time: 06/05/21  5:00 AM  Result Value Ref Range   Phosphorus 2.3 (L) 2.5 - 4.6 mg/dL    Comment: Performed at [facility], [place] [address], [place], [place] [address]    Studies/Results: DG Abd 1 View  Result Date: 06/05/2021 CLINICAL DATA:  Colonic obstruction. History of pancreatic cancer. Recent stent placement. EXAM: ABDOMEN - 1 VIEW COMPARISON:  Abdominal radiograph 06/04/2021. FINDINGS: Stent projects over the left upper quadrant. Nonobstructed bowel gas pattern. Cholecystectomy clips. Additional stent projects over the pelvis. No definite free intraperitoneal air. IMPRESSION: Similar-appearing bowel-gas pattern.  Electronically Signed   By: Lovey Newcomer M.D.   On: 06/05/2021 09:43   DG Abd 1 View  Result Date: 06/04/2021 CLINICAL DATA:  80 year old male with colonic obstruction. History of pancreatic cancer status post distal pancreatectomy, distal duodenal stent, sigmoid colon stent. EXAM: ABDOMEN - 1 VIEW COMPARISON:  KUB 06/03/2021. CT Abdomen and Pelvis 06/01/2021 and earlier. FINDINGS: Portable AP supine view at 0759 hours. Stable configuration of the left upper quadrant distal duodenum and pelvic sigmoid colon stents. Bowel-gas pattern has mildly improved since 06/01/2021, but gas distended proximal colon persists. No definite pneumoperitoneum on this supine view. Stable visible lung bases. Stable cholecystectomy clips. Stable visualized osseous structures. IMPRESSION: Mildly improved bowel gas pattern since the CT on 06/01/2021, although a degree of partial large bowel obstruction may persist. Electronically Signed   By: Genevie Ann M.D.   On: 06/04/2021 08:38    Medications: I have reviewed the patient's current medications.  Assessment: Colonic stricture status post colonic stent placement  Metastatic pancreatic cancer, status post surgery duodenal stent in place  Plan: Okay to DC home from GI standpoint. Advised patient to continue MiraLAX 3 times a day, fiber supplement such as Metamucil/Benefiber/psyllium husk 3 times a day. Discussed with patient that if he develops constipation, inability to have a bowel movement or pass gas or abdominal distention, we may have to reconsider surgery in the future.  Ronnette Juniper, MD 06/05/2021, 10:05 AM

## 2021-06-05 NOTE — Plan of Care (Signed)
Instructions were reviewed with patient. All questions were answered. Patient was transported to main entrance by wheelchair. ° °

## 2021-06-05 NOTE — Progress Notes (Signed)
Subjective/Chief Complaint: Doing well  No complaints   Bowels moving    Objective: Vital signs in last 24 hours: Temp:  [97.8 F (36.6 C)-98.7 F (37.1 C)] 97.8 F (36.6 C) (06/18 0537) Pulse Rate:  [57-65] 57 (06/18 0537) Resp:  [16-18] 16 (06/18 0537) BP: (128-174)/(63-92) 128/63 (06/18 0537) SpO2:  [97 %-99 %] 97 % (06/18 0537) Last BM Date: 06/04/21  Intake/Output from previous day: 06/17 0701 - 06/18 0700 In: 1320 [P.O.:1320] Out: -  Intake/Output this shift: No intake/output data recorded.  Gen: Awake and alert, NAD Lungs: Normal rate and effort Abd: Soft, ND, NT, +BS  Lab Results:  Recent Labs    06/03/21 0342 06/04/21 0458  WBC 9.0 8.2  HGB 12.9* 12.4*  HCT 39.5 38.2*  PLT 454* 446*   BMET Recent Labs    06/04/21 0458 06/05/21 0500  NA 138 138  K 3.7 3.6  CL 109 109  CO2 23 23  GLUCOSE 121* 86  BUN 24* 17  CREATININE 1.45* 1.40*  CALCIUM 8.1* 7.9*   PT/INR No results for input(s): LABPROT, INR in the last 72 hours. ABG No results for input(s): PHART, HCO3 in the last 72 hours.  Invalid input(s): PCO2, PO2  Studies/Results: DG Abd 1 View  Result Date: 06/04/2021 CLINICAL DATA:  80 year old male with colonic obstruction. History of pancreatic cancer status post distal pancreatectomy, distal duodenal stent, sigmoid colon stent. EXAM: ABDOMEN - 1 VIEW COMPARISON:  KUB 06/03/2021. CT Abdomen and Pelvis 06/01/2021 and earlier. FINDINGS: Portable AP supine view at 0759 hours. Stable configuration of the left upper quadrant distal duodenum and pelvic sigmoid colon stents. Bowel-gas pattern has mildly improved since 06/01/2021, but gas distended proximal colon persists. No definite pneumoperitoneum on this supine view. Stable visible lung bases. Stable cholecystectomy clips. Stable visualized osseous structures. IMPRESSION: Mildly improved bowel gas pattern since the CT on 06/01/2021, although a degree of partial large bowel obstruction may  persist. Electronically Signed   By: Genevie Ann M.D.   On: 06/04/2021 08:38   DG Abd 1 View  Result Date: 06/03/2021 CLINICAL DATA:  Colonic obstruction.  Colon stent in place. EXAM: ABDOMEN - 1 VIEW COMPARISON:  CT 2 days ago FINDINGS: No obstructive bowel gas pattern presently. Intraluminal stents in the distal sigmoid colon and in the ligament of Treitz region of the proximal small intestine. Previous cholecystectomy clips also noted. Previous gastric sutures. IMPRESSION: Proximal small bowel and distal large bowel stents in place. Radiography does not show an ongoing obstructive pattern. Electronically Signed   By: Nelson Chimes M.D.   On: 06/03/2021 09:50    Anti-infectives: Anti-infectives (From admission, onward)    None       Assessment/Plan: Stricture/stenosis of sigmoid colon, benign - Patients no longer appears obstructed - his abdominal pain has resolved, he is NT/ND on exam and he continues to have having multiple BMs. He is tolerating regular diet  Okay to hold off on surgery and continue with conservative management as outlined yesterday OK to discharge  - Agree with continuing miralax bowel regimen. - If continues to do well, he may be okay for discharge tomorrow from our standpoint   Hx metastatic pancreatic cancer  - S/p distal pancreatectomy, splenectomy, and partial gastrectomy in 10/2017  - CT scan shows progressive metastatic disease with new/enlarging pulmonary nodules and enlarging mesenteric soft tissue mass at the duodenal jejunal junction  - recently started chemo for recurrent cancer, Dr. Marin Olp following   [place], miralax  BID VTE - ok for prophylaxis from our standpoint ID - none needed   LOS: 3 days    Joyice Faster Keviana Guida 06/05/2021

## 2021-06-05 NOTE — Discharge Summary (Signed)
Physician Discharge Summary  Kevin Villegas OZD:[id number] DOB: [birth year] DOA: 06/01/2021  PCP: Dettinger, [name], MD  Admit date: 06/01/2021 Discharge date: 06/05/2021  Admitted From: Home Disposition: Home  Recommendations for Outpatient Follow-up:  Follow ups as below. Please obtain CBC/BMP/Mag at follow up Please follow up on the following pending results: None  Home Health: None required Equipment/Devices: None required  Discharge Condition: Stable CODE STATUS: Full code   Follow-up Information     Dettinger, [name], MD. Schedule an appointment as soon as possible for a visit in 1 week(s).   Specialties: Family Medicine, Cardiology Why: If symptoms worsen Contact information: [address] [place] [address] [phone]                 Hospital Course: 80 year old M with PMH of pancreatic cancer, large bowel obstruction due to colonic stricture s/p sigmoid colonic stent presented to the hospital with abdominal pain, constipation, nausea and vomiting and admitted for large bowel obstruction proximal to sigmoid colon stent.  GI and general surgery consulted.  Patient was managed conservatively with bowel regimen and dietary adjustments.  Eventually, the obstruction resolved.  He tolerated soft diet and had regular bowel movements.  He was cleared for discharge by general surgery and GI for outpatient follow-up.  He is discharged on MiraLAX and Metamucil per GI recommendation.  See individual problem list below for more on hospital course.  Discharge Diagnoses:  Large bowel obstruction in patient with history of large bowel obstruction due to colonic structure s/p sigmoid colonic stent placement on 05/26/2021: Resolved. -Tolerated soft diet.  Having regular bowel movements. -Cleared for discharge by GI and general surgery. -Discharged on MiraLAX and Metamucil per recommendation by GI   AKI on CKD 3A likely due to volume depletion/dehydration: AKI  resolved. Recent Labs    05/22/21 0444 05/23/21 0546 05/24/21 0502 05/25/21 1003 05/26/21 0433 06/01/21 1408 06/02/21 0407 06/03/21 0342 06/04/21 0458 06/05/21 0500  BUN 13 10 10 8  7* 38* 46* 41* 24* 17  CREATININE 1.42* 1.56* 1.63* 1.51* 1.66* 1.84* 1.89* 1.73* 1.45* 1.40*  -Recheck renal function at follow-up   Unchanged moderate right hydroureteronephrosis with 8 mm distal right ureteral calculus: Urological intervention has been delayed in the setting of  colonic stent placement. -AKI resolved. -Outpatient follow-up with urology as previously planned.  Metastatic pancreatic cancer-CT abdomen and pelvis with unchanged intra-abdominal and intrathoracic metastasis -Outpatient follow-up with oncology.   Leukocytosis: Likely reactive.  Resolved.    Hypophosphatemia.  Resolved.   Severe protein calorie malnutrition: POA Body mass index is 20.57 kg/m. Nutrition Problem: Severe Malnutrition Etiology: chronic illness, cancer and cancer related treatments Signs/Symptoms: moderate fat depletion, moderate muscle depletion, severe muscle depletion, percent weight loss Percent weight loss: 7 % Interventions: Boost Breeze, Prostat, MVI      Discharge Exam: Vitals:   06/04/21 2343 06/05/21 0537  BP: 137/73 128/63  Pulse:  (!) 57  Resp:  16  Temp:  97.8 F (36.6 C)  SpO2:  97%    GENERAL: No apparent distress.  Nontoxic. HEENT: MMM.  Vision and hearing grossly intact.  NECK: Supple.  No apparent JVD.  RESP: On RA.  No IWOB.  Fair aeration bilaterally. CVS:  RRR. Heart sounds normal.  ABD/GI/GU: Bowel sounds present. Soft. Non tender.  MSK/EXT:  Moves extremities. No apparent deformity. No edema.  SKIN: no apparent skin lesion or wound NEURO: Awake, alert and oriented appropriately.  No apparent focal neuro deficit. PSYCH: Calm. Normal affect.  Discharge Instructions  Discharge Instructions     Call MD for:  persistant nausea and vomiting   Complete by: As  directed    Call MD for:  severe uncontrolled pain   Complete by: As directed    Diet general   Complete by: As directed    Discharge instructions   Complete by: As directed    It has been a pleasure taking care of you!  You were hospitalized due to colon obstruction but seems to have resolved.  Please follow-up with general surgeon and gastroenterology as recommended to you.  Continue using your MiraLAX.  We have also started you on Metamucil.  Please review your new medication list and the directions on your medications before you take them.   Take care,   Increase activity slowly   Complete by: As directed       Allergies as of 06/05/2021       Reactions   Pollen Extract Other (See Comments)   Sinus congestion        Medication List     STOP taking these medications    dicyclomine 20 MG tablet Commonly known as: BENTYL       TAKE these medications    lidocaine-prilocaine cream Commonly known as: EMLA Apply 1 application topically as needed. Port access   multivitamin with minerals Tabs tablet Take 1 tablet by mouth daily.   ondansetron 8 MG tablet Commonly known as: ZOFRAN Take 8 mg by mouth every 8 (eight) hours as needed for nausea or vomiting.   polyethylene glycol 17 g packet Commonly known as: MiraLax Take 17 g by mouth 2 (two) times daily.   prochlorperazine 5 MG tablet Commonly known as: COMPAZINE Take 5 mg by mouth every 6 (six) hours as needed for nausea or vomiting.   psyllium 95 % Pack Commonly known as: HYDROCIL/METAMUCIL Take 1 packet by mouth 3 (three) times daily.        Consultations: Gastroenterology General surgery Oncology  Procedures/Studies:   CT Abdomen Pelvis Wo Contrast  Result Date: 06/01/2021 CLINICAL DATA:  Abdominal distension EXAM: CT ABDOMEN AND PELVIS WITHOUT CONTRAST TECHNIQUE: Multidetector CT imaging of the abdomen and pelvis was performed following the standard protocol without IV contrast. COMPARISON:   05/18/2021 FINDINGS: LOWER CHEST: Multiple nodular and cavitary lesions of the lung bases, unchanged. Unchanged size of right lower lobe index nodule (6:4) measuring 15 x 15 mm. HEPATOBILIARY: Normal hepatic contours. No intra- or extrahepatic biliary dilatation. Unchanged hepatic cysts. Status post cholecystectomy. PANCREAS: Postsurgical changes. SPLEEN: Surgically absent. ADRENALS/URINARY TRACT: The adrenal glands are normal. Moderate right hydroureteronephrosis is unchanged, due to unchanged 8 mm distal right ureteral calculus. No left hydronephrosis. The urinary bladder is normal for degree of distention STOMACH/BOWEL: There are stents in the sigmoid colon and distal duodenum. There is diffuse colonic and small bowel dilatation proximal to the sigmoid colon stent. Previously present pneumatosis coli has resolved. Previously described mass adjacent to the distal duodenum is poorly characterized in the absence of intravenous contrast. However, it appears to be unchanged, measuring 2.9 x 2.5 cm. VASCULAR/LYMPHATIC: There is calcific atherosclerosis of the abdominal aorta. No lymphadenopathy. REPRODUCTIVE: Enlarged prostate measures 5.6 cm in transverse dimension. MUSCULOSKELETAL. Multilevel degenerative disc disease and facet arthrosis. No bony spinal canal stenosis. OTHER: None. IMPRESSION: 1. Diffuse colonic and small bowel dilatation proximal to the sigmoid colon stent, which is likely obstructed. Previously present pneumatosis coli has resolved. 2. Unchanged appearance of 8 mm distal right ureteral calculus with moderate right  hydroureteronephrosis. 3. Unchanged appearance of multiple nodular and cavitary metastases of the lung bases. 4. Unchanged appearance of mass adjacent to the distal duodenum. Aortic Atherosclerosis (ICD10-I70.0). Electronically Signed   By: Ulyses Jarred M.D.   On: 06/01/2021 19:44   CT ABDOMEN PELVIS WO CONTRAST  Result Date: 05/18/2021 CLINICAL DATA:  History of pancreatic cancer  status post distal pancreatectomy, worsening abdominal pain EXAM: CT ABDOMEN AND PELVIS WITHOUT CONTRAST TECHNIQUE: Multidetector CT imaging of the abdomen and pelvis was performed following the standard protocol without IV contrast. COMPARISON:  11/24/2019 FINDINGS: Unenhanced CT was performed per clinician order. Lack of IV contrast limits sensitivity and specificity, especially for evaluation of abdominal/pelvic solid viscera. Lower chest: There are numerous bibasilar pulmonary nodules which have increased in size and number since prior study, consistent with progressive pulmonary metastases. Index nodule in the right lower lobe measures 17 x 15 mm image 3/2, previously having measured 7 x 9 mm. No effusion or pneumothorax. Dense calcification of the aortic valve. Hepatobiliary: Stable liver hypodensities likely representing cysts. Gallbladder surgically absent. No biliary duct dilation. Pancreas: The head and uncinate process of the pancreas unremarkable. Distal pancreatectomy has been performed. Spleen: The spleen is surgically absent. Adrenals/Urinary Tract: There is progressive bilateral renal cortical atrophy. There is significant right-sided obstructive uropathy related to an obstructing 8 mm distal right ureteral calculus, reference image 71/2. No left-sided calculi. The bladder is minimally distended which limits its evaluation. The adrenals are grossly normal. Stomach/Bowel: There is pneumatosis involving the cecum, which could be due to ischemia or underlying infection. There is diffuse gaseous distension of the jejunum, ileum, and colon. There is change in the caliber of the distal colon with segmental narrowing and wall thickening of the proximal sigmoid colon, reference image 69 through 73, which could reflect postinflammatory scarring as there is diffuse diverticulosis in this region. Underlying neoplasm cannot be excluded. A metallic stent is seen at the duodenal jejunal junction left upper  quadrant. Spiculated soft tissue mass within the mesentery surrounding the small bowel in this region measures 3.1 x 2.8 cm, consistent with metastatic disease and increased in size since prior study. Vascular/Lymphatic: Mild atherosclerosis of the aorta and its branches. No discrete pathologic adenopathy is identified on this unenhanced exam. Reproductive: Prostate is unremarkable. Other: No free fluid or free intraperitoneal gas. No abdominal wall hernia. Musculoskeletal: No acute or destructive bony lesions. Reconstructed images demonstrate no additional findings. IMPRESSION: 1. Progressive metastatic disease, with new and enlarging pulmonary nodules and enlarging mesenteric soft tissue mass at the duodenal jejunal junction as above. 2. Distension of the distal small bowel and colon, with change in caliber at a thick-walled segment of proximal sigmoid colon. Distal colonic obstruction cannot be excluded, and colonoscopy may be useful to exclude underlying neoplasm in this region. 3. Pneumatosis of the cecum and proximal ascending colon. Differential diagnosis would include bowel ischemia or infection. No evidence of portal venous gas. 4. Right-sided obstructive uropathy due to an 8 mm obstructing distal right ureteral calculus. Critical Value/emergent results were called by telephone at the time of interpretation on 05/18/2021 at 3:28 am to provider DAN FLOYD , who verbally acknowledged these results. Electronically Signed   By: Randa Ngo M.D.   On: 05/18/2021 03:28   DG Abd 1 View  Result Date: 06/05/2021 CLINICAL DATA:  Colonic obstruction. History of pancreatic cancer. Recent stent placement. EXAM: ABDOMEN - 1 VIEW COMPARISON:  Abdominal radiograph 06/04/2021. FINDINGS: Stent projects over the left upper quadrant. Nonobstructed bowel gas pattern.  Cholecystectomy clips. Additional stent projects over the pelvis. No definite free intraperitoneal air. IMPRESSION: Similar-appearing bowel-gas pattern.  Electronically Signed   By: Lovey Newcomer M.D.   On: 06/05/2021 09:43   DG Abd 1 View  Result Date: 06/04/2021 CLINICAL DATA:  80 year old male with colonic obstruction. History of pancreatic cancer status post distal pancreatectomy, distal duodenal stent, sigmoid colon stent. EXAM: ABDOMEN - 1 VIEW COMPARISON:  KUB 06/03/2021. CT Abdomen and Pelvis 06/01/2021 and earlier. FINDINGS: Portable AP supine view at 0759 hours. Stable configuration of the left upper quadrant distal duodenum and pelvic sigmoid colon stents. Bowel-gas pattern has mildly improved since 06/01/2021, but gas distended proximal colon persists. No definite pneumoperitoneum on this supine view. Stable visible lung bases. Stable cholecystectomy clips. Stable visualized osseous structures. IMPRESSION: Mildly improved bowel gas pattern since the CT on 06/01/2021, although a degree of partial large bowel obstruction may persist. Electronically Signed   By: Genevie Ann M.D.   On: 06/04/2021 08:38   DG Abd 1 View  Result Date: 06/03/2021 CLINICAL DATA:  Colonic obstruction.  Colon stent in place. EXAM: ABDOMEN - 1 VIEW COMPARISON:  CT 2 days ago FINDINGS: No obstructive bowel gas pattern presently. Intraluminal stents in the distal sigmoid colon and in the ligament of Treitz region of the proximal small intestine. Previous cholecystectomy clips also noted. Previous gastric sutures. IMPRESSION: Proximal small bowel and distal large bowel stents in place. Radiography does not show an ongoing obstructive pattern. Electronically Signed   By: Nelson Chimes M.D.   On: 06/03/2021 09:50   DG Fluoro Rm 1-60 Min  Result Date: 05/26/2021 CLINICAL DATA:  Colonic stent placement. EXAM: DG C-ARM 1-60 MIN CONTRAST:  None. FLUOROSCOPY TIME:  Fluoroscopy Time:  2 minutes 29 seconds Number of Acquired Spot Images: 6 COMPARISON:  Barium enema 05/24/2021. CT abdomen and pelvis 05/18/2021. FINDINGS: A stent has been placed over the region of the sigmoid colon. Narrowing  of the distal portion of the stent noted. IMPRESSION: Stent placement as above Electronically Signed   By: Marcello Moores  Register   On: 05/26/2021 12:29   DG ABD ACUTE 2+V W 1V CHEST  Result Date: 05/16/2021 CLINICAL DATA:  Abdominal pain, constipation, history of pancreatic cancer EXAM: DG ABDOMEN ACUTE WITH 1 VIEW CHEST COMPARISON:  CT abdomen/pelvis dated 11/24/2019 FINDINGS: Nodular opacities in the bilateral upper lobes and left lower lobe, suspicious for metastases. Right chest power port terminates in the lower SVC. The heart is normal in size. Nonobstructive bowel gas pattern with gas and fluid-filled loops of colon in the upper abdomen. Cholecystectomy clips. Surgical sutures in the left upper abdomen. Metallic stent in the left mid abdomen. Mild degenerative changes of the lumbar spine. IMPRESSION: Multiple bilateral pulmonary nodules, suspicious for metastases. Nonobstructive bowel gas pattern. Postsurgical changes in the abdomen. Electronically Signed   By: Julian Hy M.D.   On: 05/16/2021 06:51   DG BE (COLON)W SINGLE CM (SOL OR THIN BA)  Result Date: 05/24/2021 CLINICAL DATA:  Sigmoid stricture. EXAM: ESOPHOGRAM/BARIUM SWALLOW TECHNIQUE: Single contrast examination was performed using 50% diluted water soluble contrast. FLUOROSCOPY TIME:  Radiation Exposure Index (if provided by the fluoroscopic device): 559.8 mGy. COMPARISON:  May 18, 2021. FINDINGS: There is noted a moderate persistent stricture near the rectosigmoid junction. Extensive diverticulosis is noted in the sigmoid colon which significantly obscures evaluation of the colon. Initially contrast would not advanced through a portion of the proximal sigmoid colon which grossly corresponds to stricture seen on CT scan. Eventually contrast did  pass through with filling of the more proximal descending colon, which is full of stool. Evaluation of the stricture is limited due to overlapping loops of colon. There does appear to be a moderate  to severe narrowing involving the proximal sigmoid colon which potentially may dilated some, but this cannot be said with certainty due to above limitations. IMPRESSION: Difficult exam due to overlying loops of colon as well as extensive diverticulosis. There does appear to be a moderate to severe narrowing or stricture involving the proximal sigmoid colon which corresponds roughly to the abnormality seen on prior CT scan. Eventually contrast was passed through this area into the more proximal descending colon, which is full of stool. It cannot be said with certainty if there was any significant dilatation or change in caliber with this stricture throughout the exam. As noted in the findings, there is also a moderate persistent stricture in the region of the rectosigmoid junction which does not appear to cause significant obstruction. Electronically Signed   By: Marijo Conception M.D.   On: 05/24/2021 14:59       The results of significant diagnostics from this hospitalization (including imaging, microbiology, ancillary and laboratory) are listed below for reference.     Microbiology: Recent Results (from the past 240 hour(s))  Resp Panel by RT-PCR (Flu A&B, Covid) Nasopharyngeal Swab     Status: None   Collection Time: 06/01/21  8:35 PM   Specimen: Nasopharyngeal Swab; Nasopharyngeal(NP) swabs in vial transport medium  Result Value Ref Range Status   SARS Coronavirus 2 by RT PCR NEGATIVE NEGATIVE Final    Comment: (NOTE) SARS-CoV-2 target nucleic acids are NOT DETECTED.  The SARS-CoV-2 RNA is generally detectable in upper respiratory specimens during the acute phase of infection. The lowest concentration of SARS-CoV-2 viral copies this assay can detect is 138 copies/mL. A negative result does not preclude SARS-Cov-2 infection and should not be used as the sole basis for treatment or other patient management decisions. A negative result may occur with  improper specimen collection/handling,  submission of specimen other than nasopharyngeal swab, presence of viral mutation(s) within the areas targeted by this assay, and inadequate number of viral copies(<138 copies/mL). A negative result must be combined with clinical observations, patient history, and epidemiological information. The expected result is Negative.  Fact Sheet for Patients:  EntrepreneurPulse.com.au  Fact Sheet for Healthcare Providers:  IncredibleEmployment.be  This test is no t yet approved or cleared by the [place] FDA and  has been authorized for detection and/or diagnosis of SARS-CoV-2 by FDA under an Emergency Use Authorization (EUA). This EUA will remain  in effect (meaning this test can be used) for the duration of the COVID-19 declaration under Section 564(b)(1) of the Act, 21 U.S.C.section 360bbb-3(b)(1), unless the authorization is terminated  or revoked sooner.       Influenza A by PCR NEGATIVE NEGATIVE Final   Influenza B by PCR NEGATIVE NEGATIVE Final    Comment: (NOTE) The Xpert Xpress SARS-CoV-2/FLU/RSV plus assay is intended as an aid in the diagnosis of influenza from Nasopharyngeal swab specimens and should not be used as a sole basis for treatment. Nasal washings and aspirates are unacceptable for Xpert Xpress SARS-CoV-2/FLU/RSV testing.  Fact Sheet for Patients: EntrepreneurPulse.com.au  Fact Sheet for Healthcare Providers: IncredibleEmployment.be  This test is not yet approved or cleared by the [place] FDA and has been authorized for detection and/or diagnosis of SARS-CoV-2 by FDA under an Emergency Use Authorization (EUA). This EUA will remain in  effect (meaning this test can be used) for the duration of the COVID-19 declaration under Section 564(b)(1) of the Act, 21 U.S.C. section 360bbb-3(b)(1), unless the authorization is terminated or revoked.  Performed at [facility], [place] [address], [place], [place] [address]      Labs:  CBC: Recent Labs  Lab 06/01/21 1408 06/02/21 0407 06/03/21 0342 06/04/21 0458  WBC 15.6* 17.1* 9.0 8.2  HGB 14.9 14.4 12.9* 12.4*  HCT 44.9 43.9 39.5 38.2*  MCV 97.2 96.9 98.0 98.5  PLT 498* 530* 454* 446*   BMP &GFR Recent Labs  Lab 06/01/21 1408 06/02/21 0407 06/03/21 0342 06/04/21 0458 06/05/21 0500  NA 135 134* 137 138 138  K 4.9 4.6 4.2 3.7 3.6  CL 100 98 106 109 109  CO2 22 24 24 23 23   GLUCOSE 106* 106* 137* 121* 86  BUN 38* 46* 41* 24* 17  CREATININE 1.84* 1.89* 1.73* 1.45* 1.40*  CALCIUM 9.3 8.9 8.2* 8.1* 7.9*  MG 2.6* 2.6* 2.3 1.9  --   PHOS  --   --  2.2* 2.4* 2.3*   Estimated Creatinine Clearance: 40.5 mL/min (A) (by C-G formula based on SCr of 1.4 mg/dL (H)). Liver & Pancreas: Recent Labs  Lab 06/01/21 1408 06/02/21 0407  AST 44* 38  ALT 42 36  ALKPHOS 68 67  BILITOT 1.1 1.6*  PROT 7.9 7.5  ALBUMIN 3.8 3.6   Recent Labs  Lab 06/01/21 1408  LIPASE 27   No results for input(s): AMMONIA in the last 168 hours. Diabetic: No results for input(s): HGBA1C in the last 72 hours. No results for input(s): GLUCAP in the last 168 hours. Cardiac Enzymes: No results for input(s): CKTOTAL, CKMB, CKMBINDEX, TROPONINI in the last 168 hours. No results for input(s): PROBNP in the last 8760 hours. Coagulation Profile: Recent Labs  Lab 06/02/21 0407  INR 1.1   Thyroid Function Tests: No results for input(s): TSH, T4TOTAL, FREET4, T3FREE, THYROIDAB in the last 72 hours. Lipid Profile: No results for input(s): CHOL, HDL, LDLCALC, TRIG, CHOLHDL, LDLDIRECT in the last 72 hours. Anemia Panel: No results for input(s): VITAMINB12, FOLATE, FERRITIN, TIBC, IRON, RETICCTPCT in the last 72 hours. Urine analysis:    Component Value Date/Time   COLORURINE YELLOW 06/01/2021 1327   APPEARANCEUR HAZY (A) 06/01/2021 1327   LABSPEC 1.023 06/01/2021 1327   PHURINE 5.0 06/01/2021 1327   GLUCOSEU  NEGATIVE 06/01/2021 1327   HGBUR NEGATIVE 06/01/2021 1327   BILIRUBINUR NEGATIVE 06/01/2021 1327   KETONESUR 20 (A) 06/01/2021 1327   PROTEINUR NEGATIVE 06/01/2021 1327   UROBILINOGEN 0.2 08/23/2015 1555   NITRITE NEGATIVE 06/01/2021 1327   LEUKOCYTESUR TRACE (A) 06/01/2021 1327   Sepsis Labs: Invalid input(s): PROCALCITONIN, LACTICIDVEN   Time coordinating discharge: 35 minutes  SIGNED:  Mercy Riding, MD  [facility] 06/05/2021, 3:48 PM  If 7PM-7AM, please contact night-coverage [email or web address]

## 2021-06-07 ENCOUNTER — Telehealth: Payer: Self-pay

## 2021-06-07 ENCOUNTER — Encounter: Payer: Self-pay | Admitting: Hematology & Oncology

## 2021-06-07 ENCOUNTER — Encounter: Payer: Self-pay | Admitting: *Deleted

## 2021-06-07 NOTE — Telephone Encounter (Signed)
Transition Care Management Unsuccessful Follow-up Telephone Call  Date of discharge and from where:  [name] 06/05/21  Diagnosis: large bowel obstruction   Attempts:  1st Attempt  Reason for unsuccessful TCM follow-up call:  Left voice message

## 2021-06-07 NOTE — Progress Notes (Signed)
Called patient after discharge from his most recent hospitalization. Patient is doing OK and has been following his DC instructions. He is very frustrated at the lack of clear follow up from GI and General Surgery. He feels like he might still need surgery but doesn't know how that will be managed. He also states that if he needs urgent care, he doesn't want to return to the ED, but has been told that GI will not directly admit. Explained to patient that many specialists do not direct admit, and if he needs urgent care, he really will need to go to the ED. I offered to follow up with GI and [facility] to see if there was any clarification I could get for discharge follow up but he declined.   Spoke to him about his desire to move his oncology care to this office. He does confirm this. I spoke to him about scheduling a post discharge follow up appointment, but he declines. He states that at this time, he needs to focus on the GI issues and make sure that he doesn't need to have surgery. Once he knows he's ready to restart his chemo, he will reach out to schedule. He would like this to be a quick turnaround without any further delay to initiate treatment.  Spoke to Dr Marin Olp. He will go ahead and place orders for his chemo to allow for PA. Once patient is ready to schedule his follow up, we should be able to initiate his next cycle at the same appointment.   Patient was given my direct phone number so that he can direct any questions or concerns my way, and to call me when he was ready to schedule an appointment.   Oncology Nurse Navigator Documentation  Oncology Nurse Navigator Flowsheets 06/07/2021  Navigator Location [facility]  Navigator Encounter Type Telephone  Telephone Appt Confirmation/Clarification;Asess Navigation Needs;Outgoing Call  Patient Visit Type MedOnc  Treatment Phase Active Tx  Barriers/Navigation Needs Coordination of Care;Education  Education Other  Interventions Coordination of  Care;Education;Psycho-Social Support  Acuity Level 2-Minimal Needs (1-2 Barriers Identified)  Coordination of Care Appts;Other  Education Method Verbal  Support Groups/Services Friends and Family  Time Spent with Patient 19

## 2021-06-08 NOTE — Telephone Encounter (Signed)
Transition Care Management Follow-up Telephone Call Date of discharge and from where: 06/05/21 [name] Diagnosis: Large Bowel Obstruction How have you been since you were released from the hospital? He is passing very little stool despite taking miralax and metamucil TID. No pain right now. Any questions or concerns? Yes He wants to try to avoid getting a colostomy, but fears this will happen if he can't pass more stool than he is. Wants to be seen asap before this gets bad again.  Items Reviewed: Did the pt receive and understand the discharge instructions provided? Yes  Medications obtained and verified? Yes  Other? No  Any new allergies since your discharge? No  Dietary orders reviewed? Yes Do you have support at home? Yes   Home Care and Equipment/Supplies: Were home health services ordered? no  Were any new equipment or medical supplies ordered?  No  Functional Questionnaire: (I = Independent and D = Dependent) ADLs: I  Bathing/Dressing- I  Meal Prep- I  Eating- I  Maintaining continence- I  Transferring/Ambulation- I  Managing Meds- I  Follow up appointments reviewed:  PCP Hospital f/u appt confirmed?  WAITING FOR RESPONSE FROM [facility].   CAN YOU see on 6/22 @ 4:10? Not 30 minutes, but soonest I see to et patient in. NPs also have no openings - He would prefer phone visit, but will come in to office if you need him to. [facility] f/u appt confirmed? No  Supposed to see GI soon, but they haven't given him a time yet. Are transportation arrangements needed? No  If their condition worsens, is the pt aware to call PCP or go to the Emergency Dept.? Yes Was the patient provided with contact information for the PCP's office or ED? Yes Was to pt encouraged to call back with questions or concerns? Yes

## 2021-06-09 ENCOUNTER — Encounter (HOSPITAL_BASED_OUTPATIENT_CLINIC_OR_DEPARTMENT_OTHER): Payer: Self-pay

## 2021-06-09 ENCOUNTER — Other Ambulatory Visit: Payer: Self-pay

## 2021-06-09 ENCOUNTER — Inpatient Hospital Stay (HOSPITAL_BASED_OUTPATIENT_CLINIC_OR_DEPARTMENT_OTHER)
Admission: EM | Admit: 2021-06-09 | Discharge: 2021-06-18 | DRG: 329 | Disposition: A | Discharge status: Home / Self Care | Payer: Medicare Other | Attending: Internal Medicine | Admitting: Internal Medicine

## 2021-06-09 ENCOUNTER — Emergency Department (HOSPITAL_BASED_OUTPATIENT_CLINIC_OR_DEPARTMENT_OTHER): Payer: Medicare Other

## 2021-06-09 ENCOUNTER — Encounter: Payer: Self-pay | Admitting: *Deleted

## 2021-06-09 DIAGNOSIS — C7919 Secondary malignant neoplasm of other urinary organs: Secondary | ICD-10-CM | POA: Diagnosis present

## 2021-06-09 DIAGNOSIS — K5730 Diverticulosis of large intestine without perforation or abscess without bleeding: Secondary | ICD-10-CM | POA: Diagnosis present

## 2021-06-09 DIAGNOSIS — J9601 Acute respiratory failure with hypoxia: Secondary | ICD-10-CM | POA: Diagnosis not present

## 2021-06-09 DIAGNOSIS — N1831 Chronic kidney disease, stage 3a: Secondary | ICD-10-CM | POA: Diagnosis present

## 2021-06-09 DIAGNOSIS — E43 Unspecified severe protein-calorie malnutrition: Secondary | ICD-10-CM | POA: Diagnosis present

## 2021-06-09 DIAGNOSIS — N134 Hydroureter: Secondary | ICD-10-CM | POA: Diagnosis not present

## 2021-06-09 DIAGNOSIS — N179 Acute kidney failure, unspecified: Secondary | ICD-10-CM | POA: Diagnosis present

## 2021-06-09 DIAGNOSIS — C7801 Secondary malignant neoplasm of right lung: Secondary | ICD-10-CM | POA: Diagnosis present

## 2021-06-09 DIAGNOSIS — C252 Malignant neoplasm of tail of pancreas: Secondary | ICD-10-CM | POA: Diagnosis present

## 2021-06-09 DIAGNOSIS — Z823 Family history of stroke: Secondary | ICD-10-CM

## 2021-06-09 DIAGNOSIS — A419 Sepsis, unspecified organism: Secondary | ICD-10-CM

## 2021-06-09 DIAGNOSIS — C784 Secondary malignant neoplasm of small intestine: Secondary | ICD-10-CM | POA: Diagnosis present

## 2021-06-09 DIAGNOSIS — K56609 Unspecified intestinal obstruction, unspecified as to partial versus complete obstruction: Secondary | ICD-10-CM | POA: Diagnosis present

## 2021-06-09 DIAGNOSIS — I700 Atherosclerosis of aorta: Secondary | ICD-10-CM | POA: Diagnosis present

## 2021-06-09 DIAGNOSIS — N289 Disorder of kidney and ureter, unspecified: Secondary | ICD-10-CM

## 2021-06-09 DIAGNOSIS — R7303 Prediabetes: Secondary | ICD-10-CM | POA: Diagnosis present

## 2021-06-09 DIAGNOSIS — K56699 Other intestinal obstruction unspecified as to partial versus complete obstruction: Principal | ICD-10-CM | POA: Diagnosis present

## 2021-06-09 DIAGNOSIS — Z903 Acquired absence of stomach [part of]: Secondary | ICD-10-CM

## 2021-06-09 DIAGNOSIS — Z20822 Contact with and (suspected) exposure to covid-19: Secondary | ICD-10-CM | POA: Diagnosis present

## 2021-06-09 DIAGNOSIS — R112 Nausea with vomiting, unspecified: Secondary | ICD-10-CM

## 2021-06-09 DIAGNOSIS — N201 Calculus of ureter: Secondary | ICD-10-CM | POA: Diagnosis not present

## 2021-06-09 DIAGNOSIS — J690 Pneumonitis due to inhalation of food and vomit: Secondary | ICD-10-CM | POA: Diagnosis not present

## 2021-06-09 DIAGNOSIS — N132 Hydronephrosis with renal and ureteral calculous obstruction: Secondary | ICD-10-CM | POA: Diagnosis present

## 2021-06-09 DIAGNOSIS — C7802 Secondary malignant neoplasm of left lung: Secondary | ICD-10-CM | POA: Diagnosis present

## 2021-06-09 DIAGNOSIS — Z9221 Personal history of antineoplastic chemotherapy: Secondary | ICD-10-CM | POA: Diagnosis not present

## 2021-06-09 DIAGNOSIS — Z933 Colostomy status: Secondary | ICD-10-CM

## 2021-06-09 DIAGNOSIS — Z9081 Acquired absence of spleen: Secondary | ICD-10-CM | POA: Diagnosis not present

## 2021-06-09 DIAGNOSIS — Z90411 Acquired partial absence of pancreas: Secondary | ICD-10-CM | POA: Diagnosis not present

## 2021-06-09 LAB — COMPREHENSIVE METABOLIC PANEL
ALT: 31 U/L (ref 0–44)
AST: 33 U/L (ref 15–41)
Albumin: 3.6 g/dL (ref 3.5–5.0)
Alkaline Phosphatase: 78 U/L (ref 38–126)
Anion gap: 8 (ref 5–15)
BUN: 26 mg/dL — ABNORMAL HIGH (ref 8–23)
CO2: 25 mmol/L (ref 22–32)
Calcium: 9.1 mg/dL (ref 8.9–10.3)
Chloride: 102 mmol/L (ref 98–111)
Creatinine, Ser: 1.48 mg/dL — ABNORMAL HIGH (ref 0.61–1.24)
GFR, Estimated: 48 mL/min — ABNORMAL LOW (ref >60)
Glucose, Bld: 113 mg/dL — ABNORMAL HIGH (ref 70–99)
Potassium: 4.6 mmol/L (ref 3.5–5.1)
Sodium: 135 mmol/L (ref 135–145)
Total Bilirubin: 0.4 mg/dL (ref 0.3–1.2)
Total Protein: 7.4 g/dL (ref 6.5–8.1)

## 2021-06-09 LAB — RESP PANEL BY RT-PCR (FLU A&B, COVID) ARPGX2
Influenza A by PCR: NEGATIVE
Influenza B by PCR: NEGATIVE
SARS Coronavirus 2 by RT PCR: NEGATIVE

## 2021-06-09 LAB — URINALYSIS, ROUTINE W REFLEX MICROSCOPIC
Bilirubin Urine: NEGATIVE
Glucose, UA: NEGATIVE mg/dL
Hgb urine dipstick: NEGATIVE
Ketones, ur: NEGATIVE mg/dL
Leukocytes,Ua: NEGATIVE
Nitrite: NEGATIVE
Protein, ur: NEGATIVE mg/dL
Specific Gravity, Urine: 1.025 (ref 1.005–1.030)
pH: 5.5 (ref 5.0–8.0)

## 2021-06-09 LAB — CBC WITH DIFFERENTIAL/PLATELET
Abs Immature Granulocytes: 0.05 10*3/uL (ref 0.00–0.07)
Basophils Absolute: 0.1 10*3/uL (ref 0.0–0.1)
Basophils Relative: 1 %
Eosinophils Absolute: 0.1 10*3/uL (ref 0.0–0.5)
Eosinophils Relative: 1 %
HCT: 43.3 % (ref 39.0–52.0)
Hemoglobin: 14.5 g/dL (ref 13.0–17.0)
Immature Granulocytes: 0 %
Lymphocytes Relative: 17 %
Lymphs Abs: 2.3 10*3/uL (ref 0.7–4.0)
MCH: 32.1 pg (ref 26.0–34.0)
MCHC: 33.5 g/dL (ref 30.0–36.0)
MCV: 95.8 fL (ref 80.0–100.0)
Monocytes Absolute: 1.5 10*3/uL — ABNORMAL HIGH (ref 0.1–1.0)
Monocytes Relative: 11 %
Neutro Abs: 9.8 10*3/uL — ABNORMAL HIGH (ref 1.7–7.7)
Neutrophils Relative %: 70 %
Platelets: 530 10*3/uL — ABNORMAL HIGH (ref 150–400)
RBC: 4.52 MIL/uL (ref 4.22–5.81)
RDW: 14.6 % (ref 11.5–15.5)
WBC: 13.8 10*3/uL — ABNORMAL HIGH (ref 4.0–10.5)
nRBC: 0 % (ref 0.0–0.2)

## 2021-06-09 LAB — LIPASE, BLOOD: Lipase: 33 U/L (ref 11–51)

## 2021-06-09 MED ORDER — ONDANSETRON HCL 4 MG/2ML IJ SOLN
4.0000 mg | Freq: Four times a day (QID) | INTRAMUSCULAR | Status: DC | PRN
  Administered 2021-06-10 – 2021-06-11 (×3): 4 mg via INTRAVENOUS
  Filled 2021-06-09 (×3): qty 2

## 2021-06-09 MED ORDER — SODIUM CHLORIDE 0.9 % IV BOLUS
500.0000 mL | Freq: Once | INTRAVENOUS | Status: AC
  Administered 2021-06-09: 500 mL via INTRAVENOUS

## 2021-06-09 MED ORDER — HEPARIN SODIUM (PORCINE) 5000 UNIT/ML IJ SOLN
5000.0000 [IU] | Freq: Three times a day (TID) | INTRAMUSCULAR | Status: DC
  Administered 2021-06-09 – 2021-06-13 (×2): 5000 [IU] via SUBCUTANEOUS
  Filled 2021-06-09 (×4): qty 1

## 2021-06-09 MED ORDER — LACTATED RINGERS IV SOLN: INTRAVENOUS | Status: AC

## 2021-06-09 MED ORDER — SODIUM CHLORIDE 0.9% FLUSH
10.0000 mL | Freq: Two times a day (BID) | INTRAVENOUS | Status: DC
  Administered 2021-06-10 – 2021-06-14 (×3): 10 mL

## 2021-06-09 MED ORDER — HYDROMORPHONE HCL 1 MG/ML IJ SOLN: 1.0000 mg | INTRAMUSCULAR | Status: DC | PRN

## 2021-06-09 MED ORDER — SODIUM CHLORIDE 0.9 % IV SOLN
INTRAVENOUS | Status: DC | PRN
  Administered 2021-06-09 – 2021-06-10 (×2): 1000 mL via INTRAVENOUS

## 2021-06-09 MED ORDER — ONDANSETRON HCL 4 MG PO TABS: 4.0000 mg | ORAL_TABLET | Freq: Four times a day (QID) | ORAL | Status: DC | PRN

## 2021-06-09 MED ORDER — ONDANSETRON HCL 4 MG/2ML IJ SOLN
4.0000 mg | Freq: Once | INTRAMUSCULAR | Status: AC
  Administered 2021-06-09: 4 mg via INTRAVENOUS
  Filled 2021-06-09: qty 2

## 2021-06-09 MED ORDER — MORPHINE SULFATE (PF) 4 MG/ML IV SOLN
4.0000 mg | Freq: Once | INTRAVENOUS | Status: AC
  Administered 2021-06-09: 4 mg via INTRAVENOUS
  Filled 2021-06-09: qty 1

## 2021-06-09 MED ORDER — LIDOCAINE VISCOUS HCL 2 % MT SOLN
15.0000 mL | Freq: Once | OROMUCOSAL | Status: AC
  Administered 2021-06-09: 15 mL via OROMUCOSAL
  Filled 2021-06-09: qty 15

## 2021-06-09 MED ORDER — SODIUM CHLORIDE 0.9% FLUSH: 10.0000 mL | INTRAVENOUS | Status: DC | PRN

## 2021-06-09 MED ORDER — LORAZEPAM 2 MG/ML IJ SOLN
1.0000 mg | Freq: Once | INTRAMUSCULAR | Status: AC
  Administered 2021-06-09: 1 mg via INTRAVENOUS
  Filled 2021-06-09: qty 1

## 2021-06-09 MED ORDER — CHLORHEXIDINE GLUCONATE CLOTH 2 % EX PADS
6.0000 | MEDICATED_PAD | Freq: Every day | CUTANEOUS | Status: DC
  Administered 2021-06-10 – 2021-06-14 (×4): 6 via TOPICAL

## 2021-06-09 NOTE — ED Provider Notes (Addendum)
[facility] Provider Note   CSN: [id number] Arrival date & time: 06/09/21  1134    History Chief Complaint  Patient presents with   Constipation    Kevin Villegas is a 80 y.o. male with pancreatic CA, s/p colonic stint with recurrent large bowel obstructions recent dc 4 days ago who presents for evaluation abdominal pain, nausea and vomiting.  States this feels like his prior obstructions.  He feels like he has a distended abdomen.  Last bowel movement on Sunday.  Was passing flatulence up until 24 hours ago.  He denies any urinary difficulty.  He is felt persistently nauseous.  He had an episode of NBNB emesis yesterday. Rates pain a 8/10. Ne fever, chills, CP, SOB, numbness. Feels generally weak which he relates to decreased PO intake. Denies additional aggravating or alleviating factors  Last PO intake yesterday evening.  History obtained from patient and past medical records.  No interpreter used    HPI     Past Medical History:  Diagnosis Date   Large bowel obstruction ([place])    Pancreatic cancer [facility])     Patient Active Problem List   Diagnosis Date Noted   Dehydration 06/02/2021   Acute prerenal azotemia 06/02/2021   Leukocytosis 06/02/2021   Right ureteral stone 06/02/2021   Protein-calorie malnutrition, severe 06/02/2021   Bowel obstruction ([place]) 06/02/2021   Large bowel obstruction ([place]) 06/01/2021   Pneumatosis coli    Colon stricture ([place])    Colon obstruction ([place]) 05/18/2021   Pancreatic mass 06/27/2019   Malignant neoplasm of tail of pancreas ([place]) 06/27/2019   Rhinitis medicamentosa 02/26/2019    Past Surgical History:  Procedure Laterality Date   ABDOMINAL SURGERY     BIOPSY  05/20/2021   Procedure: BIOPSY;  Surgeon: Otis Brace, MD;  Location: [facility];  Service: Gastroenterology;;   CHOLECYSTECTOMY N/A 12/16/2014   Procedure: LAPAROSCOPIC CHOLECYSTECTOMY WITH INTRAOPERATIVE CHOLANGIOGRAM;  Surgeon: Georganna Skeans, MD;  Location: [facility];  Service: General;  Laterality: N/A;   COLONIC STENT PLACEMENT N/A 05/26/2021   Procedure: COLONIC STENT PLACEMENT;  Surgeon: Clarene Essex, MD;  Location: [facility];  Service: Endoscopy;  Laterality: N/A;   ESOPHAGOGASTRODUODENOSCOPY N/A 05/20/2021   Procedure: ESOPHAGOGASTRODUODENOSCOPY (EGD);  Surgeon: Otis Brace, MD;  Location: [facility];  Service: Gastroenterology;  Laterality: N/A;   EUS N/A 09/24/2015   Procedure: UPPER ENDOSCOPIC ULTRASOUND (EUS) LINEAR;  Surgeon: Milus Banister, MD;  Location: [facility];  Service: Endoscopy;  Laterality: N/A;   FLEXIBLE SIGMOIDOSCOPY N/A 05/20/2021   Procedure: FLEXIBLE SIGMOIDOSCOPY;  Surgeon: Otis Brace, MD;  Location: [facility];  Service: Gastroenterology;  Laterality: N/A;   FLEXIBLE SIGMOIDOSCOPY N/A 05/26/2021   Procedure: FLEXIBLE SIGMOIDOSCOPY;  Surgeon: Clarene Essex, MD;  Location: [facility];  Service: Endoscopy;  Laterality: N/A;   VASECTOMY         Family History  Problem Relation Age of Onset   Stroke Mother     Social History   Tobacco Use   Smoking status: Never   Smokeless tobacco: Never  Vaping Use   Vaping Use: Never used  Substance Use Topics   Alcohol use: No    Alcohol/week: 0.0 standard drinks   Drug use: No    Home Medications Prior to Admission medications   Medication Sig Start Date End Date Taking? Authorizing Provider  lidocaine-prilocaine (EMLA) cream Apply 1 application topically as needed. Port access 05/04/21   [provider]  Multiple Vitamin (MULTIVITAMIN WITH MINERALS) TABS tablet Take 1  tablet by mouth daily. 05/26/21   Georgette Shell, MD  ondansetron (ZOFRAN) 8 MG tablet Take 8 mg by mouth every 8 (eight) hours as needed for nausea or vomiting. 05/04/21   [provider]  polyethylene glycol (MIRALAX) 17 g packet Take 17 g by mouth 2 (two) times daily. 05/26/21   Georgette Shell, MD  prochlorperazine (COMPAZINE) 5 MG tablet  Take 5 mg by mouth every 6 (six) hours as needed for nausea or vomiting. 05/04/21   [provider]  psyllium (HYDROCIL/METAMUCIL) 95 % PACK Take 1 packet by mouth 3 (three) times daily. 06/05/21 07/05/21  Mercy Riding, MD    Allergies    Pollen extract  Review of Systems   Review of Systems  Constitutional: Negative.   HENT: Negative.    Respiratory: Negative.    Cardiovascular: Negative.   Gastrointestinal:  Positive for abdominal pain, constipation, nausea and vomiting. Negative for abdominal distention, anal bleeding, blood in stool, diarrhea and rectal pain.  Genitourinary: Negative.   Musculoskeletal: Negative.   Skin: Negative.   Neurological: Negative.   All other systems reviewed and are negative.  Physical Exam Updated Vital Signs BP 139/87 (BP Location: Right Arm)   Pulse 78   Temp 98.3 F (36.8 C) (Oral)   Resp 18   Ht 5\' 11"  (1.803 m)   Wt 69.4 kg   SpO2 98%   BMI 21.34 kg/m   Physical Exam Vitals and nursing note reviewed.  Constitutional:      General: He is not in acute distress.    Appearance: He is well-developed. He is not ill-appearing, toxic-appearing or diaphoretic.  HENT:     Head: Normocephalic and atraumatic.     Nose: Nose normal.     Mouth/Throat:     Mouth: Mucous membranes are moist.  Eyes:     Pupils: Pupils are equal, round, and reactive to light.  Cardiovascular:     Rate and Rhythm: Normal rate and regular rhythm.     Heart sounds: Normal heart sounds.  Pulmonary:     Effort: Pulmonary effort is normal. No respiratory distress.     Breath sounds: Normal breath sounds.  Chest:     Comments: Port to right upper chest without erythema, warmth Abdominal:     General: Bowel sounds are decreased. There is distension.     Palpations: Abdomen is soft.     Tenderness: There is abdominal tenderness. There is no guarding or rebound.  Musculoskeletal:        General: Normal range of motion.     Cervical back: Normal range of  motion and neck supple.  Skin:    General: Skin is warm and dry.  Neurological:     General: No focal deficit present.     Mental Status: He is alert and oriented to person, place, and time.    ED Results / Procedures / Treatments   Labs (all labs ordered are listed, but only abnormal results are displayed) Labs Reviewed  CBC WITH DIFFERENTIAL/PLATELET - Abnormal; Notable for the following components:      Result Value   WBC 13.8 (*)    Platelets 530 (*)    Neutro Abs 9.8 (*)    Monocytes Absolute 1.5 (*)    All other components within normal limits  COMPREHENSIVE METABOLIC PANEL - Abnormal; Notable for the following components:   Glucose, Bld 113 (*)    BUN 26 (*)    Creatinine, Ser 1.48 (*)  GFR, Estimated 48 (*)    All other components within normal limits  RESP PANEL BY RT-PCR (FLU A&B, COVID) ARPGX2  LIPASE, BLOOD  URINALYSIS, ROUTINE W REFLEX MICROSCOPIC    EKG None  Radiology CT Abdomen Pelvis Wo Contrast  Result Date: 06/09/2021 CLINICAL DATA:  80 year old male with history of acute onset of nonlocalized abdominal pain. History of pancreatic cancer. EXAM: CT ABDOMEN AND PELVIS WITHOUT CONTRAST TECHNIQUE: Multidetector CT imaging of the abdomen and pelvis was performed following the standard protocol without IV contrast. COMPARISON:  CT the abdomen pelvis 06/01/2021. FINDINGS: Lower chest: Severe calcifications of the aortic valve. Numerous pulmonary nodules noted throughout the visualize lung bases, similar to the recent prior study, measuring up to 1.7 x 1.5 cm in the right lower lobe (axial image 2 of series 4) and 1.7 x 1.9 cm in the inferior aspect of the lingula (axial image 1 of series 4). Several of these pulmonary nodules demonstrate internal cavitation. Hepatobiliary: Multiple well-defined low-attenuation lesions are again noted in the liver, incompletely characterized on today's non-contrast CT examination, but similar to the prior study and statistically  likely to represent cysts, measuring up to 1.9 x 1.6 cm between segments 5 and 8 (axial image 18 of series 2). Ill-defined low-attenuation adjacent to the falciform ligament, similar to prior studies, favored to represent focal fatty infiltration. Status post cholecystectomy. Pancreas: Status post distal pancreatectomy. Residual head and uncinate process of the pancreas are grossly unremarkable in appearance on today's noncontrast CT examination. Spleen: Status post splenectomy. Adrenals/Urinary Tract: Elongated calculus in the distal third of the right ureter (sagittal image 51 of series 6 and axial image 72 of series 2) measuring 2.4 x 0.4 x 0.6 cm. This is associated with moderate proximal right hydroureteronephrosis. No additional calculi are noted within the collecting system of either kidney, along the course of the left ureter or within the lumen of the urinary bladder. No left hydroureteronephrosis. In the upper pole of the right kidney there is a 3.0 cm low-attenuation lesion previously characterized as a simple cyst. Unenhanced appearance of the left kidney is unremarkable. Bilateral adrenal glands are normal in appearance. Urinary bladder is normal in appearance. Stomach/Bowel: Unenhanced appearance of the stomach is normal. There is a stent present extending from the distal duodenum into the proximal jejunum. Previously noted ill-defined soft tissue attenuation mass adjacent to the stent appears larger than the prior examination, currently measuring approximately 4.6 x 3.7 cm (axial image 28 of series 2). There is also a stent in the distal sigmoid colon, with some subtle surrounding inflammatory changes adjacent to the proximal aspect of the stent (best appreciated on axial image 64 of series 2). Notably, the proximal end of the stent appears to be impacted upon the lateral aspect of the sigmoid colon wall, best appreciated on axial image 66 of series 2. Proximal to the colonic stent there is dilatation  of both the colon and small bowel with multiple air-fluid levels. Small bowel dilatation measures up to 3.7 cm in diameter in the jejunum and up to 4.2 cm in diameter in the ileum indicative of obstruction. There is a paucity of stool in the distal sigmoid colon and rectum beyond the stent. Appendix is not confidently identified. Vascular/Lymphatic: Aortic atherosclerosis. No lymphadenopathy noted in the abdomen or pelvis. Reproductive: Prostate gland and seminal vesicles are unremarkable in appearance. Other: Trace volume of ascites.  No pneumoperitoneum. Musculoskeletal: There are no aggressive appearing lytic or blastic lesions noted in the visualized portions of the  skeleton. IMPRESSION: 1. Persistent bowel obstruction likely at the level of the proximal aspect of the stent in the sigmoid colon where there are some surrounding inflammatory changes. 2. Interval enlargement of a soft tissue attenuation mass adjacent to the duodenal/jejunal stent, concerning for neoplasm. 3. Widespread metastatic disease to the visualized lung bases, similar to the prior study. 4. Elongated calculus in the distal third of the right ureter with moderate proximal right hydroureteronephrosis, similar to the prior study. 5. There are calcifications of the aortic valve. Echocardiographic correlation for evaluation of potential valvular dysfunction may be warranted if clinically indicated. 6. Aortic atherosclerosis. 7. Additional incidental findings, as above. Electronically Signed   By: Vinnie Langton M.D.   On: 06/09/2021 13:09    Procedures Procedures   Medications Ordered in ED Medications  0.9 %  sodium chloride infusion (1,000 mLs Intravenous New Bag/Given 06/09/21 1441)  sodium chloride 0.9 % bolus 500 mL (0 mLs Intravenous Stopped 06/09/21 1407)  ondansetron (ZOFRAN) injection 4 mg (4 mg Intravenous Given 06/09/21 1305)  morphine 4 MG/ML injection 4 mg (4 mg Intravenous Given 06/09/21 1338)  LORazepam (ATIVAN) injection  1 mg (1 mg Intravenous Given 06/09/21 1534)  ondansetron (ZOFRAN) injection 4 mg (4 mg Intravenous Given 06/09/21 1850)  lidocaine (XYLOCAINE) 2 % viscous mouth solution 15 mL (15 mLs Mouth/Throat Given 06/09/21 1919)    ED Course  I have reviewed the triage vital signs and the nursing notes.  Pertinent labs & imaging results that were available during my care of the patient were reviewed by me and considered in my medical decision making (see chart for details).  80 year old with previous recurrent large bowel obstructions due to obstructed colonic stent presents back 4 days after being subsequently discharged for recurrent abdominal pain, nausea, vomiting.  He is not passing flatulence.  No bowel movement over the last 4 days.  He is afebrile, nonseptic, not ill-appearing.  His heart and lungs are clear.  He has hypoactive bowel sounds.  He is distended and has generalized tenderness.  States this feels similar to his prior bowel obstructions.  Per chart review they had offered him surgical intervention previously with a diverting surgery however patient declined at that time.  States he is more open to it currently.  We will plan on labs, imaging and reassess  Labs and imaging personally reviewed and interpreted:  CBC leukocytosis at 41.7 Metabolic panel with glucose 113, BUN 26, creatinine 1.48, no additional electrolyte, renal normality Lipase 33 CT AP WO with recurrent large bowel obstruction at level of stint with inflammatory changes  Discussed labs and results with patient and significant other in room.  Agreeable for admission.  We will touch base with general surgery, hospitalist team.  Patient Declined NG tube placement and is still having emesis. Will attempt additional antiemetics. He is feel very anxious. Will give Ativan  CONSULT with PA with general surgery. States will make Dr. Redmond Pulling aware of patient and general surgery will follow along.  CONSULT with Dr. Doristine Bosworth with [facility]  who agrees to accept patient in transfer for admission.  The patient appears reasonably stabilized for admission considering the current resources, flow, and capabilities available in the ED at this time, and I doubt any other [facility] requiring further screening and/or treatment in the ED prior to admission.    Nursing has informed me that patient with multiple episodes of emesis despite antiemetics here in ED. Patient now willing to attempt NG tube. Orders placed.   Attempt at NG  tube however patient could not tolerate and asked to terminate procedure.    Clinical Course as of 06/10/21 0844  Wed Jun 10, 6047  [id number] 80 year old male whose been admitted and colonic stented this month already is here back again with abdominal distention nausea.  He is diffusely tender.  CT showing continued obstruction at the level of the colonic stent.  He will need admission to the hospital for further evaluation by GI and general surgery. [MB]  2 Spoke with PA-C with general surgery. Will let Dr. Redmond Pulling know about patient [BH]    Clinical Course User Index [BH] Mitsuo Budnick A, PA-C [MB] Hayden Rasmussen, MD   MDM Rules/Calculators/A&P                           Final Clinical Impression(s) / ED Diagnoses Final diagnoses:  Large bowel obstruction ([place])  Intractable vomiting with nausea, unspecified vomiting type    Rx / DC Orders ED Discharge Orders     None        Amayiah Gosnell A, PA-C 06/09/21 1541    Wilhelmenia Addis A, PA-C 06/09/21 1928    Hayden Rasmussen, MD 06/09/21 1929    Hiawatha Merriott A, PA-C 06/10/21 0846    Hayden Rasmussen, MD 06/10/21 1213

## 2021-06-09 NOTE — ED Triage Notes (Signed)
Pt c/o constipation-pt states he had "a stent put in but it's not doing what it's suppose to do" 6/8-NAD-steady gait

## 2021-06-09 NOTE — Progress Notes (Signed)
Patient notifying the office that he's had a bad night with pain. He believes he's obstructed again. He is going to the [facility] ED. Dr Marin Olp given update.  Oncology Nurse Navigator Documentation  Oncology Nurse Navigator Flowsheets 06/09/2021  Navigator Location [facility]  Navigator Encounter Type Telephone  Telephone Patient Update;Incoming Call  Patient Visit Type MedOnc  Treatment Phase Active Tx  Barriers/Navigation Needs Coordination of Care;Education  Education -  Interventions Psycho-Social Support  Acuity Level 2-Minimal Needs (1-2 Barriers Identified)  Coordination of Care -  Education Method -  Support Groups/Services Friends and Family  Time Spent with Patient 15

## 2021-06-09 NOTE — ED Notes (Signed)
Attempted NGT placement. Patient unable to tolerate and refused to proceed. Removed tube and assessed patient. VS WNL. No signs of distress.

## 2021-06-09 NOTE — H&P (Signed)
History and Physical    Yedidya Duddy GYF:[id number] DOB: August 29, 1941 DOA: 06/09/2021  PCP: Dettinger, [name], MD  Patient coming from: [place] ED  I have personally briefly reviewed patient's old medical records in [place]  Chief Complaint: Abdominal pain, nausea, vomiting  HPI: Jeziel Hoffmann is a 80 y.o. male with medical history significant for metastatic pancreatic cancer, recurrent large bowel obstruction secondary to colonic stricture s/p sigmoid colonic stenting 05/26/2021, prior distal duodenal stricture stenting, CKD stage IIIa, moderate right hydroureteronephrosis with known distal right ureteral calculus who presented to [place] ED for evaluation of recurrent abdominal pain with nausea and vomiting.  Patient with 2 recent admissions for large bowel obstruction, initially from 05/18/2021-05/26/2021.  He underwent flexible sigmoidoscopy on 05/26/2021 at which time a sigmoid colon stent was placed for colonic stricture and he was discharged to home afterwards.  Readmitted 06/01/2021-06/05/2021 for recurrent large bowel obstruction.  He was managed conservatively with bowel regimen and dietary adjustment with eventual resolution of obstruction.  Patient states he has had recurrent abdominal pain, distention, and nausea with vomiting over the last several days.  He says his last bowel movement was on 6/18.  He has not passed any gas since last night.  He was having significant abdominal pain and emesis earlier today which is improved after receiving morphine and Zofran while in the ED.  He says that he is not currently taking any medications.  He otherwise denies any subjective fevers, chills, diaphoresis, chest pain, dyspnea, or dysuria.  [facility] ED Course:  Initial vitals showed BP 139/90, pulse 91, RR 20, temp 98.3 F, SPO2 96% on room air.  Labs show sodium 135, potassium 4.6, bicarb 25, BUN 26, creatinine 1.48, serum glucose 113, LFTs  within normal limits, WBC 13.8, hemoglobin 14.5, platelets 530,000, lipase 33, urinalysis negative for UTI.  SARS-CoV-2 PCR panel is negative.  CT abdomen/pelvis without contrast showed persistent bowel obstruction likely at the level of the proximal aspect of the stent in the sigmoid colon with some surrounding inflammatory changes.  Interval enlargement of a soft tissue attenuation mass adjacent to the duodenal/jejunal stent seen, concerning for neoplasm.  Widespread metastatic disease to the visualized lung bases noted similar to prior study.  Elongated calculus in the distal third right ureter measuring 2.4 x 0.4 x 0.6 cm again noted with similar appearing moderate proximal right hydroureteronephrosis.  Patient was given 500 cc normal saline, IV Zofran, IV Ativan, IV morphine.  EDP discussed with on-call general surgery who recommended medical admission and they will see in consultation.  NG tube placement was attempted in the ED however not placed due to tolerance.  The hospitalist service was consulted to admit for further evaluation and management.  Review of Systems: All systems reviewed and are negative except as documented in history of present illness above.   Past Medical History:  Diagnosis Date   Large bowel obstruction ([place])    Pancreatic cancer [facility])     Past Surgical History:  Procedure Laterality Date   ABDOMINAL SURGERY     BIOPSY  05/20/2021   Procedure: BIOPSY;  Surgeon: Otis Brace, MD;  Location: [facility] ENDOSCOPY;  Service: Gastroenterology;;   CHOLECYSTECTOMY N/A 12/16/2014   Procedure: LAPAROSCOPIC CHOLECYSTECTOMY WITH INTRAOPERATIVE CHOLANGIOGRAM;  Surgeon: Georganna Skeans, MD;  Location: [place];  Service: General;  Laterality: N/A;   COLONIC STENT PLACEMENT N/A 05/26/2021   Procedure: COLONIC STENT PLACEMENT;  Surgeon: Clarene Essex, MD;  Location: [facility]OS[facility];  Service: Endoscopy;  Laterality: N/A;   ESOPHAGOGASTRODUODENOSCOPY N/A 05/20/2021   Procedure:  ESOPHAGOGASTRODUODENOSCOPY (EGD);  Surgeon: Otis Brace, MD;  Location: [facility];  Service: Gastroenterology;  Laterality: N/A;   EUS N/A 09/24/2015   Procedure: UPPER ENDOSCOPIC ULTRASOUND (EUS) LINEAR;  Surgeon: Milus Banister, MD;  Location: [facility];  Service: Endoscopy;  Laterality: N/A;   FLEXIBLE SIGMOIDOSCOPY N/A 05/20/2021   Procedure: FLEXIBLE SIGMOIDOSCOPY;  Surgeon: Otis Brace, MD;  Location: [facility];  Service: Gastroenterology;  Laterality: N/A;   FLEXIBLE SIGMOIDOSCOPY N/A 05/26/2021   Procedure: FLEXIBLE SIGMOIDOSCOPY;  Surgeon: Clarene Essex, MD;  Location: [facility];  Service: Endoscopy;  Laterality: N/A;   VASECTOMY      Social History:  reports that he has never smoked. He has never used smokeless tobacco. He reports that he does not drink alcohol and does not use drugs.  Allergies  Allergen Reactions   Pollen Extract Other (See Comments)    Sinus congestion    Family History  Problem Relation Age of Onset   Stroke Mother      Prior to Admission medications   Medication Sig Start Date End Date Taking? Authorizing Provider  lidocaine-prilocaine (EMLA) cream Apply 1 application topically as needed. Port access 05/04/21   [provider]  Multiple Vitamin (MULTIVITAMIN WITH MINERALS) TABS tablet Take 1 tablet by mouth daily. 05/26/21   Georgette Shell, MD  ondansetron (ZOFRAN) 8 MG tablet Take 8 mg by mouth every 8 (eight) hours as needed for nausea or vomiting. 05/04/21   [provider]  polyethylene glycol (MIRALAX) 17 g packet Take 17 g by mouth 2 (two) times daily. 05/26/21   Georgette Shell, MD  prochlorperazine (COMPAZINE) 5 MG tablet Take 5 mg by mouth every 6 (six) hours as needed for nausea or vomiting. 05/04/21   [provider]  psyllium (HYDROCIL/METAMUCIL) 95 % PACK Take 1 packet by mouth 3 (three) times daily. 06/05/21 07/05/21  Mercy Riding, MD    Physical Exam: Vitals:   06/09/21 1415 06/09/21  1628 06/09/21 2000 06/09/21 2107  BP: (!) 138/91 139/87 (!) 137/104 (!) 138/99  Pulse: 69 78 88 90  Resp: 20 18 18 18   Temp:   98.5 F (36.9 C) 97.8 F (36.6 C)  TempSrc:   Oral Oral  SpO2: 98% 98% 96% 96%  Weight:      Height:       Constitutional: Resting supine in bed, NAD, calm, comfortable Eyes: PERRL, lids and conjunctivae normal ENMT: Mucous membranes are moist. Posterior pharynx clear of any exudate or lesions.Normal dentition.  Neck: normal, supple, no masses. Respiratory: clear to auscultation bilaterally, no wheezing, no crackles. Normal respiratory effort. No accessory muscle use.  Cardiovascular: Regular rate and rhythm, systolic murmur present. No extremity edema. 2+ pedal pulses. Abdomen: LLQ tenderness to palpation. No hepatosplenomegaly. Bowel sounds diminished.  Musculoskeletal: no clubbing / cyanosis. No joint deformity upper and lower extremities. Good ROM, no contractures. Normal muscle tone.  Skin: no rashes, lesions, ulcers. No induration Neurologic: CN 2-12 grossly intact. Sensation intact. Strength 5/5 in all 4.  Psychiatric: Normal judgment and insight. Alert and oriented x 3. Normal mood.   Labs on Admission: I have personally reviewed following labs and imaging studies  CBC: Recent Labs  Lab 06/03/21 0342 06/04/21 0458 06/09/21 1223  WBC 9.0 8.2 13.8*  NEUTROABS  --   --  9.8*  HGB 12.9* 12.4* 14.5  HCT 39.5 38.2* 43.3  MCV 98.0 98.5 95.8  PLT 454*  446* 629*   Basic Metabolic Panel: Recent Labs  Lab 06/03/21 0342 06/04/21 0458 06/05/21 0500 06/09/21 1223  NA 137 138 138 135  K 4.2 3.7 3.6 4.6  CL 106 109 109 102  CO2 24 23 23 25   GLUCOSE 137* 121* 86 113*  BUN 41* 24* 17 26*  CREATININE 1.73* 1.45* 1.40* 1.48*  CALCIUM 8.2* 8.1* 7.9* 9.1  MG 2.3 1.9  --   --   PHOS 2.2* 2.4* 2.3*  --    GFR: Estimated Creatinine Clearance: 39.7 mL/min (A) (by C-G formula based on SCr of 1.48 mg/dL (H)). Liver Function Tests: Recent Labs  Lab  06/09/21 1223  AST 33  ALT 31  ALKPHOS 78  BILITOT 0.4  PROT 7.4  ALBUMIN 3.6   Recent Labs  Lab 06/09/21 1223  LIPASE 33   No results for input(s): AMMONIA in the last 168 hours. Coagulation Profile: No results for input(s): INR, PROTIME in the last 168 hours. Cardiac Enzymes: No results for input(s): CKTOTAL, CKMB, CKMBINDEX, TROPONINI in the last 168 hours. BNP (last 3 results) No results for input(s): PROBNP in the last 8760 hours. HbA1C: No results for input(s): HGBA1C in the last 72 hours. CBG: No results for input(s): GLUCAP in the last 168 hours. Lipid Profile: No results for input(s): CHOL, HDL, LDLCALC, TRIG, CHOLHDL, LDLDIRECT in the last 72 hours. Thyroid Function Tests: No results for input(s): TSH, T4TOTAL, FREET4, T3FREE, THYROIDAB in the last 72 hours. Anemia Panel: No results for input(s): VITAMINB12, FOLATE, FERRITIN, TIBC, IRON, RETICCTPCT in the last 72 hours. Urine analysis:    Component Value Date/Time   COLORURINE YELLOW 06/09/2021 [place] 06/09/2021 1223   LABSPEC 1.025 06/09/2021 1223   PHURINE 5.5 06/09/2021 1223   GLUCOSEU NEGATIVE 06/09/2021 1223   HGBUR NEGATIVE 06/09/2021 1223   BILIRUBINUR NEGATIVE 06/09/2021 1223   KETONESUR NEGATIVE 06/09/2021 1223   PROTEINUR NEGATIVE 06/09/2021 1223   UROBILINOGEN 0.2 08/23/2015 1555   NITRITE NEGATIVE 06/09/2021 1223   [place] 06/09/2021 1223    Radiological Exams on Admission: CT Abdomen Pelvis Wo Contrast  Result Date: 06/09/2021 CLINICAL DATA:  80 year old male with history of acute onset of nonlocalized abdominal pain. History of pancreatic cancer. EXAM: CT ABDOMEN AND PELVIS WITHOUT CONTRAST TECHNIQUE: Multidetector CT imaging of the abdomen and pelvis was performed following the standard protocol without IV contrast. COMPARISON:  CT the abdomen pelvis 06/01/2021. FINDINGS: Lower chest: Severe calcifications of the aortic valve. Numerous pulmonary nodules noted  throughout the visualize lung bases, similar to the recent prior study, measuring up to 1.7 x 1.5 cm in the right lower lobe (axial image 2 of series 4) and 1.7 x 1.9 cm in the inferior aspect of the lingula (axial image 1 of series 4). Several of these pulmonary nodules demonstrate internal cavitation. Hepatobiliary: Multiple well-defined low-attenuation lesions are again noted in the liver, incompletely characterized on today's non-contrast CT examination, but similar to the prior study and statistically likely to represent cysts, measuring up to 1.9 x 1.6 cm between segments 5 and 8 (axial image 18 of series 2). Ill-defined low-attenuation adjacent to the falciform ligament, similar to prior studies, favored to represent focal fatty infiltration. Status post cholecystectomy. Pancreas: Status post distal pancreatectomy. Residual head and uncinate process of the pancreas are grossly unremarkable in appearance on today's noncontrast CT examination. Spleen: Status post splenectomy. Adrenals/Urinary Tract: Elongated calculus in the distal third of the right ureter (sagittal image 51 of series 6 and axial  image 72 of series 2) measuring 2.4 x 0.4 x 0.6 cm. This is associated with moderate proximal right hydroureteronephrosis. No additional calculi are noted within the collecting system of either kidney, along the course of the left ureter or within the lumen of the urinary bladder. No left hydroureteronephrosis. In the upper pole of the right kidney there is a 3.0 cm low-attenuation lesion previously characterized as a simple cyst. Unenhanced appearance of the left kidney is unremarkable. Bilateral adrenal glands are normal in appearance. Urinary bladder is normal in appearance. Stomach/Bowel: Unenhanced appearance of the stomach is normal. There is a stent present extending from the distal duodenum into the proximal jejunum. Previously noted ill-defined soft tissue attenuation mass adjacent to the stent appears larger  than the prior examination, currently measuring approximately 4.6 x 3.7 cm (axial image 28 of series 2). There is also a stent in the distal sigmoid colon, with some subtle surrounding inflammatory changes adjacent to the proximal aspect of the stent (best appreciated on axial image 64 of series 2). Notably, the proximal end of the stent appears to be impacted upon the lateral aspect of the sigmoid colon wall, best appreciated on axial image 66 of series 2. Proximal to the colonic stent there is dilatation of both the colon and small bowel with multiple air-fluid levels. Small bowel dilatation measures up to 3.7 cm in diameter in the jejunum and up to 4.2 cm in diameter in the ileum indicative of obstruction. There is a paucity of stool in the distal sigmoid colon and rectum beyond the stent. Appendix is not confidently identified. Vascular/Lymphatic: Aortic atherosclerosis. No lymphadenopathy noted in the abdomen or pelvis. Reproductive: Prostate gland and seminal vesicles are unremarkable in appearance. Other: Trace volume of ascites.  No pneumoperitoneum. Musculoskeletal: There are no aggressive appearing lytic or blastic lesions noted in the visualized portions of the skeleton. IMPRESSION: 1. Persistent bowel obstruction likely at the level of the proximal aspect of the stent in the sigmoid colon where there are some surrounding inflammatory changes. 2. Interval enlargement of a soft tissue attenuation mass adjacent to the duodenal/jejunal stent, concerning for neoplasm. 3. Widespread metastatic disease to the visualized lung bases, similar to the prior study. 4. Elongated calculus in the distal third of the right ureter with moderate proximal right hydroureteronephrosis, similar to the prior study. 5. There are calcifications of the aortic valve. Echocardiographic correlation for evaluation of potential valvular dysfunction may be warranted if clinically indicated. 6. Aortic atherosclerosis. 7. Additional  incidental findings, as above. Electronically Signed   By: Vinnie Langton M.D.   On: 06/09/2021 13:09    EKG: Not performed.  Assessment/Plan Principal Problem:   Large bowel obstruction (HCC) Active Problems:   Malignant neoplasm of tail of pancreas (HCC)   CKD (chronic kidney disease), stage III (HCC)   Serigne Kubicek is a 80 y.o. male with medical history significant for metastatic pancreatic cancer, recurrent large bowel obstruction secondary to colonic stricture s/p sigmoid colonic stenting 05/26/2021, prior distal duodenal stricture stenting, CKD stage IIIa, moderate right hydroureteronephrosis with known distal right ureteral calculus who is admitted with recurrent/persistent large bowel obstruction.  Recurrent/persistent large bowel obstruction in patient with history of large bowel obstruction due to colonic stricture s/p sigmoid colonic stenting 05/26/2021 Hx duodenal stricture s/p distal duodenal stenting 11/2019: Recurrent/persistent bowel obstruction seen at the level of the proximal sigmoid colonic stent.  Soft tissue mass adjacent to the stent appears larger than prior exam.  Last BM 4 days ago, has not  passed gas since last night.  Abdominal pain and nausea/vomiting somewhat improving.  NG tube placement attempted in ED but not completed due to patient intolerance. -Keep n.p.o. -Start IV fluid hydration with LR@100  mL/hour overnight -Continue analgesics and antiemetics as needed -Will need further general surgery and GI consultations  CKD stage IIIa: Currently stable.  Continue to monitor.  Unchanged moderate right hydroureteronephrosis with distal right ureteral calculus: Stable, reports good urine output.  Follow-up with urology as an outpatient.  Metastatic pancreatic cancer with duodenal, lung, and ureteral metastases: Follows with oncology, Dr. Jorje Guild through [facility] and Dr. Marin Olp through [facility].  Was started on gemcitabine/Abraxane chemotherapy on  05/05/21, has not received further infusions due to frequent hospitalization.  DVT prophylaxis: Subcutaneous heparin Code Status: Full code, confirmed with patient Family Communication: Discussed with patient, he has discussed with family Disposition Plan: From home and likely discharge to home pending clinical progress Consults called: General surgery Level of care: Med-Surg Admission status:  Status is: Inpatient  Remains inpatient appropriate because:IV treatments appropriate due to intensity of illness or inability to take PO and Inpatient level of care appropriate due to severity of illness  Dispo: The patient is from: Home              Anticipated d/c is to: Home              Patient currently is not medically stable to d/c.   Difficult to place patient No    Zada Finders MD [facility]  If 7PM-7AM, please contact night-coverage [email or web address]  06/09/2021, 9:47 PM

## 2021-06-09 NOTE — ED Notes (Signed)
Pt states he had a stent in his colon on June 8 and cleaned  Him out  Well states last BM was Sunday , states he spoke to [facility] and he is aware that pt is here , states willprobly need too have surgery agagin , states  Doesn't want any more  Mag citrate

## 2021-06-09 NOTE — Telephone Encounter (Signed)
Called patient to let him know we aren't able to get him in today, but he can come in the morning and see Britney, but he said he is already headed to the ER. He feels that he has a blockage again. He will keep [place] updated.

## 2021-06-09 NOTE — Care Plan (Signed)
80 year old M with PMH of pancreatic cancer, large bowel obstruction due to colonic stricture s/p sigmoid colonic stent was recently admitted for large bowel obstruction proximal to sigmoid colon stent.  GI and general surgery consulted.  Patient was managed conservatively with bowel regimen and dietary adjustments.  Eventually, the obstruction resolved.  He tolerated soft diet and had regular bowel movements.  He was cleared for discharge by general surgery and GI for outpatient follow-up.  He was discharged on 06/05/2021.  He did not MCHP today with complaining of not being able to pass gas and abdominal pain and yet once again was diagnosed with small bowel obstruction.  ED provider discussed the case with Jinny Blossom, [place] with general surgery who recommended admission to hospital service and they will follow along as consulting service.  According to ED provider, patient was given surgical option to avoid future large bowel obstruction but he was reluctant adamant that this is going to be his third admission in a month, he is more amenable to consider that.  Patient is excepted to be admitted to medicine on hospital.  Please inform general surgery of his arrival when he arrives here.  Patient will be considered under the care of ED physician until he arrives at [facility] and then [facility] will take over.  [facility] will be available for any questions 24/7.

## 2021-06-10 DIAGNOSIS — C252 Malignant neoplasm of tail of pancreas: Secondary | ICD-10-CM

## 2021-06-10 DIAGNOSIS — N289 Disorder of kidney and ureter, unspecified: Secondary | ICD-10-CM

## 2021-06-10 DIAGNOSIS — K56609 Unspecified intestinal obstruction, unspecified as to partial versus complete obstruction: Secondary | ICD-10-CM

## 2021-06-10 DIAGNOSIS — I700 Atherosclerosis of aorta: Secondary | ICD-10-CM | POA: Diagnosis present

## 2021-06-10 DIAGNOSIS — N134 Hydroureter: Secondary | ICD-10-CM

## 2021-06-10 DIAGNOSIS — N201 Calculus of ureter: Secondary | ICD-10-CM

## 2021-06-10 LAB — CBC
HCT: 41.7 % (ref 39.0–52.0)
Hemoglobin: 13.3 g/dL (ref 13.0–17.0)
MCH: 31.6 pg (ref 26.0–34.0)
MCHC: 31.9 g/dL (ref 30.0–36.0)
MCV: 99 fL (ref 80.0–100.0)
Platelets: 524 10*3/uL — ABNORMAL HIGH (ref 150–400)
RBC: 4.21 MIL/uL — ABNORMAL LOW (ref 4.22–5.81)
RDW: 14.7 % (ref 11.5–15.5)
WBC: 13.9 10*3/uL — ABNORMAL HIGH (ref 4.0–10.5)
nRBC: 0 % (ref 0.0–0.2)

## 2021-06-10 LAB — BASIC METABOLIC PANEL
Anion gap: 9 (ref 5–15)
BUN: 35 mg/dL — ABNORMAL HIGH (ref 8–23)
CO2: 25 mmol/L (ref 22–32)
Calcium: 8.9 mg/dL (ref 8.9–10.3)
Chloride: 103 mmol/L (ref 98–111)
Creatinine, Ser: 1.51 mg/dL — ABNORMAL HIGH (ref 0.61–1.24)
GFR, Estimated: 47 mL/min — ABNORMAL LOW (ref >60)
Glucose, Bld: 117 mg/dL — ABNORMAL HIGH (ref 70–99)
Potassium: 5.1 mmol/L (ref 3.5–5.1)
Sodium: 137 mmol/L (ref 135–145)

## 2021-06-10 MED ORDER — HYDROMORPHONE HCL 1 MG/ML IJ SOLN
0.5000 mg | INTRAMUSCULAR | Status: DC | PRN
  Administered 2021-06-10 – 2021-06-13 (×4): 0.5 mg via INTRAVENOUS
  Filled 2021-06-10 (×5): qty 0.5

## 2021-06-10 MED ORDER — SODIUM CHLORIDE 0.9 % IV SOLN: INTRAVENOUS | Status: DC

## 2021-06-10 MED ORDER — ORAL CARE MOUTH RINSE
15.0000 mL | Freq: Two times a day (BID) | OROMUCOSAL | Status: DC
  Administered 2021-06-10 – 2021-06-13 (×3): 15 mL via OROMUCOSAL

## 2021-06-10 MED ORDER — CHLORHEXIDINE GLUCONATE 0.12 % MT SOLN
15.0000 mL | Freq: Two times a day (BID) | OROMUCOSAL | Status: DC
  Administered 2021-06-10 – 2021-06-14 (×6): 15 mL via OROMUCOSAL
  Filled 2021-06-10 (×7): qty 15

## 2021-06-10 MED ORDER — POTASSIUM CHLORIDE IN NACL 20-0.9 MEQ/L-% IV SOLN: INTRAVENOUS | Status: DC

## 2021-06-10 NOTE — Assessment & Plan Note (Addendum)
--  d/w Dr. Junious Silk, follow-up with urology as outpatient no stent for now, can add a lot of morbidity (pain, UTI, bleeding). Shockwave therapy would be considered --creatinine much improved

## 2021-06-10 NOTE — Assessment & Plan Note (Addendum)
--   Status post surgery with removal of stent.

## 2021-06-10 NOTE — Assessment & Plan Note (Addendum)
--  Followed by Dr. Jorje Guild through [facility] and Dr. Marin Olp through [facility].  Was started on gemcitabine/Abraxane chemotherapy on 05/05/21, has not received further infusions due to frequent hospitalization.

## 2021-06-10 NOTE — Progress Notes (Signed)
Initial Nutrition Assessment  DOCUMENTATION CODES:   Severe malnutrition in context of chronic illness  INTERVENTION:  - will monitor for plan concerning nutrition.    NUTRITION DIAGNOSIS:   Severe Malnutrition related to chronic illness, cancer and cancer related treatments as evidenced by moderate fat depletion, moderate muscle depletion, severe muscle depletion.  GOAL:   Patient will meet greater than or equal to 90% of their needs  MONITOR:   Diet advancement, Labs, Weight trends  REASON FOR ASSESSMENT:   Malnutrition Screening Tool  ASSESSMENT:   80 year old male with medical history of metastatic pancreatic cancer, recurrent large bowel obstructions, s/p colonic stent. He presented to the ED due to abdominal pain and was admitted for recurrent large bowel obstruction.  He has been NPO since admission. Patient known to this RD from previous admission (seen on 6/15). Since discharge from that admission, PO intake has remained very minimal with ongoing early satiety, nausea, and abdominal pressure.   Weight today is 155 lb and weight on 04/28/21 was 164 lb. This indicates 9 lb weight loss (5.5% body weight) in the past 5 weeks.   NGT was attempted to be placed but was unsuccessful  Per notes: - large bowel obstruction 2/2 colonic stricture - pancreatic cancer with mets with last chemo on 05/05/21 d/t recurrent hospitalizations - R hydroureter  - Surgery following   Labs reviewed; BUN: 35 mg/dl, creatinine: 1.51 mg/dl, GFR: 47 ml/min. Medications reviewed.     NUTRITION - FOCUSED PHYSICAL EXAM:  Flowsheet Row Most Recent Value  Orbital Region Moderate depletion  Upper Arm Region Moderate depletion  Thoracic and Lumbar Region Unable to assess  Buccal Region Severe depletion  Temple Region Moderate depletion  Clavicle Bone Region Severe depletion  Clavicle and Acromion Bone Region Severe depletion  Scapular Bone Region Severe depletion  Dorsal Hand Moderate  depletion  Patellar Region Severe depletion  Anterior Thigh Region Moderate depletion  Posterior Calf Region Severe depletion  Edema (RD Assessment) None  Hair Reviewed  Eyes Reviewed  Mouth Reviewed  Skin Reviewed  Nails Reviewed       Diet Order:   Diet Order             Diet NPO time specified  Diet effective now                   EDUCATION NEEDS:   Not appropriate for education at this time  Skin:  Skin Assessment: Reviewed RN Assessment  Last BM:  PTA/unknown  Height:   Ht Readings from Last 1 Encounters:  06/09/21 5\' 11"  (1.803 m)    Weight:   Wt Readings from Last 1 Encounters:  06/10/21 70.3 kg     Estimated Nutritional Needs:  Kcal:  2100-2350 kcal Protein:  110-130 grams Fluid:  >/= 2.3 L/day      Jarome Matin, MS, RD, LDN, CNSC Inpatient Clinical Dietitian RD pager # available in AMION  After hours/weekend pager # available in [facility]

## 2021-06-10 NOTE — Consult Note (Signed)
B]Referring Provider: Dr. Raiford Noble Primary Care Physician:  Dettinger, [name], MD Primary Gastroenterologist:  [facility] (Dr. Watt Climes)  Reason for Consultation:  Large bowel obstruction  HPI: Kevin Villegas is a 80 y.o. male with past medical history of metastatic pancreatic adenocarcinoma s/p distal pancreatectomy, splenectomy, partial gastrectomy, and chemotherapy, colonic stricture s/p stent placement 05/26/21, duodenal stenosis s/p stenting in 11/2019 presenting for consultation of large bowel obstruction.  Patient presented to [place] ED yesterday with worsening obstructive symtpoms and was transferred to [facility]  [place] well-known to our service with prior large bowel obstruction s/p stent placement 05/26/21.  He was last hospitalized from 6/14-6/18 and his symptoms improved on a bowel regimen. However, since discharge, he has not had any more bowel movements and stated having worsening nausea/vomiting. He states he has not been able to eat or drink anything since Monday 6/20.  CT 06/09/21: Persistent bowel obstruction likely at the level of the proximal aspect of the stent in the sigmoid colon where there are some surrounding inflammatory changes.  Interval enlargement of a soft tissue attenuation mass adjacent to the duodenal/jejunal stent, concerning for neoplasm.  Widespread metastatic disease to the visualized lung bases, similar to the prior study.   Past Medical History:  Diagnosis Date   Large bowel obstruction ([place])    Pancreatic cancer [facility])     Past Surgical History:  Procedure Laterality Date   ABDOMINAL SURGERY     BIOPSY  05/20/2021   Procedure: BIOPSY;  Surgeon: Otis Brace, MD;  Location: [facility];  Service: Gastroenterology;;   CHOLECYSTECTOMY N/A 12/16/2014   Procedure: LAPAROSCOPIC CHOLECYSTECTOMY WITH INTRAOPERATIVE CHOLANGIOGRAM;  Surgeon: Georganna Skeans, MD;  Location: [place];  Service: General;  Laterality: N/A;   COLONIC STENT  PLACEMENT N/A 05/26/2021   Procedure: COLONIC STENT PLACEMENT;  Surgeon: Clarene Essex, MD;  Location: [facility];  Service: Endoscopy;  Laterality: N/A;   ESOPHAGOGASTRODUODENOSCOPY N/A 05/20/2021   Procedure: ESOPHAGOGASTRODUODENOSCOPY (EGD);  Surgeon: Otis Brace, MD;  Location: [facility];  Service: Gastroenterology;  Laterality: N/A;   EUS N/A 09/24/2015   Procedure: UPPER ENDOSCOPIC ULTRASOUND (EUS) LINEAR;  Surgeon: Milus Banister, MD;  Location: [facility];  Service: Endoscopy;  Laterality: N/A;   FLEXIBLE SIGMOIDOSCOPY N/A 05/20/2021   Procedure: FLEXIBLE SIGMOIDOSCOPY;  Surgeon: Otis Brace, MD;  Location: [facility];  Service: Gastroenterology;  Laterality: N/A;   FLEXIBLE SIGMOIDOSCOPY N/A 05/26/2021   Procedure: FLEXIBLE SIGMOIDOSCOPY;  Surgeon: Clarene Essex, MD;  Location: [facility];  Service: Endoscopy;  Laterality: N/A;   VASECTOMY      Prior to Admission medications   Medication Sig Start Date End Date Taking? Authorizing Provider  dicyclomine (BENTYL) 20 MG tablet Take 0.5 tablets (10 mg total) by mouth every 6 (six) hours as needed (For abdominal cramping). 05/16/21  Yes Molpus, John, MD  lidocaine-prilocaine (EMLA) cream Apply 1 application topically as needed. Port access 05/04/21  Yes [provider]  ondansetron (ZOFRAN) 8 MG tablet Take 8 mg by mouth every 8 (eight) hours as needed for nausea or vomiting. 05/04/21  Yes [provider]  prochlorperazine (COMPAZINE) 5 MG tablet Take 5 mg by mouth every 6 (six) hours as needed for nausea or vomiting. 05/04/21  Yes [provider]    Scheduled Meds:  Chlorhexidine Gluconate Cloth  6 each Topical Daily   heparin  5,000 Units Subcutaneous Q8H   sodium chloride flush  10-40 mL Intracatheter Q12H   Continuous Infusions:  sodium chloride 1,000 mL (06/09/21 1441)  lactated ringers 100 mL/hr at 06/09/21 2328   PRN Meds:.sodium chloride, HYDROmorphone (DILAUDID) injection, ondansetron  **OR** ondansetron (ZOFRAN) IV, sodium chloride flush  Allergies as of 06/09/2021 - Review Complete 06/09/2021  Allergen Reaction Noted   Pollen extract Other (See Comments) 09/19/2017    Family History  Problem Relation Age of Onset   Stroke Mother     Social History   Socioeconomic History   Marital status: Married    Spouse name: Not on file   Number of children: 2   Years of education: Not on file   Highest education level: Not on file  Occupational History   Occupation: retired  Tobacco Use   Smoking status: Never   Smokeless tobacco: Never  Vaping Use   Vaping Use: Never used  Substance and Sexual Activity   Alcohol use: No    Alcohol/week: 0.0 standard drinks   Drug use: No   Sexual activity: Yes  Other Topics Concern   Not on file  Social History Narrative   Not on file   Social Determinants of Health   Financial Resource Strain: Not on file  Food Insecurity: Not on file  Transportation Needs: Not on file  Physical Activity: Not on file  Stress: Not on file  Social Connections: Not on file  Intimate Partner Violence: Not on file    Review of Systems: Review of Systems  Constitutional:  Negative for chills and fever.  HENT:  Negative for hearing loss and tinnitus.   Eyes:  Negative for pain and redness.  Respiratory:  Negative for cough and shortness of breath.   Cardiovascular:  Negative for chest pain and palpitations.  Gastrointestinal:  Positive for abdominal pain, constipation, nausea and vomiting. Negative for blood in stool, diarrhea, heartburn and melena.  Genitourinary:  Negative for flank pain and hematuria.  Musculoskeletal:  Negative for falls and joint pain.  Skin:  Negative for itching and rash.  Neurological:  Negative for seizures and loss of consciousness.  Endo/Heme/Allergies:  Negative for polydipsia. Does not bruise/bleed easily.  Psychiatric/Behavioral:  Negative for substance abuse. The patient is not nervous/anxious.      Physical Exam: Vital signs: Vitals:   06/10/21 0458 06/10/21 0835  BP: (!) 145/96 (!) 147/95  Pulse: 79 78  Resp: 17 19  Temp: 98.3 F (36.8 C) 98.2 F (36.8 C)  SpO2: 100% 97%      Physical Exam Vitals reviewed.  Constitutional:      General: He is not in acute distress. HENT:     Head: Normocephalic and atraumatic.     Nose: Nose normal. No congestion.     Mouth/Throat:     Mouth: Mucous membranes are moist.     Pharynx: Oropharynx is clear.  Eyes:     General: No scleral icterus.    Extraocular Movements: Extraocular movements intact.     Conjunctiva/sclera: Conjunctivae normal.  Cardiovascular:     Rate and Rhythm: Normal rate and regular rhythm.  Pulmonary:     Effort: Pulmonary effort is normal. No respiratory distress.  Abdominal:     General: Bowel sounds are decreased. There is distension.     Palpations: There is no mass.     Tenderness: There is generalized abdominal tenderness (mild). There is no guarding or rebound.     Hernia: No hernia is present.  Musculoskeletal:        General: No swelling or tenderness.     Cervical back: Normal range of motion and neck supple.  Skin:  General: Skin is warm and dry.  Neurological:     General: No focal deficit present.     Mental Status: He is alert and oriented to person, place, and time.  Psychiatric:        Mood and Affect: Mood normal.        Behavior: Behavior normal.    GI:  Lab Results: Recent Labs    06/09/21 1223 06/10/21 0350  WBC 13.8* 13.9*  HGB 14.5 13.3  HCT 43.3 41.7  PLT 530* 524*    BMET Recent Labs    06/09/21 1223 06/10/21 0350  NA 135 137  K 4.6 5.1  CL 102 103  CO2 25 25  GLUCOSE 113* 117*  BUN 26* 35*  CREATININE 1.48* 1.51*  CALCIUM 9.1 8.9    LFT Recent Labs    06/09/21 1223  PROT 7.4  ALBUMIN 3.6  AST 33  ALT 31  ALKPHOS 78  BILITOT 0.4    PT/INR No results for input(s): LABPROT, INR in the last 72 hours.   Studies/Results: CT Abdomen  Pelvis Wo Contrast  Result Date: 06/09/2021 CLINICAL DATA:  80 year old male with history of acute onset of nonlocalized abdominal pain. History of pancreatic cancer. EXAM: CT ABDOMEN AND PELVIS WITHOUT CONTRAST TECHNIQUE: Multidetector CT imaging of the abdomen and pelvis was performed following the standard protocol without IV contrast. COMPARISON:  CT the abdomen pelvis 06/01/2021. FINDINGS: Lower chest: Severe calcifications of the aortic valve. Numerous pulmonary nodules noted throughout the visualize lung bases, similar to the recent prior study, measuring up to 1.7 x 1.5 cm in the right lower lobe (axial image 2 of series 4) and 1.7 x 1.9 cm in the inferior aspect of the lingula (axial image 1 of series 4). Several of these pulmonary nodules demonstrate internal cavitation. Hepatobiliary: Multiple well-defined low-attenuation lesions are again noted in the liver, incompletely characterized on today's non-contrast CT examination, but similar to the prior study and statistically likely to represent cysts, measuring up to 1.9 x 1.6 cm between segments 5 and 8 (axial image 18 of series 2). Ill-defined low-attenuation adjacent to the falciform ligament, similar to prior studies, favored to represent focal fatty infiltration. Status post cholecystectomy. Pancreas: Status post distal pancreatectomy. Residual head and uncinate process of the pancreas are grossly unremarkable in appearance on today's noncontrast CT examination. Spleen: Status post splenectomy. Adrenals/Urinary Tract: Elongated calculus in the distal third of the right ureter (sagittal image 51 of series 6 and axial image 72 of series 2) measuring 2.4 x 0.4 x 0.6 cm. This is associated with moderate proximal right hydroureteronephrosis. No additional calculi are noted within the collecting system of either kidney, along the course of the left ureter or within the lumen of the urinary bladder. No left hydroureteronephrosis. In the upper pole of the  right kidney there is a 3.0 cm low-attenuation lesion previously characterized as a simple cyst. Unenhanced appearance of the left kidney is unremarkable. Bilateral adrenal glands are normal in appearance. Urinary bladder is normal in appearance. Stomach/Bowel: Unenhanced appearance of the stomach is normal. There is a stent present extending from the distal duodenum into the proximal jejunum. Previously noted ill-defined soft tissue attenuation mass adjacent to the stent appears larger than the prior examination, currently measuring approximately 4.6 x 3.7 cm (axial image 28 of series 2). There is also a stent in the distal sigmoid colon, with some subtle surrounding inflammatory changes adjacent to the proximal aspect of the stent (best appreciated on axial image 64 of  series 2). Notably, the proximal end of the stent appears to be impacted upon the lateral aspect of the sigmoid colon wall, best appreciated on axial image 66 of series 2. Proximal to the colonic stent there is dilatation of both the colon and small bowel with multiple air-fluid levels. Small bowel dilatation measures up to 3.7 cm in diameter in the jejunum and up to 4.2 cm in diameter in the ileum indicative of obstruction. There is a paucity of stool in the distal sigmoid colon and rectum beyond the stent. Appendix is not confidently identified. Vascular/Lymphatic: Aortic atherosclerosis. No lymphadenopathy noted in the abdomen or pelvis. Reproductive: Prostate gland and seminal vesicles are unremarkable in appearance. Other: Trace volume of ascites.  No pneumoperitoneum. Musculoskeletal: There are no aggressive appearing lytic or blastic lesions noted in the visualized portions of the skeleton. IMPRESSION: 1. Persistent bowel obstruction likely at the level of the proximal aspect of the stent in the sigmoid colon where there are some surrounding inflammatory changes. 2. Interval enlargement of a soft tissue attenuation mass adjacent to the  duodenal/jejunal stent, concerning for neoplasm. 3. Widespread metastatic disease to the visualized lung bases, similar to the prior study. 4. Elongated calculus in the distal third of the right ureter with moderate proximal right hydroureteronephrosis, similar to the prior study. 5. There are calcifications of the aortic valve. Echocardiographic correlation for evaluation of potential valvular dysfunction may be warranted if clinically indicated. 6. Aortic atherosclerosis. 7. Additional incidental findings, as above. Electronically Signed   By: Vinnie Langton M.D.   On: 06/09/2021 13:09     Impression: Colonic obstruction: CT yesterday revealed persistent bowel obstruction likely at the level of the proximal aspect of the stent in the sigmoid colon where there are some surrounding inflammatory changes.   -Mild leukocytosis (WBCs 13.9)  Metastatic pancreatic adenocarcinoma s/p distal pancreatectomy, splenectomy, partial gastrectomy, and chemotherapy, duodenal stenosis s/p stenting in 11/2019  -CT yesterday revealed interval enlargement of a soft tissue attenuation mass adjacent to the duodenal/jejunal stent, concerning for neoplasm.  Widespread metastatic disease to the visualized lung bases, similar to the prior study.  Plan: Recommend surgical consultation, as patient would benefit from surgical management at this time.  Stent adjustment or replacement would increase risk of bowel perforation and thus is not currently feasible.  Await surgical recommendations. We will be on standby for pre-operative evaluation as needed.    [facility] will follow at a distance.   LOS: 1 day   Salley Slaughter  PA-C 06/10/2021, 9:00 AM  Contact #  [phone]

## 2021-06-10 NOTE — Assessment & Plan Note (Signed)
--  no treatment indicated °

## 2021-06-10 NOTE — Progress Notes (Signed)
PROGRESS NOTE  Om Lizotte EXB:[id number] DOB: [birth year] DOA: 06/09/2021 PCP: Dettinger, [name], MD  Brief History   80 year old man PMH metastatic pancreatic cancer, recurrent large bowel obstructions, status post colonic stent, presenting with abdominal pain.  Admitted for recurrent large bowel obstruction.  Followed by gastroenterology and general surgery.  Will need surgery this hospitalization.  A & P  * Large bowel obstruction (HCC) secondary to colonic stricture --Keep NPO.  Appreciate general surgery and gastroenterology.  Likely needs surgery.  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 Jorje Guild through [facility] and Dr. Marin Olp through [facility].  Was started on gemcitabine/Abraxane chemotherapy on 05/05/21, has not received further infusions due to frequent hospitalization.  Renal insufficiency --had one normal creatinine in May; this may be more acute secondary to hydroureter, though creatinine stable over last month. Will discuss w/ urology  Aortic atherosclerosis ([place]) --no treatment indicated  Hydroureter on right --follow-up with urology as outpatient  Disposition Plan:  Discussion:   Status is: Inpatient  Remains inpatient appropriate because:IV treatments appropriate due to intensity of illness or inability to take PO and Inpatient level of care appropriate due to severity of illness  Dispo: The patient is from: Home              Anticipated d/c is to: Home              Patient currently is not medically stable to d/c.   Difficult to place patient No  DVT prophylaxis: heparin injection 5,000 Units Start: 06/09/21 2300   Code Status: Full Code Level of care: Med-Surg Family Communication: none  Murray Hodgkins, MD  [facility] Direct contact: see [email or web address] (further directions at bottom of note if needed) 7PM-7AM contact night coverage as at bottom  of note 06/10/2021, 3:12 PM  LOS: 1 day   Significant Hospital Events   6/22 admit for large bowel obstruction   Consults:  GI General surgery   Procedures:    Micro Data:     Antimicrobials:    Interval History/Subjective  CC: f/u bowel obstruction  No BM, no flatus, feels ok  Review of Systems  Respiratory:  Negative for shortness of breath.   Cardiovascular:  Negative for chest pain.  Gastrointestinal:  Positive for abdominal pain (generalized), nausea and vomiting (yesterday).   Objective   Vitals:  Vitals:   06/10/21 0835 06/10/21 1324  BP: (!) 147/95 (!) 127/94  Pulse: 78 90  Resp: 19 19  Temp: 98.2 F (36.8 C) 97.7 F (36.5 C)  SpO2: 97% 95%    Exam: Physical Exam Vitals and nursing note reviewed.  Constitutional:      General: He is not in acute distress.    Appearance: Normal appearance. He is not ill-appearing or toxic-appearing.  Cardiovascular:     Rate and Rhythm: Normal rate and regular rhythm.     Heart sounds: Murmur (holosystolic RUSB) heard.    No gallop.  Pulmonary:     Effort: Pulmonary effort is normal. No respiratory distress.     Breath sounds: Normal breath sounds. No wheezing or rales.  Abdominal:     General: There is no distension.     Tenderness: There is no abdominal tenderness. There is no guarding.  Neurological:     Mental Status: He is alert.  Psychiatric:        Mood and Affect: Mood normal.  Behavior: Behavior normal.    I have personally reviewed the labs and other data, making special note of:   Today's Data  Creatinine 1.51 WBC 13.9   Scheduled Meds:  chlorhexidine  15 mL Mouth Rinse BID   Chlorhexidine Gluconate Cloth  6 each Topical Daily   heparin  5,000 Units Subcutaneous Q8H   mouth rinse  15 mL Mouth Rinse q12n4p   sodium chloride flush  10-40 mL Intracatheter Q12H   Continuous Infusions:  sodium chloride 1,000 mL (06/09/21 1441)    Principal Problem:   Large bowel obstruction (HCC)  secondary to colonic stricture Active Problems:   Malignant neoplasm of tail of pancreas (HCC) with metastatic disease   Colon stricture (HCC)   Renal insufficiency   Right ureteral stone   Hydroureter on right   Aortic atherosclerosis ([place])   LOS: 1 day   How to contact the [facility] Attending or Consulting provider 7A - 7P or covering provider during after hours [place], for this patient?  Check the care team in [facility] and look for a) attending/consulting [facility] provider listed and b) the [facility] team listed Log into [email or web address] and use [place]'s universal password to access. If you do not have the password, please contact the hospital operator. Locate the [facility] provider you are looking for under [facility] and page to a number that you can be directly reached. If you still have difficulty reaching the provider, please page the [facility] (Director on Call) for the Hospitalists listed on amion for assistance.

## 2021-06-10 NOTE — Assessment & Plan Note (Addendum)
--   Mild postoperatively.  IV fluids and clear liquids, repeat BMP in AM.

## 2021-06-10 NOTE — Consult Note (Signed)
@LOGO @ Hematology and Oncology Follow Up Visit  Kevin Villegas [id number] 03/24/1941 80 y.o. 06/10/2021   Principle Diagnosis:  Recurrent large bowel obstruction; pancreatic cancer  Current Therapy:   Patient mated with another bowel obstruction.     Interim History:  Kevin Villegas is is well-known to me.  He is a 80 year old white male.  He was just discharged from the hospital last Saturday.  He now comes back in with a recurrent bowel obstruction.  This is his third admission for bowel obstruction.  He had a CT of the abdomen yesterday.  This showed a persistent bowel obstruction at the level of the proximal stent in the sigmoid colon.  He has not yet been seen by surgery.  At this point, I do not see any other option for him but surgery to bypass the obstruction.  He presented with nausea and vomiting.  He has not had a bowel movement for several days.  There has been no bleeding.  He has had no fever.  Lab work shows white cell count 13.9.  Hemoglobin 13.3.  Platelet count 524,000.  His sodium is 137.  Potassium 5.1.  BUN 35 creatinine 1.51.  He is still in good shape.Marland Kitchen  He has not had chemotherapy for several weeks.  Overall, his performance status is ECOG 1.   Medications:  Current Facility-Administered Medications:    0.9 %  sodium chloride infusion, , Intravenous, PRN, Hayden Rasmussen, MD, Last Rate: 10 mL/hr at 06/09/21 1441, 1,000 mL at 06/09/21 1441   Chlorhexidine Gluconate Cloth 2 % PADS 6 each, 6 each, Topical, Daily, Posey Pronto, Vishal R, MD   heparin injection 5,000 Units, 5,000 Units, Subcutaneous, Q8H, Lenore Cordia, MD, 5,000 Units at 06/09/21 2329   HYDROmorphone (DILAUDID) injection 1 mg, 1 mg, Intravenous, Q3H PRN, Lenore Cordia, MD   lactated ringers infusion, , Intravenous, Continuous, Lenore Cordia, MD, Last Rate: 100 mL/hr at 06/09/21 2328, New Bag at 06/09/21 2328   ondansetron (ZOFRAN) tablet 4 mg, 4 mg, Oral, Q6H PRN **OR** ondansetron (ZOFRAN)  injection 4 mg, 4 mg, Intravenous, Q6H PRN, Patel, Vishal R, MD   sodium chloride flush (NS) 0.9 % injection 10-40 mL, 10-40 mL, Intracatheter, Q12H, Patel, Vishal R, MD   sodium chloride flush (NS) 0.9 % injection 10-40 mL, 10-40 mL, Intracatheter, PRN, Lenore Cordia, MD  Allergies:  Allergies  Allergen Reactions   Pollen Extract Other (See Comments)    Sinus congestion    Past Medical History, Surgical history, Social history, and Family History were reviewed and updated.  Review of Systems: Review of Systems  Constitutional:  Positive for appetite change.  HENT:  Negative.    Eyes: Negative.   Respiratory: Negative.    Cardiovascular: Negative.   Gastrointestinal:  Positive for abdominal distention, abdominal pain and vomiting.  Endocrine: Negative.   Genitourinary: Negative.    Musculoskeletal: Negative.   Skin: Negative.   Neurological: Negative.   Hematological: Negative.   Psychiatric/Behavioral: Negative.     Physical Exam:  height is 5\' 11"  (1.803 m) and weight is 154 lb 15.7 oz (70.3 kg). His oral temperature is 98.3 F (36.8 C). His blood pressure is 145/96 (abnormal) and his pulse is 79. His respiration is 17 and oxygen saturation is 100%.   Wt Readings from Last 3 Encounters:  06/10/21 154 lb 15.7 oz (70.3 kg)  06/04/21 147 lb 7.8 oz (66.9 kg)  05/26/21 164 lb 0.4 oz (74.4 kg)    Physical Exam Vitals reviewed.  HENT:     Head: Normocephalic and atraumatic.  Eyes:     Pupils: Pupils are equal, round, and reactive to light.  Cardiovascular:     Rate and Rhythm: Normal rate and regular rhythm.     Heart sounds: Normal heart sounds.  Pulmonary:     Effort: Pulmonary effort is normal.     Breath sounds: Normal breath sounds.  Abdominal:     General: Bowel sounds are normal.     Palpations: Abdomen is soft.     Comments: Abdominal exam is somewhat distended.  Bowel sounds are present.  There are some tenderness to palpation.  There is no obvious fluid  wave.  There is no obvious abdominal mass.  There is no hepatomegaly.  Musculoskeletal:        General: No tenderness or deformity. Normal range of motion.     Cervical back: Normal range of motion.  Lymphadenopathy:     Cervical: No cervical adenopathy.  Skin:    General: Skin is warm and dry.     Findings: No erythema or rash.  Neurological:     Mental Status: He is alert and oriented to person, place, and time.  Psychiatric:        Behavior: Behavior normal.        Thought Content: Thought content normal.        Judgment: Judgment normal.     Lab Results  Component Value Date   WBC 13.9 (H) 06/10/2021   HGB 13.3 06/10/2021   HCT 41.7 06/10/2021   MCV 99.0 06/10/2021   PLT 524 (H) 06/10/2021   @LASTCHEMISTRY @    Impression and Plan: Kevin Villegas is a 80 year old white male.  He has metastatic pancreatic cancer.  He has another bowel obstruction.  Again, this is his third admission for bowel obstruction.  He really needs a be taken to surgery to bypass this obstruction.  I do have to worry that this is going to be malignant.  He may have tumor pressing on the intestine.  Hopefully, when he goes to surgery, the surgeon will give [place] tissue specimens of the malignancy so we can send off molecular studies which will aid in any further therapy.  He apparently had NG tube that try to be placed but cannot be placed.  His labs look okay.  I appreciate the great care that he will get by all the staff on 4 E.  I appreciate their hard work and their compassion.  Kevin Haw, MD  [place] 12:12   Volanda Napoleon, MD 6/23/20227:21 AM

## 2021-06-10 NOTE — Hospital Course (Addendum)
80 year old man PMH metastatic pancreatic cancer, recurrent large bowel obstructions, status post colonic stent, presented with abdominal pain.  Admitted for recurrent large bowel obstruction.  Failed to improve with conservative management.  Followed by gastroenterology and general surgery.  Nutrition support routinely.  Underwent operative intervention 6/27.  Awaiting return of bowel function.

## 2021-06-10 NOTE — Assessment & Plan Note (Addendum)
--   Status post surgery with removal of stent, colostomy . -- Postoperative management per general surgery.  Await further recommendations from oncology.

## 2021-06-10 NOTE — Progress Notes (Signed)
Subjective: CC: Abdominal pain Patient is known to our service with multiple admissions for LBO.   Patient originally underwent flexible sigmoidoscopy and colonic stent placement on 6/8 by GI for stricture/stenosis of the sigmoid colon of unclear etiology during hospitalization from 5/31-6/8.    Readmitted with LBO on 6/14. CT 06/01/21 revealed diffuse colonic and small bowel dilatation proximal to the sigmoid colon stent. Patient was managed with conservative management and ultimately improved, being able to be discharged on bowel regimen on 6/18.  Reports starting 6/20 he began having generalized abdominal pain, distension/bloating and nausea after eating. His symptoms became persistent and he began having emesis yesterday. Reports his last BM was 6/19. He has not passed any flatus in the last 24 hours. He underwent CT that showed persistent bowel obstruction likely at the level of the proximal aspect of the stent in the sigmoid colon. He was transferred to [facility] for admission. NGT attempted but unable to be placed.   Patient has a hx of distal pancreatectomy, splenectomy, and partial gastrectomy in 10/2017 at [facility]. Currently being tx by Dr. Marin Olp for metastatic pancreatic cancer. He has a hx of duodenal stricture for which he underwent stent placement 12/20. Other abdominal surgeries include Lap Chole by Dr. Grandville Silos in 2015.  ROS: See above, otherwise other systems negative   Objective: Vital signs in last 24 hours: Temp:  [97.8 F (36.6 C)-98.5 F (36.9 C)] 98.2 F (36.8 C) (06/23 0835) Pulse Rate:  [69-91] 78 (06/23 0835) Resp:  [17-22] 19 (06/23 0835) BP: (120-150)/(87-109) 147/95 (06/23 0835) SpO2:  [96 %-100 %] 97 % (06/23 0835) Weight:  [69.4 kg-70.3 kg] 70.3 kg (06/23 0514)    Intake/Output from previous day: 06/22 0701 - 06/23 0700 In: 342.2 [I.V.:342.2] Out: -  Intake/Output this shift: No intake/output data recorded.  PE: Gen:  Alert, NAD, pleasant HEENT:  EOM's intact, pupils equal and round Card:  RRR, no M/G/R heard Pulm:  CTAB, no W/R/R, effort normal Abd: Soft, distended with generalized tenderness, no peritonitis, hypoactive bs, prior abdominal scar well healed Ext:  No LE edema Psych: A&Ox3  Skin: no rashes noted, warm and dry    Lab Results:  Recent Labs    06/09/21 1223 06/10/21 0350  WBC 13.8* 13.9*  HGB 14.5 13.3  HCT 43.3 41.7  PLT 530* 524*   BMET Recent Labs    06/09/21 1223 06/10/21 0350  NA 135 137  K 4.6 5.1  CL 102 103  CO2 25 25  GLUCOSE 113* 117*  BUN 26* 35*  CREATININE 1.48* 1.51*  CALCIUM 9.1 8.9   PT/INR No results for input(s): LABPROT, INR in the last 72 hours. CMP     Component Value Date/Time   NA 137 06/10/2021 0350   NA 141 03/22/2017 1101   K 5.1 06/10/2021 0350   CL 103 06/10/2021 0350   CO2 25 06/10/2021 0350   GLUCOSE 117 (H) 06/10/2021 0350   BUN 35 (H) 06/10/2021 0350   BUN 17 03/22/2017 1101   CREATININE 1.51 (H) 06/10/2021 0350   CREATININE 1.71 (H) 04/28/2021 1032   CALCIUM 8.9 06/10/2021 0350   PROT 7.4 06/09/2021 1223   PROT 6.9 03/22/2017 1101   ALBUMIN 3.6 06/09/2021 1223   ALBUMIN 4.5 03/22/2017 1101   AST 33 06/09/2021 1223   AST 27 04/28/2021 1032   ALT 31 06/09/2021 1223   ALT 18 04/28/2021 1032   ALKPHOS 78 06/09/2021 1223   BILITOT 0.4 06/09/2021 1223  BILITOT 0.5 04/28/2021 1032   GFRNONAA 47 (L) 06/10/2021 0350   GFRNONAA 40 (L) 04/28/2021 1032   GFRAA >60 11/24/2019 1600   Lipase     Component Value Date/Time   LIPASE 33 06/09/2021 1223       Studies/Results: CT Abdomen Pelvis Wo Contrast  Result Date: 06/09/2021 CLINICAL DATA:  80 year old male with history of acute onset of nonlocalized abdominal pain. History of pancreatic cancer. EXAM: CT ABDOMEN AND PELVIS WITHOUT CONTRAST TECHNIQUE: Multidetector CT imaging of the abdomen and pelvis was performed following the standard protocol without IV contrast. COMPARISON:  CT the abdomen pelvis  06/01/2021. FINDINGS: Lower chest: Severe calcifications of the aortic valve. Numerous pulmonary nodules noted throughout the visualize lung bases, similar to the recent prior study, measuring up to 1.7 x 1.5 cm in the right lower lobe (axial image 2 of series 4) and 1.7 x 1.9 cm in the inferior aspect of the lingula (axial image 1 of series 4). Several of these pulmonary nodules demonstrate internal cavitation. Hepatobiliary: Multiple well-defined low-attenuation lesions are again noted in the liver, incompletely characterized on today's non-contrast CT examination, but similar to the prior study and statistically likely to represent cysts, measuring up to 1.9 x 1.6 cm between segments 5 and 8 (axial image 18 of series 2). Ill-defined low-attenuation adjacent to the falciform ligament, similar to prior studies, favored to represent focal fatty infiltration. Status post cholecystectomy. Pancreas: Status post distal pancreatectomy. Residual head and uncinate process of the pancreas are grossly unremarkable in appearance on today's noncontrast CT examination. Spleen: Status post splenectomy. Adrenals/Urinary Tract: Elongated calculus in the distal third of the right ureter (sagittal image 51 of series 6 and axial image 72 of series 2) measuring 2.4 x 0.4 x 0.6 cm. This is associated with moderate proximal right hydroureteronephrosis. No additional calculi are noted within the collecting system of either kidney, along the course of the left ureter or within the lumen of the urinary bladder. No left hydroureteronephrosis. In the upper pole of the right kidney there is a 3.0 cm low-attenuation lesion previously characterized as a simple cyst. Unenhanced appearance of the left kidney is unremarkable. Bilateral adrenal glands are normal in appearance. Urinary bladder is normal in appearance. Stomach/Bowel: Unenhanced appearance of the stomach is normal. There is a stent present extending from the distal duodenum into the  proximal jejunum. Previously noted ill-defined soft tissue attenuation mass adjacent to the stent appears larger than the prior examination, currently measuring approximately 4.6 x 3.7 cm (axial image 28 of series 2). There is also a stent in the distal sigmoid colon, with some subtle surrounding inflammatory changes adjacent to the proximal aspect of the stent (best appreciated on axial image 64 of series 2). Notably, the proximal end of the stent appears to be impacted upon the lateral aspect of the sigmoid colon wall, best appreciated on axial image 66 of series 2. Proximal to the colonic stent there is dilatation of both the colon and small bowel with multiple air-fluid levels. Small bowel dilatation measures up to 3.7 cm in diameter in the jejunum and up to 4.2 cm in diameter in the ileum indicative of obstruction. There is a paucity of stool in the distal sigmoid colon and rectum beyond the stent. Appendix is not confidently identified. Vascular/Lymphatic: Aortic atherosclerosis. No lymphadenopathy noted in the abdomen or pelvis. Reproductive: Prostate gland and seminal vesicles are unremarkable in appearance. Other: Trace volume of ascites.  No pneumoperitoneum. Musculoskeletal: There are no aggressive appearing lytic or  blastic lesions noted in the visualized portions of the skeleton. IMPRESSION: 1. Persistent bowel obstruction likely at the level of the proximal aspect of the stent in the sigmoid colon where there are some surrounding inflammatory changes. 2. Interval enlargement of a soft tissue attenuation mass adjacent to the duodenal/jejunal stent, concerning for neoplasm. 3. Widespread metastatic disease to the visualized lung bases, similar to the prior study. 4. Elongated calculus in the distal third of the right ureter with moderate proximal right hydroureteronephrosis, similar to the prior study. 5. There are calcifications of the aortic valve. Echocardiographic correlation for evaluation of  potential valvular dysfunction may be warranted if clinically indicated. 6. Aortic atherosclerosis. 7. Additional incidental findings, as above. Electronically Signed   By: Vinnie Langton M.D.   On: 06/09/2021 13:09    Anti-infectives: Anti-infectives (From admission, onward)    None        Assessment/Plan Stricture/stenosis of sigmoid colon - Patient is known to our service with multiple admissions for LBO.  - Patient originally underwent flexible sigmoidoscopy and colonic stent placement on 6/8 by GI for stricture/stenosis of the sigmoid colon of unclear etiology during hospitalization from 5/31-6/8.   - Readmitted with LBO on 6/14. CT 06/01/21 revealed diffuse colonic and small bowel dilatation proximal to the sigmoid colon stent. Patient was managed with conservative management and ultimately improved, being able to be discharged on bowel regimen on 6/18. - Readmitted 6/23 for PO intolerance, abdominal pain, n/v and inability to pass flatus with CT that showed persistent bowel obstruction likely at the level of the proximal aspect of the stent in the sigmoid colon. NGT attempted but unable to be placed.  - Suspect patient will likely require surgery during admission. Discussed with patient this may result in an ostomy. Further recommendations after I discuss the case with Dr. Redmond Pulling   FEN - NPO, IVF per [facility] VTE - ok for prophylaxis from our standpoint ID - none indicated   Hx of duodenal stricture for which he underwent stent placement 11/2019 Hx metastatic pancreatic cancer  - S/p distal pancreatectomy, splenectomy, and partial gastrectomy in 10/2017 at [place] - Prior CT scan shows progressive metastatic disease with new/enlarging pulmonary nodules and enlarging mesenteric soft tissue mass at the duodenal jejunal junction  - Dr. Marin Olp following. Was restarted on chemo but apparently has had this in several weeks   LOS: 1 day    Jillyn Ledger , [facility]  Surgery 06/10/2021, 10:30 AM Please see Amion for pager number during day hours 7:00am-4:30pm

## 2021-06-11 ENCOUNTER — Inpatient Hospital Stay (HOSPITAL_COMMUNITY): Payer: Medicare Other

## 2021-06-11 ENCOUNTER — Encounter (HOSPITAL_COMMUNITY): Payer: Self-pay | Admitting: Family Medicine

## 2021-06-11 LAB — CBC WITH DIFFERENTIAL/PLATELET
Band Neutrophils: 20 %
Basophils Relative: 1 %
Blasts: NONE SEEN %
Eosinophils Relative: 0 %
HCT: 38 % — ABNORMAL LOW (ref 39.0–52.0)
Hemoglobin: 12.3 g/dL — ABNORMAL LOW (ref 13.0–17.0)
Lymphocytes Relative: 2 %
MCH: 31.9 pg (ref 26.0–34.0)
MCHC: 32.4 g/dL (ref 30.0–36.0)
MCV: 98.7 fL (ref 80.0–100.0)
Metamyelocytes Relative: NONE SEEN %
Monocytes Relative: 9 %
Myelocytes: NONE SEEN %
Neutrophils Relative %: 68 %
Platelets: 455 10*3/uL — ABNORMAL HIGH (ref 150–400)
Promyelocytes Relative: NONE SEEN %
RBC Morphology: NORMAL
RBC: 3.85 MIL/uL — ABNORMAL LOW (ref 4.22–5.81)
RDW: 14.6 % (ref 11.5–15.5)
WBC: 18.1 10*3/uL — ABNORMAL HIGH (ref 4.0–10.5)
nRBC: 0 % (ref 0.0–0.2)
nRBC: NONE SEEN /100 WBC

## 2021-06-11 LAB — BASIC METABOLIC PANEL
Anion gap: 10 (ref 5–15)
BUN: 39 mg/dL — ABNORMAL HIGH (ref 8–23)
CO2: 22 mmol/L (ref 22–32)
Calcium: 8.8 mg/dL — ABNORMAL LOW (ref 8.9–10.3)
Chloride: 107 mmol/L (ref 98–111)
Creatinine, Ser: 1.6 mg/dL — ABNORMAL HIGH (ref 0.61–1.24)
GFR, Estimated: 44 mL/min — ABNORMAL LOW (ref >60)
Glucose, Bld: 109 mg/dL — ABNORMAL HIGH (ref 70–99)
Potassium: 4.9 mmol/L (ref 3.5–5.1)
Sodium: 139 mmol/L (ref 135–145)

## 2021-06-11 LAB — MAGNESIUM: Magnesium: 2.2 mg/dL (ref 1.7–2.4)

## 2021-06-11 LAB — LACTIC ACID, PLASMA
Lactic Acid, Venous: 3.1 mmol/L (ref 0.5–1.9)
Lactic Acid, Venous: 2.5 mmol/L (ref 0.5–1.9)

## 2021-06-11 LAB — COMPREHENSIVE METABOLIC PANEL
ALT: 23 U/L (ref 0–44)
AST: 27 U/L (ref 15–41)
Albumin: 2.5 g/dL — ABNORMAL LOW (ref 3.5–5.0)
Alkaline Phosphatase: 69 U/L (ref 38–126)
Anion gap: 8 (ref 5–15)
BUN: 35 mg/dL — ABNORMAL HIGH (ref 8–23)
CO2: 21 mmol/L — ABNORMAL LOW (ref 22–32)
Calcium: 7.9 mg/dL — ABNORMAL LOW (ref 8.9–10.3)
Chloride: 108 mmol/L (ref 98–111)
Creatinine, Ser: 1.67 mg/dL — ABNORMAL HIGH (ref 0.61–1.24)
GFR, Estimated: 41 mL/min — ABNORMAL LOW (ref >60)
Glucose, Bld: 147 mg/dL — ABNORMAL HIGH (ref 70–99)
Potassium: 4 mmol/L (ref 3.5–5.1)
Sodium: 137 mmol/L (ref 135–145)
Total Bilirubin: 0.4 mg/dL (ref 0.3–1.2)
Total Protein: 5.5 g/dL — ABNORMAL LOW (ref 6.5–8.1)

## 2021-06-11 LAB — PHOSPHORUS: Phosphorus: 3.4 mg/dL (ref 2.5–4.6)

## 2021-06-11 LAB — PROTIME-INR
INR: 1.3 — ABNORMAL HIGH (ref 0.8–1.2)
Prothrombin Time: 15.7 seconds — ABNORMAL HIGH (ref 11.4–15.2)

## 2021-06-11 LAB — PREALBUMIN: Prealbumin: 14.4 mg/dL — ABNORMAL LOW (ref 18–38)

## 2021-06-11 LAB — PROCALCITONIN: Procalcitonin: 3.25 ng/mL

## 2021-06-11 LAB — APTT: aPTT: 26 seconds (ref 24–36)

## 2021-06-11 LAB — GLUCOSE, CAPILLARY
Glucose-Capillary: 131 mg/dL — ABNORMAL HIGH (ref 70–99)
Glucose-Capillary: 144 mg/dL — ABNORMAL HIGH (ref 70–99)

## 2021-06-11 MED ORDER — LACTATED RINGERS IV BOLUS (SEPSIS)
1000.0000 mL | Freq: Once | INTRAVENOUS | Status: AC
  Administered 2021-06-11: 1000 mL via INTRAVENOUS

## 2021-06-11 MED ORDER — LACTATED RINGERS IV BOLUS (SEPSIS)
250.0000 mL | Freq: Once | INTRAVENOUS | Status: AC
  Administered 2021-06-11: 250 mL via INTRAVENOUS

## 2021-06-11 MED ORDER — SODIUM CHLORIDE 0.45 % IV SOLN: INTRAVENOUS | Status: DC

## 2021-06-11 MED ORDER — TRAVASOL 10 % IV SOLN
INTRAVENOUS | Status: AC
  Filled 2021-06-11: qty 528

## 2021-06-11 MED ORDER — SODIUM CHLORIDE 0.45 % IV SOLN: INTRAVENOUS | Status: AC

## 2021-06-11 MED ORDER — LIP MEDEX EX OINT
1.0000 "application " | TOPICAL_OINTMENT | Freq: Two times a day (BID) | CUTANEOUS | Status: DC
  Administered 2021-06-11 – 2021-06-14 (×6): 1 via TOPICAL
  Filled 2021-06-11: qty 7

## 2021-06-11 MED ORDER — METRONIDAZOLE 500 MG/100ML IV SOLN
500.0000 mg | Freq: Three times a day (TID) | INTRAVENOUS | Status: DC
  Administered 2021-06-11: 500 mg via INTRAVENOUS
  Filled 2021-06-11: qty 100

## 2021-06-11 MED ORDER — SODIUM CHLORIDE 0.9 % IV SOLN
2.0000 g | Freq: Every day | INTRAVENOUS | Status: DC
  Administered 2021-06-11: 2 g via INTRAVENOUS
  Filled 2021-06-11: qty 20

## 2021-06-11 MED ORDER — PHENOL 1.4 % MT LIQD: 1.0000 | OROMUCOSAL | Status: DC | PRN

## 2021-06-11 MED ORDER — MENTHOL 3 MG MT LOZG: 1.0000 | LOZENGE | OROMUCOSAL | Status: DC | PRN

## 2021-06-11 MED ORDER — SODIUM CHLORIDE 0.9 % IV SOLN
3.0000 g | Freq: Four times a day (QID) | INTRAVENOUS | Status: DC
  Administered 2021-06-11 – 2021-06-14 (×11): 3 g via INTRAVENOUS
  Filled 2021-06-11 (×2): qty 8
  Filled 2021-06-11: qty 3
  Filled 2021-06-11 (×2): qty 8
  Filled 2021-06-11: qty 3
  Filled 2021-06-11: qty 8
  Filled 2021-06-11: qty 3
  Filled 2021-06-11 (×4): qty 8
  Filled 2021-06-11: qty 3

## 2021-06-11 MED ORDER — ALUM & MAG HYDROXIDE-SIMETH 200-200-20 MG/5ML PO SUSP: 30.0000 mL | Freq: Four times a day (QID) | ORAL | Status: DC | PRN

## 2021-06-11 MED ORDER — PHENOL 1.4 % MT LIQD: 2.0000 | OROMUCOSAL | Status: DC | PRN

## 2021-06-11 MED ORDER — LORAZEPAM 2 MG/ML IJ SOLN
1.0000 mg | Freq: Once | INTRAMUSCULAR | Status: AC
  Administered 2021-06-11: 1 mg via INTRAVENOUS
  Filled 2021-06-11: qty 1

## 2021-06-11 MED ORDER — INSULIN ASPART 100 UNIT/ML IJ SOLN
0.0000 [IU] | Freq: Three times a day (TID) | INTRAMUSCULAR | Status: DC
  Administered 2021-06-11 – 2021-06-12 (×2): 1 [IU] via SUBCUTANEOUS

## 2021-06-11 MED ORDER — SIMETHICONE 40 MG/0.6ML PO SUSP
80.0000 mg | Freq: Four times a day (QID) | ORAL | Status: DC | PRN
  Filled 2021-06-11: qty 1.2

## 2021-06-11 MED ORDER — DEXTROSE-NACL 5-0.45 % IV SOLN: INTRAVENOUS | Status: AC

## 2021-06-11 MED ORDER — MAGIC MOUTHWASH
15.0000 mL | Freq: Four times a day (QID) | ORAL | Status: DC | PRN
  Filled 2021-06-11: qty 15

## 2021-06-11 NOTE — Progress Notes (Signed)
   06/11/21 1619  Assess: MEWS Score  Temp 99.6 F (37.6 C)  BP (!) 164/109  Resp (!) 28  SpO2 91 %  O2 Device Nasal Cannula  O2 Flow Rate (L/min) 4 L/min  Assess: MEWS Score  MEWS Temp 0  MEWS Systolic 0  MEWS Pulse 1  MEWS RR 2  MEWS LOC 0  MEWS Score 3  MEWS Score Color Yellow  Assess: if the MEWS score is Yellow or Red  Were vital signs taken at a resting state? Yes  Focused Assessment Change from prior assessment (see assessment flowsheet)  Does the patient meet 2 or more of the SIRS criteria? Yes  Does the patient have a confirmed or suspected source of infection? Yes  Provider and Rapid Response Notified? Yes  MEWS guidelines implemented *See Row Information* Yes  Treat  MEWS Interventions Administered scheduled meds/treatments  Take Vital Signs  Increase Vital Sign Frequency  Yellow: Q 2hr X 2 then Q 4hr X 2, if remains yellow, continue Q 4hrs  Escalate  MEWS: Escalate Yellow: discuss with charge nurse/RN and consider discussing with provider and RRT  Notify: Charge Nurse/RN  Name of Charge Nurse/RN Notified Marissa, RN  Date Charge Nurse/RN Notified 06/11/21  Time Charge Nurse/RN Notified 35  Notify: Provider  Provider Name/Title [facility]  Date Provider Notified 06/11/21  Time Provider Notified 1630  Notification Type Face-to-face  Notification Reason Change in status  Provider response At bedside  Date of Provider Response 06/11/21  Time of Provider Response 1630  Document  Patient Outcome Other (Comment)  Progress note created (see row info) Yes  Assess: SIRS CRITERIA  SIRS Temperature  0  SIRS Pulse 1  SIRS Respirations  1  SIRS WBC 0  SIRS Score Sum  2

## 2021-06-11 NOTE — Progress Notes (Signed)
Chief Complaint/Subjective: Vomiting overnight, persistent abdominal discomfort and pain, no bowel movements  Review of Systems See above, otherwise other systems negative  @PMH @ @PSH @ @FMH @  Objective: Vital signs in last 24 hours: Temp:  [97.7 F (36.5 C)-98.2 F (36.8 C)] 98.2 F (36.8 C) (06/24 0404) Pulse Rate:  [79-90] 79 (06/24 0404) Resp:  [18-20] 18 (06/24 0404) BP: (127-142)/(86-97) 140/86 (06/24 0404) SpO2:  [95 %] 95 % (06/24 0404) Weight:  [70.9 kg] 70.9 kg (06/24 0500) Last BM Date: 06/04/21 Intake/Output from previous day: 06/23 0701 - 06/24 0700 In: 1994.5 [I.V.:1994.5] Out: 575 [Emesis/NG output:575] Intake/Output this shift: No intake/output data recorded.  PE: Gen: uncomfortable, alert Resp: nonlabored Card: RRR Abd: soft, distended  Lab Results:  Recent Labs    06/09/21 1223 06/10/21 0350  WBC 13.8* 13.9*  HGB 14.5 13.3  HCT 43.3 41.7  PLT 530* 524*   BMET Recent Labs    06/10/21 0350 06/11/21 0408  NA 137 139  K 5.1 4.9  CL 103 107  CO2 25 22  GLUCOSE 117* 109*  BUN 35* 39*  CREATININE 1.51* 1.60*  CALCIUM 8.9 8.8*   PT/INR No results for input(s): LABPROT, INR in the last 72 hours. CMP     Component Value Date/Time   NA 139 06/11/2021 0408   NA 141 03/22/2017 1101   K 4.9 06/11/2021 0408   CL 107 06/11/2021 0408   CO2 22 06/11/2021 0408   GLUCOSE 109 (H) 06/11/2021 0408   BUN 39 (H) 06/11/2021 0408   BUN 17 03/22/2017 1101   CREATININE 1.60 (H) 06/11/2021 0408   CREATININE 1.71 (H) 04/28/2021 1032   CALCIUM 8.8 (L) 06/11/2021 0408   PROT 7.4 06/09/2021 1223   PROT 6.9 03/22/2017 1101   ALBUMIN 3.6 06/09/2021 1223   ALBUMIN 4.5 03/22/2017 1101   AST 33 06/09/2021 1223   AST 27 04/28/2021 1032   ALT 31 06/09/2021 1223   ALT 18 04/28/2021 1032   ALKPHOS 78 06/09/2021 1223   BILITOT 0.4 06/09/2021 1223   BILITOT 0.5 04/28/2021 1032   GFRNONAA 44 (L) 06/11/2021 0408   GFRNONAA 40 (L) 04/28/2021 1032   GFRAA  >60 11/24/2019 1600   Lipase     Component Value Date/Time   LIPASE 33 06/09/2021 1223    Studies/Results: CT Abdomen Pelvis Wo Contrast  Result Date: 06/09/2021 CLINICAL DATA:  80 year old male with history of acute onset of nonlocalized abdominal pain. History of pancreatic cancer. EXAM: CT ABDOMEN AND PELVIS WITHOUT CONTRAST TECHNIQUE: Multidetector CT imaging of the abdomen and pelvis was performed following the standard protocol without IV contrast. COMPARISON:  CT the abdomen pelvis 06/01/2021. FINDINGS: Lower chest: Severe calcifications of the aortic valve. Numerous pulmonary nodules noted throughout the visualize lung bases, similar to the recent prior study, measuring up to 1.7 x 1.5 cm in the right lower lobe (axial image 2 of series 4) and 1.7 x 1.9 cm in the inferior aspect of the lingula (axial image 1 of series 4). Several of these pulmonary nodules demonstrate internal cavitation. Hepatobiliary: Multiple well-defined low-attenuation lesions are again noted in the liver, incompletely characterized on today's non-contrast CT examination, but similar to the prior study and statistically likely to represent cysts, measuring up to 1.9 x 1.6 cm between segments 5 and 8 (axial image 18 of series 2). Ill-defined low-attenuation adjacent to the falciform ligament, similar to prior studies, favored to represent focal fatty infiltration. Status post cholecystectomy. Pancreas: Status post distal pancreatectomy. Residual  head and uncinate process of the pancreas are grossly unremarkable in appearance on today's noncontrast CT examination. Spleen: Status post splenectomy. Adrenals/Urinary Tract: Elongated calculus in the distal third of the right ureter (sagittal image 51 of series 6 and axial image 72 of series 2) measuring 2.4 x 0.4 x 0.6 cm. This is associated with moderate proximal right hydroureteronephrosis. No additional calculi are noted within the collecting system of either kidney, along the  course of the left ureter or within the lumen of the urinary bladder. No left hydroureteronephrosis. In the upper pole of the right kidney there is a 3.0 cm low-attenuation lesion previously characterized as a simple cyst. Unenhanced appearance of the left kidney is unremarkable. Bilateral adrenal glands are normal in appearance. Urinary bladder is normal in appearance. Stomach/Bowel: Unenhanced appearance of the stomach is normal. There is a stent present extending from the distal duodenum into the proximal jejunum. Previously noted ill-defined soft tissue attenuation mass adjacent to the stent appears larger than the prior examination, currently measuring approximately 4.6 x 3.7 cm (axial image 28 of series 2). There is also a stent in the distal sigmoid colon, with some subtle surrounding inflammatory changes adjacent to the proximal aspect of the stent (best appreciated on axial image 64 of series 2). Notably, the proximal end of the stent appears to be impacted upon the lateral aspect of the sigmoid colon wall, best appreciated on axial image 66 of series 2. Proximal to the colonic stent there is dilatation of both the colon and small bowel with multiple air-fluid levels. Small bowel dilatation measures up to 3.7 cm in diameter in the jejunum and up to 4.2 cm in diameter in the ileum indicative of obstruction. There is a paucity of stool in the distal sigmoid colon and rectum beyond the stent. Appendix is not confidently identified. Vascular/Lymphatic: Aortic atherosclerosis. No lymphadenopathy noted in the abdomen or pelvis. Reproductive: Prostate gland and seminal vesicles are unremarkable in appearance. Other: Trace volume of ascites.  No pneumoperitoneum. Musculoskeletal: There are no aggressive appearing lytic or blastic lesions noted in the visualized portions of the skeleton. IMPRESSION: 1. Persistent bowel obstruction likely at the level of the proximal aspect of the stent in the sigmoid colon where  there are some surrounding inflammatory changes. 2. Interval enlargement of a soft tissue attenuation mass adjacent to the duodenal/jejunal stent, concerning for neoplasm. 3. Widespread metastatic disease to the visualized lung bases, similar to the prior study. 4. Elongated calculus in the distal third of the right ureter with moderate proximal right hydroureteronephrosis, similar to the prior study. 5. There are calcifications of the aortic valve. Echocardiographic correlation for evaluation of potential valvular dysfunction may be warranted if clinically indicated. 6. Aortic atherosclerosis. 7. Additional incidental findings, as above. Electronically Signed   By: Vinnie Langton M.D.   On: 06/09/2021 13:09    Anti-infectives: Anti-infectives (From admission, onward)    None       Assessment/Plan Stricture/stenosis of sigmoid colon, likely metastatic disease - Patient is known to our service with multiple admissions for LBO. - Patient originally underwent flexible sigmoidoscopy and colonic stent placement on 6/8 by GI for stricture/stenosis of the sigmoid colon of unclear etiology during hospitalization from 5/31-6/8.   - Readmitted with LBO on 6/14. CT 06/01/21 revealed diffuse colonic and small bowel dilatation proximal to the sigmoid colon stent. Patient was managed with conservative management and ultimately improved, being able to be discharged on bowel regimen on 6/18. - Readmitted 6/23 for PO intolerance, abdominal  pain, n/v and inability to pass flatus with CT that showed persistent bowel obstruction likely at the level of the proximal aspect of the stent in the sigmoid colon. NGT attempted but unable to be placed. - agree with plan for NG tube today with fluoro - agree with plan for surgery, unfortunately due to OR staffing issues and room availability, will not be today   FEN - NPO, IVF per [facility] VTE - ok for prophylaxis from our standpoint ID - none indicated   Hx of duodenal  stricture for which he underwent stent placement 11/2019 Hx metastatic pancreatic cancer  - S/p distal pancreatectomy, splenectomy, and partial gastrectomy in 10/2017 at [place] - Prior CT scan shows progressive metastatic disease with new/enlarging pulmonary nodules and enlarging mesenteric soft tissue mass at the duodenal jejunal junction  - Dr. Marin Olp following. Was restarted on chemo but apparently has had this in several weeks   LOS: 2 days   Mickeal Skinner , [facility] Surgery 06/11/2021, 8:57 AM Please see Amion for pager number during day hours 7:00am-4:30pm or 7:00am -11:30am on weekends

## 2021-06-11 NOTE — Progress Notes (Signed)
Kevin Villegas is still having vomiting.  An NG tube needs to be placed.  It looks like this is going to have to be done by radiology under fluoroscopic guidance.  I understand this.  This is been incredibly difficult to do otherwise.  Surgery has seen him.  Sounds like he will go to surgery.  Maybe, he will be able to go to the surgery this weekend.  His prealbumin is 14.4.  I would like to think that this would be okay for him to have surgery.  He is not able to eat.  Maybe, he needs TNA until he can have surgery to get continued nutrition.  There is been no bleeding.  His BUN is 39 creatinine 1.60.  I suspect this probably is more related to some decreased volume.  He is having some abdominal pain.  This seems to be under fairly good control.  We will just have to await the surgical procedure.  Hopefully, this can be done over the weekend or by Monday at the latest.  Again I do not think it would be a bad idea to put him on some TNA.  At least he would be able to get some nutrition this way.  At this point, is not much that we really need to do from a medical oncology perspective.  We will just have to follow along.  When he has surgery, I would hope that the surgeon will get some specimen of tumor for [place] so that we can send off for molecular analysis.  This will be incredibly important for treatment planning.  I know that he is getting incredible care from all staff up on [address].  I do appreciate all their hard work.  Lattie Haw, MD  Kevin Villegas 41:10

## 2021-06-11 NOTE — Progress Notes (Signed)
PHARMACY - TOTAL PARENTERAL NUTRITION CONSULT NOTE   Indication: Bowel obstruction  Patient Measurements: Height: 5\' 11"  (180.3 cm) Weight: 70.9 kg (156 lb 4.9 oz) IBW/kg (Calculated) : 75.3 TPN AdjBW (KG): 69.6 Body mass index is 21.8 kg/m. Usual Weight: 9 lb weight los in 5 weeks  Assessment:  80 year old male with medical history of metastatic pancreatic cancer s/p pancreatectomy, splenectomy, and partial gastrectomy in 10/2017 at [facility], recurrent large bowel obstructions, s/p colonic stent on. He presented to the ED due to abdominal pain and was admitted for recurrent large bowel obstruction. May require surgery with ostomy. Pharmacy consulted to start TPN as no NGT has been able to be placed.  Glucose / Insulin: No hx DM, AM glucose range 109-113 Electrolytes: All WNL; K at upper end of normal (4.9) Renal: CKDIIIa, SCr 1.6 Hepatic: LFTs WNL on 6/22 Prealb: 14.4 (6/24) TG: Intake / Output: UOP not recorded so I/O inaccurate, 2 occurrences yesterday. MIVF: NS 150 ml/hr GI Imaging: - 06/01/21 CT: revealed diffuse colonic and small bowel dilatation proximal to the sigmoid colon stent. - 6/22 CT: persistent bowel obstruction likely at the level of the proximal aspect of the stent in the sigmoid colon. GI Surgeries / Procedures:   Central access: implanted port TPN start date: plan 6/24  Nutritional Goals (per RD recommendation on 6/23): kCal: 2100-2350, Protein: 110-130, Fluid: ./= 2.3 L/day Goal TPN rate is 90 mL/hr (provides 119 g of protein and 2155 kcals per day)  Current Nutrition:  NPO  Plan:  Start TPN at 65mL/hr at 1800 - this will provide 53g protein, 958kcal Electrolytes in TPN:  Na 81mEq/L,  K 83mEq/L,  Ca 88mEq/L,  Mg 8mEq/L,  Phos 40mmol/L,  Cl:Ac 1:1 Add standard MVI and trace elements to TPN Initiate Sensitive q8h SSI and adjust as needed  Reduce MIVF to 85 mL/hr at 1800 Monitor TPN labs on Mon/Thurs, CMET/Mg/Phos in AM  Peggyann Juba, PharmD,  BCPS Pharmacy: [phone] 06/11/2021,7:31 AM

## 2021-06-11 NOTE — Progress Notes (Signed)
Nutrition Follow-up  DOCUMENTATION CODES:  Severe malnutrition in context of chronic illness  INTERVENTION:  TPN per Pharmacy  NUTRITION DIAGNOSIS:  Severe Malnutrition related to chronic illness, cancer and cancer related treatments as evidenced by moderate fat depletion, moderate muscle depletion, severe muscle depletion. -- ongoing  GOAL:  Patient will meet greater than or equal to 90% of their needs -- will address with TPN  MONITOR:  Labs, Weight trends, I & O's  REASON FOR ASSESSMENT:  Malnutrition Screening Tool    ASSESSMENT:  80 year old male with medical history of metastatic pancreatic cancer, recurrent large bowel obstructions, s/p colonic stent. He presented to the ED due to abdominal pain and was admitted for recurrent large bowel obstruction.  Pt continued to experience emesis overnight with persistent abdominal pain. No BM. NGT placement was attempted yesterday but was ultimately unsuccessful. Pt is to have NGT placed with fluoro. Pt also to undergo surgery, but will not be able to today due to limited staffing. RD consulted as Oncology would like pt to receive TPN until surgery can be performed given prolonged poor po intake and malnutrition. Per Pharmacy, TPN will be started at 48ml/hr today with goal rate of 65ml/hr (provides 2155 kcals and 119g protein at goal).   UOP: 2x unmeasured occurrences x24 hours Emesis: 548ml x24 hours  Medications: SSI Labs reviewed.  Diet Order:   Diet Order             Diet NPO time specified  Diet effective now                  EDUCATION NEEDS:  Not appropriate for education at this time  Skin:  Skin Assessment: Reviewed RN Assessment  Last BM:  PTA/unknown  Height:  Ht Readings from Last 1 Encounters:  06/09/21 5\' 11"  (1.803 m)   Weight:  Wt Readings from Last 1 Encounters:  06/11/21 70.9 kg   Ideal Body Weight:  78.2 kg  BMI:  Body mass index is 21.8 kg/m.  Estimated Nutritional Needs:  Kcal:   2100-2350 kcal Protein:  110-130 grams Fluid:  >/= 2.3 L/day    Larkin Ina, MS, RD, LDN (she/her/hers) RD pager number and weekend/on-call pager number located in [place].

## 2021-06-11 NOTE — Progress Notes (Addendum)
PROGRESS NOTE  Kevin Villegas AJO:[id number] DOB: [birth year] DOA: 06/09/2021 PCP: Dettinger, [name], MD  Brief History   80 year old man PMH metastatic pancreatic cancer, recurrent large bowel obstructions, status post colonic stent, presenting with abdominal pain.  Admitted for recurrent large bowel obstruction.  Followed by gastroenterology and general surgery.  Will need surgery this hospitalization.  A & P  * Large bowel obstruction (HCC) secondary to colonic stricture --Keep NPO.  Start TNA.  Place NG tube.  Surgery planned for next week.  Colon stricture ([place]) --plan as above  Malignant neoplasm of tail of pancreas ([place]) with metastatic disease --Followed by Dr. Jorje Guild through [facility] health and Dr. Marin Olp through [facility].  Was started on gemcitabine/Abraxane chemotherapy on 05/05/21, has not received further infusions due to frequent hospitalization.  Renal insufficiency --had one normal creatinine in May; this may be more acute secondary to hydroureter, though creatinine stable over last month.    Aortic atherosclerosis (HCC) --no treatment indicated  Hydroureter on right --d/w Dr. Junious Silk, follow-up with urology as outpatient no stent for now, can add a lot of morbidity (pain, UTI, bleeding). Shockwave therapy would be considered  Disposition Plan:  Discussion:   ADDENDUM CTSP for new hypoxia and oxygen at 4L, shaking Had NGT placed several hours ago, significant output Patient reports ongoing abd pain (no change) and throat pain s/p NGT placement On exam Hypertensive, SpO2 low 90s on 4L, Temp 99 Appears ill but not toxic Rigors  Calm CV RRR Resp CTA bilaterally Abd soft, distended nontender Psych alert, calm appropropriate  A/P Acute hypoxic resp failure s/p recent NGT placement, rigors Concern for sepsis  Aspiration pneumonitis/pneumonia high in differental No change in abd exam  Sepsis protocol, start abx Will notify surgeon although do not  see change in exam  SEEN AGAIN 1900 Resting comfortable, no acute distress, no apparent change expect oxygen up to 5L Labs in process I discussed with w/ night coverage to f/u workup and check on closely.  Status is: Inpatient  Remains inpatient appropriate because:IV treatments appropriate due to intensity of illness or inability to take PO and Inpatient level of care appropriate due to severity of illness  Dispo: The patient is from: Home              Anticipated d/c is to: Home              Patient currently is not medically stable to d/c.   Difficult to place patient No  DVT prophylaxis: heparin injection 5,000 Units Start: 06/09/21 2300   Code Status: Full Code Level of care: Med-Surg Family Communication: none  Murray Hodgkins, MD  [facility] Direct contact: see [email or web address] (further directions at bottom of note if needed) 7PM-7AM contact night coverage as at bottom of note 06/11/2021, 2:38 PM  LOS: 2 days   Significant Hospital Events   6/22 admit for large bowel obstruction   Consults:  GI General surgery   Procedures:    Micro Data:     Antimicrobials:  None   Interval History/Subjective  CC: f/u bowel obstruction  Vomited several times last night Nauseous now Still has abd pain Nervous about NGT --tried at [facility] but was too stressful Review of Systems  Respiratory:  Negative for shortness of breath.    Objective   Vitals:  Vitals:   06/11/21 0404 06/11/21 1245  BP: 140/86 123/82  Pulse: 79 73  Resp: 18 17  Temp: 98.2 F (36.8 C) 97.6 F (36.4 C)  SpO2:  95% 93%    Exam: Physical Exam Vitals and nursing note reviewed.  Constitutional:      Appearance: Normal appearance.  Cardiovascular:     Rate and Rhythm: Normal rate and regular rhythm.     Heart sounds: No murmur heard.   No gallop.  Pulmonary:     Effort: Pulmonary effort is normal. No respiratory distress.     Breath sounds: Normal breath sounds. No wheezing or rales.   Abdominal:     General: There is distension.     Palpations: Abdomen is soft.     Tenderness: There is no abdominal tenderness.  Neurological:     Mental Status: He is alert.  Psychiatric:        Mood and Affect: Mood normal.        Behavior: Behavior normal.    I have personally reviewed the labs and other data, making special note of:   Today's Data  Creatinine stable 1.60, MG and phos WNL   Scheduled Meds:  chlorhexidine  15 mL Mouth Rinse BID   Chlorhexidine Gluconate Cloth  6 each Topical Daily   heparin  5,000 Units Subcutaneous Q8H   insulin aspart  0-9 Units Subcutaneous Q8H   mouth rinse  15 mL Mouth Rinse q12n4p   sodium chloride flush  10-40 mL Intracatheter Q12H   Continuous Infusions:  sodium chloride     sodium chloride 1,000 mL (06/10/21 1612)   dextrose 5 % and 0.45% NaCl 125 mL/hr at 06/11/21 0757   TPN ADULT (ION)      Principal Problem:   Large bowel obstruction (HCC) secondary to colonic stricture Active Problems:   Malignant neoplasm of tail of pancreas (HCC) with metastatic disease   Colon stricture (HCC)   Renal insufficiency   Right ureteral stone   Hydroureter on right   Aortic atherosclerosis ([place])   LOS: 2 days   How to contact the [facility] Attending or Consulting provider 7A - 7P or covering provider during after hours [place], for this patient?  Check the care team in [facility] and look for a) attending/consulting TRH provider listed and b) the [facility] team listed Log into [email or web address] and use [place]'s universal password to access. If you do not have the password, please contact the hospital operator. Locate the [facility] provider you are looking for under [facility] and page to a number that you can be directly reached. If you still have difficulty reaching the provider, please page the [facility] (Director on Call) for the Hospitalists listed on amion for assistance.

## 2021-06-11 NOTE — Progress Notes (Signed)
Pharmacy Antibiotic Note  Kevin Villegas is a 80 y.o. male admitted on 06/09/2021 with pneumonia.  Pharmacy has been consulted for Unasyn dosing.  Plan: Unasyn 3 gm IV q6h Pharmacy to sign off  Height: 5\' 11"  (180.3 cm) Weight: 70.9 kg (156 lb 4.9 oz) IBW/kg (Calculated) : 75.3  Temp (24hrs), Avg:98.3 F (36.8 C), Min:97.6 F (36.4 C), Max:99.6 F (37.6 C)  Recent Labs  Lab 06/05/21 0500 06/09/21 1223 06/10/21 0350 06/11/21 0408 06/11/21 1907  WBC  --  13.8* 13.9*  --  18.1*  CREATININE 1.40* 1.48* 1.51* 1.60*  --     Estimated Creatinine Clearance: 37.5 mL/min (A) (by C-G formula based on SCr of 1.6 mg/dL (H)).    Allergies  Allergen Reactions   Pollen Extract Other (See Comments)    Sinus congestion    Thank you for allowing pharmacy to be a part of this patient's care.  Eudelia Bunch, Pharm.D 06/11/2021 7:56 PM

## 2021-06-11 NOTE — Progress Notes (Signed)
Patient has been refusing Heparin Injection. Reason for him to have it was explained.

## 2021-06-11 NOTE — Sepsis Progress Note (Signed)
eLink is monitoring this Code Sepsis. °

## 2021-06-11 NOTE — Sepsis Progress Note (Signed)
Secure chat with bedside RN Aldona Bar who stated the blood cultures were drawn PRIOR to antibiotic dosing.

## 2021-06-11 NOTE — Progress Notes (Signed)
Attempt to pass NGT was refused by patient.

## 2021-06-12 DIAGNOSIS — E43 Unspecified severe protein-calorie malnutrition: Secondary | ICD-10-CM | POA: Insufficient documentation

## 2021-06-12 DIAGNOSIS — J9601 Acute respiratory failure with hypoxia: Secondary | ICD-10-CM

## 2021-06-12 DIAGNOSIS — J690 Pneumonitis due to inhalation of food and vomit: Secondary | ICD-10-CM

## 2021-06-12 DIAGNOSIS — K56699 Other intestinal obstruction unspecified as to partial versus complete obstruction: Principal | ICD-10-CM

## 2021-06-12 LAB — CBC
HCT: 34.8 % — ABNORMAL LOW (ref 39.0–52.0)
Hemoglobin: 11.3 g/dL — ABNORMAL LOW (ref 13.0–17.0)
MCH: 32.2 pg (ref 26.0–34.0)
MCHC: 32.5 g/dL (ref 30.0–36.0)
MCV: 99.1 fL (ref 80.0–100.0)
Platelets: 434 10*3/uL — ABNORMAL HIGH (ref 150–400)
RBC: 3.51 MIL/uL — ABNORMAL LOW (ref 4.22–5.81)
RDW: 14.9 % (ref 11.5–15.5)
WBC: 34.8 10*3/uL — ABNORMAL HIGH (ref 4.0–10.5)
nRBC: 0 % (ref 0.0–0.2)

## 2021-06-12 LAB — COMPREHENSIVE METABOLIC PANEL
ALT: 23 U/L (ref 0–44)
AST: 24 U/L (ref 15–41)
Albumin: 2.7 g/dL — ABNORMAL LOW (ref 3.5–5.0)
Alkaline Phosphatase: 65 U/L (ref 38–126)
Anion gap: 10 (ref 5–15)
BUN: 32 mg/dL — ABNORMAL HIGH (ref 8–23)
CO2: 22 mmol/L (ref 22–32)
Calcium: 8.1 mg/dL — ABNORMAL LOW (ref 8.9–10.3)
Chloride: 105 mmol/L (ref 98–111)
Creatinine, Ser: 1.61 mg/dL — ABNORMAL HIGH (ref 0.61–1.24)
GFR, Estimated: 43 mL/min — ABNORMAL LOW (ref >60)
Glucose, Bld: 118 mg/dL — ABNORMAL HIGH (ref 70–99)
Potassium: 3.8 mmol/L (ref 3.5–5.1)
Sodium: 137 mmol/L (ref 135–145)
Total Bilirubin: 0.6 mg/dL (ref 0.3–1.2)
Total Protein: 5.3 g/dL — ABNORMAL LOW (ref 6.5–8.1)

## 2021-06-12 LAB — TRIGLYCERIDES: Triglycerides: 65 mg/dL (ref <150)

## 2021-06-12 LAB — GLUCOSE, CAPILLARY
Glucose-Capillary: 138 mg/dL — ABNORMAL HIGH (ref 70–99)
Glucose-Capillary: 111 mg/dL — ABNORMAL HIGH (ref 70–99)
Glucose-Capillary: 130 mg/dL — ABNORMAL HIGH (ref 70–99)

## 2021-06-12 LAB — DIFFERENTIAL
Band Neutrophils: 25 %
Basophils Relative: 0 %
Blasts: NONE SEEN %
Eosinophils Relative: 0 %
Lymphocytes Relative: 3 %
Metamyelocytes Relative: 1 %
Monocytes Relative: 5 %
Myelocytes: 0 %
Neutrophils Relative %: 66 %
Promyelocytes Relative: NONE SEEN %
RBC Morphology: NORMAL
nRBC: NONE SEEN /100 WBC

## 2021-06-12 LAB — PHOSPHORUS: Phosphorus: 3.2 mg/dL (ref 2.5–4.6)

## 2021-06-12 LAB — MAGNESIUM: Magnesium: 1.7 mg/dL (ref 1.7–2.4)

## 2021-06-12 LAB — LACTIC ACID, PLASMA: Lactic Acid, Venous: 2.4 mmol/L (ref 0.5–1.9)

## 2021-06-12 MED ORDER — MAGNESIUM SULFATE 2 GM/50ML IV SOLN
2.0000 g | Freq: Once | INTRAVENOUS | Status: AC
  Administered 2021-06-12: 2 g via INTRAVENOUS
  Filled 2021-06-12: qty 50

## 2021-06-12 MED ORDER — TRAVASOL 10 % IV SOLN
INTRAVENOUS | Status: AC
  Filled 2021-06-12: qty 858

## 2021-06-12 NOTE — Assessment & Plan Note (Addendum)
--   Patient wishes to avoid TNA.  Started on clear liquids.  If bowel function does not quickly return, will need to resume TNA.

## 2021-06-12 NOTE — Assessment & Plan Note (Addendum)
--  secondary to aspiration, resolved

## 2021-06-12 NOTE — Progress Notes (Signed)
Subjective/Chief Complaint: Patient feeling much less distended after NG placement Small amount of flatus today    Objective: Vital signs in last 24 hours: Temp:  [97.5 F (36.4 C)-99.6 F (37.6 C)] 97.5 F (36.4 C) (06/25 0459) Pulse Rate:  [67-104] 67 (06/25 0459) Resp:  [17-28] 20 (06/25 0459) BP: (95-170)/(60-109) 126/81 (06/25 0459) SpO2:  [90 %-100 %] 100 % (06/25 0459) Weight:  [70.5 kg] 70.5 kg (06/25 0500) Last BM Date: 06/04/21  Intake/Output from previous day: 06/24 0701 - 06/25 0700 In: 2591.2 [I.V.:2376.6; IV Piggyback:214.6] Out: 1520 [Urine:120; Emesis/NG output:1400] Intake/Output this shift: No intake/output data recorded.  PE NG in place; functioning well Abd - soft, mildly distended  Lab Results:  Recent Labs    06/11/21 1907 06/12/21 0246  WBC 18.1* 34.8*  HGB 12.3* 11.3*  HCT 38.0* 34.8*  PLT 455* 434*   BMET Recent Labs    06/11/21 1907 06/12/21 0246  NA 137 137  K 4.0 3.8  CL 108 105  CO2 21* 22  GLUCOSE 147* 118*  BUN 35* 32*  CREATININE 1.67* 1.61*  CALCIUM 7.9* 8.1*   PT/INR Recent Labs    06/11/21 1907  LABPROT 15.7*  INR 1.3*   ABG No results for input(s): PHART, HCO3 in the last 72 hours.  Invalid input(s): PCO2, PO2  Studies/Results: DG Chest Port 1 View  Result Date: 06/11/2021 CLINICAL DATA:  Sepsis EXAM: PORTABLE CHEST 1 VIEW COMPARISON:  CT abdomen pelvis 06/09/2021 FINDINGS: Implantable right IJ approach Port-A-Cath tip is currently accessed. Tip positioned at the superior cavoatrial junction. Transesophageal tube tip and side port terminate distal to the GE junction. Telemetry leads and external support devices overlie the chest. There are multifocal nodular opacities throughout both lung bases. More patchy consolidative opacities seen in the right lung base medially. Findings on a background of coarsened interstitial change and basilar scarring. No pneumothorax. No effusion. The aorta is calcified. The  remaining cardiomediastinal contours are unremarkable. No acute osseous or soft tissue abnormality. Degenerative changes are present in the imaged spine and shoulders. IMPRESSION: Lines and tubes as above. Multiple nodular opacities present in the bilateral lung bases, as seen on recent CT imaging of the abdomen and pelvis. More patchy opacity in the right medial lung base, could reflect aspiration, airspace disease or atelectasis. Aortic Atherosclerosis (ICD10-I70.0). Electronically Signed   By: Lovena Le M.D.   On: 06/11/2021 17:18   DG Naso G Tube Plc W/Fl W/Rad  Result Date: 06/11/2021 CLINICAL DATA:  Neoplastic obstruction of the distal sigmoid colon. History of pancreatic cancer. Stenting of the sigmoid colon and proximal jejunum. EXAM: NASO G TUBE PLACEMENT WITH FL AND WITH RAD CONTRAST:  None FLUOROSCOPY TIME:  Fluoroscopy Time:  48 seconds Radiation Exposure Index (if provided by the fluoroscopic device): 3.5 Number of Acquired Spot Images: 1 COMPARISON:  CT 06/09/2021 FINDINGS: NG tube was advanced through the RIGHT nostril into the esophagus and ultimately into the stomach. Tip the NG tube was advanced to the pyloric region of the stomach/first portion duodenum. IMPRESSION: NG tube advanced intp the distal stomach/proximal duodenum. Electronically Signed   By: Suzy Bouchard M.D.   On: 06/11/2021 12:34    Anti-infectives: Anti-infectives (From admission, onward)    Start     Dose/Rate Route Frequency Ordered Stop   06/12/21 0000  Ampicillin-Sulbactam (UNASYN) 3 g in sodium chloride 0.9 % 100 mL IVPB        3 g 200 mL/hr over 30 Minutes Intravenous Every 6 hours  06/11/21 1955     06/11/21 1800  metroNIDAZOLE (FLAGYL) IVPB 500 mg  Status:  Discontinued        500 mg 100 mL/hr over 60 Minutes Intravenous Every 8 hours 06/11/21 1636 06/11/21 1949   06/11/21 1700  cefTRIAXone (ROCEPHIN) 2 g in sodium chloride 0.9 % 100 mL IVPB  Status:  Discontinued        2 g 200 mL/hr over 30 Minutes  Intravenous Daily 06/11/21 1636 06/11/21 1949       Assessment/Plan: Stricture/stenosis of sigmoid colon, likely metastatic disease - Patient is known to our service with multiple admissions for LBO. - Patient originally underwent flexible sigmoidoscopy and colonic stent placement on 6/8 by GI for stricture/stenosis of the sigmoid colon of unclear etiology during hospitalization from 5/31-6/8.   - Readmitted with LBO on 6/14. CT 06/01/21 revealed diffuse colonic and small bowel dilatation proximal to the sigmoid colon stent. Patient was managed with conservative management and ultimately improved, being able to be discharged on bowel regimen on 6/18. - Readmitted 6/23 for PO intolerance, abdominal pain, n/v and inability to pass flatus with CT that showed persistent bowel obstruction likely at the level of the proximal aspect of the stent in the sigmoid colon. NGT placed 6/24 - agree with plan for surgery, diverting colostomy, biopsy of mass causing colon obstruction, possible resection if amenable.  Surgery will probably be scheduled for Monday by Dr. Hassell Done   FEN - NPO, IVF per [facility] VTE - ok for prophylaxis from our standpoint ID - none indicated   Hx of duodenal stricture for which he underwent stent placement 11/2019 Hx metastatic pancreatic cancer  - S/p distal pancreatectomy, splenectomy, and partial gastrectomy in 10/2017 at [place] - Prior CT scan shows progressive metastatic disease with new/enlarging pulmonary nodules and enlarging mesenteric soft tissue mass at the duodenal jejunal junction  - Dr. Marin Olp following. Was restarted on chemo but apparently has had this in several weeks    LOS: 3 days    Kevin Villegas 06/12/2021

## 2021-06-12 NOTE — Progress Notes (Signed)
Looks like Mr. Koeppen had a tough time yesterday.  He did have the NG tube placed.  It is certainly removing a lot of GI fluid.  His white cell count is quite high.  There is a question whether he has any kind of infection.  Chest ray shows possible pneumonia.  There is patchy opacity in the right medial lung.  He has multiple nodular opacities in both lung bases.  Cultures were taken.  They are not yet back.  He is on antibiotics with Unasyn.  I really hope that he will be taken to surgery to relieve the blockage.  He looks more comfortable this morning.  I saw him last night.  He had a little stress on him.  He is not happy about having the NG tube in.  His lab work this morning show white cell count 34.8.  Hemoglobin 9.3.  Platelet count 4-34,000.  His albumin is 2.7.  He is not complaining of any shortness of breath.  He has no cough.  He has had a little bit of diarrhea.  His vital signs show temperature 97.5.  Pulse 67.  Blood pressure 126/81.  His lungs sound pretty clear bilaterally.  Cardiac exam regular rate and rhythm.  He has no murmurs.  Abdomen is somewhat distended.  It is soft.  He has decreased bowel sounds.  There is no guarding.  Legs show no clubbing, cyanosis or edema.  I did tell him that he really needs to be on Lovenox help prevent blood clots.  He is at significant risk for blood clots.  He agrees to have Lovenox.  Now, the question is whether or not he will have surgery.  Not sure why had this episode yesterday that resulted in antibiotics.  Again he may have pneumonia.  I know he has lung metastasis.  I do appreciate the hard work everybody is doing for him on 4 E.  Lattie Haw, MD  Oswaldo Milian 41:10

## 2021-06-12 NOTE — Progress Notes (Signed)
Pt agitated much of the shift due to the presence of NGT. Asking continuously to have it removed. Pt with 834mL out this shift and multiple staff have explained the importance of keeping the tube in place. Pt also refusing subcu Heparin shots without numbing cream. MD Sarajane Jews made aware. Will continue to monitor patient.

## 2021-06-12 NOTE — Progress Notes (Signed)
PROGRESS NOTE  Kevin Villegas [name]:[id number] DOB: March 16, 1941 DOA: 06/09/2021 PCP: Dettinger, [name], MD  Brief History   80 year old man PMH metastatic pancreatic cancer, recurrent large bowel obstructions, status post colonic stent, presenting with abdominal pain.  Admitted for recurrent large bowel obstruction.  Followed by gastroenterology and general surgery.  Will need surgery this hospitalization.  A & P  * Large bowel obstruction (HCC) secondary to colonic stricture --Keep NPO.  Continue TNA and NG tube.  Surgery planned 6/27.  Colon stricture (HCC) --plan as above  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)M1  with metastatic disease --Followed by Dr. Jorje Villegas through [facility] health and Dr. Marin Villegas through [facility].  Was started on gemcitabine/Abraxane chemotherapy on 05/05/21, has not received further infusions due to frequent hospitalization.  Acute hypoxemic respiratory failure (HCC) --secondary to aspiration, now resolved  Aspiration pneumonitis (HCC) --hypoxia resolved, looks good this AM --will continue abx for now but given rapid improvement would favor short course --elevated lactic acid probably multifactorial, no s/s of sepsis now  Severe malnutrition (HCC) --TNA per pharmacy  Renal insufficiency --had one normal creatinine in May; this may be more acute secondary to hydroureter, though creatinine stable over last month.  --remains stable   Aortic atherosclerosis (HCC) --no treatment indicated  Hydroureter on right --d/w Dr. Junious Villegas, follow-up with urology as outpatient no stent for now, can add a lot of morbidity (pain, UTI, bleeding). Shockwave therapy would be considered  Disposition Plan:  Discussion:   Status is: Inpatient  Remains inpatient appropriate because:IV treatments appropriate due to intensity of illness or inability to take PO and Inpatient level of care appropriate due to severity of illness  Dispo: The patient is from:  Home              Anticipated d/c is to: Home              Patient currently is not medically stable to d/c.   Difficult to place patient No  DVT prophylaxis: heparin injection 5,000 Units Start: 06/09/21 2300   Code Status: Full Code Level of care: Progressive Family Communication: none  Kevin Hodgkins, MD  [facility] Direct contact: see [email or web address] (further directions at bottom of note if needed) 7PM-7AM contact night coverage as at bottom of note 06/12/2021, 12:13 PM  LOS: 3 days   Significant Hospital Events   6/22 admit for large bowel obstruction   Consults:  GI General surgery   Procedures:    Micro Data:     Antimicrobials:  None   Interval History/Subjective  CC: f/u bowel obstruction Did well overnight Breathing fine No abd pain now Bowels moving multiple times overnight  Review of Systems  Respiratory:  Negative for shortness of breath.    Objective   Vitals:  Vitals:   06/12/21 0023 06/12/21 0459  BP: 95/60 126/81  Pulse: 75 67  Resp: 20 20  Temp: 98.2 F (36.8 C) (!) 97.5 F (36.4 C)  SpO2: 95% 100%    Exam: Physical Exam Vitals and nursing note reviewed.  Constitutional:      Appearance: Normal appearance.  Cardiovascular:     Rate and Rhythm: Normal rate and regular rhythm.     Heart sounds: Murmur heard.  Pulmonary:     Effort: Pulmonary effort is normal. No respiratory distress.     Breath sounds: Normal breath sounds.     Comments: Off oxygen now Abdominal:     General: There is distension.     Palpations: Abdomen  is soft.     Tenderness: There is no abdominal tenderness.  Neurological:     Mental Status: He is alert.  Psychiatric:        Mood and Affect: Mood normal.        Behavior: Behavior normal.    I have personally reviewed the labs and other data, making special note of:   Today's Data  CBG stable BUN and creatinine stable Lactic acid trended down WBC up to 34.8  Scheduled Meds:  chlorhexidine  15  mL Mouth Rinse BID   Chlorhexidine Gluconate Cloth  6 each Topical Daily   heparin  5,000 Units Subcutaneous Q8H   insulin aspart  0-9 Units Subcutaneous Q8H   lip balm  1 application Topical BID   mouth rinse  15 mL Mouth Rinse q12n4p   sodium chloride flush  10-40 mL Intracatheter Q12H   Continuous Infusions:  sodium chloride 1,000 mL (06/10/21 1612)   ampicillin-sulbactam (UNASYN) IV 3 g (06/12/21 0604)   TPN ADULT (ION) 40 mL/hr at 06/11/21 1822   TPN ADULT (ION)      Principal Problem:   Large bowel obstruction (HCC) secondary to colonic stricture Active Problems:   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)M1  with metastatic disease   Colon stricture (HCC)   Severe malnutrition (HCC)   Aspiration pneumonitis (HCC)   Acute hypoxemic respiratory failure (HCC)   Renal insufficiency   Right ureteral stone   Hydroureter on right   Aortic atherosclerosis (HCC)   Protein-calorie malnutrition, severe   LOS: 3 days   How to contact the [facility] Attending or Consulting provider 7A - 7P or covering provider during after hours 7P -7A, for this patient?  Check the care team in [facility] and look for a) attending/consulting [facility] provider listed and b) the [facility] team listed Log into [email or web address] and use [place]'s universal password to access. If you do not have the password, please contact the hospital operator. Locate the [facility] provider you are looking for under [facility] and page to a number that you can be directly reached. If you still have difficulty reaching the provider, please page the [facility] (Director on Call) for the Hospitalists listed on amion for assistance.

## 2021-06-12 NOTE — Progress Notes (Signed)
PHARMACY - TOTAL PARENTERAL NUTRITION CONSULT NOTE   Indication: Bowel obstruction  Patient Measurements: Height: 5\' 11"  (180.3 cm) Weight: 70.5 kg (155 lb 6.8 oz) IBW/kg (Calculated) : 75.3 TPN AdjBW (KG): 69.6 Body mass index is 21.68 kg/m. Usual Weight: 9 lb weight los in 5 weeks  Assessment:  80 year old male with medical history of metastatic pancreatic cancer s/p pancreatectomy, splenectomy, and partial gastrectomy in 10/2017 at [facility], recurrent large bowel obstructions, s/p colonic stent on. He presented to the ED due to abdominal pain and was admitted for recurrent large bowel obstruction. May require surgery with ostomy. Pharmacy consulted to start TPN as no NGT has been able to be placed.  Glucose / Insulin: No hx DM, CBGs 138-144 since starting TPN  - 2 units SSI required Electrolytes: All WNL including CorrCa (9.4); Mg at low end goal (1.7) Renal: CKDIIIa, SCr 1.61, BUN slightly elevated at 32 Hepatic: LFTs WNL today, Alb low at 2.7 Prealb: 14.4 (6/24) TG: 65 (6/25) Intake / Output: UOP not recorded so I/O inaccurate, 2 occurrences yesterday. Stools x 5 recorded. MIVF: dc'd, LR boluses given 6/24 PM due to code sepsis GI Imaging: - 06/01/21 CT: revealed diffuse colonic and small bowel dilatation proximal to the sigmoid colon stent. - 6/22 CT: persistent bowel obstruction likely at the level of the proximal aspect of the stent in the sigmoid colon. GI Surgeries / Procedures:  - 6/24: NG tube placed  Central access: implanted port TPN start date: plan 6/24  Nutritional Goals (per RD recommendation on 6/24): kCal: 2100-2350, Protein: 110-130, Fluid: ./= 2.3 L/day Goal TPN rate is 90 mL/hr (provides 119 g of protein and 2155 kcals per day)  Current Nutrition:  NPO  Plan:  Now: Mag Sulfate 2g IV x 1 Increase TPN to 8mL/hr at 1800 - this will provide 85.8g protein, 1556kcal Electrolytes in TPN:  Na 56mEq/L,  K 63mEq/L,  Ca 14mEq/L,  Mg 12mEq/L,  Phos 46mmol/L,   Cl:Ac 1:1 Add standard MVI and trace elements to TPN Continue Sensitive q8h SSI and adjust as needed  MIVF per MD - none currently Monitor TPN labs on Mon/Thurs, CMET/Mg/Phos in AM  Peggyann Juba, PharmD, BCPS Pharmacy: [phone] 06/12/2021,8:14 AM

## 2021-06-12 NOTE — Progress Notes (Addendum)
[facility] Progress Note  Kevin Villegas 80 y.o. [birth year]   Subjective: Sitting on side of bed. Diarrhea today. Denies abdominal pain/vomiting. Wants NGT out.  Objective: Vital signs: Vitals:   06/12/21 0459 06/12/21 1303  BP: 126/81 114/65  Pulse: 67 63  Resp: 20 14  Temp: (!) 97.5 F (36.4 C) 97.7 F (36.5 C)  SpO2: 100% 99%    Physical Exam: Gen: lethargic, elderly, thin, no acute distress  HEENT: anicteric sclera CV: RRR Chest: CTA B Abd: soft, nontender, nondistended, +BS Ext: no edema  Lab Results: Recent Labs    06/11/21 0408 06/11/21 1907 06/12/21 0246  NA 139 137 137  K 4.9 4.0 3.8  CL 107 108 105  CO2 22 21* 22  GLUCOSE 109* 147* 118*  BUN 39* 35* 32*  CREATININE 1.60* 1.67* 1.61*  CALCIUM 8.8* 7.9* 8.1*  MG 2.2  --  1.7  PHOS 3.4  --  3.2   Recent Labs    06/11/21 1907 06/12/21 0246  AST 27 24  ALT 23 23  ALKPHOS 69 65  BILITOT 0.4 0.6  PROT 5.5* 5.3*  ALBUMIN 2.5* 2.7*   Recent Labs    06/11/21 1907 06/12/21 0246  WBC 18.1* 34.8*  HGB 12.3* 11.3*  HCT 38.0* 34.8*  MCV 98.7 99.1  PLT 455* 434*      Assessment/Plan: Recurrent large bowel obstruction in the setting of metastatic pancreatic cancer. LBO has failed to resolve with colonic stent placement and surgery planned for Monday 6/27. 500 cc removed from NGT on day shift so far. Would keep NGT in place but he does not understand why despite my explanation. Asked nurse to call surgeon to get their opinion and he was satisfied with that plan. No additional GI recs. Will follow from a distance with planned surgery. Call us if questions.   [name] 06/12/2021, 2:17 PM  Questions please call [phone] Patient ID: Kevin Villegas, male   DOB: 08/07/1941, 80 y.o.   MRN: [id number]

## 2021-06-12 NOTE — Assessment & Plan Note (Addendum)
--   Hypoxia quickly resolved but given leukocytosis antibiotics continued.  Repeat CBC in AM.

## 2021-06-13 ENCOUNTER — Encounter (HOSPITAL_COMMUNITY): Payer: Self-pay | Admitting: Family Medicine

## 2021-06-13 LAB — COMPREHENSIVE METABOLIC PANEL
ALT: 15 U/L (ref 0–44)
AST: 18 U/L (ref 15–41)
Albumin: 2.6 g/dL — ABNORMAL LOW (ref 3.5–5.0)
Alkaline Phosphatase: 64 U/L (ref 38–126)
Anion gap: 8 (ref 5–15)
BUN: 24 mg/dL — ABNORMAL HIGH (ref 8–23)
CO2: 26 mmol/L (ref 22–32)
Calcium: 8.4 mg/dL — ABNORMAL LOW (ref 8.9–10.3)
Chloride: 106 mmol/L (ref 98–111)
Creatinine, Ser: 1.31 mg/dL — ABNORMAL HIGH (ref 0.61–1.24)
GFR, Estimated: 55 mL/min — ABNORMAL LOW (ref >60)
Glucose, Bld: 121 mg/dL — ABNORMAL HIGH (ref 70–99)
Potassium: 3 mmol/L — ABNORMAL LOW (ref 3.5–5.1)
Sodium: 140 mmol/L (ref 135–145)
Total Bilirubin: 0.4 mg/dL (ref 0.3–1.2)
Total Protein: 5.7 g/dL — ABNORMAL LOW (ref 6.5–8.1)

## 2021-06-13 LAB — PHOSPHORUS: Phosphorus: 2.7 mg/dL (ref 2.5–4.6)

## 2021-06-13 LAB — GLUCOSE, CAPILLARY
Glucose-Capillary: 140 mg/dL — ABNORMAL HIGH (ref 70–99)
Glucose-Capillary: 95 mg/dL (ref 70–99)
Glucose-Capillary: 115 mg/dL — ABNORMAL HIGH (ref 70–99)

## 2021-06-13 LAB — MAGNESIUM: Magnesium: 2.1 mg/dL (ref 1.7–2.4)

## 2021-06-13 MED ORDER — METRONIDAZOLE 500 MG PO TABS: 500.0000 mg | ORAL_TABLET | ORAL | Status: DC

## 2021-06-13 MED ORDER — METRONIDAZOLE 500 MG PO TABS: 1000.0000 mg | ORAL_TABLET | ORAL | Status: DC

## 2021-06-13 MED ORDER — POTASSIUM CHLORIDE 10 MEQ/100ML IV SOLN
10.0000 meq | INTRAVENOUS | Status: AC
  Administered 2021-06-13 (×5): 10 meq via INTRAVENOUS
  Filled 2021-06-13 (×4): qty 100

## 2021-06-13 MED ORDER — NEOMYCIN SULFATE 500 MG PO TABS
1000.0000 mg | ORAL_TABLET | ORAL | Status: DC
  Administered 2021-06-13 (×3): 1000 mg via ORAL
  Filled 2021-06-13 (×4): qty 2

## 2021-06-13 MED ORDER — TRAVASOL 10 % IV SOLN
INTRAVENOUS | Status: AC
  Filled 2021-06-13: qty 1188

## 2021-06-13 MED ORDER — DEXTROSE 5 % IV SOLN
2.0000 g | INTRAVENOUS | Status: AC
  Administered 2021-06-14: 2 g via INTRAVENOUS
  Filled 2021-06-13: qty 2

## 2021-06-13 MED ORDER — POTASSIUM CHLORIDE 10 MEQ/50ML IV SOLN
10.0000 meq | INTRAVENOUS | Status: DC
  Filled 2021-06-13 (×5): qty 50

## 2021-06-13 MED ORDER — POLYETHYLENE GLYCOL 3350 17 G PO PACK: 17.0000 g | PACK | Freq: Two times a day (BID) | ORAL | Status: DC

## 2021-06-13 NOTE — H&P (View-Only) (Signed)
Subjective/Chief Complaint:  Patient hates his NG tube He is having multiple episodes of diarrhea  Objective: Vital signs in last 24 hours: Temp:  [97.7 F (36.5 C)-98.1 F (36.7 C)] 97.9 F (36.6 C) (06/26 0617) Pulse Rate:  [63-68] 68 (06/26 0617) Resp:  [14-18] 18 (06/25 2030) BP: (114-160)/(65-88) 160/88 (06/26 0617) SpO2:  [94 %-99 %] 94 % (06/26 0617) Weight:  [67.2 kg] 67.2 kg (06/26 0500) Last BM Date: 06/12/21  Intake/Output from previous day: 06/25 0701 - 06/26 0700 In: 928 [P.O.:60; I.V.:468; IV Piggyback:400] Out: 1300 [Emesis/NG output:1300] Intake/Output this shift: No intake/output data recorded.  GI: soft, non-tender; bowel sounds normal; no masses,  no organomegaly  Lab Results:  Recent Labs    06/11/21 1907 06/12/21 0246  WBC 18.1* 34.8*  HGB 12.3* 11.3*  HCT 38.0* 34.8*  PLT 455* 434*   BMET Recent Labs    06/12/21 0246 06/13/21 0504  NA 137 140  K 3.8 3.0*  CL 105 106  CO2 22 26  GLUCOSE 118* 121*  BUN 32* 24*  CREATININE 1.61* 1.31*  CALCIUM 8.1* 8.4*   PT/INR Recent Labs    06/11/21 1907  LABPROT 15.7*  INR 1.3*   ABG No results for input(s): PHART, HCO3 in the last 72 hours.  Invalid input(s): PCO2, PO2  Studies/Results: DG Chest Port 1 View  Result Date: 06/11/2021 CLINICAL DATA:  Sepsis EXAM: PORTABLE CHEST 1 VIEW COMPARISON:  CT abdomen pelvis 06/09/2021 FINDINGS: Implantable right IJ approach Port-A-Cath tip is currently accessed. Tip positioned at the superior cavoatrial junction. Transesophageal tube tip and side port terminate distal to the GE junction. Telemetry leads and external support devices overlie the chest. There are multifocal nodular opacities throughout both lung bases. More patchy consolidative opacities seen in the right lung base medially. Findings on a background of coarsened interstitial change and basilar scarring. No pneumothorax. No effusion. The aorta is calcified. The remaining  cardiomediastinal contours are unremarkable. No acute osseous or soft tissue abnormality. Degenerative changes are present in the imaged spine and shoulders. IMPRESSION: Lines and tubes as above. Multiple nodular opacities present in the bilateral lung bases, as seen on recent CT imaging of the abdomen and pelvis. More patchy opacity in the right medial lung base, could reflect aspiration, airspace disease or atelectasis. Aortic Atherosclerosis (ICD10-I70.0). Electronically Signed   By: Lovena Le M.D.   On: 06/11/2021 17:18   DG Naso G Tube Plc W/Fl W/Rad  Result Date: 06/11/2021 CLINICAL DATA:  Neoplastic obstruction of the distal sigmoid colon. History of pancreatic cancer. Stenting of the sigmoid colon and proximal jejunum. EXAM: NASO G TUBE PLACEMENT WITH FL AND WITH RAD CONTRAST:  None FLUOROSCOPY TIME:  Fluoroscopy Time:  48 seconds Radiation Exposure Index (if provided by the fluoroscopic device): 3.5 Number of Acquired Spot Images: 1 COMPARISON:  CT 06/09/2021 FINDINGS: NG tube was advanced through the RIGHT nostril into the esophagus and ultimately into the stomach. Tip the NG tube was advanced to the pyloric region of the stomach/first portion duodenum. IMPRESSION: NG tube advanced intp the distal stomach/proximal duodenum. Electronically Signed   By: Suzy Bouchard M.D.   On: 06/11/2021 12:34    Anti-infectives: Anti-infectives (From admission, onward)    Start     Dose/Rate Route Frequency Ordered Stop   06/12/21 0000  Ampicillin-Sulbactam (UNASYN) 3 g in sodium chloride 0.9 % 100 mL IVPB        3 g 200 mL/hr over 30 Minutes Intravenous Every 6 hours 06/11/21 1955  06/11/21 1800  metroNIDAZOLE (FLAGYL) IVPB 500 mg  Status:  Discontinued        500 mg 100 mL/hr over 60 Minutes Intravenous Every 8 hours 06/11/21 1636 06/11/21 1949   06/11/21 1700  cefTRIAXone (ROCEPHIN) 2 g in sodium chloride 0.9 % 100 mL IVPB  Status:  Discontinued        2 g 200 mL/hr over 30 Minutes Intravenous  Daily 06/11/21 1636 06/11/21 1949       Assessment/Plan: Stricture/stenosis of sigmoid colon, likely metastatic disease - Patient is known to our service with multiple admissions for LBO. - Patient originally underwent flexible sigmoidoscopy and colonic stent placement on 6/8 by GI for stricture/stenosis of the sigmoid colon of unclear etiology during hospitalization from 5/31-6/8.   - Readmitted with LBO on 6/14. CT 06/01/21 revealed diffuse colonic and small bowel dilatation proximal to the sigmoid colon stent. Patient was managed with conservative management and ultimately improved, being able to be discharged on bowel regimen on 6/18. - Readmitted 6/23 for PO intolerance, abdominal pain, n/v and inability to pass flatus with CT that showed persistent bowel obstruction likely at the level of the proximal aspect of the stent in the sigmoid colon. NGT placed 6/24 - agree with plan for surgery, diverting colostomy, biopsy of mass causing colon obstruction, possible resection if amenable.  Surgery will tentatively be scheduled for Monday by Dr. Hassell Done   FEN - Clear liquids today, NPO p MN, TNA, IVF per TRH VTE - ok for prophylaxis from our standpoint ID - none indicated   Hx of duodenal stricture for which he underwent stent placement 11/2019 Hx metastatic pancreatic cancer  - S/p distal pancreatectomy, splenectomy, and partial gastrectomy in 10/2017 at [place] - Prior CT scan shows progressive metastatic disease with new/enlarging pulmonary nodules and enlarging mesenteric soft tissue mass at the duodenal jejunal junction  - Dr. Marin Olp following. Was restarted on chemo but apparently has had this in several weeks  LOS: 4 days    Kevin Villegas 06/13/2021

## 2021-06-13 NOTE — Progress Notes (Signed)
PROGRESS NOTE  Kevin Villegas ZOX:[id number] DOB: November 11, 1941 DOA: 06/09/2021 PCP: Dettinger, [name], MD  Brief History   80 year old man PMH metastatic pancreatic cancer, recurrent large bowel obstructions, status post colonic stent, presenting with abdominal pain.  Admitted for recurrent large bowel obstruction.  Followed by gastroenterology and general surgery.  On TNA.  Bowels moving.  Plan for surgery 6/27.  A & P  * Large bowel obstruction (HCC) secondary to colonic stricture --Keep NPO.  Continue TNA and NG tube.  Surgery planned 6/27.  Colon stricture (HCC) --plan as above  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)M1  with metastatic disease --Followed by Dr. Jorje Guild through [facility] health and Dr. Marin Olp through [facility].  Was started on gemcitabine/Abraxane chemotherapy on 05/05/21, has not received further infusions due to frequent hospitalization.  Acute hypoxemic respiratory failure (HCC) --secondary to aspiration, now resolved  Aspiration pneumonitis (HCC) --hypoxia resolved  --will continue abx today and stop tomorrow  Severe malnutrition ([place]) --TNA per pharmacy  Renal insufficiency --had one normal creatinine in May; this may be more acute secondary to hydroureter, though creatinine stable over last month.  --creatinine better today, will follow  Aortic atherosclerosis (HCC) --no treatment indicated  Hydroureter on right --d/w Dr. Junious Silk, follow-up with urology as outpatient no stent for now, can add a lot of morbidity (pain, UTI, bleeding). Shockwave therapy would be considered --creatinine improved  Disposition Plan:  Discussion: appears better  Status is: Inpatient  Remains inpatient appropriate because:IV treatments appropriate due to intensity of illness or inability to take PO and Inpatient level of care appropriate due to severity of illness  Dispo: The patient is from: Home              Anticipated d/c is to: Home               Patient currently is not medically stable to d/c.   Difficult to place patient No  DVT prophylaxis: heparin injection 5,000 Units Start: 06/09/21 2300   Code Status: Full Code Level of care: Progressive Family Communication: none  Murray Hodgkins, MD  [facility] Direct contact: see [email or web address] (further directions at bottom of note if needed) 7PM-7AM contact night coverage as at bottom of note 06/13/2021, 2:03 PM  LOS: 4 days   Significant Hospital Events   6/22 admit for large bowel obstruction   Consults:  GI General surgery   Procedures:    Micro Data:     Antimicrobials:  None   Interval History/Subjective  CC: f/u bowel obstruction  Happy to have NGT out Feels better No pain Multiple BM and flatus Refusing heparin  Review of Systems  Respiratory:  Negative for shortness of breath.    Objective   Vitals:  Vitals:   06/13/21 0617 06/13/21 1222  BP: (!) 160/88 (!) 151/82  Pulse: 68 67  Resp:  18  Temp: 97.9 F (36.6 C) 98 F (36.7 C)  SpO2: 94% 99%    Exam: Physical Exam Vitals and nursing note reviewed.  Constitutional:      Appearance: Normal appearance. He is not ill-appearing.  HENT:     Head:     Comments: NGT out Cardiovascular:     Rate and Rhythm: Normal rate and regular rhythm.     Heart sounds: Murmur heard.  Pulmonary:     Effort: Pulmonary effort is normal. No respiratory distress.     Breath sounds: Normal breath sounds. No wheezing.  Abdominal:     Palpations: Abdomen is soft.  Neurological:     Mental Status: He is alert.  Psychiatric:        Mood and Affect: Mood normal.        Behavior: Behavior normal.    I have personally reviewed the labs and other data, making special note of:   Today's Data  CBG is stable K+ 3.9 Mg and Phos WNL LFTs noted  Scheduled Meds:  chlorhexidine  15 mL Mouth Rinse BID   Chlorhexidine Gluconate Cloth  6 each Topical Daily   heparin  5,000 Units Subcutaneous Q8H   insulin  aspart  0-9 Units Subcutaneous Q8H   lip balm  1 application Topical BID   mouth rinse  15 mL Mouth Rinse q12n4p   metroNIDAZOLE  1,000 mg Oral 3 times per day on Sun   neomycin  1,000 mg Oral 3 times per day on Sun   polyethylene glycol  17 g Oral BID   sodium chloride flush  10-40 mL Intracatheter Q12H   Continuous Infusions:  sodium chloride 1,000 mL (06/10/21 1612)   ampicillin-sulbactam (UNASYN) IV 3 g (06/13/21 1246)   [START ON 06/14/2021] cefoTEtan (CEFOTAN) IV     potassium chloride 10 mEq (06/13/21 1343)   TPN ADULT (ION) 65 mL/hr at 06/12/21 1743   TPN ADULT (ION)      Principal Problem:   Large bowel obstruction (HCC) secondary to colonic stricture Active Problems:   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)M1  with metastatic disease   Colon stricture (HCC)   Severe malnutrition (HCC)   Aspiration pneumonitis (HCC)   Acute hypoxemic respiratory failure (HCC)   Renal insufficiency   Right ureteral stone   Hydroureter on right   Aortic atherosclerosis (HCC)   Protein-calorie malnutrition, severe   LOS: 4 days   How to contact the [facility] Attending or Consulting provider 7A - 7P or covering provider during after hours 7P -7A, for this patient?  Check the care team in [facility] and look for a) attending/consulting [facility] provider listed and b) the [facility] team listed Log into [email or web address] and use [place]'s universal password to access. If you do not have the password, please contact the hospital operator. Locate the [facility] provider you are looking for under [facility] and page to a number that you can be directly reached. If you still have difficulty reaching the provider, please page the [facility] (Director on Call) for the Hospitalists listed on amion for assistance.

## 2021-06-13 NOTE — Progress Notes (Signed)
Pt seen by IV Therapy on routine rounds this morning; pt has TNA infusing to his portacath; site wnl, pt voicing frustrations over TNA, NG tube, peripheral iv in his arm; pt wanting his TNA stopped to take a shower , rather than have a CHG bath; explained that TNA cannot be interrupted, and for infection reasons, it is not good to shower when his port is accessed; pt's RN also at the bedside;  peripheral iv is to be used for medications, as his port is being used for TNA.  Pt wanting to be on a clear or full liquid diet rather than the TNA;  Will continue to follow the pt daily and monitor portacath /TNA status.

## 2021-06-13 NOTE — Progress Notes (Signed)
Subjective/Chief Complaint:  Patient hates his NG tube He is having multiple episodes of diarrhea  Objective: Vital signs in last 24 hours: Temp:  [97.7 F (36.5 C)-98.1 F (36.7 C)] 97.9 F (36.6 C) (06/26 0617) Pulse Rate:  [63-68] 68 (06/26 0617) Resp:  [14-18] 18 (06/25 2030) BP: (114-160)/(65-88) 160/88 (06/26 0617) SpO2:  [94 %-99 %] 94 % (06/26 0617) Weight:  [67.2 kg] 67.2 kg (06/26 0500) Last BM Date: 06/12/21  Intake/Output from previous day: 06/25 0701 - 06/26 0700 In: 928 [P.O.:60; I.V.:468; IV Piggyback:400] Out: 1300 [Emesis/NG output:1300] Intake/Output this shift: No intake/output data recorded.  GI: soft, non-tender; bowel sounds normal; no masses,  no organomegaly  Lab Results:  Recent Labs    06/11/21 1907 06/12/21 0246  WBC 18.1* 34.8*  HGB 12.3* 11.3*  HCT 38.0* 34.8*  PLT 455* 434*   BMET Recent Labs    06/12/21 0246 06/13/21 0504  NA 137 140  K 3.8 3.0*  CL 105 106  CO2 22 26  GLUCOSE 118* 121*  BUN 32* 24*  CREATININE 1.61* 1.31*  CALCIUM 8.1* 8.4*   PT/INR Recent Labs    06/11/21 1907  LABPROT 15.7*  INR 1.3*   ABG No results for input(s): PHART, HCO3 in the last 72 hours.  Invalid input(s): PCO2, PO2  Studies/Results: DG Chest Port 1 View  Result Date: 06/11/2021 CLINICAL DATA:  Sepsis EXAM: PORTABLE CHEST 1 VIEW COMPARISON:  CT abdomen pelvis 06/09/2021 FINDINGS: Implantable right IJ approach Port-A-Cath tip is currently accessed. Tip positioned at the superior cavoatrial junction. Transesophageal tube tip and side port terminate distal to the GE junction. Telemetry leads and external support devices overlie the chest. There are multifocal nodular opacities throughout both lung bases. More patchy consolidative opacities seen in the right lung base medially. Findings on a background of coarsened interstitial change and basilar scarring. No pneumothorax. No effusion. The aorta is calcified. The remaining  cardiomediastinal contours are unremarkable. No acute osseous or soft tissue abnormality. Degenerative changes are present in the imaged spine and shoulders. IMPRESSION: Lines and tubes as above. Multiple nodular opacities present in the bilateral lung bases, as seen on recent CT imaging of the abdomen and pelvis. More patchy opacity in the right medial lung base, could reflect aspiration, airspace disease or atelectasis. Aortic Atherosclerosis (ICD10-I70.0). Electronically Signed   By: Lovena Le M.D.   On: 06/11/2021 17:18   DG Naso G Tube Plc W/Fl W/Rad  Result Date: 06/11/2021 CLINICAL DATA:  Neoplastic obstruction of the distal sigmoid colon. History of pancreatic cancer. Stenting of the sigmoid colon and proximal jejunum. EXAM: NASO G TUBE PLACEMENT WITH FL AND WITH RAD CONTRAST:  None FLUOROSCOPY TIME:  Fluoroscopy Time:  48 seconds Radiation Exposure Index (if provided by the fluoroscopic device): 3.5 Number of Acquired Spot Images: 1 COMPARISON:  CT 06/09/2021 FINDINGS: NG tube was advanced through the RIGHT nostril into the esophagus and ultimately into the stomach. Tip the NG tube was advanced to the pyloric region of the stomach/first portion duodenum. IMPRESSION: NG tube advanced intp the distal stomach/proximal duodenum. Electronically Signed   By: Suzy Bouchard M.D.   On: 06/11/2021 12:34    Anti-infectives: Anti-infectives (From admission, onward)    Start     Dose/Rate Route Frequency Ordered Stop   06/12/21 0000  Ampicillin-Sulbactam (UNASYN) 3 g in sodium chloride 0.9 % 100 mL IVPB        3 g 200 mL/hr over 30 Minutes Intravenous Every 6 hours 06/11/21 1955  06/11/21 1800  metroNIDAZOLE (FLAGYL) IVPB 500 mg  Status:  Discontinued        500 mg 100 mL/hr over 60 Minutes Intravenous Every 8 hours 06/11/21 1636 06/11/21 1949   06/11/21 1700  cefTRIAXone (ROCEPHIN) 2 g in sodium chloride 0.9 % 100 mL IVPB  Status:  Discontinued        2 g 200 mL/hr over 30 Minutes Intravenous  Daily 06/11/21 1636 06/11/21 1949       Assessment/Plan: Stricture/stenosis of sigmoid colon, likely metastatic disease - Patient is known to our service with multiple admissions for LBO. - Patient originally underwent flexible sigmoidoscopy and colonic stent placement on 6/8 by GI for stricture/stenosis of the sigmoid colon of unclear etiology during hospitalization from 5/31-6/8.   - Readmitted with LBO on 6/14. CT 06/01/21 revealed diffuse colonic and small bowel dilatation proximal to the sigmoid colon stent. Patient was managed with conservative management and ultimately improved, being able to be discharged on bowel regimen on 6/18. - Readmitted 6/23 for PO intolerance, abdominal pain, n/v and inability to pass flatus with CT that showed persistent bowel obstruction likely at the level of the proximal aspect of the stent in the sigmoid colon. NGT placed 6/24 - agree with plan for surgery, diverting colostomy, biopsy of mass causing colon obstruction, possible resection if amenable.  Surgery will tentatively be scheduled for Monday by Dr. Hassell Done   FEN - Clear liquids today, NPO p MN, TNA, IVF per [facility] VTE - ok for prophylaxis from our standpoint ID - none indicated   Hx of duodenal stricture for which he underwent stent placement 11/2019 Hx metastatic pancreatic cancer  - S/p distal pancreatectomy, splenectomy, and partial gastrectomy in 10/2017 at [place] [facility] - Prior CT scan shows progressive metastatic disease with new/enlarging pulmonary nodules and enlarging mesenteric soft tissue mass at the duodenal jejunal junction  - Dr. Marin Olp following. Was restarted on chemo but apparently has had this in several weeks  LOS: 4 days    Kevin Villegas 06/13/2021

## 2021-06-13 NOTE — Progress Notes (Signed)
PHARMACY - TOTAL PARENTERAL NUTRITION CONSULT NOTE   Indication: Bowel obstruction  Patient Measurements: Height: 5\' 11"  (180.3 cm) Weight: 67.2 kg (148 lb 2.4 oz) IBW/kg (Calculated) : 75.3 TPN AdjBW (KG): 69.6 Body mass index is 20.66 kg/m. Usual Weight: 9 lb weight los in 5 weeks  Assessment:  80 year old male with medical history of metastatic pancreatic cancer s/p pancreatectomy, splenectomy, and partial gastrectomy in 10/2017 at [facility], recurrent large bowel obstructions, s/p colonic stent on. He presented to the ED due to abdominal pain and was admitted for recurrent large bowel obstruction. May require surgery with ostomy. Pharmacy consulted to start TPN as no NGT has been able to be placed.  Glucose / Insulin: No hx DM, CBGs 111-140, patient refusing some SSI requirements Electrolytes: K low (3.0), CorrCa (9.52); Mg improved to wnl after replacement yesterday (2.1) Renal: CKDIIIa, SCr improved 1.31, BUN improved 24 Hepatic: LFTs WNL today, Alb low at 2.6 Prealb: 14.4 (6/24) TG: 65 (6/25) Intake / Output: UOP not recorded so I/O inaccurate, 3 occurrences yesterday. Stools x 7 recorded. NGT 1300 ml output recorded. MIVF: dc'd, LR boluses given 6/24 PM due to code sepsis GI Imaging: - 06/01/21 CT: revealed diffuse colonic and small bowel dilatation proximal to the sigmoid colon stent. - 6/22 CT: persistent bowel obstruction likely at the level of the proximal aspect of the stent in the sigmoid colon. GI Surgeries / Procedures:  - 6/24: NG tube placed  Central access: implanted port TPN start date: plan 6/24  Nutritional Goals (per RD recommendation on 6/24): kCal: 2100-2350, Protein: 110-130, Fluid: ./= 2.3 L/day  Goal TPN rate is 90 mL/hr (provides 119 g of protein and 2155 kcals per day)  Current Nutrition:  NPO and TPN  Plan:  Now: KCl 15meq IV x 5 runs  Increase TPN to goal rate 57mL/hr at 1800  Electrolytes in TPN:  Na 50mEq/L,  K 65mEq/L,  Ca 45mEq/L,  Mg  16mEq/L,  Phos 84mmol/L,  Cl:Ac 1:1 Add standard MVI and trace elements to TPN Continue Sensitive q8h SSI and adjust as needed  MIVF per MD - none currently Monitor TPN labs on Mon/Thurs  Peggyann Juba, PharmD, BCPS Pharmacy: [phone] 06/13/2021,9:00 AM

## 2021-06-13 NOTE — Consult Note (Signed)
Kevin Villegas Nurse ostomy consult note  Kevin Villegas Nurse requested for preoperative stoma site marking for diverting colostomy by Dr. Jerilynn Villegas. Kevin Villegas.  Dr. Rockne Villegas to operate on Monday, 6/27.  Discussed surgical procedure and stoma creation with patient.  Explained role of the [facility] [place] nurse team. Answered patient questions.   Examined patient lying, sitting, and standing in order to place the marking in the patient's visual field, away from any creases or abdominal contour issues and within the rectus muscle.    Marked for colostomy in the LUQ  5.4 cm to the left of the umbilicus and 2.6 cm above the umbilicus.  Patient's abdomen cleansed with CHG wipes at site markings, allowed to air dry prior to marking. Covered mark with thin film transparent dressing to preserve mark until date/time of surgery (tomorrow).   [place] Nurse team will follow up with patient after surgery for continued ostomy care and teaching should a stoma be created intraoperatively.   Thank you fore the opportunity to meet and mark this nice gentleman.  Kevin Flakes, MSN, RN, [place], Kevin Villegas  Pager# [phone]

## 2021-06-14 ENCOUNTER — Encounter (HOSPITAL_COMMUNITY): Payer: Self-pay | Admitting: Family Medicine

## 2021-06-14 ENCOUNTER — Inpatient Hospital Stay (HOSPITAL_COMMUNITY): Payer: Medicare Other | Admitting: Anesthesiology

## 2021-06-14 ENCOUNTER — Encounter (HOSPITAL_COMMUNITY)
Admission: EM | Disposition: A | Discharge status: Home / Self Care | Payer: Self-pay | Source: Home / Self Care | Attending: Family Medicine

## 2021-06-14 HISTORY — PX: LAPAROSCOPIC SIGMOID COLECTOMY: SHX5928

## 2021-06-14 LAB — COMPREHENSIVE METABOLIC PANEL
ALT: 14 U/L (ref 0–44)
AST: 16 U/L (ref 15–41)
Albumin: 2.2 g/dL — ABNORMAL LOW (ref 3.5–5.0)
Alkaline Phosphatase: 56 U/L (ref 38–126)
Anion gap: 4 — ABNORMAL LOW (ref 5–15)
BUN: 20 mg/dL (ref 8–23)
CO2: 25 mmol/L (ref 22–32)
Calcium: 8.1 mg/dL — ABNORMAL LOW (ref 8.9–10.3)
Chloride: 110 mmol/L (ref 98–111)
Creatinine, Ser: 1.19 mg/dL (ref 0.61–1.24)
GFR, Estimated: >60 mL/min (ref >60)
Glucose, Bld: 135 mg/dL — ABNORMAL HIGH (ref 70–99)
Potassium: 3.9 mmol/L (ref 3.5–5.1)
Sodium: 139 mmol/L (ref 135–145)
Total Bilirubin: 0.4 mg/dL (ref 0.3–1.2)
Total Protein: 5.1 g/dL — ABNORMAL LOW (ref 6.5–8.1)

## 2021-06-14 LAB — DIFFERENTIAL
Abs Immature Granulocytes: 0.18 10*3/uL — ABNORMAL HIGH (ref 0.00–0.07)
Basophils Absolute: 0.1 10*3/uL (ref 0.0–0.1)
Basophils Relative: 0 %
Eosinophils Absolute: 0.4 10*3/uL (ref 0.0–0.5)
Eosinophils Relative: 2 %
Immature Granulocytes: 1 %
Lymphocytes Relative: 10 %
Lymphs Abs: 2.1 10*3/uL (ref 0.7–4.0)
Monocytes Absolute: 1.4 10*3/uL — ABNORMAL HIGH (ref 0.1–1.0)
Monocytes Relative: 7 %
Neutro Abs: 16.2 10*3/uL — ABNORMAL HIGH (ref 1.7–7.7)
Neutrophils Relative %: 80 %

## 2021-06-14 LAB — CBC
HCT: 35.1 % — ABNORMAL LOW (ref 39.0–52.0)
HCT: 35.1 % — ABNORMAL LOW (ref 39.0–52.0)
Hemoglobin: 11.3 g/dL — ABNORMAL LOW (ref 13.0–17.0)
Hemoglobin: 11.5 g/dL — ABNORMAL LOW (ref 13.0–17.0)
MCH: 31.7 pg (ref 26.0–34.0)
MCH: 31.9 pg (ref 26.0–34.0)
MCHC: 32.2 g/dL (ref 30.0–36.0)
MCHC: 32.8 g/dL (ref 30.0–36.0)
MCV: 98.6 fL (ref 80.0–100.0)
MCV: 97.5 fL (ref 80.0–100.0)
Platelets: 418 10*3/uL — ABNORMAL HIGH (ref 150–400)
Platelets: 486 10*3/uL — ABNORMAL HIGH (ref 150–400)
RBC: 3.56 MIL/uL — ABNORMAL LOW (ref 4.22–5.81)
RBC: 3.6 MIL/uL — ABNORMAL LOW (ref 4.22–5.81)
RDW: 14.8 % (ref 11.5–15.5)
RDW: 14.8 % (ref 11.5–15.5)
WBC: 20.3 10*3/uL — ABNORMAL HIGH (ref 4.0–10.5)
WBC: 39.9 10*3/uL — ABNORMAL HIGH (ref 4.0–10.5)
nRBC: 0 % (ref 0.0–0.2)
nRBC: 0 % (ref 0.0–0.2)

## 2021-06-14 LAB — GLUCOSE, CAPILLARY
Glucose-Capillary: 122 mg/dL — ABNORMAL HIGH (ref 70–99)
Glucose-Capillary: 120 mg/dL — ABNORMAL HIGH (ref 70–99)
Glucose-Capillary: 215 mg/dL — ABNORMAL HIGH (ref 70–99)
Glucose-Capillary: 217 mg/dL — ABNORMAL HIGH (ref 70–99)
Glucose-Capillary: 111 mg/dL — ABNORMAL HIGH (ref 70–99)

## 2021-06-14 LAB — TYPE AND SCREEN
ABO/RH(D): B POS
Antibody Screen: NEGATIVE

## 2021-06-14 LAB — CREATININE, SERUM
Creatinine, Ser: 1.33 mg/dL — ABNORMAL HIGH (ref 0.61–1.24)
GFR, Estimated: 54 mL/min — ABNORMAL LOW (ref >60)

## 2021-06-14 LAB — PHOSPHORUS: Phosphorus: 2.9 mg/dL (ref 2.5–4.6)

## 2021-06-14 LAB — MAGNESIUM: Magnesium: 1.7 mg/dL (ref 1.7–2.4)

## 2021-06-14 LAB — PREALBUMIN: Prealbumin: 7.8 mg/dL — ABNORMAL LOW (ref 18–38)

## 2021-06-14 LAB — TRIGLYCERIDES: Triglycerides: 86 mg/dL (ref <150)

## 2021-06-14 SURGERY — COLECTOMY, SIGMOID, LAPAROSCOPIC
Anesthesia: General

## 2021-06-14 MED ORDER — SODIUM CHLORIDE (PF) 0.9 % IJ SOLN
INTRAMUSCULAR | Status: AC
  Filled 2021-06-14: qty 20

## 2021-06-14 MED ORDER — SUGAMMADEX SODIUM 200 MG/2ML IV SOLN
INTRAVENOUS | Status: DC | PRN
  Administered 2021-06-14: 200 mg via INTRAVENOUS

## 2021-06-14 MED ORDER — PROPOFOL 10 MG/ML IV BOLUS
INTRAVENOUS | Status: AC
  Filled 2021-06-14: qty 20

## 2021-06-14 MED ORDER — TRAVASOL 10 % IV SOLN
INTRAVENOUS | Status: DC
  Filled 2021-06-14: qty 1188

## 2021-06-14 MED ORDER — ALBUMIN HUMAN 5 % IV SOLN
INTRAVENOUS | Status: AC
  Filled 2021-06-14: qty 250

## 2021-06-14 MED ORDER — PHENYLEPHRINE HCL-NACL 10-0.9 MG/250ML-% IV SOLN
INTRAVENOUS | Status: DC | PRN
  Administered 2021-06-14: 50 ug/min via INTRAVENOUS

## 2021-06-14 MED ORDER — LACTATED RINGERS IR SOLN
Status: DC | PRN
  Administered 2021-06-14: 1

## 2021-06-14 MED ORDER — INSULIN ASPART 100 UNIT/ML IJ SOLN
0.0000 [IU] | INTRAMUSCULAR | Status: DC
  Administered 2021-06-14: 7 [IU] via SUBCUTANEOUS

## 2021-06-14 MED ORDER — 0.9 % SODIUM CHLORIDE (POUR BTL) OPTIME
TOPICAL | Status: DC | PRN
  Administered 2021-06-14: 1000 mL

## 2021-06-14 MED ORDER — PROPOFOL 10 MG/ML IV BOLUS
INTRAVENOUS | Status: DC | PRN
  Administered 2021-06-14: 140 mg via INTRAVENOUS

## 2021-06-14 MED ORDER — HEPARIN SODIUM (PORCINE) 5000 UNIT/ML IJ SOLN
5000.0000 [IU] | Freq: Three times a day (TID) | INTRAMUSCULAR | Status: DC
  Administered 2021-06-14 – 2021-06-18 (×10): 5000 [IU] via SUBCUTANEOUS
  Filled 2021-06-14 (×11): qty 1

## 2021-06-14 MED ORDER — FENTANYL CITRATE (PF) 100 MCG/2ML IJ SOLN: 25.0000 ug | INTRAMUSCULAR | Status: DC | PRN

## 2021-06-14 MED ORDER — FENTANYL CITRATE (PF) 250 MCG/5ML IJ SOLN
INTRAMUSCULAR | Status: DC | PRN
  Administered 2021-06-14: 100 ug via INTRAVENOUS
  Administered 2021-06-14 (×4): 50 ug via INTRAVENOUS

## 2021-06-14 MED ORDER — KETAMINE HCL 10 MG/ML IJ SOLN
INTRAMUSCULAR | Status: DC | PRN
  Administered 2021-06-14: 30 mg via INTRAVENOUS

## 2021-06-14 MED ORDER — PHENYLEPHRINE HCL (PRESSORS) 10 MG/ML IV SOLN
INTRAVENOUS | Status: AC
  Filled 2021-06-14: qty 1

## 2021-06-14 MED ORDER — ONDANSETRON HCL 4 MG/2ML IJ SOLN
INTRAMUSCULAR | Status: DC | PRN
  Administered 2021-06-14: 4 mg via INTRAVENOUS

## 2021-06-14 MED ORDER — SODIUM CHLORIDE (PF) 0.9 % IJ SOLN
INTRAMUSCULAR | Status: AC
  Filled 2021-06-14: qty 10

## 2021-06-14 MED ORDER — ONDANSETRON HCL 4 MG/2ML IJ SOLN
INTRAMUSCULAR | Status: AC
  Filled 2021-06-14: qty 2

## 2021-06-14 MED ORDER — ONDANSETRON 4 MG PO TBDP: 4.0000 mg | ORAL_TABLET | Freq: Four times a day (QID) | ORAL | Status: DC | PRN

## 2021-06-14 MED ORDER — BUPIVACAINE LIPOSOME 1.3 % IJ SUSP
20.0000 mL | Freq: Once | INTRAMUSCULAR | Status: AC
  Administered 2021-06-14: 20 mL
  Filled 2021-06-14: qty 20

## 2021-06-14 MED ORDER — FENTANYL CITRATE (PF) 250 MCG/5ML IJ SOLN
INTRAMUSCULAR | Status: AC
  Filled 2021-06-14: qty 5

## 2021-06-14 MED ORDER — SODIUM CHLORIDE 0.9 % IV SOLN
2.0000 g | Freq: Two times a day (BID) | INTRAVENOUS | Status: AC
  Administered 2021-06-15: 2 g via INTRAVENOUS
  Filled 2021-06-14: qty 2

## 2021-06-14 MED ORDER — METOPROLOL TARTRATE 5 MG/5ML IV SOLN: 5.0000 mg | Freq: Four times a day (QID) | INTRAVENOUS | Status: DC | PRN

## 2021-06-14 MED ORDER — LACTATED RINGERS IV SOLN: INTRAVENOUS | Status: DC | PRN

## 2021-06-14 MED ORDER — LIDOCAINE 2% (20 MG/ML) 5 ML SYRINGE
INTRAMUSCULAR | Status: AC
  Filled 2021-06-14: qty 5

## 2021-06-14 MED ORDER — FENTANYL CITRATE (PF) 100 MCG/2ML IJ SOLN
INTRAMUSCULAR | Status: AC
  Filled 2021-06-14: qty 2

## 2021-06-14 MED ORDER — KCL IN DEXTROSE-NACL 20-5-0.45 MEQ/L-%-% IV SOLN
INTRAVENOUS | Status: DC
  Filled 2021-06-14 (×2): qty 1000

## 2021-06-14 MED ORDER — MORPHINE SULFATE (PF) 2 MG/ML IV SOLN
1.0000 mg | INTRAVENOUS | Status: DC | PRN
  Administered 2021-06-14: 1 mg via INTRAVENOUS
  Filled 2021-06-14: qty 1

## 2021-06-14 MED ORDER — DEXAMETHASONE SODIUM PHOSPHATE 10 MG/ML IJ SOLN
INTRAMUSCULAR | Status: AC
  Filled 2021-06-14: qty 1

## 2021-06-14 MED ORDER — LIDOCAINE 2% (20 MG/ML) 5 ML SYRINGE
INTRAMUSCULAR | Status: DC | PRN
  Administered 2021-06-14: 60 mg via INTRAVENOUS

## 2021-06-14 MED ORDER — CHLORHEXIDINE GLUCONATE CLOTH 2 % EX PADS
6.0000 | MEDICATED_PAD | Freq: Every day | CUTANEOUS | Status: DC
  Administered 2021-06-14 – 2021-06-18 (×5): 6 via TOPICAL

## 2021-06-14 MED ORDER — DEXAMETHASONE SODIUM PHOSPHATE 10 MG/ML IJ SOLN
INTRAMUSCULAR | Status: DC | PRN
  Administered 2021-06-14: 10 mg via INTRAVENOUS

## 2021-06-14 MED ORDER — EPHEDRINE SULFATE-NACL 50-0.9 MG/10ML-% IV SOSY
PREFILLED_SYRINGE | INTRAVENOUS | Status: DC | PRN
  Administered 2021-06-14: 10 mg via INTRAVENOUS

## 2021-06-14 MED ORDER — ONDANSETRON HCL 4 MG/2ML IJ SOLN: 4.0000 mg | Freq: Four times a day (QID) | INTRAMUSCULAR | Status: DC | PRN

## 2021-06-14 MED ORDER — EPHEDRINE 5 MG/ML INJ
INTRAVENOUS | Status: AC
  Filled 2021-06-14: qty 10

## 2021-06-14 MED ORDER — LIDOCAINE HCL 2 % IJ SOLN
INTRAMUSCULAR | Status: AC
  Filled 2021-06-14: qty 20

## 2021-06-14 MED ORDER — ROCURONIUM BROMIDE 10 MG/ML (PF) SYRINGE
PREFILLED_SYRINGE | INTRAVENOUS | Status: AC
  Filled 2021-06-14: qty 10

## 2021-06-14 MED ORDER — PANTOPRAZOLE SODIUM 40 MG IV SOLR
40.0000 mg | Freq: Every day | INTRAVENOUS | Status: DC
  Administered 2021-06-14 – 2021-06-15 (×2): 40 mg via INTRAVENOUS
  Filled 2021-06-14 (×2): qty 40

## 2021-06-14 MED ORDER — SUCCINYLCHOLINE CHLORIDE 200 MG/10ML IV SOSY
PREFILLED_SYRINGE | INTRAVENOUS | Status: DC | PRN
  Administered 2021-06-14: 120 mg via INTRAVENOUS

## 2021-06-14 MED ORDER — CEFAZOLIN SODIUM-DEXTROSE 2-4 GM/100ML-% IV SOLN
INTRAVENOUS | Status: AC
  Filled 2021-06-14: qty 100

## 2021-06-14 MED ORDER — MAGNESIUM SULFATE 2 GM/50ML IV SOLN
2.0000 g | Freq: Once | INTRAVENOUS | Status: AC
  Administered 2021-06-14: 2 g via INTRAVENOUS
  Filled 2021-06-14: qty 50

## 2021-06-14 MED ORDER — ROCURONIUM BROMIDE 10 MG/ML (PF) SYRINGE
PREFILLED_SYRINGE | INTRAVENOUS | Status: DC | PRN
  Administered 2021-06-14: 50 mg via INTRAVENOUS
  Administered 2021-06-14: 10 mg via INTRAVENOUS

## 2021-06-14 MED ORDER — SODIUM CHLORIDE 0.9% FLUSH
10.0000 mL | INTRAVENOUS | Status: DC | PRN
  Administered 2021-06-15: 10 mL

## 2021-06-14 MED ORDER — HYDROCODONE-ACETAMINOPHEN 5-325 MG PO TABS
1.0000 | ORAL_TABLET | ORAL | Status: DC | PRN
  Administered 2021-06-15: 2 via ORAL
  Filled 2021-06-14: qty 2

## 2021-06-14 MED ORDER — HYDROMORPHONE HCL 1 MG/ML IJ SOLN: 0.2500 mg | INTRAMUSCULAR | Status: DC | PRN

## 2021-06-14 MED ORDER — LIDOCAINE 2% (20 MG/ML) 5 ML SYRINGE
INTRAMUSCULAR | Status: DC | PRN
  Administered 2021-06-14: 1 mg/kg/h via INTRAVENOUS

## 2021-06-14 MED ORDER — ALBUMIN HUMAN 5 % IV SOLN: INTRAVENOUS | Status: DC | PRN

## 2021-06-14 SURGICAL SUPPLY — 87 items
APPLIER CLIP 5 13 M/L LIGAMAX5 (MISCELLANEOUS)
APPLIER CLIP ROT 10 11.4 M/L (STAPLE)
BAG COUNTER SPONGE SURGICOUNT (BAG) IMPLANT
BAG SURGICOUNT SPONGE COUNTING (BAG)
BLADE EXTENDED COATED 6.5IN (ELECTRODE) IMPLANT
CABLE HIGH FREQUENCY MONO STRZ (ELECTRODE) ×3 IMPLANT
CATH FOLEY 2WAY SLVR  5CC 24FR (CATHETERS) ×3
CATH FOLEY 2WAY SLVR 5CC 24FR (CATHETERS) IMPLANT
CELLS DAT CNTRL 66122 CELL SVR (MISCELLANEOUS) IMPLANT
CHLORAPREP W/TINT 26 (MISCELLANEOUS) ×3 IMPLANT
CLIP APPLIE 5 13 M/L LIGAMAX5 (MISCELLANEOUS) IMPLANT
CLIP APPLIE ROT 10 11.4 M/L (STAPLE) IMPLANT
COUNTER NEEDLE 20 DBL MAG RED (NEEDLE) ×3 IMPLANT
COVER MAYO STAND STRL (DRAPES) ×9 IMPLANT
COVER SURGICAL LIGHT HANDLE (MISCELLANEOUS) ×3 IMPLANT
DECANTER SPIKE VIAL GLASS SM (MISCELLANEOUS) ×3 IMPLANT
DRAPE LAPAROSCOPIC ABDOMINAL (DRAPES) ×3 IMPLANT
DRSG OPSITE POSTOP 4X10 (GAUZE/BANDAGES/DRESSINGS) IMPLANT
DRSG OPSITE POSTOP 4X6 (GAUZE/BANDAGES/DRESSINGS) ×2 IMPLANT
DRSG OPSITE POSTOP 4X8 (GAUZE/BANDAGES/DRESSINGS) IMPLANT
DRSG TEGADERM 2-3/8X2-3/4 SM (GAUZE/BANDAGES/DRESSINGS) ×2 IMPLANT
ELECT REM PT RETURN 15FT ADLT (MISCELLANEOUS) ×3 IMPLANT
GAUZE SPONGE 2X2 8PLY STRL LF (GAUZE/BANDAGES/DRESSINGS) IMPLANT
GAUZE SPONGE 4X4 12PLY STRL (GAUZE/BANDAGES/DRESSINGS) IMPLANT
GLOVE SURG ENC TEXT LTX SZ8 (GLOVE) ×6 IMPLANT
GOWN STRL REUS W/TWL XL LVL3 (GOWN DISPOSABLE) ×18 IMPLANT
HANDLE STAPLE EGIA 4 XL (STAPLE) ×2 IMPLANT
KIT TURNOVER KIT A (KITS) ×3 IMPLANT
LEGGING LITHOTOMY PAIR STRL (DRAPES) IMPLANT
MARKER SKIN DUAL TIP RULER LAB (MISCELLANEOUS) ×2 IMPLANT
PACK COLON (CUSTOM PROCEDURE TRAY) ×3 IMPLANT
PAD POSITIONING PINK XL (MISCELLANEOUS) ×3 IMPLANT
PENCIL SMOKE EVACUATOR (MISCELLANEOUS) IMPLANT
PORT LAP GEL ALEXIS MED 5-9CM (MISCELLANEOUS) IMPLANT
PROTECTOR NERVE ULNAR (MISCELLANEOUS) ×6 IMPLANT
RELOAD EGIA 60 MED/THCK PURPLE (STAPLE) IMPLANT
RELOAD EGIA BLACK ROTIC 45MM (STAPLE) ×3 IMPLANT
RELOAD PROXIMATE 75MM BLUE (ENDOMECHANICALS) IMPLANT
RELOAD STAPLE 45 BLK XTHK (STAPLE) IMPLANT
RELOAD STAPLE 60 MED/THCK ART (STAPLE) IMPLANT
RELOAD STAPLE 75 3.8 BLU REG (ENDOMECHANICALS) IMPLANT
RELOAD TRI 60 ART MED THCK BLK (STAPLE) ×2 IMPLANT
RETRACTOR WND ALEXIS 18 MED (MISCELLANEOUS) IMPLANT
RTRCTR WOUND ALEXIS 18CM MED (MISCELLANEOUS)
SCISSORS LAP 5X45 EPIX DISP (ENDOMECHANICALS) ×3 IMPLANT
SET IRRIG TUBING LAPAROSCOPIC (IRRIGATION / IRRIGATOR) ×3 IMPLANT
SET TUBE SMOKE EVAC HIGH FLOW (TUBING) ×3 IMPLANT
SHEARS HARMONIC ACE PLUS 45CM (MISCELLANEOUS) ×2 IMPLANT
SLEEVE ADV FIXATION 5X100MM (TROCAR) ×4 IMPLANT
SLEEVE XCEL OPT CAN 5 100 (ENDOMECHANICALS) ×6 IMPLANT
SPONGE GAUZE 2X2 STER 10/PKG (GAUZE/BANDAGES/DRESSINGS) ×2
SPONGE LAP 18X18 RF (DISPOSABLE) ×2 IMPLANT
STAPLER CIRC CVD 29MM 37CM (STAPLE) IMPLANT
STAPLER CVD CUT BL 40 RELOAD (ENDOMECHANICALS) IMPLANT
STAPLER CVD CUT BLU 40 RELOAD (ENDOMECHANICALS) IMPLANT
STAPLER ECHELON POWER CIR 29 (STAPLE) IMPLANT
STAPLER ECHELON POWER CIR 31 (STAPLE) IMPLANT
STAPLER GUN LINEAR PROX 60 (STAPLE) IMPLANT
STAPLER PROXIMATE 75MM BLUE (STAPLE) IMPLANT
STAPLER VISISTAT 35W (STAPLE) ×3 IMPLANT
SUT CHROMIC 3 0 SH 27 (SUTURE) ×4 IMPLANT
SUT ETHILON 2 0 PS N (SUTURE) ×2 IMPLANT
SUT NOVA NAB DX-16 0-1 5-0 T12 (SUTURE) ×4 IMPLANT
SUT PDS AB 1 CTX 36 (SUTURE) IMPLANT
SUT PDS AB 1 TP1 96 (SUTURE) IMPLANT
SUT PDS AB 4-0 SH 27 (SUTURE) IMPLANT
SUT PROLENE 2 0 KS (SUTURE) IMPLANT
SUT SILK 2 0 (SUTURE) ×3
SUT SILK 2 0 SH CR/8 (SUTURE) ×3 IMPLANT
SUT SILK 2-0 18XBRD TIE 12 (SUTURE) ×1 IMPLANT
SUT SILK 3 0 (SUTURE) ×3
SUT SILK 3 0 SH CR/8 (SUTURE) ×3 IMPLANT
SUT SILK 3-0 18XBRD TIE 12 (SUTURE) ×1 IMPLANT
SUT VIC AB 3-0 SH 18 (SUTURE) ×2 IMPLANT
SUT VIC AB 3-0 SH 27 (SUTURE) ×3
SUT VIC AB 3-0 SH 27XBRD (SUTURE) IMPLANT
SUT VIC AB 4-0 SH 18 (SUTURE) ×3 IMPLANT
SYS LAPSCP GELPORT 120MM (MISCELLANEOUS) ×3
SYSTEM LAPSCP GELPORT 120MM (MISCELLANEOUS) IMPLANT
TOWEL OR NON WOVEN STRL DISP B (DISPOSABLE) ×3 IMPLANT
TRAY FOLEY MTR SLVR 14FR STAT (SET/KITS/TRAYS/PACK) IMPLANT
TRAY FOLEY MTR SLVR 16FR STAT (SET/KITS/TRAYS/PACK) IMPLANT
TROCAR ADV FIXATION 5X100MM (TROCAR) ×2 IMPLANT
TROCAR BLADELESS OPT 5 100 (ENDOMECHANICALS) ×3 IMPLANT
TROCAR XCEL NON-BLD 11X100MML (ENDOMECHANICALS) IMPLANT
TUBING CONNECTING 10 (TUBING) IMPLANT
TUBING CONNECTING 10' (TUBING)

## 2021-06-14 NOTE — Interval H&P Note (Signed)
History and Physical Interval Note:  06/14/2021 12:55 PM  Kevin Villegas  has presented today for surgery, with the diagnosis of COLON OBSTRUCTION.  The various methods of treatment have been discussed with the patient and family. After consideration of risks, benefits and other options for treatment, the patient has consented to  Procedure(s): LAPAROSCOPIC VS OPEN SIGMOID COLECTOMY/ OSTOMY VS DIVERTING OSTOMY (N/A) as a surgical intervention.  The patient's history has been reviewed, patient examined, no change in status, stable for surgery.  I have reviewed the patient's chart and labs.  Questions were answered to the patient's satisfaction.     Pedro Earls

## 2021-06-14 NOTE — Progress Notes (Signed)
PROGRESS NOTE  Kevin Villegas QQI:[id number] DOB: 28-Nov-1941 DOA: 06/09/2021 PCP: Dettinger, [name], MD  Brief History   80 year old man PMH metastatic pancreatic cancer, recurrent large bowel obstructions, status post colonic stent, presenting with abdominal pain.  Admitted for recurrent large bowel obstruction.  Followed by gastroenterology and general surgery.  On TNA.  Bowels moving.  Plan for surgery 6/27.  A & P  * Large bowel obstruction (HCC) secondary to colonic stricture --Keep NPO.  Continue TNA and NG tube.  Surgery today 6/27.  Colon stricture (HCC) --plan as above  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)M1  with metastatic disease --Followed by Dr. Jorje Guild through [facility] and Dr. Marin Olp through [facility].  Was started on gemcitabine/Abraxane chemotherapy on 05/05/21, has not received further infusions due to frequent hospitalization.  Acute hypoxemic respiratory failure (HCC) --secondary to aspiration, now resolved  Aspiration pneumonitis (HCC) --hypoxia resolved, looks good.  --in view of leukocytosis will continue abx  Severe malnutrition (HCC) --TNA per pharmacy  Renal insufficiency --now resolved with TNA  Aortic atherosclerosis ([place]) --no treatment indicated  Hydroureter on right --d/w Dr. Junious Silk, follow-up with urology as outpatient no stent for now, can add a lot of morbidity (pain, UTI, bleeding). Shockwave therapy would be considered --creatinine much improved  Disposition Plan:  Discussion: appears better  Status is: Inpatient  Remains inpatient appropriate because:IV treatments appropriate due to intensity of illness or inability to take PO and Inpatient level of care appropriate due to severity of illness  Dispo: The patient is from: Home              Anticipated d/c is to: Home              Patient currently is not medically stable to d/c.   Difficult to place patient No  DVT prophylaxis:    Code Status: Full  Code Level of care: Progressive Family Communication: none  Murray Hodgkins, MD  [facility] Direct contact: see [email or web address] (further directions at bottom of note if needed) 7PM-7AM contact night coverage as at bottom of note 06/14/2021, 9:52 AM  LOS: 5 days   Significant Hospital Events   6/22 admit for large bowel obstruction   Consults:  GI General surgery   Procedures:    Micro Data:     Antimicrobials:  None   Interval History/Subjective  CC: f/u bowel obstruction  Overall feels good, no pain, breathing fine.  Ready for surgery.  Review of Systems  Respiratory:  Negative for shortness of breath.    Objective   Vitals:  Vitals:   06/13/21 2026 06/14/21 0620  BP: (!) 172/94 131/79  Pulse: 61 (!) 57  Resp: 18 18  Temp: 98.5 F (36.9 C) 98.5 F (36.9 C)  SpO2: 98% 97%    Exam: Physical Exam Vitals and nursing note reviewed.  Cardiovascular:     Rate and Rhythm: Normal rate and regular rhythm.     Heart sounds: Murmur heard.  Pulmonary:     Effort: No respiratory distress.     Breath sounds: No wheezing.  Abdominal:     Palpations: Abdomen is soft.  Neurological:     Mental Status: He is alert.  Psychiatric:        Mood and Affect: Mood normal.        Behavior: Behavior normal.   I have personally reviewed the labs and other data, making special note of:   Today's Data  CMP noted WBC down to 20.3, Hgb stable  11.3 CBG stable  Scheduled Meds:  chlorhexidine  15 mL Mouth Rinse BID   Chlorhexidine Gluconate Cloth  6 each Topical Daily   insulin aspart  0-9 Units Subcutaneous Q8H   lip balm  1 application Topical BID   mouth rinse  15 mL Mouth Rinse q12n4p   neomycin  1,000 mg Oral 3 times per day on Sun   polyethylene glycol  17 g Oral BID   sodium chloride flush  10-40 mL Intracatheter Q12H   Continuous Infusions:  sodium chloride 1,000 mL (06/10/21 1612)   ampicillin-sulbactam (UNASYN) IV 3 g (06/14/21 0606)   cefoTEtan (CEFOTAN)  IV     TPN ADULT (ION) 90 mL/hr at 06/13/21 1900   TPN ADULT (ION)      Principal Problem:   Large bowel obstruction (HCC) secondary to colonic stricture Active Problems:   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)M1  with metastatic disease   Colon stricture (HCC)   Severe malnutrition (HCC)   Aspiration pneumonitis (HCC)   Acute hypoxemic respiratory failure (HCC)   Renal insufficiency   Right ureteral stone   Hydroureter on right   Aortic atherosclerosis (HCC)   Protein-calorie malnutrition, severe   LOS: 5 days   How to contact the [facility] Attending or Consulting provider 7A - 7P or covering provider during after hours 7P -7A, for this patient?  Check the care team in [facility] and look for a) attending/consulting [facility] provider listed and b) the [facility] team listed Log into [email or web address] and use [place]'s universal password to access. If you do not have the password, please contact the hospital operator. Locate the [facility] provider you are looking for under [facility] and page to a number that you can be directly reached. If you still have difficulty reaching the provider, please page the [facility] (Director on Call) for the Hospitalists listed on amion for assistance.

## 2021-06-14 NOTE — Transfer of Care (Signed)
Immediate Anesthesia Transfer of Care Note  Patient: Kevin Villegas  Procedure(s) Performed: LAPAROSCOPY WITH MOBILIZATION, PROXIMAL DIVERTING COLOSTOMY, CREATION OF DISTAL MUCOUS FISTULA  Patient Location: PACU  Anesthesia Type:General  Level of Consciousness: drowsy and patient cooperative  Airway & Oxygen Therapy: Patient Spontanous Breathing and Patient connected to face mask oxygen  Post-op Assessment: Report given to RN and Post -op Vital signs reviewed and stable  Post vital signs: Reviewed and stable  Last Vitals:  Vitals Value Taken Time  BP 167/89 06/14/21 1704  Temp    Pulse 68 06/14/21 1708  Resp 14 06/14/21 1708  SpO2 99 % 06/14/21 1708  Vitals shown include unvalidated device data.  Last Pain:  Vitals:   06/14/21 1152  TempSrc: Oral  PainSc: 0-No pain      Patients Stated Pain Goal: 0 (75/30/10 4045)  Complications: No notable events documented.

## 2021-06-14 NOTE — Anesthesia Procedure Notes (Signed)
Procedure Name: Intubation Date/Time: 06/14/2021 1:30 PM Performed by: Sharlette Dense, CRNA Pre-anesthesia Checklist: Patient identified, Emergency Drugs available, Suction available and Patient being monitored Patient Re-evaluated:Patient Re-evaluated prior to induction Oxygen Delivery Method: Circle system utilized Preoxygenation: Pre-oxygenation with 100% oxygen Induction Type: IV induction Ventilation: Mask ventilation without difficulty and Oral airway inserted - appropriate to patient size Laryngoscope Size: Miller and 3 Grade View: Grade I Tube type: Oral Tube size: 8.0 mm Number of attempts: 1 Airway Equipment and Method: Stylet Placement Confirmation: ETT inserted through vocal cords under direct vision, positive ETCO2 and breath sounds checked- equal and bilateral Secured at: 22 cm Tube secured with: Tape Dental Injury: Teeth and Oropharynx as per pre-operative assessment

## 2021-06-14 NOTE — Anesthesia Postprocedure Evaluation (Signed)
Anesthesia Post Note  Patient: Kevin Villegas  Procedure(s) Performed: LAPAROSCOPY WITH MOBILIZATION, PROXIMAL DIVERTING COLOSTOMY, CREATION OF DISTAL MUCOUS FISTULA     Patient location during evaluation: PACU Anesthesia Type: General Level of consciousness: awake Pain management: pain level controlled Vital Signs Assessment: post-procedure vital signs reviewed and stable Respiratory status: spontaneous breathing Cardiovascular status: stable Postop Assessment: no apparent nausea or vomiting Anesthetic complications: no   No notable events documented.  Last Vitals:  Vitals:   06/14/21 0620 06/14/21 1152  BP: 131/79 (!) 153/88  Pulse: (!) 57 (!) 56  Resp: 18 18  Temp: 36.9 C 36.8 C  SpO2: 97% 99%    Last Pain:  Vitals:   06/14/21 1704  TempSrc:   PainSc: (P) 0-No pain                 Kalisha Keadle

## 2021-06-14 NOTE — Anesthesia Preprocedure Evaluation (Signed)
Anesthesia Evaluation  Patient identified by MRN, date of birth, ID band Patient awake    Reviewed: Allergy & Precautions, NPO status , Patient's Chart, lab work & pertinent test results  Airway Mallampati: II  TM Distance: >3 FB     Dental   Pulmonary neg pulmonary ROS,    breath sounds clear to auscultation       Cardiovascular negative cardio ROS   Rhythm:Regular Rate:Normal     Neuro/Psych  Neuromuscular disease    GI/Hepatic negative GI ROS, Neg liver ROS,   Endo/Other  negative endocrine ROS  Renal/GU Renal disease     Musculoskeletal   Abdominal   Peds  Hematology negative hematology ROS (+)   Anesthesia Other Findings   Reproductive/Obstetrics                             Anesthesia Physical Anesthesia Plan  ASA: 3  Anesthesia Plan: General   Post-op Pain Management:    Induction: Intravenous  PONV Risk Score and Plan: 3 and Ondansetron and Dexamethasone  Airway Management Planned: Oral ETT  Additional Equipment:   Intra-op Plan:   Post-operative Plan: Possible Post-op intubation/ventilation  Informed Consent: I have reviewed the patients History and Physical, chart, labs and discussed the procedure including the risks, benefits and alternatives for the proposed anesthesia with the patient or authorized representative who has indicated his/her understanding and acceptance.     Dental advisory given  Plan Discussed with: CRNA and Anesthesiologist  Anesthesia Plan Comments:         Anesthesia Quick Evaluation

## 2021-06-14 NOTE — Op Note (Signed)
Kevin Villegas  1941/05/16   06/14/2021    PCP:  Dettinger, Kevin Kaufmann, MD   Surgeon: Kevin Lim, MD, FACS  Asst:  none  Anes:  general  Preop Dx: Distal sigmoid colon obstruction with metal stent in place Postop Dx: same  Procedure: Laparoscopic mobilization of colon and attempted mobilization of the pelvis; colotomy and removal of metal stent sending it to path for exam of tissue, 24 foley venting mucous fistula; end colostomy Location Surgery: [facility] 2 Complications: None noted  EBL:   30 cc  Drains: none  Description of Procedure:  The patient was taken to OR 2 .  After anesthesia was administered and the patient was prepped  with chloroprep  and a timeout was performed.  Access to the abdomen was achieved with a 5 mm Optiview through the right upper quadrant.  Trocar trochars were placed just below the umbilicus in the midline and to the right of the midline.  The mobilized the left colon with harmonic scalpel needed taking up the descending colon.  He had prominent diverticular disease but this area was really dilated.  Working on the way down in the pelvis it was somewhat confusing there is almost like there is a likely a closed-loop because the colon seem to go into this very distended mass and then it was very stuck and then there was a loop that came out and went back down and I could feel the stent when I placed the midline HandPort.  With a HandPort in place I tried carefully to free up this distal sigmoid but found that it was fused.  I did get into the colon with a colotomy and remove the stent and sent the stent for pathologic examination of the tissue.  I closed the colotomy with a model layer with 3-0 Vicryl's.  I then fashioned a 24 Foley with a 10 cc balloon and brought it in through a separate incision just below the midline incision and was going through a pursestring of Vicryl and blew up the balloon into this distal colon as a mucous fistula.  The previous marked ostomy  site was then opened by removing the skin and then creating a fascial defect and I brought out the dilated descending and proximal sigmoid colon out through this area.  I put the scope back in and examined everything and there was hemostasis present.  The left ureter was not identified clearly but we are staying up in way from that structure in terms of the resection.  However distally and in the area where this tumor mass or what ever was causing this ingrowth it could be involving the ureter but we did not invade the ureteral space in that location.  Sponge and needle counts were correct.  The lower midline incision was closed.  The mucous fistula goes right and below the colon which is brought up to that incision.  Peritoneum was closed with running 2-0 Vicryl and then the wound was closed with interrupted buried knot #1 Novafil's.  Wound was irrigated and closed with staples.  The 2 lateral ports were closed.  The ostomy was matured with running locking suture of 3-0 chromic.  The patient tolerated the procedure well and was taken to the PACU in stable condition.     Kevin Villegas Done, [place], [facility], [place]

## 2021-06-14 NOTE — Progress Notes (Signed)
It looks like Kevin Villegas will go to surgery today for the bowel obstruction.  He has been seen by the ostomy nurse.  His NG tube is out.  He feels a whole lot better with the NG tube being taken out.  He is on TNA.  This definitely is helping his nutritional state.  His labs show white cell count 20.3.  Hemoglobin 11.3.  Platelet count 418,000.  His albumin is 2.2.  His prealbumin was 7.8.  BUN is 20 creatinine 1.19.  He has had no fever.  His cultures are all negative.  His vital signs show temperature 98.5.  Pulse 57.  Blood pressure 131/79.  Oxygen saturation is 97% on room air.  His abdomen is soft.  Bowel sounds are decreased.  There may be a little bit of distention.  There is no tenderness to palpation or rebound.  Lungs are clear.  Cardiac exam regular rate and rhythm.  Again, [name] has recurrent bowel obstruction.  I do suspect this is going to be secondary to malignancy.  He will go to surgery today.  I really hope that surgery will get some specimen from tumor so we can send it off for molecular analysis.  We will follow-up following his surgery.  I suspect that he will have another week in the hospital.  One of the staff on [address]. have done a fantastic job taking care of him.  I appreciate all of their hard work.  Lattie Haw, MD  Hebrews 12:12

## 2021-06-14 NOTE — Progress Notes (Signed)
Day of Surgery  Subjective: CC: NGT out.  Reports that his abdominal pain and distention has resolved.  He denies any nausea or vomiting.  He tolerated clears yesterday.  Passing flatus.  Notes liquidy BMs every 2 hours.  Anticipating surgery today.  Marked by [place] nurse yesterday  Objective: Vital signs in last 24 hours: Temp:  [98 F (36.7 C)-98.5 F (36.9 C)] 98.5 F (36.9 C) (06/27 0620) Pulse Rate:  [57-67] 57 (06/27 0620) Resp:  [18] 18 (06/27 0620) BP: (131-172)/(79-94) 131/79 (06/27 0620) SpO2:  [97 %-99 %] 97 % (06/27 0620) Weight:  [67 kg] 67 kg (06/27 0500) Last BM Date: 06/14/21  Intake/Output from previous day: 06/26 0701 - 06/27 0700 In: 3763.5 [P.O.:480; I.V.:2704.4; IV Piggyback:579.1] Out: 550 [Emesis/NG output:550] Intake/Output this shift: No intake/output data recorded.  PE: Gen:  Alert, NAD, pleasant Card:  RRR Pulm:  CTAB, no W/R/R, effort normal Abd: Soft, NT/ND, +BS, colostomy marking on left abdomen Psych: A&Ox3  Skin: no rashes noted, warm and dry   Lab Results:  Recent Labs    06/12/21 0246 06/14/21 0449  WBC 34.8* 20.3*  HGB 11.3* 11.3*  HCT 34.8* 35.1*  PLT 434* 418*   BMET Recent Labs    06/13/21 0504 06/14/21 0449  NA 140 139  K 3.0* 3.9  CL 106 110  CO2 26 25  GLUCOSE 121* 135*  BUN 24* 20  CREATININE 1.31* 1.19  CALCIUM 8.4* 8.1*   PT/INR Recent Labs    06/11/21 1907  LABPROT 15.7*  INR 1.3*   CMP     Component Value Date/Time   NA 139 06/14/2021 0449   NA 141 03/22/2017 1101   K 3.9 06/14/2021 0449   CL 110 06/14/2021 0449   CO2 25 06/14/2021 0449   GLUCOSE 135 (H) 06/14/2021 0449   BUN 20 06/14/2021 0449   BUN 17 03/22/2017 1101   CREATININE 1.19 06/14/2021 0449   CREATININE 1.71 (H) 04/28/2021 1032   CALCIUM 8.1 (L) 06/14/2021 0449   PROT 5.1 (L) 06/14/2021 0449   PROT 6.9 03/22/2017 1101   ALBUMIN 2.2 (L) 06/14/2021 0449   ALBUMIN 4.5 03/22/2017 1101   AST 16 06/14/2021 0449   AST 27  04/28/2021 1032   ALT 14 06/14/2021 0449   ALT 18 04/28/2021 1032   ALKPHOS 56 06/14/2021 0449   BILITOT 0.4 06/14/2021 0449   BILITOT 0.5 04/28/2021 1032   GFRNONAA >60 06/14/2021 0449   GFRNONAA 40 (L) 04/28/2021 1032   GFRAA >60 11/24/2019 1600   Lipase     Component Value Date/Time   LIPASE 33 06/09/2021 1223       Studies/Results: No results found.  Anti-infectives: Anti-infectives (From admission, onward)    Start     Dose/Rate Route Frequency Ordered Stop   06/14/21 0600  cefoTEtan (CEFOTAN) 2 g in dextrose 5 % 50 mL IVPB       Note to Pharmacy: Pharmacy may adjust dose strength for optimal dosing.   Send with patient on call to the OR.  Anesthesia to complete antibiotic administration <58min prior to incision per [facility].   2 g 100 mL/hr over 30 Minutes Intravenous On call to O.R. 06/13/21 1335 06/15/21 0559   06/13/21 1400  neomycin (MYCIFRADIN) tablet 1,000 mg        1,000 mg Oral 3 times per day on Sun 06/13/21 1333     06/13/21 1400  metroNIDAZOLE (FLAGYL) tablet 1,000 mg  Status:  Discontinued  Note to Pharmacy: Take 2 pills (=1000mg ) by mouth at 1pm, 3pm, and 10pm the day before your colorectal operation   1,000 mg Oral 3 times per day on Sun 06/13/21 1335 06/13/21 1404   06/13/21 1332  metroNIDAZOLE (FLAGYL) tablet 500 mg  Status:  Discontinued       Note to Pharmacy: Take 2 pills (=1000mg ) by mouth at 1pm, 3pm, and 10pm the day before your colorectal operation   500 mg Oral As directed 06/13/21 1333 06/13/21 1335   06/12/21 0000  Ampicillin-Sulbactam (UNASYN) 3 g in sodium chloride 0.9 % 100 mL IVPB        3 g 200 mL/hr over 30 Minutes Intravenous Every 6 hours 06/11/21 1955     06/11/21 1800  metroNIDAZOLE (FLAGYL) IVPB 500 mg  Status:  Discontinued        500 mg 100 mL/hr over 60 Minutes Intravenous Every 8 hours 06/11/21 1636 06/11/21 1949   06/11/21 1700  cefTRIAXone (ROCEPHIN) 2 g in sodium chloride 0.9 % 100 mL IVPB  Status:  Discontinued         2 g 200 mL/hr over 30 Minutes Intravenous Daily 06/11/21 1636 06/11/21 1949        Assessment/Plan Stricture/stenosis of sigmoid colon, likely metastatic disease - Patient is known to our service with multiple admissions for LBO. - Patient originally underwent flexible sigmoidoscopy and colonic stent placement on 6/8 by GI for stricture/stenosis of the sigmoid colon of unclear etiology during hospitalization from 5/31-6/8.   - Readmitted with LBO on 6/14. CT 06/01/21 revealed diffuse colonic and small bowel dilatation proximal to the sigmoid colon stent. Patient was managed with conservative management and ultimately improved, being able to be discharged on bowel regimen on 6/18. - Readmitted 6/23 for PO intolerance, abdominal pain, n/v and inability to pass flatus with CT that showed persistent bowel obstruction likely at the level of the proximal aspect of the stent in the sigmoid colon. NGT placed 6/24 - NGT out, pain resolved, having bowel function but as this is the patients 3rd admission for above agree w/ plan for surgery today with Dr. Hassell Done pending OR availability - Diagnostic laparoscopy, possible exploratory laparotomy, diverting colostomy, biopsy of mass causing colon obstruction, possible sigmoid colectomy   FEN - NPO, TNA, IVF per [facility] VTE - ok for prophylaxis from our standpoint ID - none indicated. Peri-op abx.    Hx of duodenal stricture for which he underwent stent placement 11/2019 Hx metastatic pancreatic cancer  - S/p distal pancreatectomy, splenectomy, and partial gastrectomy in 10/2017 at [place] - Prior CT scan shows progressive metastatic disease with new/enlarging pulmonary nodules and enlarging mesenteric soft tissue mass at the duodenal jejunal junction  - Dr. Marin Olp following. Was restarted on chemo but apparently hasn't had this in several weeks   LOS: 5 days    Jillyn Ledger , [facility] Surgery 06/14/2021, 10:21 AM Please see Amion for  pager number during day hours 7:00am-4:30pm

## 2021-06-14 NOTE — Progress Notes (Signed)
PHARMACY - TOTAL PARENTERAL NUTRITION CONSULT NOTE   Indication: Bowel obstruction  Patient Measurements: Height: 5\' 11"  (180.3 cm) Weight: 67 kg (147 lb 11.3 oz) IBW/kg (Calculated) : 75.3 TPN AdjBW (KG): 69.6 Body mass index is 20.6 kg/m. Usual Weight: 9 lb weight los in 5 weeks  Assessment:  80 year old male with medical history of metastatic pancreatic cancer s/p pancreatectomy, splenectomy, and partial gastrectomy in 10/2017 at [facility], recurrent large bowel obstructions, s/p colonic stent. He presented to the ED due to abdominal pain and was admitted for recurrent large bowel obstruction. Pharmacy consulted to start TPN.  Glucose / Insulin: No hx DM; on sensitive SSI q8h (pt's refusing insulin) - CBGs (goal <150): wnl Electrolytes: K improved to 3.9 (s/p 5 runs on 6/26), CorrCa 9.54; Mg 1.7, phos 2.9; CL and CO2 wnl Renal: CKDIIIa, SCr trending down 1.19 (crcl~47), BUN improved 20 Hepatic: LFTs WNL, Alb low at 2.2 Prealb: 14.4 (6/24), 7.8 (6/27) TG: 65 (6/25), 86 (6/27) Intake / Output:  - NG output: 550 ml - I/O: +3,213 mL in 24 hrs MIVF: none GI Imaging: - 06/01/21 CT: revealed diffuse colonic and small bowel dilatation proximal to the sigmoid colon stent. - 6/22 CT: persistent bowel obstruction likely at the level of the proximal aspect of the stent in the sigmoid colon. GI Surgeries / Procedures:  - 6/24: NG tube placed  Central access: implanted port TPN start date: 6/24  Nutritional Goals (per RD recommendation on 6/24): kCal: 2100-2350, Protein: 110-130, Fluid: ./= 2.3 L/day  Goal TPN rate is 90 mL/hr (provides 119 g of protein and 2155 kcals per day)  Current Nutrition:  NPO and TPN   Plan:  - Now: Magnesium sulfate 2gm IV x1  Continue TPN at goal rate 22mL/hr Electrolytes in TPN:  Na 64mEq/L K 50 mEq/L  Ca 41mEq/L Mg 5mEq/L Phos 51mmol/L Cl:Ac 1:1 Add standard MVI and trace elements to TPN Continue Sensitive q8h SSI and adjust as needed  MIVF per MD  - none currently Monitor TPN labs on Mon/Thurs BMET, phos and mag on 6/28  ++ plan for diverting colostomy on 6/27++  Dia Sitter, PharmD, BCPS 06/14/2021 7:19 AM

## 2021-06-15 ENCOUNTER — Encounter (HOSPITAL_COMMUNITY): Payer: Self-pay | Admitting: Surgery

## 2021-06-15 LAB — BASIC METABOLIC PANEL
Anion gap: 6 (ref 5–15)
BUN: 20 mg/dL (ref 8–23)
CO2: 23 mmol/L (ref 22–32)
Calcium: 7.9 mg/dL — ABNORMAL LOW (ref 8.9–10.3)
Chloride: 107 mmol/L (ref 98–111)
Creatinine, Ser: 1.46 mg/dL — ABNORMAL HIGH (ref 0.61–1.24)
GFR, Estimated: 49 mL/min — ABNORMAL LOW (ref >60)
Glucose, Bld: 121 mg/dL — ABNORMAL HIGH (ref 70–99)
Potassium: 4.5 mmol/L (ref 3.5–5.1)
Sodium: 136 mmol/L (ref 135–145)

## 2021-06-15 LAB — HEMOGLOBIN A1C
Hgb A1c MFr Bld: 6.2 % — ABNORMAL HIGH (ref 4.8–5.6)
Mean Plasma Glucose: 131 mg/dL

## 2021-06-15 LAB — ABO/RH: ABO/RH(D): B POS

## 2021-06-15 LAB — GLUCOSE, CAPILLARY
Glucose-Capillary: 120 mg/dL — ABNORMAL HIGH (ref 70–99)
Glucose-Capillary: 113 mg/dL — ABNORMAL HIGH (ref 70–99)
Glucose-Capillary: 129 mg/dL — ABNORMAL HIGH (ref 70–99)
Glucose-Capillary: 114 mg/dL — ABNORMAL HIGH (ref 70–99)

## 2021-06-15 LAB — PHOSPHORUS: Phosphorus: 3.6 mg/dL (ref 2.5–4.6)

## 2021-06-15 LAB — MAGNESIUM: Magnesium: 1.8 mg/dL (ref 1.7–2.4)

## 2021-06-15 MED ORDER — TRAVASOL 10 % IV SOLN
INTRAVENOUS | Status: DC
  Filled 2021-06-15: qty 1188

## 2021-06-15 MED ORDER — MORPHINE SULFATE (PF) 2 MG/ML IV SOLN: 2.0000 mg | INTRAVENOUS | Status: DC | PRN

## 2021-06-15 MED ORDER — DEXTROSE-NACL 5-0.45 % IV SOLN: INTRAVENOUS | Status: DC

## 2021-06-15 MED ORDER — BOOST / RESOURCE BREEZE PO LIQD CUSTOM
1.0000 | Freq: Three times a day (TID) | ORAL | Status: DC
  Administered 2021-06-15 – 2021-06-17 (×4): 1 via ORAL

## 2021-06-15 MED ORDER — INSULIN ASPART 100 UNIT/ML IJ SOLN
0.0000 [IU] | Freq: Four times a day (QID) | INTRAMUSCULAR | Status: DC
  Administered 2021-06-15: 3 [IU] via SUBCUTANEOUS
  Administered 2021-06-16: 4 [IU] via SUBCUTANEOUS

## 2021-06-15 MED ORDER — ACETAMINOPHEN 500 MG PO TABS
1000.0000 mg | ORAL_TABLET | Freq: Four times a day (QID) | ORAL | Status: DC | PRN
  Administered 2021-06-16 – 2021-06-18 (×5): 1000 mg via ORAL
  Filled 2021-06-15 (×5): qty 2

## 2021-06-15 MED ORDER — SODIUM CHLORIDE 0.9 % IV SOLN
3.0000 g | Freq: Four times a day (QID) | INTRAVENOUS | Status: AC
  Administered 2021-06-15 – 2021-06-17 (×11): 3 g via INTRAVENOUS
  Filled 2021-06-15: qty 3
  Filled 2021-06-15: qty 8
  Filled 2021-06-15: qty 3
  Filled 2021-06-15: qty 8
  Filled 2021-06-15: qty 3
  Filled 2021-06-15 (×2): qty 8
  Filled 2021-06-15 (×2): qty 3
  Filled 2021-06-15: qty 8
  Filled 2021-06-15: qty 3

## 2021-06-15 MED ORDER — MAGNESIUM SULFATE 2 GM/50ML IV SOLN
2.0000 g | Freq: Once | INTRAVENOUS | Status: AC
  Administered 2021-06-15: 2 g via INTRAVENOUS
  Filled 2021-06-15: qty 50

## 2021-06-15 MED ORDER — OXYCODONE HCL 5 MG PO TABS
5.0000 mg | ORAL_TABLET | ORAL | Status: DC | PRN
  Administered 2021-06-15: 5 mg via ORAL
  Administered 2021-06-15 – 2021-06-16 (×2): 10 mg via ORAL
  Filled 2021-06-15 (×2): qty 2
  Filled 2021-06-15: qty 1

## 2021-06-15 NOTE — Progress Notes (Signed)
PROGRESS NOTE  Kevin Villegas SWF:[id number] DOB: 1941/10/28 DOA: 06/09/2021 PCP: Dettinger, [name], MD  Brief History   80 year old man PMH metastatic pancreatic cancer, recurrent large bowel obstructions, status post colonic stent, presented with abdominal pain.  Admitted for recurrent large bowel obstruction.  Failed to improve with conservative management.  Followed by gastroenterology and general surgery.  Nutrition support routinely.  Underwent operative intervention 6/27.  Awaiting return of bowel function.  A & P  * Large bowel obstruction (HCC) secondary to colonic stricture -- Status post surgery with removal of stent, colostomy . -- Postoperative management per general surgery.  Await further recommendations from oncology.  Colon stricture ([place]) -- Status post surgery with removal of stent.  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)M1  with metastatic disease --Followed by Dr. Jorje Guild through [facility] health and Dr. Marin Olp through [facility].  Was started on gemcitabine/Abraxane chemotherapy on 05/05/21, has not received further infusions due to frequent hospitalization.  Acute hypoxemic respiratory failure (HCC) --secondary to aspiration, resolved  Aspiration pneumonitis (HCC) -- Hypoxia quickly resolved but given leukocytosis antibiotics continued.  Repeat CBC in AM.  Severe malnutrition ([place]) -- Patient wishes to avoid TNA.  Started on clear liquids.  If bowel function does not quickly return, will need to resume TNA.  Renal insufficiency -- Mild postoperatively.  IV fluids and clear liquids, repeat BMP in AM.  Aortic atherosclerosis (HCC) --no treatment indicated  Hydroureter on right --d/w Dr. Junious Silk, follow-up with urology as outpatient no stent for now, can add a lot of morbidity (pain, UTI, bleeding). Shockwave therapy would be considered --creatinine much improved  Disposition Plan:  Discussion:   Status is: Inpatient  Remains inpatient  appropriate because:IV treatments appropriate due to intensity of illness or inability to take PO and Inpatient level of care appropriate due to severity of illness  Dispo: The patient is from: Home              Anticipated d/c is to: Home              Patient currently is not medically stable to d/c.   Difficult to place patient No  DVT prophylaxis: heparin injection 5,000 Units Start: 06/14/21 2200 SCD's Start: 06/14/21 1848   Code Status: Full Code Level of care: Med-Surg Family Communication: none  Murray Hodgkins, MD  [facility] Direct contact: see [email or web address] (further directions at bottom of note if needed) 7PM-7AM contact night coverage as at bottom of note 06/15/2021, 1:07 PM  LOS: 6 days   Significant Hospital Events   6/22 admit for large bowel obstruction   Consults:  GI General surgery   Procedures:  6/27 Laparoscopic mobilization of colon and attempted mobilization of the pelvis; colotomy and removal of metal stent sending it to path for exam of tissue, 24 foley venting mucous fistula; end colostomy  Micro Data:  6/24 BC NGTD   Antimicrobials:  Unasyn 6/24 >    Interval History/Subjective  CC: f/u bowel obstruction  Feels better today.  Has already ambulated.  Refused TPN.  Review of Systems  Respiratory:  Negative for shortness of breath.    Objective   Vitals:  Vitals:   06/15/21 0416 06/15/21 1122  BP: 125/79 (!) 160/91  Pulse: 69 67  Resp: 20 19  Temp: 98.3 F (36.8 C) 97.6 F (36.4 C)  SpO2: 95% 96%    Exam: Physical Exam Vitals and nursing note reviewed.  Constitutional:      General: He is not in acute  distress.    Appearance: He is not ill-appearing.     Comments: Sitting in chair  Cardiovascular:     Rate and Rhythm: Normal rate and regular rhythm.  Pulmonary:     Effort: Pulmonary effort is normal. No respiratory distress.     Breath sounds: Normal breath sounds. No wheezing, rhonchi or rales.  Neurological:     Mental  Status: He is alert.  Psychiatric:        Mood and Affect: Mood normal.        Behavior: Behavior normal.   I have personally reviewed the labs and other data, making special note of:   Today's Data  CBG stable Creatinine up to 1.46 post-op Mg and phos noted  Scheduled Meds:  Chlorhexidine Gluconate Cloth  6 each Topical Daily   feeding supplement  1 Container Oral TID BM   heparin injection (subcutaneous)  5,000 Units Subcutaneous Q8H   insulin aspart  0-20 Units Subcutaneous Q6H   pantoprazole (PROTONIX) IV  40 mg Intravenous QHS   Continuous Infusions:  ampicillin-sulbactam (UNASYN) IV 3 g (06/15/21 0857)   dextrose 5 % and 0.45% NaCl 50 mL/hr at 06/15/21 1154   magnesium sulfate bolus IVPB      Principal Problem:   Large bowel obstruction (HCC) secondary to colonic stricture Active Problems:   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)M1  with metastatic disease   Colon stricture (HCC)   Severe malnutrition (HCC)   Aspiration pneumonitis (HCC)   Acute hypoxemic respiratory failure (HCC)   Renal insufficiency   Right ureteral stone   Hydroureter on right   Aortic atherosclerosis (HCC)   Protein-calorie malnutrition, severe   LOS: 6 days   How to contact the [facility] Attending or Consulting provider 7A - 7P or covering provider during after hours 7P -7A, for this patient?  Check the care team in [facility] and look for a) attending/consulting [facility] provider listed and b) the [facility] team listed Log into [email or web address] and use [place]'s universal password to access. If you do not have the password, please contact the hospital operator. Locate the [facility] provider you are looking for under [facility] and page to a number that you can be directly reached. If you still have difficulty reaching the provider, please page the [facility] (Director on Call) for the Hospitalists listed on amion for assistance.

## 2021-06-15 NOTE — Plan of Care (Signed)
  Problem: Activity: Goal: Risk for activity intolerance will decrease Outcome: Progressing   Problem: Nutrition: Goal: Adequate nutrition will be maintained Outcome: Progressing   Problem: Coping: Goal: Level of anxiety will decrease Outcome: Progressing   Problem: Pain Managment: Goal: General experience of comfort will improve Outcome: Progressing   

## 2021-06-15 NOTE — Care Management Important Message (Signed)
Important Message  Patient Details IM Letter given to the Patient. Name: Kevin Villegas MRN: [id number] Date of Birth: Sep 19, 1941   Medicare Important Message Given:  Yes     Kerin Salen 06/15/2021, 8:53 AM

## 2021-06-15 NOTE — Consult Note (Signed)
[place] Nurse ostomy consult note Pt had colostomy surgery performed yesterday.  Pouch is intact with good seal, small amt liquid brown stool.  Stoma is red and viable when visualized through pouch.   Pt states his wife will be here tomorrow at 1:00 and requests that the first post-op pouch change and teaching session be performed at that time.   Educational materials left in the room, along with 5 sets of barrier rings/pouches/wafers Enrolled patient in [place] program: Not yet Julien Girt MSN, [place], [place], [place], [place]

## 2021-06-15 NOTE — Progress Notes (Signed)
Looks like he did well with surgery yesterday.  He had a laparoscopic approach.  This should really help with the healing.  Biopsies were taken.  The metal stent was removed.  He looks quite good.  I am sure that he will get out of bed today.  He is at significant risk for thromboembolic disease.  He is on subcutaneous heparin.  His labs show white cell count of 40,000.  Hemoglobin 0.5.  Platelet count 486,000.  Hopefully he will be able to eat a little bit.  The TNA was stopped.  His vital signs are temperature 98.3.  Pulse 69.  Blood pressure 125/79.  His abdomen is soft.  He has colostomy in the left lower quadrant.  Bowel sounds are minimal.  His lungs are clear.  Cardiac exam regular rate and rhythm.  Hopefully, he can get the cardiac monitor off of him soon.  We will have to wait the pathology.  I really am looking forward to the pathologic results.  I really need to have specimen so we can send off for molecular markers.  I do appreciate the outstanding work from Dr. Hassell Done.  I know he did a outstanding job with the surgery.  I am sure this was complex surgery.  I appreciate the wonderful care that he is getting from all the staff on 4 E.  I appreciate all of their hard work and compassion.  Lattie Haw, MD  Psalm 97:10

## 2021-06-15 NOTE — Progress Notes (Signed)
1 Day Post-Op  Subjective: CC: Patient with some soreness of the abdomen when he coughs or moves in bed.  He has not gotten out of bed.  He has not had any clears yet.  Denies nausea or vomiting.  Some small output from his colostomy.  Mucous fistula in place.  Foley in place.  Objective: Vital signs in last 24 hours: Temp:  [98.1 F (36.7 C)-98.8 F (37.1 C)] 98.3 F (36.8 C) (06/28 0416) Pulse Rate:  [56-84] 69 (06/28 0416) Resp:  [12-20] 20 (06/28 0416) BP: (125-172)/(79-101) 125/79 (06/28 0416) SpO2:  [91 %-99 %] 95 % (06/28 0416) Last BM Date: 06/14/21  Intake/Output from previous day: 06/27 0701 - 06/28 0700 In: 2679.8 [I.V.:2329.8; IV Piggyback:350] Out: 1700 [Urine:1500; Stool:50; Blood:150] Intake/Output this shift: Total I/O In: 100 [IV Piggyback:100] Out: -   PE: Gen:  Alert, NAD, pleasant HEENT: EOM's intact, pupils equal and round Card:  RRR Pulm:  CTAB, no W/R/R, effort normal Abd: Soft, ND, appropriately tender, mucus fistula in place with 15fr Foley with some bloody output.  Colostomy in place with bloated, red viable stoma.  Small amount of liquid stool and back along with air.  Midline wound with honeycomb dressing in place.  Slightly hypoactive bowel sounds. Ext:  No LE edema Psych: A&Ox3  Skin: no rashes noted, warm and dry   Lab Results:  Recent Labs    06/14/21 0449 06/14/21 2138  WBC 20.3* 39.9*  HGB 11.3* 11.5*  HCT 35.1* 35.1*  PLT 418* 486*   BMET Recent Labs    06/14/21 0449 06/14/21 2138 06/15/21 0850  NA 139  --  136  K 3.9  --  4.5  CL 110  --  107  CO2 25  --  23  GLUCOSE 135*  --  121*  BUN 20  --  20  CREATININE 1.19 1.33* 1.46*  CALCIUM 8.1*  --  7.9*   PT/INR No results for input(s): LABPROT, INR in the last 72 hours. CMP     Component Value Date/Time   NA 136 06/15/2021 0850   NA 141 03/22/2017 1101   K 4.5 06/15/2021 0850   CL 107 06/15/2021 0850   CO2 23 06/15/2021 0850   GLUCOSE 121 (H) 06/15/2021  0850   BUN 20 06/15/2021 0850   BUN 17 03/22/2017 1101   CREATININE 1.46 (H) 06/15/2021 0850   CREATININE 1.71 (H) 04/28/2021 1032   CALCIUM 7.9 (L) 06/15/2021 0850   PROT 5.1 (L) 06/14/2021 0449   PROT 6.9 03/22/2017 1101   ALBUMIN 2.2 (L) 06/14/2021 0449   ALBUMIN 4.5 03/22/2017 1101   AST 16 06/14/2021 0449   AST 27 04/28/2021 1032   ALT 14 06/14/2021 0449   ALT 18 04/28/2021 1032   ALKPHOS 56 06/14/2021 0449   BILITOT 0.4 06/14/2021 0449   BILITOT 0.5 04/28/2021 1032   GFRNONAA 49 (L) 06/15/2021 0850   GFRNONAA 40 (L) 04/28/2021 1032   GFRAA >60 11/24/2019 1600   Lipase     Component Value Date/Time   LIPASE 33 06/09/2021 1223       Studies/Results: No results found.  Anti-infectives: Anti-infectives (From admission, onward)    Start     Dose/Rate Route Frequency Ordered Stop   06/15/21 0800  Ampicillin-Sulbactam (UNASYN) 3 g in sodium chloride 0.9 % 100 mL IVPB        3 g 200 mL/hr over 30 Minutes Intravenous Every 6 hours 06/15/21 0753     06/15/21 0200  cefoTEtan (CEFOTAN) 2 g in sodium chloride 0.9 % 100 mL IVPB        2 g 200 mL/hr over 30 Minutes Intravenous Every 12 hours 06/14/21 1847 06/15/21 0305   06/14/21 1145  ceFAZolin (ANCEF) 2-4 GM/100ML-% IVPB  Status:  Discontinued       Note to Pharmacy: Charmayne Sheer   : cabinet override      06/14/21 1145 06/14/21 1152   06/14/21 0600  cefoTEtan (CEFOTAN) 2 g in dextrose 5 % 50 mL IVPB       Note to Pharmacy: Pharmacy may adjust dose strength for optimal dosing.   Send with patient on call to the OR.  Anesthesia to complete antibiotic administration <70min prior to incision per [facility].   2 g 100 mL/hr over 30 Minutes Intravenous On call to O.R. 06/13/21 1335 06/14/21 1350   06/13/21 1400  neomycin (MYCIFRADIN) tablet 1,000 mg  Status:  Discontinued        1,000 mg Oral 3 times per day on Sun 06/13/21 1333 06/14/21 1847   06/13/21 1400  metroNIDAZOLE (FLAGYL) tablet 1,000 mg  Status:  Discontinued        Note to Pharmacy: Take 2 pills (=1000mg ) by mouth at 1pm, 3pm, and 10pm the day before your colorectal operation   1,000 mg Oral 3 times per day on Sun 06/13/21 1335 06/13/21 1404   06/13/21 1332  metroNIDAZOLE (FLAGYL) tablet 500 mg  Status:  Discontinued       Note to Pharmacy: Take 2 pills (=1000mg ) by mouth at 1pm, 3pm, and 10pm the day before your colorectal operation   500 mg Oral As directed 06/13/21 1333 06/13/21 1335   06/12/21 0000  Ampicillin-Sulbactam (UNASYN) 3 g in sodium chloride 0.9 % 100 mL IVPB  Status:  Discontinued        3 g 200 mL/hr over 30 Minutes Intravenous Every 6 hours 06/11/21 1955 06/14/21 1847   06/11/21 1800  metroNIDAZOLE (FLAGYL) IVPB 500 mg  Status:  Discontinued        500 mg 100 mL/hr over 60 Minutes Intravenous Every 8 hours 06/11/21 1636 06/11/21 1949   06/11/21 1700  cefTRIAXone (ROCEPHIN) 2 g in sodium chloride 0.9 % 100 mL IVPB  Status:  Discontinued        2 g 200 mL/hr over 30 Minutes Intravenous Daily 06/11/21 1636 06/11/21 1949        Assessment/Plan POD 1 s/p  Laparoscopic mobilization of colon and attempted mobilization of the pelvis; colotomy and removal of metal stent sending it to path for exam of tissue, 24 foley venting mucous fistula; end colostomy for Distal sigmoid colon obstruction with metal stent in place - Dr. Hassell Done - 06/14/21 - Okay for CLD. AROBF - WOCN following for new ostomy - Mucus fistula in place - Mobilize, PT - Pulm toilet - Am labs - Path pending  - D/C Foley. -  Patient would like to hold off on TPN.  We discussed risk of ileus and reasoning for TPN.  Patient elected to hold off on TPN currently and agreeable to restart if he develops an ileus.  Continue clears and boost breeze for now.  FEN - CLD, IVF per [facility]. Boost breeze. VTE - SCDs, heparin subq ID -cefotetan [place].  Unasyn postop for possible aspiration pneumonia per [facility]  Hx of duodenal stricture for which he underwent stent placement 11/2019 Hx  metastatic pancreatic cancer  - S/p distal pancreatectomy, splenectomy, and partial gastrectomy in 10/2017 at [place] -  Prior CT scan shows progressive metastatic disease with new/enlarging pulmonary nodules and enlarging mesenteric soft tissue mass at the duodenal jejunal junction  - Dr. Marin Olp following. Was restarted on chemo but apparently hasn't had this in several weeks   LOS: 6 days    Jillyn Ledger , [facility] Surgery 06/15/2021, 10:42 AM Please see Amion for pager number during day hours 7:00am-4:30pm

## 2021-06-15 NOTE — Progress Notes (Signed)
Pharmacy: unasyn  Patient is a 80 y.o M who is known to pharmacy from current TPN consult. Pharmacy has been consulted to resume unasyn for asp PNA.  - scr 1.33 (crcl~43)  Plan: - unasyn 3gm IV q6h - Pharmacy will sign off for abx. Re-consult [place] if need further assistance.  Dia Sitter, PharmD, BCPS 06/15/2021 8:04 AM

## 2021-06-15 NOTE — Progress Notes (Addendum)
PHARMACY - TOTAL PARENTERAL NUTRITION CONSULT NOTE   Indication: Bowel obstruction  Patient Measurements: Height: 5\' 11"  (180.3 cm) Weight: 67 kg (147 lb 11.3 oz) IBW/kg (Calculated) : 75.3 TPN AdjBW (KG): 69.6 Body mass index is 20.6 kg/m. Usual Weight: 9 lb weight los in 5 weeks  Assessment:  80 year old male with medical history of metastatic pancreatic cancer s/p pancreatectomy, splenectomy, and partial gastrectomy in 10/2017 at [facility], recurrent large bowel obstructions, s/p colonic stent. He presented to the ED due to abdominal pain and was admitted for recurrent large bowel obstruction. Pharmacy consulted to start TPN.  Glucose / Insulin: No hx DM; on sensitive SSI q8h--> changed to rSSI q4h by Dr. Hassell Done on 6/27 (used 7 units in the past 24 hrs) - CBGs (goal <150): all wnl except for one value that was 217 Electrolytes: K 4.5, CorrCa 9.54; Mg 1.8, phos up 3.6; CL and CO2 wnl Renal: CKDIIIa, SCr up 1.46 (crcl~39), BUN improved 20 Hepatic: LFTs WNL, Alb low at 2.2 Prealb: 14.4 (6/24), 7.8 (6/27) TG: 65 (6/25), 86 (6/27) Intake / Output:  - colostomy output: 50 mL - UOP: 1050 mL - I/O: +980 mL in 24 hrs MIVF: D5 0.45NS with 20 meq/L KCL at 50 ml/hr started by CCS on 6/27 s/p OR GI Imaging: - 06/01/21 CT: revealed diffuse colonic and small bowel dilatation proximal to the sigmoid colon stent. - 6/22 CT: persistent bowel obstruction likely at the level of the proximal aspect of the stent in the sigmoid colon. GI Surgeries / Procedures:  - 6/24: NG tube placed - 6/27: proximal diverting colostomy, creation of distal fistula  Central access: implanted port TPN start date: 6/24  ++ TPN d/ced on 6/27 after OR procedure. Spoke to CCS on 6/28. CCS would like to continue TPN at this time ++  Nutritional Goals (per RD recommendation on 6/24): kCal: 2100-2350, Protein: 110-130, Fluid: > 2.3 L/day  Goal TPN rate is 90 mL/hr (provides 119 g of protein and 2155 kcals per  day)  Current Nutrition:  NPO and TPN   Plan:   At 1800: - Resume TPN at goal rate 85mL/hr - Electrolytes in TPN:  Na 21mEq/L K 50 mEq/L  Ca 89mEq/L Mg 27mEq/L Phos 72mmol/L Cl:Ac 1:1 - Add standard MVI and trace elements to TPN - change resistant SSI frequency to q6h - MIVF per MD --> will remove KCL from IV fluid since scr is trending up and K is 4.5 - Monitor TPN labs on Mon/Thurs - CMET, phos and mag on 6/29  Dia Sitter, PharmD, BCPS 06/15/2021 7:52 AM  __________________________________ Adden: Per Dr. Sarajane Jews, pt does not want TPN. D/c TPN - Pharmacy will sign off. Re-consult [place] if need further assistance  Dia Sitter, PharmD, BCPS 06/15/2021 11:13 AM

## 2021-06-16 LAB — CBC
HCT: 29.3 % — ABNORMAL LOW (ref 39.0–52.0)
Hemoglobin: 9.6 g/dL — ABNORMAL LOW (ref 13.0–17.0)
MCH: 31.8 pg (ref 26.0–34.0)
MCHC: 32.8 g/dL (ref 30.0–36.0)
MCV: 97 fL (ref 80.0–100.0)
Platelets: 456 10*3/uL — ABNORMAL HIGH (ref 150–400)
RBC: 3.02 MIL/uL — ABNORMAL LOW (ref 4.22–5.81)
RDW: 15.2 % (ref 11.5–15.5)
WBC: 25.5 10*3/uL — ABNORMAL HIGH (ref 4.0–10.5)
nRBC: 0 % (ref 0.0–0.2)

## 2021-06-16 LAB — GLUCOSE, CAPILLARY
Glucose-Capillary: 102 mg/dL — ABNORMAL HIGH (ref 70–99)
Glucose-Capillary: 115 mg/dL — ABNORMAL HIGH (ref 70–99)
Glucose-Capillary: 120 mg/dL — ABNORMAL HIGH (ref 70–99)
Glucose-Capillary: 160 mg/dL — ABNORMAL HIGH (ref 70–99)

## 2021-06-16 LAB — BASIC METABOLIC PANEL
Anion gap: 5 (ref 5–15)
BUN: 19 mg/dL (ref 8–23)
CO2: 24 mmol/L (ref 22–32)
Calcium: 7.7 mg/dL — ABNORMAL LOW (ref 8.9–10.3)
Chloride: 108 mmol/L (ref 98–111)
Creatinine, Ser: 1.47 mg/dL — ABNORMAL HIGH (ref 0.61–1.24)
GFR, Estimated: 48 mL/min — ABNORMAL LOW (ref >60)
Glucose, Bld: 145 mg/dL — ABNORMAL HIGH (ref 70–99)
Potassium: 4 mmol/L (ref 3.5–5.1)
Sodium: 137 mmol/L (ref 135–145)

## 2021-06-16 LAB — CULTURE, BLOOD (ROUTINE X 2)
Culture: NO GROWTH
Culture: NO GROWTH
Special Requests: ADEQUATE
Special Requests: ADEQUATE

## 2021-06-16 LAB — SURGICAL PATHOLOGY

## 2021-06-16 LAB — HEMOGLOBIN A1C
Hgb A1c MFr Bld: 6.1 % — ABNORMAL HIGH (ref 4.8–5.6)
Mean Plasma Glucose: 128 mg/dL

## 2021-06-16 LAB — MAGNESIUM: Magnesium: 1.9 mg/dL (ref 1.7–2.4)

## 2021-06-16 LAB — PHOSPHORUS: Phosphorus: 3.1 mg/dL (ref 2.5–4.6)

## 2021-06-16 MED ORDER — PANTOPRAZOLE SODIUM 40 MG PO TBEC
40.0000 mg | DELAYED_RELEASE_TABLET | Freq: Every day | ORAL | Status: DC
  Administered 2021-06-16 – 2021-06-17 (×2): 40 mg via ORAL
  Filled 2021-06-16 (×2): qty 1

## 2021-06-16 MED ORDER — INSULIN ASPART 100 UNIT/ML IJ SOLN: 0.0000 [IU] | Freq: Three times a day (TID) | INTRAMUSCULAR | Status: DC

## 2021-06-16 MED ORDER — ENSURE ENLIVE PO LIQD
237.0000 mL | Freq: Two times a day (BID) | ORAL | Status: DC
  Administered 2021-06-17 (×2): 237 mL via ORAL

## 2021-06-16 NOTE — Plan of Care (Signed)

## 2021-06-16 NOTE — Progress Notes (Signed)
Patient ambulated in the room, but refused to ambulate in the hall. Patient stated that he wanted to wait until day shift.

## 2021-06-16 NOTE — Plan of Care (Signed)
  Problem: Nutrition: Goal: Adequate nutrition will be maintained Outcome: Progressing   Problem: Coping: Goal: Level of anxiety will decrease Outcome: Progressing   Problem: Education: Goal: Knowledge of General Education information will improve Description: Including pain rating scale, medication(s)/side effects and non-pharmacologic comfort measures Outcome: Completed/Met   Problem: Elimination: Goal: Will not experience complications related to bowel motility Outcome: Completed/Met Goal: Will not experience complications related to urinary retention Outcome: Completed/Met

## 2021-06-16 NOTE — TOC Initial Note (Signed)
Transition of Care [facility]) - Initial/Assessment Note    Patient Details  Name: Kevin Villegas MRN: [id number] Date of Birth: [birth year]  [facility] CM/SW Contact:    Dessa Phi, RN Phone Number: 06/16/2021, 9:55 AM  Clinical Narrative: Spoke to patient about d/c plans-plans to return home-declines [facility] nursing for ostomy instruction-has good support. Has own transportation home.                  Expected Discharge Plan: Home/Self Care Barriers to Discharge: Continued Medical Work up   Patient Goals and CMS Choice Patient states their goals for this hospitalization and ongoing recovery are:: go home CMS Medicare.gov Compare Post Acute Care list provided to:: Patient Choice offered to / list presented to : Patient  Expected Discharge Plan and Services Expected Discharge Plan: Home/Self Care   Discharge Planning Services: CM Consult   Living arrangements for the past 2 months: Single Family Home                           HH Arranged: Patient Refused HH          Prior Living Arrangements/Services Living arrangements for the past 2 months: Single Family Home Lives with:: Spouse Patient language and need for interpreter reviewed:: Yes Do you feel safe going back to the place where you live?: Yes      Need for Family Participation in Patient Care: No (Comment) Care giver support system in place?: Yes (comment)   Criminal Activity/Legal Involvement Pertinent to Current Situation/Hospitalization: No - Comment as needed  Activities of Daily Living Home Assistive Devices/Equipment: Eyeglasses ADL Screening (condition at time of admission) Patient's cognitive ability adequate to safely complete daily activities?: Yes Is the patient deaf or have difficulty hearing?: Yes Does the patient have difficulty seeing, even when wearing glasses/contacts?: No Does the patient have difficulty concentrating, remembering, or making decisions?: No Patient able to express  need for assistance with ADLs?: Yes Does the patient have difficulty dressing or bathing?: No Independently performs ADLs?: Yes (appropriate for developmental age) Does the patient have difficulty walking or climbing stairs?: No Weakness of Legs: None Weakness of Arms/Hands: None  Permission Sought/Granted Permission sought to share information with : Case Manager Permission granted to share information with : Yes, Verbal Permission Granted  Share Information with NAME: Case Manager     Permission granted to share info w Relationship: Butch Penny spouse [phone]     Emotional Assessment Appearance:: Appears stated age Attitude/Demeanor/Rapport: Gracious Affect (typically observed): Accepting Orientation: : Oriented to Self, Oriented to Place, Oriented to  Time Alcohol / Substance Use: Not Applicable Psych Involvement: No (comment)  Admission diagnosis:  Large bowel obstruction (HCC) [K56.609] Intractable vomiting with nausea, unspecified vomiting type [R11.2] Patient Active Problem List   Diagnosis Date Noted   Protein-calorie malnutrition, severe 06/12/2021   Aspiration pneumonitis ([place]) 06/12/2021   Acute hypoxemic respiratory failure ([place]) 06/12/2021   Hydroureter on right 06/10/2021   Aortic atherosclerosis ([place]) 06/10/2021   Renal insufficiency 06/09/2021   Dehydration 06/02/2021   Acute prerenal azotemia 06/02/2021   Leukocytosis 06/02/2021   Right ureteral stone 06/02/2021   Severe malnutrition ([place]) 06/02/2021   Large bowel obstruction ([place]) secondary to colonic stricture 06/01/2021   Pneumatosis coli    Colon stricture ([place])    Pancreatic mass 06/27/2019   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)M1  with metastatic disease 06/27/2019   Rhinitis medicamentosa 02/26/2019   PCP:  Dettinger, [name], MD Pharmacy:   [facility] [address], [place] [address] [place]  [address] [place] [address] Phone: [phone] Fax:  [phone]     Social Determinants of Health (SDOH) Interventions    Readmission Risk Interventions Readmission Risk Prevention Plan 06/16/2021 06/11/2021 06/03/2021  Transportation Screening Complete Complete Complete  PCP or Specialist Appt within 3-5 Days - Complete -  HRI or [place] - Complete Complete  Social Work Consult for [place] Planning/Counseling - Complete Complete  Palliative Care Screening - Not Applicable Not Applicable  Medication Review Press photographer) Complete Complete Complete  PCP or Specialist appointment within 3-5 days of discharge Complete - -  [place] or Home Care Consult Complete - -  SW Recovery Care/Counseling Consult Complete - -  Palliative Care Screening Not Applicable - -  [place] Not Applicable - -  Some recent data might be hidden

## 2021-06-16 NOTE — Progress Notes (Signed)
PROGRESS NOTE    Kevin Villegas  ACZ:[id number] DOB: [birth year] DOA: 06/09/2021 PCP: Dettinger, [name], MD     Brief Narrative:  Kevin Villegas is a 80 year old man PMH metastatic pancreatic cancer, recurrent large bowel obstructions, status post colonic stent, presented with abdominal pain.  Admitted for recurrent large bowel obstruction.  Failed to improve with conservative management.  Followed by gastroenterology and general surgery.  Nutrition support routinely.  Underwent operative intervention 6/27.    New events last 24 hours / Subjective: Tolerated diet yesterday.  He has no complaints of nausea, vomiting, abdominal pain.  Having some output from colostomy.  Assessment & Plan:   Principal Problem:   Large bowel obstruction (HCC) secondary to colonic stricture Active Problems:   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)M1  with metastatic disease   Colon stricture (HCC)   Right ureteral stone   Severe malnutrition (HCC)   Renal insufficiency   Hydroureter on right   Aortic atherosclerosis (HCC)   Protein-calorie malnutrition, severe   Aspiration pneumonitis (HCC)   Acute hypoxemic respiratory failure (HCC)   Recurrent large bowel obstruction secondary to colonic stricture -Status post surgery, colotomy, removal of stent, end colostomy -Per general surgery  Malignant neoplasm of tail of pancreas with metastatic disease -Followed by Dr. Jorje Guild through [facility] and Dr. Marin Olp through [facility].  Was started on gemcitabine/Abraxane chemotherapy on 05/05/21, has not received further infusions due to frequent hospitalization  Acute hypoxemic respiratory failure secondary to aspiration -Resolved, on room air this morning -Continue Unasyn  Severe malnutrition -Tolerated regular diet  AKI -Baseline creatinine around 1.3-1.4 -Improved  Hydroureter on right -Per Dr. Junious Silk, can follow-up as outpatient, no stent for  now  Prediabetes -Hemoglobin A1c 6.1 -SSI  DVT prophylaxis:  heparin injection 5,000 Units Start: 06/14/21 2200 SCD's Start: 06/14/21 1848  Code Status:     Code Status Orders  (From admission, onward)           Start     Ordered   06/09/21 2200  Full code  Continuous        06/09/21 2200           Code Status History     Date Active Date Inactive Code Status Order ID Comments User Context   06/01/2021 2102 06/05/2021 1830 Full Code [id number]  Rhetta Mura, DO ED   05/18/2021 1200 05/26/2021 1958 Partial Code [id number]  Jonnie Finner, DO Inpatient   12/12/2014 1730 12/17/2014 1704 Full Code [id number]  Velvet Bathe, MD Inpatient      Advance Directive Documentation    Flowsheet Row Most Recent Value  Type of Advance Directive Healthcare Power of [place], Living will  Pre-existing out of facility DNR order (yellow form or pink MOST form) --  "MOST" Form in Place? --      Family Communication: No family at bedside Disposition Plan:  Status is: Inpatient  Remains inpatient appropriate because:Inpatient level of care appropriate due to severity of illness  Dispo: The patient is from: Home              Anticipated d/c is to: Home              Patient currently is not medically stable to d/c.   Difficult to place patient No      Antimicrobials:  Anti-infectives (From admission, onward)    Start     Dose/Rate Route Frequency Ordered Stop   06/15/21 0800  Ampicillin-Sulbactam (UNASYN) 3 g in sodium  chloride 0.9 % 100 mL IVPB        3 g 200 mL/hr over 30 Minutes Intravenous Every 6 hours 06/15/21 0753     06/15/21 0200  cefoTEtan (CEFOTAN) 2 g in sodium chloride 0.9 % 100 mL IVPB        2 g 200 mL/hr over 30 Minutes Intravenous Every 12 hours 06/14/21 1847 06/15/21 0305   06/14/21 1145  ceFAZolin (ANCEF) 2-4 GM/100ML-% IVPB  Status:  Discontinued       Note to Pharmacy: Charmayne Sheer   : cabinet override      06/14/21 1145 06/14/21 1152    06/14/21 0600  cefoTEtan (CEFOTAN) 2 g in dextrose 5 % 50 mL IVPB       Note to Pharmacy: Pharmacy may adjust dose strength for optimal dosing.   Send with patient on call to the OR.  Anesthesia to complete antibiotic administration <72min prior to incision per [facility].   2 g 100 mL/hr over 30 Minutes Intravenous On call to O.R. 06/13/21 1335 06/14/21 1350   06/13/21 1400  neomycin (MYCIFRADIN) tablet 1,000 mg  Status:  Discontinued        1,000 mg Oral 3 times per day on Sun 06/13/21 1333 06/14/21 1847   06/13/21 1400  metroNIDAZOLE (FLAGYL) tablet 1,000 mg  Status:  Discontinued       Note to Pharmacy: Take 2 pills (=1000mg ) by mouth at 1pm, 3pm, and 10pm the day before your colorectal operation   1,000 mg Oral 3 times per day on Sun 06/13/21 1335 06/13/21 1404   06/13/21 1332  metroNIDAZOLE (FLAGYL) tablet 500 mg  Status:  Discontinued       Note to Pharmacy: Take 2 pills (=1000mg ) by mouth at 1pm, 3pm, and 10pm the day before your colorectal operation   500 mg Oral As directed 06/13/21 1333 06/13/21 1335   06/12/21 0000  Ampicillin-Sulbactam (UNASYN) 3 g in sodium chloride 0.9 % 100 mL IVPB  Status:  Discontinued        3 g 200 mL/hr over 30 Minutes Intravenous Every 6 hours 06/11/21 1955 06/14/21 1847   06/11/21 1800  metroNIDAZOLE (FLAGYL) IVPB 500 mg  Status:  Discontinued        500 mg 100 mL/hr over 60 Minutes Intravenous Every 8 hours 06/11/21 1636 06/11/21 1949   06/11/21 1700  cefTRIAXone (ROCEPHIN) 2 g in sodium chloride 0.9 % 100 mL IVPB  Status:  Discontinued        2 g 200 mL/hr over 30 Minutes Intravenous Daily 06/11/21 1636 06/11/21 1949        Objective: Vitals:   06/15/21 0416 06/15/21 1122 06/15/21 1930 06/16/21 0552  BP: 125/79 (!) 160/91 135/85 (!) 144/81  Pulse: 69 67 70 66  Resp: 20 19 20 12   Temp: 98.3 F (36.8 C) 97.6 F (36.4 C) 98.4 F (36.9 C) 98.4 F (36.9 C)  TempSrc: Oral  Oral   SpO2: 95% 96% 97% 96%  Weight:      Height:         Intake/Output Summary (Last 24 hours) at 06/16/2021 1215 Last data filed at 06/16/2021 1128 Gross per 24 hour  Intake 2395.36 ml  Output 400 ml  Net 1995.36 ml   Filed Weights   06/12/21 0500 06/13/21 0500 06/14/21 0500  Weight: 70.5 kg 67.2 kg 67 kg    Examination:  General exam: Appears calm and comfortable  Respiratory system: Clear to auscultation. Respiratory effort normal. No respiratory distress. No  conversational dyspnea.  On room air Cardiovascular system: S1 & S2 heard, RRR. No murmurs. No pedal edema. Gastrointestinal system: Abdomen is nondistended, soft, + colostomy in place Central nervous system: Alert and oriented. No focal neurological deficits. Speech clear.  Extremities: Symmetric in appearance  Skin: No rashes, lesions or ulcers on exposed skin  Psychiatry: Judgement and insight appear normal. Mood & affect appropriate.   Data Reviewed: I have personally reviewed following labs and imaging studies  CBC: Recent Labs  Lab 06/09/21 1223 06/10/21 0350 06/11/21 1907 06/12/21 0246 06/14/21 0449 06/14/21 2138 06/16/21 0120  WBC 13.8*   < > 18.1* 34.8* 20.3* 39.9* 25.5*  NEUTROABS 9.8*  --   --   --  16.2*  --   --   HGB 14.5   < > 12.3* 11.3* 11.3* 11.5* 9.6*  HCT 43.3   < > 38.0* 34.8* 35.1* 35.1* 29.3*  MCV 95.8   < > 98.7 99.1 98.6 97.5 97.0  PLT 530*   < > 455* 434* 418* 486* 456*   < > = values in this interval not displayed.   Basic Metabolic Panel: Recent Labs  Lab 06/12/21 0246 06/13/21 0504 06/14/21 0449 06/14/21 2138 06/15/21 0850 06/16/21 0120  NA 137 140 139  --  136 137  K 3.8 3.0* 3.9  --  4.5 4.0  CL 105 106 110  --  107 108  CO2 22 26 25   --  23 24  GLUCOSE 118* 121* 135*  --  121* 145*  BUN 32* 24* 20  --  20 19  CREATININE 1.61* 1.31* 1.19 1.33* 1.46* 1.47*  CALCIUM 8.1* 8.4* 8.1*  --  7.9* 7.7*  MG 1.7 2.1 1.7  --  1.8 1.9  PHOS 3.2 2.7 2.9  --  3.6 3.1   GFR: Estimated Creatinine Clearance: 38.6 mL/min (A) (by C-G  formula based on SCr of 1.47 mg/dL (H)). Liver Function Tests: Recent Labs  Lab 06/09/21 1223 06/11/21 1907 06/12/21 0246 06/13/21 0504 06/14/21 0449  AST 33 27 24 18 16   ALT 31 23 23 15 14   ALKPHOS 78 69 65 64 56  BILITOT 0.4 0.4 0.6 0.4 0.4  PROT 7.4 5.5* 5.3* 5.7* 5.1*  ALBUMIN 3.6 2.5* 2.7* 2.6* 2.2*   Recent Labs  Lab 06/09/21 1223  LIPASE 33   No results for input(s): AMMONIA in the last 168 hours. Coagulation Profile: Recent Labs  Lab 06/11/21 1907  INR 1.3*   Cardiac Enzymes: No results for input(s): CKTOTAL, CKMB, CKMBINDEX, TROPONINI in the last 168 hours. BNP (last 3 results) No results for input(s): PROBNP in the last 8760 hours. HbA1C: Recent Labs    06/14/21 0449 06/14/21 2138  HGBA1C 6.2* 6.1*   CBG: Recent Labs  Lab 06/15/21 1117 06/15/21 1618 06/16/21 0007 06/16/21 0550 06/16/21 1153  GLUCAP 129* 114* 160* 102* 115*   Lipid Profile: Recent Labs    06/14/21 0449  TRIG 86   Thyroid Function Tests: No results for input(s): TSH, T4TOTAL, FREET4, T3FREE, THYROIDAB in the last 72 hours. Anemia Panel: No results for input(s): VITAMINB12, FOLATE, FERRITIN, TIBC, IRON, RETICCTPCT in the last 72 hours. Sepsis Labs: Recent Labs  Lab 06/11/21 1856 06/11/21 1907 06/11/21 2233 06/12/21 0246  PROCALCITON  --  3.25  --   --   LATICACIDVEN 3.1*  --  2.5* 2.4*    Recent Results (from the past 240 hour(s))  Resp Panel by RT-PCR (Flu A&B, Covid) Nasopharyngeal Swab     Status: None  Collection Time: 06/09/21  1:41 PM   Specimen: Nasopharyngeal Swab; Nasopharyngeal(NP) swabs in vial transport medium  Result Value Ref Range Status   SARS Coronavirus 2 by RT PCR NEGATIVE NEGATIVE Final    Comment: (NOTE) SARS-CoV-2 target nucleic acids are NOT DETECTED.  The SARS-CoV-2 RNA is generally detectable in upper respiratory specimens during the acute phase of infection. The lowest concentration of SARS-CoV-2 viral copies this assay can detect  is 138 copies/mL. A negative result does not preclude SARS-Cov-2 infection and should not be used as the sole basis for treatment or other patient management decisions. A negative result may occur with  improper specimen collection/handling, submission of specimen other than nasopharyngeal swab, presence of viral mutation(s) within the areas targeted by this assay, and inadequate number of viral copies(<138 copies/mL). A negative result must be combined with clinical observations, patient history, and epidemiological information. The expected result is Negative.  Fact Sheet for Patients:  EntrepreneurPulse.com.au  Fact Sheet for Healthcare Providers:  IncredibleEmployment.be  This test is no t yet approved or cleared by the [place] FDA and  has been authorized for detection and/or diagnosis of SARS-CoV-2 by FDA under an Emergency Use Authorization (EUA). This EUA will remain  in effect (meaning this test can be used) for the duration of the COVID-19 declaration under Section 564(b)(1) of the Act, 21 U.S.C.section 360bbb-3(b)(1), unless the authorization is terminated  or revoked sooner.       Influenza A by PCR NEGATIVE NEGATIVE Final   Influenza B by PCR NEGATIVE NEGATIVE Final    Comment: (NOTE) The Xpert Xpress SARS-CoV-2/FLU/RSV plus assay is intended as an aid in the diagnosis of influenza from Nasopharyngeal swab specimens and should not be used as a sole basis for treatment. Nasal washings and aspirates are unacceptable for Xpert Xpress SARS-CoV-2/FLU/RSV testing.  Fact Sheet for Patients: EntrepreneurPulse.com.au  Fact Sheet for Healthcare Providers: IncredibleEmployment.be  This test is not yet approved or cleared by the [place] FDA and has been authorized for detection and/or diagnosis of SARS-CoV-2 by FDA under an Emergency Use Authorization (EUA). This EUA will remain in effect  (meaning this test can be used) for the duration of the COVID-19 declaration under Section 564(b)(1) of the Act, 21 U.S.C. section 360bbb-3(b)(1), unless the authorization is terminated or revoked.  Performed at [facility], [address] [place], [place] [address]   Culture, blood (x 2)     Status: None   Collection Time: 06/11/21  5:24 PM   Specimen: BLOOD  Result Value Ref Range Status   Specimen Description   Final    BLOOD LEFT ANTECUBITAL UPPER Performed at [facility] [address] [place], [place] [address]    Special Requests   Final    Blood Culture adequate volume BOTTLES DRAWN AEROBIC AND ANAEROBIC   Culture   Final    NO GROWTH 5 DAYS Performed at [facility], [place] [address] [place], [place] [address]    Report Status 06/16/2021 FINAL  Final  Culture, blood (x 2)     Status: None   Collection Time: 06/11/21  5:25 PM   Specimen: BLOOD  Result Value Ref Range Status   Specimen Description   Final    BLOOD LEFT ANTECUBITAL LOWER Performed at [facility] [address] [place], [place] [address]    Special Requests   Final    BOTTLES DRAWN AEROBIC AND ANAEROBIC Blood Culture adequate volume Performed at [facility]  [place] [address], [place], [place] [address]    Culture   Final    NO GROWTH 5 DAYS Performed at [facility], [place] [address] [place],  [address]    Report Status 06/16/2021 FINAL  Final      Radiology Studies: No results found.    Scheduled Meds:  Chlorhexidine Gluconate Cloth  6 each Topical Daily   feeding supplement  1 Container Oral TID BM   heparin injection (subcutaneous)  5,000 Units Subcutaneous Q8H   insulin aspart  0-20 Units Subcutaneous Q6H   pantoprazole  40 mg Oral QHS   Continuous Infusions:  ampicillin-sulbactam (UNASYN) IV 3 g (06/16/21 0850)   dextrose 5 % and 0.45% NaCl 50 mL/hr at 06/15/21 1154     LOS: 7 days       Time spent: 25 minutes   Dessa Phi, DO [facility] 06/16/2021, 12:15 PM   Available via Epic secure chat 7am-7pm After these hours, please refer to coverage provider listed on [email or web address]

## 2021-06-16 NOTE — Progress Notes (Signed)
Mr. Aguayo really looks good.  His cardiac monitor has been taken off.  He is ambulating a little bit.  He is starting to eat a little bit more.  He is much more enthusiastic now.  We are awaiting the pathology.  I suspect this probably will be until tomorrow.  Once we have the pathology, we can send off for molecular markers.  His lab work shows a white cell count 25.5.  Hemoglobin 9.6.  Platelet count 456,000.  His calcium is 7.7.  His creatinine is 1.47.  This is trending up just a little bit.  He is not really complain a lot of pain.  His ostomy is draining nicely.  He still has a drainage catheter in.  His vital signs show temperature 98.4.  Pulse 66.  Blood pressure 144/81.  His abdomen is soft.  Does have bowel sounds.  The ostomy is intact in the left lower quadrant.  There is no distention.  He has little bit of blood from the drainage catheter site.  His lungs are clear.  Cardiac exam regular rate and rhythm.  I am just happy that Mr. Reinders had a surgery.  He is gotten through this quite nicely.  He is in great shape.  We can certainly be aggressive with systemic therapy once he is physically ready.  I told him that it probably would be a couple weeks after his discharge that we would be able to start treatment.  I would like to see him heal up a little bit more.  Thankfully, he had a laparoscopic surgery so that he should be able to heal up more quickly.  I do appreciate the outstanding care he is getting from all staff up on [address].  They are working really hard to get him better so he can go home.  Lattie Haw, MD  Philippians 3:14

## 2021-06-16 NOTE — Progress Notes (Signed)
Nutrition Follow-up  DOCUMENTATION CODES:   Severe malnutrition in context of chronic illness  INTERVENTION:   -Boost Breeze po TID, each supplement provides 250 kcal and 9 grams of protein  -Ensure Enlive po BID, each supplement provides 350 kcal and 20 grams of protein  NUTRITION DIAGNOSIS:   Severe Malnutrition related to chronic illness, cancer and cancer related treatments as evidenced by moderate fat depletion, moderate muscle depletion, severe muscle depletion.  Ongoing  GOAL:   Patient will meet greater than or equal to 90% of their needs  Progressing  MONITOR:   Labs, Weight trends, I & O's  ASSESSMENT:   80 year old male with medical history of metastatic pancreatic cancer, recurrent large bowel obstructions, s/p colonic stent. He presented to the ED due to abdominal pain and was admitted for recurrent large bowel obstruction.  6/24: NGT placed, TPN initiated 6/27: s/p Laparoscopic mobilization of colon and attempted mobilization of the pelvis; colotomy and removal of metal stent sending it to path for exam of tissue, 24 foley venting mucous fistula; end colostomy for Distal sigmoid colon obstruction with metal stent in place 6/28: TPN off  Patient now on regular diet and tolerating. Consuming 50-75% of meals. Accepting 1 Boost Breeze daily. Will add Ensure supplements as well. Per surgery note, pt may discharge tomorrow 6/30.  Admission weight: 153 lbs Current weight: 147 lbs  Medications reviewed.  Labs reviewed: CBGs: 102-160   Diet Order:   Diet Order             Diet regular Room service appropriate? Yes; Fluid consistency: Thin  Diet effective now                   EDUCATION NEEDS:   Not appropriate for education at this time  Skin:  Skin Assessment: Reviewed RN Assessment  Last BM:  6/29-colostomy  Height:   Ht Readings from Last 1 Encounters:  06/09/21 5\' 11"  (1.803 m)    Weight:   Wt Readings from Last 1 Encounters:   06/14/21 67 kg    Ideal Body Weight:  78.2 kg  BMI:  Body mass index is 20.6 kg/m.  Estimated Nutritional Needs:   Kcal:  2100-2350 kcal  Protein:  110-130 grams  Fluid:  >/= 2.3 L/day   Clayton Bibles, MS, RD, LDN Inpatient Clinical Dietitian Contact information available via Amion

## 2021-06-16 NOTE — Progress Notes (Signed)
PT Cancellation/Screen Note  Patient Details Name: Kevin Villegas MRN: [id number] DOB: 05-03-1941   Cancelled Treatment:    Reason Eval/Treat Not Completed: PT screened, no needs identified, will sign off. Pt denies need for PT services. Reports he has been walking hallways Mod Ind. Will sign off at pt's request. Thanks   Doreatha Massed, PT Acute Rehabilitation  Office: [phone] Pager: [phone]      ,

## 2021-06-16 NOTE — Consult Note (Addendum)
[place] Nurse ostomy follow up Pt and wife participated in first post-op pouch change and teaching session. Stoma type/location: colostomy stoma is red and viable, slightly above skin level, 1 1/2 inches Peristomal assessment: intact skin surrounding Output: 50cc liquid brown stool in pouch Ostomy pouching: 2 pc.  Education provided: Demonstrated application of 2 piece pouching system and added barrier ring to attempt to maintain a seal (barrier ring # [id number], wafer #2, pouch # 649).  Pt and wife are able to open and close to empty.  Discussed pouching routines and ordering supplies. Pt thinks he will prefer use of one piece pouches and I will order free samples sent to his house. 4 sets of barrier rings/wafers/pouches left at the bedside, along with educational materials. Pt asked appropriate questions. Enrolled patient in [facility] Start Discharge program: Yes Julien Girt MSN, RN, [place], [place], [place]

## 2021-06-16 NOTE — Progress Notes (Signed)
2 Days Post-Op  Subjective: CC: He is having some soreness around the incisions but overall his pain is well controlled.  He is mobilizing well.  Tolerating regular diet without any nausea or vomiting.  Having colostomy output.  He and his wife are meeting with [facility] nurse today for colostomy teaching.  Foley out and voiding.  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  Objective: Vital signs in last 24 hours: Temp:  [98.4 F (36.9 C)] 98.4 F (36.9 C) (06/29 0552) Pulse Rate:  [66-70] 66 (06/29 0552) Resp:  [12-20] 12 (06/29 0552) BP: (135-144)/(81-85) 144/81 (06/29 0552) SpO2:  [96 %-97 %] 96 % (06/29 0552) Last BM Date: 06/15/21  Intake/Output from previous day: 06/28 0701 - 06/29 0700 In: 2735.4 [P.O.:1420; I.V.:905; IV Piggyback:410.4] Out: 100 [Stool:100] Intake/Output this shift: Total I/O In: -  Out: 300 [Stool:300]  PE: Gen:  Alert, NAD, pleasant HEENT: EOM's intact, pupils equal and round Card:  RRR Pulm:  CTAB, no W/R/R, effort normal Abd: Soft, ND, appropriately tender, mucous fistula in place with 79fr Foley with some bloody output.  Colostomy in place with red viable stoma.  Small amount of liquid stool, along with air in bag.  Incisions c/d/I. +BS Ext:  No LE edema Psych: A&Ox3 Skin: no rashes noted, warm and dry  Lab Results:  Recent Labs    06/14/21 2138 06/16/21 0120  WBC 39.9* 25.5*  HGB 11.5* 9.6*  HCT 35.1* 29.3*  PLT 486* 456*   BMET Recent Labs    06/15/21 0850 06/16/21 0120  NA 136 137  K 4.5 4.0  CL 107 108  CO2 23 24  GLUCOSE 121* 145*  BUN 20 19  CREATININE 1.46* 1.47*  CALCIUM 7.9* 7.7*   PT/INR No results for input(s): LABPROT, INR in the last 72 hours. CMP     Component Value Date/Time   NA 137 06/16/2021 0120   NA 141 03/22/2017 1101   K 4.0 06/16/2021 0120   CL 108 06/16/2021 0120   CO2 24 06/16/2021 0120   GLUCOSE 145 (H) 06/16/2021 0120   BUN 19  06/16/2021 0120   BUN 17 03/22/2017 1101   CREATININE 1.47 (H) 06/16/2021 0120   CREATININE 1.71 (H) 04/28/2021 1032   CALCIUM 7.7 (L) 06/16/2021 0120   PROT 5.1 (L) 06/14/2021 0449   PROT 6.9 03/22/2017 1101   ALBUMIN 2.2 (L) 06/14/2021 0449   ALBUMIN 4.5 03/22/2017 1101   AST 16 06/14/2021 0449   AST 27 04/28/2021 1032   ALT 14 06/14/2021 0449   ALT 18 04/28/2021 1032   ALKPHOS 56 06/14/2021 0449   BILITOT 0.4 06/14/2021 0449   BILITOT 0.5 04/28/2021 1032   GFRNONAA 48 (L) 06/16/2021 0120   GFRNONAA 40 (L) 04/28/2021 1032   GFRAA >60 11/24/2019 1600   Lipase     Component Value Date/Time   LIPASE 33 06/09/2021 1223       Studies/Results: No results found.  Anti-infectives: Anti-infectives (From admission, onward)    Start     Dose/Rate Route Frequency Ordered Stop   06/15/21 0800  Ampicillin-Sulbactam (UNASYN) 3 g in sodium chloride 0.9 % 100 mL IVPB        3 g 200 mL/hr over 30 Minutes Intravenous Every 6 hours 06/15/21 0753     06/15/21 0200  cefoTEtan (CEFOTAN) 2 g in sodium chloride 0.9 % 100 mL IVPB  2 g 200 mL/hr over 30 Minutes Intravenous Every 12 hours 06/14/21 1847 06/15/21 0305   06/14/21 1145  ceFAZolin (ANCEF) 2-4 GM/100ML-% IVPB  Status:  Discontinued       Note to Pharmacy: Charmayne Sheer   : cabinet override      06/14/21 1145 06/14/21 1152   06/14/21 0600  cefoTEtan (CEFOTAN) 2 g in dextrose 5 % 50 mL IVPB       Note to Pharmacy: Pharmacy may adjust dose strength for optimal dosing.   Send with patient on call to the OR.  Anesthesia to complete antibiotic administration <20min prior to incision per [facility].   2 g 100 mL/hr over 30 Minutes Intravenous On call to O.R. 06/13/21 1335 06/14/21 1350   06/13/21 1400  neomycin (MYCIFRADIN) tablet 1,000 mg  Status:  Discontinued        1,000 mg Oral 3 times per day on Sun 06/13/21 1333 06/14/21 1847   06/13/21 1400  metroNIDAZOLE (FLAGYL) tablet 1,000 mg  Status:  Discontinued       Note to  Pharmacy: Take 2 pills (=1000mg ) by mouth at 1pm, 3pm, and 10pm the day before your colorectal operation   1,000 mg Oral 3 times per day on Sun 06/13/21 1335 06/13/21 1404   06/13/21 1332  metroNIDAZOLE (FLAGYL) tablet 500 mg  Status:  Discontinued       Note to Pharmacy: Take 2 pills (=1000mg ) by mouth at 1pm, 3pm, and 10pm the day before your colorectal operation   500 mg Oral As directed 06/13/21 1333 06/13/21 1335   06/12/21 0000  Ampicillin-Sulbactam (UNASYN) 3 g in sodium chloride 0.9 % 100 mL IVPB  Status:  Discontinued        3 g 200 mL/hr over 30 Minutes Intravenous Every 6 hours 06/11/21 1955 06/14/21 1847   06/11/21 1800  metroNIDAZOLE (FLAGYL) IVPB 500 mg  Status:  Discontinued        500 mg 100 mL/hr over 60 Minutes Intravenous Every 8 hours 06/11/21 1636 06/11/21 1949   06/11/21 1700  cefTRIAXone (ROCEPHIN) 2 g in sodium chloride 0.9 % 100 mL IVPB  Status:  Discontinued        2 g 200 mL/hr over 30 Minutes Intravenous Daily 06/11/21 1636 06/11/21 1949        Assessment/Plan POD 2 s/p Laparoscopic mobilization of colon and attempted mobilization of the pelvis; colotomy and removal of metal stent sending it to path for exam of tissue, 24 foley venting mucous fistula; end colostomy for Distal sigmoid colon obstruction with metal stent in place - Dr. Hassell Done - 06/14/21 -Tolerating regular diet had return of bowel function - [facility] following for new ostomy.  Plans to meet with patient and his wife at 1 PM today for teaching - Mucus fistula in place -Dr. Hassell Done would like this to stay in place for a few weeks and then study with contrast as outpatient - Mobilize, PT signed off - Pulm toilet - Path pending - Suspect he may be ready for discharge tomorrow   FEN - Reg VTE - SCDs, heparin subq ID - Cefotetan [place].  Unasyn postop for possible aspiration pneumonia per [facility] Foley - None, voiding Follow-Up - Dr. 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 Marin Olp following.    LOS: 7 days    Jillyn Ledger , [facility] Surgery 06/16/2021, 12:13 PM Please see Amion for pager number during day hours 7:00am-4:30pm

## 2021-06-17 LAB — BASIC METABOLIC PANEL
Anion gap: 5 (ref 5–15)
BUN: 17 mg/dL (ref 8–23)
CO2: 27 mmol/L (ref 22–32)
Calcium: 8 mg/dL — ABNORMAL LOW (ref 8.9–10.3)
Chloride: 105 mmol/L (ref 98–111)
Creatinine, Ser: 1.54 mg/dL — ABNORMAL HIGH (ref 0.61–1.24)
GFR, Estimated: 46 mL/min — ABNORMAL LOW (ref >60)
Glucose, Bld: 100 mg/dL — ABNORMAL HIGH (ref 70–99)
Potassium: 4 mmol/L (ref 3.5–5.1)
Sodium: 137 mmol/L (ref 135–145)

## 2021-06-17 LAB — CBC
HCT: 28.5 % — ABNORMAL LOW (ref 39.0–52.0)
Hemoglobin: 9.4 g/dL — ABNORMAL LOW (ref 13.0–17.0)
MCH: 32.2 pg (ref 26.0–34.0)
MCHC: 33 g/dL (ref 30.0–36.0)
MCV: 97.6 fL (ref 80.0–100.0)
Platelets: 498 10*3/uL — ABNORMAL HIGH (ref 150–400)
RBC: 2.92 MIL/uL — ABNORMAL LOW (ref 4.22–5.81)
RDW: 15.3 % (ref 11.5–15.5)
WBC: 18 10*3/uL — ABNORMAL HIGH (ref 4.0–10.5)
nRBC: 0.1 % (ref 0.0–0.2)

## 2021-06-17 LAB — GLUCOSE, CAPILLARY: Glucose-Capillary: 90 mg/dL (ref 70–99)

## 2021-06-17 NOTE — Progress Notes (Signed)
Kevin Villegas continues to improve.  He is eating regular food.  His colostomy is working well.  He is out of bed.  He is not complaining of any pain.  Unfortunately, no biopsies were taken of any masses.  The only biopsy that we have is from the colonic stent.   It would have been nice to be able to have had a biopsy of one of the masses that was down in the pelvis in order that we can send off for molecular markers.  His labs show white cell count of 18.  Hemoglobin 9.4.  Platelet count 498,000.  He has had no cough or shortness of breath.  He has had no bleeding.  There has been no leg swelling.  His vital signs show temperature of 98.3.  Pulse 64.  Blood pressure 136/86.  His lungs are clear.  Cardiac exam regular rate and rhythm.  Abdomen is soft.  Colostomy is intact.  Colostomy is working.  There is no abdominal distention.  There is no fluid wave.  There is no palpable liver or spleen tip.  Extremity shows no clubbing, cyanosis or edema.  Neurological exam is nonfocal.  Hopefully, Kevin Villegas will go home tomorrow.  I probably would not initiate any systemic chemotherapy for another couple weeks.  I am glad that he is recovering so nicely from his surgery.  I know the staff up on [address] are doing a fantastic job with him.  Lattie Haw, MD  [place] 5:3-5

## 2021-06-17 NOTE — Progress Notes (Signed)
Patient refused to ambulate during the night shift, but state that he would walk during the day.

## 2021-06-17 NOTE — Progress Notes (Signed)
[facility] Progress Note:   3 Days Post-Op  Subjective: Mental status is very clear.  Complaints just discomfort from the mucous fistula tube. Objective: Vital signs in last 24 hours: Temp:  [98.2 F (36.8 C)-98.3 F (36.8 C)] 98.3 F (36.8 C) (06/30 0625) Pulse Rate:  [64-72] 64 (06/30 0625) Resp:  [16-18] 18 (06/30 0625) BP: (136-153)/(78-90) 136/86 (06/30 0625) SpO2:  [96 %-98 %] 96 % (06/30 0625)  Intake/Output from previous day: 06/29 0701 - 06/30 0700 In: 1137.5 [P.O.:600; I.V.:237.5; IV Piggyback:300] Out: 360 [Drains:10; Stool:350] Intake/Output this shift: No intake/output data recorded.  Physical Exam: Work of breathing is normal.  Ostomy functioning.  Lab Results:  Results for orders placed or performed during the hospital encounter of 06/09/21 (from the past 48 hour(s))  Glucose, capillary     Status: Abnormal   Collection Time: 06/15/21 11:17 AM  Result Value Ref Range   Glucose-Capillary 129 (H) 70 - 99 mg/dL    Comment: Glucose reference range applies only to samples taken after fasting for at least 8 hours.  Glucose, capillary     Status: Abnormal   Collection Time: 06/15/21  4:18 PM  Result Value Ref Range   Glucose-Capillary 114 (H) 70 - 99 mg/dL    Comment: Glucose reference range applies only to samples taken after fasting for at least 8 hours.   Comment 1 Notify RN   Glucose, capillary     Status: Abnormal   Collection Time: 06/16/21 12:07 AM  Result Value Ref Range   Glucose-Capillary 160 (H) 70 - 99 mg/dL    Comment: Glucose reference range applies only to samples taken after fasting for at least 8 hours.  Magnesium     Status: None   Collection Time: 06/16/21  1:20 AM  Result Value Ref Range   Magnesium 1.9 1.7 - 2.4 mg/dL    Comment: Performed at [facility], [place] [address], [place], [place] [address]  Phosphorus     Status: None   Collection Time: 06/16/21  1:20 AM  Result Value Ref Range   Phosphorus 3.1 2.5  - 4.6 mg/dL    Comment: Performed at [facility], [place] [address] [place], [place] [address]  Basic metabolic panel     Status: Abnormal   Collection Time: 06/16/21  1:20 AM  Result Value Ref Range   Sodium 137 135 - 145 mmol/L   Potassium 4.0 3.5 - 5.1 mmol/L   Chloride 108 98 - 111 mmol/L   CO2 24 22 - 32 mmol/L   Glucose, Bld 145 (H) 70 - 99 mg/dL    Comment: Glucose reference range applies only to samples taken after fasting for at least 8 hours.   BUN 19 8 - 23 mg/dL   Creatinine, Ser 1.47 (H) 0.61 - 1.24 mg/dL   Calcium 7.7 (L) 8.9 - 10.3 mg/dL   GFR, Estimated 48 (L) >60 mL/min    Comment: (NOTE) Calculated using the CKD-EPI Creatinine Equation (2021)    Anion gap 5 5 - 15    Comment: Performed at [facility], [place] [address], [place],  [address]  CBC     Status: Abnormal   Collection Time: 06/16/21  1:20 AM  Result Value Ref Range   WBC 25.5 (H) 4.0 - 10.5 K/uL   RBC 3.02 (L) 4.22 - 5.81 MIL/uL   Hemoglobin 9.6 (L) 13.0 - 17.0 g/dL   HCT 29.3 (L) 39.0 - 52.0 %   MCV 97.0  80.0 - 100.0 fL   MCH 31.8 26.0 - 34.0 pg   MCHC 32.8 30.0 - 36.0 g/dL   RDW 15.2 11.5 - 15.5 %   Platelets 456 (H) 150 - 400 K/uL   nRBC 0.0 0.0 - 0.2 %    Comment: Performed at [facility], [place] [address] [place], [place] [address]  Glucose, capillary     Status: Abnormal   Collection Time: 06/16/21  5:50 AM  Result Value Ref Range   Glucose-Capillary 102 (H) 70 - 99 mg/dL    Comment: Glucose reference range applies only to samples taken after fasting for at least 8 hours.  Glucose, capillary     Status: Abnormal   Collection Time: 06/16/21 11:53 AM  Result Value Ref Range   Glucose-Capillary 115 (H) 70 - 99 mg/dL    Comment: Glucose reference range applies only to samples taken after fasting for at least 8 hours.  Glucose, capillary     Status: Abnormal   Collection Time: 06/16/21  5:15 PM  Result Value Ref Range    Glucose-Capillary 120 (H) 70 - 99 mg/dL    Comment: Glucose reference range applies only to samples taken after fasting for at least 8 hours.  CBC     Status: Abnormal   Collection Time: 06/17/21  4:25 AM  Result Value Ref Range   WBC 18.0 (H) 4.0 - 10.5 K/uL   RBC 2.92 (L) 4.22 - 5.81 MIL/uL   Hemoglobin 9.4 (L) 13.0 - 17.0 g/dL   HCT 28.5 (L) 39.0 - 52.0 %   MCV 97.6 80.0 - 100.0 fL   MCH 32.2 26.0 - 34.0 pg   MCHC 33.0 30.0 - 36.0 g/dL   RDW 15.3 11.5 - 15.5 %   Platelets 498 (H) 150 - 400 K/uL   nRBC 0.1 0.0 - 0.2 %    Comment: Performed at [facility], [place] [address] [place], [place] [address]  Basic metabolic panel     Status: Abnormal   Collection Time: 06/17/21  4:25 AM  Result Value Ref Range   Sodium 137 135 - 145 mmol/L   Potassium 4.0 3.5 - 5.1 mmol/L   Chloride 105 98 - 111 mmol/L   CO2 27 22 - 32 mmol/L   Glucose, Bld 100 (H) 70 - 99 mg/dL    Comment: Glucose reference range applies only to samples taken after fasting for at least 8 hours.   BUN 17 8 - 23 mg/dL   Creatinine, Ser 1.54 (H) 0.61 - 1.24 mg/dL   Calcium 8.0 (L) 8.9 - 10.3 mg/dL   GFR, Estimated 46 (L) >60 mL/min    Comment: (NOTE) Calculated using the CKD-EPI Creatinine Equation (2021)    Anion gap 5 5 - 15    Comment: Performed at [facility], [place] [address] [place], [place] [address]  Glucose, capillary     Status: None   Collection Time: 06/17/21  6:21 AM  Result Value Ref Range   Glucose-Capillary 90 70 - 99 mg/dL    Comment: Glucose reference range applies only to samples taken after fasting for at least 8 hours.    Radiology/Results: No results found.  Anti-infectives: Anti-infectives (From admission, onward)    Start     Dose/Rate Route Frequency Ordered Stop   06/15/21 0800  Ampicillin-Sulbactam (UNASYN) 3 g in sodium chloride 0.9 % 100 mL IVPB        3 g 200 mL/hr over 30 Minutes Intravenous Every  6 hours 06/15/21 0753 06/20/21 0759    06/15/21 0200  cefoTEtan (CEFOTAN) 2 g in sodium chloride 0.9 % 100 mL IVPB        2 g 200 mL/hr over 30 Minutes Intravenous Every 12 hours 06/14/21 1847 06/15/21 0305   06/14/21 1145  ceFAZolin (ANCEF) 2-4 GM/100ML-% IVPB  Status:  Discontinued       Note to Pharmacy: Charmayne Sheer   : cabinet override      06/14/21 1145 06/14/21 1152   06/14/21 0600  cefoTEtan (CEFOTAN) 2 g in dextrose 5 % 50 mL IVPB       Note to Pharmacy: Pharmacy may adjust dose strength for optimal dosing.   Send with patient on call to the OR.  Anesthesia to complete antibiotic administration <47min prior to incision per [facility].   2 g 100 mL/hr over 30 Minutes Intravenous On call to O.R. 06/13/21 1335 06/14/21 1350   06/13/21 1400  neomycin (MYCIFRADIN) tablet 1,000 mg  Status:  Discontinued        1,000 mg Oral 3 times per day on Sun 06/13/21 1333 06/14/21 1847   06/13/21 1400  metroNIDAZOLE (FLAGYL) tablet 1,000 mg  Status:  Discontinued       Note to Pharmacy: Take 2 pills (=1000mg ) by mouth at 1pm, 3pm, and 10pm the day before your colorectal operation   1,000 mg Oral 3 times per day on Sun 06/13/21 1335 06/13/21 1404   06/13/21 1332  metroNIDAZOLE (FLAGYL) tablet 500 mg  Status:  Discontinued       Note to Pharmacy: Take 2 pills (=1000mg ) by mouth at 1pm, 3pm, and 10pm the day before your colorectal operation   500 mg Oral As directed 06/13/21 1333 06/13/21 1335   06/12/21 0000  Ampicillin-Sulbactam (UNASYN) 3 g in sodium chloride 0.9 % 100 mL IVPB  Status:  Discontinued        3 g 200 mL/hr over 30 Minutes Intravenous Every 6 hours 06/11/21 1955 06/14/21 1847   06/11/21 1800  metroNIDAZOLE (FLAGYL) IVPB 500 mg  Status:  Discontinued        500 mg 100 mL/hr over 60 Minutes Intravenous Every 8 hours 06/11/21 1636 06/11/21 1949   06/11/21 1700  cefTRIAXone (ROCEPHIN) 2 g in sodium chloride 0.9 % 100 mL IVPB  Status:  Discontinued        2 g 200 mL/hr over 30 Minutes Intravenous Daily 06/11/21 1636  06/11/21 1949       Assessment/Plan: Problem List: Patient Active Problem List   Diagnosis Date Noted   Protein-calorie malnutrition, severe 06/12/2021   Aspiration pneumonitis ([place]) 06/12/2021   Acute hypoxemic respiratory failure ([place]) 06/12/2021   Hydroureter on right 06/10/2021   Aortic atherosclerosis ([place]) 06/10/2021   Renal insufficiency 06/09/2021   Dehydration 06/02/2021   Acute prerenal azotemia 06/02/2021   Leukocytosis 06/02/2021   Right ureteral stone 06/02/2021   Severe malnutrition ([place]) 06/02/2021   Large bowel obstruction ([facility]) secondary to colonic stricture 06/01/2021   Pneumatosis coli    Colon stricture ([place])    Pancreatic mass 06/27/2019   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  with metastatic disease 06/27/2019   Rhinitis medicamentosa 02/26/2019    Plan discharge Friday (tomorrow)  Will cap mucous fistula tube.   3 Days Post-Op    LOS: 8 days   [name] Done, MD, [facility] [phone] to reach the surgeon on call.    06/17/2021 9:44 AM

## 2021-06-17 NOTE — Consult Note (Signed)
[place] Nurse ostomy follow up Pt's wife has taken all the educational materials and 4 sets of barrier rings/wafers/pouches home. Dropped by 3 samples of one piece pouches and information for the outpatient ostomy clinic phone number as the patient had previously requested. He states he emptied the pouch with assistance from the nurses and hopes he will discharge home tomorrow.  He denies further questions at this time and has declined the need for home health according to the case manager.  Julien Girt MSN, RN, [place], [place] [address] [place], [place]

## 2021-06-17 NOTE — Progress Notes (Signed)
PROGRESS NOTE    Kevin Villegas  NIO:[id number] DOB: Sep 03, 1941 DOA: 06/09/2021 PCP: Dettinger, [name], MD     Brief Narrative:  Kevin Villegas is a 80 year old man PMH metastatic pancreatic cancer, recurrent large bowel obstructions, status post colonic stent, presented with abdominal pain.  Admitted for recurrent large bowel obstruction.  Failed to improve with conservative management.  Followed by gastroenterology and general surgery.  Nutrition support routinely.  Underwent operative intervention 6/27.    New events last 24 hours / Subjective: Patient sitting at the side of bed, eating breakfast.  Having output from colostomy.  No complaints other than some sharp pains at the site of fistula tube.  Assessment & Plan:   Principal Problem:   Large bowel obstruction (HCC) secondary to colonic stricture Active Problems:   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)M1  with metastatic disease   Colon stricture (HCC)   Right ureteral stone   Severe malnutrition (HCC)   Renal insufficiency   Hydroureter on right   Aortic atherosclerosis (HCC)   Protein-calorie malnutrition, severe   Aspiration pneumonitis (HCC)   Acute hypoxemic respiratory failure (HCC)   Recurrent large bowel obstruction secondary to colonic stricture -Status post surgery, colotomy, removal of stent, end colostomy -Per general surgery  Malignant neoplasm of tail of pancreas with metastatic disease -Followed by Dr. Jorje Guild through [facility] and Dr. Marin Olp through [facility].  Was started on gemcitabine/Abraxane chemotherapy on 05/05/21, has not received further infusions due to frequent hospitalization  Acute hypoxemic respiratory failure secondary to aspiration -Resolved, on room air this morning -Continue Unasyn, last day of antibiotics today for total 7 day course  -WBC improving  Severe malnutrition -Tolerated regular diet  AKI -Baseline creatinine around  1.3-1.4 -Stable  Hydroureter on right -Per Dr. Junious Silk, can follow-up as outpatient, no stent for now  Prediabetes -Hemoglobin A1c 6.1   DVT prophylaxis:  heparin injection 5,000 Units Start: 06/14/21 2200 SCD's Start: 06/14/21 1848  Code Status:     Code Status Orders  (From admission, onward)           Start     Ordered   06/09/21 2200  Full code  Continuous        06/09/21 2200           Code Status History     Date Active Date Inactive Code Status Order ID Comments User Context   06/01/2021 2102 06/05/2021 1830 Full Code [id number]  Rhetta Mura, DO ED   05/18/2021 1200 05/26/2021 1958 Partial Code [id number]  Jonnie Finner, DO Inpatient   12/12/2014 1730 12/17/2014 1704 Full Code [id number]  Velvet Bathe, MD Inpatient      Advance Directive Documentation    Flowsheet Row Most Recent Value  Type of Advance Directive Healthcare Power of Attorney, Living will  Pre-existing out of facility DNR order (yellow form or pink MOST form) --  "MOST" Form in Place? --      Family Communication: No family at bedside Disposition Plan:  Status is: Inpatient  Remains inpatient appropriate because:Inpatient level of care appropriate due to severity of illness  Dispo: The patient is from: Home              Anticipated d/c is to: Home              Patient currently is not medically stable to d/c.   Difficult to place patient No      Antimicrobials:  Anti-infectives (From admission, onward)  Start     Dose/Rate Route Frequency Ordered Stop   06/15/21 0800  Ampicillin-Sulbactam (UNASYN) 3 g in sodium chloride 0.9 % 100 mL IVPB        3 g 200 mL/hr over 30 Minutes Intravenous Every 6 hours 06/15/21 0753 06/20/21 0759   06/15/21 0200  cefoTEtan (CEFOTAN) 2 g in sodium chloride 0.9 % 100 mL IVPB        2 g 200 mL/hr over 30 Minutes Intravenous Every 12 hours 06/14/21 1847 06/15/21 0305   06/14/21 1145  ceFAZolin (ANCEF) 2-4 GM/100ML-% IVPB  Status:   Discontinued       Note to Pharmacy: Charmayne Sheer   : cabinet override      06/14/21 1145 06/14/21 1152   06/14/21 0600  cefoTEtan (CEFOTAN) 2 g in dextrose 5 % 50 mL IVPB       Note to Pharmacy: Pharmacy may adjust dose strength for optimal dosing.   Send with patient on call to the OR.  Anesthesia to complete antibiotic administration <85min prior to incision per [facility].   2 g 100 mL/hr over 30 Minutes Intravenous On call to O.R. 06/13/21 1335 06/14/21 1350   06/13/21 1400  neomycin (MYCIFRADIN) tablet 1,000 mg  Status:  Discontinued        1,000 mg Oral 3 times per day on Sun 06/13/21 1333 06/14/21 1847   06/13/21 1400  metroNIDAZOLE (FLAGYL) tablet 1,000 mg  Status:  Discontinued       Note to Pharmacy: Take 2 pills (=1000mg ) by mouth at 1pm, 3pm, and 10pm the day before your colorectal operation   1,000 mg Oral 3 times per day on Sun 06/13/21 1335 06/13/21 1404   06/13/21 1332  metroNIDAZOLE (FLAGYL) tablet 500 mg  Status:  Discontinued       Note to Pharmacy: Take 2 pills (=1000mg ) by mouth at 1pm, 3pm, and 10pm the day before your colorectal operation   500 mg Oral As directed 06/13/21 1333 06/13/21 1335   06/12/21 0000  Ampicillin-Sulbactam (UNASYN) 3 g in sodium chloride 0.9 % 100 mL IVPB  Status:  Discontinued        3 g 200 mL/hr over 30 Minutes Intravenous Every 6 hours 06/11/21 1955 06/14/21 1847   06/11/21 1800  metroNIDAZOLE (FLAGYL) IVPB 500 mg  Status:  Discontinued        500 mg 100 mL/hr over 60 Minutes Intravenous Every 8 hours 06/11/21 1636 06/11/21 1949   06/11/21 1700  cefTRIAXone (ROCEPHIN) 2 g in sodium chloride 0.9 % 100 mL IVPB  Status:  Discontinued        2 g 200 mL/hr over 30 Minutes Intravenous Daily 06/11/21 1636 06/11/21 1949        Objective: Vitals:   06/16/21 0552 06/16/21 1336 06/16/21 2147 06/17/21 0625  BP: (!) 144/81 (!) 145/90 (!) 153/78 136/86  Pulse: 66 72 65 64  Resp: 12 18 16 18   Temp: 98.4 F (36.9 C) 98.3 F (36.8 C) 98.2  F (36.8 C) 98.3 F (36.8 C)  TempSrc:  Oral Oral Oral  SpO2: 96% 98% 98% 96%  Weight:      Height:        Intake/Output Summary (Last 24 hours) at 06/17/2021 1206 Last data filed at 06/17/2021 1032 Gross per 24 hour  Intake 1080 ml  Output 185 ml  Net 895 ml    Filed Weights   06/12/21 0500 06/13/21 0500 06/14/21 0500  Weight: 70.5 kg 67.2 kg 67 kg  Examination: General exam: Appears calm and comfortable  Respiratory system: Clear to auscultation. Respiratory effort normal.  On room air Cardiovascular system: S1 & S2 heard, RRR. No pedal edema. Gastrointestinal system: Abdomen is nondistended, soft and nontender. Normal bowel sounds heard.  Colostomy and mucous fistula drain in place Central nervous system: Alert and oriented. Non focal exam. Speech clear  Extremities: Symmetric in appearance bilaterally  Skin: No rashes, lesions or ulcers on exposed skin  Psychiatry: Judgement and insight appear stable. Mood & affect appropriate.    Data Reviewed: I have personally reviewed following labs and imaging studies  CBC: Recent Labs  Lab 06/12/21 0246 06/14/21 0449 06/14/21 2138 06/16/21 0120 06/17/21 0425  WBC 34.8* 20.3* 39.9* 25.5* 18.0*  NEUTROABS  --  16.2*  --   --   --   HGB 11.3* 11.3* 11.5* 9.6* 9.4*  HCT 34.8* 35.1* 35.1* 29.3* 28.5*  MCV 99.1 98.6 97.5 97.0 97.6  PLT 434* 418* 486* 456* 498*    Basic Metabolic Panel: Recent Labs  Lab 06/12/21 0246 06/13/21 0504 06/14/21 0449 06/14/21 2138 06/15/21 0850 06/16/21 0120 06/17/21 0425  NA 137 140 139  --  136 137 137  K 3.8 3.0* 3.9  --  4.5 4.0 4.0  CL 105 106 110  --  107 108 105  CO2 22 26 25   --  23 24 27   GLUCOSE 118* 121* 135*  --  121* 145* 100*  BUN 32* 24* 20  --  20 19 17   CREATININE 1.61* 1.31* 1.19 1.33* 1.46* 1.47* 1.54*  CALCIUM 8.1* 8.4* 8.1*  --  7.9* 7.7* 8.0*  MG 1.7 2.1 1.7  --  1.8 1.9  --   PHOS 3.2 2.7 2.9  --  3.6 3.1  --     GFR: Estimated Creatinine Clearance: 36.9  mL/min (A) (by C-G formula based on SCr of 1.54 mg/dL (H)). Liver Function Tests: Recent Labs  Lab 06/11/21 1907 06/12/21 0246 06/13/21 0504 06/14/21 0449  AST 27 24 18 16   ALT 23 23 15 14   ALKPHOS 69 65 64 56  BILITOT 0.4 0.6 0.4 0.4  PROT 5.5* 5.3* 5.7* 5.1*  ALBUMIN 2.5* 2.7* 2.6* 2.2*    No results for input(s): LIPASE, AMYLASE in the last 168 hours.  No results for input(s): AMMONIA in the last 168 hours. Coagulation Profile: Recent Labs  Lab 06/11/21 1907  INR 1.3*    Cardiac Enzymes: No results for input(s): CKTOTAL, CKMB, CKMBINDEX, TROPONINI in the last 168 hours. BNP (last 3 results) No results for input(s): PROBNP in the last 8760 hours. HbA1C: Recent Labs    06/14/21 2138  HGBA1C 6.1*    CBG: Recent Labs  Lab 06/16/21 0007 06/16/21 0550 06/16/21 1153 06/16/21 1715 06/17/21 0621  GLUCAP 160* 102* 115* 120* 90    Lipid Profile: No results for input(s): CHOL, HDL, LDLCALC, TRIG, CHOLHDL, LDLDIRECT in the last 72 hours.  Thyroid Function Tests: No results for input(s): TSH, T4TOTAL, FREET4, T3FREE, THYROIDAB in the last 72 hours. Anemia Panel: No results for input(s): VITAMINB12, FOLATE, FERRITIN, TIBC, IRON, RETICCTPCT in the last 72 hours. Sepsis Labs: Recent Labs  Lab 06/11/21 1856 06/11/21 1907 06/11/21 2233 06/12/21 0246  PROCALCITON  --  3.25  --   --   LATICACIDVEN 3.1*  --  2.5* 2.4*     Recent Results (from the past 240 hour(s))  Resp Panel by RT-PCR (Flu A&B, Covid) Nasopharyngeal Swab     Status: None   Collection Time:  06/09/21  1:41 PM   Specimen: Nasopharyngeal Swab; Nasopharyngeal(NP) swabs in vial transport medium  Result Value Ref Range Status   SARS Coronavirus 2 by RT PCR NEGATIVE NEGATIVE Final    Comment: (NOTE) SARS-CoV-2 target nucleic acids are NOT DETECTED.  The SARS-CoV-2 RNA is generally detectable in upper respiratory specimens during the acute phase of infection. The lowest concentration of SARS-CoV-2  viral copies this assay can detect is 138 copies/mL. A negative result does not preclude SARS-Cov-2 infection and should not be used as the sole basis for treatment or other patient management decisions. A negative result may occur with  improper specimen collection/handling, submission of specimen other than nasopharyngeal swab, presence of viral mutation(s) within the areas targeted by this assay, and inadequate number of viral copies(<138 copies/mL). A negative result must be combined with clinical observations, patient history, and epidemiological information. The expected result is Negative.  Fact Sheet for Patients:  EntrepreneurPulse.com.au  Fact Sheet for Healthcare Providers:  IncredibleEmployment.be  This test is no t yet approved or cleared by the [place] FDA and  has been authorized for detection and/or diagnosis of SARS-CoV-2 by FDA under an Emergency Use Authorization (EUA). This EUA will remain  in effect (meaning this test can be used) for the duration of the COVID-19 declaration under Section 564(b)(1) of the Act, 21 U.S.C.section 360bbb-3(b)(1), unless the authorization is terminated  or revoked sooner.       Influenza A by PCR NEGATIVE NEGATIVE Final   Influenza B by PCR NEGATIVE NEGATIVE Final    Comment: (NOTE) The Xpert Xpress SARS-CoV-2/FLU/RSV plus assay is intended as an aid in the diagnosis of influenza from Nasopharyngeal swab specimens and should not be used as a sole basis for treatment. Nasal washings and aspirates are unacceptable for Xpert Xpress SARS-CoV-2/FLU/RSV testing.  Fact Sheet for Patients: EntrepreneurPulse.com.au  Fact Sheet for Healthcare Providers: IncredibleEmployment.be  This test is not yet approved or cleared by the [place] FDA and has been authorized for detection and/or diagnosis of SARS-CoV-2 by FDA under an Emergency Use Authorization  (EUA). This EUA will remain in effect (meaning this test can be used) for the duration of the COVID-19 declaration under Section 564(b)(1) of the Act, 21 U.S.C. section 360bbb-3(b)(1), unless the authorization is terminated or revoked.  Performed at [facility], [address] [place], [place] [address]   Culture, blood (x 2)     Status: None   Collection Time: 06/11/21  5:24 PM   Specimen: BLOOD  Result Value Ref Range Status   Specimen Description   Final    BLOOD LEFT ANTECUBITAL UPPER Performed at [place] [address], [place], [place] [address]    Special Requests   Final    Blood Culture adequate volume BOTTLES DRAWN AEROBIC AND ANAEROBIC   Culture   Final    NO GROWTH 5 DAYS Performed at [facility], [place] [address] [place], [place] [address]    Report Status 06/16/2021 FINAL  Final  Culture, blood (x 2)     Status: None   Collection Time: 06/11/21  5:25 PM   Specimen: BLOOD  Result Value Ref Range Status   Specimen Description   Final    BLOOD LEFT ANTECUBITAL LOWER Performed at [place] [address] [place], [place] [address]    Special Requests   Final    BOTTLES DRAWN AEROBIC AND ANAEROBIC Blood Culture adequate volume Performed at [facility], [place] [address], [place], [place] [address]    Culture   Final    NO GROWTH 5 DAYS Performed at [facility], [place] [address], [place], [place] [address]    Report Status 06/16/2021 FINAL  Final       Radiology Studies: No results found.    Scheduled Meds:  Chlorhexidine Gluconate Cloth  6 each Topical Daily   feeding supplement  1 Container Oral TID BM   feeding supplement  237 mL Oral BID BM   heparin injection (subcutaneous)  5,000 Units Subcutaneous Q8H   insulin aspart  0-20 Units Subcutaneous TID WC   pantoprazole  40 mg Oral QHS   Continuous Infusions:  ampicillin-sulbactam (UNASYN) IV 3 g (06/17/21  0816)     LOS: 8 days      Time spent: 25 minutes   Dessa Phi, DO [facility] 06/17/2021, 12:06 PM   Available via Epic secure chat 7am-7pm After these hours, please refer to coverage provider listed on [email or web address]

## 2021-06-17 NOTE — Plan of Care (Signed)
  Problem: Activity: Goal: Risk for activity intolerance will decrease Outcome: Progressing   Problem: Coping: Goal: Level of anxiety will decrease Outcome: Progressing   Problem: Pain Managment: Goal: General experience of comfort will improve Outcome: Progressing   Problem: Safety: Goal: Ability to remain free from injury will improve Outcome: Progressing   Problem: Skin Integrity: Goal: Risk for impaired skin integrity will decrease Outcome: Progressing   

## 2021-06-18 ENCOUNTER — Encounter: Payer: Self-pay | Admitting: *Deleted

## 2021-06-18 LAB — BASIC METABOLIC PANEL
Anion gap: 6 (ref 5–15)
BUN: 17 mg/dL (ref 8–23)
CO2: 26 mmol/L (ref 22–32)
Calcium: 8.2 mg/dL — ABNORMAL LOW (ref 8.9–10.3)
Chloride: 105 mmol/L (ref 98–111)
Creatinine, Ser: 1.54 mg/dL — ABNORMAL HIGH (ref 0.61–1.24)
GFR, Estimated: 46 mL/min — ABNORMAL LOW (ref >60)
Glucose, Bld: 94 mg/dL (ref 70–99)
Potassium: 3.8 mmol/L (ref 3.5–5.1)
Sodium: 137 mmol/L (ref 135–145)

## 2021-06-18 LAB — CBC
HCT: 29.2 % — ABNORMAL LOW (ref 39.0–52.0)
Hemoglobin: 9.3 g/dL — ABNORMAL LOW (ref 13.0–17.0)
MCH: 31.2 pg (ref 26.0–34.0)
MCHC: 31.8 g/dL (ref 30.0–36.0)
MCV: 98 fL (ref 80.0–100.0)
Platelets: 552 10*3/uL — ABNORMAL HIGH (ref 150–400)
RBC: 2.98 MIL/uL — ABNORMAL LOW (ref 4.22–5.81)
RDW: 15.2 % (ref 11.5–15.5)
WBC: 16.1 10*3/uL — ABNORMAL HIGH (ref 4.0–10.5)
nRBC: 0.1 % (ref 0.0–0.2)

## 2021-06-18 MED ORDER — OXYCODONE HCL 5 MG PO TABS: 5.0000 mg | ORAL_TABLET | Freq: Four times a day (QID) | ORAL | 0 refills | Status: DC | PRN

## 2021-06-18 MED ORDER — HEPARIN SOD (PORK) LOCK FLUSH 100 UNIT/ML IV SOLN
500.0000 [IU] | INTRAVENOUS | Status: AC | PRN
  Administered 2021-06-18: 500 [IU]

## 2021-06-18 NOTE — Progress Notes (Signed)
4 Days Post-Op  Subjective: CC: Doing well. Only pain is around mucous fistula. Well controlled on oral medications. Tolerating diet without n/v. Having colostomy output. Mobilizing. Voiding. Excited for d/c.   Objective: Vital signs in last 24 hours: Temp:  [98.1 F (36.7 C)-98.5 F (36.9 C)] 98.1 F (36.7 C) (07/01 0525) Pulse Rate:  [66-70] 66 (07/01 0525) Resp:  [16-20] 16 (07/01 0525) BP: (135-164)/(85-86) 149/86 (07/01 0525) SpO2:  [97 %-98 %] 97 % (07/01 0525) Last BM Date: 06/18/21  Intake/Output from previous day: 06/30 0701 - 07/01 0700 In: 1100 [P.O.:900; IV Piggyback:200] Out: 125 [Stool:125] Intake/Output this shift: No intake/output data recorded.  PE: Gen:  Alert, NAD, pleasant HEENT: EOM's intact, pupils equal and round Card:  RRR Pulm:  CTAB, no W/R/R, effort normal Abd: Soft, ND, appropriately tender, mucous fistula in place with 54fr Foley with some bloody output. No pain with palpation around tube. No skin changes around tube. Colostomy in place with red viable stoma.  Liquid stool, along with air in bag.  Lower midline and laparoscopic incisions c/d/I w/ staples in place. +BS Ext:  No LE edema Psych: A&Ox3 Skin: no rashes noted, warm and dry  Lab Results:  Recent Labs    06/17/21 0425 06/18/21 0409  WBC 18.0* 16.1*  HGB 9.4* 9.3*  HCT 28.5* 29.2*  PLT 498* 552*   BMET Recent Labs    06/17/21 0425 06/18/21 0409  NA 137 137  K 4.0 3.8  CL 105 105  CO2 27 26  GLUCOSE 100* 94  BUN 17 17  CREATININE 1.54* 1.54*  CALCIUM 8.0* 8.2*   PT/INR No results for input(s): LABPROT, INR in the last 72 hours. CMP     Component Value Date/Time   NA 137 06/18/2021 0409   NA 141 03/22/2017 1101   K 3.8 06/18/2021 0409   CL 105 06/18/2021 0409   CO2 26 06/18/2021 0409   GLUCOSE 94 06/18/2021 0409   BUN 17 06/18/2021 0409   BUN 17 03/22/2017 1101   CREATININE 1.54 (H) 06/18/2021 0409   CREATININE 1.71 (H) 04/28/2021 1032   CALCIUM 8.2 (L)  06/18/2021 0409   PROT 5.1 (L) 06/14/2021 0449   PROT 6.9 03/22/2017 1101   ALBUMIN 2.2 (L) 06/14/2021 0449   ALBUMIN 4.5 03/22/2017 1101   AST 16 06/14/2021 0449   AST 27 04/28/2021 1032   ALT 14 06/14/2021 0449   ALT 18 04/28/2021 1032   ALKPHOS 56 06/14/2021 0449   BILITOT 0.4 06/14/2021 0449   BILITOT 0.5 04/28/2021 1032   GFRNONAA 46 (L) 06/18/2021 0409   GFRNONAA 40 (L) 04/28/2021 1032   GFRAA >60 11/24/2019 1600   Lipase     Component Value Date/Time   LIPASE 33 06/09/2021 1223       Studies/Results: No results found.  Anti-infectives: Anti-infectives (From admission, onward)    Start     Dose/Rate Route Frequency Ordered Stop   06/15/21 0800  Ampicillin-Sulbactam (UNASYN) 3 g in sodium chloride 0.9 % 100 mL IVPB        3 g 200 mL/hr over 30 Minutes Intravenous Every 6 hours 06/15/21 0753 06/17/21 2225   06/15/21 0200  cefoTEtan (CEFOTAN) 2 g in sodium chloride 0.9 % 100 mL IVPB        2 g 200 mL/hr over 30 Minutes Intravenous Every 12 hours 06/14/21 1847 06/15/21 0305   06/14/21 1145  ceFAZolin (ANCEF) 2-4 GM/100ML-% IVPB  Status:  Discontinued  Note to Pharmacy: Charmayne Sheer   : cabinet override      06/14/21 1145 06/14/21 1152   06/14/21 0600  cefoTEtan (CEFOTAN) 2 g in dextrose 5 % 50 mL IVPB       Note to Pharmacy: Pharmacy may adjust dose strength for optimal dosing.   Send with patient on call to the OR.  Anesthesia to complete antibiotic administration <13min prior to incision per [facility].   2 g 100 mL/hr over 30 Minutes Intravenous On call to O.R. 06/13/21 1335 06/14/21 1350   06/13/21 1400  neomycin (MYCIFRADIN) tablet 1,000 mg  Status:  Discontinued        1,000 mg Oral 3 times per day on Sun 06/13/21 1333 06/14/21 1847   06/13/21 1400  metroNIDAZOLE (FLAGYL) tablet 1,000 mg  Status:  Discontinued       Note to Pharmacy: Take 2 pills (=1000mg ) by mouth at 1pm, 3pm, and 10pm the day before your colorectal operation   1,000 mg Oral 3  times per day on Sun 06/13/21 1335 06/13/21 1404   06/13/21 1332  metroNIDAZOLE (FLAGYL) tablet 500 mg  Status:  Discontinued       Note to Pharmacy: Take 2 pills (=1000mg ) by mouth at 1pm, 3pm, and 10pm the day before your colorectal operation   500 mg Oral As directed 06/13/21 1333 06/13/21 1335   06/12/21 0000  Ampicillin-Sulbactam (UNASYN) 3 g in sodium chloride 0.9 % 100 mL IVPB  Status:  Discontinued        3 g 200 mL/hr over 30 Minutes Intravenous Every 6 hours 06/11/21 1955 06/14/21 1847   06/11/21 1800  metroNIDAZOLE (FLAGYL) IVPB 500 mg  Status:  Discontinued        500 mg 100 mL/hr over 60 Minutes Intravenous Every 8 hours 06/11/21 1636 06/11/21 1949   06/11/21 1700  cefTRIAXone (ROCEPHIN) 2 g in sodium chloride 0.9 % 100 mL IVPB  Status:  Discontinued        2 g 200 mL/hr over 30 Minutes Intravenous Daily 06/11/21 1636 06/11/21 1949        Assessment/Plan POD 4 s/p Laparoscopic mobilization of colon and attempted mobilization of the pelvis; colotomy and removal of metal stent sending it to path for exam of tissue, 24 foley venting mucous fistula; end colostomy for Distal sigmoid colon obstruction with metal stent in place - Dr. Hassell Done - 06/14/21 - Tolerating regular diet had return of bowel function - WOCN following for new ostomy. Patient feels comfortable taking care of this at home. Declined HH.  - Mucus fistula in place - okay to cap. - Mobilize, PT signed off - Pulm toilet - Path negative as below - Okay for d/c from our standpoint. Okay to cap mucous fistula tube. Arranged follow up for staple removal and with Dr. Hassell Done in 2 weeks. Will send pain medication to his pharmacy.  Path COLON STENT, SIGMOID, BIOPSY:  - Metallic stent, 63.8 cm in length, gross examination only.  - Colonic mucosa with hyperplastic changes and inflammatory exudate.   FEN - Reg VTE - SCDs, heparin subq ID - Cefotetan periop.  Unasyn completed for aspiration pneumonia per [facility]. None currently   Foley - None, voiding Follow-Up - Rn visit for staple removal. Dr. Hassell Done   Hx of duodenal stricture for which he underwent stent placement 11/2019 Hx metastatic pancreatic cancer  - S/p distal pancreatectomy, splenectomy, and partial gastrectomy in 10/2017 at [place] - Prior CT scan shows progressive metastatic disease with new/enlarging pulmonary  nodules and enlarging mesenteric soft tissue mass at the duodenal jejunal junction  - Dr. Marin Olp following.    LOS: 9 days    Jillyn Ledger , [facility] 06/18/2021, 10:14 AM Please see Amion for pager number during day hours 7:00am-4:30pm

## 2021-06-18 NOTE — Discharge Summary (Signed)
Physician Discharge Summary  Kevin Villegas JGO:[id number] DOB: 28-Dec-1940 DOA: 06/09/2021  PCP: Worthy Rancher, MD  Admit date: 06/09/2021 Discharge date: 06/18/2021  Admitted From: Home Disposition:  Home   Recommendations for Outpatient Follow-up:  Follow up with PCP in 1 week Follow up with general surgery, Dr. Hassell Done in 2 weeks.  Arranged follow-up for staple removal.   Follow-up with Dr. Junious Silk, urology Follow-up with Dr. Marin Olp, oncology Follow-up with outpatient CBC to ensure resolution of leukocytosis, improving at time of discharge.  Discharge Condition: Stable CODE STATUS: Full code Diet recommendation: Regular diet  Brief/Interim Summary: Kevin Villegas is a 80 year old man PMH metastatic pancreatic cancer, recurrent large bowel obstructions, status post colonic stent, presented with abdominal pain.  Admitted for recurrent large bowel obstruction.  Failed to improve with conservative management.  Followed by gastroenterology and general surgery.  Nutrition support routinely.  He ultimately underwent colotomy, removal of stent, end colostomy by Dr. Hassell Done.  His diet was advanced, colostomy working well.  Discharge Diagnoses:  Principal Problem:   Large bowel obstruction (HCC) secondary to colonic stricture Active Problems:   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  with metastatic disease   Colon stricture (HCC)   Right ureteral stone   Severe malnutrition (HCC)   Renal insufficiency   Hydroureter on right   Aortic atherosclerosis (HCC)   Protein-calorie malnutrition, severe   Aspiration pneumonitis (HCC)   Acute hypoxemic respiratory failure (HCC)  Recurrent large bowel obstruction secondary to colonic stricture -Status post surgery, colotomy, removal of stent, end colostomy 06/14/2021 -Per general surgery -Follow-up outpatient   Malignant neoplasm of tail of pancreas with metastatic disease -Followed by Dr. Jorje Guild through [facility]  and Dr. Marin Olp through [facility]  Was started on gemcitabine/Abraxane chemotherapy on 05/05/21, has not received further infusions due to frequent hospitalization   Acute hypoxemic respiratory failure secondary to aspiration -Resolved, on room air this morning -Completed 7-day course of antibiotics -WBC improving  Severe malnutrition -Tolerated regular diet  AKI -Baseline creatinine around 1.3-1.4 -Stable  Hydroureter on right -Per Dr. Junious Silk, can follow-up as outpatient, no stent for now   Prediabetes -Hemoglobin A1c 6.1    Discharge Instructions  Discharge Instructions     Amb Referral to Ostomy Clinic   Complete by: As directed    Reason for referral modifiers: Pre and post-operative counseling for ostomy management   Call MD for:  difficulty breathing, headache or visual disturbances   Complete by: As directed    Call MD for:  extreme fatigue   Complete by: As directed    Call MD for:  persistant dizziness or light-headedness   Complete by: As directed    Call MD for:  persistant nausea and vomiting   Complete by: As directed    Call MD for:  redness, tenderness, or signs of infection (pain, swelling, redness, odor or green/yellow discharge around incision site)   Complete by: As directed    Call MD for:  severe uncontrolled pain   Complete by: As directed    Call MD for:  temperature >100.4   Complete by: As directed    Discharge instructions   Complete by: As directed    You were cared for by a hospitalist during your hospital stay. If you have any questions about your discharge medications or the care you received while you were in the hospital after you are discharged, you can call the unit and ask to speak with the hospitalist on call if the hospitalist  that took care of you is not available. Once you are discharged, your primary care physician will handle any further medical issues. Please note that NO REFILLS for any discharge medications will be  authorized once you are discharged, as it is imperative that you return to your primary care physician (or establish a relationship with a primary care physician if you do not have one) for your aftercare needs so that they can reassess your need for medications and monitor your lab values.   Increase activity slowly   Complete by: As directed    Leave dressing on - Keep it clean, dry, and intact until clinic visit   Complete by: As directed       Allergies as of 06/18/2021       Reactions   Pollen Extract Other (See Comments)   Sinus congestion        Medication List     STOP taking these medications    multivitamin with minerals Tabs tablet   polyethylene glycol 17 g packet Commonly known as: MiraLax   psyllium 95 % Pack Commonly known as: HYDROCIL/METAMUCIL       TAKE these medications    oxyCODONE 5 MG immediate release tablet Commonly known as: Oxy IR/ROXICODONE Take 1 tablet (5 mg total) by mouth every 6 (six) hours as needed for breakthrough pain.               Discharge Care Instructions  (From admission, onward)           Start     Ordered   06/18/21 0000  Leave dressing on - Keep it clean, dry, and intact until clinic visit        06/18/21 1049            Follow-up Information     Johnathan Hausen, MD Follow up on 07/02/2021.   Specialty: General Surgery Why: @315pm  for follow up with Dr. Hassell Done.  Please arrive 30 minutes prior to your appointment for paperwork. Please bring a copy of your photo ID and insurance card. Contact information: [address] [place] [address] [phone]         Sur[facility], [place] Follow up on 06/24/2021.   Specialty: General Surgery Why: 930am for staple removal. This will be a nurse visit. Please arrive 30 minutes prior to your appointment for paperwork. Please bring a copy of your photo ID and insurance card. Contact information: [place]  [phone]         Dettinger, [name], MD. Schedule an appointment as soon as possible for a visit in 1 week(s).   Specialties: Family Medicine, Cardiology Contact information: [place] [address] [phone]         Festus Aloe, MD Follow up.   Specialty: Urology Contact information: [place] [address] [phone]         Volanda Napoleon, MD. Schedule an appointment as soon as possible for a visit in 1 week(s).   Specialty: Oncology Contact information: [address] [place] [address] [phone]                Allergies  Allergen Reactions   Pollen Extract Other (See Comments)    Sinus congestion     Procedures/Studies: CT Abdomen Pelvis Wo Contrast  Result Date: 06/09/2021 CLINICAL DATA:  80 year old male with history of acute onset of nonlocalized abdominal pain.  History of pancreatic cancer. EXAM: CT ABDOMEN AND PELVIS WITHOUT CONTRAST TECHNIQUE: Multidetector CT imaging of the abdomen and pelvis was performed following the standard protocol without IV contrast. COMPARISON:  CT the abdomen pelvis 06/01/2021. FINDINGS: Lower chest: Severe calcifications of the aortic valve. Numerous pulmonary nodules noted throughout the visualize lung bases, similar to the recent prior study, measuring up to 1.7 x 1.5 cm in the right lower lobe (axial image 2 of series 4) and 1.7 x 1.9 cm in the inferior aspect of the lingula (axial image 1 of series 4). Several of these pulmonary nodules demonstrate internal cavitation. Hepatobiliary: Multiple well-defined low-attenuation lesions are again noted in the liver, incompletely characterized on today's non-contrast CT examination, but similar to the prior study and statistically likely to represent cysts, measuring up to 1.9 x 1.6 cm between segments 5 and 8 (axial image 18 of series 2). Ill-defined low-attenuation adjacent to the falciform ligament, similar  to prior studies, favored to represent focal fatty infiltration. Status post cholecystectomy. Pancreas: Status post distal pancreatectomy. Residual head and uncinate process of the pancreas are grossly unremarkable in appearance on today's noncontrast CT examination. Spleen: Status post splenectomy. Adrenals/Urinary Tract: Elongated calculus in the distal third of the right ureter (sagittal image 51 of series 6 and axial image 72 of series 2) measuring 2.4 x 0.4 x 0.6 cm. This is associated with moderate proximal right hydroureteronephrosis. No additional calculi are noted within the collecting system of either kidney, along the course of the left ureter or within the lumen of the urinary bladder. No left hydroureteronephrosis. In the upper pole of the right kidney there is a 3.0 cm low-attenuation lesion previously characterized as a simple cyst. Unenhanced appearance of the left kidney is unremarkable. Bilateral adrenal glands are normal in appearance. Urinary bladder is normal in appearance. Stomach/Bowel: Unenhanced appearance of the stomach is normal. There is a stent present extending from the distal duodenum into the proximal jejunum. Previously noted ill-defined soft tissue attenuation mass adjacent to the stent appears larger than the prior examination, currently measuring approximately 4.6 x 3.7 cm (axial image 28 of series 2). There is also a stent in the distal sigmoid colon, with some subtle surrounding inflammatory changes adjacent to the proximal aspect of the stent (best appreciated on axial image 64 of series 2). Notably, the proximal end of the stent appears to be impacted upon the lateral aspect of the sigmoid colon wall, best appreciated on axial image 66 of series 2. Proximal to the colonic stent there is dilatation of both the colon and small bowel with multiple air-fluid levels. Small bowel dilatation measures up to 3.7 cm in diameter in the jejunum and up to 4.2 cm in diameter in the ileum  indicative of obstruction. There is a paucity of stool in the distal sigmoid colon and rectum beyond the stent. Appendix is not confidently identified. Vascular/Lymphatic: Aortic atherosclerosis. No lymphadenopathy noted in the abdomen or pelvis. Reproductive: Prostate gland and seminal vesicles are unremarkable in appearance. Other: Trace volume of ascites.  No pneumoperitoneum. Musculoskeletal: There are no aggressive appearing lytic or blastic lesions noted in the visualized portions of the skeleton. IMPRESSION: 1. Persistent bowel obstruction likely at the level of the proximal aspect of the stent in the sigmoid colon where there are some surrounding inflammatory changes. 2. Interval enlargement of a soft tissue attenuation mass adjacent to the duodenal/jejunal stent, concerning for neoplasm. 3. Widespread metastatic disease to the visualized lung bases, similar to the prior study. 4. Elongated  calculus in the distal third of the right ureter with moderate proximal right hydroureteronephrosis, similar to the prior study. 5. There are calcifications of the aortic valve. Echocardiographic correlation for evaluation of potential valvular dysfunction may be warranted if clinically indicated. 6. Aortic atherosclerosis. 7. Additional incidental findings, as above. Electronically Signed   By: Vinnie Langton M.D.   On: 06/09/2021 13:09   CT Abdomen Pelvis Wo Contrast  Result Date: 06/01/2021 CLINICAL DATA:  Abdominal distension EXAM: CT ABDOMEN AND PELVIS WITHOUT CONTRAST TECHNIQUE: Multidetector CT imaging of the abdomen and pelvis was performed following the standard protocol without IV contrast. COMPARISON:  05/18/2021 FINDINGS: LOWER CHEST: Multiple nodular and cavitary lesions of the lung bases, unchanged. Unchanged size of right lower lobe index nodule (6:4) measuring 15 x 15 mm. HEPATOBILIARY: Normal hepatic contours. No intra- or extrahepatic biliary dilatation. Unchanged hepatic cysts. Status post  cholecystectomy. PANCREAS: Postsurgical changes. SPLEEN: Surgically absent. ADRENALS/URINARY TRACT: The adrenal glands are normal. Moderate right hydroureteronephrosis is unchanged, due to unchanged 8 mm distal right ureteral calculus. No left hydronephrosis. The urinary bladder is normal for degree of distention STOMACH/BOWEL: There are stents in the sigmoid colon and distal duodenum. There is diffuse colonic and small bowel dilatation proximal to the sigmoid colon stent. Previously present pneumatosis coli has resolved. Previously described mass adjacent to the distal duodenum is poorly characterized in the absence of intravenous contrast. However, it appears to be unchanged, measuring 2.9 x 2.5 cm. VASCULAR/LYMPHATIC: There is calcific atherosclerosis of the abdominal aorta. No lymphadenopathy. REPRODUCTIVE: Enlarged prostate measures 5.6 cm in transverse dimension. MUSCULOSKELETAL. Multilevel degenerative disc disease and facet arthrosis. No bony spinal canal stenosis. OTHER: None. IMPRESSION: 1. Diffuse colonic and small bowel dilatation proximal to the sigmoid colon stent, which is likely obstructed. Previously present pneumatosis coli has resolved. 2. Unchanged appearance of 8 mm distal right ureteral calculus with moderate right hydroureteronephrosis. 3. Unchanged appearance of multiple nodular and cavitary metastases of the lung bases. 4. Unchanged appearance of mass adjacent to the distal duodenum. Aortic Atherosclerosis (ICD10-I70.0). Electronically Signed   By: Ulyses Jarred M.D.   On: 06/01/2021 19:44   DG Abd 1 View  Result Date: 06/05/2021 CLINICAL DATA:  Colonic obstruction. History of pancreatic cancer. Recent stent placement. EXAM: ABDOMEN - 1 VIEW COMPARISON:  Abdominal radiograph 06/04/2021. FINDINGS: Stent projects over the left upper quadrant. Nonobstructed bowel gas pattern. Cholecystectomy clips. Additional stent projects over the pelvis. No definite free intraperitoneal air. IMPRESSION:  Similar-appearing bowel-gas pattern. Electronically Signed   By: Lovey Newcomer M.D.   On: 06/05/2021 09:43   DG Abd 1 View  Result Date: 06/04/2021 CLINICAL DATA:  80 year old male with colonic obstruction. History of pancreatic cancer status post distal pancreatectomy, distal duodenal stent, sigmoid colon stent. EXAM: ABDOMEN - 1 VIEW COMPARISON:  KUB 06/03/2021. CT Abdomen and Pelvis 06/01/2021 and earlier. FINDINGS: Portable AP supine view at 0759 hours. Stable configuration of the left upper quadrant distal duodenum and pelvic sigmoid colon stents. Bowel-gas pattern has mildly improved since 06/01/2021, but gas distended proximal colon persists. No definite pneumoperitoneum on this supine view. Stable visible lung bases. Stable cholecystectomy clips. Stable visualized osseous structures. IMPRESSION: Mildly improved bowel gas pattern since the CT on 06/01/2021, although a degree of partial large bowel obstruction may persist. Electronically Signed   By: Genevie Ann M.D.   On: 06/04/2021 08:38   DG Abd 1 View  Result Date: 06/03/2021 CLINICAL DATA:  Colonic obstruction.  Colon stent in place. EXAM: ABDOMEN - 1 VIEW  COMPARISON:  CT 2 days ago FINDINGS: No obstructive bowel gas pattern presently. Intraluminal stents in the distal sigmoid colon and in the ligament of Treitz region of the proximal small intestine. Previous cholecystectomy clips also noted. Previous gastric sutures. IMPRESSION: Proximal small bowel and distal large bowel stents in place. Radiography does not show an ongoing obstructive pattern. Electronically Signed   By: Nelson Chimes M.D.   On: 06/03/2021 09:50   DG Fluoro Rm 1-60 Min  Result Date: 05/26/2021 CLINICAL DATA:  Colonic stent placement. EXAM: DG C-ARM 1-60 MIN CONTRAST:  None. FLUOROSCOPY TIME:  Fluoroscopy Time:  2 minutes 29 seconds Number of Acquired Spot Images: 6 COMPARISON:  Barium enema 05/24/2021. CT abdomen and pelvis 05/18/2021. FINDINGS: A stent has been placed over the  region of the sigmoid colon. Narrowing of the distal portion of the stent noted. IMPRESSION: Stent placement as above Electronically Signed   By: Marcello Moores  Register   On: 05/26/2021 12:29   DG Chest Port 1 View  Result Date: 06/11/2021 CLINICAL DATA:  Sepsis EXAM: PORTABLE CHEST 1 VIEW COMPARISON:  CT abdomen pelvis 06/09/2021 FINDINGS: Implantable right IJ approach Port-A-Cath tip is currently accessed. Tip positioned at the superior cavoatrial junction. Transesophageal tube tip and side port terminate distal to the GE junction. Telemetry leads and external support devices overlie the chest. There are multifocal nodular opacities throughout both lung bases. More patchy consolidative opacities seen in the right lung base medially. Findings on a background of coarsened interstitial change and basilar scarring. No pneumothorax. No effusion. The aorta is calcified. The remaining cardiomediastinal contours are unremarkable. No acute osseous or soft tissue abnormality. Degenerative changes are present in the imaged spine and shoulders. IMPRESSION: Lines and tubes as above. Multiple nodular opacities present in the bilateral lung bases, as seen on recent CT imaging of the abdomen and pelvis. More patchy opacity in the right medial lung base, could reflect aspiration, airspace disease or atelectasis. Aortic Atherosclerosis (ICD10-I70.0). Electronically Signed   By: Lovena Le M.D.   On: 06/11/2021 17:18   DG Naso G Tube Plc W/Fl W/Rad  Result Date: 06/11/2021 CLINICAL DATA:  Neoplastic obstruction of the distal sigmoid colon. History of pancreatic cancer. Stenting of the sigmoid colon and proximal jejunum. EXAM: NASO G TUBE PLACEMENT WITH FL AND WITH RAD CONTRAST:  None FLUOROSCOPY TIME:  Fluoroscopy Time:  48 seconds Radiation Exposure Index (if provided by the fluoroscopic device): 3.5 Number of Acquired Spot Images: 1 COMPARISON:  CT 06/09/2021 FINDINGS: NG tube was advanced through the RIGHT nostril into the  esophagus and ultimately into the stomach. Tip the NG tube was advanced to the pyloric region of the stomach/first portion duodenum. IMPRESSION: NG tube advanced intp the distal stomach/proximal duodenum. Electronically Signed   By: Suzy Bouchard M.D.   On: 06/11/2021 12:34   DG BE (COLON)W SINGLE CM (SOL OR THIN BA)  Result Date: 05/24/2021 CLINICAL DATA:  Sigmoid stricture. EXAM: ESOPHOGRAM/BARIUM SWALLOW TECHNIQUE: Single contrast examination was performed using 50% diluted water soluble contrast. FLUOROSCOPY TIME:  Radiation Exposure Index (if provided by the fluoroscopic device): 559.8 mGy. COMPARISON:  May 18, 2021. FINDINGS: There is noted a moderate persistent stricture near the rectosigmoid junction. Extensive diverticulosis is noted in the sigmoid colon which significantly obscures evaluation of the colon. Initially contrast would not advanced through a portion of the proximal sigmoid colon which grossly corresponds to stricture seen on CT scan. Eventually contrast did pass through with filling of the more proximal descending colon, which is  full of stool. Evaluation of the stricture is limited due to overlapping loops of colon. There does appear to be a moderate to severe narrowing involving the proximal sigmoid colon which potentially may dilated some, but this cannot be said with certainty due to above limitations. IMPRESSION: Difficult exam due to overlying loops of colon as well as extensive diverticulosis. There does appear to be a moderate to severe narrowing or stricture involving the proximal sigmoid colon which corresponds roughly to the abnormality seen on prior CT scan. Eventually contrast was passed through this area into the more proximal descending colon, which is full of stool. It cannot be said with certainty if there was any significant dilatation or change in caliber with this stricture throughout the exam. As noted in the findings, there is also a moderate persistent stricture in  the region of the rectosigmoid junction which does not appear to cause significant obstruction. Electronically Signed   By: Marijo Conception M.D.   On: 05/24/2021 14:59       Discharge Exam: Vitals:   06/17/21 2103 06/18/21 0525  BP: (!) 164/85 (!) 149/86  Pulse: 70 66  Resp: 18 16  Temp: 98.2 F (36.8 C) 98.1 F (36.7 C)  SpO2: 97% 97%    General: Pt is alert, awake, not in acute distress Cardiovascular: RRR, S1/S2 +, no edema Respiratory: CTA bilaterally, no wheezing, no rhonchi, no respiratory distress, no conversational dyspnea  Abdominal: Soft, NT, ND, bowel sounds +, +colostomy and fistula drain in place Extremities: no edema, no cyanosis Psych: Normal mood and affect, stable judgement and insight     The results of significant diagnostics from this hospitalization (including imaging, microbiology, ancillary and laboratory) are listed below for reference.     Microbiology: Recent Results (from the past 240 hour(s))  Resp Panel by RT-PCR (Flu A&B, Covid) Nasopharyngeal Swab     Status: None   Collection Time: 06/09/21  1:41 PM   Specimen: Nasopharyngeal Swab; Nasopharyngeal(NP) swabs in vial transport medium  Result Value Ref Range Status   SARS Coronavirus 2 by RT PCR NEGATIVE NEGATIVE Final    Comment: (NOTE) SARS-CoV-2 target nucleic acids are NOT DETECTED.  The SARS-CoV-2 RNA is generally detectable in upper respiratory specimens during the acute phase of infection. The lowest concentration of SARS-CoV-2 viral copies this assay can detect is 138 copies/mL. A negative result does not preclude SARS-Cov-2 infection and should not be used as the sole basis for treatment or other patient management decisions. A negative result may occur with  improper specimen collection/handling, submission of specimen other than nasopharyngeal swab, presence of viral mutation(s) within the areas targeted by this assay, and inadequate number of viral copies(<138 copies/mL). A  negative result must be combined with clinical observations, patient history, and epidemiological information. The expected result is Negative.  Fact Sheet for Patients:  EntrepreneurPulse.com.au  Fact Sheet for Healthcare Providers:  IncredibleEmployment.be  This test is no t yet approved or cleared by the [place] FDA and  has been authorized for detection and/or diagnosis of SARS-CoV-2 by FDA under an Emergency Use Authorization (EUA). This EUA will remain  in effect (meaning this test can be used) for the duration of the COVID-19 declaration under Section 564(b)(1) of the Act, 21 U.S.C.section 360bbb-3(b)(1), unless the authorization is terminated  or revoked sooner.       Influenza A by PCR NEGATIVE NEGATIVE Final   Influenza B by PCR NEGATIVE NEGATIVE Final    Comment: (NOTE) The Xpert Xpress SARS-CoV-2/FLU/RSV  plus assay is intended as an aid in the diagnosis of influenza from Nasopharyngeal swab specimens and should not be used as a sole basis for treatment. Nasal washings and aspirates are unacceptable for Xpert Xpress SARS-CoV-2/FLU/RSV testing.  Fact Sheet for Patients: EntrepreneurPulse.com.au  Fact Sheet for Healthcare Providers: IncredibleEmployment.be  This test is not yet approved or cleared by the [place] FDA and has been authorized for detection and/or diagnosis of SARS-CoV-2 by FDA under an Emergency Use Authorization (EUA). This EUA will remain in effect (meaning this test can be used) for the duration of the COVID-19 declaration under Section 564(b)(1) of the Act, 21 U.S.C. section 360bbb-3(b)(1), unless the authorization is terminated or revoked.  Performed at [facility], [address] [place], [place] [address]   Culture, blood (x 2)     Status: None   Collection Time: 06/11/21  5:24 PM   Specimen: BLOOD  Result Value Ref Range Status   Specimen  Description   Final    BLOOD LEFT ANTECUBITAL UPPER Performed at [facility] [address], [place], [place] [address]    Special Requests   Final    Blood Culture adequate volume BOTTLES DRAWN AEROBIC AND ANAEROBIC   Culture   Final    NO GROWTH 5 DAYS Performed at [facility], [place] [address] [place], [place] [address]    Report Status 06/16/2021 FINAL  Final  Culture, blood (x 2)     Status: None   Collection Time: 06/11/21  5:25 PM   Specimen: BLOOD  Result Value Ref Range Status   Specimen Description   Final    BLOOD LEFT ANTECUBITAL LOWER Performed at [facility] [address] [place], [place] [address]    Special Requests   Final    BOTTLES DRAWN AEROBIC AND ANAEROBIC Blood Culture adequate volume Performed at [facility] [address] [place], [place] [address]    Culture   Final    NO GROWTH 5 DAYS Performed at [facility], [place] [address] [place],  [address]    Report Status 06/16/2021 FINAL  Final     Labs: BNP (last 3 results) No results for input(s): BNP in the last 8760 hours. Basic Metabolic Panel: Recent Labs  Lab 06/12/21 0246 06/13/21 0504 06/14/21 0449 06/14/21 2138 06/15/21 0850 06/16/21 0120 06/17/21 0425 06/18/21 0409  NA 137 140 139  --  136 137 137 137  K 3.8 3.0* 3.9  --  4.5 4.0 4.0 3.8  CL 105 106 110  --  107 108 105 105  CO2 22 26 25   --  23 24 27 26   GLUCOSE 118* 121* 135*  --  121* 145* 100* 94  BUN 32* 24* 20  --  20 19 17 17   CREATININE 1.61* 1.31* 1.19 1.33* 1.46* 1.47* 1.54* 1.54*  CALCIUM 8.1* 8.4* 8.1*  --  7.9* 7.7* 8.0* 8.2*  MG 1.7 2.1 1.7  --  1.8 1.9  --   --   PHOS 3.2 2.7 2.9  --  3.6 3.1  --   --    Liver Function Tests: Recent Labs  Lab 06/11/21 1907 06/12/21 0246 06/13/21 0504 06/14/21 0449  AST 27 24 18 16   ALT 23 23 15 14   ALKPHOS 69 65 64 56  BILITOT 0.4 0.6 0.4 0.4  PROT 5.5* 5.3* 5.7* 5.1*  ALBUMIN 2.5* 2.7*  2.6* 2.2*   No results for input(s):  LIPASE, AMYLASE in the last 168 hours. No results for input(s): AMMONIA in the last 168 hours. CBC: Recent Labs  Lab 06/14/21 0449 06/14/21 2138 06/16/21 0120 06/17/21 0425 06/18/21 0409  WBC 20.3* 39.9* 25.5* 18.0* 16.1*  NEUTROABS 16.2*  --   --   --   --   HGB 11.3* 11.5* 9.6* 9.4* 9.3*  HCT 35.1* 35.1* 29.3* 28.5* 29.2*  MCV 98.6 97.5 97.0 97.6 98.0  PLT 418* 486* 456* 498* 552*   Cardiac Enzymes: No results for input(s): CKTOTAL, CKMB, CKMBINDEX, TROPONINI in the last 168 hours. BNP: Invalid input(s): POCBNP CBG: Recent Labs  Lab 06/16/21 0007 06/16/21 0550 06/16/21 1153 06/16/21 1715 06/17/21 0621  GLUCAP 160* 102* 115* 120* 90   D-Dimer No results for input(s): DDIMER in the last 72 hours. Hgb A1c No results for input(s): HGBA1C in the last 72 hours. Lipid Profile No results for input(s): CHOL, HDL, LDLCALC, TRIG, CHOLHDL, LDLDIRECT in the last 72 hours. Thyroid function studies No results for input(s): TSH, T4TOTAL, T3FREE, THYROIDAB in the last 72 hours.  Invalid input(s): FREET3 Anemia work up No results for input(s): VITAMINB12, FOLATE, FERRITIN, TIBC, IRON, RETICCTPCT in the last 72 hours. Urinalysis    Component Value Date/Time   COLORURINE YELLOW 06/09/2021 1223   APPEARANCEUR CLEAR 06/09/2021 1223   LABSPEC 1.025 06/09/2021 1223   PHURINE 5.5 06/09/2021 1223   GLUCOSEU NEGATIVE 06/09/2021 1223   HGBUR NEGATIVE 06/09/2021 1223   BILIRUBINUR NEGATIVE 06/09/2021 1223   KETONESUR NEGATIVE 06/09/2021 1223   PROTEINUR NEGATIVE 06/09/2021 1223   UROBILINOGEN 0.2 08/23/2015 1555   NITRITE NEGATIVE 06/09/2021 1223   LEUKOCYTESUR NEGATIVE 06/09/2021 1223   Sepsis Labs Invalid input(s): PROCALCITONIN,  WBC,  LACTICIDVEN Microbiology Recent Results (from the past 240 hour(s))  Resp Panel by RT-PCR (Flu A&B, Covid) Nasopharyngeal Swab     Status: None   Collection Time: 06/09/21  1:41 PM   Specimen:  Nasopharyngeal Swab; Nasopharyngeal(NP) swabs in vial transport medium  Result Value Ref Range Status   SARS Coronavirus 2 by RT PCR NEGATIVE NEGATIVE Final    Comment: (NOTE) SARS-CoV-2 target nucleic acids are NOT DETECTED.  The SARS-CoV-2 RNA is generally detectable in upper respiratory specimens during the acute phase of infection. The lowest concentration of SARS-CoV-2 viral copies this assay can detect is 138 copies/mL. A negative result does not preclude SARS-Cov-2 infection and should not be used as the sole basis for treatment or other patient management decisions. A negative result may occur with  improper specimen collection/handling, submission of specimen other than nasopharyngeal swab, presence of viral mutation(s) within the areas targeted by this assay, and inadequate number of viral copies(<138 copies/mL). A negative result must be combined with clinical observations, patient history, and epidemiological information. The expected result is Negative.  Fact Sheet for Patients:  EntrepreneurPulse.com.au  Fact Sheet for Healthcare Providers:  IncredibleEmployment.be  This test is no t yet approved or cleared by the [place] FDA and  has been authorized for detection and/or diagnosis of SARS-CoV-2 by FDA under an Emergency Use Authorization (EUA). This EUA will remain  in effect (meaning this test can be used) for the duration of the COVID-19 declaration under Section 564(b)(1) of the Act, 21 U.S.C.section 360bbb-3(b)(1), unless the authorization is terminated  or revoked sooner.       Influenza A by PCR NEGATIVE NEGATIVE Final   Influenza B by PCR NEGATIVE NEGATIVE Final    Comment: (NOTE) The Xpert Xpress SARS-CoV-2/FLU/RSV plus assay is intended as  an aid in the diagnosis of influenza from Nasopharyngeal swab specimens and should not be used as a sole basis for treatment. Nasal washings and aspirates are unacceptable for  Xpert Xpress SARS-CoV-2/FLU/RSV testing.  Fact Sheet for Patients: EntrepreneurPulse.com.au  Fact Sheet for Healthcare Providers: IncredibleEmployment.be  This test is not yet approved or cleared by the [place] FDA and has been authorized for detection and/or diagnosis of SARS-CoV-2 by FDA under an Emergency Use Authorization (EUA). This EUA will remain in effect (meaning this test can be used) for the duration of the COVID-19 declaration under Section 564(b)(1) of the Act, 21 U.S.C. section 360bbb-3(b)(1), unless the authorization is terminated or revoked.  Performed at [facility], [address] [place], [place] [address]   Culture, blood (x 2)     Status: None   Collection Time: 06/11/21  5:24 PM   Specimen: BLOOD  Result Value Ref Range Status   Specimen Description   Final    BLOOD LEFT ANTECUBITAL UPPER Performed at [facility] [address], [place], [place] [address]    Special Requests   Final    Blood Culture adequate volume BOTTLES DRAWN AEROBIC AND ANAEROBIC   Culture   Final    NO GROWTH 5 DAYS Performed at [facility], [place] [address] [place], [place] [address]    Report Status 06/16/2021 FINAL  Final  Culture, blood (x 2)     Status: None   Collection Time: 06/11/21  5:25 PM   Specimen: BLOOD  Result Value Ref Range Status   Specimen Description   Final    BLOOD LEFT ANTECUBITAL LOWER Performed at [facility] [address] [place], [place] [address]    Special Requests   Final    BOTTLES DRAWN AEROBIC AND ANAEROBIC Blood Culture adequate volume Performed at [facility] [address], [place], [place] [address]    Culture   Final    NO GROWTH 5 DAYS Performed at [facility], [place] [address], [place], [place] [address]    Report Status 06/16/2021 FINAL  Final     Patient was seen and examined on the day of discharge  and was found to be in stable condition. Time coordinating discharge: 25 minutes including assessment and coordination of care, as well as examination of the patient.   SIGNED:  Dessa Phi, DO [facility] 06/18/2021, 10:50 AM

## 2021-06-18 NOTE — Progress Notes (Signed)
Patient discharged home.  IV removed - WNL.  IV team coming to de-access port.  Reviewed AVS and medications.  Instructed on incisional care and given some split gauzes and tape.  Patient verbalizes understanding on that as well as colostomy bag care.  Supplies packed in patients bags. Patient has no questions at this time.  Follow up with surgeon already in place.  Patient waiting on wife's arrival to transport home.  Patient in NAD at this time.

## 2021-06-18 NOTE — Progress Notes (Signed)
Hopefully, Kevin Villegas will go home today.  He is looking quite good.  He is out of bed.  He is eating.  There is no nausea or vomiting.  Pain seems to be doing well.  He has had no fever.  His white cell count 16.1.  Hemoglobin 9.3.  Platelet count 552,000.  BUN is 17 creatinine 1.54.  He has had no leg swelling issues.  He is at risk for thromboembolic disease.  I told him to make sure he tries to ambulate is much as possible.  His temperature is 98.1.  Pulse 66.  Blood pressure 149/86.  Head neck exam shows no scleral icterus.  There is no oral lesions.  Lungs are clear.  Cardiac exam regular rate and rhythm.  He has 1/6 systolic murmur.  Abdomen shows a colostomy in the left lower quadrant.  Bowel sounds are decreased.  There is no distention.  There is no guarding or rebound tenderness.  Extremity shows no clubbing, cyanosis or edema.  Neurological exam is nonfocal.  Again, Mr. Delima will hopefully go home today.  He had a colostomy to bypass the colonic obstruction.  I am sure this is secondary to malignancy.  We likely will get treatment started on him in a couple weeks.  By then, he should be healing well.  I know he will have close follow-up by surgery.  I appreciate all the hard work that was put in by the staff of [facility].  I appreciate their compassion and professional care.  Lattie Haw, MD  Proverbs 17:17

## 2021-06-18 NOTE — Plan of Care (Signed)
  Problem: Activity: Goal: Risk for activity intolerance will decrease Outcome: Progressing   Problem: Pain Managment: Goal: General experience of comfort will improve Outcome: Progressing   Problem: Nutrition: Goal: Adequate nutrition will be maintained Outcome: Adequate for Discharge

## 2021-06-18 NOTE — Discharge Instructions (Signed)
[facility]      [facility] [phone]  OPEN ABDOMINAL SURGERY: POST OP INSTRUCTIONS  Always review your discharge instruction sheet given to you by the facility where your surgery was performed.  IF YOU HAVE DISABILITY OR FAMILY LEAVE FORMS, YOU MUST BRING THEM TO THE OFFICE FOR PROCESSING.  PLEASE DO NOT GIVE THEM TO YOUR DOCTOR.  A prescription for pain medication may be given to you upon discharge.  Take your pain medication as prescribed, if needed.  If narcotic pain medicine is not needed, then you may take acetaminophen (Tylenol) or ibuprofen (Advil) as needed. Take your usually prescribed medications unless otherwise directed. If you need a refill on your pain medication, please contact your pharmacy. They will contact our office to request authorization.  Prescriptions will not be filled after 5pm or on week-ends. You should follow a light diet the first few days after arrival home, such as soup and crackers, pudding, etc.unless your doctor has advised otherwise. A high-fiber, low fat diet can be resumed as tolerated.   Be sure to include lots of fluids daily. Most patients will experience some swelling and bruising on the chest and neck area.  Ice packs will help.  Swelling and bruising can take several days to resolve Most patients will experience some swelling and bruising in the area of the incision. Ice pack will help. Swelling and bruising can take several days to resolve..  It is common to experience some constipation if taking pain medication after surgery.  Increasing fluid intake and taking a stool softener will usually help or prevent this problem from occurring.  A mild laxative (Milk of Magnesia or Miralax) should be taken according to package directions if there are no bowel movements after 48 hours.  You may have steri-strips (small skin tapes) in place directly over the incision.  These strips should be left on the skin for 7-10 days.  If your surgeon used skin  glue on the incision, you may shower in 24 hours.  The glue will flake off over the next 2-3 weeks.  Any sutures or staples will be removed at the office during your follow-up visit. You may find that a light gauze bandage over your incision may keep your staples from being rubbed or pulled. You may shower and replace the bandage daily. ACTIVITIES:  You may resume regular (light) daily activities beginning the next day--such as daily self-care, walking, climbing stairs--gradually increasing activities as tolerated.  You may have sexual intercourse when it is comfortable.  Refrain from any heavy lifting or straining until approved by your doctor. You may drive when you no longer are taking prescription pain medication, you can comfortably wear a seatbelt, and you can safely maneuver your car and apply brakes Return to Work: ___________________________________ You should see your doctor in the office for a follow-up appointment approximately two weeks after your surgery.  Make sure that you call for this appointment within a day or two after you arrive home to insure a convenient appointment time. OTHER INSTRUCTIONS:  _____________________________________________________________ _____________________________________________________________  WHEN TO CALL YOUR DOCTOR: Fever over 101.0 Inability to urinate Nausea and/or vomiting Extreme swelling or bruising Continued bleeding from incision. Increased pain, redness, or drainage from the incision. Difficulty swallowing or breathing Muscle cramping or spasms. Numbness or tingling in hands or feet or around lips.  The clinic staff is available to answer your questions during regular business hours.  Please don't hesitate to call and ask to speak to one of   the nurses if you have concerns.  For further questions, please visit [email or web address]  

## 2021-06-18 NOTE — Care Management Important Message (Signed)
Important Message  Patient Details IM Letter given to the Patient. Name: Abisai Deer MRN: [id number] Date of Birth: 1941-05-31   Medicare Important Message Given:  Yes     Kerin Salen 06/18/2021, 2:02 PM

## 2021-06-18 NOTE — Progress Notes (Signed)
Patient will likely be discharged today. Spoke with Dr Marin Olp and he'd like to see the patient in 2-3 weeks.   Patient scheduled. Patient aware of follow up appointment.   Oncology Nurse Navigator Documentation  Oncology Nurse Navigator Flowsheets 06/18/2021  Navigator Follow Up Date: 07/05/2021  Navigator Follow Up Reason: Follow-up Appointment  Navigator Location [facility]  Navigator Encounter Type Appt/Treatment Plan Review;Telephone  Telephone Outgoing Call;Appt Confirmation/Clarification  Patient Visit Type MedOnc  Treatment Phase Active Tx  Barriers/Navigation Needs Coordination of Care;Education  Education Other  Interventions Coordination of Care;Education;Psycho-Social Support  Acuity Level 2-Minimal Needs (1-2 Barriers Identified)  Coordination of Care Appts  Education Method Verbal  Support Groups/Services Friends and Family  Time Spent with Patient 30

## 2021-06-22 ENCOUNTER — Encounter: Payer: Self-pay | Admitting: *Deleted

## 2021-06-22 ENCOUNTER — Telehealth: Payer: Self-pay

## 2021-06-22 NOTE — Progress Notes (Signed)
Received a call from patient confirming the 7/18 appointment.   He also has questions about his surgical tube and dressing supplies. He needs some more dressing supplies. Reviewed with him different options like pharmacies, [employer], or [employer]. I told him I would reach out to his surgeons office to see if there were better ways to obtain supplies. Before I could make contact, patient returned call and stated that [facility] had called him about his follow up appointment and stated there wasn't better options than those I already provided for him.   Oncology Nurse Navigator Documentation  Oncology Nurse Navigator Flowsheets 06/22/2021  Navigator Follow Up Date: 07/05/2021  Navigator Follow Up Reason: Follow-up Appointment  Navigator Location [facility]  Navigator Encounter Type Telephone  Telephone Appt Confirmation/Clarification;Incoming Call;Symptom Mgt  Patient Visit Type MedOnc  Treatment Phase Active Tx  Barriers/Navigation Needs Coordination of Care;Education  Education Other  Interventions Coordination of Care;Education;Psycho-Social Support  Acuity Level 2-Minimal Needs (1-2 Barriers Identified)  Coordination of Care Appts  Education Method Verbal  Support Groups/Services Friends and Family  Time Spent with Patient 30

## 2021-06-22 NOTE — Telephone Encounter (Signed)
Transition Care Management Follow-up Telephone Call Date of discharge and from where: [name] 06/18/21 Diagnosis: colostomy placement - large bowel obstruction  How have you been since you were released from the hospital? Better, slow recovering, has a good appetite Any questions or concerns? No  Items Reviewed: Did the pt receive and understand the discharge instructions provided? Yes  Medications obtained and verified? Yes  Other? No  Any new allergies since your discharge? No  Dietary orders reviewed? Yes Do you have support at home? Yes   Home Care and Equipment/Supplies: Were home health services ordered? no If so, what is the name of the agency? N/a  Has the agency set up a time to come to the patient's home? not applicable Were any new equipment or medical supplies ordered?  Yes: colostomy supplies What is the name of the medical supply agency? [facility] Were you able to get the supplies/equipment? yes Do you have any questions related to the use of the equipment or supplies? No  Functional Questionnaire: (I = Independent and D = Dependent) ADLs: I  Bathing/Dressing- I  Meal Prep- I  Eating- I  Maintaining continence- I  Transferring/Ambulation- I  Managing Meds- I  Follow up appointments reviewed:  PCP Hospital f/u appt confirmed?  He declined appointment at this time - has f/u appts with surgeon, oncologies, GI - has 5 appointments in the next coupe of weeks.   [facility] f/u appt confirmed? Yes  Scheduled to see Ennever on 07/05/21 @ 12:45 - other appts as well - not wanting to give me a lot of information over the phone right now - not feeling well. Are transportation arrangements needed? No  If their condition worsens, is the pt aware to call PCP or go to the Emergency Dept.? Yes Was the patient provided with contact information for the PCP's office or ED? Yes Was to pt encouraged to call back with questions or concerns? Yes

## 2021-07-02 ENCOUNTER — Other Ambulatory Visit: Payer: Self-pay

## 2021-07-02 DIAGNOSIS — C252 Malignant neoplasm of tail of pancreas: Secondary | ICD-10-CM

## 2021-07-05 ENCOUNTER — Encounter: Payer: Self-pay | Admitting: Hematology & Oncology

## 2021-07-05 ENCOUNTER — Telehealth (HOSPITAL_COMMUNITY): Payer: Self-pay

## 2021-07-05 ENCOUNTER — Other Ambulatory Visit (HOSPITAL_COMMUNITY): Payer: Self-pay | Admitting: Surgery

## 2021-07-05 ENCOUNTER — Inpatient Hospital Stay: Payer: Medicare Other | Attending: Hematology & Oncology

## 2021-07-05 ENCOUNTER — Inpatient Hospital Stay: Payer: Medicare Other

## 2021-07-05 ENCOUNTER — Other Ambulatory Visit: Payer: Self-pay

## 2021-07-05 ENCOUNTER — Inpatient Hospital Stay (HOSPITAL_BASED_OUTPATIENT_CLINIC_OR_DEPARTMENT_OTHER): Payer: Medicare Other | Admitting: Hematology & Oncology

## 2021-07-05 VITALS — BP 147/94 | HR 71 | Temp 98.0°F | Resp 17 | Wt 143.1 lb

## 2021-07-05 DIAGNOSIS — Z933 Colostomy status: Secondary | ICD-10-CM | POA: Diagnosis not present

## 2021-07-05 DIAGNOSIS — C259 Malignant neoplasm of pancreas, unspecified: Secondary | ICD-10-CM | POA: Diagnosis not present

## 2021-07-05 DIAGNOSIS — C252 Malignant neoplasm of tail of pancreas: Secondary | ICD-10-CM

## 2021-07-05 DIAGNOSIS — C799 Secondary malignant neoplasm of unspecified site: Secondary | ICD-10-CM | POA: Diagnosis present

## 2021-07-05 DIAGNOSIS — Z9049 Acquired absence of other specified parts of digestive tract: Secondary | ICD-10-CM

## 2021-07-05 LAB — CBC WITH DIFFERENTIAL (CANCER CENTER ONLY)
Abs Immature Granulocytes: 0.04 10*3/uL (ref 0.00–0.07)
Basophils Absolute: 0.1 10*3/uL (ref 0.0–0.1)
Basophils Relative: 1 %
Eosinophils Absolute: 0.2 10*3/uL (ref 0.0–0.5)
Eosinophils Relative: 2 %
HCT: 33.2 % — ABNORMAL LOW (ref 39.0–52.0)
Hemoglobin: 10.7 g/dL — ABNORMAL LOW (ref 13.0–17.0)
Immature Granulocytes: 0 %
Lymphocytes Relative: 19 %
Lymphs Abs: 2 10*3/uL (ref 0.7–4.0)
MCH: 31.6 pg (ref 26.0–34.0)
MCHC: 32.2 g/dL (ref 30.0–36.0)
MCV: 97.9 fL (ref 80.0–100.0)
Monocytes Absolute: 1.4 10*3/uL — ABNORMAL HIGH (ref 0.1–1.0)
Monocytes Relative: 13 %
Neutro Abs: 6.6 10*3/uL (ref 1.7–7.7)
Neutrophils Relative %: 65 %
Platelet Count: 653 10*3/uL — ABNORMAL HIGH (ref 150–400)
RBC: 3.39 MIL/uL — ABNORMAL LOW (ref 4.22–5.81)
RDW: 14.2 % (ref 11.5–15.5)
WBC Count: 10.4 10*3/uL (ref 4.0–10.5)
nRBC: 0 % (ref 0.0–0.2)

## 2021-07-05 LAB — CMP (CANCER CENTER ONLY)
ALT: 46 U/L — ABNORMAL HIGH (ref 0–44)
AST: 45 U/L — ABNORMAL HIGH (ref 15–41)
Albumin: 3.7 g/dL (ref 3.5–5.0)
Alkaline Phosphatase: 69 U/L (ref 38–126)
Anion gap: 9 (ref 5–15)
BUN: 21 mg/dL (ref 8–23)
CO2: 25 mmol/L (ref 22–32)
Calcium: 9.6 mg/dL (ref 8.9–10.3)
Chloride: 102 mmol/L (ref 98–111)
Creatinine: 1.39 mg/dL — ABNORMAL HIGH (ref 0.61–1.24)
GFR, Estimated: 52 mL/min — ABNORMAL LOW (ref >60)
Glucose, Bld: 86 mg/dL (ref 70–99)
Potassium: 4.4 mmol/L (ref 3.5–5.1)
Sodium: 136 mmol/L (ref 135–145)
Total Bilirubin: 0.3 mg/dL (ref 0.3–1.2)
Total Protein: 7.5 g/dL (ref 6.5–8.1)

## 2021-07-05 LAB — PREALBUMIN: Prealbumin: 17.8 mg/dL — ABNORMAL LOW (ref 18–38)

## 2021-07-05 MED ORDER — SODIUM CHLORIDE 0.9% FLUSH
10.0000 mL | Freq: Once | INTRAVENOUS | Status: AC
Start: 2021-07-05 — End: 2021-07-05
  Administered 2021-07-05: 10 mL via INTRAVENOUS
  Filled 2021-07-05: qty 10

## 2021-07-05 MED ORDER — HEPARIN SOD (PORK) LOCK FLUSH 100 UNIT/ML IV SOLN
500.0000 [IU] | Freq: Once | INTRAVENOUS | Status: AC
  Administered 2021-07-05: 500 [IU] via INTRAVENOUS
  Filled 2021-07-05: qty 5

## 2021-07-05 NOTE — Progress Notes (Signed)
Hematology and Oncology Follow Up Visit  Kevin Villegas [id number] 20-Dec-1940 80 y.o. 07/05/2021   Principle Diagnosis:  Metastatic pancreatic adenocarcinoma  Current Therapy:   Observation     Interim History:  Kevin Villegas is back for follow-up.  He has been having problems with respect to obstruction of the large bowel.  He ultimately had surgery.  Has a colostomy bag right now.  He is going to see Dr. Hassell Done of surgery later on this week.  Apparently some outpatient evaluation is going to be done.  He had surgery back on 06/14/2021.  The pathology report ([id number]) showed a metallic stent.  There is no evidence of malignancy.  There is hyperplastic changes in the colonic mucosa.  He feels okay.  He is eating okay.  The colostomy seems to be working well.  He does not wish to have any chemotherapy right now.  He just does not feel like he is able to tolerate chemotherapy.  His last CA 19-9 back a couple months ago was only 3.3.  He has had no problems with cough or shortness of breath.  He has had no rashes.  There is been no fever.  He has had no leg swelling.  There is no bleeding.  Overall, his performance status is ECOG 1.    Medications:  Current Outpatient Medications:    oxyCODONE (OXY IR/ROXICODONE) 5 MG immediate release tablet, Take 1 tablet (5 mg total) by mouth every 6 (six) hours as needed for breakthrough pain., Disp: 15 tablet, Rfl: 0  Allergies:  Allergies  Allergen Reactions   Pollen Extract Other (See Comments)    Sinus congestion    Past Medical History, Surgical history, Social history, and Family History were reviewed and updated.  Review of Systems: Review of Systems  Constitutional: Negative.   HENT:  Negative.    Eyes: Negative.   Respiratory: Negative.    Cardiovascular: Negative.   Gastrointestinal: Negative.   Endocrine: Negative.   Genitourinary: Negative.    Musculoskeletal: Negative.   Neurological: Negative.    Hematological: Negative.   Psychiatric/Behavioral: Negative.     Physical Exam:  weight is 143 lb 1.9 oz (64.9 kg). His oral temperature is 98 F (36.7 C). His blood pressure is 147/94 (abnormal) and his pulse is 71. His respiration is 17 and oxygen saturation is 100%.   Wt Readings from Last 3 Encounters:  07/05/21 143 lb 1.9 oz (64.9 kg)  06/14/21 147 lb 11.3 oz (67 kg)  06/04/21 147 lb 7.8 oz (66.9 kg)    Physical Exam Vitals reviewed.  HENT:     Head: Normocephalic and atraumatic.  Eyes:     Pupils: Pupils are equal, round, and reactive to light.  Cardiovascular:     Rate and Rhythm: Normal rate and regular rhythm.     Heart sounds: Normal heart sounds.  Pulmonary:     Effort: Pulmonary effort is normal.     Breath sounds: Normal breath sounds.  Abdominal:     General: Bowel sounds are normal.     Palpations: Abdomen is soft.     Comments: His abdomen is soft.  He has a colostomy in the lower abdomen.  The colostomy is intact.  There is no fluid wave.  There is no guarding or rebound tenderness.  He has no abdominal mass.  There is no palpable liver or spleen tip.  Musculoskeletal:        General: No tenderness or deformity. Normal range of motion.  Cervical back: Normal range of motion.  Lymphadenopathy:     Cervical: No cervical adenopathy.  Skin:    General: Skin is warm and dry.     Findings: No erythema or rash.  Neurological:     Mental Status: He is alert and oriented to person, place, and time.  Psychiatric:        Behavior: Behavior normal.        Thought Content: Thought content normal.        Judgment: Judgment normal.     Lab Results  Component Value Date   WBC 10.4 07/05/2021   HGB 10.7 (L) 07/05/2021   HCT 33.2 (L) 07/05/2021   MCV 97.9 07/05/2021   PLT 653 (H) 07/05/2021     Chemistry      Component Value Date/Time   NA 136 07/05/2021 1201   NA 141 03/22/2017 1101   K 4.4 07/05/2021 1201   CL 102 07/05/2021 1201   CO2 25 07/05/2021  1201   BUN 21 07/05/2021 1201   BUN 17 03/22/2017 1101   CREATININE 1.39 (H) 07/05/2021 1201      Component Value Date/Time   CALCIUM 9.6 07/05/2021 1201   ALKPHOS 69 07/05/2021 1201   AST 45 (H) 07/05/2021 1201   ALT 46 (H) 07/05/2021 1201   BILITOT 0.3 07/05/2021 1201      Impression and Plan: Kevin Villegas is a very nice 80 year old white male.  He has metastatic pancreatic cancer.  He has had a lot of problems with bowel obstruction.  He finally had surgery.  Has a colostomy.  From what he says, the surgeon wants to eventually reverse the colostomy.  I am not sure what kind of studies he will be having done this week.  Sounds like may have a barium enema.  For right now, we will just hold on treatment for him.  I think this would be quite reasonable.  Quality of life is really what we are are looking at.  We really want his quality life to be good.  I think if we tried to treat him now, his quality of life clearly would go down.  I would like to see him back in 4-5 weeks.  If he is in the hospital I will see him in the hospital.   Volanda Napoleon, MD 7/18/20221:44 PM

## 2021-07-05 NOTE — Progress Notes (Signed)
START ON PATHWAY REGIMEN - Pancreatic Adenocarcinoma     A cycle is every 28 days:     Nab-paclitaxel (protein bound)      Gemcitabine   **Always confirm dose/schedule in your pharmacy ordering system**  Patient Characteristics: Metastatic Disease, First Line, PS = 0,1, BRCA1/2 and PALB2  Mutation Absent/Unknown Therapeutic Status: Metastatic Disease Line of Therapy: First Line ECOG Performance Status: 1 BRCA1/2 Mutation Status: Absent PALB2 Mutation Status: Absent Intent of Therapy: Non-Curative / Palliative Intent, Discussed with Patient

## 2021-07-05 NOTE — Patient Instructions (Signed)
Implanted Port Home Guide An implanted port is a device that is placed under the skin. It is usually placed in the chest. The device can be used to give IV medicine, to take blood, or for dialysis. You may have an implanted port if: You need IV medicine that would be irritating to the small veins in your hands or arms. You need IV medicines, such as antibiotics, for a long period of time. You need IV nutrition for a long period of time. You need dialysis. When you have a port, your health care provider can choose to use the port instead of veins in your arms for these procedures. You may have fewer limitations when using a port than you would if you used other types of long-term IVs, and you will likely be able to return to normal activities afteryour incision heals. An implanted port has two main parts: Reservoir. The reservoir is the part where a needle is inserted to give medicines or draw blood. The reservoir is round. After it is placed, it appears as a small, raised area under your skin. Catheter. The catheter is a thin, flexible tube that connects the reservoir to a vein. Medicine that is inserted into the reservoir goes into the catheter and then into the vein. How is my port accessed? To access your port: A numbing cream may be placed on the skin over the port site. Your health care provider will put on a mask and sterile gloves. The skin over your port will be cleaned carefully with a germ-killing soap and allowed to dry. Your health care provider will gently pinch the port and insert a needle into it. Your health care provider will check for a blood return to make sure the port is in the vein and is not clogged. If your port needs to remain accessed to get medicine continuously (constant infusion), your health care provider will place a clear bandage (dressing) over the needle site. The dressing and needle will need to be changed every week, or as told by your health care provider. What  is flushing? Flushing helps keep the port from getting clogged. Follow instructions from your health care provider about how and when to flush the port. Ports are usually flushed with saline solution or a medicine called heparin. The need for flushing will depend on how the port is used: If the port is only used from time to time to give medicines or draw blood, the port may need to be flushed: Before and after medicines have been given. Before and after blood has been drawn. As part of routine maintenance. Flushing may be recommended every 4-6 weeks. If a constant infusion is running, the port may not need to be flushed. Throw away any syringes in a disposal container that is meant for sharp items (sharps container). You can buy a sharps container from a pharmacy, or you can make one by using an empty hard plastic bottle with a cover. How long will my port stay implanted? The port can stay in for as long as your health care provider thinks it is needed. When it is time for the port to come out, a surgery will be done to remove it. The surgery will be similar to the procedure that was done to putthe port in. Follow these instructions at home:  Flush your port as told by your health care provider. If you need an infusion over several days, follow instructions from your health care provider about how to take   care of your port site. Make sure you: Wash your hands with soap and water before you change your dressing. If soap and water are not available, use alcohol-based hand sanitizer. Change your dressing as told by your health care provider. Place any used dressings or infusion bags into a plastic bag. Throw that bag in the trash. Keep the dressing that covers the needle clean and dry. Do not get it wet. Do not use scissors or sharp objects near the tube. Keep the tube clamped, unless it is being used. Check your port site every day for signs of infection. Check for: Redness, swelling, or  pain. Fluid or blood. Pus or a bad smell. Protect the skin around the port site. Avoid wearing bra straps that rub or irritate the site. Protect the skin around your port from seat belts. Place a soft pad over your chest if needed. Bathe or shower as told by your health care provider. The site may get wet as long as you are not actively receiving an infusion. Return to your normal activities as told by your health care provider. Ask your health care provider what activities are safe for you. Carry a medical alert card or wear a medical alert bracelet at all times. This will let health care providers know that you have an implanted port in case of an emergency. Get help right away if: You have redness, swelling, or pain at the port site. You have fluid or blood coming from your port site. You have pus or a bad smell coming from the port site. You have a fever. Summary Implanted ports are usually placed in the chest for long-term IV access. Follow instructions from your health care provider about flushing the port and changing bandages (dressings). Take care of the area around your port by avoiding clothing that puts pressure on the area, and by watching for signs of infection. Protect the skin around your port from seat belts. Place a soft pad over your chest if needed. Get help right away if you have a fever or you have redness, swelling, pain, drainage, or a bad smell at the port site. This information is not intended to replace advice given to you by your health care provider. Make sure you discuss any questions you have with your healthcare provider. Document Revised: 04/20/2020 Document Reviewed: 04/20/2020 Elsevier Patient Education  2022 [employer]  

## 2021-07-06 ENCOUNTER — Telehealth: Payer: Self-pay

## 2021-07-06 LAB — CANCER ANTIGEN 19-9: CA 19-9: 9 U/mL (ref 0–35)

## 2021-07-13 ENCOUNTER — Other Ambulatory Visit: Payer: Self-pay

## 2021-07-13 ENCOUNTER — Ambulatory Visit (HOSPITAL_COMMUNITY)
Admission: RE | Admit: 2021-07-13 | Discharge: 2021-07-13 | Disposition: A | Discharge status: Home / Self Care | Payer: Medicare Other | Source: Ambulatory Visit | Attending: Surgery | Admitting: Surgery

## 2021-07-13 ENCOUNTER — Encounter: Payer: Self-pay | Admitting: Hematology & Oncology

## 2021-07-13 ENCOUNTER — Encounter: Payer: Self-pay | Admitting: *Deleted

## 2021-07-13 DIAGNOSIS — Z933 Colostomy status: Secondary | ICD-10-CM | POA: Insufficient documentation

## 2021-07-13 MED ORDER — IOHEXOL 9 MG/ML PO SOLN: 1000.0000 mL | ORAL | Status: AC

## 2021-07-13 MED ORDER — IOHEXOL 9 MG/ML PO SOLN
ORAL | Status: AC
  Administered 2021-07-13: 1000 mL via ORAL
  Filled 2021-07-13: qty 1000

## 2021-07-13 NOTE — Progress Notes (Signed)
Patient continues to recover from surgery. He requests to defer any chemotherapy at this time.   Oncology Nurse Navigator Documentation  Oncology Nurse Navigator Flowsheets 07/13/2021  Navigator Follow Up Date: 07/14/2021  Navigator Follow Up Reason: Scan Review  Navigator Location [facility]  Navigator Encounter Type Appt/Treatment Plan Review  Telephone -  Patient Visit Type MedOnc  Treatment Phase Active Tx  Barriers/Navigation Needs Coordination of Care;Education  Education -  Interventions None Required  Acuity Level 2-Minimal Needs (1-2 Barriers Identified)  Coordination of Care -  Education Method -  Support Groups/Services Friends and Family  Time Spent with Patient 15

## 2021-07-14 ENCOUNTER — Encounter: Payer: Self-pay | Admitting: Hematology & Oncology

## 2021-07-15 ENCOUNTER — Encounter: Payer: Self-pay | Admitting: *Deleted

## 2021-07-15 NOTE — Progress Notes (Signed)
Shared results with Dr Marin Olp as he wasn't the ordering physician.   Oncology Nurse Navigator Documentation  Oncology Nurse Navigator Flowsheets 07/15/2021  Navigator Follow Up Date: 09/08/2021  Navigator Follow Up Reason: Follow-up Appointment  Navigator Location [facility]  Navigator Encounter Type Scan Review  Telephone -  Patient Visit Type MedOnc  Treatment Phase Active Tx  Barriers/Navigation Needs Coordination of Care;Education  Education -  Interventions Coordination of Care  Acuity Level 2-Minimal Needs (1-2 Barriers Identified)  Coordination of Care Other  Education Method -  Support Groups/Services Friends and Family  Time Spent with Patient 15

## 2021-09-02 ENCOUNTER — Encounter: Payer: Self-pay | Admitting: *Deleted

## 2021-09-02 NOTE — Progress Notes (Signed)
Patient calling to schedule a port flush. Upon review of his appointments, patient was already scheduled for port flush with labs and MD visit on 09/08/2021. Patient states he was not aware of this appointment, and requests that it be cancelled due to conflict with other appointment. Port flush scheduled for 09/09/21.  Patient has a follow up appointment with his surgeon, Dr Hassell Done, on 09/08/2021. He states that he needs to schedule his colostomy reversal and determine "what is going on inside of me" surgically before he wants to follow back up with Dr Marin Olp. At this time he does not want to schedule an appointment with Dr Marin Olp.   Dr Marin Olp updated to the above.   Oncology Nurse Navigator Documentation  Oncology Nurse Navigator Flowsheets 09/02/2021  Navigator Follow Up Date: 09/20/2021  Navigator Follow Up Reason: Other:  Navigator Location [facility]  Navigator Encounter Type Telephone  Telephone Appt Confirmation/Clarification;Patient Update;Incoming Call  Patient Visit Type MedOnc  Treatment Phase Active Tx  Barriers/Navigation Needs Coordination of Care;Education  Education -  Interventions Coordination of Care;Psycho-Social Support  Acuity Level 2-Minimal Needs (1-2 Barriers Identified)  Coordination of Care Appts  Education Method -  Support Groups/Services Friends and Family  Time Spent with Patient 30

## 2021-09-08 ENCOUNTER — Ambulatory Visit: Payer: Medicare Other | Admitting: Hematology & Oncology

## 2021-09-08 ENCOUNTER — Other Ambulatory Visit: Payer: Medicare Other

## 2021-09-10 ENCOUNTER — Inpatient Hospital Stay: Payer: Medicare Other

## 2021-09-10 ENCOUNTER — Other Ambulatory Visit: Payer: Self-pay

## 2021-09-10 ENCOUNTER — Inpatient Hospital Stay: Payer: Medicare Other | Attending: Hematology & Oncology

## 2021-09-10 ENCOUNTER — Encounter: Payer: Self-pay | Admitting: *Deleted

## 2021-09-10 VITALS — BP 176/100 | HR 63 | Temp 98.1°F | Resp 17

## 2021-09-10 DIAGNOSIS — C799 Secondary malignant neoplasm of unspecified site: Secondary | ICD-10-CM | POA: Diagnosis present

## 2021-09-10 DIAGNOSIS — C259 Malignant neoplasm of pancreas, unspecified: Secondary | ICD-10-CM | POA: Diagnosis not present

## 2021-09-10 DIAGNOSIS — C252 Malignant neoplasm of tail of pancreas: Secondary | ICD-10-CM

## 2021-09-10 DIAGNOSIS — Z95828 Presence of other vascular implants and grafts: Secondary | ICD-10-CM

## 2021-09-10 LAB — CMP (CANCER CENTER ONLY)
ALT: 13 U/L (ref 0–44)
AST: 23 U/L (ref 15–41)
Albumin: 4 g/dL (ref 3.5–5.0)
Alkaline Phosphatase: 58 U/L (ref 38–126)
Anion gap: 8 (ref 5–15)
BUN: 22 mg/dL (ref 8–23)
CO2: 27 mmol/L (ref 22–32)
Calcium: 9.3 mg/dL (ref 8.9–10.3)
Chloride: 102 mmol/L (ref 98–111)
Creatinine: 1.46 mg/dL — ABNORMAL HIGH (ref 0.61–1.24)
GFR, Estimated: 49 mL/min — ABNORMAL LOW (ref >60)
Glucose, Bld: 86 mg/dL (ref 70–99)
Potassium: 4.8 mmol/L (ref 3.5–5.1)
Sodium: 137 mmol/L (ref 135–145)
Total Bilirubin: 0.3 mg/dL (ref 0.3–1.2)
Total Protein: 7.5 g/dL (ref 6.5–8.1)

## 2021-09-10 LAB — CBC WITH DIFFERENTIAL (CANCER CENTER ONLY)
Abs Immature Granulocytes: 0.02 10*3/uL (ref 0.00–0.07)
Basophils Absolute: 0.1 10*3/uL (ref 0.0–0.1)
Basophils Relative: 1 %
Eosinophils Absolute: 0.3 10*3/uL (ref 0.0–0.5)
Eosinophils Relative: 3 %
HCT: 39.7 % (ref 39.0–52.0)
Hemoglobin: 13.1 g/dL (ref 13.0–17.0)
Immature Granulocytes: 0 %
Lymphocytes Relative: 20 %
Lymphs Abs: 1.7 10*3/uL (ref 0.7–4.0)
MCH: 31.6 pg (ref 26.0–34.0)
MCHC: 33 g/dL (ref 30.0–36.0)
MCV: 95.7 fL (ref 80.0–100.0)
Monocytes Absolute: 1.4 10*3/uL — ABNORMAL HIGH (ref 0.1–1.0)
Monocytes Relative: 17 %
Neutro Abs: 5 10*3/uL (ref 1.7–7.7)
Neutrophils Relative %: 59 %
Platelet Count: 385 10*3/uL (ref 150–400)
RBC: 4.15 MIL/uL — ABNORMAL LOW (ref 4.22–5.81)
RDW: 13.2 % (ref 11.5–15.5)
WBC Count: 8.5 10*3/uL (ref 4.0–10.5)
nRBC: 0 % (ref 0.0–0.2)

## 2021-09-10 LAB — LACTATE DEHYDROGENASE: LDH: 143 U/L (ref 98–192)

## 2021-09-10 LAB — PREALBUMIN: Prealbumin: 19.8 mg/dL (ref 18–38)

## 2021-09-10 MED ORDER — HEPARIN SOD (PORK) LOCK FLUSH 100 UNIT/ML IV SOLN
500.0000 [IU] | Freq: Once | INTRAVENOUS | Status: AC
  Administered 2021-09-10: 500 [IU] via INTRAVENOUS

## 2021-09-10 MED ORDER — SODIUM CHLORIDE 0.9% FLUSH
10.0000 mL | INTRAVENOUS | Status: DC | PRN
  Administered 2021-09-10: 10 mL via INTRAVENOUS

## 2021-09-10 NOTE — Progress Notes (Signed)
Visited with patient in treatment. He would like to get labs done today and every 6-8 weeks with port flushes. Asked him about following up with Dr Marin Olp and he declines at this time. He doesn't want any treatment and feels fine right now. He is also not following up with any of his [place]. Spoke to Dr Marin Olp and he's fine with this plan.   Oncology Nurse Navigator Documentation  Oncology Nurse Navigator Flowsheets 09/10/2021  Navigator Follow Up Date: -  Navigator Follow Up Reason: -  Navigator Location [facility]  Navigator Encounter Type Treatment  Telephone -  Patient Visit Type MedOnc  Treatment Phase Active Tx  Barriers/Navigation Needs Coordination of Care;Education  Education -  Interventions Coordination of Care;Psycho-Social Support  Acuity Level 2-Minimal Needs (1-2 Barriers Identified)  Coordination of Care Appts  Education Method Verbal  Support Groups/Services Friends and Family  Time Spent with Patient 30

## 2021-09-11 LAB — CANCER ANTIGEN 19-9: CA 19-9: 5 U/mL (ref 0–35)

## 2021-10-11 ENCOUNTER — Telehealth: Payer: Self-pay | Admitting: Family Medicine

## 2021-10-11 NOTE — Telephone Encounter (Signed)
Left message for wife to return call.  What does he need to be seen for? Regular check?  Pt has an appt on 10/26

## 2021-10-13 ENCOUNTER — Ambulatory Visit (INDEPENDENT_AMBULATORY_CARE_PROVIDER_SITE_OTHER): Payer: Medicare Other | Admitting: Family Medicine

## 2021-10-13 ENCOUNTER — Ambulatory Visit (INDEPENDENT_AMBULATORY_CARE_PROVIDER_SITE_OTHER): Payer: Medicare Other

## 2021-10-13 ENCOUNTER — Encounter: Payer: Self-pay | Admitting: Family Medicine

## 2021-10-13 ENCOUNTER — Other Ambulatory Visit: Payer: Self-pay

## 2021-10-13 VITALS — BP 165/99 | HR 72 | Ht 71.0 in | Wt 152.0 lb

## 2021-10-13 DIAGNOSIS — N19 Unspecified kidney failure: Secondary | ICD-10-CM

## 2021-10-13 DIAGNOSIS — M7989 Other specified soft tissue disorders: Secondary | ICD-10-CM | POA: Diagnosis not present

## 2021-10-13 DIAGNOSIS — N138 Other obstructive and reflux uropathy: Secondary | ICD-10-CM

## 2021-10-13 DIAGNOSIS — K56699 Other intestinal obstruction unspecified as to partial versus complete obstruction: Secondary | ICD-10-CM

## 2021-10-13 DIAGNOSIS — K56609 Unspecified intestinal obstruction, unspecified as to partial versus complete obstruction: Secondary | ICD-10-CM | POA: Diagnosis not present

## 2021-10-13 DIAGNOSIS — N200 Calculus of kidney: Secondary | ICD-10-CM

## 2021-10-13 DIAGNOSIS — D72829 Elevated white blood cell count, unspecified: Secondary | ICD-10-CM

## 2021-10-13 NOTE — Progress Notes (Signed)
BP (!) 165/99   Pulse 72   Ht 5' 11"  (1.803 m)   Wt 152 lb (68.9 kg)   SpO2 97%   BMI 21.20 kg/m    Subjective:   Patient ID: Kevin Villegas, male    DOB: [birth year], 80 y.o.   MRN: [id number]  HPI: Kevin Villegas is a 80 y.o. male presenting on 10/13/2021 for Prostate Cancer (History of since 2018) and Colostomy (S/P biospy of growth in pelvis- not sure what growth is)   HPI Patient is coming in today for hospital follow-up.  He said he had an obstruction in his bowels and they tried to place a stent in his bowels which failed and then they had to remove the stent.  He says he is feeling good but he has noticed that he gets the occasional flank tenderness but then it passes quickly.  He is also noticed that since the surgery he has these 2 hard firm nodules in the right lower abdomen, 1 on each side.  He was found to have previously his tenia especially on the right side concern for some blockage there as well.  Relevant past medical, surgical, family and social history reviewed and updated as indicated. Interim medical history since our last visit reviewed. Allergies and medications reviewed and updated.  Review of Systems  Constitutional:  Negative for chills and fever.  Respiratory:  Negative for shortness of breath and wheezing.   Cardiovascular:  Negative for chest pain and leg swelling.  Gastrointestinal:  Negative for abdominal pain, blood in stool and constipation.  Musculoskeletal:  Negative for back pain and gait problem.  Skin:  Negative for rash.  All other systems reviewed and are negative.  Per HPI unless specifically indicated above   Allergies as of 10/13/2021       Reactions   Pollen Extract Other (See Comments)   Sinus congestion        Medication List        Accurate as of October 13, 2021  3:37 PM. If you have any questions, ask your nurse or doctor.          oxyCODONE 5 MG immediate release tablet Commonly known as: Oxy IR/ROXICODONE Take  1 tablet (5 mg total) by mouth every 6 (six) hours as needed for breakthrough pain.         Objective:   BP (!) 165/99   Pulse 72   Ht 5' 11"  (1.803 m)   Wt 152 lb (68.9 kg)   SpO2 97%   BMI 21.20 kg/m   Wt Readings from Last 3 Encounters:  10/13/21 152 lb (68.9 kg)  07/05/21 143 lb 1.9 oz (64.9 kg)  06/14/21 147 lb 11.3 oz (67 kg)    Physical Exam Vitals and nursing note reviewed.  Constitutional:      General: He is not in acute distress.    Appearance: He is well-developed. He is not diaphoretic.  Eyes:     General: No scleral icterus.    Conjunctiva/sclera: Conjunctivae normal.  Neck:     Thyroid: No thyromegaly.  Cardiovascular:     Rate and Rhythm: Normal rate and regular rhythm.     Heart sounds: Normal heart sounds. No murmur heard. Pulmonary:     Effort: Pulmonary effort is normal. No respiratory distress.     Breath sounds: Normal breath sounds. No wheezing.  Abdominal:     General: Abdomen is flat. Bowel sounds are normal. There is no distension.     Palpations:  Abdomen is soft.     Tenderness: There is no abdominal tenderness. There is no guarding or rebound.     Comments: Firm palpable mobile nodule in the superficial soft tissue of the right lower abdomen and right upper abdomen  Colostomy bag in place  Musculoskeletal:        General: Normal range of motion.     Cervical back: Neck supple.  Lymphadenopathy:     Cervical: No cervical adenopathy.  Skin:    General: Skin is warm and dry.     Findings: No rash.  Neurological:     Mental Status: He is alert and oriented to person, place, and time.     Coordination: Coordination normal.  Psychiatric:        Behavior: Behavior normal.    Abdominal x-ray: Does not show anything associated with the nodule, await final read from radiology   Assessment & Plan:   Problem List Items Addressed This Visit       Digestive   Colon stricture ([place])   Large bowel obstruction ([facility]) secondary to colonic  stricture     Other   Acute prerenal azotemia - Primary   Relevant Orders   CBC with Differential/Platelet   CMP14+EGFR   Leukocytosis   Other Visit Diagnoses     Soft tissue mass       Relevant Orders   DG Abd 2 Views       We will recheck kidney function, patient continues to follow-up with gastroenterology.  Biopsy came back as noncancerous but they still do not know the source of what is because his obstruction.  Patient has 8 mm obstructing renal stone on the right side we will place urgent urology referral.  He says he is going out of tomorrow will be back on the seventh so set up appointment for 7th. Follow up plan: Return if symptoms worsen or fail to improve.  Counseling provided for all of the vaccine components Orders Placed This Encounter  Procedures   DG Abd 2 Views   CBC with Differential/Platelet   CMP14+EGFR    Caryl Pina, MD [facility] 10/13/2021, 3:37 PM

## 2021-10-14 ENCOUNTER — Encounter: Payer: Self-pay | Admitting: Hematology & Oncology

## 2021-10-14 LAB — CBC WITH DIFFERENTIAL/PLATELET
Basophils Absolute: 0.1 10*3/uL (ref 0.0–0.2)
Basos: 1 %
EOS (ABSOLUTE): 0.3 10*3/uL (ref 0.0–0.4)
Eos: 4 %
Hematocrit: 42.8 % (ref 37.5–51.0)
Hemoglobin: 14.1 g/dL (ref 13.0–17.7)
Immature Grans (Abs): 0 10*3/uL (ref 0.0–0.1)
Immature Granulocytes: 0 %
Lymphocytes Absolute: 1.7 10*3/uL (ref 0.7–3.1)
Lymphs: 20 %
MCH: 29.8 pg (ref 26.6–33.0)
MCHC: 32.9 g/dL (ref 31.5–35.7)
MCV: 91 fL (ref 79–97)
Monocytes Absolute: 1.1 10*3/uL — ABNORMAL HIGH (ref 0.1–0.9)
Monocytes: 13 %
Neutrophils Absolute: 5.6 10*3/uL (ref 1.4–7.0)
Neutrophils: 62 %
Platelets: 481 10*3/uL — ABNORMAL HIGH (ref 150–450)
RBC: 4.73 x10E6/uL (ref 4.14–5.80)
RDW: 11.8 % (ref 11.6–15.4)
WBC: 8.8 10*3/uL (ref 3.4–10.8)

## 2021-10-14 LAB — CMP14+EGFR
ALT: 19 IU/L (ref 0–44)
AST: 25 IU/L (ref 0–40)
Albumin/Globulin Ratio: 1.4 (ref 1.2–2.2)
Albumin: 4.3 g/dL (ref 3.7–4.7)
Alkaline Phosphatase: 74 IU/L (ref 44–121)
BUN/Creatinine Ratio: 14 (ref 10–24)
BUN: 25 mg/dL (ref 8–27)
Bilirubin Total: <0.2 mg/dL (ref 0.0–1.2)
CO2: 26 mmol/L (ref 20–29)
Calcium: 9.5 mg/dL (ref 8.6–10.2)
Chloride: 101 mmol/L (ref 96–106)
Creatinine, Ser: 1.84 mg/dL — ABNORMAL HIGH (ref 0.76–1.27)
Globulin, Total: 3.1 g/dL (ref 1.5–4.5)
Glucose: 92 mg/dL (ref 70–99)
Potassium: 5.2 mmol/L (ref 3.5–5.2)
Sodium: 139 mmol/L (ref 134–144)
Total Protein: 7.4 g/dL (ref 6.0–8.5)
eGFR: 37 mL/min/{1.73_m2} — ABNORMAL LOW (ref >59)

## 2021-10-27 ENCOUNTER — Emergency Department (HOSPITAL_BASED_OUTPATIENT_CLINIC_OR_DEPARTMENT_OTHER): Payer: Medicare Other

## 2021-10-27 ENCOUNTER — Encounter (HOSPITAL_BASED_OUTPATIENT_CLINIC_OR_DEPARTMENT_OTHER): Payer: Self-pay | Admitting: *Deleted

## 2021-10-27 ENCOUNTER — Inpatient Hospital Stay (HOSPITAL_BASED_OUTPATIENT_CLINIC_OR_DEPARTMENT_OTHER)
Admission: EM | Admit: 2021-10-27 | Discharge: 2021-10-30 | DRG: 660 | Disposition: A | Discharge status: Home / Self Care | Payer: Medicare Other | Attending: Family Medicine | Admitting: Family Medicine

## 2021-10-27 ENCOUNTER — Encounter (HOSPITAL_COMMUNITY)
Admission: EM | Disposition: A | Discharge status: Home / Self Care | Payer: Self-pay | Source: Home / Self Care | Attending: Family Medicine

## 2021-10-27 ENCOUNTER — Emergency Department (HOSPITAL_COMMUNITY): Payer: Medicare Other | Admitting: Anesthesiology

## 2021-10-27 ENCOUNTER — Telehealth: Payer: Self-pay | Admitting: Family Medicine

## 2021-10-27 ENCOUNTER — Other Ambulatory Visit: Payer: Self-pay

## 2021-10-27 ENCOUNTER — Observation Stay (HOSPITAL_COMMUNITY): Payer: Medicare Other

## 2021-10-27 DIAGNOSIS — Z9081 Acquired absence of spleen: Secondary | ICD-10-CM | POA: Diagnosis not present

## 2021-10-27 DIAGNOSIS — R34 Anuria and oliguria: Secondary | ICD-10-CM | POA: Diagnosis not present

## 2021-10-27 DIAGNOSIS — N189 Chronic kidney disease, unspecified: Secondary | ICD-10-CM

## 2021-10-27 DIAGNOSIS — I129 Hypertensive chronic kidney disease with stage 1 through stage 4 chronic kidney disease, or unspecified chronic kidney disease: Secondary | ICD-10-CM | POA: Diagnosis present

## 2021-10-27 DIAGNOSIS — Z9109 Other allergy status, other than to drugs and biological substances: Secondary | ICD-10-CM

## 2021-10-27 DIAGNOSIS — C799 Secondary malignant neoplasm of unspecified site: Secondary | ICD-10-CM | POA: Diagnosis present

## 2021-10-27 DIAGNOSIS — J90 Pleural effusion, not elsewhere classified: Secondary | ICD-10-CM | POA: Diagnosis present

## 2021-10-27 DIAGNOSIS — Z903 Acquired absence of stomach [part of]: Secondary | ICD-10-CM

## 2021-10-27 DIAGNOSIS — Z8507 Personal history of malignant neoplasm of pancreas: Secondary | ICD-10-CM

## 2021-10-27 DIAGNOSIS — E875 Hyperkalemia: Secondary | ICD-10-CM

## 2021-10-27 DIAGNOSIS — Z20822 Contact with and (suspected) exposure to covid-19: Secondary | ICD-10-CM | POA: Diagnosis present

## 2021-10-27 DIAGNOSIS — N131 Hydronephrosis with ureteral stricture, not elsewhere classified: Secondary | ICD-10-CM | POA: Diagnosis not present

## 2021-10-27 DIAGNOSIS — Z933 Colostomy status: Secondary | ICD-10-CM | POA: Diagnosis not present

## 2021-10-27 DIAGNOSIS — R339 Retention of urine, unspecified: Secondary | ICD-10-CM | POA: Diagnosis present

## 2021-10-27 DIAGNOSIS — I10 Essential (primary) hypertension: Secondary | ICD-10-CM

## 2021-10-27 DIAGNOSIS — N1330 Unspecified hydronephrosis: Secondary | ICD-10-CM

## 2021-10-27 DIAGNOSIS — N1831 Chronic kidney disease, stage 3a: Secondary | ICD-10-CM | POA: Diagnosis not present

## 2021-10-27 DIAGNOSIS — K5730 Diverticulosis of large intestine without perforation or abscess without bleeding: Secondary | ICD-10-CM | POA: Diagnosis not present

## 2021-10-27 DIAGNOSIS — Z90411 Acquired partial absence of pancreas: Secondary | ICD-10-CM | POA: Diagnosis not present

## 2021-10-27 DIAGNOSIS — N202 Calculus of kidney with calculus of ureter: Secondary | ICD-10-CM | POA: Diagnosis present

## 2021-10-27 DIAGNOSIS — E872 Acidosis, unspecified: Secondary | ICD-10-CM | POA: Diagnosis not present

## 2021-10-27 DIAGNOSIS — N179 Acute kidney failure, unspecified: Secondary | ICD-10-CM

## 2021-10-27 DIAGNOSIS — N201 Calculus of ureter: Secondary | ICD-10-CM | POA: Diagnosis present

## 2021-10-27 DIAGNOSIS — N135 Crossing vessel and stricture of ureter without hydronephrosis: Secondary | ICD-10-CM | POA: Diagnosis present

## 2021-10-27 HISTORY — PX: CYSTOSCOPY W/ URETERAL STENT PLACEMENT: SHX1429

## 2021-10-27 HISTORY — DX: Colostomy status: Z93.3

## 2021-10-27 LAB — CBC WITH DIFFERENTIAL/PLATELET
Abs Immature Granulocytes: 0.03 10*3/uL (ref 0.00–0.07)
Basophils Absolute: 0 10*3/uL (ref 0.0–0.1)
Basophils Relative: 0 %
Eosinophils Absolute: 0.1 10*3/uL (ref 0.0–0.5)
Eosinophils Relative: 1 %
HCT: 38.1 % — ABNORMAL LOW (ref 39.0–52.0)
Hemoglobin: 12.6 g/dL — ABNORMAL LOW (ref 13.0–17.0)
Immature Granulocytes: 0 %
Lymphocytes Relative: 9 %
Lymphs Abs: 0.8 10*3/uL (ref 0.7–4.0)
MCH: 30.1 pg (ref 26.0–34.0)
MCHC: 33.1 g/dL (ref 30.0–36.0)
MCV: 91.1 fL (ref 80.0–100.0)
Monocytes Absolute: 1.2 10*3/uL — ABNORMAL HIGH (ref 0.1–1.0)
Monocytes Relative: 13 %
Neutro Abs: 7 10*3/uL (ref 1.7–7.7)
Neutrophils Relative %: 77 %
Platelets: 529 10*3/uL — ABNORMAL HIGH (ref 150–400)
RBC: 4.18 MIL/uL — ABNORMAL LOW (ref 4.22–5.81)
RDW: 13.1 % (ref 11.5–15.5)
WBC: 9.2 10*3/uL (ref 4.0–10.5)
nRBC: 0 % (ref 0.0–0.2)

## 2021-10-27 LAB — COMPREHENSIVE METABOLIC PANEL
ALT: 16 U/L (ref 0–44)
AST: 13 U/L — ABNORMAL LOW (ref 15–41)
Albumin: 3.1 g/dL — ABNORMAL LOW (ref 3.5–5.0)
Alkaline Phosphatase: 50 U/L (ref 38–126)
Anion gap: 16 — ABNORMAL HIGH (ref 5–15)
BUN: 93 mg/dL — ABNORMAL HIGH (ref 8–23)
CO2: 17 mmol/L — ABNORMAL LOW (ref 22–32)
Calcium: 8.3 mg/dL — ABNORMAL LOW (ref 8.9–10.3)
Chloride: 100 mmol/L (ref 98–111)
Creatinine, Ser: 12.2 mg/dL — ABNORMAL HIGH (ref 0.61–1.24)
GFR, Estimated: 4 mL/min — ABNORMAL LOW (ref >60)
Glucose, Bld: 110 mg/dL — ABNORMAL HIGH (ref 70–99)
Potassium: 4.8 mmol/L (ref 3.5–5.1)
Sodium: 133 mmol/L — ABNORMAL LOW (ref 135–145)
Total Bilirubin: 0.3 mg/dL (ref 0.3–1.2)
Total Protein: 7 g/dL (ref 6.5–8.1)

## 2021-10-27 LAB — RESP PANEL BY RT-PCR (RSV, FLU A&B, COVID)  RVPGX2
Influenza A by PCR: NEGATIVE
Influenza B by PCR: NEGATIVE
Resp Syncytial Virus by PCR: NEGATIVE
SARS Coronavirus 2 by RT PCR: NEGATIVE

## 2021-10-27 LAB — RESP PANEL BY RT-PCR (FLU A&B, COVID) ARPGX2
Influenza A by PCR: NEGATIVE
Influenza B by PCR: NEGATIVE
SARS Coronavirus 2 by RT PCR: NEGATIVE

## 2021-10-27 SURGERY — CYSTOSCOPY, WITH RETROGRADE PYELOGRAM AND URETERAL STENT INSERTION
Anesthesia: General | Site: Ureter | Laterality: Left

## 2021-10-27 MED ORDER — DEXAMETHASONE SODIUM PHOSPHATE 10 MG/ML IJ SOLN
INTRAMUSCULAR | Status: DC | PRN
  Administered 2021-10-27: 10 mg via INTRAVENOUS

## 2021-10-27 MED ORDER — LIDOCAINE 2% (20 MG/ML) 5 ML SYRINGE
INTRAMUSCULAR | Status: DC | PRN
Start: 2021-10-27 — End: 2021-10-27
  Administered 2021-10-27: 60 mg via INTRAVENOUS

## 2021-10-27 MED ORDER — ACETAMINOPHEN 10 MG/ML IV SOLN: 1000.0000 mg | Freq: Once | INTRAVENOUS | Status: DC | PRN

## 2021-10-27 MED ORDER — IOHEXOL 300 MG/ML  SOLN
INTRAMUSCULAR | Status: DC | PRN
  Administered 2021-10-27: 10 mL

## 2021-10-27 MED ORDER — ACETAMINOPHEN 325 MG PO TABS
650.0000 mg | ORAL_TABLET | Freq: Four times a day (QID) | ORAL | Status: DC | PRN
  Administered 2021-10-28 (×2): 650 mg via ORAL
  Filled 2021-10-27 (×2): qty 2

## 2021-10-27 MED ORDER — CEFAZOLIN SODIUM-DEXTROSE 2-4 GM/100ML-% IV SOLN
INTRAVENOUS | Status: AC
  Filled 2021-10-27: qty 100

## 2021-10-27 MED ORDER — ONDANSETRON HCL 4 MG/2ML IJ SOLN: 4.0000 mg | Freq: Once | INTRAMUSCULAR | Status: DC | PRN

## 2021-10-27 MED ORDER — ONDANSETRON HCL 4 MG PO TABS
4.0000 mg | ORAL_TABLET | Freq: Four times a day (QID) | ORAL | Status: DC | PRN
  Filled 2021-10-27: qty 1

## 2021-10-27 MED ORDER — STERILE WATER FOR IRRIGATION IR SOLN
Status: DC | PRN
  Administered 2021-10-27: 3000 mL

## 2021-10-27 MED ORDER — ONDANSETRON HCL 4 MG/2ML IJ SOLN
INTRAMUSCULAR | Status: DC | PRN
  Administered 2021-10-27: 4 mg via INTRAVENOUS

## 2021-10-27 MED ORDER — PROPOFOL 10 MG/ML IV BOLUS
INTRAVENOUS | Status: DC | PRN
  Administered 2021-10-27: 200 mg via INTRAVENOUS

## 2021-10-27 MED ORDER — FENTANYL CITRATE (PF) 100 MCG/2ML IJ SOLN
INTRAMUSCULAR | Status: AC
  Filled 2021-10-27: qty 2

## 2021-10-27 MED ORDER — SENNOSIDES-DOCUSATE SODIUM 8.6-50 MG PO TABS
1.0000 | ORAL_TABLET | Freq: Every evening | ORAL | Status: DC | PRN
  Administered 2021-10-29: 1 via ORAL
  Filled 2021-10-27 (×2): qty 1

## 2021-10-27 MED ORDER — HYDRALAZINE HCL 20 MG/ML IJ SOLN
5.0000 mg | INTRAMUSCULAR | Status: AC | PRN
  Administered 2021-10-27 – 2021-10-28 (×4): 5 mg via INTRAVENOUS

## 2021-10-27 MED ORDER — ONDANSETRON HCL 4 MG/2ML IJ SOLN: 4.0000 mg | Freq: Four times a day (QID) | INTRAMUSCULAR | Status: DC | PRN

## 2021-10-27 MED ORDER — FENTANYL CITRATE (PF) 100 MCG/2ML IJ SOLN
INTRAMUSCULAR | Status: DC | PRN
  Administered 2021-10-27 (×2): 50 ug via INTRAVENOUS

## 2021-10-27 MED ORDER — SODIUM CHLORIDE 0.9 % IV BOLUS
1000.0000 mL | Freq: Once | INTRAVENOUS | Status: AC
  Administered 2021-10-27: 1000 mL via INTRAVENOUS

## 2021-10-27 MED ORDER — HEPARIN SODIUM (PORCINE) 5000 UNIT/ML IJ SOLN
5000.0000 [IU] | Freq: Three times a day (TID) | INTRAMUSCULAR | Status: DC
  Filled 2021-10-27 (×2): qty 1

## 2021-10-27 MED ORDER — HYDRALAZINE HCL 20 MG/ML IJ SOLN
INTRAMUSCULAR | Status: AC
  Filled 2021-10-27: qty 1

## 2021-10-27 MED ORDER — FENTANYL CITRATE PF 50 MCG/ML IJ SOSY: 25.0000 ug | PREFILLED_SYRINGE | INTRAMUSCULAR | Status: DC | PRN

## 2021-10-27 MED ORDER — LACTATED RINGERS IV SOLN: INTRAVENOUS | Status: DC | PRN

## 2021-10-27 MED ORDER — ACETAMINOPHEN 650 MG RE SUPP
650.0000 mg | Freq: Four times a day (QID) | RECTAL | Status: DC | PRN
  Filled 2021-10-27: qty 1

## 2021-10-27 SURGICAL SUPPLY — 13 items
BAG URO CATCHER STRL LF (MISCELLANEOUS) ×2 IMPLANT
CATH URETL OPEN END 6FR 70 (CATHETERS) IMPLANT
CLOTH BEACON ORANGE TIMEOUT ST (SAFETY) ×2 IMPLANT
GLOVE SURG ENC TEXT LTX SZ7.5 (GLOVE) ×2 IMPLANT
GOWN STRL REUS W/TWL LRG LVL3 (GOWN DISPOSABLE) ×4 IMPLANT
GUIDEWIRE STR DUAL SENSOR (WIRE) ×2 IMPLANT
GUIDEWIRE ZIPWRE .038 STRAIGHT (WIRE) IMPLANT
KIT TURNOVER KIT A (KITS) IMPLANT
MANIFOLD NEPTUNE II (INSTRUMENTS) ×2 IMPLANT
PACK CYSTO (CUSTOM PROCEDURE TRAY) ×2 IMPLANT
STENT CONTOUR 6FRX26X.038 (STENTS) ×2 IMPLANT
TUBING CONNECTING 10 (TUBING) ×2 IMPLANT
TUBING UROLOGY SET (TUBING) IMPLANT

## 2021-10-27 NOTE — Anesthesia Preprocedure Evaluation (Addendum)
Anesthesia Evaluation  Patient identified by MRN, date of birth, ID band Patient awake    Reviewed: Allergy & Precautions, NPO status , Patient's Chart, lab work & pertinent test results  Airway Mallampati: II  TM Distance: >3 FB Neck ROM: Full    Dental no notable dental hx.    Pulmonary neg pulmonary ROS,    Pulmonary exam normal breath sounds clear to auscultation       Cardiovascular negative cardio ROS Normal cardiovascular exam Rhythm:Regular Rate:Normal  ECG: SR, rate 73   Neuro/Psych negative neurological ROS  negative psych ROS   GI/Hepatic negative GI ROS, Neg liver ROS,   Endo/Other  Pancreatic cancer   Renal/GU ARFRenal disease     Musculoskeletal negative musculoskeletal ROS (+)   Abdominal   Peds  Hematology  (+) anemia ,   Anesthesia Other Findings ACUTE RENAL FAILURE BILATERAL RENAL OBSTRUCTION  Reproductive/Obstetrics                            Anesthesia Physical Anesthesia Plan  ASA: 3 and emergent  Anesthesia Plan: General   Post-op Pain Management:    Induction: Intravenous  PONV Risk Score and Plan: 3 and Ondansetron, Dexamethasone and Treatment may vary due to age or medical condition  Airway Management Planned: LMA  Additional Equipment:   Intra-op Plan:   Post-operative Plan: Extubation in OR  Informed Consent: I have reviewed the patients History and Physical, chart, labs and discussed the procedure including the risks, benefits and alternatives for the proposed anesthesia with the patient or authorized representative who has indicated his/her understanding and acceptance.     Dental advisory given  Plan Discussed with: CRNA  Anesthesia Plan Comments:        Anesthesia Quick Evaluation

## 2021-10-27 NOTE — Op Note (Signed)
Preoperative diagnosis: 1.  Right ureteral calculus 2.  Bilateral ureteral obstruction 3.  Acute renal failure  Postoperative diagnosis:  1.  Right ureteral calculus 2.  Bilateral ureteral obstruction 3.  Acute renal failure  Procedures: 1.  Cystoscopy 2.  Bilateral retrograde pyelography with interpretation 3.  Left ureteral stent placement (6 x 26-no string)  Surgeon: Pryor Curia MD  Anesthesia: General  Complications: None  EBL: Minimal  Specimens: None  Intraoperative findings: Left retrograde pyelography was performed with a 5 French ureteral catheter and Omnipaque contrast.  This demonstrated a mildly dilated distal left ureter with significant narrowing of the mid ureter with an apple core defect measuring approximately 3 cm concerning for ureteral stricture versus extrinsic obstruction.  Right retrograde pyelography was performed in a similar fashion.  This demonstrated a dilated distal ureter up to a point of complete obstruction approximately 3 cm above the right ureterovesical junction.  Indication: [name] is a 80 year old gentleman who was recently evaluated by Dr. Louis Meckel in the office.  He was noted to have an incidentally detected very large 2 cm right distal ureteral calculus with evidence of chronic obstruction and an atrophic right kidney.  On prior imaging, he did not have evidence of left ureteral obstruction when evaluated in July 2022.  However, a CT stone study yesterday demonstrated new onset of left hydronephrosis.  At the time, he was asymptomatic and was scheduled for elective surgical evaluation.  However, he developed and anuria and was found to be in acute renal failure earlier today.  He presented to the emergency department and his serum creatinine was over 12.  It was therefore recommended that he proceed with urgent renal drainage.  The above procedures were discussed in detail and informed consent was obtained after reviewing the potential  risks, complications, and expected recovery process.  He gave informed consent.  Description of procedure: The patient was taken the operating room and a general anesthetic was administered.  He was given preoperative antibiotics, placed in the dorsolithotomy position, and prepped and draped in the usual sterile fashion.  Next, a preoperative timeout was performed.  Cystourethroscopy was then performed with a 22 French cystoscope sheath.  This revealed a normal anterior and posterior urethra.  Inspection of the bladder revealed significant edematous change with evidence of chronic inflammation versus possible tumor growth in the vicinity of the distal right ureter and right hemitrigone.  There were no bladder tumors or other abnormalities noted aside from the right hemitrigone.  The left ureteral orifice was identified and a 5 [place] ureteral catheter was used to cannulate it.  Omnipaque contrast was injected with findings as dictated above concerning for a mid left ureteral stricture versus extrinsic obstruction.  A 0.38 sensor guidewire was able to be easily advanced past the level of obstruction up into the left renal pelvis without difficulty.  A 6 x 26 double-J ureteral stent was then passed over the wire using Seldinger technique and positioned appropriately under fluoroscopic and cystoscopic guidance.  The wire was removed with a good curl noted the renal pelvis as well as within the bladder.  Attention then turned to the right hemitrigone.  It was quite difficult to visualize the right ureteral orifice.  However, after multiple attempts at probing the right hemitrigone, I was able to identify the right ureteral orifice.  I advanced a 0.38 sensor guidewire although met resistance fairly quickly within the distal ureter.  I advanced the ureteral catheter slightly and injected Omnipaque contrast.  The distal ureter was noted to be dilated but appeared to be completely obstructed with no contrast seen above  the level of the distal ureter approximately 3 to 4 cm above the right UVJ.  After multiple attempts to see if I could get a wire to pass this level of obstruction, it was deemed that this would likely not be possible that was likely related to what was most likely a chronically impacted distal right ureteral calculus.  Considering the fact that he is acute renal failure is almost certainly due to his left ureteral obstruction, procedure was ended.  He tolerated the procedure well without complications.  He will be admitted to the hospitalist service to further monitor and assess his renal function following left ureteral stent drainage.  He was transferred to the recovery room in satisfactory condition.

## 2021-10-27 NOTE — ED Notes (Signed)
Report given to Tawni Millers nurse at [facility]

## 2021-10-27 NOTE — Anesthesia Procedure Notes (Signed)
Procedure Name: LMA Insertion Date/Time: 10/27/2021 10:12 PM Performed by: Gerald Leitz, CRNA Pre-anesthesia Checklist: Patient identified, Patient being monitored, Timeout performed, Emergency Drugs available and Suction available Patient Re-evaluated:Patient Re-evaluated prior to induction Oxygen Delivery Method: Circle system utilized Preoxygenation: Pre-oxygenation with 100% oxygen Induction Type: IV induction Ventilation: Mask ventilation without difficulty LMA: LMA inserted LMA Size: 4.0 Tube type: Oral Number of attempts: 1 Placement Confirmation: positive ETCO2 and breath sounds checked- equal and bilateral Tube secured with: Tape Dental Injury: Teeth and Oropharynx as per pre-operative assessment

## 2021-10-27 NOTE — ED Notes (Signed)
Pt can't pass any urine. Dr Darl Householder is aware.

## 2021-10-27 NOTE — Telephone Encounter (Signed)
I believe he has an urology appt tomorrow?

## 2021-10-27 NOTE — ED Triage Notes (Addendum)
C/o urinary retention  x 24 hrs

## 2021-10-27 NOTE — H&P (Signed)
History and Physical    Kevin Villegas YQI:[id number] DOB: [birth year] DOA: 10/27/2021  PCP: Dettinger, [name], MD  Patient coming from: [place] [facility]   I have personally briefly reviewed patient's old medical records in [place]  Chief Complaint: Urinary retention  HPI: Kevin Villegas is a 80 y.o. male with medical history significant for metastatic pancreatic cancer complicated by distal bowel obstruction s/p diverting colectomy, CKD stage IIIa who presented to the ED for evaluation of urinary retention.  Saw his urologist on 11/8.  CT urogram was seen and showed a stable 6 mm distal right ureteral calculus with severe right hydroureteronephrosis.  Increased moderate to severe left hydronephrosis was seen without ureteral calculi or other obstructing etiology apparent on exam.  Stable ill-defined soft tissue mass in area of pancreatic body abutting the duodenal stent, stable bibasilar pulmonary nodules, new small left pleural effusion, colon diverticulosis, stable mildly large prostate all noted.  Patient presented to the ED for evaluation of urinary retention over the last 24 hours.  He otherwise has not been having any abdominal pain, nausea, vomiting.  He reports good output from his colostomy.  Patient seen in PACU after left ureteral stent placed.  He is having urinary urgency but no urine output yet.  He states that he is not currently taking any medications at home including NSAIDs.  [facility] ED Course:  Initial vitals showed BP 163/104, pulse 74, RR 16, temp 97.9 F, SPO2 100% on room air.  Labs showed BUN 93, creatinine 12.20 (baseline ~1.4-1.5), GFR 4, sodium 133, potassium 4.8, bicarb 17, serum glucose 110, AST 13, ALT 16, alkaline phosphatase 50, total bilirubin 0.3, WBC 9.2, hemoglobin 12.6, platelets 529,000.  SARS-CoV-2 and influenza PCR's are negative.  UA and urine culture ordered and pending collection.  Renal ultrasound showed moderate  right and mild left hydronephrosis.  Decompressed and not not well visualized urinary bladder.  EDP discussed with on-call urology, Dr. Alinda Money, who recommended ED to ED transfer with plan for stenting versus nephrostomy tube placement.  Patient received 1 L normal saline in the ED.  The hospitalist service was consulted to admit on arrival.  Review of Systems: All systems reviewed and are negative except as documented in history of present illness above.   Past Medical History:  Diagnosis Date   Colostomy present [facility])    Large bowel obstruction ([place])    Pancreatic cancer [facility])     Past Surgical History:  Procedure Laterality Date   ABDOMINAL SURGERY     BIOPSY  05/20/2021   Procedure: BIOPSY;  Surgeon: Otis Brace, MD;  [facility];  Service: Gastroenterology;;   BOWEL RESECTION     BOWEL RESECTION     CHOLECYSTECTOMY N/A 12/16/2014   Procedure: LAPAROSCOPIC CHOLECYSTECTOMY WITH INTRAOPERATIVE CHOLANGIOGRAM;  Surgeon: Georganna Skeans, MD;  Location: [place];  Service: General;  Laterality: N/A;   COLONIC STENT PLACEMENT N/A 05/26/2021   Procedure: COLONIC STENT PLACEMENT;  Surgeon: Clarene Essex, MD;  [facility];  Service: Endoscopy;  Laterality: N/A;   ESOPHAGOGASTRODUODENOSCOPY N/A 05/20/2021   Procedure: ESOPHAGOGASTRODUODENOSCOPY (EGD);  Surgeon: Otis Brace, MD;  [facility];  Service: Gastroenterology;  Laterality: N/A;   EUS N/A 09/24/2015   Procedure: UPPER ENDOSCOPIC ULTRASOUND (EUS) LINEAR;  Surgeon: Milus Banister, MD;  [facility];  Service: Endoscopy;  Laterality: N/A;   FLEXIBLE SIGMOIDOSCOPY N/A 05/20/2021   Procedure: FLEXIBLE SIGMOIDOSCOPY;  Surgeon: Otis Brace, MD;  [facility];  Service:  Gastroenterology;  Laterality: N/A;   FLEXIBLE SIGMOIDOSCOPY N/A 05/26/2021   Procedure: FLEXIBLE SIGMOIDOSCOPY;  Surgeon: Clarene Essex, MD;  [facility];  Service: Endoscopy;  Laterality: N/A;    LAPAROSCOPIC DISTAL PANCREATECTOMY  10/2017   [facility]   LAPAROSCOPIC PARTIAL GASTRECTOMY  10/2017   [facility]   LAPAROSCOPIC SIGMOID COLECTOMY N/A 06/14/2021   Procedure: LAPAROSCOPY WITH MOBILIZATION, PROXIMAL DIVERTING COLOSTOMY, CREATION OF DISTAL MUCOUS FISTULA;  Surgeon: Johnathan Hausen, MD;  [facility];  Service: General;  Laterality: N/A;   LAPAROSCOPIC SPLENECTOMY  10/2017   [facility]   VASECTOMY      Social History:  reports that he has never smoked. He has never used smokeless tobacco. He reports that he does not drink alcohol and does not use drugs.  Allergies  Allergen Reactions   Pollen Extract Other (See Comments)    Sinus congestion    Family History  Problem Relation Age of Onset   Stroke Mother      Prior to Admission medications   Medication Sig Start Date End Date Taking? Authorizing Provider  oxyCODONE (OXY IR/ROXICODONE) 5 MG immediate release tablet Take 1 tablet (5 mg total) by mouth every 6 (six) hours as needed for breakthrough pain. 06/18/21   Jillyn Ledger, PA-C    Physical Exam: Vitals:   10/27/21 1900 10/27/21 2043 10/27/21 2130 10/27/21 2242  BP: (!) 164/96 (!) 173/86 (!) 181/103 (!) 160/99  Pulse: 66 68 66 70  Resp: 18 14 14 11   Temp:  97.7 F (36.5 C)  98.2 F (36.8 C)  TempSrc:  Oral    SpO2: 99% 98% 97% 99%  Weight:  70.3 kg    Height:  5\' 11"  (1.803 m)     Constitutional: Resting in bed with head elevated, NAD, calm, comfortable Eyes: PERRL, lids and conjunctivae normal ENMT: Mucous membranes are moist. Posterior pharynx clear of any exudate or lesions.Normal dentition.  Neck: normal, supple, no masses. Respiratory: clear to auscultation bilaterally, no wheezing, no crackles. Normal respiratory effort. No accessory muscle use.  Cardiovascular: Regular rate and rhythm, no murmurs / rubs / gallops. No extremity edema. 2+ pedal pulses. Abdomen: Colostomy in place.  No tenderness, no masses palpated. No hepatosplenomegaly.   Musculoskeletal: no clubbing / cyanosis. No joint deformity upper and lower extremities. Good ROM, no contractures. Normal muscle tone.  Skin: no rashes, lesions, ulcers. No induration Neurologic: CN 2-12 grossly intact. Sensation intact. Strength 5/5 in all 4.  Psychiatric: Normal judgment and insight. Alert and oriented x 3. Normal mood.   Labs on Admission: I have personally reviewed following labs and imaging studies  CBC: Recent Labs  Lab 10/27/21 1647  WBC 9.2  NEUTROABS 7.0  HGB 12.6*  HCT 38.1*  MCV 91.1  PLT 811*   Basic Metabolic Panel: Recent Labs  Lab 10/27/21 1647  NA 133*  K 4.8  CL 100  CO2 17*  GLUCOSE 110*  BUN 93*  CREATININE 12.20*  CALCIUM 8.3*   GFR: Estimated Creatinine Clearance: 4.9 mL/min (A) (by C-G formula based on SCr of 12.2 mg/dL (H)). Liver Function Tests: Recent Labs  Lab 10/27/21 1647  AST 13*  ALT 16  ALKPHOS 50  BILITOT 0.3  PROT 7.0  ALBUMIN 3.1*   No results for input(s): LIPASE, AMYLASE in the last 168 hours. No results for input(s): AMMONIA in the last 168 hours. Coagulation Profile: No results for input(s): INR, PROTIME in the last 168 hours. Cardiac Enzymes: No results for input(s): CKTOTAL, CKMB, CKMBINDEX, TROPONINI  in the last 168 hours. BNP (last 3 results) No results for input(s): PROBNP in the last 8760 hours. HbA1C: No results for input(s): HGBA1C in the last 72 hours. CBG: No results for input(s): GLUCAP in the last 168 hours. Lipid Profile: No results for input(s): CHOL, HDL, LDLCALC, TRIG, CHOLHDL, LDLDIRECT in the last 72 hours. Thyroid Function Tests: No results for input(s): TSH, T4TOTAL, FREET4, T3FREE, THYROIDAB in the last 72 hours. Anemia Panel: No results for input(s): VITAMINB12, FOLATE, FERRITIN, TIBC, IRON, RETICCTPCT in the last 72 hours. Urine analysis:    Component Value Date/Time   COLORURINE YELLOW 06/09/2021 [place] 06/09/2021 1223   LABSPEC 1.025 06/09/2021 1223    PHURINE 5.5 06/09/2021 1223   GLUCOSEU NEGATIVE 06/09/2021 1223   HGBUR NEGATIVE 06/09/2021 1223   BILIRUBINUR NEGATIVE 06/09/2021 1223   KETONESUR NEGATIVE 06/09/2021 1223   PROTEINUR NEGATIVE 06/09/2021 1223   UROBILINOGEN 0.2 08/23/2015 1555   NITRITE NEGATIVE 06/09/2021 1223   LEUKOCYTESUR NEGATIVE 06/09/2021 1223    Radiological Exams on Admission: US Renal  Result Date: 10/27/2021 CLINICAL DATA:  Flank pain kidney stone. EXAM: RENAL / URINARY TRACT ULTRASOUND COMPLETE COMPARISON:  None. FINDINGS: Right Kidney: Renal measurements: 10.4 x 5.5 x 5.1 cm = volume: 0.2 mL. Echogenicity within normal limits. There is a 2.8 cm exophytic simple appearing cystic lesion. No solid mass. Moderate hydronephrosis visualized. Left Kidney: Renal measurements: 11.9 x 6.4 x 6.1 cm = volume: 241 mL. Echogenicity within normal limits. No solid mass. At least mild hydronephrosis visualized. Urinary bladder: Decompressed and not well visualized. Other: None. IMPRESSION: 1. Moderate right and mild left hydronephrosis. 2. Decompressed and not well visualized urinary bladder. Electronically Signed   By: Iven Finn M.D.   On: 10/27/2021 17:31   DG C-Arm 1-60 Min-No Report  Result Date: 10/27/2021 Fluoroscopy was utilized by the requesting physician.  No radiographic interpretation.    EKG: Not performed.  Assessment/Plan Principal Problem:   Acute kidney injury superimposed on chronic kidney disease ([place]) Active Problems:   Right ureteral stone   Ureteral stricture, left   Kevin Villegas is a 80 y.o. male with medical history significant for metastatic pancreatic cancer complicated by distal bowel obstruction s/p diverting colectomy, CKD stage IIIa who is admitted with AKI suspected secondary to left ureteral stricture/obstruction.  Acute kidney injury superimposed on CKD stage IIIa Chronic obstructing right ureteral stone with new left hydronephrosis/left ureteral stricture: BUN 93, creatinine  12.20 on admission (baseline creatinine ~1.4-1.5).  Acute renal failure suspected secondary to obstruction due to left ureteral stricture. -S/p left ureteral stent placement 11/9 by urology, Dr. Alinda Money -Complete right-sided obstruction felt related to chronically impacted right ureteral calculus -not amenable to stenting -Monitor urine output, repeat labs in a.m.  Metastatic pancreatic cancer complicated by bowel obstruction s/p diverting colostomy: Chronic and stable.  Continue colostomy care.  Follows with oncology, Dr. Marin Olp.  DVT prophylaxis: Subcutaneous heparin Code Status: Full code, confirmed with patient Family Communication: Discussed with patient, he has discussed with family Disposition Plan: From home, dispo pending clinical progress Consults called: Neurology Level of care: Med-Surg Admission status:  Status is: Observation  The patient remains OBS appropriate and will d/c before 2 midnights.  Zada Finders MD [facility]  If 7PM-7AM, please contact night-coverage [email or web address]  10/27/2021, 10:56 PM

## 2021-10-27 NOTE — ED Notes (Signed)
Report given to Barbara with [facility].  

## 2021-10-27 NOTE — Transfer of Care (Signed)
Immediate Anesthesia Transfer of Care Note  Patient: Kevin Villegas  Procedure(s) Performed: Procedure(s): CYSTOSCOPY WITH RETROGRADE PYELOGRAM/URETERAL STENT PLACEMENT (Left)  Patient Location: PACU  Anesthesia Type:General  Level of Consciousness: Alert, Awake, Oriented  Airway & Oxygen Therapy: Patient Spontanous Breathing  Post-op Assessment: Report given to RN  Post vital signs: Reviewed and stable  Last Vitals:  Vitals:   10/27/21 2043 10/27/21 2130  BP: (!) 173/86 (!) 181/103  Pulse: 68 66  Resp: 14 14  Temp: 36.5 C   SpO2: 70% 76%    Complications: No apparent anesthesia complications

## 2021-10-27 NOTE — Consult Note (Signed)
Urology Consult   Physician requesting consult: Dr. Almyra Free  Reason for consult: Acute renal failure, bilateral hydronephrosis  History of Present Illness: Kevin Villegas is a 80 y.o. seen by Dr. Louis Meckel yesterday.  He was noted to have a large 2.1 cm right distal ureteral stone incidentally on CT imaging in July 2022 with an atrophic right kidney.  Baseline Cr is 1.8.  He had a repeat CT stone study done yesterday that confirmed the right sided findings but with new left hydroureteronephrosis.  He was asymptomatic and was scheduled for elective endoscopic treatment of his stone and evaluation of his possible left ureteral obstruction.  He became anuric today and felt weak and generally poor.  He presented to the [facility] ER and was found to have a Cr of 12.2.  His bladder appeared empty on bedside ultrasound but he still had left hydronephrosis.  He was transferred to the [facility] ED for further evaluation.    He has a history of pancreatic cancer s/p resection in 2018.  He is NED now following systemic therapy.  He also has a colostomy due to a benign source of colon obstruction that is not entirely clear.  COVID negative.  NPO since noon.   Past Medical History:  Diagnosis Date   Colostomy present [facility])    Large bowel obstruction ([place])    Pancreatic cancer [facility])     Past Surgical History:  Procedure Laterality Date   ABDOMINAL SURGERY     BIOPSY  05/20/2021   Procedure: BIOPSY;  Surgeon: Otis Brace, MD;  [facility];  Service: Gastroenterology;;   BOWEL RESECTION     BOWEL RESECTION     CHOLECYSTECTOMY N/A 12/16/2014   Procedure: LAPAROSCOPIC CHOLECYSTECTOMY WITH INTRAOPERATIVE CHOLANGIOGRAM;  Surgeon: Georganna Skeans, MD;  Location: [place];  Service: General;  Laterality: N/A;   COLONIC STENT PLACEMENT N/A 05/26/2021   Procedure: COLONIC STENT PLACEMENT;  Surgeon: Clarene Essex, MD;  [facility];  Service: Endoscopy;  Laterality: N/A;    ESOPHAGOGASTRODUODENOSCOPY N/A 05/20/2021   Procedure: ESOPHAGOGASTRODUODENOSCOPY (EGD);  Surgeon: Otis Brace, MD;  [facility];  Service: Gastroenterology;  Laterality: N/A;   EUS N/A 09/24/2015   Procedure: UPPER ENDOSCOPIC ULTRASOUND (EUS) LINEAR;  Surgeon: Milus Banister, MD;  [facility];  Service: Endoscopy;  Laterality: N/A;   FLEXIBLE SIGMOIDOSCOPY N/A 05/20/2021   Procedure: FLEXIBLE SIGMOIDOSCOPY;  Surgeon: Otis Brace, MD;  [facility];  Service: Gastroenterology;  Laterality: N/A;   FLEXIBLE SIGMOIDOSCOPY N/A 05/26/2021   Procedure: FLEXIBLE SIGMOIDOSCOPY;  Surgeon: Clarene Essex, MD;  [facility];  Service: Endoscopy;  Laterality: N/A;   LAPAROSCOPIC DISTAL PANCREATECTOMY  10/2017   [facility]   LAPAROSCOPIC PARTIAL GASTRECTOMY  10/2017   [facility]   LAPAROSCOPIC SIGMOID COLECTOMY N/A 06/14/2021   Procedure: LAPAROSCOPY WITH MOBILIZATION, PROXIMAL DIVERTING COLOSTOMY, CREATION OF DISTAL MUCOUS FISTULA;  Surgeon: Johnathan Hausen, MD;  [facility];  Service: General;  Laterality: N/A;   LAPAROSCOPIC SPLENECTOMY  10/2017   [facility] Medications:  Home Meds:  No current facility-administered medications on file prior to encounter.   Current Outpatient Medications on File Prior to Encounter  Medication Sig Dispense Refill   oxyCODONE (OXY IR/ROXICODONE) 5 MG immediate release tablet Take 1 tablet (5 mg total) by mouth every 6 (six) hours as needed for breakthrough pain. 15 tablet 0     Scheduled Meds: Continuous Infusions: PRN Meds:.  Allergies:  Allergies  Allergen Reactions  Pollen Extract Other (See Comments)    Sinus congestion    Family History  Problem Relation Age of Onset   Stroke Mother     Social History:  reports that he has never smoked. He has never used smokeless tobacco. He reports that he does not drink alcohol and does not use drugs.  ROS: A complete review of  systems was performed.  All systems are negative except for pertinent findings as noted.  Physical Exam:  Vital signs in last 24 hours: Temp:  [97.7 F (36.5 C)-97.9 F (36.6 C)] 97.7 F (36.5 C) (11/09 2043) Pulse Rate:  [61-74] 68 (11/09 2043) Resp:  [14-18] 14 (11/09 2043) BP: (153-173)/(83-104) 173/86 (11/09 2043) SpO2:  [98 %-100 %] 98 % (11/09 2043) Weight:  [70.3 kg] 70.3 kg (11/09 2043) Constitutional:  Alert and oriented, No acute distress Cardiovascular: Regular rate and rhythm, No JVD Respiratory: Normal respiratory effort, Lungs clear bilaterally GI: Abdomen is soft, nontender, nondistended, no abdominal masses. Left sided colostomy. GU: Moderate left CVA tenderness Lymphatic: No lymphadenopathy Neurologic: Grossly intact, no focal deficits Psychiatric: Normal mood and affect  Laboratory Data:  Recent Labs    10/27/21 1647  WBC 9.2  HGB 12.6*  HCT 38.1*  PLT 529*    Recent Labs    10/27/21 1647  NA 133*  K 4.8  CL 100  GLUCOSE 110*  BUN 93*  CALCIUM 8.3*  CREATININE 12.20*     Results for orders placed or performed during the hospital encounter of 10/27/21 (from the past 24 hour(s))  CBC with Differential/Platelet     Status: Abnormal   Collection Time: 10/27/21  4:47 PM  Result Value Ref Range   WBC 9.2 4.0 - 10.5 K/uL   RBC 4.18 (L) 4.22 - 5.81 MIL/uL   Hemoglobin 12.6 (L) 13.0 - 17.0 g/dL   HCT 38.1 (L) 39.0 - 52.0 %   MCV 91.1 80.0 - 100.0 fL   MCH 30.1 26.0 - 34.0 pg   MCHC 33.1 30.0 - 36.0 g/dL   RDW 13.1 11.5 - 15.5 %   Platelets 529 (H) 150 - 400 K/uL   nRBC 0.0 0.0 - 0.2 %   Neutrophils Relative % 77 %   Neutro Abs 7.0 1.7 - 7.7 K/uL   Lymphocytes Relative 9 %   Lymphs Abs 0.8 0.7 - 4.0 K/uL   Monocytes Relative 13 %   Monocytes Absolute 1.2 (H) 0.1 - 1.0 K/uL   Eosinophils Relative 1 %   Eosinophils Absolute 0.1 0.0 - 0.5 K/uL   Basophils Relative 0 %   Basophils Absolute 0.0 0.0 - 0.1 K/uL   Immature Granulocytes 0 %   Abs  Immature Granulocytes 0.03 0.00 - 0.07 K/uL  Comprehensive metabolic panel     Status: Abnormal   Collection Time: 10/27/21  4:47 PM  Result Value Ref Range   Sodium 133 (L) 135 - 145 mmol/L   Potassium 4.8 3.5 - 5.1 mmol/L   Chloride 100 98 - 111 mmol/L   CO2 17 (L) 22 - 32 mmol/L   Glucose, Bld 110 (H) 70 - 99 mg/dL   BUN 93 (H) 8 - 23 mg/dL   Creatinine, Ser 12.20 (H) 0.61 - 1.24 mg/dL   Calcium 8.3 (L) 8.9 - 10.3 mg/dL   Total Protein 7.0 6.5 - 8.1 g/dL   Albumin 3.1 (L) 3.5 - 5.0 g/dL   AST 13 (L) 15 - 41 U/L   ALT 16 0 - 44 U/L   Alkaline  Phosphatase 50 38 - 126 U/L   Total Bilirubin 0.3 0.3 - 1.2 mg/dL   GFR, Estimated 4 (L) >60 mL/min   Anion gap 16 (H) 5 - 15  Resp Panel by RT-PCR (Flu A&B, Covid) Nasopharyngeal Swab     Status: None   Collection Time: 10/27/21  6:28 PM   Specimen: Nasopharyngeal Swab; Nasopharyngeal(NP) swabs in vial transport medium  Result Value Ref Range   SARS Coronavirus 2 by RT PCR NEGATIVE NEGATIVE   Influenza A by PCR NEGATIVE NEGATIVE   Influenza B by PCR NEGATIVE NEGATIVE   Recent Results (from the past 240 hour(s))  Resp Panel by RT-PCR (Flu A&B, Covid) Nasopharyngeal Swab     Status: None   Collection Time: 10/27/21  6:28 PM   Specimen: Nasopharyngeal Swab; Nasopharyngeal(NP) swabs in vial transport medium  Result Value Ref Range Status   SARS Coronavirus 2 by RT PCR NEGATIVE NEGATIVE Final    Comment: (NOTE) SARS-CoV-2 target nucleic acids are NOT DETECTED.  The SARS-CoV-2 RNA is generally detectable in upper respiratory specimens during the acute phase of infection. The lowest concentration of SARS-CoV-2 viral copies this assay can detect is 138 copies/mL. A negative result does not preclude SARS-Cov-2 infection and should not be used as the sole basis for treatment or other patient management decisions. A negative result may occur with  improper specimen collection/handling, submission of specimen other than nasopharyngeal swab,  presence of viral mutation(s) within the areas targeted by this assay, and inadequate number of viral copies(<138 copies/mL). A negative result must be combined with clinical observations, patient history, and epidemiological information. The expected result is Negative.  Fact Sheet for Patients:  EntrepreneurPulse.com.au  Fact Sheet for Healthcare Providers:  IncredibleEmployment.be  This test is no t yet approved or cleared by the [place] FDA and  has been authorized for detection and/or diagnosis of SARS-CoV-2 by FDA under an Emergency Use Authorization (EUA). This EUA will remain  in effect (meaning this test can be used) for the duration of the COVID-19 declaration under Section 564(b)(1) of the Act, 21 U.S.C.section 360bbb-3(b)(1), unless the authorization is terminated  or revoked sooner.       Influenza A by PCR NEGATIVE NEGATIVE Final   Influenza B by PCR NEGATIVE NEGATIVE Final    Comment: (NOTE) The Xpert Xpress SARS-CoV-2/FLU/RSV plus assay is intended as an aid in the diagnosis of influenza from Nasopharyngeal swab specimens and should not be used as a sole basis for treatment. Nasal washings and aspirates are unacceptable for Xpert Xpress SARS-CoV-2/FLU/RSV testing.  Fact Sheet for Patients: EntrepreneurPulse.com.au  Fact Sheet for Healthcare Providers: IncredibleEmployment.be  This test is not yet approved or cleared by the [place] FDA and has been authorized for detection and/or diagnosis of SARS-CoV-2 by FDA under an Emergency Use Authorization (EUA). This EUA will remain in effect (meaning this test can be used) for the duration of the COVID-19 declaration under Section 564(b)(1) of the Act, 21 U.S.C. section 360bbb-3(b)(1), unless the authorization is terminated or revoked.  Performed at [facility], [address], [place], [place] [address]     Renal  Function: Recent Labs    10/27/21 1647  CREATININE 12.20*   Estimated Creatinine Clearance: 4.9 mL/min (A) (by C-G formula based on SCr of 12.2 mg/dL (H)).  Radiologic Imaging: US Renal  Result Date: 10/27/2021 CLINICAL DATA:  Flank pain kidney stone. EXAM: RENAL / URINARY TRACT ULTRASOUND COMPLETE COMPARISON:  None. FINDINGS: Right Kidney: Renal measurements: 10.4  x 5.5 x 5.1 cm = volume: 0.2 mL. Echogenicity within normal limits. There is a 2.8 cm exophytic simple appearing cystic lesion. No solid mass. Moderate hydronephrosis visualized. Left Kidney: Renal measurements: 11.9 x 6.4 x 6.1 cm = volume: 241 mL. Echogenicity within normal limits. No solid mass. At least mild hydronephrosis visualized. Urinary bladder: Decompressed and not well visualized. Other: None. IMPRESSION: 1. Moderate right and mild left hydronephrosis. 2. Decompressed and not well visualized urinary bladder. Electronically Signed   By: Iven Finn M.D.   On: 10/27/2021 17:31    I independently reviewed the above imaging studies.  Impression/Recommendation Acute kidney injury with chronic obstructing right ureteral stone and atrophic right kidney and new left hydronephrosis: He is to be admitted by [facility] for renal failure. His renal failure is most likely related to new onset left ureteral obstruction of unclear etiology as no stone was seen in the left ureter on his CT yesterday.  In the setting of acute renal failure, I have recommended proceeding to the OR tonight for cystoscopy and bilateral ureteral stent placement to relieve his ureteral obstruction.  I would anticipate his renal failure will resolve relatively quickly and believe his risk for needing dialysis will most likely be low. I will notify Dr. Louis Meckel of his admission to continue follow up.  He will then need further evaluation of his left ureteral obstruction and treatment of his right ureteral stone.  I discussed the potential benefits and risks of the  procedure, side effects of the proposed treatment, the likelihood of the patient achieving the goals of the procedure, and any potential problems that might occur during the procedure or recuperation.  He give informed consent to proceed.   Dutch Gray 10/27/2021, 9:36 PM    Pryor Curia MD   CC: Dr. Almyra Free

## 2021-10-27 NOTE — ED Provider Notes (Addendum)
[facility] EMERGENCY DEPARTMENT Provider Note   CSN: [id number] Arrival date & time: 10/27/21  1558     History Chief Complaint  Patient presents with   Urinary Retention    Kevin Villegas is a 80 y.o. male hx of pancreatic cancer s/p colostomy, here with unable to urinate for a day.  Patient states that he has been having a lot of colostomy output.  He states that he is not urinating for about a day or so.  He actually saw his urologist yesterday and was noted to have a kidney stone on the right side and has severe hydro on the right and moderate hydro on the left.  Patient denies any suprapubic pain.  Denies any fever or vomiting.   The history is provided by the patient.      Past Medical History:  Diagnosis Date   Colostomy present [facility])    Large bowel obstruction ([place])    Pancreatic cancer [facility])     Patient Active Problem List   Diagnosis Date Noted   Protein-calorie malnutrition, severe 06/12/2021   Hydroureter on right 06/10/2021   Aortic atherosclerosis ([place]) 06/10/2021   Renal insufficiency 06/09/2021   Acute prerenal azotemia 06/02/2021   Leukocytosis 06/02/2021   Right ureteral stone 06/02/2021   Large bowel obstruction (HCC) secondary to colonic stricture 06/01/2021   Colon stricture ([place])    Pancreatic mass 06/27/2019   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  with metastatic disease 06/27/2019   Rhinitis medicamentosa 02/26/2019    Past Surgical History:  Procedure Laterality Date   ABDOMINAL SURGERY     BIOPSY  05/20/2021   Procedure: BIOPSY;  Surgeon: Otis Brace, MD;  Location: [facility];  Service: Gastroenterology;;   BOWEL RESECTION     BOWEL RESECTION     CHOLECYSTECTOMY N/A 12/16/2014   Procedure: LAPAROSCOPIC CHOLECYSTECTOMY WITH INTRAOPERATIVE CHOLANGIOGRAM;  Surgeon: Georganna Skeans, MD;  Location: [place];  Service: General;  Laterality: N/A;   COLONIC STENT PLACEMENT N/A 05/26/2021   Procedure: COLONIC STENT  PLACEMENT;  Surgeon: Clarene Essex, MD;  Location: [facility];  Service: Endoscopy;  Laterality: N/A;   ESOPHAGOGASTRODUODENOSCOPY N/A 05/20/2021   Procedure: ESOPHAGOGASTRODUODENOSCOPY (EGD);  Surgeon: Otis Brace, MD;  Location: [facility];  Service: Gastroenterology;  Laterality: N/A;   EUS N/A 09/24/2015   Procedure: UPPER ENDOSCOPIC ULTRASOUND (EUS) LINEAR;  Surgeon: Milus Banister, MD;  Location: [facility];  Service: Endoscopy;  Laterality: N/A;   FLEXIBLE SIGMOIDOSCOPY N/A 05/20/2021   Procedure: FLEXIBLE SIGMOIDOSCOPY;  Surgeon: Otis Brace, MD;  [facility];  Service: Gastroenterology;  Laterality: N/A;   FLEXIBLE SIGMOIDOSCOPY N/A 05/26/2021   Procedure: FLEXIBLE SIGMOIDOSCOPY;  Surgeon: Clarene Essex, MD;  Location[facility];  Service: Endoscopy;  Laterality: N/A;   LAPAROSCOPIC DISTAL PANCREATECTOMY  10/2017   [facility]   LAPAROSCOPIC PARTIAL GASTRECTOMY  10/2017   [facility]   LAPAROSCOPIC SIGMOID COLECTOMY N/A 06/14/2021   Procedure: LAPAROSCOPY WITH MOBILIZATION, PROXIMAL DIVERTING COLOSTOMY, CREATION OF DISTAL MUCOUS FISTULA;  Surgeon: Johnathan Hausen, MD;  Location: [facility];  Service: General;  Laterality: N/A;   LAPAROSCOPIC SPLENECTOMY  10/2017   [facility]   VASECTOMY         Family History  Problem Relation Age of Onset   Stroke Mother     Social History   Tobacco Use   Smoking status: Never   Smokeless tobacco: Never  Vaping Use   Vaping Use: Never used  Substance Use Topics   Alcohol use: No  Alcohol/week: 0.0 standard drinks   Drug use: No    Home Medications Prior to Admission medications   Medication Sig Start Date End Date Taking? Authorizing Provider  oxyCODONE (OXY IR/ROXICODONE) 5 MG immediate release tablet Take 1 tablet (5 mg total) by mouth every 6 (six) hours as needed for breakthrough pain. 06/18/21   Maczis, Barth Kirks, PA-C    Allergies    Pollen extract  Review of Systems   Review of Systems  Genitourinary:  Positive  for difficulty urinating.  All other systems reviewed and are negative.  Physical Exam Updated Vital Signs BP (!) 165/86 (BP Location: Left Arm)   Pulse 63   Temp 97.9 F (36.6 C)   Resp 18   Ht 5\' 10"  (1.778 m)   Wt 70.3 kg   SpO2 99%   BMI 22.24 kg/m   Physical Exam Vitals and nursing note reviewed.  HENT:     Head: Normocephalic.     Nose: Nose normal.     Mouth/Throat:     Mouth: Mucous membranes are dry.  Eyes:     Extraocular Movements: Extraocular movements intact.     Pupils: Pupils are equal, round, and reactive to light.  Cardiovascular:     Rate and Rhythm: Normal rate and regular rhythm.     Pulses: Normal pulses.  Pulmonary:     Effort: Pulmonary effort is normal.     Breath sounds: Normal breath sounds.  Abdominal:     General: Abdomen is flat.     Palpations: Abdomen is soft.     Comments: Colostomy in the left upper quadrant with stool.  No obvious suprapubic tenderness or swelling  Musculoskeletal:        General: Normal range of motion.     Cervical back: Normal range of motion and neck supple.  Skin:    General: Skin is warm.     Capillary Refill: Capillary refill takes less than 2 seconds.  Neurological:     General: No focal deficit present.     Mental Status: He is alert.  Psychiatric:        Mood and Affect: Mood normal.    ED Results / Procedures / Treatments   Labs (all labs ordered are listed, but only abnormal results are displayed) Labs Reviewed  CBC WITH DIFFERENTIAL/PLATELET - Abnormal; Notable for the following components:      Result Value   RBC 4.18 (*)    Hemoglobin 12.6 (*)    HCT 38.1 (*)    Platelets 529 (*)    Monocytes Absolute 1.2 (*)    All other components within normal limits  COMPREHENSIVE METABOLIC PANEL - Abnormal; Notable for the following components:   Sodium 133 (*)    CO2 17 (*)    Glucose, Bld 110 (*)    BUN 93 (*)    Creatinine, Ser 12.20 (*)    Calcium 8.3 (*)    Albumin 3.1 (*)    AST 13 (*)     GFR, Estimated 4 (*)    Anion gap 16 (*)    All other components within normal limits  URINE CULTURE  RESP PANEL BY RT-PCR (FLU A&B, COVID) ARPGX2  URINALYSIS, ROUTINE W REFLEX MICROSCOPIC    EKG None  Radiology US Renal  Result Date: 10/27/2021 CLINICAL DATA:  Flank pain kidney stone. EXAM: RENAL / URINARY TRACT ULTRASOUND COMPLETE COMPARISON:  None. FINDINGS: Right Kidney: Renal measurements: 10.4 x 5.5 x 5.1 cm = volume: 0.2 mL. Echogenicity within  normal limits. There is a 2.8 cm exophytic simple appearing cystic lesion. No solid mass. Moderate hydronephrosis visualized. Left Kidney: Renal measurements: 11.9 x 6.4 x 6.1 cm = volume: 241 mL. Echogenicity within normal limits. No solid mass. At least mild hydronephrosis visualized. Urinary bladder: Decompressed and not well visualized. Other: None. IMPRESSION: 1. Moderate right and mild left hydronephrosis. 2. Decompressed and not well visualized urinary bladder. Electronically Signed   By: Iven Finn M.D.   On: 10/27/2021 17:31    Procedures Procedures   EMERGENCY DEPARTMENT ULTRASOUND  Study: Limited Ultrasound of Bladder  INDICATIONS: to assess for urinary retention and/or bladder volume prior to urinary catheter Multiple views of the bladder were obtained in real-time in the transverse and longitudinal planes with a multi-frequency probe.  PERFORMED BY: Myself IMAGES ARCHIVED?: Yes LIMITATIONS:   INTERPRETATION:  small volume   CT urogram  Stable 6 mm distal right ureteral calculus, with severe right hydroureteronephrosis.  Increased moderate to severe left hydronephrosis. No ureteral calculi or other obstructing etiology apparent on this exam. Consider abdomen pelvis CT with contrast for further evaluation if clinically warranted.   CRITICAL CARE Performed by: Wandra Arthurs   Total critical care time: 30 minutes  Critical care time was exclusive of separately billable procedures and treating other  patients.  Critical care was necessary to treat or prevent imminent or life-threatening deterioration.  Critical care was time spent personally by me on the following activities: development of treatment plan with patient and/or surrogate as well as nursing, discussions with consultants, evaluation of patient's response to treatment, examination of patient, obtaining history from patient or surrogate, ordering and performing treatments and interventions, ordering and review of laboratory studies, ordering and review of radiographic studies, pulse oximetry and re-evaluation of patient's condition.    Medications Ordered in ED Medications  sodium chloride 0.9 % bolus 1,000 mL (0 mLs Intravenous Stopped 10/27/21 1753)    ED Course  I have reviewed the triage vital signs and the nursing notes.  Pertinent labs & imaging results that were available during my care of the patient were reviewed by me and considered in my medical decision making (see chart for details).    MDM Rules/Calculators/A&P                           Damion Kant is a 80 y.o. male here presenting with decreased urination.  Patient has a kidney stone that is obstructive as per the CT scan done yesterday.  Patient had decreased urination since yesterday Bedside ultrasound did not show any obvious retention.  Consider dehydration versus retained kidney stone with worsening hydro.  Plan to get CBC and CMP and UA and renal ultrasound.  Will hydrate patient and reassess  6:44 PM Patient's creatinine is now 12 from 1.8 on 10/27.  Patient again has moderate hydro on the right and mild hydro on the left.  Patient has decompressed bladder on ultrasound.  I am concerned for his acute renal failure and I think is likely secondary to this hydronephrosis.  I consulted Dr. Alinda Money from urology.  He wants patient to be emergently transferred to [facility] emergency department.  He plans to do stents bilaterally and if he is unable to get  stents, he will place nephrostomy tube.  He recommend hospitalist admission.  Patient will be transferred to the ED under Dr. Almyra Free. Once he gets to [facility], hospitalist will need to be called. COVID test sent  Final Clinical Impression(s) / ED Diagnoses Final diagnoses:  Urinary retention    Rx / DC Orders ED Discharge Orders     None        Drenda Freeze, MD 10/27/21 1844    Drenda Freeze, MD 10/27/21 [phone]

## 2021-10-27 NOTE — Telephone Encounter (Signed)
Left message informing pt of Dettinger's recommendations. Asked to call office back if needed.

## 2021-10-27 NOTE — Telephone Encounter (Signed)
We do not do catheters here, he will either have to wait till urology tomorrow or if he has a significant pain I would go into the ER tonight

## 2021-10-27 NOTE — Progress Notes (Signed)
Patient ID: Kevin Villegas, male   DOB: 1941-08-15, 80 y.o.   MRN: [id number]   Please see operative note for full details.  I was able to place a left ureteral stent.  There did appear to be evidence of left ureteral stricture which may be related to ischemic change, extrinsic obstruction, or intrinsic obstruction.  However, considering his prior extensive and complex colon surgery, this could potentially be related to ischemic change most likely.  I was unable to place a right ureteral stent due to complete obstruction likely related to a chronically impacted right ureteral calculus.  Considering the fact that he has right renal atrophy likely consistent with chronic obstruction, the most likely cause of his acute renal failure is left ureteral obstruction which is now resolved.  He will ultimately require further evaluation by Dr. Louis Meckel endoscopically to further determine the etiology for his ureteral obstruction.

## 2021-10-27 NOTE — ED Triage Notes (Signed)
Pt to ED from [place] via [place] for urinary retention x 24hr.  Pt diagnosed yesterday with enlarged prostate, no intervention for retention.  Pt now with bilateral hydronephrosis.  Urology to come to see patient tonight with possible surgery for bilateral stent placement.

## 2021-10-28 ENCOUNTER — Encounter (HOSPITAL_COMMUNITY): Payer: Self-pay | Admitting: Urology

## 2021-10-28 DIAGNOSIS — N189 Chronic kidney disease, unspecified: Secondary | ICD-10-CM | POA: Diagnosis not present

## 2021-10-28 DIAGNOSIS — R34 Anuria and oliguria: Secondary | ICD-10-CM | POA: Diagnosis present

## 2021-10-28 DIAGNOSIS — Z9081 Acquired absence of spleen: Secondary | ICD-10-CM | POA: Diagnosis not present

## 2021-10-28 DIAGNOSIS — Z903 Acquired absence of stomach [part of]: Secondary | ICD-10-CM | POA: Diagnosis not present

## 2021-10-28 DIAGNOSIS — E875 Hyperkalemia: Secondary | ICD-10-CM

## 2021-10-28 DIAGNOSIS — N201 Calculus of ureter: Secondary | ICD-10-CM | POA: Diagnosis not present

## 2021-10-28 DIAGNOSIS — R339 Retention of urine, unspecified: Secondary | ICD-10-CM | POA: Diagnosis present

## 2021-10-28 DIAGNOSIS — N179 Acute kidney failure, unspecified: Secondary | ICD-10-CM | POA: Diagnosis present

## 2021-10-28 DIAGNOSIS — K5730 Diverticulosis of large intestine without perforation or abscess without bleeding: Secondary | ICD-10-CM | POA: Diagnosis present

## 2021-10-28 DIAGNOSIS — I129 Hypertensive chronic kidney disease with stage 1 through stage 4 chronic kidney disease, or unspecified chronic kidney disease: Secondary | ICD-10-CM | POA: Diagnosis present

## 2021-10-28 DIAGNOSIS — C799 Secondary malignant neoplasm of unspecified site: Secondary | ICD-10-CM | POA: Diagnosis present

## 2021-10-28 DIAGNOSIS — N135 Crossing vessel and stricture of ureter without hydronephrosis: Secondary | ICD-10-CM

## 2021-10-28 DIAGNOSIS — Z9109 Other allergy status, other than to drugs and biological substances: Secondary | ICD-10-CM | POA: Diagnosis not present

## 2021-10-28 DIAGNOSIS — Z8507 Personal history of malignant neoplasm of pancreas: Secondary | ICD-10-CM | POA: Diagnosis not present

## 2021-10-28 DIAGNOSIS — Z90411 Acquired partial absence of pancreas: Secondary | ICD-10-CM | POA: Diagnosis not present

## 2021-10-28 DIAGNOSIS — N131 Hydronephrosis with ureteral stricture, not elsewhere classified: Secondary | ICD-10-CM | POA: Diagnosis present

## 2021-10-28 DIAGNOSIS — Z933 Colostomy status: Secondary | ICD-10-CM | POA: Diagnosis not present

## 2021-10-28 DIAGNOSIS — J90 Pleural effusion, not elsewhere classified: Secondary | ICD-10-CM | POA: Diagnosis present

## 2021-10-28 DIAGNOSIS — N202 Calculus of kidney with calculus of ureter: Secondary | ICD-10-CM | POA: Diagnosis present

## 2021-10-28 DIAGNOSIS — N1330 Unspecified hydronephrosis: Secondary | ICD-10-CM | POA: Diagnosis not present

## 2021-10-28 DIAGNOSIS — Z20822 Contact with and (suspected) exposure to covid-19: Secondary | ICD-10-CM | POA: Diagnosis present

## 2021-10-28 DIAGNOSIS — E872 Acidosis, unspecified: Secondary | ICD-10-CM | POA: Diagnosis present

## 2021-10-28 DIAGNOSIS — N1831 Chronic kidney disease, stage 3a: Secondary | ICD-10-CM | POA: Diagnosis present

## 2021-10-28 LAB — BASIC METABOLIC PANEL
Anion gap: 13 (ref 5–15)
BUN: 86 mg/dL — ABNORMAL HIGH (ref 8–23)
CO2: 17 mmol/L — ABNORMAL LOW (ref 22–32)
Calcium: 8.7 mg/dL — ABNORMAL LOW (ref 8.9–10.3)
Chloride: 105 mmol/L (ref 98–111)
Creatinine, Ser: 9.39 mg/dL — ABNORMAL HIGH (ref 0.61–1.24)
GFR, Estimated: 5 mL/min — ABNORMAL LOW (ref >60)
Glucose, Bld: 138 mg/dL — ABNORMAL HIGH (ref 70–99)
Potassium: 5.5 mmol/L — ABNORMAL HIGH (ref 3.5–5.1)
Sodium: 135 mmol/L (ref 135–145)

## 2021-10-28 LAB — CBC
HCT: 40.5 % (ref 39.0–52.0)
Hemoglobin: 13.5 g/dL (ref 13.0–17.0)
MCH: 30.1 pg (ref 26.0–34.0)
MCHC: 33.3 g/dL (ref 30.0–36.0)
MCV: 90.4 fL (ref 80.0–100.0)
Platelets: 591 10*3/uL — ABNORMAL HIGH (ref 150–400)
RBC: 4.48 MIL/uL (ref 4.22–5.81)
RDW: 13.2 % (ref 11.5–15.5)
WBC: 10.7 10*3/uL — ABNORMAL HIGH (ref 4.0–10.5)
nRBC: 0 % (ref 0.0–0.2)

## 2021-10-28 LAB — POTASSIUM: Potassium: 5.6 mmol/L — ABNORMAL HIGH (ref 3.5–5.1)

## 2021-10-28 MED ORDER — SODIUM CHLORIDE 0.9% FLUSH
10.0000 mL | INTRAVENOUS | Status: DC | PRN
  Administered 2021-10-29 – 2021-10-30 (×2): 10 mL

## 2021-10-28 MED ORDER — CHLORHEXIDINE GLUCONATE CLOTH 2 % EX PADS
6.0000 | MEDICATED_PAD | Freq: Every day | CUTANEOUS | Status: DC
  Administered 2021-10-28 – 2021-10-29 (×2): 6 via TOPICAL

## 2021-10-28 MED ORDER — SODIUM CHLORIDE 0.9% FLUSH: 10.0000 mL | Freq: Two times a day (BID) | INTRAVENOUS | Status: DC

## 2021-10-28 MED ORDER — HYDRALAZINE HCL 25 MG PO TABS
25.0000 mg | ORAL_TABLET | Freq: Four times a day (QID) | ORAL | Status: DC | PRN
  Administered 2021-10-28 – 2021-10-30 (×2): 25 mg via ORAL
  Filled 2021-10-28 (×2): qty 1

## 2021-10-28 NOTE — Hospital Course (Signed)
Kevin Villegas is a 80 y.o. male with a history of metastatic pancreatic cancer, distal bowel obstruction s/p diverting colostomy, CKD stage IIIa, chronic obstructing right ureteral stone. Patient presented secondary to urinary retention. Urology consulted. Patient underwent left ureteral stent placement for moderate-severe left hydronephrosis of uncertain etiology. He has an associated AKI which is improving.

## 2021-10-28 NOTE — Assessment & Plan Note (Addendum)
Chronic. Plan for outpatient lithotripsy per urology

## 2021-10-28 NOTE — Progress Notes (Signed)
PROGRESS NOTE    Kevin Villegas  DXI:[id number] DOB: 17-Apr-1941 DOA: 10/27/2021 PCP: Dettinger, [name], MD   Brief Narrative: Kevin Villegas is a 80 y.o. male with a history of metastatic pancreatic cancer, distal bowel obstruction s/p diverting colostomy, CKD stage IIIa, chronic obstructing right ureteral stone. Patient presented secondary to urinary retention. Urology consulted. Patient underwent left ureteral stent placement for moderate-severe left hydronephrosis of uncertain etiology. He has an associated AKI which is improving.   Assessment & Plan:   * Acute kidney injury superimposed on chronic kidney disease (HCC) Baseline creatinine of about 1.4 - 1.6. Creatinine of 12.2 on admission secondary to acute left sided hydronephrosis. Creatinine improving after ureteral stent placement. -Daily BMP -Strict in/out  Right ureteral stone Chronic.  Hydronephrosis of left kidney Ureteral stent placed for treatment as mentioned above  Ureteral stricture, left Uncertain etiology. Urology placed ureteral stent on 11/9.  Hyperkalemia Secondary to AKI -Repeat potassium. If trending up, will give a dose of Lokelma 10 g x1 and repeat potassium     DVT prophylaxis: Heparin subq Code Status:   Code Status: Full Code Family Communication: None at bedside Disposition Plan: Discharge home likely in 1-3 days pending improvement of creatinine     Consultants:  Urology   Procedures:  CYSTOSCOPY WITH RETROGRADE PYELOGRAM/URETERAL STENT PLACEMENT OF LEFT URETER (10/27/2021)   Antimicrobials: None      Subjective: Urinating well. No issues overnight.  Objective: Vitals:   10/28/21 0617 10/28/21 1030 10/28/21 1130 10/28/21 1306  BP: (!) 144/96 (!) 181/99 (!) 157/100 (!) 174/102  Pulse: 75 84 79 84  Resp: 16 18  18   Temp: (!) 97.5 F (36.4 C) 97.6 F (36.4 C)  97.7 F (36.5 C)  TempSrc: Oral Oral  Oral  SpO2: 97% 98%  98%  Weight:      Height:        Intake/Output  Summary (Last 24 hours) at 10/28/2021 1540 Last data filed at 10/28/2021 1310 Gross per 24 hour  Intake 2191.93 ml  Output 3700 ml  Net -1508.07 ml   Filed Weights   10/27/21 2043 10/28/21 0030 10/28/21 0500  Weight: 70.3 kg 73.3 kg 72.6 kg    Examination:  General exam: Appears calm and comfortable Respiratory system: Clear to auscultation. Respiratory effort normal. Cardiovascular system: S1 & S2 heard, RRR. 2/6 systolic murmur Gastrointestinal system: Abdomen is nondistended, soft and nontender. No organomegaly or masses felt. Normal bowel sounds heard. Central nervous system: Alert and oriented. No focal neurological deficits. Musculoskeletal: No edema. No calf tenderness Skin: No cyanosis. No rashes Psychiatry: Judgement and insight appear normal. Mood & affect appropriate.    Data Reviewed: I have personally reviewed following labs and imaging studies  CBC Lab Results  Component Value Date   WBC 10.7 (H) 10/28/2021   RBC 4.48 10/28/2021   HGB 13.5 10/28/2021   HCT 40.5 10/28/2021   MCV 90.4 10/28/2021   MCH 30.1 10/28/2021   PLT 591 (H) 10/28/2021   MCHC 33.3 10/28/2021   RDW 13.2 10/28/2021   LYMPHSABS 0.8 10/27/2021   MONOABS 1.2 (H) 10/27/2021   EOSABS 0.1 10/27/2021   BASOSABS 0.0 76/73/4193     Last metabolic panel Lab Results  Component Value Date   NA 135 10/28/2021   K 5.5 (H) 10/28/2021   CL 105 10/28/2021   CO2 17 (L) 10/28/2021   BUN 86 (H) 10/28/2021   CREATININE 9.39 (H) 10/28/2021   GLUCOSE 138 (H) 10/28/2021   GFRNONAA 5 (L)  10/28/2021   GFRAA >60 11/24/2019   CALCIUM 8.7 (L) 10/28/2021   PHOS 3.1 06/16/2021   PROT 7.0 10/27/2021   ALBUMIN 3.1 (L) 10/27/2021   LABGLOB 3.1 10/13/2021   AGRATIO 1.4 10/13/2021   BILITOT 0.3 10/27/2021   ALKPHOS 50 10/27/2021   AST 13 (L) 10/27/2021   ALT 16 10/27/2021   ANIONGAP 13 10/28/2021    CBG (last 3)  No results for input(s): GLUCAP in the last 72 hours.   GFR: Estimated Creatinine  Clearance: 6.6 mL/min (A) (by C-G formula based on SCr of 9.39 mg/dL (H)).  Coagulation Profile: No results for input(s): INR, PROTIME in the last 168 hours.  Recent Results (from the past 240 hour(s))  Resp Panel by RT-PCR (Flu A&B, Covid) Nasopharyngeal Swab     Status: None   Collection Time: 10/27/21  6:28 PM   Specimen: Nasopharyngeal Swab; Nasopharyngeal(NP) swabs in vial transport medium  Result Value Ref Range Status   SARS Coronavirus 2 by RT PCR NEGATIVE NEGATIVE Final    Comment: (NOTE) SARS-CoV-2 target nucleic acids are NOT DETECTED.  The SARS-CoV-2 RNA is generally detectable in upper respiratory specimens during the acute phase of infection. The lowest concentration of SARS-CoV-2 viral copies this assay can detect is 138 copies/mL. A negative result does not preclude SARS-Cov-2 infection and should not be used as the sole basis for treatment or other patient management decisions. A negative result may occur with  improper specimen collection/handling, submission of specimen other than nasopharyngeal swab, presence of viral mutation(s) within the areas targeted by this assay, and inadequate number of viral copies(<138 copies/mL). A negative result must be combined with clinical observations, patient history, and epidemiological information. The expected result is Negative.  Fact Sheet for Patients:  EntrepreneurPulse.com.au  Fact Sheet for Healthcare Providers:  IncredibleEmployment.be  This test is no t yet approved or cleared by the [place] FDA and  has been authorized for detection and/or diagnosis of SARS-CoV-2 by FDA under an Emergency Use Authorization (EUA). This EUA will remain  in effect (meaning this test can be used) for the duration of the COVID-19 declaration under Section 564(b)(1) of the Act, 21 U.S.C.section 360bbb-3(b)(1), unless the authorization is terminated  or revoked sooner.       Influenza A by  PCR NEGATIVE NEGATIVE Final   Influenza B by PCR NEGATIVE NEGATIVE Final    Comment: (NOTE) The Xpert Xpress SARS-CoV-2/FLU/RSV plus assay is intended as an aid in the diagnosis of influenza from Nasopharyngeal swab specimens and should not be used as a sole basis for treatment. Nasal washings and aspirates are unacceptable for Xpert Xpress SARS-CoV-2/FLU/RSV testing.  Fact Sheet for Patients: EntrepreneurPulse.com.au  Fact Sheet for Healthcare Providers: IncredibleEmployment.be  This test is not yet approved or cleared by the [place] FDA and has been authorized for detection and/or diagnosis of SARS-CoV-2 by FDA under an Emergency Use Authorization (EUA). This EUA will remain in effect (meaning this test can be used) for the duration of the COVID-19 declaration under Section 564(b)(1) of the Act, 21 U.S.C. section 360bbb-3(b)(1), unless the authorization is terminated or revoked.  Performed at [facility], [address] [place], [place] [address]   Resp panel by RT-PCR (RSV, Flu A&B, Covid)     Status: None   Collection Time: 10/27/21  6:28 PM  Result Value Ref Range Status   SARS Coronavirus 2 by RT PCR NEGATIVE NEGATIVE Final    Comment: (NOTE) SARS-CoV-2 target nucleic acids are  NOT DETECTED.  The SARS-CoV-2 RNA is generally detectable in upper respiratory specimens during the acute phase of infection. The lowest concentration of SARS-CoV-2 viral copies this assay can detect is 138 copies/mL. A negative result does not preclude SARS-Cov-2 infection and should not be used as the sole basis for treatment or other patient management decisions. A negative result may occur with  improper specimen collection/handling, submission of specimen other than nasopharyngeal swab, presence of viral mutation(s) within the areas targeted by this assay, and inadequate number of viral copies(<138 copies/mL). A negative result must be  combined with clinical observations, patient history, and epidemiological information. The expected result is Negative.  Fact Sheet for Patients:  EntrepreneurPulse.com.au  Fact Sheet for Healthcare Providers:  IncredibleEmployment.be  This test is no t yet approved or cleared by the [place] FDA and  has been authorized for detection and/or diagnosis of SARS-CoV-2 by FDA under an Emergency Use Authorization (EUA). This EUA will remain  in effect (meaning this test can be used) for the duration of the COVID-19 declaration under Section 564(b)(1) of the Act, 21 U.S.C.section 360bbb-3(b)(1), unless the authorization is terminated  or revoked sooner.       Influenza A by PCR NEGATIVE NEGATIVE Final   Influenza B by PCR NEGATIVE NEGATIVE Final    Comment: (NOTE) The Xpert Xpress SARS-CoV-2/FLU/RSV plus assay is intended as an aid in the diagnosis of influenza from Nasopharyngeal swab specimens and should not be used as a sole basis for treatment. Nasal washings and aspirates are unacceptable for Xpert Xpress SARS-CoV-2/FLU/RSV testing.  Fact Sheet for Patients: EntrepreneurPulse.com.au  Fact Sheet for Healthcare Providers: IncredibleEmployment.be  This test is not yet approved or cleared by the [place] FDA and has been authorized for detection and/or diagnosis of SARS-CoV-2 by FDA under an Emergency Use Authorization (EUA). This EUA will remain in effect (meaning this test can be used) for the duration of the COVID-19 declaration under Section 564(b)(1) of the Act, 21 U.S.C. section 360bbb-3(b)(1), unless the authorization is terminated or revoked.     Resp Syncytial Virus by PCR NEGATIVE NEGATIVE Final    Comment: (NOTE) Fact Sheet for Patients: EntrepreneurPulse.com.au  Fact Sheet for Healthcare Providers: IncredibleEmployment.be  This test is not yet  approved or cleared by the [place] FDA and has been authorized for detection and/or diagnosis of SARS-CoV-2 by FDA under an Emergency Use Authorization (EUA). This EUA will remain in effect (meaning this test can be used) for the duration of the COVID-19 declaration under Section 564(b)(1) of the Act, 21 U.S.C. section 360bbb-3(b)(1), unless the authorization is terminated or revoked.  Performed at [facility], [place] [address], [place], [place] [address]         Radiology Studies: US Renal  Result Date: 10/27/2021 CLINICAL DATA:  Flank pain kidney stone. EXAM: RENAL / URINARY TRACT ULTRASOUND COMPLETE COMPARISON:  None. FINDINGS: Right Kidney: Renal measurements: 10.4 x 5.5 x 5.1 cm = volume: 0.2 mL. Echogenicity within normal limits. There is a 2.8 cm exophytic simple appearing cystic lesion. No solid mass. Moderate hydronephrosis visualized. Left Kidney: Renal measurements: 11.9 x 6.4 x 6.1 cm = volume: 241 mL. Echogenicity within normal limits. No solid mass. At least mild hydronephrosis visualized. Urinary bladder: Decompressed and not well visualized. Other: None. IMPRESSION: 1. Moderate right and mild left hydronephrosis. 2. Decompressed and not well visualized urinary bladder. Electronically Signed   By: Iven Finn M.D.   On: 10/27/2021 17:31   DG C-Arm 1-60 Min-No  Report  Result Date: 10/27/2021 Fluoroscopy was utilized by the requesting physician.  No radiographic interpretation.        Scheduled Meds:  Chlorhexidine Gluconate Cloth  6 each Topical Daily   heparin  5,000 Units Subcutaneous Q8H   Continuous Infusions:   LOS: 0 days     Cordelia Poche, MD [facility] 10/28/2021, 3:40 PM  If 7PM-7AM, please contact night-coverage [email or web address]

## 2021-10-28 NOTE — Plan of Care (Signed)
Pt alert and oriented x 4. Reports weakness over last couple days needing assistance. Pt has had good urine output approx 950 since arriving to unit at 0100. Am labs reported to NP J. Olena Heckle see previous not. No new orders received. Vitals stable. Received 1 dose of prn tylenol for mild pain at penis related to cystoscopy completed.  Problem: Education: Goal: Knowledge of General Education information will improve Description: Including pain rating scale, medication(s)/side effects and non-pharmacologic comfort measures Outcome: Progressing   Problem: Health Behavior/Discharge Planning: Goal: Ability to manage health-related needs will improve Outcome: Progressing   Problem: Clinical Measurements: Goal: Ability to maintain clinical measurements within normal limits will improve Outcome: Progressing Goal: Will remain free from infection Outcome: Progressing Goal: Diagnostic test results will improve Outcome: Progressing Goal: Respiratory complications will improve Outcome: Progressing Goal: Cardiovascular complication will be avoided Outcome: Progressing   Problem: Activity: Goal: Risk for activity intolerance will decrease Outcome: Progressing   Problem: Elimination: Goal: Will not experience complications related to bowel motility Outcome: Progressing Goal: Will not experience complications related to urinary retention Outcome: Progressing   Problem: Pain Managment: Goal: General experience of comfort will improve Outcome: Progressing   Problem: Safety: Goal: Ability to remain free from injury will improve Outcome: Progressing

## 2021-10-28 NOTE — Subjective & Objective (Signed)
PROGRESS NOTE    Kevin Villegas  VCB:[id number] DOB: [birth year] DOA: 10/27/2021 PCP: Dettinger, [name], MD   Brief Narrative: Kevin Villegas is a 80 y.o. male with a history of metastatic pancreatic cancer, distal bowel obstruction s/p diverting colostomy, CKD stage IIIa, chronic obstructing right ureteral stone. Patient presented secondary to urinary retention. Urology consulted. Patient underwent left ureteral stent placement for moderate-severe left hydronephrosis of uncertain etiology. He has an associated AKI which is improving.   Assessment & Plan:   * Acute kidney injury superimposed on chronic kidney disease (HCC) Baseline creatinine of about 1.4 - 1.6. Creatinine of 12.2 on admission secondary to acute left sided hydronephrosis. Creatinine improving after ureteral stent placement. -Daily BMP -Strict in/out  Right ureteral stone Chronic.  Hydronephrosis of left kidney Ureteral stent placed for treatment as mentioned above  Ureteral stricture, left Uncertain etiology. Urology placed ureteral stent on 11/9.  Hyperkalemia Secondary to AKI -Repeat potassium. If trending up, will give a dose of Lokelma 10 g x1 and repeat potassium      DVT prophylaxis: Heparin subq Code Status:   Code Status: Full Code Family Communication: None at bedside Disposition Plan: Discharge home likely in 1-3 days pending improvement of creatinine   Consultants:  Urology  Procedures:  CYSTOSCOPY WITH RETROGRADE PYELOGRAM/URETERAL STENT PLACEMENT OF LEFT URETER (10/27/2021)  Antimicrobials: None    Subjective: Urinating well. No issues overnight.  Objective: Vitals:   10/28/21 0617 10/28/21 1030 10/28/21 1130 10/28/21 1306  BP: (!) 144/96 (!) 181/99 (!) 157/100 (!) 174/102  Pulse: 75 84 79 84  Resp: 16 18  18   Temp: (!) 97.5 F (36.4 C) 97.6 F (36.4 C)  97.7 F (36.5 C)  TempSrc: Oral Oral  Oral  SpO2: 97% 98%  98%  Weight:      Height:        Intake/Output Summary  (Last 24 hours) at 10/28/2021 1319 Last data filed at 10/28/2021 1310 Gross per 24 hour  Intake 2191.93 ml  Output 3700 ml  Net -1508.07 ml   Filed Weights   10/27/21 2043 10/28/21 0030 10/28/21 0500  Weight: 70.3 kg 73.3 kg 72.6 kg    Examination:  General exam: Appears calm and comfortable Respiratory system: Clear to auscultation. Respiratory effort normal. Cardiovascular system: S1 & S2 heard, RRR. 2/6 systolic murmur Gastrointestinal system: Abdomen is nondistended, soft and nontender. No organomegaly or masses felt. Normal bowel sounds heard. Central nervous system: Alert and oriented. No focal neurological deficits. Musculoskeletal: No edema. No calf tenderness Skin: No cyanosis. No rashes Psychiatry: Judgement and insight appear normal. Mood & affect appropriate.     Data Reviewed: I have personally reviewed following labs and imaging studies  CBC Lab Results  Component Value Date   WBC 10.7 (H) 10/28/2021   RBC 4.48 10/28/2021   HGB 13.5 10/28/2021   HCT 40.5 10/28/2021   MCV 90.4 10/28/2021   MCH 30.1 10/28/2021   PLT 591 (H) 10/28/2021   MCHC 33.3 10/28/2021   RDW 13.2 10/28/2021   LYMPHSABS 0.8 10/27/2021   MONOABS 1.2 (H) 10/27/2021   EOSABS 0.1 10/27/2021   BASOSABS 0.0 38/46/6599     Last metabolic panel Lab Results  Component Value Date   NA 135 10/28/2021   K 5.5 (H) 10/28/2021   CL 105 10/28/2021   CO2 17 (L) 10/28/2021   BUN 86 (H) 10/28/2021   CREATININE 9.39 (H) 10/28/2021   GLUCOSE 138 (H) 10/28/2021   GFRNONAA 5 (L) 10/28/2021   GFRAA >  60 11/24/2019   CALCIUM 8.7 (L) 10/28/2021   PHOS 3.1 06/16/2021   PROT 7.0 10/27/2021   ALBUMIN 3.1 (L) 10/27/2021   LABGLOB 3.1 10/13/2021   AGRATIO 1.4 10/13/2021   BILITOT 0.3 10/27/2021   ALKPHOS 50 10/27/2021   AST 13 (L) 10/27/2021   ALT 16 10/27/2021   ANIONGAP 13 10/28/2021    CBG (last 3)  No results for input(s): GLUCAP in the last 72 hours.   GFR: Estimated Creatinine  Clearance: 6.6 mL/min (A) (by C-G formula based on SCr of 9.39 mg/dL (H)).  Coagulation Profile: No results for input(s): INR, PROTIME in the last 168 hours.  Recent Results (from the past 240 hour(s))  Resp Panel by RT-PCR (Flu A&B, Covid) Nasopharyngeal Swab     Status: None   Collection Time: 10/27/21  6:28 PM   Specimen: Nasopharyngeal Swab; Nasopharyngeal(NP) swabs in vial transport medium  Result Value Ref Range Status   SARS Coronavirus 2 by RT PCR NEGATIVE NEGATIVE Final    Comment: (NOTE) SARS-CoV-2 target nucleic acids are NOT DETECTED.  The SARS-CoV-2 RNA is generally detectable in upper respiratory specimens during the acute phase of infection. The lowest concentration of SARS-CoV-2 viral copies this assay can detect is 138 copies/mL. A negative result does not preclude SARS-Cov-2 infection and should not be used as the sole basis for treatment or other patient management decisions. A negative result may occur with  improper specimen collection/handling, submission of specimen other than nasopharyngeal swab, presence of viral mutation(s) within the areas targeted by this assay, and inadequate number of viral copies(<138 copies/mL). A negative result must be combined with clinical observations, patient history, and epidemiological information. The expected result is Negative.  Fact Sheet for Patients:  EntrepreneurPulse.com.au  Fact Sheet for Healthcare Providers:  IncredibleEmployment.be  This test is no t yet approved or cleared by the [place] FDA and  has been authorized for detection and/or diagnosis of SARS-CoV-2 by FDA under an Emergency Use Authorization (EUA). This EUA will remain  in effect (meaning this test can be used) for the duration of the COVID-19 declaration under Section 564(b)(1) of the Act, 21 U.S.C.section 360bbb-3(b)(1), unless the authorization is terminated  or revoked sooner.       Influenza A by  PCR NEGATIVE NEGATIVE Final   Influenza B by PCR NEGATIVE NEGATIVE Final    Comment: (NOTE) The Xpert Xpress SARS-CoV-2/FLU/RSV plus assay is intended as an aid in the diagnosis of influenza from Nasopharyngeal swab specimens and should not be used as a sole basis for treatment. Nasal washings and aspirates are unacceptable for Xpert Xpress SARS-CoV-2/FLU/RSV testing.  Fact Sheet for Patients: EntrepreneurPulse.com.au  Fact Sheet for Healthcare Providers: IncredibleEmployment.be  This test is not yet approved or cleared by the [place] FDA and has been authorized for detection and/or diagnosis of SARS-CoV-2 by FDA under an Emergency Use Authorization (EUA). This EUA will remain in effect (meaning this test can be used) for the duration of the COVID-19 declaration under Section 564(b)(1) of the Act, 21 U.S.C. section 360bbb-3(b)(1), unless the authorization is terminated or revoked.  Performed at [facility], [address] [place], [place] [address]   Resp panel by RT-PCR (RSV, Flu A&B, Covid)     Status: None   Collection Time: 10/27/21  6:28 PM  Result Value Ref Range Status   SARS Coronavirus 2 by RT PCR NEGATIVE NEGATIVE Final    Comment: (NOTE) SARS-CoV-2 target nucleic acids are NOT DETECTED.  The  SARS-CoV-2 RNA is generally detectable in upper respiratory specimens during the acute phase of infection. The lowest concentration of SARS-CoV-2 viral copies this assay can detect is 138 copies/mL. A negative result does not preclude SARS-Cov-2 infection and should not be used as the sole basis for treatment or other patient management decisions. A negative result may occur with  improper specimen collection/handling, submission of specimen other than nasopharyngeal swab, presence of viral mutation(s) within the areas targeted by this assay, and inadequate number of viral copies(<138 copies/mL). A negative result must be  combined with clinical observations, patient history, and epidemiological information. The expected result is Negative.  Fact Sheet for Patients:  EntrepreneurPulse.com.au  Fact Sheet for Healthcare Providers:  IncredibleEmployment.be  This test is no t yet approved or cleared by the [place] FDA and  has been authorized for detection and/or diagnosis of SARS-CoV-2 by FDA under an Emergency Use Authorization (EUA). This EUA will remain  in effect (meaning this test can be used) for the duration of the COVID-19 declaration under Section 564(b)(1) of the Act, 21 U.S.C.section 360bbb-3(b)(1), unless the authorization is terminated  or revoked sooner.       Influenza A by PCR NEGATIVE NEGATIVE Final   Influenza B by PCR NEGATIVE NEGATIVE Final    Comment: (NOTE) The Xpert Xpress SARS-CoV-2/FLU/RSV plus assay is intended as an aid in the diagnosis of influenza from Nasopharyngeal swab specimens and should not be used as a sole basis for treatment. Nasal washings and aspirates are unacceptable for Xpert Xpress SARS-CoV-2/FLU/RSV testing.  Fact Sheet for Patients: EntrepreneurPulse.com.au  Fact Sheet for Healthcare Providers: IncredibleEmployment.be  This test is not yet approved or cleared by the [place] FDA and has been authorized for detection and/or diagnosis of SARS-CoV-2 by FDA under an Emergency Use Authorization (EUA). This EUA will remain in effect (meaning this test can be used) for the duration of the COVID-19 declaration under Section 564(b)(1) of the Act, 21 U.S.C. section 360bbb-3(b)(1), unless the authorization is terminated or revoked.     Resp Syncytial Virus by PCR NEGATIVE NEGATIVE Final    Comment: (NOTE) Fact Sheet for Patients: EntrepreneurPulse.com.au  Fact Sheet for Healthcare Providers: IncredibleEmployment.be  This test is not yet  approved or cleared by the [place] FDA and has been authorized for detection and/or diagnosis of SARS-CoV-2 by FDA under an Emergency Use Authorization (EUA). This EUA will remain in effect (meaning this test can be used) for the duration of the COVID-19 declaration under Section 564(b)(1) of the Act, 21 U.S.C. section 360bbb-3(b)(1), unless the authorization is terminated or revoked.  Performed at [facility], [place] [address] [place], [place] [address]         Radiology Studies: US Renal  Result Date: 10/27/2021 CLINICAL DATA:  Flank pain kidney stone. EXAM: RENAL / URINARY TRACT ULTRASOUND COMPLETE COMPARISON:  None. FINDINGS: Right Kidney: Renal measurements: 10.4 x 5.5 x 5.1 cm = volume: 0.2 mL. Echogenicity within normal limits. There is a 2.8 cm exophytic simple appearing cystic lesion. No solid mass. Moderate hydronephrosis visualized. Left Kidney: Renal measurements: 11.9 x 6.4 x 6.1 cm = volume: 241 mL. Echogenicity within normal limits. No solid mass. At least mild hydronephrosis visualized. Urinary bladder: Decompressed and not well visualized. Other: None. IMPRESSION: 1. Moderate right and mild left hydronephrosis. 2. Decompressed and not well visualized urinary bladder. Electronically Signed   By: Iven Finn M.D.   On: 10/27/2021 17:31   DG C-Arm 1-60 Min-No Report  Result Date:  10/27/2021 Fluoroscopy was utilized by the requesting physician.  No radiographic interpretation.        Scheduled Meds:  Chlorhexidine Gluconate Cloth  6 each Topical Daily   heparin  5,000 Units Subcutaneous Q8H   Continuous Infusions:   LOS: 0 days     Cordelia Poche, MD [facility] 10/28/2021, 1:19 PM  If 7PM-7AM, please contact night-coverage [email or web address]

## 2021-10-28 NOTE — Plan of Care (Signed)
  Problem: Clinical Measurements: Goal: Ability to maintain clinical measurements within normal limits will improve Outcome: Progressing   Problem: Activity: Goal: Risk for activity intolerance will decrease Outcome: Progressing   Problem: Pain Managment: Goal: General experience of comfort will improve Outcome: Progressing   

## 2021-10-28 NOTE — Assessment & Plan Note (Addendum)
Secondary to AKI. Given Lokelma with downtrend. Repeat BMP as an outpatient.

## 2021-10-28 NOTE — Assessment & Plan Note (Signed)
Uncertain etiology. Urology placed ureteral stent on 11/9.

## 2021-10-28 NOTE — Assessment & Plan Note (Addendum)
Baseline creatinine of about 1.4 - 1.6. Creatinine of 12.2 on admission secondary to acute left sided hydronephrosis. Creatinine improving quickly after ureteral stent placement. Creatinine of 2.44 on discharge with associated good urine output. Repeat BMP in 3-5 days.

## 2021-10-28 NOTE — Progress Notes (Signed)
Labs resulted this am. On call NP made aware of elevated labs   10/28/21 0617  Notify: Provider  Provider Name/Title J. Olena Heckle NP  Date Provider Notified 10/28/21  Time Provider Notified [phone]  Notification Type  (amion)  Notification Reason Other (Comment) (potassium 5.5 creatinie improved but still elevated 9.39 no fluids infusing)  Provider response No new orders  Date of Provider Response 10/28/21  Time of Provider Response [phone]

## 2021-10-28 NOTE — Progress Notes (Signed)
Urology Inpatient Progress Report  Urinary retention [R33.9] AKI (acute kidney injury) ([place]) [N17.9] Acute kidney injury superimposed on chronic kidney disease ([place]) [N17.9, N18.9]  Procedure(s): CYSTOSCOPY WITH RETROGRADE PYELOGRAM/URETERAL STENT PLACEMENT  1 Day Post-Op   Intv/Subj: Taken to the operating room last night for left ureteral stent placement.  Unable to cannulate the right ureter due to chronic obstruction and an infected stone.  Today the patient is without complaint, has no pain.  Is making a lot of urine.  Has some fatigue, but otherwise feels well.  Principal Problem:   Acute kidney injury superimposed on chronic kidney disease ([place]) Active Problems:   Right ureteral stone   Ureteral stricture, left  Current Facility-Administered Medications  Medication Dose Route Frequency Provider Last Rate Last Admin   acetaminophen (TYLENOL) tablet 650 mg  650 mg Oral Q6H PRN Lenore Cordia, MD   650 mg at 10/28/21 8676   Or   acetaminophen (TYLENOL) suppository 650 mg  650 mg Rectal Q6H PRN Lenore Cordia, MD       Chlorhexidine Gluconate Cloth 2 % PADS 6 each  6 each Topical Daily Lenore Cordia, MD   6 each at 10/28/21 0913   heparin injection 5,000 Units  5,000 Units Subcutaneous Q8H Patel, Vishal R, MD       hydrALAZINE (APRESOLINE) 20 MG/ML injection            ondansetron (ZOFRAN) tablet 4 mg  4 mg Oral Q6H PRN Lenore Cordia, MD       Or   ondansetron (ZOFRAN) injection 4 mg  4 mg Intravenous Q6H PRN Lenore Cordia, MD       senna-docusate (Senokot-S) tablet 1 tablet  1 tablet Oral QHS PRN Zada Finders R, MD       sodium chloride flush (NS) 0.9 % injection 10-40 mL  10-40 mL Intracatheter Q12H Patel, Vishal R, MD       sodium chloride flush (NS) 0.9 % injection 10-40 mL  10-40 mL Intracatheter PRN Lenore Cordia, MD         Objective: Vital: Vitals:   10/28/21 0055 10/28/21 0302 10/28/21 0500 10/28/21 0617  BP: (!) 142/78 138/82  (!) 144/96  Pulse: 84  85  75  Resp: 20 16  16   Temp: 97.7 F (36.5 C) (!) 97.5 F (36.4 C)  (!) 97.5 F (36.4 C)  TempSrc: Oral Oral  Oral  SpO2: 97% 96%  97%  Weight:   72.6 kg   Height:       I/Os: I/O last 3 completed shifts: In: 1831.9 [P.O.:30; I.V.:800; IV Piggyback:1001.9] Out: 2100 [Urine:2100]  Physical Exam:  General: Patient is in no apparent distress Lungs: Normal respiratory effort, chest expands symmetrically. GI: The abdomen is soft and nontender without mass. Ext: lower extremities symmetric  Lab Results: Recent Labs    10/27/21 1647 10/28/21 0351  WBC 9.2 10.7*  HGB 12.6* 13.5  HCT 38.1* 40.5   Recent Labs    10/27/21 1647 10/28/21 0351  NA 133* 135  K 4.8 5.5*  CL 100 105  CO2 17* 17*  GLUCOSE 110* 138*  BUN 93* 86*  CREATININE 12.20* 9.39*  CALCIUM 8.3* 8.7*   No results for input(s): LABPT, INR in the last 72 hours. No results for input(s): LABURIN in the last 72 hours. Results for orders placed or performed during the hospital encounter of 10/27/21  Resp Panel by RT-PCR (Flu A&B, Covid) Nasopharyngeal Swab     Status:  None   Collection Time: 10/27/21  6:28 PM   Specimen: Nasopharyngeal Swab; Nasopharyngeal(NP) swabs in vial transport medium  Result Value Ref Range Status   SARS Coronavirus 2 by RT PCR NEGATIVE NEGATIVE Final    Comment: (NOTE) SARS-CoV-2 target nucleic acids are NOT DETECTED.  The SARS-CoV-2 RNA is generally detectable in upper respiratory specimens during the acute phase of infection. The lowest concentration of SARS-CoV-2 viral copies this assay can detect is 138 copies/mL. A negative result does not preclude SARS-Cov-2 infection and should not be used as the sole basis for treatment or other patient management decisions. A negative result may occur with  improper specimen collection/handling, submission of specimen other than nasopharyngeal swab, presence of viral mutation(s) within the areas targeted by this assay, and inadequate  number of viral copies(<138 copies/mL). A negative result must be combined with clinical observations, patient history, and epidemiological information. The expected result is Negative.  Fact Sheet for Patients:  EntrepreneurPulse.com.au  Fact Sheet for Healthcare Providers:  IncredibleEmployment.be  This test is no t yet approved or cleared by the [place] FDA and  has been authorized for detection and/or diagnosis of SARS-CoV-2 by FDA under an Emergency Use Authorization (EUA). This EUA will remain  in effect (meaning this test can be used) for the duration of the COVID-19 declaration under Section 564(b)(1) of the Act, 21 U.S.C.section 360bbb-3(b)(1), unless the authorization is terminated  or revoked sooner.       Influenza A by PCR NEGATIVE NEGATIVE Final   Influenza B by PCR NEGATIVE NEGATIVE Final    Comment: (NOTE) The Xpert Xpress SARS-CoV-2/FLU/RSV plus assay is intended as an aid in the diagnosis of influenza from Nasopharyngeal swab specimens and should not be used as a sole basis for treatment. Nasal washings and aspirates are unacceptable for Xpert Xpress SARS-CoV-2/FLU/RSV testing.  Fact Sheet for Patients: EntrepreneurPulse.com.au  Fact Sheet for Healthcare Providers: IncredibleEmployment.be  This test is not yet approved or cleared by the [place] FDA and has been authorized for detection and/or diagnosis of SARS-CoV-2 by FDA under an Emergency Use Authorization (EUA). This EUA will remain in effect (meaning this test can be used) for the duration of the COVID-19 declaration under Section 564(b)(1) of the Act, 21 U.S.C. section 360bbb-3(b)(1), unless the authorization is terminated or revoked.  Performed at [facility], [address] [place], [place] [address]   Resp panel by RT-PCR (RSV, Flu A&B, Covid)     Status: None   Collection Time: 10/27/21  6:28 PM   Result Value Ref Range Status   SARS Coronavirus 2 by RT PCR NEGATIVE NEGATIVE Final    Comment: (NOTE) SARS-CoV-2 target nucleic acids are NOT DETECTED.  The SARS-CoV-2 RNA is generally detectable in upper respiratory specimens during the acute phase of infection. The lowest concentration of SARS-CoV-2 viral copies this assay can detect is 138 copies/mL. A negative result does not preclude SARS-Cov-2 infection and should not be used as the sole basis for treatment or other patient management decisions. A negative result may occur with  improper specimen collection/handling, submission of specimen other than nasopharyngeal swab, presence of viral mutation(s) within the areas targeted by this assay, and inadequate number of viral copies(<138 copies/mL). A negative result must be combined with clinical observations, patient history, and epidemiological information. The expected result is Negative.  Fact Sheet for Patients:  EntrepreneurPulse.com.au  Fact Sheet for Healthcare Providers:  IncredibleEmployment.be  This test is no t yet approved or cleared by  the [employer] and  has been authorized for detection and/or diagnosis of SARS-CoV-2 by FDA under an Emergency Use Authorization (EUA). This EUA will remain  in effect (meaning this test can be used) for the duration of the COVID-19 declaration under Section 564(b)(1) of the Act, 21 U.S.C.section 360bbb-3(b)(1), unless the authorization is terminated  or revoked sooner.       Influenza A by PCR NEGATIVE NEGATIVE Final   Influenza B by PCR NEGATIVE NEGATIVE Final    Comment: (NOTE) The Xpert Xpress SARS-CoV-2/FLU/RSV plus assay is intended as an aid in the diagnosis of influenza from Nasopharyngeal swab specimens and should not be used as a sole basis for treatment. Nasal washings and aspirates are unacceptable for Xpert Xpress SARS-CoV-2/FLU/RSV testing.  Fact Sheet for  Patients: EntrepreneurPulse.com.au  Fact Sheet for Healthcare Providers: IncredibleEmployment.be  This test is not yet approved or cleared by the [place] FDA and has been authorized for detection and/or diagnosis of SARS-CoV-2 by FDA under an Emergency Use Authorization (EUA). This EUA will remain in effect (meaning this test can be used) for the duration of the COVID-19 declaration under Section 564(b)(1) of the Act, 21 U.S.C. section 360bbb-3(b)(1), unless the authorization is terminated or revoked.     Resp Syncytial Virus by PCR NEGATIVE NEGATIVE Final    Comment: (NOTE) Fact Sheet for Patients: EntrepreneurPulse.com.au  Fact Sheet for Healthcare Providers: IncredibleEmployment.be  This test is not yet approved or cleared by the [place] FDA and has been authorized for detection and/or diagnosis of SARS-CoV-2 by FDA under an Emergency Use Authorization (EUA). This EUA will remain in effect (meaning this test can be used) for the duration of the COVID-19 declaration under Section 564(b)(1) of the Act, 21 U.S.C. section 360bbb-3(b)(1), unless the authorization is terminated or revoked.  Performed at [facility], [place] [address], [place], [place] [address]     Studies/Results: US Renal  Result Date: 10/27/2021 CLINICAL DATA:  Flank pain kidney stone. EXAM: RENAL / URINARY TRACT ULTRASOUND COMPLETE COMPARISON:  None. FINDINGS: Right Kidney: Renal measurements: 10.4 x 5.5 x 5.1 cm = volume: 0.2 mL. Echogenicity within normal limits. There is a 2.8 cm exophytic simple appearing cystic lesion. No solid mass. Moderate hydronephrosis visualized. Left Kidney: Renal measurements: 11.9 x 6.4 x 6.1 cm = volume: 241 mL. Echogenicity within normal limits. No solid mass. At least mild hydronephrosis visualized. Urinary bladder: Decompressed and not well visualized. Other: None. IMPRESSION:  1. Moderate right and mild left hydronephrosis. 2. Decompressed and not well visualized urinary bladder. Electronically Signed   By: Iven Finn M.D.   On: 10/27/2021 17:31   DG C-Arm 1-60 Min-No Report  Result Date: 10/27/2021 Fluoroscopy was utilized by the requesting physician.  No radiographic interpretation.    Assessment: Procedure(s): CYSTOSCOPY WITH RETROGRADE PYELOGRAM/URETERAL STENT PLACEMENT, 1 Day Post-Op  doing well.  Acute on chronic renal failure from obstruction, renal function has started to improve, potassium level reassuring.  Likely has developed a bit of postobstructive diuresis.  Plan: I had a long discussion with the patient today our plan is to follow his renal function closely, which I assume will return close to his baseline over the coming days.  We will then plan for him to return to the operating room on Friday in 1 week as planned.  We will perform diagnostic ureteroscopy on the left see if we can ascertain any explanation for his obstruction on that side.  We will also attempt to open up  the right side with ureteroscopy, laser lithotripsy.  We will plan to follow along as needed.  Please page or contact for any additional questions or concerns.   Louis Meckel, MD Urology 10/28/2021, 10:58 AM

## 2021-10-28 NOTE — Assessment & Plan Note (Signed)
Ureteral stent placed for treatment as mentioned above

## 2021-10-29 DIAGNOSIS — N1330 Unspecified hydronephrosis: Secondary | ICD-10-CM | POA: Diagnosis not present

## 2021-10-29 DIAGNOSIS — E872 Acidosis, unspecified: Secondary | ICD-10-CM

## 2021-10-29 DIAGNOSIS — N179 Acute kidney failure, unspecified: Secondary | ICD-10-CM | POA: Diagnosis not present

## 2021-10-29 DIAGNOSIS — E875 Hyperkalemia: Secondary | ICD-10-CM | POA: Diagnosis not present

## 2021-10-29 LAB — BASIC METABOLIC PANEL
Anion gap: 10 (ref 5–15)
BUN: 68 mg/dL — ABNORMAL HIGH (ref 8–23)
CO2: 20 mmol/L — ABNORMAL LOW (ref 22–32)
Calcium: 8.9 mg/dL (ref 8.9–10.3)
Chloride: 108 mmol/L (ref 98–111)
Creatinine, Ser: 3.89 mg/dL — ABNORMAL HIGH (ref 0.61–1.24)
GFR, Estimated: 15 mL/min — ABNORMAL LOW (ref >60)
Glucose, Bld: 123 mg/dL — ABNORMAL HIGH (ref 70–99)
Potassium: 5.4 mmol/L — ABNORMAL HIGH (ref 3.5–5.1)
Sodium: 138 mmol/L (ref 135–145)

## 2021-10-29 MED ORDER — SODIUM ZIRCONIUM CYCLOSILICATE 5 G PO PACK
5.0000 g | PACK | Freq: Once | ORAL | Status: AC
  Administered 2021-10-29: 5 g via ORAL
  Filled 2021-10-29: qty 1

## 2021-10-29 NOTE — Progress Notes (Addendum)
PROGRESS NOTE    Kevin Villegas  LYY:[id number] DOB: 01-Mar-1941 DOA: 10/27/2021 PCP: Dettinger, [name], MD   Brief Narrative: Kevin Villegas is a 80 y.o. male with a history of metastatic pancreatic cancer, distal bowel obstruction s/p diverting colostomy, CKD stage IIIa, chronic obstructing right ureteral stone. Patient presented secondary to urinary retention. Urology consulted. Patient underwent left ureteral stent placement for moderate-severe left hydronephrosis of uncertain etiology. He has an associated AKI which is improving.   Assessment & Plan:   * Acute kidney injury superimposed on chronic kidney disease ([facility]) Baseline creatinine of about 1.4 - 1.6. Creatinine of 12.2 on admission secondary to acute left sided hydronephrosis. Creatinine improving quickly after ureteral stent placement. -Daily BMP -Strict in/out  Right ureteral stone Chronic.  Hydronephrosis of left kidney Ureteral stent placed for treatment as mentioned above  Ureteral stricture, left Uncertain etiology. Urology placed ureteral stent on 11/9.  Metabolic acidosis Secondary to AKI. Improving.  Hyperkalemia Secondary to AKI. Stable. -Lokelma 10 g x1   DVT prophylaxis: Heparin subq Code Status:   Code Status: Full Code Family Communication: None at bedside Disposition Plan: Discharge home likely in 24 hours pending continued improvement of creatinine     Consultants:  Urology   Procedures:  CYSTOSCOPY WITH RETROGRADE PYELOGRAM/URETERAL STENT PLACEMENT OF LEFT URETER (10/27/2021)   Antimicrobials: None      Subjective: Urinating well. No issues overnight.  Objective: Vitals:   10/29/21 0424 10/29/21 0500 10/29/21 1121 10/29/21 1123  BP: (!) 168/99  (!) 162/91 (!) 158/84  Pulse: 69  75   Resp: 18  16   Temp: 98.4 F (36.9 C)  98.1 F (36.7 C)   TempSrc: Oral  Oral   SpO2: 96%  97%   Weight:  65.6 kg    Height:        Intake/Output Summary (Last 24 hours) at 10/29/2021  1228 Last data filed at 10/29/2021 1125 Gross per 24 hour  Intake 900 ml  Output 3200 ml  Net -2300 ml    Filed Weights   10/28/21 0030 10/28/21 0500 10/29/21 0500  Weight: 73.3 kg 72.6 kg 65.6 kg    Examination:  General exam: Appears calm and comfortable  Respiratory system: Clear to auscultation. Respiratory effort normal. Cardiovascular system: S1 & S2 heard, RRR. No murmurs, rubs, gallops or clicks. Gastrointestinal system: Abdomen is nondistended, soft and nontender. No organomegaly or masses felt. Normal bowel sounds heard. Central nervous system: Alert and oriented. No focal neurological deficits. Musculoskeletal: No edema. No calf tenderness Skin: No cyanosis. No rashes Psychiatry: Judgement and insight appear normal. Mood & affect appropriate.    Data Reviewed: I have personally reviewed following labs and imaging studies  CBC Lab Results  Component Value Date   WBC 10.7 (H) 10/28/2021   RBC 4.48 10/28/2021   HGB 13.5 10/28/2021   HCT 40.5 10/28/2021   MCV 90.4 10/28/2021   MCH 30.1 10/28/2021   PLT 591 (H) 10/28/2021   MCHC 33.3 10/28/2021   RDW 13.2 10/28/2021   LYMPHSABS 0.8 10/27/2021   MONOABS 1.2 (H) 10/27/2021   EOSABS 0.1 10/27/2021   BASOSABS 0.0 12/75/1700     Last metabolic panel Lab Results  Component Value Date   NA 138 10/29/2021   K 5.4 (H) 10/29/2021   CL 108 10/29/2021   CO2 20 (L) 10/29/2021   BUN 68 (H) 10/29/2021   CREATININE 3.89 (H) 10/29/2021   GLUCOSE 123 (H) 10/29/2021   GFRNONAA 15 (L) 10/29/2021   GFRAA >  60 11/24/2019   CALCIUM 8.9 10/29/2021   PHOS 3.1 06/16/2021   PROT 7.0 10/27/2021   ALBUMIN 3.1 (L) 10/27/2021   LABGLOB 3.1 10/13/2021   AGRATIO 1.4 10/13/2021   BILITOT 0.3 10/27/2021   ALKPHOS 50 10/27/2021   AST 13 (L) 10/27/2021   ALT 16 10/27/2021   ANIONGAP 10 10/29/2021    CBG (last 3)  No results for input(s): GLUCAP in the last 72 hours.   GFR: Estimated Creatinine Clearance: 14.3 mL/min (A) (by  C-G formula based on SCr of 3.89 mg/dL (H)).  Coagulation Profile: No results for input(s): INR, PROTIME in the last 168 hours.  Recent Results (from the past 240 hour(s))  Resp Panel by RT-PCR (Flu A&B, Covid) Nasopharyngeal Swab     Status: None   Collection Time: 10/27/21  6:28 PM   Specimen: Nasopharyngeal Swab; Nasopharyngeal(NP) swabs in vial transport medium  Result Value Ref Range Status   SARS Coronavirus 2 by RT PCR NEGATIVE NEGATIVE Final    Comment: (NOTE) SARS-CoV-2 target nucleic acids are NOT DETECTED.  The SARS-CoV-2 RNA is generally detectable in upper respiratory specimens during the acute phase of infection. The lowest concentration of SARS-CoV-2 viral copies this assay can detect is 138 copies/mL. A negative result does not preclude SARS-Cov-2 infection and should not be used as the sole basis for treatment or other patient management decisions. A negative result may occur with  improper specimen collection/handling, submission of specimen other than nasopharyngeal swab, presence of viral mutation(s) within the areas targeted by this assay, and inadequate number of viral copies(<138 copies/mL). A negative result must be combined with clinical observations, patient history, and epidemiological information. The expected result is Negative.  Fact Sheet for Patients:  EntrepreneurPulse.com.au  Fact Sheet for Healthcare Providers:  IncredibleEmployment.be  This test is no t yet approved or cleared by the [place] FDA and  has been authorized for detection and/or diagnosis of SARS-CoV-2 by FDA under an Emergency Use Authorization (EUA). This EUA will remain  in effect (meaning this test can be used) for the duration of the COVID-19 declaration under Section 564(b)(1) of the Act, 21 U.S.C.section 360bbb-3(b)(1), unless the authorization is terminated  or revoked sooner.       Influenza A by PCR NEGATIVE NEGATIVE Final    Influenza B by PCR NEGATIVE NEGATIVE Final    Comment: (NOTE) The Xpert Xpress SARS-CoV-2/FLU/RSV plus assay is intended as an aid in the diagnosis of influenza from Nasopharyngeal swab specimens and should not be used as a sole basis for treatment. Nasal washings and aspirates are unacceptable for Xpert Xpress SARS-CoV-2/FLU/RSV testing.  Fact Sheet for Patients: EntrepreneurPulse.com.au  Fact Sheet for Healthcare Providers: IncredibleEmployment.be  This test is not yet approved or cleared by the [place] FDA and has been authorized for detection and/or diagnosis of SARS-CoV-2 by FDA under an Emergency Use Authorization (EUA). This EUA will remain in effect (meaning this test can be used) for the duration of the COVID-19 declaration under Section 564(b)(1) of the Act, 21 U.S.C. section 360bbb-3(b)(1), unless the authorization is terminated or revoked.  Performed at [facility], [address] [place], [place] [address]   Resp panel by RT-PCR (RSV, Flu A&B, Covid)     Status: None   Collection Time: 10/27/21  6:28 PM  Result Value Ref Range Status   SARS Coronavirus 2 by RT PCR NEGATIVE NEGATIVE Final    Comment: (NOTE) SARS-CoV-2 target nucleic acids are NOT DETECTED.  The SARS-CoV-2  RNA is generally detectable in upper respiratory specimens during the acute phase of infection. The lowest concentration of SARS-CoV-2 viral copies this assay can detect is 138 copies/mL. A negative result does not preclude SARS-Cov-2 infection and should not be used as the sole basis for treatment or other patient management decisions. A negative result may occur with  improper specimen collection/handling, submission of specimen other than nasopharyngeal swab, presence of viral mutation(s) within the areas targeted by this assay, and inadequate number of viral copies(<138 copies/mL). A negative result must be combined with clinical  observations, patient history, and epidemiological information. The expected result is Negative.  Fact Sheet for Patients:  EntrepreneurPulse.com.au  Fact Sheet for Healthcare Providers:  IncredibleEmployment.be  This test is no t yet approved or cleared by the [place] FDA and  has been authorized for detection and/or diagnosis of SARS-CoV-2 by FDA under an Emergency Use Authorization (EUA). This EUA will remain  in effect (meaning this test can be used) for the duration of the COVID-19 declaration under Section 564(b)(1) of the Act, 21 U.S.C.section 360bbb-3(b)(1), unless the authorization is terminated  or revoked sooner.       Influenza A by PCR NEGATIVE NEGATIVE Final   Influenza B by PCR NEGATIVE NEGATIVE Final    Comment: (NOTE) The Xpert Xpress SARS-CoV-2/FLU/RSV plus assay is intended as an aid in the diagnosis of influenza from Nasopharyngeal swab specimens and should not be used as a sole basis for treatment. Nasal washings and aspirates are unacceptable for Xpert Xpress SARS-CoV-2/FLU/RSV testing.  Fact Sheet for Patients: EntrepreneurPulse.com.au  Fact Sheet for Healthcare Providers: IncredibleEmployment.be  This test is not yet approved or cleared by the [place] FDA and has been authorized for detection and/or diagnosis of SARS-CoV-2 by FDA under an Emergency Use Authorization (EUA). This EUA will remain in effect (meaning this test can be used) for the duration of the COVID-19 declaration under Section 564(b)(1) of the Act, 21 U.S.C. section 360bbb-3(b)(1), unless the authorization is terminated or revoked.     Resp Syncytial Virus by PCR NEGATIVE NEGATIVE Final    Comment: (NOTE) Fact Sheet for Patients: EntrepreneurPulse.com.au  Fact Sheet for Healthcare Providers: IncredibleEmployment.be  This test is not yet approved or cleared by  the [place] FDA and has been authorized for detection and/or diagnosis of SARS-CoV-2 by FDA under an Emergency Use Authorization (EUA). This EUA will remain in effect (meaning this test can be used) for the duration of the COVID-19 declaration under Section 564(b)(1) of the Act, 21 U.S.C. section 360bbb-3(b)(1), unless the authorization is terminated or revoked.  Performed at [facility], [place] [address] [place], [place] [address]         Radiology Studies: US Renal  Result Date: 10/27/2021 CLINICAL DATA:  Flank pain kidney stone. EXAM: RENAL / URINARY TRACT ULTRASOUND COMPLETE COMPARISON:  None. FINDINGS: Right Kidney: Renal measurements: 10.4 x 5.5 x 5.1 cm = volume: 0.2 mL. Echogenicity within normal limits. There is a 2.8 cm exophytic simple appearing cystic lesion. No solid mass. Moderate hydronephrosis visualized. Left Kidney: Renal measurements: 11.9 x 6.4 x 6.1 cm = volume: 241 mL. Echogenicity within normal limits. No solid mass. At least mild hydronephrosis visualized. Urinary bladder: Decompressed and not well visualized. Other: None. IMPRESSION: 1. Moderate right and mild left hydronephrosis. 2. Decompressed and not well visualized urinary bladder. Electronically Signed   By: Iven Finn M.D.   On: 10/27/2021 17:31   DG C-Arm 1-60 Min-No Report  Result Date: 10/27/2021  Fluoroscopy was utilized by the requesting physician.  No radiographic interpretation.        Scheduled Meds:  Chlorhexidine Gluconate Cloth  6 each Topical Daily   Continuous Infusions:   LOS: 1 day     Cordelia Poche, MD [facility] 10/29/2021, 12:28 PM  If 7PM-7AM, please contact night-coverage [email or web address]

## 2021-10-29 NOTE — Anesthesia Postprocedure Evaluation (Signed)
Anesthesia Post Note  Patient: Kevin Villegas  Procedure(s) Performed: CYSTOSCOPY WITH RETROGRADE PYELOGRAM/URETERAL STENT PLACEMENT (Left: Ureter)     Patient location during evaluation: PACU Anesthesia Type: General Level of consciousness: awake Pain management: pain level controlled Vital Signs Assessment: post-procedure vital signs reviewed and stable Respiratory status: spontaneous breathing, nonlabored ventilation, respiratory function stable and patient connected to nasal cannula oxygen Cardiovascular status: blood pressure returned to baseline and stable Postop Assessment: no apparent nausea or vomiting Anesthetic complications: no   No notable events documented.  Last Vitals:  Vitals:   10/28/21 2300 10/29/21 0424  BP: (!) 180/104 (!) 168/99  Pulse:  69  Resp:  18  Temp:  36.9 C  SpO2:  96%    Last Pain:  Vitals:   10/29/21 0424  TempSrc: Oral  PainSc:                  Kevin Villegas

## 2021-10-29 NOTE — Assessment & Plan Note (Addendum)
Secondary to AKI. Resolved.

## 2021-10-30 DIAGNOSIS — N179 Acute kidney failure, unspecified: Secondary | ICD-10-CM | POA: Diagnosis not present

## 2021-10-30 DIAGNOSIS — N189 Chronic kidney disease, unspecified: Secondary | ICD-10-CM | POA: Diagnosis not present

## 2021-10-30 DIAGNOSIS — I10 Essential (primary) hypertension: Secondary | ICD-10-CM

## 2021-10-30 LAB — BASIC METABOLIC PANEL
Anion gap: 9 (ref 5–15)
BUN: 56 mg/dL — ABNORMAL HIGH (ref 8–23)
CO2: 22 mmol/L (ref 22–32)
Calcium: 9.4 mg/dL (ref 8.9–10.3)
Chloride: 107 mmol/L (ref 98–111)
Creatinine, Ser: 2.44 mg/dL — ABNORMAL HIGH (ref 0.61–1.24)
GFR, Estimated: 26 mL/min — ABNORMAL LOW (ref >60)
Glucose, Bld: 102 mg/dL — ABNORMAL HIGH (ref 70–99)
Potassium: 5.3 mmol/L — ABNORMAL HIGH (ref 3.5–5.1)
Sodium: 138 mmol/L (ref 135–145)

## 2021-10-30 MED ORDER — POLYETHYLENE GLYCOL 3350 17 G PO PACK: 17.0000 g | PACK | Freq: Every day | ORAL | Status: DC

## 2021-10-30 MED ORDER — POLYETHYLENE GLYCOL 3350 17 G PO PACK
17.0000 g | PACK | Freq: Every day | ORAL | Status: DC
  Administered 2021-10-30: 17 g via ORAL
  Filled 2021-10-30: qty 1

## 2021-10-30 MED ORDER — HEPARIN SOD (PORK) LOCK FLUSH 100 UNIT/ML IV SOLN
500.0000 [IU] | INTRAVENOUS | Status: AC | PRN
  Administered 2021-10-30: 500 [IU]
  Filled 2021-10-30: qty 5

## 2021-10-30 MED ORDER — SODIUM ZIRCONIUM CYCLOSILICATE 10 G PO PACK
10.0000 g | PACK | Freq: Once | ORAL | Status: AC
  Administered 2021-10-30: 10 g via ORAL
  Filled 2021-10-30: qty 1

## 2021-10-30 NOTE — Assessment & Plan Note (Signed)
Uncontrolled blood pressure while inpatient. Managed with hydralazine as needed. Discussed with patient the need for him to start antihypertensive therapy, but he has decided to defer treatment until he is able to follow-up with his primary care physician.

## 2021-10-30 NOTE — Discharge Summary (Signed)
Physician Discharge Summary  Kevin Villegas SJG:[id number] DOB: 1940-12-31 DOA: 10/27/2021  PCP: Dettinger, [name], MD  Admit date: 10/27/2021 Discharge date: 10/30/2021  Admitted From: Home Disposition: Home  Recommendations for Outpatient Follow-up:  Follow up with PCP in 1 week Please obtain BMP in 3-5 days Please follow up on the following pending results: None  Home Health: None Equipment/Devices: None  Discharge Condition: Stable CODE STATUS: Full code Diet recommendation: Regular/heart healthy diet   Brief/Interim Summary:  Admission HPI written by Zada Finders, MD  HPI: Kevin Villegas is a 80 y.o. male with medical history significant for metastatic pancreatic cancer complicated by distal bowel obstruction s/p diverting colectomy, CKD stage IIIa who presented to the ED for evaluation of urinary retention.  Saw his urologist on 11/8.  CT urogram was seen and showed a stable 6 mm distal right ureteral calculus with severe right hydroureteronephrosis.  Increased moderate to severe left hydronephrosis was seen without ureteral calculi or other obstructing etiology apparent on exam.  Stable ill-defined soft tissue mass in area of pancreatic body abutting the duodenal stent, stable bibasilar pulmonary nodules, new small left pleural effusion, colon diverticulosis, stable mildly large prostate all noted.  Patient presented to the ED for evaluation of urinary retention over the last 24 hours.  He otherwise has not been having any abdominal pain, nausea, vomiting.  He reports good output from his colostomy.  Patient seen in PACU after left ureteral stent placed.  He is having urinary urgency but no urine output yet.  He states that he is not currently taking any medications at home including NSAIDs.   Hospital course:  * Acute kidney injury superimposed on chronic kidney disease ([place]) Baseline creatinine of about 1.4 - 1.6. Creatinine of 12.2 on admission secondary to acute  left sided hydronephrosis. Creatinine improving quickly after ureteral stent placement. Creatinine of 2.44 on discharge with associated good urine output. Repeat BMP in 3-5 days.  Right ureteral stone Chronic. Plan for outpatient lithotripsy per urology  Hydronephrosis of left kidney Ureteral stent placed for treatment as mentioned above  Ureteral stricture, left Uncertain etiology. Urology placed ureteral stent on 11/9.  Essential hypertension Uncontrolled blood pressure while inpatient. Managed with hydralazine as needed. Discussed with patient the need for him to start antihypertensive therapy, but he has decided to defer treatment until he is able to follow-up with his primary care physician.  Hyperkalemia Secondary to AKI. Given Lokelma with downtrend. Repeat BMP as an outpatient.  Metabolic acidosis-resolved as of 10/30/2021 Secondary to AKI. Resolved.     Discharge Instructions   Allergies as of 10/30/2021       Reactions   Pollen Extract Other (See Comments)   Sinus congestion        Medication List     TAKE these medications    polyethylene glycol 17 g packet Commonly known as: MIRALAX / GLYCOLAX Take 17 g by mouth daily. Start taking on: October 31, 2021        Follow-up Information     Dettinger, [name], MD. Schedule an appointment as soon as possible for a visit in 1 week(s).   Specialties: Family Medicine, Cardiology Why: For hospital follow-up. Repeat metabolic panel Contact information: [place] [address] [phone]                Allergies  Allergen Reactions   Pollen Extract Other (See Comments)    Sinus congestion    Consultations: Urology   Procedures/Studies: US  Renal  Result Date: 10/27/2021 CLINICAL DATA:  Flank pain kidney stone. EXAM: RENAL / URINARY TRACT ULTRASOUND COMPLETE COMPARISON:  None. FINDINGS: Right Kidney: Renal measurements: 10.4 x 5.5 x 5.1 cm = volume: 0.2 mL. Echogenicity within  normal limits. There is a 2.8 cm exophytic simple appearing cystic lesion. No solid mass. Moderate hydronephrosis visualized. Left Kidney: Renal measurements: 11.9 x 6.4 x 6.1 cm = volume: 241 mL. Echogenicity within normal limits. No solid mass. At least mild hydronephrosis visualized. Urinary bladder: Decompressed and not well visualized. Other: None. IMPRESSION: 1. Moderate right and mild left hydronephrosis. 2. Decompressed and not well visualized urinary bladder. Electronically Signed   By: Iven Finn M.D.   On: 10/27/2021 17:31   DG Abd 2 Views  Result Date: 10/15/2021 CLINICAL DATA:  Soft tissue mass in right lower abdomen, superficial. EXAM: ABDOMEN - 2 VIEW COMPARISON:  Abdominopelvic CT 07/13/2021 FINDINGS: No free intra-abdominal air. No bowel dilatation to suggest obstruction. Multiple chain sutures throughout the abdomen and pelvis. Stent in the left upper quadrant is unchanged in positioning, the duodenum on prior CT. Left-sided ostomy is not well evaluated by radiograph. Formed stool in the colon. No bowel dilatation or evidence of obstruction. Cholecystectomy clips in the right upper quadrant. The suprapubic catheter in prior CT is not confidently identified. No radiographic findings of superficial soft tissue mass, although limited radiographic assessment of the soft tissues on provided views. No acute osseous abnormalities are seen. IMPRESSION: 1. Normal bowel gas pattern. No free air. 2. Unchanged appearance of stent in the left upper quadrant, in the duodenum on prior CT. 3. Superficial soft tissues are not well assessed by radiograph, consider ultrasound or CT for further assessment based on clinical concern. Electronically Signed   By: Keith Rake M.D.   On: 10/15/2021 21:12   DG C-Arm 1-60 Min-No Report  Result Date: 10/27/2021 Fluoroscopy was utilized by the requesting physician.  No radiographic interpretation.     Subjective: No concerns this morning  Discharge  Exam: Vitals:   10/29/21 2038 10/30/21 0508  BP: (!) 160/98 (!) 152/102  Pulse: 74 68  Resp: 17 16  Temp: 98.2 F (36.8 C) 97.7 F (36.5 C)  SpO2: 96% 96%   Vitals:   10/29/21 1121 10/29/21 1123 10/29/21 2038 10/30/21 0508  BP: (!) 162/91 (!) 158/84 (!) 160/98 (!) 152/102  Pulse: 75  74 68  Resp: 16  17 16   Temp: 98.1 F (36.7 C)  98.2 F (36.8 C) 97.7 F (36.5 C)  TempSrc: Oral     SpO2: 97%  96% 96%  Weight:    64.7 kg  Height:        General: Pt is alert, awake, not in acute distress Cardiovascular: RRR, S1/S2 +, no rubs, no gallops Respiratory: CTA bilaterally, no wheezing, no rhonchi Abdominal: Soft, NT, ND, bowel sounds +. Firm subcostal right sided mass Extremities: no edema, no cyanosis    The results of significant diagnostics from this hospitalization (including imaging, microbiology, ancillary and laboratory) are listed below for reference.     Microbiology: Recent Results (from the past 240 hour(s))  Resp Panel by RT-PCR (Flu A&B, Covid) Nasopharyngeal Swab     Status: None   Collection Time: 10/27/21  6:28 PM   Specimen: Nasopharyngeal Swab; Nasopharyngeal(NP) swabs in vial transport medium  Result Value Ref Range Status   SARS Coronavirus 2 by RT PCR NEGATIVE NEGATIVE Final    Comment: (NOTE) SARS-CoV-2 target nucleic acids are NOT DETECTED.  The SARS-CoV-2  RNA is generally detectable in upper respiratory specimens during the acute phase of infection. The lowest concentration of SARS-CoV-2 viral copies this assay can detect is 138 copies/mL. A negative result does not preclude SARS-Cov-2 infection and should not be used as the sole basis for treatment or other patient management decisions. A negative result may occur with  improper specimen collection/handling, submission of specimen other than nasopharyngeal swab, presence of viral mutation(s) within the areas targeted by this assay, and inadequate number of viral copies(<138 copies/mL). A  negative result must be combined with clinical observations, patient history, and epidemiological information. The expected result is Negative.  Fact Sheet for Patients:  EntrepreneurPulse.com.au  Fact Sheet for Healthcare Providers:  IncredibleEmployment.be  This test is no t yet approved or cleared by the [place] FDA and  has been authorized for detection and/or diagnosis of SARS-CoV-2 by FDA under an Emergency Use Authorization (EUA). This EUA will remain  in effect (meaning this test can be used) for the duration of the COVID-19 declaration under Section 564(b)(1) of the Act, 21 U.S.C.section 360bbb-3(b)(1), unless the authorization is terminated  or revoked sooner.       Influenza A by PCR NEGATIVE NEGATIVE Final   Influenza B by PCR NEGATIVE NEGATIVE Final    Comment: (NOTE) The Xpert Xpress SARS-CoV-2/FLU/RSV plus assay is intended as an aid in the diagnosis of influenza from Nasopharyngeal swab specimens and should not be used as a sole basis for treatment. Nasal washings and aspirates are unacceptable for Xpert Xpress SARS-CoV-2/FLU/RSV testing.  Fact Sheet for Patients: EntrepreneurPulse.com.au  Fact Sheet for Healthcare Providers: IncredibleEmployment.be  This test is not yet approved or cleared by the [place] FDA and has been authorized for detection and/or diagnosis of SARS-CoV-2 by FDA under an Emergency Use Authorization (EUA). This EUA will remain in effect (meaning this test can be used) for the duration of the COVID-19 declaration under Section 564(b)(1) of the Act, 21 U.S.C. section 360bbb-3(b)(1), unless the authorization is terminated or revoked.  Performed at [facility], [address] [place], [place] [address]   Resp panel by RT-PCR (RSV, Flu A&B, Covid)     Status: None   Collection Time: 10/27/21  6:28 PM  Result Value Ref Range Status   SARS  Coronavirus 2 by RT PCR NEGATIVE NEGATIVE Final    Comment: (NOTE) SARS-CoV-2 target nucleic acids are NOT DETECTED.  The SARS-CoV-2 RNA is generally detectable in upper respiratory specimens during the acute phase of infection. The lowest concentration of SARS-CoV-2 viral copies this assay can detect is 138 copies/mL. A negative result does not preclude SARS-Cov-2 infection and should not be used as the sole basis for treatment or other patient management decisions. A negative result may occur with  improper specimen collection/handling, submission of specimen other than nasopharyngeal swab, presence of viral mutation(s) within the areas targeted by this assay, and inadequate number of viral copies(<138 copies/mL). A negative result must be combined with clinical observations, patient history, and epidemiological information. The expected result is Negative.  Fact Sheet for Patients:  EntrepreneurPulse.com.au  Fact Sheet for Healthcare Providers:  IncredibleEmployment.be  This test is no t yet approved or cleared by the [place] FDA and  has been authorized for detection and/or diagnosis of SARS-CoV-2 by FDA under an Emergency Use Authorization (EUA). This EUA will remain  in effect (meaning this test can be used) for the duration of the COVID-19 declaration under Section 564(b)(1) of the Act, 21 U.S.C.section 360bbb-3(b)(1),  unless the authorization is terminated  or revoked sooner.       Influenza A by PCR NEGATIVE NEGATIVE Final   Influenza B by PCR NEGATIVE NEGATIVE Final    Comment: (NOTE) The Xpert Xpress SARS-CoV-2/FLU/RSV plus assay is intended as an aid in the diagnosis of influenza from Nasopharyngeal swab specimens and should not be used as a sole basis for treatment. Nasal washings and aspirates are unacceptable for Xpert Xpress SARS-CoV-2/FLU/RSV testing.  Fact Sheet for  Patients: EntrepreneurPulse.com.au  Fact Sheet for Healthcare Providers: IncredibleEmployment.be  This test is not yet approved or cleared by the [place] FDA and has been authorized for detection and/or diagnosis of SARS-CoV-2 by FDA under an Emergency Use Authorization (EUA). This EUA will remain in effect (meaning this test can be used) for the duration of the COVID-19 declaration under Section 564(b)(1) of the Act, 21 U.S.C. section 360bbb-3(b)(1), unless the authorization is terminated or revoked.     Resp Syncytial Virus by PCR NEGATIVE NEGATIVE Final    Comment: (NOTE) Fact Sheet for Patients: EntrepreneurPulse.com.au  Fact Sheet for Healthcare Providers: IncredibleEmployment.be  This test is not yet approved or cleared by the [place] FDA and has been authorized for detection and/or diagnosis of SARS-CoV-2 by FDA under an Emergency Use Authorization (EUA). This EUA will remain in effect (meaning this test can be used) for the duration of the COVID-19 declaration under Section 564(b)(1) of the Act, 21 U.S.C. section 360bbb-3(b)(1), unless the authorization is terminated or revoked.  Performed at [facility], [place] [address] [place], [place] [address]      Labs: BNP (last 3 results) No results for input(s): BNP in the last 8760 hours. Basic Metabolic Panel: Recent Labs  Lab 10/27/21 1647 10/28/21 0351 10/28/21 1857 10/29/21 0510 10/30/21 0450  NA 133* 135  --  138 138  K 4.8 5.5* 5.6* 5.4* 5.3*  CL 100 105  --  108 107  CO2 17* 17*  --  20* 22  GLUCOSE 110* 138*  --  123* 102*  BUN 93* 86*  --  68* 56*  CREATININE 12.20* 9.39*  --  3.89* 2.44*  CALCIUM 8.3* 8.7*  --  8.9 9.4   Liver Function Tests: Recent Labs  Lab 10/27/21 1647  AST 13*  ALT 16  ALKPHOS 50  BILITOT 0.3  PROT 7.0  ALBUMIN 3.1*   No results for input(s): LIPASE, AMYLASE in the  last 168 hours. No results for input(s): AMMONIA in the last 168 hours. CBC: Recent Labs  Lab 10/27/21 1647 10/28/21 0351  WBC 9.2 10.7*  NEUTROABS 7.0  --   HGB 12.6* 13.5  HCT 38.1* 40.5  MCV 91.1 90.4  PLT 529* 591*   Cardiac Enzymes: No results for input(s): CKTOTAL, CKMB, CKMBINDEX, TROPONINI in the last 168 hours. BNP: Invalid input(s): POCBNP CBG: No results for input(s): GLUCAP in the last 168 hours. D-Dimer No results for input(s): DDIMER in the last 72 hours. Hgb A1c No results for input(s): HGBA1C in the last 72 hours. Lipid Profile No results for input(s): CHOL, HDL, LDLCALC, TRIG, CHOLHDL, LDLDIRECT in the last 72 hours. Thyroid function studies No results for input(s): TSH, T4TOTAL, T3FREE, THYROIDAB in the last 72 hours.  Invalid input(s): FREET3 Anemia work up No results for input(s): VITAMINB12, FOLATE, FERRITIN, TIBC, IRON, RETICCTPCT in the last 72 hours. Urinalysis    Component Value Date/Time   COLORURINE YELLOW 06/09/2021 1223   APPEARANCEUR CLEAR 06/09/2021 1223   LABSPEC 1.025 06/09/2021  [place] 5.5 06/09/2021 [place] 06/09/2021 1223   [place] 06/09/2021 1223   [place] 06/09/2021 [place] 06/09/2021 1223   PROTEINUR NEGATIVE 06/09/2021 1223   UROBILINOGEN 0.2 08/23/2015 1555   NITRITE NEGATIVE 06/09/2021 1223   LEUKOCYTESUR NEGATIVE 06/09/2021 1223   Sepsis Labs Invalid input(s): PROCALCITONIN,  WBC,  LACTICIDVEN Microbiology Recent Results (from the past 240 hour(s))  Resp Panel by RT-PCR (Flu A&B, Covid) Nasopharyngeal Swab     Status: None   Collection Time: 10/27/21  6:28 PM   Specimen: Nasopharyngeal Swab; Nasopharyngeal(NP) swabs in vial transport medium  Result Value Ref Range Status   SARS Coronavirus 2 by RT PCR NEGATIVE NEGATIVE Final    Comment: (NOTE) SARS-CoV-2 target nucleic acids are NOT DETECTED.  The SARS-CoV-2 RNA is generally detectable in upper  respiratory specimens during the acute phase of infection. The lowest concentration of SARS-CoV-2 viral copies this assay can detect is 138 copies/mL. A negative result does not preclude SARS-Cov-2 infection and should not be used as the sole basis for treatment or other patient management decisions. A negative result may occur with  improper specimen collection/handling, submission of specimen other than nasopharyngeal swab, presence of viral mutation(s) within the areas targeted by this assay, and inadequate number of viral copies(<138 copies/mL). A negative result must be combined with clinical observations, patient history, and epidemiological information. The expected result is Negative.  Fact Sheet for Patients:  EntrepreneurPulse.com.au  Fact Sheet for Healthcare Providers:  IncredibleEmployment.be  This test is no t yet approved or cleared by the [place] FDA and  has been authorized for detection and/or diagnosis of SARS-CoV-2 by FDA under an Emergency Use Authorization (EUA). This EUA will remain  in effect (meaning this test can be used) for the duration of the COVID-19 declaration under Section 564(b)(1) of the Act, 21 U.S.C.section 360bbb-3(b)(1), unless the authorization is terminated  or revoked sooner.       Influenza A by PCR NEGATIVE NEGATIVE Final   Influenza B by PCR NEGATIVE NEGATIVE Final    Comment: (NOTE) The Xpert Xpress SARS-CoV-2/FLU/RSV plus assay is intended as an aid in the diagnosis of influenza from Nasopharyngeal swab specimens and should not be used as a sole basis for treatment. Nasal washings and aspirates are unacceptable for Xpert Xpress SARS-CoV-2/FLU/RSV testing.  Fact Sheet for Patients: EntrepreneurPulse.com.au  Fact Sheet for Healthcare Providers: IncredibleEmployment.be  This test is not yet approved or cleared by the [place] FDA and has been  authorized for detection and/or diagnosis of SARS-CoV-2 by FDA under an Emergency Use Authorization (EUA). This EUA will remain in effect (meaning this test can be used) for the duration of the COVID-19 declaration under Section 564(b)(1) of the Act, 21 U.S.C. section 360bbb-3(b)(1), unless the authorization is terminated or revoked.  Performed at [facility], [address] [place], [place] [address]   Resp panel by RT-PCR (RSV, Flu A&B, Covid)     Status: None   Collection Time: 10/27/21  6:28 PM  Result Value Ref Range Status   SARS Coronavirus 2 by RT PCR NEGATIVE NEGATIVE Final    Comment: (NOTE) SARS-CoV-2 target nucleic acids are NOT DETECTED.  The SARS-CoV-2 RNA is generally detectable in upper respiratory specimens during the acute phase of infection. The lowest concentration of SARS-CoV-2 viral copies this assay can detect is 138 copies/mL. A negative result does not preclude SARS-Cov-2 infection and should not be used as the  sole basis for treatment or other patient management decisions. A negative result may occur with  improper specimen collection/handling, submission of specimen other than nasopharyngeal swab, presence of viral mutation(s) within the areas targeted by this assay, and inadequate number of viral copies(<138 copies/mL). A negative result must be combined with clinical observations, patient history, and epidemiological information. The expected result is Negative.  Fact Sheet for Patients:  EntrepreneurPulse.com.au  Fact Sheet for Healthcare Providers:  IncredibleEmployment.be  This test is no t yet approved or cleared by the [place] FDA and  has been authorized for detection and/or diagnosis of SARS-CoV-2 by FDA under an Emergency Use Authorization (EUA). This EUA will remain  in effect (meaning this test can be used) for the duration of the COVID-19 declaration under Section 564(b)(1) of the  Act, 21 U.S.C.section 360bbb-3(b)(1), unless the authorization is terminated  or revoked sooner.       Influenza A by PCR NEGATIVE NEGATIVE Final   Influenza B by PCR NEGATIVE NEGATIVE Final    Comment: (NOTE) The Xpert Xpress SARS-CoV-2/FLU/RSV plus assay is intended as an aid in the diagnosis of influenza from Nasopharyngeal swab specimens and should not be used as a sole basis for treatment. Nasal washings and aspirates are unacceptable for Xpert Xpress SARS-CoV-2/FLU/RSV testing.  Fact Sheet for Patients: EntrepreneurPulse.com.au  Fact Sheet for Healthcare Providers: IncredibleEmployment.be  This test is not yet approved or cleared by the [place] FDA and has been authorized for detection and/or diagnosis of SARS-CoV-2 by FDA under an Emergency Use Authorization (EUA). This EUA will remain in effect (meaning this test can be used) for the duration of the COVID-19 declaration under Section 564(b)(1) of the Act, 21 U.S.C. section 360bbb-3(b)(1), unless the authorization is terminated or revoked.     Resp Syncytial Virus by PCR NEGATIVE NEGATIVE Final    Comment: (NOTE) Fact Sheet for Patients: EntrepreneurPulse.com.au  Fact Sheet for Healthcare Providers: IncredibleEmployment.be  This test is not yet approved or cleared by the [place] FDA and has been authorized for detection and/or diagnosis of SARS-CoV-2 by FDA under an Emergency Use Authorization (EUA). This EUA will remain in effect (meaning this test can be used) for the duration of the COVID-19 declaration under Section 564(b)(1) of the Act, 21 U.S.C. section 360bbb-3(b)(1), unless the authorization is terminated or revoked.  Performed at [facility], [place] [address], [place], [place] [address]      Time coordinating discharge: 35 minutes  SIGNED:   Cordelia Poche, MD [facility] 10/30/2021, 11:15  AM

## 2021-10-30 NOTE — Discharge Instructions (Addendum)
Kevin Villegas,  You were in the hospital with a blockage affecting your kidney. You had a stent placed with improvement of your symptoms. Your kidney function is improving. Please follow-up with urology as an outpatient. While you were here, your blood pressure was very high. We talked about this and decided that you will follow-up with your primary doctor to discuss treatment options rather than initiating treatment on discharge. You will need repeat labwork this upcoming week; this can be arranged by your primary physician.

## 2021-10-30 NOTE — Progress Notes (Signed)
The patient is alert and oriented and has been seen by his physician. The orders for discharge were written. IV has been removed. Went over discharge instructions with patient. He is being discharged (walking down to his ride) by a staff member with all of his belongings.

## 2021-10-30 NOTE — Plan of Care (Signed)

## 2021-11-01 ENCOUNTER — Telehealth: Payer: Self-pay | Admitting: Family Medicine

## 2021-11-01 NOTE — Telephone Encounter (Signed)
Left message for pt to return call.

## 2021-11-01 NOTE — Telephone Encounter (Signed)
Transition Care Management Follow-up Telephone Call Date of discharge and from where: Kevin Villegas 10/30/21 Diagnosis: AKI How have you been since you were released from the hospital? Much better Any questions or concerns? Yes - just wants to make sure he is doing everything right to protect his kidneys. - has questions for Kevin Villegas  Items Reviewed: Did the pt receive and understand the discharge instructions provided? Yes  Medications obtained and verified? Yes  Other? No  Any new allergies since your discharge? No  Dietary orders reviewed? Yes Do you have support at home? Yes   Home Care and Equipment/Supplies: Were home health services ordered? no If so, what is the name of the agency? N/a  Has the agency set up a time to come to the patient's home? not applicable Were any new equipment or medical supplies ordered?  No What is the name of the medical supply agency? N/a Were you able to get the supplies/equipment? not applicable Do you have any questions related to the use of the equipment or supplies? No  Functional Questionnaire: (I = Independent and D = Dependent) ADLs: I  Bathing/Dressing- I  Meal Prep- I  Eating- I  Maintaining continence- I  Transferring/Ambulation- I  Managing Meds- I  Follow up appointments reviewed:  PCP Hospital f/u appt confirmed? Yes  Scheduled to see Villegas on 11/04/21 @ 1:40. [place] [facility] f/u appt confirmed? Yes  Scheduled to see Urology on 11/05/21 Are transportation arrangements needed? No  - he requests phone visit as he is already exposed so much with all of his specialist appts If their condition worsens, is the pt aware to call PCP or go to the Emergency Dept.? Yes Was the patient provided with contact information for the PCP's office or ED? Yes Was to pt encouraged to call back with questions or concerns? Yes

## 2021-11-04 ENCOUNTER — Ambulatory Visit (INDEPENDENT_AMBULATORY_CARE_PROVIDER_SITE_OTHER): Payer: Medicare Other | Admitting: Family Medicine

## 2021-11-04 ENCOUNTER — Encounter: Payer: Self-pay | Admitting: Family Medicine

## 2021-11-04 DIAGNOSIS — N1330 Unspecified hydronephrosis: Secondary | ICD-10-CM | POA: Diagnosis not present

## 2021-11-04 DIAGNOSIS — I10 Essential (primary) hypertension: Secondary | ICD-10-CM | POA: Diagnosis not present

## 2021-11-04 DIAGNOSIS — N201 Calculus of ureter: Secondary | ICD-10-CM | POA: Diagnosis not present

## 2021-11-04 DIAGNOSIS — N179 Acute kidney failure, unspecified: Secondary | ICD-10-CM | POA: Diagnosis not present

## 2021-11-04 NOTE — Progress Notes (Signed)
Virtual Visit via telephone Note  I connected with Kevin Villegas on 11/04/21 at 1350 by telephone and verified that I am speaking with the correct person using two identifiers. Kevin Villegas is currently located at home and patient are currently with her during visit. The provider, [name] Tryton Bodi, MD is located in their office at time of visit.  Call ended at 1402  I discussed the limitations, risks, security and privacy concerns of performing an evaluation and management service by telephone and the availability of in person appointments. I also discussed with the patient that there may be a patient responsible charge related to this service. The patient expressed understanding and agreed to proceed.   History and Present Illness: Patient is calling in for hospital f/u and went to ER on 10/27/21 and discharged on 10/30/21.  His urethra were collapsed and placed a stent for his left kidney.  He had a stone on the right side.  He did have elevated kidney function.  He is going to do surgery to try and blast kidney stone and removed stent.  If they can remove stone then they may remove stent. He is urinating better and feels better. They are concerned whether the right kidney will not return to function.   The blood pressure has 117/83, 126/93 and stays 150/90-100.  And has been elevated. He is worried about it being his kidneys.   1. AKI (acute kidney injury) ([place])   2. Right ureteral stone   3. Hydronephrosis of left kidney   4. Essential hypertension     Outpatient Encounter Medications as of 11/04/2021  Medication Sig   polyethylene glycol (MIRALAX / GLYCOLAX) 17 g packet Take 17 g by mouth daily.   No facility-administered encounter medications on file as of 11/04/2021.    Review of Systems  Constitutional:  Negative for chills and fever.  Eyes:  Negative for visual disturbance.  Respiratory:  Negative for shortness of breath and wheezing.   Cardiovascular:  Negative for  chest pain and leg swelling.  Musculoskeletal:  Negative for back pain and gait problem.  Skin:  Negative for rash.  All other systems reviewed and are negative.  Observations/Objective: Patient sounds comfortable and in no acute distress  Assessment and Plan: Problem List Items Addressed This Visit       Cardiovascular and Mediastinum   Essential hypertension     Genitourinary   Right ureteral stone   Hydronephrosis of left kidney   Other Visit Diagnoses     AKI (acute kidney injury) ([place])    -  Primary      F/u after procedure tomorrow, feeling better  Follow up plan: Return if symptoms worsen or fail to improve.     I discussed the assessment and treatment plan with the patient. The patient was provided an opportunity to ask questions and all were answered. The patient agreed with the plan and demonstrated an understanding of the instructions.   The patient was advised to call back or seek an in-person evaluation if the symptoms worsen or if the condition fails to improve as anticipated.  The above assessment and management plan was discussed with the patient. The patient verbalized understanding of and has agreed to the management plan. Patient is aware to call the clinic if symptoms persist or worsen. Patient is aware when to return to the clinic for a follow-up visit. Patient educated on when it is appropriate to go to the emergency department.    I provided 12  minutes of non-face-to-face time during this encounter.    Worthy Rancher, MD

## 2021-11-05 ENCOUNTER — Other Ambulatory Visit: Payer: Self-pay | Admitting: Urology

## 2021-11-09 ENCOUNTER — Inpatient Hospital Stay (HOSPITAL_BASED_OUTPATIENT_CLINIC_OR_DEPARTMENT_OTHER)
Admission: EM | Admit: 2021-11-09 | Discharge: 2021-11-13 | DRG: 690 | Disposition: A | Discharge status: Home / Self Care | Payer: Medicare Other | Attending: Family Medicine | Admitting: Family Medicine

## 2021-11-09 ENCOUNTER — Other Ambulatory Visit: Payer: Self-pay

## 2021-11-09 ENCOUNTER — Encounter (HOSPITAL_BASED_OUTPATIENT_CLINIC_OR_DEPARTMENT_OTHER): Payer: Self-pay | Admitting: *Deleted

## 2021-11-09 ENCOUNTER — Emergency Department (HOSPITAL_BASED_OUTPATIENT_CLINIC_OR_DEPARTMENT_OTHER): Payer: Medicare Other

## 2021-11-09 ENCOUNTER — Other Ambulatory Visit: Payer: Medicare Other

## 2021-11-09 DIAGNOSIS — Z20822 Contact with and (suspected) exposure to covid-19: Secondary | ICD-10-CM | POA: Diagnosis present

## 2021-11-09 DIAGNOSIS — I129 Hypertensive chronic kidney disease with stage 1 through stage 4 chronic kidney disease, or unspecified chronic kidney disease: Secondary | ICD-10-CM | POA: Diagnosis present

## 2021-11-09 DIAGNOSIS — N136 Pyonephrosis: Secondary | ICD-10-CM | POA: Diagnosis not present

## 2021-11-09 DIAGNOSIS — Z8507 Personal history of malignant neoplasm of pancreas: Secondary | ICD-10-CM

## 2021-11-09 DIAGNOSIS — N1831 Chronic kidney disease, stage 3a: Secondary | ICD-10-CM | POA: Diagnosis present

## 2021-11-09 DIAGNOSIS — I10 Essential (primary) hypertension: Secondary | ICD-10-CM | POA: Diagnosis present

## 2021-11-09 DIAGNOSIS — Z87442 Personal history of urinary calculi: Secondary | ICD-10-CM

## 2021-11-09 DIAGNOSIS — C252 Malignant neoplasm of tail of pancreas: Secondary | ICD-10-CM | POA: Diagnosis present

## 2021-11-09 DIAGNOSIS — N202 Calculus of kidney with calculus of ureter: Secondary | ICD-10-CM | POA: Diagnosis present

## 2021-11-09 DIAGNOSIS — N390 Urinary tract infection, site not specified: Secondary | ICD-10-CM | POA: Diagnosis present

## 2021-11-09 DIAGNOSIS — N12 Tubulo-interstitial nephritis, not specified as acute or chronic: Secondary | ICD-10-CM

## 2021-11-09 DIAGNOSIS — R34 Anuria and oliguria: Secondary | ICD-10-CM | POA: Diagnosis present

## 2021-11-09 DIAGNOSIS — N10 Acute tubulo-interstitial nephritis: Secondary | ICD-10-CM

## 2021-11-09 DIAGNOSIS — Z933 Colostomy status: Secondary | ICD-10-CM

## 2021-11-09 DIAGNOSIS — N1330 Unspecified hydronephrosis: Secondary | ICD-10-CM | POA: Diagnosis present

## 2021-11-09 DIAGNOSIS — N201 Calculus of ureter: Secondary | ICD-10-CM | POA: Diagnosis present

## 2021-11-09 DIAGNOSIS — E875 Hyperkalemia: Secondary | ICD-10-CM | POA: Diagnosis present

## 2021-11-09 DIAGNOSIS — K5900 Constipation, unspecified: Secondary | ICD-10-CM

## 2021-11-09 DIAGNOSIS — Z823 Family history of stroke: Secondary | ICD-10-CM

## 2021-11-09 DIAGNOSIS — N179 Acute kidney failure, unspecified: Secondary | ICD-10-CM | POA: Diagnosis present

## 2021-11-09 DIAGNOSIS — Z9081 Acquired absence of spleen: Secondary | ICD-10-CM

## 2021-11-09 DIAGNOSIS — N184 Chronic kidney disease, stage 4 (severe): Secondary | ICD-10-CM | POA: Diagnosis present

## 2021-11-09 DIAGNOSIS — Z90411 Acquired partial absence of pancreas: Secondary | ICD-10-CM

## 2021-11-09 LAB — CBC WITH DIFFERENTIAL/PLATELET
Abs Immature Granulocytes: 0.08 10*3/uL — ABNORMAL HIGH (ref 0.00–0.07)
Basophils Absolute: 0.1 10*3/uL (ref 0.0–0.1)
Basophils Relative: 0 %
Eosinophils Absolute: 0.3 10*3/uL (ref 0.0–0.5)
Eosinophils Relative: 2 %
HCT: 39.8 % (ref 39.0–52.0)
Hemoglobin: 12.9 g/dL — ABNORMAL LOW (ref 13.0–17.0)
Immature Granulocytes: 1 %
Lymphocytes Relative: 12 %
Lymphs Abs: 2 10*3/uL (ref 0.7–4.0)
MCH: 30 pg (ref 26.0–34.0)
MCHC: 32.4 g/dL (ref 30.0–36.0)
MCV: 92.6 fL (ref 80.0–100.0)
Monocytes Absolute: 1.9 10*3/uL — ABNORMAL HIGH (ref 0.1–1.0)
Monocytes Relative: 11 %
Neutro Abs: 12.8 10*3/uL — ABNORMAL HIGH (ref 1.7–7.7)
Neutrophils Relative %: 74 %
Platelets: 439 10*3/uL — ABNORMAL HIGH (ref 150–400)
RBC: 4.3 MIL/uL (ref 4.22–5.81)
RDW: 13.8 % (ref 11.5–15.5)
WBC: 17.1 10*3/uL — ABNORMAL HIGH (ref 4.0–10.5)
nRBC: 0 % (ref 0.0–0.2)

## 2021-11-09 LAB — BASIC METABOLIC PANEL
Anion gap: 9 (ref 5–15)
BUN: 34 mg/dL — ABNORMAL HIGH (ref 8–23)
CO2: 23 mmol/L (ref 22–32)
Calcium: 8.6 mg/dL — ABNORMAL LOW (ref 8.9–10.3)
Chloride: 106 mmol/L (ref 98–111)
Creatinine, Ser: 2.39 mg/dL — ABNORMAL HIGH (ref 0.61–1.24)
GFR, Estimated: 27 mL/min — ABNORMAL LOW (ref >60)
Glucose, Bld: 93 mg/dL (ref 70–99)
Potassium: 4.9 mmol/L (ref 3.5–5.1)
Sodium: 138 mmol/L (ref 135–145)

## 2021-11-09 MED ORDER — KETOROLAC TROMETHAMINE 30 MG/ML IJ SOLN: 15.0000 mg | Freq: Once | INTRAMUSCULAR | Status: DC

## 2021-11-09 MED ORDER — OXYCODONE-ACETAMINOPHEN 5-325 MG PO TABS
1.0000 | ORAL_TABLET | Freq: Once | ORAL | Status: AC
  Administered 2021-11-09: 1 via ORAL
  Filled 2021-11-09: qty 1

## 2021-11-09 NOTE — ED Provider Notes (Signed)
[facility] EMERGENCY DEPARTMENT Provider Note   CSN: [id number] Arrival date & time: 11/09/21  2134     History Chief Complaint  Patient presents with   Urinary Retention    Kevin Villegas is a 80 y.o. male.  This is a patient who has a past medical history of pancreatic cancer with metastatic disease that was complicated by distal bowel obstruction status post diverting colectomy, CKD stage III, recent left-sided ureteral stent placement  Patient presents today with left-sided flank pain that started around 4 PM today.  He says its been constant and feels just like it did 2 weeks ago when he got admitted for urinary retention.  He states that he has not been able to urinate since about 4 PM today as well.  He does not feel like he needs to go, but he is concerned that his stent has came out of place or is blocked up again.  He denies any chest pain, shortness of breath, abdominal pain, nausea, vomiting.  HPI     Past Medical History:  Diagnosis Date   Colostomy present [facility])    Large bowel obstruction ([place])    Pancreatic cancer [facility])     Patient Active Problem List   Diagnosis Date Noted   Essential hypertension 10/30/2021   Hydronephrosis of left kidney 10/28/2021   Hyperkalemia 10/28/2021   Acute kidney injury superimposed on chronic kidney disease ([place]) 10/27/2021   Ureteral stricture, left 10/27/2021   Protein-calorie malnutrition, severe 06/12/2021   Hydroureter on right 06/10/2021   Aortic atherosclerosis ([place]) 06/10/2021   Renal insufficiency 06/09/2021   Acute prerenal azotemia 06/02/2021   Leukocytosis 06/02/2021   Right ureteral stone 06/02/2021   Large bowel obstruction ([facility]) secondary to colonic stricture 06/01/2021   Colon stricture ([place])    Pancreatic mass 06/27/2019   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  with metastatic disease 06/27/2019   Rhinitis medicamentosa 02/26/2019    Past Surgical History:  Procedure Laterality  Date   ABDOMINAL SURGERY     BIOPSY  05/20/2021   Procedure: BIOPSY;  Surgeon: Otis Brace, MD;  Location: [facility];  Service: Gastroenterology;;   BOWEL RESECTION     BOWEL RESECTION     CHOLECYSTECTOMY N/A 12/16/2014   Procedure: LAPAROSCOPIC CHOLECYSTECTOMY WITH INTRAOPERATIVE CHOLANGIOGRAM;  Surgeon: Georganna Skeans, MD;  Location: [facility];  Service: General;  Laterality: N/A;   COLONIC STENT PLACEMENT N/A 05/26/2021   Procedure: COLONIC STENT PLACEMENT;  Surgeon: Clarene Essex, MD;  Location: [facility];  Service: Endoscopy;  Laterality: N/A;   CYSTOSCOPY W/ URETERAL STENT PLACEMENT Left 10/27/2021   Procedure: CYSTOSCOPY WITH RETROGRADE PYELOGRAM/URETERAL STENT PLACEMENT;  Surgeon: Raynelle Bring, MD;  Location: [facility];  Service: Urology;  Laterality: Left;   ESOPHAGOGASTRODUODENOSCOPY N/A 05/20/2021   Procedure: ESOPHAGOGASTRODUODENOSCOPY (EGD);  Surgeon: Otis Brace, MD;  Location: [facility];  Service: Gastroenterology;  Laterality: N/A;   EUS N/A 09/24/2015   Procedure: UPPER ENDOSCOPIC ULTRASOUND (EUS) LINEAR;  Surgeon: Milus Banister, MD;  Location: [facility];  Service: Endoscopy;  Laterality: N/A;   FLEXIBLE SIGMOIDOSCOPY N/A 05/20/2021   Procedure: FLEXIBLE SIGMOIDOSCOPY;  Surgeon: Otis Brace, MD;  Location: [facility];  Service: Gastroenterology;  Laterality: N/A;   FLEXIBLE SIGMOIDOSCOPY N/A 05/26/2021   Procedure: FLEXIBLE SIGMOIDOSCOPY;  Surgeon: Clarene Essex, MD;  Location: [facility];  Service: Endoscopy;  Laterality: N/A;   LAPAROSCOPIC DISTAL PANCREATECTOMY  10/2017   [facility]   LAPAROSCOPIC PARTIAL GASTRECTOMY  10/2017   [facility]   LAPAROSCOPIC SIGMOID COLECTOMY  N/A 06/14/2021   Procedure: LAPAROSCOPY WITH MOBILIZATION, PROXIMAL DIVERTING COLOSTOMY, CREATION OF DISTAL MUCOUS FISTULA;  Surgeon: Johnathan Hausen, MD;  Location: [facility];  Service: General;  Laterality: N/A;   LAPAROSCOPIC SPLENECTOMY  10/2017   [facility]   VASECTOMY         Family  History  Problem Relation Age of Onset   Stroke Mother     Social History   Tobacco Use   Smoking status: Never   Smokeless tobacco: Never  Vaping Use   Vaping Use: Never used  Substance Use Topics   Alcohol use: No    Alcohol/week: 0.0 standard drinks   Drug use: No    Home Medications Prior to Admission medications   Medication Sig Start Date End Date Taking? Authorizing Provider  polyethylene glycol (MIRALAX / GLYCOLAX) 17 g packet Take 17 g by mouth daily. 10/31/21   Mariel Aloe, MD    Allergies    Pollen extract  Review of Systems   Review of Systems  Constitutional:  Negative for chills and fever.  HENT:  Negative for ear pain and sore throat.   Eyes:  Negative for pain and visual disturbance.  Respiratory:  Negative for cough and shortness of breath.   Cardiovascular:  Negative for chest pain and palpitations.  Gastrointestinal:  Negative for abdominal pain and vomiting.  Genitourinary:  Positive for decreased urine volume and flank pain. Negative for dysuria and hematuria.  Musculoskeletal:  Negative for arthralgias and back pain.  Skin:  Negative for color change and rash.  Neurological:  Negative for seizures and syncope.  All other systems reviewed and are negative.  Physical Exam Updated Vital Signs BP (!) 188/99   Pulse 69   Temp 98 F (36.7 C) (Oral)   Resp 17   Ht 5\' 8"  (1.727 m)   Wt 63.5 kg   SpO2 96%   BMI 21.29 kg/m   Physical Exam Vitals and nursing note reviewed.  Constitutional:      General: He is not in acute distress.    Appearance: Normal appearance. He is well-developed. He is not ill-appearing, toxic-appearing or diaphoretic.  HENT:     Head: Normocephalic and atraumatic.     Nose: No nasal deformity.     Mouth/Throat:     Lips: Pink. No lesions.  Eyes:     General: Gaze aligned appropriately. No scleral icterus.       Right eye: No discharge.        Left eye: No discharge.     Conjunctiva/sclera: Conjunctivae normal.      Right eye: Right conjunctiva is not injected. No exudate or hemorrhage.    Left eye: Left conjunctiva is not injected. No exudate or hemorrhage. Cardiovascular:     Rate and Rhythm: Normal rate and regular rhythm.     Pulses: Normal pulses.     Heart sounds: Normal heart sounds. No murmur heard.   No friction rub. No gallop.  Pulmonary:     Effort: Pulmonary effort is normal. No respiratory distress.     Breath sounds: Normal breath sounds. No stridor. No wheezing, rhonchi or rales.  Abdominal:     General: Abdomen is flat. There is no distension.     Palpations: Abdomen is soft.     Tenderness: There is no abdominal tenderness. There is left CVA tenderness. There is no right CVA tenderness, guarding or rebound.  Musculoskeletal:     Right lower leg: No edema.     Left lower  leg: No edema.  Skin:    General: Skin is warm and dry.     Coloration: Skin is not jaundiced or pale.     Findings: No bruising, erythema, lesion or rash.  Neurological:     Mental Status: He is alert and oriented to person, place, and time.  Psychiatric:        Mood and Affect: Mood normal.        Speech: Speech normal.        Behavior: Behavior normal. Behavior is cooperative.    ED Results / Procedures / Treatments   Labs (all labs ordered are listed, but only abnormal results are displayed) Labs Reviewed  BASIC METABOLIC PANEL - Abnormal; Notable for the following components:      Result Value   BUN 34 (*)    Creatinine, Ser 2.39 (*)    Calcium 8.6 (*)    GFR, Estimated 27 (*)    All other components within normal limits  CBC WITH DIFFERENTIAL/PLATELET - Abnormal; Notable for the following components:   WBC 17.1 (*)    Hemoglobin 12.9 (*)    Platelets 439 (*)    Neutro Abs 12.8 (*)    Monocytes Absolute 1.9 (*)    Abs Immature Granulocytes 0.08 (*)    All other components within normal limits  URINALYSIS, ROUTINE W REFLEX MICROSCOPIC - Abnormal; Notable for the following components:    APPearance HAZY (*)    Hgb urine dipstick LARGE (*)    Protein, ur 100 (*)    Leukocytes,Ua TRACE (*)    All other components within normal limits  URINALYSIS, MICROSCOPIC (REFLEX) - Abnormal; Notable for the following components:   Bacteria, UA RARE (*)    All other components within normal limits  URINE CULTURE  RESP PANEL BY RT-PCR (FLU A&B, COVID) ARPGX2  CULTURE, BLOOD (ROUTINE X 2)  CULTURE, BLOOD (ROUTINE X 2)  LACTIC ACID, PLASMA  LACTIC ACID, PLASMA    EKG None  Radiology CT Renal Stone Study  Result Date: 11/09/2021 CLINICAL DATA:  Flank pain, kidney stone suspected recent stent in left kidney. need to evaluate placement EXAM: CT ABDOMEN AND PELVIS WITHOUT CONTRAST TECHNIQUE: Multidetector CT imaging of the abdomen and pelvis was performed following the standard protocol without IV contrast. COMPARISON:  CT 10/26/2021.  Renal ultrasound 10/27/2021 FINDINGS: Lower chest: Multiple bilateral basilar pulmonary nodules. Cavitary nodule in the left lower lobe. Many of these nodules were not previously included in the field of view. There is increased size of a right lower lobe nodule currently measuring 12 by 6 mm, series 3, image 4, previously 11 x 5 mm. Small left pleural effusion is similar. Hepatobiliary: Stable fluid density lesions in the liver consistent with cysts. Some of the smaller low-density lesions are too small to characterize. No new liver lesion. Cholecystectomy. No biliary dilatation. Pancreas: Soft tissue density in the region of the pancreatic body adjacent to duodenal stent measures approximately 3 x 3.8 cm using same measurement technique, previously 4.1 x 3.5 cm. The remainder the pancreas is difficult to delineate. Spleen: Surgically absent. Adrenals/Urinary Tract: No adrenal nodule. Left nephroureteral stent in place with proximal pigtail in the renal pelvis and distal pigtail in the bladder. Despite appropriate stent placement there is persistent moderate left  hydroureteronephrosis. Left perinephric edema has slightly increased. Small focus of air in the left renal collecting system is likely related to stent placement. No stone or stone fragments along the course of the stent or in the bladder.  Urinary bladder is partially distended. Chronic moderate right hydronephrosis with renal parenchymal atrophy. Unchanged right renal cyst. Stable 6 mm stone in the distal right ureter, series 2, image 60. Stomach/Bowel: Bowel assessment is limited in the absence of enteric contrast and paucity of intra-abdominal fat. Postsurgical change of the stomach. Duodenal stent is unchanged in position. Proximal small bowel is nondilated. Mid small bowel is prominent with fecalization of small bowel contents. More distal small bowel is decompressed. There is no mesenteric inflammation or swirling of mesenteric vasculature. Multifocal colonic diverticulosis. No acute diverticulitis. Moderate colonic stool burden. Descending colostomy without peristomal hernia. Stapled off sigmoid colon in the pelvis. Vascular/Lymphatic: Mild aortic atherosclerosis. No aortic aneurysm. Limited assessment for adenopathy. No definite bulky enlarged abdominopelvic lymph nodes. Reproductive: Prominent prostate.  Left inguinal testis. Other: Trace free fluid in the pelvis. No free air. Postsurgical change of the abdominal wall. Tiny fat containing umbilical hernia. Musculoskeletal: Degenerative change throughout the spine and both hips. No acute osseous findings. IMPRESSION: 1. Left nephroureteral stent in place with persistent moderate left hydroureteronephrosis. Left perinephric edema has slightly increased. Small focus of air in the left renal collecting system is likely related to stent placement. No stone or stone fragments along the course of the stent or in the bladder. 2. Chronic moderate right hydronephrosis with renal parenchymal atrophy. Stable 6 mm stone in the distal right ureter. 3. Soft tissue density  in the region of the pancreatic body adjacent to duodenal stent is unchanged in size from prior CT. 4. Multiple bilateral basilar pulmonary nodules consistent with pulmonary metastasis. Small left pleural effusion is similar. 5. Colonic diverticulosis without diverticulitis. 6. Fecalization of small bowel contents with slight increased diameter of distal small bowel suggests slow transit. No bowel obstruction. Aortic Atherosclerosis (ICD10-I70.0). Electronically Signed   By: Keith Rake M.D.   On: 11/09/2021 22:33    Procedures Procedures   Medications Ordered in ED Medications  cefTRIAXone (ROCEPHIN) 1 g in sodium chloride 0.9 % 100 mL IVPB (1 g Intravenous New Bag/Given 11/10/21 0056)  oxyCODONE-acetaminophen (PERCOCET/ROXICET) 5-325 MG per tablet 1 tablet (1 tablet Oral Given 11/09/21 2222)  cephALEXin (KEFLEX) capsule 500 mg (500 mg Oral Given 11/10/21 0019)    ED Course  I have reviewed the triage vital signs and the nursing notes.  Pertinent labs & imaging results that were available during my care of the patient were reviewed by me and considered in my medical decision making (see chart for details).  Clinical Course as of 11/10/21 0106  Tue Nov 09, 2021  2248 WBC(!): 17.1 [GL]  2251 Left nephroureteral stent in place with persistent moderate left hydroureteronephrosis. Left perinephric edema has slightly increased. Small focus of air in the left renal collecting system is likely related to stent placement. No stone or stone fragments along the course of the stent or in the bladder. 2. Chronic moderate right hydronephrosis with renal parenchymal atrophy. Stable 6 mm stone in the distal right ureter. 3. Soft tissue density in the region of the pancreatic body adjacent to duodenal stent is unchanged in size from prior CT.   [GL]    Clinical Course User Index [GL] Colburn Asper, Adora Fridge, PA-C   MDM Rules/Calculators/A&P                          This is a 80 year old male who  presents with about 6 hours of urinary retention and left flank pain.    He was recently seen  in the emergency department about 3 weeks ago.  He was found to have an obstructive kidney stone.  He was also found to have a new AKI with a creatinine of 12 as well as hydronephrosis of bilateral kidneys that was believed to have contributed to the AKI.  He was also found to have a left ureteral obstruction due to a stone.  He was taken for cystoscopy and had a left ureteral stent placed.  Also had severe right hydroureteronephrosis.  They were unable to get a stent in the left ureter.  He was admitted until 12 November and discharged home.  On arrival to the emergency department today, he is afebrile.  Hemodynamically stable.  Oxygenating well on room air.    I have a concern for repeated ureter obstruction or dislodgment of stent. Will obtain CT imaging.   All labs and imaging reviewed by me.  They are notable for leukocytosis of 17.1.  Stable chronic anemia.  He has a stable creatinine of 2.39.  T stone study is notable for the left nephroureteral stent that is in place with associated moderate left hydroureteronephrosis with associated perinephritic edema that is increased from previous.  He also has a stable 6 mm stone in the right ureter with chronic right hydronephrosis.  I spoke with Dr. Tresa Moore from urology.  He is not concerned for any obstruction of the stent or further stone formation and does not need any urological intervention right now.   I spoke with patient who has continued concern for going home given that he is no longer making any urine and is having severe left flank pain. He does not feel safe being discharged at this time. I think he could possibly have pyelonephritis given his elevated white count and current symptoms.  He is pretty high risk given his age and only having one functioning kidney which is the one affected. Starting IV antibiotics and plan for admission.  Spoke with Dr.  Cyd Silence from  hospitalist service. at 1228. He will accept patient to his service. Recommends getting in and out urine, blood cultures, and lactate.   Final Clinical Impression(s) / ED Diagnoses Final diagnoses:  Pyelonephritis    Rx / DC Orders ED Discharge Orders     None        Adolphus Birchwood, PA-C 11/10/21 0106    Tegeler, Gwenyth Allegra, MD 11/15/21 1530

## 2021-11-09 NOTE — ED Triage Notes (Addendum)
C/o urinary retention x 6 hrs , last urination @ 4pm also c/o left back pain , recent kidney stent surgery

## 2021-11-10 ENCOUNTER — Encounter (HOSPITAL_COMMUNITY): Payer: Self-pay | Admitting: Internal Medicine

## 2021-11-10 DIAGNOSIS — N10 Acute tubulo-interstitial nephritis: Secondary | ICD-10-CM

## 2021-11-10 DIAGNOSIS — Z933 Colostomy status: Secondary | ICD-10-CM

## 2021-11-10 DIAGNOSIS — N390 Urinary tract infection, site not specified: Secondary | ICD-10-CM | POA: Diagnosis present

## 2021-11-10 DIAGNOSIS — Z20822 Contact with and (suspected) exposure to covid-19: Secondary | ICD-10-CM | POA: Diagnosis not present

## 2021-11-10 DIAGNOSIS — Z9081 Acquired absence of spleen: Secondary | ICD-10-CM | POA: Diagnosis not present

## 2021-11-10 DIAGNOSIS — I129 Hypertensive chronic kidney disease with stage 1 through stage 4 chronic kidney disease, or unspecified chronic kidney disease: Secondary | ICD-10-CM | POA: Diagnosis not present

## 2021-11-10 DIAGNOSIS — N1831 Chronic kidney disease, stage 3a: Secondary | ICD-10-CM | POA: Diagnosis present

## 2021-11-10 DIAGNOSIS — R34 Anuria and oliguria: Secondary | ICD-10-CM | POA: Diagnosis not present

## 2021-11-10 DIAGNOSIS — N136 Pyonephrosis: Secondary | ICD-10-CM | POA: Diagnosis not present

## 2021-11-10 DIAGNOSIS — E875 Hyperkalemia: Secondary | ICD-10-CM | POA: Diagnosis not present

## 2021-11-10 DIAGNOSIS — N12 Tubulo-interstitial nephritis, not specified as acute or chronic: Secondary | ICD-10-CM

## 2021-11-10 DIAGNOSIS — Z823 Family history of stroke: Secondary | ICD-10-CM | POA: Diagnosis not present

## 2021-11-10 DIAGNOSIS — N184 Chronic kidney disease, stage 4 (severe): Secondary | ICD-10-CM

## 2021-11-10 DIAGNOSIS — Z87442 Personal history of urinary calculi: Secondary | ICD-10-CM | POA: Diagnosis not present

## 2021-11-10 DIAGNOSIS — Z8507 Personal history of malignant neoplasm of pancreas: Secondary | ICD-10-CM | POA: Diagnosis not present

## 2021-11-10 DIAGNOSIS — I10 Essential (primary) hypertension: Secondary | ICD-10-CM

## 2021-11-10 DIAGNOSIS — N202 Calculus of kidney with calculus of ureter: Secondary | ICD-10-CM | POA: Diagnosis not present

## 2021-11-10 DIAGNOSIS — N179 Acute kidney failure, unspecified: Secondary | ICD-10-CM | POA: Diagnosis not present

## 2021-11-10 DIAGNOSIS — Z90411 Acquired partial absence of pancreas: Secondary | ICD-10-CM | POA: Diagnosis not present

## 2021-11-10 LAB — BASIC METABOLIC PANEL
Anion gap: 9 (ref 5–15)
BUN: 40 mg/dL — ABNORMAL HIGH (ref 8–23)
CO2: 22 mmol/L (ref 22–32)
Calcium: 8.6 mg/dL — ABNORMAL LOW (ref 8.9–10.3)
Chloride: 105 mmol/L (ref 98–111)
Creatinine, Ser: 3.17 mg/dL — ABNORMAL HIGH (ref 0.61–1.24)
GFR, Estimated: 19 mL/min — ABNORMAL LOW (ref >60)
Glucose, Bld: 113 mg/dL — ABNORMAL HIGH (ref 70–99)
Potassium: 4.9 mmol/L (ref 3.5–5.1)
Sodium: 136 mmol/L (ref 135–145)

## 2021-11-10 LAB — CBC
HCT: 42.1 % (ref 39.0–52.0)
Hemoglobin: 13.4 g/dL (ref 13.0–17.0)
MCH: 29.9 pg (ref 26.0–34.0)
MCHC: 31.8 g/dL (ref 30.0–36.0)
MCV: 94 fL (ref 80.0–100.0)
Platelets: 514 10*3/uL — ABNORMAL HIGH (ref 150–400)
RBC: 4.48 MIL/uL (ref 4.22–5.81)
RDW: 13.8 % (ref 11.5–15.5)
WBC: 19.5 10*3/uL — ABNORMAL HIGH (ref 4.0–10.5)
nRBC: 0 % (ref 0.0–0.2)

## 2021-11-10 LAB — URINALYSIS, ROUTINE W REFLEX MICROSCOPIC
Bilirubin Urine: NEGATIVE
Glucose, UA: NEGATIVE mg/dL
Ketones, ur: NEGATIVE mg/dL
Nitrite: NEGATIVE
Protein, ur: 100 mg/dL — AB
Specific Gravity, Urine: 1.02 (ref 1.005–1.030)
pH: 5.5 (ref 5.0–8.0)

## 2021-11-10 LAB — RESP PANEL BY RT-PCR (FLU A&B, COVID) ARPGX2
Influenza A by PCR: NEGATIVE
Influenza B by PCR: NEGATIVE
SARS Coronavirus 2 by RT PCR: NEGATIVE

## 2021-11-10 LAB — LACTIC ACID, PLASMA
Lactic Acid, Venous: 0.8 mmol/L (ref 0.5–1.9)
Lactic Acid, Venous: 0.7 mmol/L (ref 0.5–1.9)

## 2021-11-10 LAB — URINALYSIS, MICROSCOPIC (REFLEX): RBC / HPF: >50 RBC/hpf (ref 0–5)

## 2021-11-10 MED ORDER — ONDANSETRON HCL 4 MG PO TABS: 4.0000 mg | ORAL_TABLET | Freq: Four times a day (QID) | ORAL | Status: DC | PRN

## 2021-11-10 MED ORDER — ONDANSETRON HCL 4 MG/2ML IJ SOLN
4.0000 mg | Freq: Four times a day (QID) | INTRAMUSCULAR | Status: DC | PRN
  Administered 2021-11-10: 4 mg via INTRAVENOUS
  Filled 2021-11-10: qty 2

## 2021-11-10 MED ORDER — CHLORHEXIDINE GLUCONATE CLOTH 2 % EX PADS
6.0000 | MEDICATED_PAD | Freq: Every day | CUTANEOUS | Status: DC
  Administered 2021-11-10 – 2021-11-12 (×3): 6 via TOPICAL

## 2021-11-10 MED ORDER — AMLODIPINE BESYLATE 5 MG PO TABS: 5.0000 mg | ORAL_TABLET | Freq: Every day | ORAL | Status: DC

## 2021-11-10 MED ORDER — CEPHALEXIN 250 MG PO CAPS: 250.0000 mg | ORAL_CAPSULE | Freq: Once | ORAL | Status: DC

## 2021-11-10 MED ORDER — SODIUM CHLORIDE 0.9 % IV SOLN: INTRAVENOUS | Status: DC

## 2021-11-10 MED ORDER — POLYETHYLENE GLYCOL 3350 17 G PO PACK
17.0000 g | PACK | Freq: Every day | ORAL | Status: DC | PRN
  Administered 2021-11-10: 17 g via ORAL
  Filled 2021-11-10: qty 1

## 2021-11-10 MED ORDER — OXYCODONE-ACETAMINOPHEN 5-325 MG PO TABS
1.0000 | ORAL_TABLET | Freq: Once | ORAL | Status: AC
  Administered 2021-11-10: 1 via ORAL
  Filled 2021-11-10: qty 1

## 2021-11-10 MED ORDER — ACETAMINOPHEN 325 MG PO TABS
650.0000 mg | ORAL_TABLET | Freq: Four times a day (QID) | ORAL | Status: DC | PRN
  Administered 2021-11-11 – 2021-11-12 (×3): 650 mg via ORAL
  Filled 2021-11-10 (×4): qty 2

## 2021-11-10 MED ORDER — TAMSULOSIN HCL 0.4 MG PO CAPS
0.4000 mg | ORAL_CAPSULE | Freq: Every day | ORAL | Status: DC
  Administered 2021-11-10 – 2021-11-12 (×3): 0.4 mg via ORAL
  Filled 2021-11-10 (×3): qty 1

## 2021-11-10 MED ORDER — ACETAMINOPHEN 650 MG RE SUPP: 650.0000 mg | Freq: Four times a day (QID) | RECTAL | Status: DC | PRN

## 2021-11-10 MED ORDER — CEPHALEXIN 250 MG PO CAPS
500.0000 mg | ORAL_CAPSULE | Freq: Once | ORAL | Status: AC
  Administered 2021-11-10: 500 mg via ORAL
  Filled 2021-11-10: qty 2

## 2021-11-10 MED ORDER — HEPARIN SODIUM (PORCINE) 5000 UNIT/ML IJ SOLN
5000.0000 [IU] | Freq: Three times a day (TID) | INTRAMUSCULAR | Status: DC
  Filled 2021-11-10: qty 1

## 2021-11-10 MED ORDER — HYDROMORPHONE HCL 1 MG/ML IJ SOLN: 1.0000 mg | INTRAMUSCULAR | Status: DC | PRN

## 2021-11-10 MED ORDER — SODIUM CHLORIDE 0.9 % IV SOLN
1.0000 g | INTRAVENOUS | Status: DC
  Administered 2021-11-11 – 2021-11-13 (×3): 1 g via INTRAVENOUS
  Filled 2021-11-10 (×3): qty 10

## 2021-11-10 MED ORDER — LACTATED RINGERS IV SOLN: INTRAVENOUS | Status: AC

## 2021-11-10 MED ORDER — OXYCODONE HCL 5 MG PO TABS
5.0000 mg | ORAL_TABLET | Freq: Four times a day (QID) | ORAL | Status: DC | PRN
  Administered 2021-11-10 (×2): 5 mg via ORAL
  Filled 2021-11-10 (×2): qty 1

## 2021-11-10 MED ORDER — AMLODIPINE BESYLATE 5 MG PO TABS
5.0000 mg | ORAL_TABLET | Freq: Every day | ORAL | Status: DC
  Administered 2021-11-10 – 2021-11-13 (×3): 5 mg via ORAL
  Filled 2021-11-10 (×3): qty 1

## 2021-11-10 MED ORDER — SODIUM CHLORIDE 0.9 % IV SOLN
1.0000 g | Freq: Once | INTRAVENOUS | Status: AC
  Administered 2021-11-10: 1 g via INTRAVENOUS
  Filled 2021-11-10: qty 10

## 2021-11-10 MED ORDER — OXYCODONE HCL 5 MG PO TABS
5.0000 mg | ORAL_TABLET | ORAL | Status: DC | PRN
  Administered 2021-11-11: 5 mg via ORAL
  Filled 2021-11-10: qty 1

## 2021-11-10 NOTE — Assessment & Plan Note (Signed)
Chronic issue. Stable. Associated right renal atrophy.

## 2021-11-10 NOTE — Assessment & Plan Note (Addendum)
In setting of recent ureteral obstruction and is s/p ureteral stent. Persistent hydronephrosis. Currently oliguric. Urology consulted and recommended foley catheter. Bilateral nephrostomy drains placed on 11/24. Creatinine is trending down after drain placement. Creatinine of 2.61 on discharge. Patient to follow-up with urology as an outpatient. Foley catheter removed prior to discharge.

## 2021-11-10 NOTE — Assessment & Plan Note (Addendum)
Persistent s/p ureteral stent from prior admission. Increased creatinine. Urology consulted. Nephrostomy drain placed. Outpatient urology follow-up

## 2021-11-10 NOTE — Assessment & Plan Note (Addendum)
Blood pressure uncontrolled. Started on amlodipine 5 mg daily on admission. Continue on discharge.

## 2021-11-10 NOTE — Consult Note (Addendum)
Consultation: Decreased urine output, left hydronephrosis Requested by: Dr. Cordelia Poche  History of Present Illness: Kevin Villegas is a 80 year old male who was seen by Dr. Louis Meckel in our office with a CT scan showing a 6 mm right distal ureteral stone proximal hydro and atrophy of the right kidney.  However he had new onset left hydronephrosis without a ureteral stone and Dr. Louis Meckel was planning an ASAP trip to the OR as patient's creatinine had gone up to 1.8.  However patient became anuric so Dr. Alinda Money took the patient 10/26/2021 to the operating room for cystoscopy and bilateral retrogrades.  Creatinine had climbed to 12.2.  He could not get right retrograde access, he was able to place a left stent.  He thought the left ureter had external compression in the mid ureter or a stricture.  Patient did need a colostomy earlier this year because of sigmoid colon stricture which has a retained stent.  His creatinine improved to 2.44 and he was discharged home.  Patient presented to emergency yesterday evening with what he felt like was the sudden inability to void and left flank pain.  He underwent CT scan of the abdomen and pelvis which showed mild left hydro consistent with a ureteral stent, stent was in good position, partially distended bladder, right distal stone and right renal atrophy.  His urinalysis was bland, showed 50 red cells, 21-50 white cells and rare bacteria.  Urine culture pending.  His white count was 17.  His creatinine was stable at 2.39 but was up some today to 3.17.  Patient reports he has not voided today but has not really felt the need to void. No fever.  A Foley catheter was placed today to max drain the system and he has made 200 cc of urine on rounds.  Urine clear.  Past Medical History:  Diagnosis Date   Colostomy present [facility])    Large bowel obstruction ([place])    Pancreatic cancer [facility])    Past Surgical History:  Procedure Laterality Date   ABDOMINAL SURGERY     BIOPSY   05/20/2021   Procedure: BIOPSY;  Surgeon: Otis Brace, MD;  Location: [facility];  Service: Gastroenterology;;   BOWEL RESECTION     BOWEL RESECTION     CHOLECYSTECTOMY N/A 12/16/2014   Procedure: LAPAROSCOPIC CHOLECYSTECTOMY WITH INTRAOPERATIVE CHOLANGIOGRAM;  Surgeon: Georganna Skeans, MD;  Location: [facility];  Service: General;  Laterality: N/A;   COLONIC STENT PLACEMENT N/A 05/26/2021   Procedure: COLONIC STENT PLACEMENT;  Surgeon: Clarene Essex, MD;  Location: [facility];  Service: Endoscopy;  Laterality: N/A;   CYSTOSCOPY W/ URETERAL STENT PLACEMENT Left 10/27/2021   Procedure: CYSTOSCOPY WITH RETROGRADE PYELOGRAM/URETERAL STENT PLACEMENT;  Surgeon: Raynelle Bring, MD;  Location: [facility];  Service: Urology;  Laterality: Left;   ESOPHAGOGASTRODUODENOSCOPY N/A 05/20/2021   Procedure: ESOPHAGOGASTRODUODENOSCOPY (EGD);  Surgeon: Otis Brace, MD;  Location: [facility];  Service: Gastroenterology;  Laterality: N/A;   EUS N/A 09/24/2015   Procedure: UPPER ENDOSCOPIC ULTRASOUND (EUS) LINEAR;  Surgeon: Milus Banister, MD;  Location: [facility];  Service: Endoscopy;  Laterality: N/A;   FLEXIBLE SIGMOIDOSCOPY N/A 05/20/2021   Procedure: FLEXIBLE SIGMOIDOSCOPY;  Surgeon: Otis Brace, MD;  Location[facility];  Service: Gastroenterology;  Laterality: N/A;   FLEXIBLE SIGMOIDOSCOPY N/A 05/26/2021   Procedure: FLEXIBLE SIGMOIDOSCOPY;  Surgeon: Clarene Essex, MD;  Location: [facility];  Service: Endoscopy;  Laterality: N/A;   LAPAROSCOPIC DISTAL PANCREATECTOMY  10/2017   [facility]   LAPAROSCOPIC PARTIAL GASTRECTOMY  10/2017  [facility] SIGMOID COLECTOMY N/A 06/14/2021   Procedure: LAPAROSCOPY WITH MOBILIZATION, PROXIMAL DIVERTING COLOSTOMY, CREATION OF DISTAL MUCOUS FISTULA;  Surgeon: Johnathan Hausen, MD;  Location: [facility];  Service: General;  Laterality: N/A;   LAPAROSCOPIC SPLENECTOMY  10/2017   [facility]   VASECTOMY      Home Medications:  Medications Prior to Admission   Medication Sig Dispense Refill Last Dose   oxymetazoline (AFRIN) 0.05 % nasal spray Place 1 spray into both nostrils 2 (two) times daily as needed for congestion.   Past Month   phenazopyridine (PYRIDIUM) 200 MG tablet Take 200 mg by mouth every 8 (eight) hours as needed for pain.   11/07/2021   polyethylene glycol (MIRALAX / GLYCOLAX) 17 g packet Take 17 g by mouth daily.   11/08/2021   traMADol (ULTRAM) 50 MG tablet Take 50-100 mg by mouth every 6 (six) hours as needed for pain.   11/06/2021   Allergies:  Allergies  Allergen Reactions   Pollen Extract Other (See Comments)    Sinus congestion    Family History  Problem Relation Age of Onset   Stroke Mother    Social History:  reports that he has never smoked. He has never used smokeless tobacco. He reports that he does not drink alcohol and does not use drugs.  ROS: A complete review of systems was performed.  All systems are negative except for pertinent findings as noted. Review of Systems  All other systems reviewed and are negative.   Physical Exam:  Vital signs in last 24 hours: Temp:  [97.9 F (36.6 C)-98 F (36.7 C)] 98 F (36.7 C) (11/23 1421) Pulse Rate:  [66-73] 71 (11/23 1421) Resp:  [16-18] 18 (11/23 1421) BP: (162-191)/(95-140) 177/95 (11/23 1421) SpO2:  [94 %-99 %] 94 % (11/23 1421) Weight:  [63.5 kg] 63.5 kg (11/22 2142) General:  Alert and oriented, No acute distress HEENT: Normocephalic, atraumatic Cardiovascular: Regular rate and rhythm Lungs: Regular rate and effort Abdomen: Soft, nontender, nondistended, no abdominal masses, colostomy  Back: No CVA tenderness Extremities: No edema Neurologic: Grossly intact GU: Foley catheter in place, 200 cc of urine in the bag  Laboratory Data:  Results for orders placed or performed during the hospital encounter of 11/09/21 (from the past 24 hour(s))  Basic metabolic panel     Status: Abnormal   Collection Time: 11/09/21 10:43 PM  Result Value Ref Range    Sodium 138 135 - 145 mmol/L   Potassium 4.9 3.5 - 5.1 mmol/L   Chloride 106 98 - 111 mmol/L   CO2 23 22 - 32 mmol/L   Glucose, Bld 93 70 - 99 mg/dL   BUN 34 (H) 8 - 23 mg/dL   Creatinine, Ser 2.39 (H) 0.61 - 1.24 mg/dL   Calcium 8.6 (L) 8.9 - 10.3 mg/dL   GFR, Estimated 27 (L) >60 mL/min   Anion gap 9 5 - 15  CBC with Differential     Status: Abnormal   Collection Time: 11/09/21 10:43 PM  Result Value Ref Range   WBC 17.1 (H) 4.0 - 10.5 K/uL   RBC 4.30 4.22 - 5.81 MIL/uL   Hemoglobin 12.9 (L) 13.0 - 17.0 g/dL   HCT 39.8 39.0 - 52.0 %   MCV 92.6 80.0 - 100.0 fL   MCH 30.0 26.0 - 34.0 pg   MCHC 32.4 30.0 - 36.0 g/dL   RDW 13.8 11.5 - 15.5 %   Platelets 439 (H) 150 - 400 K/uL   nRBC 0.0  0.0 - 0.2 %   Neutrophils Relative % 74 %   Neutro Abs 12.8 (H) 1.7 - 7.7 K/uL   Lymphocytes Relative 12 %   Lymphs Abs 2.0 0.7 - 4.0 K/uL   Monocytes Relative 11 %   Monocytes Absolute 1.9 (H) 0.1 - 1.0 K/uL   Eosinophils Relative 2 %   Eosinophils Absolute 0.3 0.0 - 0.5 K/uL   Basophils Relative 0 %   Basophils Absolute 0.1 0.0 - 0.1 K/uL   Immature Granulocytes 1 %   Abs Immature Granulocytes 0.08 (H) 0.00 - 0.07 K/uL  Urinalysis, Routine w reflex microscopic Urine, Clean Catch     Status: Abnormal   Collection Time: 11/10/21 12:14 AM  Result Value Ref Range   Color, Urine YELLOW YELLOW   APPearance HAZY (A) CLEAR   Specific Gravity, Urine 1.020 1.005 - 1.030   pH 5.5 5.0 - 8.0   Glucose, UA NEGATIVE NEGATIVE mg/dL   Hgb urine dipstick LARGE (A) NEGATIVE   Bilirubin Urine NEGATIVE NEGATIVE   Ketones, ur NEGATIVE NEGATIVE mg/dL   Protein, ur 100 (A) NEGATIVE mg/dL   Nitrite NEGATIVE NEGATIVE   Leukocytes,Ua TRACE (A) NEGATIVE  Urinalysis, Microscopic (reflex)     Status: Abnormal   Collection Time: 11/10/21 12:14 AM  Result Value Ref Range   RBC / HPF >50 0 - 5 RBC/hpf   WBC, UA 21-50 0 - 5 WBC/hpf   Bacteria, UA RARE (A) NONE SEEN   Squamous Epithelial / LPF 0-5 0 - 5  Lactic  acid, plasma     Status: None   Collection Time: 11/10/21 12:50 AM  Result Value Ref Range   Lactic Acid, Venous 0.8 0.5 - 1.9 mmol/L  Resp Panel by RT-PCR (Flu A&B, Covid) Nasopharyngeal Swab     Status: None   Collection Time: 11/10/21  1:10 AM   Specimen: Nasopharyngeal Swab; Nasopharyngeal(NP) swabs in vial transport medium  Result Value Ref Range   SARS Coronavirus 2 by RT PCR NEGATIVE NEGATIVE   Influenza A by PCR NEGATIVE NEGATIVE   Influenza B by PCR NEGATIVE NEGATIVE  Lactic acid, plasma     Status: None   Collection Time: 11/10/21  3:32 AM  Result Value Ref Range   Lactic Acid, Venous 0.7 0.5 - 1.9 mmol/L  Basic metabolic panel     Status: Abnormal   Collection Time: 11/10/21  4:35 AM  Result Value Ref Range   Sodium 136 135 - 145 mmol/L   Potassium 4.9 3.5 - 5.1 mmol/L   Chloride 105 98 - 111 mmol/L   CO2 22 22 - 32 mmol/L   Glucose, Bld 113 (H) 70 - 99 mg/dL   BUN 40 (H) 8 - 23 mg/dL   Creatinine, Ser 3.17 (H) 0.61 - 1.24 mg/dL   Calcium 8.6 (L) 8.9 - 10.3 mg/dL   GFR, Estimated 19 (L) >60 mL/min   Anion gap 9 5 - 15  CBC     Status: Abnormal   Collection Time: 11/10/21  4:35 AM  Result Value Ref Range   WBC 19.5 (H) 4.0 - 10.5 K/uL   RBC 4.48 4.22 - 5.81 MIL/uL   Hemoglobin 13.4 13.0 - 17.0 g/dL   HCT 42.1 39.0 - 52.0 %   MCV 94.0 80.0 - 100.0 fL   MCH 29.9 26.0 - 34.0 pg   MCHC 31.8 30.0 - 36.0 g/dL   RDW 13.8 11.5 - 15.5 %   Platelets 514 (H) 150 - 400 K/uL   nRBC 0.0  0.0 - 0.2 %  Culture, blood (routine x 2)     Status: None (Preliminary result)   Collection Time: 11/10/21  5:30 AM   Specimen: BLOOD  Result Value Ref Range   Specimen Description      BLOOD RIGHT ARM Performed at [facility] [address] [place], [place] [address]    Special Requests      BOTTLES DRAWN AEROBIC ONLY Blood Culture adequate volume Performed at [facility] [address], [place], [place] [address]    Culture      NO GROWTH  < 12 HOURS Performed at [facility] [address] [place], [place] [address]    Report Status PENDING    Recent Results (from the past 240 hour(s))  Resp Panel by RT-PCR (Flu A&B, Covid) Nasopharyngeal Swab     Status: None   Collection Time: 11/10/21  1:10 AM   Specimen: Nasopharyngeal Swab; Nasopharyngeal(NP) swabs in vial transport medium  Result Value Ref Range Status   SARS Coronavirus 2 by RT PCR NEGATIVE NEGATIVE Final    Comment: (NOTE) SARS-CoV-2 target nucleic acids are NOT DETECTED.  The SARS-CoV-2 RNA is generally detectable in upper respiratory specimens during the acute phase of infection. The lowest concentration of SARS-CoV-2 viral copies this assay can detect is 138 copies/mL. A negative result does not preclude SARS-Cov-2 infection and should not be used as the sole basis for treatment or other patient management decisions. A negative result may occur with  improper specimen collection/handling, submission of specimen other than nasopharyngeal swab, presence of viral mutation(s) within the areas targeted by this assay, and inadequate number of viral copies(<138 copies/mL). A negative result must be combined with clinical observations, patient history, and epidemiological information. The expected result is Negative.  Fact Sheet for Patients:  EntrepreneurPulse.com.au  Fact Sheet for Healthcare Providers:  IncredibleEmployment.be  This test is no t yet approved or cleared by the [place] FDA and  has been authorized for detection and/or diagnosis of SARS-CoV-2 by FDA under an Emergency Use Authorization (EUA). This EUA will remain  in effect (meaning this test can be used) for the duration of the COVID-19 declaration under Section 564(b)(1) of the Act, 21 U.S.C.section 360bbb-3(b)(1), unless the authorization is terminated  or revoked sooner.       Influenza A by PCR NEGATIVE NEGATIVE Final   Influenza B by  PCR NEGATIVE NEGATIVE Final    Comment: (NOTE) The Xpert Xpress SARS-CoV-2/FLU/RSV plus assay is intended as an aid in the diagnosis of influenza from Nasopharyngeal swab specimens and should not be used as a sole basis for treatment. Nasal washings and aspirates are unacceptable for Xpert Xpress SARS-CoV-2/FLU/RSV testing.  Fact Sheet for Patients: EntrepreneurPulse.com.au  Fact Sheet for Healthcare Providers: IncredibleEmployment.be  This test is not yet approved or cleared by the [place] FDA and has been authorized for detection and/or diagnosis of SARS-CoV-2 by FDA under an Emergency Use Authorization (EUA). This EUA will remain in effect (meaning this test can be used) for the duration of the COVID-19 declaration under Section 564(b)(1) of the Act, 21 U.S.C. section 360bbb-3(b)(1), unless the authorization is terminated or revoked.  Performed at [facility], [address] [place], [place] [address]   Culture, blood (routine x 2)     Status: None (Preliminary result)   Collection Time: 11/10/21  5:30 AM   Specimen: BLOOD  Result Value Ref Range Status   Specimen Description  Final    BLOOD RIGHT ARM Performed at [facility] [address] [place], [place] [address]    Special Requests   Final    BOTTLES DRAWN AEROBIC ONLY Blood Culture adequate volume Performed at [facility] [address] [place], [place] [address]    Culture   Final    NO GROWTH < 12 HOURS Performed at [facility] [address] [place], [place] [address]    Report Status PENDING  Incomplete   Creatinine: Recent Labs    11/09/21 2243 11/10/21 0435  CREATININE 2.39* 3.17*    Impression/Assessment  Chronic renal failure, right renal atrophy with right distal obstruction, new onset left hydro with left mid ureteral stricture or obstruction status post left stent 10/26/2021-unclear if  patient has decreased urine output or was in retention.  He is making urine now that Foley catheter has been placed.  Will monitor closely and make n.p.o. after midnight.  Discussed he may need bilateral nephrostomy tubes if his creatinine continues to worsen and we went over the nature risk and benefits of nephrostomies and he said he would not do a nephrostomy.  We discussed there is a real risk of the need for dialysis and he said he would not do dialysis.  We discussed this could be life-threatening.  The only other option may be stent change and upsizing the stent.  Continue gentle hydration and n.p.o. after midnight.  We will follow.  Will also start tamsuosin. Discussed with Dr. Lenord Carbo Sephira Zellman 11/10/2021, 3:00 PM

## 2021-11-10 NOTE — Assessment & Plan Note (Addendum)
In setting of recent ureteral obstruction, s/p left ureteral stent placement. Empirically started on Ceftriaxone IV. Urine and blood cultures obtained on admission. Urine culture significant for staph epidermidis which I am not convinced is the culprit bacteria. Repeat culture pending on discharge, but with improvement, will transition to Cefdinir on discharge to complete a 7 day course of antibiotics post-nephrostomy tube placement.

## 2021-11-10 NOTE — Assessment & Plan Note (Signed)
Noted  

## 2021-11-10 NOTE — H&P (Signed)
History and Physical    Dequincy Born AOZ:[id number] DOB: [birth year] DOA: 11/09/2021  PCP: Dettinger, [name], MD   Patient coming from: Home via [facility] ER  Chief Complaint: Flank pain, decreased urine output  HPI: Kevin Villegas is a 80 y.o. male with medical history significant for  pancreatic cancer with metastatic disease that was complicated by distal bowel obstruction status post diverting colectomy, CKD stage 4, recent left-sided ureteral stent placement for obstruction who presents with a sudden onset of left flank pain that he was severe when it started.  He reports pain began around 4 PM with no aggravating factors.  He denies any injury to his back or abdomen.  He states at the time it started he also had urinary retention and was not able to urinate well.  He states he felt the same way when he was found to have a urinary obstruction a few weeks ago that required urological procedure with stent placement.  He is very concerned that the stent has become obstructed.  He has not had any fevers chills chest pain shortness of breath cough nausea vomiting or diarrhea.  He is followed by oncology but is currently not undergoing chemotherapy.  ED Course: Patient is found to have an elevated white blood cell count of 17,000 in the emergency room.  With the symptoms of left flank pain there was concern for pyelonephritis.  Imaging showed increased hydronephrosis and swelling of the left kidney.  ER provider discussed with urology who did not feel that the stent was obstructed ordered a neurological procedure was needed both patient having severe pain and elevated white blood cell count pyelonephritis is suspected but obstruction cannot be ruled out.  Hospital service asked to admit for further management.  Patient was started on IV fluids and antibiotics in the emergency room.  Patient will need urology consult in the morning  Review of Systems:  General: Denies fever, chills, weight loss, night  sweats.  Denies dizziness.  Denies change in appetite HENT: Denies head trauma, headache, denies change in hearing, tinnitus.  Denies nasal congestion or bleeding.  Denies sore throat, sores in mouth.  Denies difficulty swallowing Eyes: Denies blurry vision, pain in eye, drainage.  Denies discoloration of eyes. Neck: Denies pain.  Denies swelling.  Denies pain with movement. Cardiovascular: Denies chest pain, palpitations.  Denies edema.  Denies orthopnea Respiratory: Denies shortness of breath, cough.  Denies wheezing.  Denies sputum production Gastrointestinal: Denies abdominal pain, swelling.  Denies nausea, vomiting, diarrhea.  Denies melena.  Denies hematemesis. Musculoskeletal: Denies limitation of movement.  Denies deformity or swelling.  Denies pain.  Denies arthralgias or myalgias. Genitourinary: Reports left flank pain with decreased urine output. Denies pelvic pain.  Denies dysuria.  Skin: Denies rash.  Denies petechiae, purpura, ecchymosis. Neurological: Denies headache.  Denies syncope.  Denies seizure activity.  Denies weakness or paresthesia.  Denies slurred speech, drooping face.  Denies visual change. Psychiatric: Denies depression, anxiety.  Denies hallucinations.  Past Medical History:  Diagnosis Date   Colostomy present [facility])    Large bowel obstruction ([place])    Pancreatic cancer [facility])     Past Surgical History:  Procedure Laterality Date   ABDOMINAL SURGERY     BIOPSY  05/20/2021   Procedure: BIOPSY;  Surgeon: Otis Brace, MD;  Location: [facility];  Service: Gastroenterology;;   BOWEL RESECTION     BOWEL RESECTION     CHOLECYSTECTOMY N/A 12/16/2014   Procedure: LAPAROSCOPIC CHOLECYSTECTOMY WITH INTRAOPERATIVE CHOLANGIOGRAM;  Surgeon: Georganna Skeans,  MD;  Location: [facility];  Service: General;  Laterality: N/A;   COLONIC STENT PLACEMENT N/A 05/26/2021   Procedure: COLONIC STENT PLACEMENT;  Surgeon: Clarene Essex, MD;  Location: [facility];  Service: Endoscopy;   Laterality: N/A;   CYSTOSCOPY W/ URETERAL STENT PLACEMENT Left 10/27/2021   Procedure: CYSTOSCOPY WITH RETROGRADE PYELOGRAM/URETERAL STENT PLACEMENT;  Surgeon: Raynelle Bring, MD;  Location: [facility];  Service: Urology;  Laterality: Left;   ESOPHAGOGASTRODUODENOSCOPY N/A 05/20/2021   Procedure: ESOPHAGOGASTRODUODENOSCOPY (EGD);  Surgeon: Otis Brace, MD;  Location: [facility];  Service: Gastroenterology;  Laterality: N/A;   EUS N/A 09/24/2015   Procedure: UPPER ENDOSCOPIC ULTRASOUND (EUS) LINEAR;  Surgeon: Milus Banister, MD;  Location: [facility];  Service: Endoscopy;  Laterality: N/A;   FLEXIBLE SIGMOIDOSCOPY N/A 05/20/2021   Procedure: FLEXIBLE SIGMOIDOSCOPY;  Surgeon: Otis Brace, MD;  Location: [facility];  Service: Gastroenterology;  Laterality: N/A;   FLEXIBLE SIGMOIDOSCOPY N/A 05/26/2021   Procedure: FLEXIBLE SIGMOIDOSCOPY;  Surgeon: Clarene Essex, MD;  Location: [facility];  Service: Endoscopy;  Laterality: N/A;   LAPAROSCOPIC DISTAL PANCREATECTOMY  10/2017   [facility]   LAPAROSCOPIC PARTIAL GASTRECTOMY  10/2017   [facility]   LAPAROSCOPIC SIGMOID COLECTOMY N/A 06/14/2021   Procedure: LAPAROSCOPY WITH MOBILIZATION, PROXIMAL DIVERTING COLOSTOMY, CREATION OF DISTAL MUCOUS FISTULA;  Surgeon: Johnathan Hausen, MD;  Location: [facility];  Service: General;  Laterality: N/A;   LAPAROSCOPIC SPLENECTOMY  10/2017   Lead   VASECTOMY      Social History  reports that he has never smoked. He has never used smokeless tobacco. He reports that he does not drink alcohol and does not use drugs.  Allergies  Allergen Reactions   Pollen Extract Other (See Comments)    Sinus congestion    Family History  Problem Relation Age of Onset   Stroke Mother      Prior to Admission medications   Medication Sig Start Date End Date Taking? Authorizing Provider  polyethylene glycol (MIRALAX / GLYCOLAX) 17 g packet Take 17 g by mouth daily. 10/31/21   Mariel Aloe, MD    Physical Exam: Vitals:    11/09/21 2143 11/09/21 2315 11/09/21 2330 11/10/21 0330  BP: (!) 171/102 (!) 176/140 (!) 188/99 (!) 191/107  Pulse: 73 70 69 66  Resp: 16 17  18   Temp: 98 F (36.7 C)   97.9 F (36.6 C)  TempSrc: Oral   Oral  SpO2: 99% 96% 96% 97%  Weight:      Height:        Constitutional: NAD, calm, comfortable Vitals:   11/09/21 2143 11/09/21 2315 11/09/21 2330 11/10/21 0330  BP: (!) 171/102 (!) 176/140 (!) 188/99 (!) 191/107  Pulse: 73 70 69 66  Resp: 16 17  18   Temp: 98 F (36.7 C)   97.9 F (36.6 C)  TempSrc: Oral   Oral  SpO2: 99% 96% 96% 97%  Weight:      Height:       Constitutional: Resting in bed with head elevated, NAD, calm, comfortable Eyes: PERRL, lids and conjunctivae normal. Sclera nonicteric ENMT: Mucous membranes are moist. Posterior pharynx clear of any exudate or lesions.Normal dentition.  Neck: normal, supple, no masses. Respiratory: clear to auscultation bilaterally, no wheezing, no crackles. Normal respiratory effort. No accessory muscle use.  Cardiovascular: Regular rate and rhythm, no murmurs / rubs / gallops. No extremity edema. 2+ pedal pulses. Abdomen: Colostomy in place.  No abdominal tenderness, no masses palpated. No hepatosplenomegaly. Left CVA tenderness to percussion. Musculoskeletal: FROM. no  cyanosis. No joint deformity upper and lower extremities. Good ROM, no contractures. Normal muscle tone. Port in right upper chest. Skin: no rashes, lesions, ulcers. No induration Neurologic: CN 2-12 grossly intact. Sensation intact. Strength 5/5 in all 4.  Psychiatric: Normal judgment and insight. Alert and oriented x 3. Normal mood.    Labs on Admission: I have personally reviewed following labs and imaging studies  CBC: Recent Labs  Lab 11/09/21 2243  WBC 17.1*  NEUTROABS 12.8*  HGB 12.9*  HCT 39.8  MCV 92.6  PLT 439*    Basic Metabolic Panel: Recent Labs  Lab 11/09/21 2243  NA 138  K 4.9  CL 106  CO2 23  GLUCOSE 93  BUN 34*  CREATININE  2.39*  CALCIUM 8.6*    GFR: Estimated Creatinine Clearance: 22.5 mL/min (A) (by C-G formula based on SCr of 2.39 mg/dL (H)).  Liver Function Tests: No results for input(s): AST, ALT, ALKPHOS, BILITOT, PROT, ALBUMIN in the last 168 hours.  Urine analysis:    Component Value Date/Time   COLORURINE YELLOW 11/10/2021 0014   APPEARANCEUR HAZY (A) 11/10/2021 0014   LABSPEC 1.020 11/10/2021 0014   PHURINE 5.5 11/10/2021 0014   GLUCOSEU NEGATIVE 11/10/2021 0014   HGBUR LARGE (A) 11/10/2021 0014   BILIRUBINUR NEGATIVE 11/10/2021 0014   KETONESUR NEGATIVE 11/10/2021 0014   PROTEINUR 100 (A) 11/10/2021 0014   UROBILINOGEN 0.2 08/23/2015 1555   NITRITE NEGATIVE 11/10/2021 0014   LEUKOCYTESUR TRACE (A) 11/10/2021 0014    Radiological Exams on Admission: CT Renal Stone Study  Result Date: 11/09/2021 CLINICAL DATA:  Flank pain, kidney stone suspected recent stent in left kidney. need to evaluate placement EXAM: CT ABDOMEN AND PELVIS WITHOUT CONTRAST TECHNIQUE: Multidetector CT imaging of the abdomen and pelvis was performed following the standard protocol without IV contrast. COMPARISON:  CT 10/26/2021.  Renal ultrasound 10/27/2021 FINDINGS: Lower chest: Multiple bilateral basilar pulmonary nodules. Cavitary nodule in the left lower lobe. Many of these nodules were not previously included in the field of view. There is increased size of a right lower lobe nodule currently measuring 12 by 6 mm, series 3, image 4, previously 11 x 5 mm. Small left pleural effusion is similar. Hepatobiliary: Stable fluid density lesions in the liver consistent with cysts. Some of the smaller low-density lesions are too small to characterize. No new liver lesion. Cholecystectomy. No biliary dilatation. Pancreas: Soft tissue density in the region of the pancreatic body adjacent to duodenal stent measures approximately 3 x 3.8 cm using same measurement technique, previously 4.1 x 3.5 cm. The remainder the pancreas is  difficult to delineate. Spleen: Surgically absent. Adrenals/Urinary Tract: No adrenal nodule. Left nephroureteral stent in place with proximal pigtail in the renal pelvis and distal pigtail in the bladder. Despite appropriate stent placement there is persistent moderate left hydroureteronephrosis. Left perinephric edema has slightly increased. Small focus of air in the left renal collecting system is likely related to stent placement. No stone or stone fragments along the course of the stent or in the bladder. Urinary bladder is partially distended. Chronic moderate right hydronephrosis with renal parenchymal atrophy. Unchanged right renal cyst. Stable 6 mm stone in the distal right ureter, series 2, image 60. Stomach/Bowel: Bowel assessment is limited in the absence of enteric contrast and paucity of intra-abdominal fat. Postsurgical change of the stomach. Duodenal stent is unchanged in position. Proximal small bowel is nondilated. Mid small bowel is prominent with fecalization of small bowel contents. More distal small bowel is decompressed. There  is no mesenteric inflammation or swirling of mesenteric vasculature. Multifocal colonic diverticulosis. No acute diverticulitis. Moderate colonic stool burden. Descending colostomy without peristomal hernia. Stapled off sigmoid colon in the pelvis. Vascular/Lymphatic: Mild aortic atherosclerosis. No aortic aneurysm. Limited assessment for adenopathy. No definite bulky enlarged abdominopelvic lymph nodes. Reproductive: Prominent prostate.  Left inguinal testis. Other: Trace free fluid in the pelvis. No free air. Postsurgical change of the abdominal wall. Tiny fat containing umbilical hernia. Musculoskeletal: Degenerative change throughout the spine and both hips. No acute osseous findings. IMPRESSION: 1. Left nephroureteral stent in place with persistent moderate left hydroureteronephrosis. Left perinephric edema has slightly increased. Small focus of air in the left  renal collecting system is likely related to stent placement. No stone or stone fragments along the course of the stent or in the bladder. 2. Chronic moderate right hydronephrosis with renal parenchymal atrophy. Stable 6 mm stone in the distal right ureter. 3. Soft tissue density in the region of the pancreatic body adjacent to duodenal stent is unchanged in size from prior CT. 4. Multiple bilateral basilar pulmonary nodules consistent with pulmonary metastasis. Small left pleural effusion is similar. 5. Colonic diverticulosis without diverticulitis. 6. Fecalization of small bowel contents with slight increased diameter of distal small bowel suggests slow transit. No bowel obstruction. Aortic Atherosclerosis (ICD10-I70.0). Electronically Signed   By: Keith Rake M.D.   On: 11/09/2021 22:33    Assessment/Plan Active Problems:   Pyelonephritis Mr. Munn is placed on Medical floor for observation. Started on Rocephin with clinical picture of pyelonephritis with left flank pain, leukocytosis and decreased urinary output.  Patient has increased hydronephrosis on the left side on imaging.  Patient has risk factors with retained stone and stent in place with recent urological procedure. IV fluid hydration with LR at 100 ml/hr Pain control provided with dilaudid for severe pain. Cultures were obtained in the emergency room and will be monitored.  Antibiotic will be adjusted as indicated by culture    CKD (chronic kidney disease) stage 4, GFR 15-29 ml/min  Stable kidney function.  Continue to monitor with repeat labs in morning    Essential hypertension Patient with elevated blood pressure reading.  We will start low-dose Norvasc and monitor blood pressure    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  with metastatic disease Chronic.  Followed by oncology    Colostomy in place Functioning normally per patient   DVT prophylaxis: Heparin for DVT prophylaxis.  Code Status:   Full Code   Family Communication:  Diagnosis and plan discussed with patient.  He verbalized understanding and agrees with plan.  Further recommendations to follow as clinical indicated Disposition Plan:   Patient is from:  Home  Anticipated DC to:  Home    Admission status:  Observation   Eben Burow MD [facility]  How to contact the [facility] Attending or Consulting provider [place] or covering provider during after hours [place], for this patient?   Check the care team in [facility] and look for a) attending/consulting [facility] provider listed and b) the [facility] team listed Log into [email or web address] and use [place]'s universal password to access. If you do not have the password, please contact the hospital operator. Locate the [facility] provider you are looking for under [facility] and page to a number that you can be directly reached. If you still have difficulty reaching the provider, please page the [facility] (Director on Call) for the Hospitalists listed on amion for assistance.  11/10/2021, 3:55  AM

## 2021-11-10 NOTE — Progress Notes (Signed)
PROGRESS NOTE    Kevin Villegas  XIP:[id number] DOB: 12-Apr-1941 DOA: 11/09/2021 PCP: Dettinger, [name], MD   Brief Narrative: Kevin Villegas is a 80 y.o. male with a history of metastatic pancreatic cancer, SBO s/p diverting colostomy, CKD stage IIIa, chronic right nephrolithiasis with associated chronic right hydronephrosis and recent left hydronephrosis with AKI. Patient presented with left flank pain and decreased urine output and found to have evidence of persistent left sided hydronephrosis and left pyelonephritis. Empiric Ceftriaxone initiated. Blood and urine cultures obtained and are pending. Patient anuric; urology consulted.   Assessment & Plan:   * Acute pyelonephritis In setting of recent ureteral obstruction, s/p left ureteral stent placement. Empirically started on Ceftriaxone IV. Urine and blood cultures obtained on admission and are pending -Continue Ceftriaxone IV -Follow-up culture data  Acute kidney injury superimposed on chronic kidney disease ([place]) In setting of recent ureteral obstruction and is s/p ureteral stent. Persistent hydronephrosis. Currently anuric. Bladder scan per nursing with zero -Decrease to LR @ 50 mL/hr -Consult urology first, and if no concern for obstruction, will need nephrology consult -Strict in/out  Right ureteral stone Chronic issue. Stable. Associated right renal atrophy.  Colostomy in place [facility]) Noted.  Essential hypertension Blood pressure uncontrolled. Started on amlodipine 5 mg daily on admission -Continue amlodipine; titrate up as needed  Hydronephrosis of left kidney Persistent s/p ureteral stent from prior admission. Increased creatinine. -Urology consult  Stage 3a chronic kidney disease (CKD) ([place]) Baseline creatinine of about 1.3-1.6      DVT prophylaxis: Heparin subq Code Status:   Code Status: Full Code Family Communication: None at bedside Disposition Plan: Discharge home likely in several days pending  continued treatment of pyelonephritis, urology recommendations, improvement of urine output/creatinine   Consultants:  Urology  Procedures:  Foley Catheter (11/23 >>  Antimicrobials: Ceftriaxone IV    Subjective: No urine output this morning. Left flank pain. Scared about his current urologic issues.  Objective: Vitals:   11/09/21 2330 11/10/21 0330 11/10/21 0910 11/10/21 1421  BP: (!) 188/99 (!) 191/107 (!) 162/102 (!) 177/95  Pulse: 69 66 67 71  Resp:  18 17 18   Temp:  97.9 F (36.6 C) 98 F (36.7 C) 98 F (36.7 C)  TempSrc:  Oral Oral Oral  SpO2: 96% 97% 96% 94%  Weight:      Height:        Intake/Output Summary (Last 24 hours) at 11/10/2021 1629 Last data filed at 11/10/2021 6734 Gross per 24 hour  Intake 406.46 ml  Output --  Net 406.46 ml   Filed Weights   11/09/21 2142  Weight: 63.5 kg    Examination:  General exam: Appears comfortable  Respiratory system: Clear to auscultation. Respiratory effort normal. Cardiovascular system: S1 & S2 heard, RRR. No murmurs, rubs, gallops or clicks. Gastrointestinal system: Abdomen is nondistended, soft and nontender. No organomegaly or masses felt. Normal bowel sounds heard. Colostomy bag with stool/air Central nervous system: Alert and oriented. No focal neurological deficits. Musculoskeletal: No edema. No calf tenderness Skin: No cyanosis. No rashes Psychiatry: Judgement and insight appear normal. Anxious.    Data Reviewed: I have personally reviewed following labs and imaging studies  CBC Lab Results  Component Value Date   WBC 19.5 (H) 11/10/2021   RBC 4.48 11/10/2021   HGB 13.4 11/10/2021   HCT 42.1 11/10/2021   MCV 94.0 11/10/2021   MCH 29.9 11/10/2021   PLT 514 (H) 11/10/2021   MCHC 31.8 11/10/2021   RDW 13.8 11/10/2021  LYMPHSABS 2.0 11/09/2021   MONOABS 1.9 (H) 11/09/2021   EOSABS 0.3 11/09/2021   BASOSABS 0.1 82/95/6213     Last metabolic panel Lab Results  Component Value Date   NA  136 11/10/2021   K 4.9 11/10/2021   CL 105 11/10/2021   CO2 22 11/10/2021   BUN 40 (H) 11/10/2021   CREATININE 3.17 (H) 11/10/2021   GLUCOSE 113 (H) 11/10/2021   GFRNONAA 19 (L) 11/10/2021   GFRAA >60 11/24/2019   CALCIUM 8.6 (L) 11/10/2021   PHOS 3.1 06/16/2021   PROT 7.0 10/27/2021   ALBUMIN 3.1 (L) 10/27/2021   LABGLOB 3.1 10/13/2021   AGRATIO 1.4 10/13/2021   BILITOT 0.3 10/27/2021   ALKPHOS 50 10/27/2021   AST 13 (L) 10/27/2021   ALT 16 10/27/2021   ANIONGAP 9 11/10/2021    CBG (last 3)  No results for input(s): GLUCAP in the last 72 hours.   GFR: Estimated Creatinine Clearance: 17 mL/min (A) (by C-G formula based on SCr of 3.17 mg/dL (H)).  Coagulation Profile: No results for input(s): INR, PROTIME in the last 168 hours.  Recent Results (from the past 240 hour(s))  Resp Panel by RT-PCR (Flu A&B, Covid) Nasopharyngeal Swab     Status: None   Collection Time: 11/10/21  1:10 AM   Specimen: Nasopharyngeal Swab; Nasopharyngeal(NP) swabs in vial transport medium  Result Value Ref Range Status   SARS Coronavirus 2 by RT PCR NEGATIVE NEGATIVE Final    Comment: (NOTE) SARS-CoV-2 target nucleic acids are NOT DETECTED.  The SARS-CoV-2 RNA is generally detectable in upper respiratory specimens during the acute phase of infection. The lowest concentration of SARS-CoV-2 viral copies this assay can detect is 138 copies/mL. A negative result does not preclude SARS-Cov-2 infection and should not be used as the sole basis for treatment or other patient management decisions. A negative result may occur with  improper specimen collection/handling, submission of specimen other than nasopharyngeal swab, presence of viral mutation(s) within the areas targeted by this assay, and inadequate number of viral copies(<138 copies/mL). A negative result must be combined with clinical observations, patient history, and epidemiological information. The expected result is Negative.  Fact  Sheet for Patients:  EntrepreneurPulse.com.au  Fact Sheet for Healthcare Providers:  IncredibleEmployment.be  This test is no t yet approved or cleared by the [place] FDA and  has been authorized for detection and/or diagnosis of SARS-CoV-2 by FDA under an Emergency Use Authorization (EUA). This EUA will remain  in effect (meaning this test can be used) for the duration of the COVID-19 declaration under Section 564(b)(1) of the Act, 21 U.S.C.section 360bbb-3(b)(1), unless the authorization is terminated  or revoked sooner.       Influenza A by PCR NEGATIVE NEGATIVE Final   Influenza B by PCR NEGATIVE NEGATIVE Final    Comment: (NOTE) The Xpert Xpress SARS-CoV-2/FLU/RSV plus assay is intended as an aid in the diagnosis of influenza from Nasopharyngeal swab specimens and should not be used as a sole basis for treatment. Nasal washings and aspirates are unacceptable for Xpert Xpress SARS-CoV-2/FLU/RSV testing.  Fact Sheet for Patients: EntrepreneurPulse.com.au  Fact Sheet for Healthcare Providers: IncredibleEmployment.be  This test is not yet approved or cleared by the [place] FDA and has been authorized for detection and/or diagnosis of SARS-CoV-2 by FDA under an Emergency Use Authorization (EUA). This EUA will remain in effect (meaning this test can be used) for the duration of the COVID-19 declaration under Section 564(b)(1) of the Act, 21 U.S.C.  section 360bbb-3(b)(1), unless the authorization is terminated or revoked.  Performed at [facility], [address] [place], [place] [address]   Culture, blood (routine x 2)     Status: None (Preliminary result)   Collection Time: 11/10/21  5:30 AM   Specimen: BLOOD  Result Value Ref Range Status   Specimen Description   Final    BLOOD RIGHT ARM Performed at [facility] [address], [place], [place]  [address]    Special Requests   Final    BOTTLES DRAWN AEROBIC ONLY Blood Culture adequate volume Performed at [facility] [address] [place], [place] [address]    Culture   Final    NO GROWTH < 12 HOURS Performed at [facility] [address], [place], [place] [address]    Report Status PENDING  Incomplete        Radiology Studies: CT Renal Stone Study  Result Date: 11/09/2021 CLINICAL DATA:  Flank pain, kidney stone suspected recent stent in left kidney. need to evaluate placement EXAM: CT ABDOMEN AND PELVIS WITHOUT CONTRAST TECHNIQUE: Multidetector CT imaging of the abdomen and pelvis was performed following the standard protocol without IV contrast. COMPARISON:  CT 10/26/2021.  Renal ultrasound 10/27/2021 FINDINGS: Lower chest: Multiple bilateral basilar pulmonary nodules. Cavitary nodule in the left lower lobe. Many of these nodules were not previously included in the field of view. There is increased size of a right lower lobe nodule currently measuring 12 by 6 mm, series 3, image 4, previously 11 x 5 mm. Small left pleural effusion is similar. Hepatobiliary: Stable fluid density lesions in the liver consistent with cysts. Some of the smaller low-density lesions are too small to characterize. No new liver lesion. Cholecystectomy. No biliary dilatation. Pancreas: Soft tissue density in the region of the pancreatic body adjacent to duodenal stent measures approximately 3 x 3.8 cm using same measurement technique, previously 4.1 x 3.5 cm. The remainder the pancreas is difficult to delineate. Spleen: Surgically absent. Adrenals/Urinary Tract: No adrenal nodule. Left nephroureteral stent in place with proximal pigtail in the renal pelvis and distal pigtail in the bladder. Despite appropriate stent placement there is persistent moderate left hydroureteronephrosis. Left perinephric edema has slightly increased. Small focus of air in the left renal collecting system is  likely related to stent placement. No stone or stone fragments along the course of the stent or in the bladder. Urinary bladder is partially distended. Chronic moderate right hydronephrosis with renal parenchymal atrophy. Unchanged right renal cyst. Stable 6 mm stone in the distal right ureter, series 2, image 60. Stomach/Bowel: Bowel assessment is limited in the absence of enteric contrast and paucity of intra-abdominal fat. Postsurgical change of the stomach. Duodenal stent is unchanged in position. Proximal small bowel is nondilated. Mid small bowel is prominent with fecalization of small bowel contents. More distal small bowel is decompressed. There is no mesenteric inflammation or swirling of mesenteric vasculature. Multifocal colonic diverticulosis. No acute diverticulitis. Moderate colonic stool burden. Descending colostomy without peristomal hernia. Stapled off sigmoid colon in the pelvis. Vascular/Lymphatic: Mild aortic atherosclerosis. No aortic aneurysm. Limited assessment for adenopathy. No definite bulky enlarged abdominopelvic lymph nodes. Reproductive: Prominent prostate.  Left inguinal testis. Other: Trace free fluid in the pelvis. No free air. Postsurgical change of the abdominal wall. Tiny fat containing umbilical hernia. Musculoskeletal: Degenerative change throughout the spine and both hips. No acute osseous findings. IMPRESSION: 1. Left nephroureteral stent in place with persistent moderate  left hydroureteronephrosis. Left perinephric edema has slightly increased. Small focus of air in the left renal collecting system is likely related to stent placement. No stone or stone fragments along the course of the stent or in the bladder. 2. Chronic moderate right hydronephrosis with renal parenchymal atrophy. Stable 6 mm stone in the distal right ureter. 3. Soft tissue density in the region of the pancreatic body adjacent to duodenal stent is unchanged in size from prior CT. 4. Multiple bilateral  basilar pulmonary nodules consistent with pulmonary metastasis. Small left pleural effusion is similar. 5. Colonic diverticulosis without diverticulitis. 6. Fecalization of small bowel contents with slight increased diameter of distal small bowel suggests slow transit. No bowel obstruction. Aortic Atherosclerosis (ICD10-I70.0). Electronically Signed   By: Keith Rake M.D.   On: 11/09/2021 22:33        Scheduled Meds:  amLODipine  5 mg Oral Daily   Chlorhexidine Gluconate Cloth  6 each Topical Daily   heparin  5,000 Units Subcutaneous Q8H   tamsulosin  0.4 mg Oral QPC supper   Continuous Infusions:  [START ON 11/11/2021] cefTRIAXone (ROCEPHIN)  IV     lactated ringers 50 mL/hr at 11/10/21 0937     LOS: 0 days     Cordelia Poche, MD [facility] 11/10/2021, 4:29 PM  If 7PM-7AM, please contact night-coverage [email or web address]

## 2021-11-10 NOTE — Assessment & Plan Note (Signed)
Baseline creatinine of about 1.3-1.6

## 2021-11-10 NOTE — Hospital Course (Addendum)
Kevin Villegas is a 80 y.o. male with a history of metastatic pancreatic cancer, SBO s/p diverting colostomy, CKD stage IIIa, chronic right nephrolithiasis with associated chronic right hydronephrosis and recent left hydronephrosis with AKI. Patient presented with left flank pain and decreased urine output and found to have evidence of persistent left sided hydronephrosis and left pyelonephritis. Empiric Ceftriaxone initiated. Blood and urine cultures obtained and are pending. Patient initially oliguric; urology consulted. Bilateral percutaneous nephrostomy tubes placed on 11/24.

## 2021-11-11 ENCOUNTER — Encounter (HOSPITAL_COMMUNITY)
Admission: EM | Disposition: A | Discharge status: Home / Self Care | Payer: Self-pay | Source: Home / Self Care | Attending: Family Medicine

## 2021-11-11 ENCOUNTER — Inpatient Hospital Stay (HOSPITAL_COMMUNITY): Payer: Medicare Other

## 2021-11-11 ENCOUNTER — Encounter (HOSPITAL_COMMUNITY): Payer: Self-pay | Admitting: Family Medicine

## 2021-11-11 ENCOUNTER — Encounter (HOSPITAL_COMMUNITY): Payer: Self-pay | Admitting: Anesthesiology

## 2021-11-11 DIAGNOSIS — E875 Hyperkalemia: Secondary | ICD-10-CM | POA: Diagnosis present

## 2021-11-11 DIAGNOSIS — N179 Acute kidney failure, unspecified: Secondary | ICD-10-CM

## 2021-11-11 DIAGNOSIS — Z90411 Acquired partial absence of pancreas: Secondary | ICD-10-CM | POA: Diagnosis not present

## 2021-11-11 DIAGNOSIS — Z823 Family history of stroke: Secondary | ICD-10-CM | POA: Diagnosis not present

## 2021-11-11 DIAGNOSIS — Z87442 Personal history of urinary calculi: Secondary | ICD-10-CM | POA: Diagnosis not present

## 2021-11-11 DIAGNOSIS — Z9081 Acquired absence of spleen: Secondary | ICD-10-CM | POA: Diagnosis not present

## 2021-11-11 DIAGNOSIS — Z8507 Personal history of malignant neoplasm of pancreas: Secondary | ICD-10-CM | POA: Diagnosis not present

## 2021-11-11 DIAGNOSIS — R34 Anuria and oliguria: Secondary | ICD-10-CM | POA: Diagnosis present

## 2021-11-11 DIAGNOSIS — N136 Pyonephrosis: Secondary | ICD-10-CM | POA: Diagnosis present

## 2021-11-11 DIAGNOSIS — K5900 Constipation, unspecified: Secondary | ICD-10-CM

## 2021-11-11 DIAGNOSIS — Z20822 Contact with and (suspected) exposure to covid-19: Secondary | ICD-10-CM | POA: Diagnosis present

## 2021-11-11 DIAGNOSIS — N1831 Chronic kidney disease, stage 3a: Secondary | ICD-10-CM

## 2021-11-11 DIAGNOSIS — N390 Urinary tract infection, site not specified: Secondary | ICD-10-CM | POA: Diagnosis not present

## 2021-11-11 DIAGNOSIS — Z933 Colostomy status: Secondary | ICD-10-CM | POA: Diagnosis not present

## 2021-11-11 DIAGNOSIS — N12 Tubulo-interstitial nephritis, not specified as acute or chronic: Secondary | ICD-10-CM | POA: Diagnosis present

## 2021-11-11 DIAGNOSIS — I129 Hypertensive chronic kidney disease with stage 1 through stage 4 chronic kidney disease, or unspecified chronic kidney disease: Secondary | ICD-10-CM | POA: Diagnosis present

## 2021-11-11 DIAGNOSIS — N189 Chronic kidney disease, unspecified: Secondary | ICD-10-CM

## 2021-11-11 DIAGNOSIS — I10 Essential (primary) hypertension: Secondary | ICD-10-CM | POA: Diagnosis not present

## 2021-11-11 DIAGNOSIS — N202 Calculus of kidney with calculus of ureter: Secondary | ICD-10-CM | POA: Diagnosis present

## 2021-11-11 DIAGNOSIS — N1330 Unspecified hydronephrosis: Secondary | ICD-10-CM

## 2021-11-11 DIAGNOSIS — N10 Acute tubulo-interstitial nephritis: Secondary | ICD-10-CM | POA: Diagnosis not present

## 2021-11-11 DIAGNOSIS — N184 Chronic kidney disease, stage 4 (severe): Secondary | ICD-10-CM | POA: Diagnosis present

## 2021-11-11 HISTORY — PX: IR NEPHROSTOMY PLACEMENT RIGHT: IMG6064

## 2021-11-11 HISTORY — PX: IR NEPHROSTOMY PLACEMENT LEFT: IMG6063

## 2021-11-11 LAB — BASIC METABOLIC PANEL
Anion gap: 10 (ref 5–15)
Anion gap: 11 (ref 5–15)
BUN: 63 mg/dL — ABNORMAL HIGH (ref 8–23)
BUN: 64 mg/dL — ABNORMAL HIGH (ref 8–23)
CO2: 21 mmol/L — ABNORMAL LOW (ref 22–32)
CO2: 19 mmol/L — ABNORMAL LOW (ref 22–32)
Calcium: 8 mg/dL — ABNORMAL LOW (ref 8.9–10.3)
Calcium: 8.1 mg/dL — ABNORMAL LOW (ref 8.9–10.3)
Chloride: 103 mmol/L (ref 98–111)
Chloride: 106 mmol/L (ref 98–111)
Creatinine, Ser: 6.24 mg/dL — ABNORMAL HIGH (ref 0.61–1.24)
Creatinine, Ser: 6.09 mg/dL — ABNORMAL HIGH (ref 0.61–1.24)
GFR, Estimated: 9 mL/min — ABNORMAL LOW (ref >60)
GFR, Estimated: 9 mL/min — ABNORMAL LOW (ref >60)
Glucose, Bld: 100 mg/dL — ABNORMAL HIGH (ref 70–99)
Glucose, Bld: 155 mg/dL — ABNORMAL HIGH (ref 70–99)
Potassium: 5.9 mmol/L — ABNORMAL HIGH (ref 3.5–5.1)
Potassium: 5.2 mmol/L — ABNORMAL HIGH (ref 3.5–5.1)
Sodium: 134 mmol/L — ABNORMAL LOW (ref 135–145)
Sodium: 136 mmol/L (ref 135–145)

## 2021-11-11 LAB — URINE CULTURE: Culture: 10000 — AB

## 2021-11-11 LAB — PROTIME-INR
INR: 1.2 (ref 0.8–1.2)
Prothrombin Time: 14.9 seconds (ref 11.4–15.2)

## 2021-11-11 LAB — POTASSIUM: Potassium: 5.5 mmol/L — ABNORMAL HIGH (ref 3.5–5.1)

## 2021-11-11 SURGERY — CYSTOSCOPY, WITH STENT INSERTION
Anesthesia: Choice | Laterality: Left

## 2021-11-11 MED ORDER — LIDOCAINE HCL (PF) 1 % IJ SOLN
20.0000 mL | Freq: Once | INTRAMUSCULAR | Status: DC
  Filled 2021-11-11: qty 20

## 2021-11-11 MED ORDER — SODIUM CHLORIDE 0.9% FLUSH: 10.0000 mL | INTRAVENOUS | Status: DC | PRN

## 2021-11-11 MED ORDER — DEXAMETHASONE SODIUM PHOSPHATE 10 MG/ML IJ SOLN
INTRAMUSCULAR | Status: AC
  Filled 2021-11-11: qty 1

## 2021-11-11 MED ORDER — MIDAZOLAM HCL 2 MG/2ML IJ SOLN
INTRAMUSCULAR | Status: AC
  Filled 2021-11-11: qty 2

## 2021-11-11 MED ORDER — ONDANSETRON HCL 4 MG/2ML IJ SOLN
INTRAMUSCULAR | Status: AC
  Filled 2021-11-11: qty 2

## 2021-11-11 MED ORDER — FENTANYL CITRATE (PF) 100 MCG/2ML IJ SOLN
INTRAMUSCULAR | Status: AC
  Filled 2021-11-11: qty 2

## 2021-11-11 MED ORDER — PROPOFOL 10 MG/ML IV BOLUS
INTRAVENOUS | Status: AC
  Filled 2021-11-11: qty 20

## 2021-11-11 MED ORDER — SODIUM CHLORIDE 0.9% FLUSH
10.0000 mL | Freq: Two times a day (BID) | INTRAVENOUS | Status: DC
  Administered 2021-11-12 – 2021-11-13 (×3): 10 mL

## 2021-11-11 MED ORDER — HEPARIN SODIUM (PORCINE) 5000 UNIT/ML IJ SOLN: 5000.0000 [IU] | Freq: Three times a day (TID) | INTRAMUSCULAR | Status: DC

## 2021-11-11 MED ORDER — SODIUM CHLORIDE 0.9% FLUSH
5.0000 mL | Freq: Three times a day (TID) | INTRAVENOUS | Status: DC
  Administered 2021-11-11 – 2021-11-13 (×6): 5 mL

## 2021-11-11 MED ORDER — MIDAZOLAM HCL 2 MG/2ML IJ SOLN
INTRAMUSCULAR | Status: DC | PRN
  Administered 2021-11-11: .5 mg via INTRAVENOUS
  Administered 2021-11-11: 1 mg via INTRAVENOUS
  Administered 2021-11-11 (×2): .5 mg via INTRAVENOUS

## 2021-11-11 MED ORDER — LIDOCAINE HCL 1 % IJ SOLN
INTRAMUSCULAR | Status: AC
  Filled 2021-11-11: qty 20

## 2021-11-11 MED ORDER — POLYETHYLENE GLYCOL 3350 17 G PO PACK
17.0000 g | PACK | Freq: Two times a day (BID) | ORAL | Status: DC
  Administered 2021-11-11 – 2021-11-12 (×4): 17 g via ORAL
  Filled 2021-11-11 (×5): qty 1

## 2021-11-11 MED ORDER — IOHEXOL 300 MG/ML  SOLN: 50.0000 mL | Freq: Once | INTRAMUSCULAR | Status: DC | PRN

## 2021-11-11 MED ORDER — FENTANYL CITRATE (PF) 100 MCG/2ML IJ SOLN
INTRAMUSCULAR | Status: DC | PRN
  Administered 2021-11-11 (×2): 25 ug via INTRAVENOUS
  Administered 2021-11-11: 50 ug via INTRAVENOUS
  Administered 2021-11-11: 25 ug via INTRAVENOUS

## 2021-11-11 SURGICAL SUPPLY — 11 items
BAG URO CATCHER STRL LF (MISCELLANEOUS) ×2 IMPLANT
CATH URETL OPEN END 6FR 70 (CATHETERS) ×2 IMPLANT
CLOTH BEACON ORANGE TIMEOUT ST (SAFETY) ×2 IMPLANT
GLOVE SURG ENC MOIS LTX SZ6.5 (GLOVE) ×2 IMPLANT
GOWN STRL REUS W/ TWL LRG LVL3 (GOWN DISPOSABLE) ×1 IMPLANT
GOWN STRL REUS W/TWL LRG LVL3 (GOWN DISPOSABLE) ×6 IMPLANT
GUIDEWIRE STR DUAL SENSOR (WIRE) ×2 IMPLANT
MANIFOLD NEPTUNE II (INSTRUMENTS) ×2 IMPLANT
PACK CYSTO (CUSTOM PROCEDURE TRAY) ×2 IMPLANT
TUBING CONNECTING 10 (TUBING) ×2 IMPLANT
TUBING UROLOGY SET (TUBING) IMPLANT

## 2021-11-11 NOTE — Procedures (Signed)
Interventional Radiology Procedure Note  Procedure: Bilateral nephrostomy drain placement  Indication: Bilateral hydronephrosis  Findings: Please refer to procedural dictation for full description.  Complications: None  EBL: < 10 mL  Miachel Roux, MD [phone]

## 2021-11-11 NOTE — Progress Notes (Signed)
Chief Complaint: Patient was seen in consultation today for bilateral nephrostomy tubes   Referring Physician(s): Dr. Jacalyn Lefevre  Supervising Physician: Mir, Sharen Heck  Patient Status: [facility] - In-pt  History of Present Illness: Kevin Villegas is a 80 y.o. male with chronic renal failure, right renal atrophy with right distal obstruction, new onset left hydro with left mid ureteral stricture or obstruction status post left stent 10/26/2021. Urology has seen and discussed with pt and agreed to have bilateral PCN placed for urinary diversion. PMHx, meds, labs, imaging, allergies reviewed. Feels okay, no recent fevers, chills, illness. Has been NPO today as directed.   Past Medical History:  Diagnosis Date   Colostomy present [facility])    Large bowel obstruction ([place])    Pancreatic cancer [facility])     Past Surgical History:  Procedure Laterality Date   ABDOMINAL SURGERY     BIOPSY  05/20/2021   Procedure: BIOPSY;  Surgeon: Otis Brace, MD;  Location: [facility];  Service: Gastroenterology;;   BOWEL RESECTION     BOWEL RESECTION     CHOLECYSTECTOMY N/A 12/16/2014   Procedure: LAPAROSCOPIC CHOLECYSTECTOMY WITH INTRAOPERATIVE CHOLANGIOGRAM;  Surgeon: Georganna Skeans, MD;  Location: [facility];  Service: General;  Laterality: N/A;   COLONIC STENT PLACEMENT N/A 05/26/2021   Procedure: COLONIC STENT PLACEMENT;  Surgeon: Clarene Essex, MD;  Location: [facility];  Service: Endoscopy;  Laterality: N/A;   CYSTOSCOPY W/ URETERAL STENT PLACEMENT Left 10/27/2021   Procedure: CYSTOSCOPY WITH RETROGRADE PYELOGRAM/URETERAL STENT PLACEMENT;  Surgeon: Raynelle Bring, MD;  Location: [facility];  Service: Urology;  Laterality: Left;   ESOPHAGOGASTRODUODENOSCOPY N/A 05/20/2021   Procedure: ESOPHAGOGASTRODUODENOSCOPY (EGD);  Surgeon: Otis Brace, MD;  Location: [facility];  Service: Gastroenterology;  Laterality: N/A;   EUS N/A 09/24/2015   Procedure: UPPER ENDOSCOPIC ULTRASOUND (EUS) LINEAR;   Surgeon: Milus Banister, MD;  Location: [facility];  Service: Endoscopy;  Laterality: N/A;   FLEXIBLE SIGMOIDOSCOPY N/A 05/20/2021   Procedure: FLEXIBLE SIGMOIDOSCOPY;  Surgeon: Otis Brace, MD;  Location: [facility];  Service: Gastroenterology;  Laterality: N/A;   FLEXIBLE SIGMOIDOSCOPY N/A 05/26/2021   Procedure: FLEXIBLE SIGMOIDOSCOPY;  Surgeon: Clarene Essex, MD;  Location: [facility];  Service: Endoscopy;  Laterality: N/A;   LAPAROSCOPIC DISTAL PANCREATECTOMY  10/2017   [facility]   LAPAROSCOPIC PARTIAL GASTRECTOMY  10/2017   [facility]   LAPAROSCOPIC SIGMOID COLECTOMY N/A 06/14/2021   Procedure: LAPAROSCOPY WITH MOBILIZATION, PROXIMAL DIVERTING COLOSTOMY, CREATION OF DISTAL MUCOUS FISTULA;  Surgeon: Johnathan Hausen, MD;  Location: [facility];  Service: General;  Laterality: N/A;   LAPAROSCOPIC SPLENECTOMY  10/2017   [facility]   VASECTOMY      Allergies: Pollen extract  Medications:  Current Facility-Administered Medications:    acetaminophen (TYLENOL) tablet 650 mg, 650 mg, Oral, Q6H PRN **OR** acetaminophen (TYLENOL) suppository 650 mg, 650 mg, Rectal, Q6H PRN, Chotiner, Yevonne Aline, MD   amLODipine (NORVASC) tablet 5 mg, 5 mg, Oral, Daily, Chotiner, Yevonne Aline, MD, 5 mg at 11/10/21 0426   cefTRIAXone (ROCEPHIN) 1 g in sodium chloride 0.9 % 100 mL IVPB, 1 g, Intravenous, Q24H, Chotiner, Yevonne Aline, MD, Stopped at 11/11/21 0140   Chlorhexidine Gluconate Cloth 2 % PADS 6 each, 6 each, Topical, Daily, Mariel Aloe, MD, 6 each at 11/11/21 0911   heparin injection 5,000 Units, 5,000 Units, Subcutaneous, Q8H, Chotiner, Yevonne Aline, MD   HYDROmorphone (DILAUDID) injection 1 mg, 1 mg, Intravenous, Q3H PRN, Chotiner, Yevonne Aline, MD   ondansetron (ZOFRAN) tablet 4 mg, 4 mg, Oral, Q6H PRN **OR**  ondansetron (ZOFRAN) injection 4 mg, 4 mg, Intravenous, Q6H PRN, Chotiner, Yevonne Aline, MD, 4 mg at 11/10/21 1220   oxyCODONE (Oxy IR/ROXICODONE) immediate release tablet 5 mg, 5 mg, Oral, Q4H PRN, Mariel Aloe,  MD   polyethylene glycol (MIRALAX / GLYCOLAX) packet 17 g, 17 g, Oral, BID, Mariel Aloe, MD   tamsulosin (FLOMAX) capsule 0.4 mg, 0.4 mg, Oral, QPC supper, Festus Aloe, MD, 0.4 mg at 11/10/21 1715    Family History  Problem Relation Age of Onset   Stroke Mother     Social History   Socioeconomic History   Marital status: Married    Spouse name: Not on file   Number of children: 2   Years of education: Not on file   Highest education level: Not on file  Occupational History   Occupation: retired  Tobacco Use   Smoking status: Never   Smokeless tobacco: Never  Vaping Use   Vaping Use: Never used  Substance and Sexual Activity   Alcohol use: No    Alcohol/week: 0.0 standard drinks   Drug use: No   Sexual activity: Yes  Other Topics Concern   Not on file  Social History Narrative   Not on file   Social Determinants of Health   Financial Resource Strain: Not on file  Food Insecurity: Not on file  Transportation Needs: Not on file  Physical Activity: Not on file  Stress: Not on file  Social Connections: Not on file    Review of Systems: A 12 point ROS discussed and pertinent positives are indicated in the HPI above.  All other systems are negative.  Review of Systems  Vital Signs: BP 133/74   Pulse 73   Temp (!) 97.5 F (36.4 C) (Oral)   Resp 19   Ht 5\' 8"  (1.727 m)   Wt 63.5 kg   SpO2 95%   BMI 21.29 kg/m   Physical Exam Constitutional:      Appearance: Normal appearance.  HENT:     Mouth/Throat:     Mouth: Mucous membranes are moist.     Pharynx: Oropharynx is clear.  Cardiovascular:     Rate and Rhythm: Normal rate and regular rhythm.     Heart sounds: Normal heart sounds.  Pulmonary:     Effort: Pulmonary effort is normal. No respiratory distress.     Breath sounds: Normal breath sounds.  Skin:    General: Skin is warm and dry.  Neurological:     General: No focal deficit present.     Mental Status: He is alert and oriented to  person, place, and time.  Psychiatric:        Mood and Affect: Mood normal.        Thought Content: Thought content normal.        Judgment: Judgment normal.     Imaging: US Renal  Result Date: 10/27/2021 CLINICAL DATA:  Flank pain kidney stone. EXAM: RENAL / URINARY TRACT ULTRASOUND COMPLETE COMPARISON:  None. FINDINGS: Right Kidney: Renal measurements: 10.4 x 5.5 x 5.1 cm = volume: 0.2 mL. Echogenicity within normal limits. There is a 2.8 cm exophytic simple appearing cystic lesion. No solid mass. Moderate hydronephrosis visualized. Left Kidney: Renal measurements: 11.9 x 6.4 x 6.1 cm = volume: 241 mL. Echogenicity within normal limits. No solid mass. At least mild hydronephrosis visualized. Urinary bladder: Decompressed and not well visualized. Other: None. IMPRESSION: 1. Moderate right and mild left hydronephrosis. 2. Decompressed and not well visualized urinary bladder. Electronically  Signed   By: Iven Finn M.D.   On: 10/27/2021 17:31   DG Abd 2 Views  Result Date: 10/15/2021 CLINICAL DATA:  Soft tissue mass in right lower abdomen, superficial. EXAM: ABDOMEN - 2 VIEW COMPARISON:  Abdominopelvic CT 07/13/2021 FINDINGS: No free intra-abdominal air. No bowel dilatation to suggest obstruction. Multiple chain sutures throughout the abdomen and pelvis. Stent in the left upper quadrant is unchanged in positioning, the duodenum on prior CT. Left-sided ostomy is not well evaluated by radiograph. Formed stool in the colon. No bowel dilatation or evidence of obstruction. Cholecystectomy clips in the right upper quadrant. The suprapubic catheter in prior CT is not confidently identified. No radiographic findings of superficial soft tissue mass, although limited radiographic assessment of the soft tissues on provided views. No acute osseous abnormalities are seen. IMPRESSION: 1. Normal bowel gas pattern. No free air. 2. Unchanged appearance of stent in the left upper quadrant, in the duodenum on prior  CT. 3. Superficial soft tissues are not well assessed by radiograph, consider ultrasound or CT for further assessment based on clinical concern. Electronically Signed   By: Keith Rake M.D.   On: 10/15/2021 21:12   DG C-Arm 1-60 Min-No Report  Result Date: 10/27/2021 Fluoroscopy was utilized by the requesting physician.  No radiographic interpretation.   CT Renal Stone Study  Result Date: 11/09/2021 CLINICAL DATA:  Flank pain, kidney stone suspected recent stent in left kidney. need to evaluate placement EXAM: CT ABDOMEN AND PELVIS WITHOUT CONTRAST TECHNIQUE: Multidetector CT imaging of the abdomen and pelvis was performed following the standard protocol without IV contrast. COMPARISON:  CT 10/26/2021.  Renal ultrasound 10/27/2021 FINDINGS: Lower chest: Multiple bilateral basilar pulmonary nodules. Cavitary nodule in the left lower lobe. Many of these nodules were not previously included in the field of view. There is increased size of a right lower lobe nodule currently measuring 12 by 6 mm, series 3, image 4, previously 11 x 5 mm. Small left pleural effusion is similar. Hepatobiliary: Stable fluid density lesions in the liver consistent with cysts. Some of the smaller low-density lesions are too small to characterize. No new liver lesion. Cholecystectomy. No biliary dilatation. Pancreas: Soft tissue density in the region of the pancreatic body adjacent to duodenal stent measures approximately 3 x 3.8 cm using same measurement technique, previously 4.1 x 3.5 cm. The remainder the pancreas is difficult to delineate. Spleen: Surgically absent. Adrenals/Urinary Tract: No adrenal nodule. Left nephroureteral stent in place with proximal pigtail in the renal pelvis and distal pigtail in the bladder. Despite appropriate stent placement there is persistent moderate left hydroureteronephrosis. Left perinephric edema has slightly increased. Small focus of air in the left renal collecting system is likely  related to stent placement. No stone or stone fragments along the course of the stent or in the bladder. Urinary bladder is partially distended. Chronic moderate right hydronephrosis with renal parenchymal atrophy. Unchanged right renal cyst. Stable 6 mm stone in the distal right ureter, series 2, image 60. Stomach/Bowel: Bowel assessment is limited in the absence of enteric contrast and paucity of intra-abdominal fat. Postsurgical change of the stomach. Duodenal stent is unchanged in position. Proximal small bowel is nondilated. Mid small bowel is prominent with fecalization of small bowel contents. More distal small bowel is decompressed. There is no mesenteric inflammation or swirling of mesenteric vasculature. Multifocal colonic diverticulosis. No acute diverticulitis. Moderate colonic stool burden. Descending colostomy without peristomal hernia. Stapled off sigmoid colon in the pelvis. Vascular/Lymphatic: Mild aortic atherosclerosis. No  aortic aneurysm. Limited assessment for adenopathy. No definite bulky enlarged abdominopelvic lymph nodes. Reproductive: Prominent prostate.  Left inguinal testis. Other: Trace free fluid in the pelvis. No free air. Postsurgical change of the abdominal wall. Tiny fat containing umbilical hernia. Musculoskeletal: Degenerative change throughout the spine and both hips. No acute osseous findings. IMPRESSION: 1. Left nephroureteral stent in place with persistent moderate left hydroureteronephrosis. Left perinephric edema has slightly increased. Small focus of air in the left renal collecting system is likely related to stent placement. No stone or stone fragments along the course of the stent or in the bladder. 2. Chronic moderate right hydronephrosis with renal parenchymal atrophy. Stable 6 mm stone in the distal right ureter. 3. Soft tissue density in the region of the pancreatic body adjacent to duodenal stent is unchanged in size from prior CT. 4. Multiple bilateral basilar  pulmonary nodules consistent with pulmonary metastasis. Small left pleural effusion is similar. 5. Colonic diverticulosis without diverticulitis. 6. Fecalization of small bowel contents with slight increased diameter of distal small bowel suggests slow transit. No bowel obstruction. Aortic Atherosclerosis (ICD10-I70.0). Electronically Signed   By: Keith Rake M.D.   On: 11/09/2021 22:33    Labs:  CBC: Recent Labs    10/27/21 1647 10/28/21 0351 11/09/21 2243 11/10/21 0435  WBC 9.2 10.7* 17.1* 19.5*  HGB 12.6* 13.5 12.9* 13.4  HCT 38.1* 40.5 39.8 42.1  PLT 529* 591* 439* 514*    COAGS: Recent Labs    06/02/21 0407 06/11/21 1907 11/11/21 1215  INR 1.1 1.3* 1.2  APTT  --  26  --     BMP: Recent Labs    10/30/21 0450 11/09/21 2243 11/10/21 0435 11/11/21 1040  NA 138 138 136 134*  K 5.3* 4.9 4.9 5.9*  CL 107 106 105 103  CO2 22 23 22  21*  GLUCOSE 102* 93 113* 100*  BUN 56* 34* 40* 63*  CALCIUM 9.4 8.6* 8.6* 8.0*  CREATININE 2.44* 2.39* 3.17* 6.24*  GFRNONAA 26* 27* 19* 9*    LIVER FUNCTION TESTS: Recent Labs    07/05/21 1201 09/10/21 1442 10/13/21 1530 10/27/21 1647  BILITOT 0.3 0.3 <0.2 0.3  AST 45* 23 25 13*  ALT 46* 13 19 16   ALKPHOS 69 58 74 50  PROT 7.5 7.5 7.4 7.0  ALBUMIN 3.7 4.0 4.3 3.1*    TUMOR MARKERS: No results for input(s): AFPTM, CEA, CA199, CHROMGRNA in the last 8760 hours.  Assessment and Plan: Chronic renal failure, right renal atrophy with right distal obstruction, new onset left hydro with left mid ureteral stricture or obstruction status post left stent 10/26/2021. Plan for bilateral PCN placement Labs reviewed. Risks and benefits of bilateral PCN placement was discussed with the patient including, but not limited to, infection, bleeding, significant bleeding causing loss or decrease in renal function or damage to adjacent structures.   All of the patient's questions were answered, patient is agreeable to proceed.  Consent  signed and in chart.   Thank you for this interesting consult.  I greatly enjoyed meeting Kevin Villegas and look forward to participating in their care.  A copy of this report was sent to the requesting provider on this date.  Electronically Signed: Ascencion Dike, PA-C 11/11/2021, 12:44 PM   I spent a total of 25 minutes in face to face in clinical consultation, greater than 50% of which was counseling/coordinating care for Bilateral PCN

## 2021-11-11 NOTE — Assessment & Plan Note (Addendum)
Likely secondary to AKI. Lokelma given. Stabilized.

## 2021-11-11 NOTE — Progress Notes (Signed)
Pharmacy Brief Note: Post-IR Procedure Consult:  Pharmacy consulted to review inpatient med list and re-time anticoagulant or antiplatelet medications s/p IR procedure.   Procedure: Bilateral nephrostomy drain placement Anticoagulant orders: Prophylactic heparin 5000 units subQ q8h Bleed risk: Standard Time to resume per protocol: D#0 (minimum of 6 hours or next standard dosing interval)   Restart heparin 5000 units subQ q8h @ 2200 this evening  Pharmacy to sign off.   Lenis Noon, PharmD 11/11/21 3:13 PM

## 2021-11-11 NOTE — Progress Notes (Signed)
Urology Inpatient Progress Report     Intv/Subj: Patient left flank pain resolved after foley placed.  He reports voiding 200-300cc prior to coming to hospital and then urination abruptly stopped.   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   Right ureteral stone   Acute kidney injury superimposed on chronic kidney disease ([place])   Hydronephrosis of left kidney   Essential hypertension   Colostomy in place [facility])   Stage 3a chronic kidney disease (CKD) ([place])  Current Facility-Administered Medications  Medication Dose Route Frequency Provider Last Rate Last Admin   acetaminophen (TYLENOL) tablet 650 mg  650 mg Oral Q6H PRN Chotiner, Yevonne Aline, MD       Or   acetaminophen (TYLENOL) suppository 650 mg  650 mg Rectal Q6H PRN Chotiner, Yevonne Aline, MD       amLODipine (NORVASC) tablet 5 mg  5 mg Oral Daily Chotiner, Yevonne Aline, MD   5 mg at 11/10/21 0426   cefTRIAXone (ROCEPHIN) 1 g in sodium chloride 0.9 % 100 mL IVPB  1 g Intravenous Q24H Chotiner, Yevonne Aline, MD 200 mL/hr at 11/11/21 0033 1 g at 11/11/21 0033   Chlorhexidine Gluconate Cloth 2 % PADS 6 each  6 each Topical Daily Mariel Aloe, MD   6 each at 11/10/21 1715   heparin injection 5,000 Units  5,000 Units Subcutaneous Q8H Chotiner, Yevonne Aline, MD       HYDROmorphone (DILAUDID) injection 1 mg  1 mg Intravenous Q3H PRN Chotiner, Yevonne Aline, MD       ondansetron (ZOFRAN) tablet 4 mg  4 mg Oral Q6H PRN Chotiner, Yevonne Aline, MD       Or   ondansetron (ZOFRAN) injection 4 mg  4 mg Intravenous Q6H PRN Chotiner, Yevonne Aline, MD   4 mg at 11/10/21 1220   oxyCODONE (Oxy IR/ROXICODONE) immediate release tablet 5 mg  5 mg Oral Q4H PRN Mariel Aloe, MD       polyethylene glycol (MIRALAX / GLYCOLAX) packet 17 g  17 g Oral Daily PRN Chotiner, Yevonne Aline, MD   17 g at 11/10/21 1307   tamsulosin (FLOMAX) capsule 0.4 mg  0.4 mg Oral QPC supper Festus Aloe, MD   0.4 mg at 11/10/21 1715     Objective: Vital: Vitals:   11/10/21 0910 11/10/21 1421 11/10/21 2030 11/11/21 0548  BP: (!) 162/102 (!) 177/95 123/65 133/74  Pulse: 67 71 69 73  Resp: 17 18 17 19   Temp: 98 F (36.7 C) 98 F (36.7 C) 97.9 F (36.6 C) (!) 97.5 F (36.4 C)  TempSrc: Oral Oral Oral Oral  SpO2: 96% 94% 90% 95%  Weight:      Height:       I/Os: I/O last 3 completed shifts: In: 1434.3 [I.V.:1234.8; IV Piggyback:199.6] Out: 95 [Urine:95]  Physical Exam:  General: Patient is in no apparent distress Lungs: Normal respiratory effort, chest expands symmetrically. GI: The abdomen is soft and nontender without mass. GU: no left flank ttp Foley: clear yellow urine  Ext: lower extremities symmetric  Lab Results: Recent Labs    11/09/21 2243 11/10/21 0435  WBC 17.1* 19.5*  HGB 12.9* 13.4  HCT 39.8 42.1   Recent Labs    11/09/21 2243 11/10/21 0435  NA 138 136  K 4.9 4.9  CL 106 105  CO2 23 22  GLUCOSE 93 113*  BUN 34* 40*  CREATININE 2.39* 3.17*  CALCIUM 8.6* 8.6*   No results for input(s): LABPT, INR in the last 72 hours. No results for input(s): LABURIN in the last 72 hours. Results for orders placed or performed during the hospital encounter of 11/09/21  Resp Panel by RT-PCR (Flu A&B, Covid) Nasopharyngeal Swab     Status: None   Collection Time: 11/10/21  1:10 AM   Specimen: Nasopharyngeal Swab; Nasopharyngeal(NP) swabs in vial transport medium  Result Value Ref Range Status   SARS Coronavirus 2 by RT PCR NEGATIVE NEGATIVE Final    Comment: (NOTE) SARS-CoV-2 target nucleic acids are NOT DETECTED.  The SARS-CoV-2 RNA is generally detectable in upper respiratory specimens during the acute phase of infection. The lowest concentration of SARS-CoV-2 viral copies this assay can detect is 138 copies/mL. A negative result does not preclude SARS-Cov-2 infection and should not be used as the sole basis for treatment or other patient  management decisions. A negative result may occur with  improper specimen collection/handling, submission of specimen other than nasopharyngeal swab, presence of viral mutation(s) within the areas targeted by this assay, and inadequate number of viral copies(<138 copies/mL). A negative result must be combined with clinical observations, patient history, and epidemiological information. The expected result is Negative.  Fact Sheet for Patients:  EntrepreneurPulse.com.au  Fact Sheet for Healthcare Providers:  IncredibleEmployment.be  This test is no t yet approved or cleared by the [place] FDA and  has been authorized for detection and/or diagnosis of SARS-CoV-2 by FDA under an Emergency Use Authorization (EUA). This EUA will remain  in effect (meaning this test can be used) for the duration of the COVID-19 declaration under Section 564(b)(1) of the Act, 21 U.S.C.section 360bbb-3(b)(1), unless the authorization is terminated  or revoked sooner.       Influenza A by PCR NEGATIVE NEGATIVE Final   Influenza B by PCR NEGATIVE NEGATIVE Final    Comment: (NOTE) The Xpert Xpress SARS-CoV-2/FLU/RSV plus assay is intended as an aid in the diagnosis of influenza from Nasopharyngeal swab specimens and should not be used as a sole basis for treatment. Nasal washings and aspirates are unacceptable for Xpert Xpress SARS-CoV-2/FLU/RSV testing.  Fact Sheet for Patients: EntrepreneurPulse.com.au  Fact Sheet for Healthcare Providers: IncredibleEmployment.be  This test is not yet approved or cleared by the [place] FDA and has been authorized for detection and/or diagnosis of SARS-CoV-2 by FDA under an Emergency Use Authorization (EUA). This EUA will remain in effect (meaning this test can be used) for the duration of the COVID-19 declaration under Section 564(b)(1) of the Act, 21 U.S.C. section 360bbb-3(b)(1),  unless the authorization is terminated or revoked.  Performed at [facility], [address] [place], [place] [address]   Culture, blood (routine x 2)     Status: None (Preliminary result)   Collection Time: 11/10/21  5:30 AM   Specimen: BLOOD  Result Value Ref Range Status   Specimen Description   Final    BLOOD RIGHT ARM Performed at [facility] [address] [place], [place] [address]    Special Requests   Final    BOTTLES DRAWN AEROBIC ONLY Blood Culture adequate volume Performed at [facility] [address] [place], [place] [address]    Culture   Final    NO GROWTH < 12 HOURS Performed at [facility] [address], [place], [place] [address]    Report Status PENDING  Incomplete    Studies/Results: CT Renal Stone Study  Result Date: 11/09/2021 CLINICAL DATA:  Flank pain, kidney stone suspected recent stent in left kidney. need to evaluate placement EXAM: CT ABDOMEN AND PELVIS WITHOUT CONTRAST TECHNIQUE: Multidetector CT imaging of the abdomen and pelvis was performed following the standard protocol without IV contrast. COMPARISON:  CT 10/26/2021.  Renal ultrasound 10/27/2021 FINDINGS: Lower chest: Multiple bilateral basilar pulmonary nodules. Cavitary nodule in the left lower lobe. Many of these nodules were not previously included in the field of view. There is increased size of a right lower lobe nodule currently measuring 12 by 6 mm, series 3, image 4, previously 11 x 5 mm. Small left pleural effusion is similar. Hepatobiliary: Stable fluid density lesions in the liver consistent with cysts. Some of the smaller low-density lesions are too small to characterize. No new liver lesion. Cholecystectomy. No biliary dilatation. Pancreas: Soft tissue density in the region of the pancreatic body adjacent to duodenal stent measures approximately 3 x 3.8 cm using same measurement technique, previously 4.1 x 3.5 cm. The  remainder the pancreas is difficult to delineate. Spleen: Surgically absent. Adrenals/Urinary Tract: No adrenal nodule. Left nephroureteral stent in place with proximal pigtail in the renal pelvis and distal pigtail in the bladder. Despite appropriate stent placement there is persistent moderate left hydroureteronephrosis. Left perinephric edema has slightly increased. Small focus of air in the left renal collecting system is likely related to stent placement. No stone or stone fragments along the course of the stent or in the bladder. Urinary bladder is partially distended. Chronic moderate right hydronephrosis with renal parenchymal atrophy. Unchanged right renal cyst. Stable 6 mm stone in the distal right ureter, series 2, image 60. Stomach/Bowel: Bowel assessment is limited in the absence of enteric contrast and paucity of intra-abdominal fat. Postsurgical change of the stomach. Duodenal stent is unchanged in position. Proximal small bowel is nondilated. Mid small bowel is prominent with fecalization of small bowel contents. More distal small bowel is decompressed. There is no mesenteric inflammation or swirling of mesenteric vasculature. Multifocal colonic diverticulosis. No acute diverticulitis. Moderate colonic stool burden. Descending colostomy without peristomal hernia. Stapled off sigmoid colon in the pelvis. Vascular/Lymphatic: Mild aortic atherosclerosis. No aortic aneurysm. Limited assessment for adenopathy. No definite bulky enlarged abdominopelvic lymph nodes. Reproductive: Prominent prostate.  Left inguinal testis. Other: Trace free fluid in the pelvis. No free air. Postsurgical change of the abdominal wall. Tiny fat containing umbilical hernia. Musculoskeletal: Degenerative change throughout the spine and both hips. No acute osseous findings. IMPRESSION: 1. Left nephroureteral stent in place with persistent moderate left hydroureteronephrosis. Left perinephric edema has slightly increased. Small  focus of air in the left renal collecting system is likely related to stent placement. No stone or stone fragments along the course of the stent or in the bladder. 2. Chronic moderate right hydronephrosis with renal parenchymal atrophy. Stable 6 mm stone in the distal right ureter. 3. Soft tissue density in the region of the pancreatic body adjacent to duodenal stent is unchanged in size from prior CT. 4. Multiple bilateral basilar pulmonary nodules consistent with pulmonary metastasis. Small left pleural effusion is similar. 5. Colonic diverticulosis without diverticulitis. 6. Fecalization of small bowel contents with slight increased diameter of distal small bowel suggests slow transit. No bowel obstruction. Aortic Atherosclerosis (ICD10-I70.0). Electronically Signed   By: Keith Rake M.D.   On: 11/09/2021 22:33    Assessment/Plan: Chronic renal failure, right renal atrophy with right distal obstruction, new onset left hydro with left mid ureteral stricture or obstruction status post left  stent 10/26/2021 -GFR has acutely worsened with Cr now >6 -discussed placement of bilateral PCN tubes as temporizing measure -IR called and discussed with Dr. Dwaine Gale -please continue NPO for now and anticipated procedure later this afternoon   Jacalyn Lefevre, MD Urology 11/11/2021, 7:04 AM

## 2021-11-11 NOTE — Sedation Documentation (Signed)
Patient transported to [facility]. Sites assessed bilaterally with Physicist, medical. Sites unremarkable. No drainage from dressing.

## 2021-11-11 NOTE — Assessment & Plan Note (Addendum)
Miralax

## 2021-11-11 NOTE — Progress Notes (Signed)
PROGRESS NOTE    Kevin Villegas  XLK:[id number] DOB: 08-02-1941 DOA: 11/09/2021 PCP: Dettinger, [name], MD   Brief Narrative: Kevin Villegas is a 80 y.o. male with a history of metastatic pancreatic cancer, SBO s/p diverting colostomy, CKD stage IIIa, chronic right nephrolithiasis with associated chronic right hydronephrosis and recent left hydronephrosis with AKI. Patient presented with left flank pain and decreased urine output and found to have evidence of persistent left sided hydronephrosis and left pyelonephritis. Empiric Ceftriaxone initiated. Blood and urine cultures obtained and are pending. Patient anuric; urology consulted.   Assessment & Plan:   * Acute pyelonephritis In setting of recent ureteral obstruction, s/p left ureteral stent placement. Empirically started on Ceftriaxone IV. Urine and blood cultures obtained on admission. Urine culture significant for staph epidermidis which I am not convinced is the culprit bacteria -Continue Ceftriaxone IV -Nephrostomy tubes pending; can obtain repeat urine culture from urine sample post-procedure  Acute kidney injury superimposed on chronic kidney disease ([place]) In setting of recent ureteral obstruction and is s/p ureteral stent. Persistent hydronephrosis. Currently oliguric. Urology consulted and recommended foley catheter. Worsening creatinine today. -Urology recommendations: bilateral nephrostomy tubes -Strict in/out -If worsening creatinine with tubes, will need nephrology consult  Right ureteral stone Chronic issue. Stable. Associated right renal atrophy.  Colostomy in place [facility]) Noted.  Essential hypertension Blood pressure uncontrolled. Started on amlodipine 5 mg daily on admission -Continue amlodipine; titrate up as needed  Hydronephrosis of left kidney Persistent s/p ureteral stent from prior admission. Increased creatinine. Urology consulted.  Constipation -Miralax BID  Stage 3a chronic kidney disease  (CKD) (HCC) Baseline creatinine of about 1.3-1.6  Hyperkalemia Likely secondary to AKI. Patient currently NPO. -Repeat potassium; if trending up, will give sodium bicarb IV fluids      DVT prophylaxis: Heparin subq Code Status:   Code Status: Full Code Family Communication: None at bedside Disposition Plan: Discharge home likely in several days pending continued treatment of pyelonephritis, urology recommendations, improvement of urine output/creatinine   Consultants:  Urology  Procedures:  Foley Catheter (11/23 >>  Antimicrobials: Ceftriaxone IV    Subjective: Flank pain improved. Some urine output. No bowel movement.  Objective: Vitals:   11/10/21 0910 11/10/21 1421 11/10/21 2030 11/11/21 0548  BP: (!) 162/102 (!) 177/95 123/65 133/74  Pulse: 67 71 69 73  Resp: 17 18 17 19   Temp: 98 F (36.7 C) 98 F (36.7 C) 97.9 F (36.6 C) (!) 97.5 F (36.4 C)  TempSrc: Oral Oral Oral Oral  SpO2: 96% 94% 90% 95%  Weight:      Height:        Intake/Output Summary (Last 24 hours) at 11/11/2021 1203 Last data filed at 11/11/2021 0920 Gross per 24 hour  Intake 1027.88 ml  Output 105 ml  Net 922.88 ml    Filed Weights   11/09/21 2142  Weight: 63.5 kg    Examination:  General exam: Appears calm and comfortable Respiratory system: Clear to auscultation. Respiratory effort normal. Cardiovascular system: S1 & S2 heard, RRR. No murmurs, rubs, gallops or clicks. Gastrointestinal system: Abdomen is nondistended, soft and nontender. No organomegaly or masses felt. Normal bowel sounds heard. Air on colostomy Central nervous system: Alert and oriented. No focal neurological deficits. Musculoskeletal: No edema. No calf tenderness Skin: No cyanosis. No rashes Psychiatry: Judgement and insight appear normal. Mood & affect appropriate.     Data Reviewed: I have personally reviewed following labs and imaging studies  CBC Lab Results  Component Value Date  WBC 19.5 (H)  11/10/2021   RBC 4.48 11/10/2021   HGB 13.4 11/10/2021   HCT 42.1 11/10/2021   MCV 94.0 11/10/2021   MCH 29.9 11/10/2021   PLT 514 (H) 11/10/2021   MCHC 31.8 11/10/2021   RDW 13.8 11/10/2021   LYMPHSABS 2.0 11/09/2021   MONOABS 1.9 (H) 11/09/2021   EOSABS 0.3 11/09/2021   BASOSABS 0.1 62/02/5596     Last metabolic panel Lab Results  Component Value Date   NA 134 (L) 11/11/2021   K 5.9 (H) 11/11/2021   CL 103 11/11/2021   CO2 21 (L) 11/11/2021   BUN 63 (H) 11/11/2021   CREATININE 6.24 (H) 11/11/2021   GLUCOSE 100 (H) 11/11/2021   GFRNONAA 9 (L) 11/11/2021   GFRAA >60 11/24/2019   CALCIUM 8.0 (L) 11/11/2021   PHOS 3.1 06/16/2021   PROT 7.0 10/27/2021   ALBUMIN 3.1 (L) 10/27/2021   LABGLOB 3.1 10/13/2021   AGRATIO 1.4 10/13/2021   BILITOT 0.3 10/27/2021   ALKPHOS 50 10/27/2021   AST 13 (L) 10/27/2021   ALT 16 10/27/2021   ANIONGAP 10 11/11/2021    CBG (last 3)  No results for input(s): GLUCAP in the last 72 hours.   GFR: Estimated Creatinine Clearance: 8.6 mL/min (A) (by C-G formula based on SCr of 6.24 mg/dL (H)).  Coagulation Profile: No results for input(s): INR, PROTIME in the last 168 hours.  Recent Results (from the past 240 hour(s))  Urine Culture     Status: Abnormal   Collection Time: 11/10/21 12:14 AM   Specimen: In/Out Cath Urine  Result Value Ref Range Status   Specimen Description   Final    IN/OUT CATH URINE Performed at [facility], [address] [place], [place] [address]    Special Requests   Final    NONE Performed at [facility], [address] [place], [place] [address]    Culture 10,000 COLONIES/mL STAPHYLOCOCCUS EPIDERMIDIS (A)  Final   Report Status 11/11/2021 FINAL  Final  Resp Panel by RT-PCR (Flu A&B, Covid) Nasopharyngeal Swab     Status: None   Collection Time: 11/10/21  1:10 AM   Specimen: Nasopharyngeal Swab; Nasopharyngeal(NP) swabs in vial transport medium  Result Value Ref Range Status   SARS  Coronavirus 2 by RT PCR NEGATIVE NEGATIVE Final    Comment: (NOTE) SARS-CoV-2 target nucleic acids are NOT DETECTED.  The SARS-CoV-2 RNA is generally detectable in upper respiratory specimens during the acute phase of infection. The lowest concentration of SARS-CoV-2 viral copies this assay can detect is 138 copies/mL. A negative result does not preclude SARS-Cov-2 infection and should not be used as the sole basis for treatment or other patient management decisions. A negative result may occur with  improper specimen collection/handling, submission of specimen other than nasopharyngeal swab, presence of viral mutation(s) within the areas targeted by this assay, and inadequate number of viral copies(<138 copies/mL). A negative result must be combined with clinical observations, patient history, and epidemiological information. The expected result is Negative.  Fact Sheet for Patients:  EntrepreneurPulse.com.au  Fact Sheet for Healthcare Providers:  IncredibleEmployment.be  This test is no t yet approved or cleared by the [place] FDA and  has been authorized for detection and/or diagnosis of SARS-CoV-2 by FDA under an Emergency Use Authorization (EUA). This EUA will remain  in effect (meaning this test can be used) for the duration of the COVID-19 declaration under Section 564(b)(1) of the Act, 21 U.S.C.section 360bbb-3(b)(1),  unless the authorization is terminated  or revoked sooner.       Influenza A by PCR NEGATIVE NEGATIVE Final   Influenza B by PCR NEGATIVE NEGATIVE Final    Comment: (NOTE) The Xpert Xpress SARS-CoV-2/FLU/RSV plus assay is intended as an aid in the diagnosis of influenza from Nasopharyngeal swab specimens and should not be used as a sole basis for treatment. Nasal washings and aspirates are unacceptable for Xpert Xpress SARS-CoV-2/FLU/RSV testing.  Fact Sheet for  Patients: EntrepreneurPulse.com.au  Fact Sheet for Healthcare Providers: IncredibleEmployment.be  This test is not yet approved or cleared by the [place] FDA and has been authorized for detection and/or diagnosis of SARS-CoV-2 by FDA under an Emergency Use Authorization (EUA). This EUA will remain in effect (meaning this test can be used) for the duration of the COVID-19 declaration under Section 564(b)(1) of the Act, 21 U.S.C. section 360bbb-3(b)(1), unless the authorization is terminated or revoked.  Performed at [facility], [address] [place], [place] [address]   Culture, blood (routine x 2)     Status: None (Preliminary result)   Collection Time: 11/10/21  5:30 AM   Specimen: BLOOD  Result Value Ref Range Status   Specimen Description   Final    BLOOD RIGHT ARM Performed at [facility] [address] [place], [place] [address]    Special Requests   Final    BOTTLES DRAWN AEROBIC ONLY Blood Culture adequate volume Performed at [facility] [address], [place], [place] [address]    Culture   Final    NO GROWTH < 12 HOURS Performed at [facility] [address] [place], [place] [address]    Report Status PENDING  Incomplete        Radiology Studies: CT Renal Stone Study  Result Date: 11/09/2021 CLINICAL DATA:  Flank pain, kidney stone suspected recent stent in left kidney. need to evaluate placement EXAM: CT ABDOMEN AND PELVIS WITHOUT CONTRAST TECHNIQUE: Multidetector CT imaging of the abdomen and pelvis was performed following the standard protocol without IV contrast. COMPARISON:  CT 10/26/2021.  Renal ultrasound 10/27/2021 FINDINGS: Lower chest: Multiple bilateral basilar pulmonary nodules. Cavitary nodule in the left lower lobe. Many of these nodules were not previously included in the field of view. There is increased size of a right lower lobe nodule  currently measuring 12 by 6 mm, series 3, image 4, previously 11 x 5 mm. Small left pleural effusion is similar. Hepatobiliary: Stable fluid density lesions in the liver consistent with cysts. Some of the smaller low-density lesions are too small to characterize. No new liver lesion. Cholecystectomy. No biliary dilatation. Pancreas: Soft tissue density in the region of the pancreatic body adjacent to duodenal stent measures approximately 3 x 3.8 cm using same measurement technique, previously 4.1 x 3.5 cm. The remainder the pancreas is difficult to delineate. Spleen: Surgically absent. Adrenals/Urinary Tract: No adrenal nodule. Left nephroureteral stent in place with proximal pigtail in the renal pelvis and distal pigtail in the bladder. Despite appropriate stent placement there is persistent moderate left hydroureteronephrosis. Left perinephric edema has slightly increased. Small focus of air in the left renal collecting system is likely related to stent placement. No stone or stone fragments along the course of the stent or in the bladder. Urinary bladder is partially distended. Chronic moderate right hydronephrosis with renal parenchymal atrophy. Unchanged right renal cyst. Stable 6 mm stone in the distal right ureter, series 2, image 60.  Stomach/Bowel: Bowel assessment is limited in the absence of enteric contrast and paucity of intra-abdominal fat. Postsurgical change of the stomach. Duodenal stent is unchanged in position. Proximal small bowel is nondilated. Mid small bowel is prominent with fecalization of small bowel contents. More distal small bowel is decompressed. There is no mesenteric inflammation or swirling of mesenteric vasculature. Multifocal colonic diverticulosis. No acute diverticulitis. Moderate colonic stool burden. Descending colostomy without peristomal hernia. Stapled off sigmoid colon in the pelvis. Vascular/Lymphatic: Mild aortic atherosclerosis. No aortic aneurysm. Limited assessment for  adenopathy. No definite bulky enlarged abdominopelvic lymph nodes. Reproductive: Prominent prostate.  Left inguinal testis. Other: Trace free fluid in the pelvis. No free air. Postsurgical change of the abdominal wall. Tiny fat containing umbilical hernia. Musculoskeletal: Degenerative change throughout the spine and both hips. No acute osseous findings. IMPRESSION: 1. Left nephroureteral stent in place with persistent moderate left hydroureteronephrosis. Left perinephric edema has slightly increased. Small focus of air in the left renal collecting system is likely related to stent placement. No stone or stone fragments along the course of the stent or in the bladder. 2. Chronic moderate right hydronephrosis with renal parenchymal atrophy. Stable 6 mm stone in the distal right ureter. 3. Soft tissue density in the region of the pancreatic body adjacent to duodenal stent is unchanged in size from prior CT. 4. Multiple bilateral basilar pulmonary nodules consistent with pulmonary metastasis. Small left pleural effusion is similar. 5. Colonic diverticulosis without diverticulitis. 6. Fecalization of small bowel contents with slight increased diameter of distal small bowel suggests slow transit. No bowel obstruction. Aortic Atherosclerosis (ICD10-I70.0). Electronically Signed   By: Keith Rake M.D.   On: 11/09/2021 22:33        Scheduled Meds:  amLODipine  5 mg Oral Daily   Chlorhexidine Gluconate Cloth  6 each Topical Daily   heparin  5,000 Units Subcutaneous Q8H   polyethylene glycol  17 g Oral BID   tamsulosin  0.4 mg Oral QPC supper   Continuous Infusions:  cefTRIAXone (ROCEPHIN)  IV Stopped (11/11/21 0140)     LOS: 0 days     Cordelia Poche, MD [facility] 11/11/2021, 12:03 PM  If 7PM-7AM, please contact night-coverage [email or web address]

## 2021-11-11 NOTE — Anesthesia Preprocedure Evaluation (Deleted)
Anesthesia Evaluation    Reviewed: Allergy & Precautions, Patient's Chart, lab work & pertinent test results  History of Anesthesia Complications Negative for: history of anesthetic complications  Airway        Dental   Pulmonary neg pulmonary ROS,           Cardiovascular hypertension,      Neuro/Psych negative neurological ROS  negative psych ROS   GI/Hepatic Neg liver ROS,  Pancreatic cancer Colostomy S/p partial gastrectomy, bowel resection, splenectomy    Endo/Other  negative endocrine ROS  Renal/GU CRF and ARFRenal disease     Musculoskeletal negative musculoskeletal ROS (+)   Abdominal   Peds  Hematology negative hematology ROS (+)   Anesthesia Other Findings   Reproductive/Obstetrics                             Anesthesia Physical Anesthesia Plan  ASA: 3  Anesthesia Plan: General   Post-op Pain Management: Tylenol PO (pre-op) and Minimal or no pain anticipated   Induction: Intravenous  PONV Risk Score and Plan: 2 and Treatment may vary due to age or medical condition, Ondansetron and Dexamethasone  Airway Management Planned: LMA  Additional Equipment: None  Intra-op Plan:   Post-operative Plan: Extubation in OR  Informed Consent:   Plan Discussed with: CRNA and Anesthesiologist  Anesthesia Plan Comments:         Anesthesia Quick Evaluation

## 2021-11-12 LAB — BASIC METABOLIC PANEL
Anion gap: 8 (ref 5–15)
BUN: 62 mg/dL — ABNORMAL HIGH (ref 8–23)
CO2: 23 mmol/L (ref 22–32)
Calcium: 8.4 mg/dL — ABNORMAL LOW (ref 8.9–10.3)
Chloride: 106 mmol/L (ref 98–111)
Creatinine, Ser: 4.67 mg/dL — ABNORMAL HIGH (ref 0.61–1.24)
GFR, Estimated: 12 mL/min — ABNORMAL LOW (ref >60)
Glucose, Bld: 109 mg/dL — ABNORMAL HIGH (ref 70–99)
Potassium: 5.5 mmol/L — ABNORMAL HIGH (ref 3.5–5.1)
Sodium: 137 mmol/L (ref 135–145)

## 2021-11-12 MED ORDER — CHLORHEXIDINE GLUCONATE CLOTH 2 % EX PADS
6.0000 | MEDICATED_PAD | Freq: Every day | CUTANEOUS | Status: DC
  Administered 2021-11-13: 6 via TOPICAL

## 2021-11-12 MED ORDER — SODIUM ZIRCONIUM CYCLOSILICATE 10 G PO PACK
10.0000 g | PACK | Freq: Every day | ORAL | Status: AC
  Administered 2021-11-12: 10 g via ORAL
  Filled 2021-11-12: qty 1

## 2021-11-12 NOTE — Progress Notes (Signed)
Urology Inpatient Progress Report     Intv/Subj: B/l PCN tubes placed yesterday by IR.  Cr improving this AM. Pt doing well.   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   Right ureteral stone   Acute kidney injury superimposed on chronic kidney disease (HCC)   Hydronephrosis of left kidney   Hyperkalemia   Essential hypertension   Complicated UTI (urinary tract infection)   Colostomy in place ([place])   Stage 3a chronic kidney disease (CKD) ([place])   Constipation  Current Facility-Administered Medications  Medication Dose Route Frequency Provider Last Rate Last Admin   acetaminophen (TYLENOL) tablet 650 mg  650 mg Oral Q6H PRN Chotiner, Yevonne Aline, MD   650 mg at 11/12/21 5621   Or   acetaminophen (TYLENOL) suppository 650 mg  650 mg Rectal Q6H PRN Chotiner, Yevonne Aline, MD       amLODipine (NORVASC) tablet 5 mg  5 mg Oral Daily Chotiner, Yevonne Aline, MD   5 mg at 11/10/21 0426   cefTRIAXone (ROCEPHIN) 1 g in sodium chloride 0.9 % 100 mL IVPB  1 g Intravenous Q24H Chotiner, Yevonne Aline, MD 200 mL/hr at 11/12/21 0029 1 g at 11/12/21 0029   Chlorhexidine Gluconate Cloth 2 % PADS 6 each  6 each Topical Daily Mariel Aloe, MD   6 each at 11/11/21 0911   heparin injection 5,000 Units  5,000 Units Subcutaneous Q8H Swayne, Mary M, RPH       HYDROmorphone (DILAUDID) injection 1 mg  1 mg Intravenous Q3H PRN Chotiner, Yevonne Aline, MD       iohexol (OMNIPAQUE) 300 MG/ML solution 50 mL  50 mL Per Tube Once PRN Mir, Paula Libra, MD       lidocaine (PF) (XYLOCAINE) 1 % injection 20 mL  20 mL Intradermal Once Mir, Paula Libra, MD       ondansetron [facility]) tablet 4 mg  4 mg Oral Q6H PRN Chotiner, Yevonne Aline, MD       Or   ondansetron (ZOFRAN) injection 4 mg  4 mg Intravenous Q6H PRN Chotiner, Yevonne Aline, MD   4 mg at 11/10/21 1220   oxyCODONE (Oxy IR/ROXICODONE) immediate release tablet 5 mg  5 mg Oral Q4H PRN Mariel Aloe, MD   5 mg at 11/11/21 1631   polyethylene glycol (MIRALAX / GLYCOLAX) packet 17 g  17 g Oral BID Mariel Aloe, MD   17 g at 11/11/21 2021   sodium chloride flush (NS) 0.9 % injection 10-40 mL  10-40 mL Intracatheter Q12H Mariel Aloe, MD       sodium chloride flush (NS) 0.9 % injection 10-40 mL  10-40 mL Intracatheter PRN Mariel Aloe, MD       sodium chloride flush (NS) 0.9 % injection 5 mL  5 mL Intracatheter Q8H Mir, Paula Libra, MD   5 mL at 11/11/21 2301   tamsulosin (FLOMAX) capsule 0.4 mg  0.4 mg Oral QPC supper Festus Aloe, MD   0.4 mg at 11/11/21 1747     Objective: Vital: Vitals:   11/11/21 1631 11/11/21 1716 11/11/21 2122 11/12/21 0608  BP:   132/84 130/82  Pulse:   71 70  Resp: 20 18 18 17   Temp:   98.4 F (36.9 C) 98.1 F (36.7 C)  TempSrc:   Oral Oral  SpO2:   95% 100%  Weight:      Height:  I/Os: I/O last 3 completed shifts: In: 1037.9 [I.V.:927.9; Other:10; IV Piggyback:100] Out: 8182 [Urine:2455]  Physical Exam:  General: Patient is in no apparent distress Lungs: Normal respiratory effort, chest expands symmetrically. GI:The abdomen is soft and nontender  Foley: no UOP  L PCN: clear yellow urine R PCN: pink tinged/red urine Ext: lower extremities symmetric  Lab Results: Recent Labs    11/09/21 2243 11/10/21 0435  WBC 17.1* 19.5*  HGB 12.9* 13.4  HCT 39.8 42.1   Recent Labs    11/11/21 1040 11/11/21 1315 11/11/21 1744 11/12/21 0457  NA 134*  --  136 137  K 5.9* 5.5* 5.2* 5.5*  CL 103  --  106 106  CO2 21*  --  19* 23  GLUCOSE 100*  --  155* 109*  BUN 63*  --  64* 62*  CREATININE 6.24*  --  6.09* 4.67*  CALCIUM 8.0*  --  8.1* 8.4*   Recent Labs    11/11/21 1215  INR 1.2   No results for input(s): LABURIN in the last 72 hours. Results for orders placed or performed during the hospital encounter of 11/09/21  Urine Culture     Status: Abnormal   Collection Time: 11/10/21 12:14 AM   Specimen: In/Out Cath  Urine  Result Value Ref Range Status   Specimen Description   Final    IN/OUT CATH URINE Performed at [facility], [address] [place], [place] [address]    Special Requests   Final    NONE Performed at [facility], [address] [place], [place] [address]    Culture 10,000 COLONIES/mL STAPHYLOCOCCUS EPIDERMIDIS (A)  Final   Report Status 11/11/2021 FINAL  Final  Resp Panel by RT-PCR (Flu A&B, Covid) Nasopharyngeal Swab     Status: None   Collection Time: 11/10/21  1:10 AM   Specimen: Nasopharyngeal Swab; Nasopharyngeal(NP) swabs in vial transport medium  Result Value Ref Range Status   SARS Coronavirus 2 by RT PCR NEGATIVE NEGATIVE Final    Comment: (NOTE) SARS-CoV-2 target nucleic acids are NOT DETECTED.  The SARS-CoV-2 RNA is generally detectable in upper respiratory specimens during the acute phase of infection. The lowest concentration of SARS-CoV-2 viral copies this assay can detect is 138 copies/mL. A negative result does not preclude SARS-Cov-2 infection and should not be used as the sole basis for treatment or other patient management decisions. A negative result may occur with  improper specimen collection/handling, submission of specimen other than nasopharyngeal swab, presence of viral mutation(s) within the areas targeted by this assay, and inadequate number of viral copies(<138 copies/mL). A negative result must be combined with clinical observations, patient history, and epidemiological information. The expected result is Negative.  Fact Sheet for Patients:  EntrepreneurPulse.com.au  Fact Sheet for Healthcare Providers:  IncredibleEmployment.be  This test is no t yet approved or cleared by the [place] FDA and  has been authorized for detection and/or diagnosis of SARS-CoV-2 by FDA under an Emergency Use Authorization (EUA). This EUA will remain  in effect (meaning this test can be used) for  the duration of the COVID-19 declaration under Section 564(b)(1) of the Act, 21 U.S.C.section 360bbb-3(b)(1), unless the authorization is terminated  or revoked sooner.       Influenza A by PCR NEGATIVE NEGATIVE Final   Influenza B by PCR NEGATIVE NEGATIVE Final    Comment: (NOTE) The Xpert Xpress SARS-CoV-2/FLU/RSV plus assay is intended as an aid in the diagnosis of influenza  from Nasopharyngeal swab specimens and should not be used as a sole basis for treatment. Nasal washings and aspirates are unacceptable for Xpert Xpress SARS-CoV-2/FLU/RSV testing.  Fact Sheet for Patients: EntrepreneurPulse.com.au  Fact Sheet for Healthcare Providers: IncredibleEmployment.be  This test is not yet approved or cleared by the [place] FDA and has been authorized for detection and/or diagnosis of SARS-CoV-2 by FDA under an Emergency Use Authorization (EUA). This EUA will remain in effect (meaning this test can be used) for the duration of the COVID-19 declaration under Section 564(b)(1) of the Act, 21 U.S.C. section 360bbb-3(b)(1), unless the authorization is terminated or revoked.  Performed at [facility], [address] [place], [place] [address]   Culture, blood (routine x 2)     Status: None (Preliminary result)   Collection Time: 11/10/21  5:30 AM   Specimen: BLOOD  Result Value Ref Range Status   Specimen Description   Final    BLOOD RIGHT ARM Performed at [facility] [address], [place], [place] [address]    Special Requests   Final    BOTTLES DRAWN AEROBIC ONLY Blood Culture adequate volume Performed at [facility] [address], [place], [place] [address]    Culture   Final    NO GROWTH 1 DAY Performed at [facility], [place] [address], [place], [place] [address]    Report Status PENDING  Incomplete    Studies/Results: IR NEPHROSTOMY PLACEMENT LEFT  Result Date:  11/11/2021 INDICATION: 80 year old gentleman with history of metastatic pancreatic malignancy developed bilateral hydronephrosis. Retrograde left ureteral stent was placed, however patient return with worsening renal function and bilateral hydronephrosis. IR consulted for bilateral nephrostomy drain placement. EXAM: Ultrasound-guided bilateral nephrostomy drain placement COMPARISON:  None. MEDICATIONS: Patient currently on inpatient regiment of IV Rocephin. ANESTHESIA/SEDATION: Fentanyl 125 mcg IV; Versed 2.5 mg IV Moderate Sedation Time:  20 minutes The patient was continuously monitored during the procedure by the interventional radiology nurse under my direct supervision. CONTRAST:  20 mL of Omnipaque 300-administered into the collecting system(s) FLUOROSCOPY TIME:  Fluoroscopy Time: 2 minutes 6 seconds (6 mGy). COMPLICATIONS: None immediate. PROCEDURE: Informed written consent was obtained from the patient after a thorough discussion of the procedural risks, benefits and alternatives. All questions were addressed. Maximal Sterile Barrier Technique was utilized including caps, mask, sterile gowns, sterile gloves, sterile drape, hand hygiene and skin antiseptic. A timeout was performed prior to the initiation of the procedure. Patient position prone on the procedure table. Bilateral flank skin prepped and draped usual fashion. Sterile ultrasound probe cover and gel utilized throughout the procedure. Following local lidocaine administration, 21 gauge needle inserted into the lower pole calyx of the left kidney using continuous ultrasound guidance. 21 gauge needle exchanged for transitional dilator set over 0.018 inch guidewire. Contrast administered through the present dilator set confirmed appropriate positioning within the renal collecting system. Transitional dilator set exchanged for 10 French multipurpose pigtail drain over 0.035 inch guidewire. Contrast administered through the drain under fluoroscopy  confirmed appropriate positioning within the renal collecting system. Following the IV administration of 21 gauge needle inserted into the midpole calyx of the right kidney using continuous ultrasound guidance. 20 gauge needle exchanged for a transitional dilator set over 0.018 inch guidewire. Positional dilator set exchanged for 10 [place] multipurpose pigtail drain over 0.035 inch guidewire. Contrast administered through the drain under fluoroscopy confirmed appropriate positioning within the renal collecting system. Both drains were secured to skin with suture  and connected to bag. IMPRESSION: Bilateral 10 French nephrostomy drain placement as above. Electronically Signed   By: Miachel Roux M.D.   On: 11/11/2021 15:03   IR NEPHROSTOMY PLACEMENT RIGHT  Result Date: 11/11/2021 INDICATION: 80 year old gentleman with history of metastatic pancreatic malignancy developed bilateral hydronephrosis. Retrograde left ureteral stent was placed, however patient return with worsening renal function and bilateral hydronephrosis. IR consulted for bilateral nephrostomy drain placement. EXAM: Ultrasound-guided bilateral nephrostomy drain placement COMPARISON:  None. MEDICATIONS: Patient currently on inpatient regiment of IV Rocephin. ANESTHESIA/SEDATION: Fentanyl 125 mcg IV; Versed 2.5 mg IV Moderate Sedation Time:  20 minutes The patient was continuously monitored during the procedure by the interventional radiology nurse under my direct supervision. CONTRAST:  20 mL of Omnipaque 300-administered into the collecting system(s) FLUOROSCOPY TIME:  Fluoroscopy Time: 2 minutes 6 seconds (6 mGy). COMPLICATIONS: None immediate. PROCEDURE: Informed written consent was obtained from the patient after a thorough discussion of the procedural risks, benefits and alternatives. All questions were addressed. Maximal Sterile Barrier Technique was utilized including caps, mask, sterile gowns, sterile gloves, sterile drape, hand hygiene and  skin antiseptic. A timeout was performed prior to the initiation of the procedure. Patient position prone on the procedure table. Bilateral flank skin prepped and draped usual fashion. Sterile ultrasound probe cover and gel utilized throughout the procedure. Following local lidocaine administration, 21 gauge needle inserted into the lower pole calyx of the left kidney using continuous ultrasound guidance. 21 gauge needle exchanged for transitional dilator set over 0.018 inch guidewire. Contrast administered through the present dilator set confirmed appropriate positioning within the renal collecting system. Transitional dilator set exchanged for 10 French multipurpose pigtail drain over 0.035 inch guidewire. Contrast administered through the drain under fluoroscopy confirmed appropriate positioning within the renal collecting system. Following the IV administration of 21 gauge needle inserted into the midpole calyx of the right kidney using continuous ultrasound guidance. 20 gauge needle exchanged for a transitional dilator set over 0.018 inch guidewire. Positional dilator set exchanged for 10 [place] multipurpose pigtail drain over 0.035 inch guidewire. Contrast administered through the drain under fluoroscopy confirmed appropriate positioning within the renal collecting system. Both drains were secured to skin with suture and connected to bag. IMPRESSION: Bilateral 10 French nephrostomy drain placement as above. Electronically Signed   By: Miachel Roux M.D.   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

## 2021-11-12 NOTE — Progress Notes (Signed)
Referring Physician(s): Pace,M  Supervising Physician: Michaelle Birks  Patient Status:  Kevin Villegas - In-pt  Chief Complaint:  Right flank pain, bilateral hydronephrosis, metastatic pancreatic cancer  Subjective: Pt c/o some mild rt flank discomfort; denies fever, chills, N/V ; asking when foley cath can come out   Allergies: Pollen extract  Medications: Prior to Admission medications   Medication Sig Start Date End Date Taking? Authorizing Provider  oxymetazoline (AFRIN) 0.05 % nasal spray Place 1 spray into both nostrils 2 (two) times daily as needed for congestion.   Yes [provider]  phenazopyridine (PYRIDIUM) 200 MG tablet Take 200 mg by mouth every 8 (eight) hours as needed for pain. 11/05/21  Yes [provider]  polyethylene glycol (MIRALAX / GLYCOLAX) 17 g packet Take 17 g by mouth daily. 10/31/21  Yes Mariel Aloe, MD  traMADol (ULTRAM) 50 MG tablet Take 50-100 mg by mouth every 6 (six) hours as needed for pain. 11/05/21  Yes [provider]     Vital Signs: BP 130/82 (BP Location: Right Arm)   Pulse 70   Temp 98.1 F (36.7 C) (Oral)   Resp 17   Ht _0  (1.727 m)   Wt 140 lb (63.5 kg)   SpO2 100%   BMI 21.29 kg/m   Physical Exam awake/alert; both rt and left PCN's intact, OP from left about 2.7 liters yellow urine since placed yesterday, OP from right about 125 cc blood-tinged fluid; both caths flushed without resistance; some soreness at rt PCN insertion site; minimal yellow urine in foley  Imaging: CT Renal Stone Study  Result Date: 11/09/2021 CLINICAL DATA:  Flank pain, kidney stone suspected recent stent in left kidney. need to evaluate placement EXAM: CT ABDOMEN AND PELVIS WITHOUT CONTRAST TECHNIQUE: Multidetector CT imaging of the abdomen and pelvis was performed following the standard protocol without IV contrast. COMPARISON:  CT 10/26/2021.  Renal ultrasound 10/27/2021 FINDINGS: Lower chest: Multiple bilateral basilar  pulmonary nodules. Cavitary nodule in the left lower lobe. Many of these nodules were not previously included in the field of view. There is increased size of a right lower lobe nodule currently measuring 12 by 6 mm, series 3, image 4, previously 11 x 5 mm. Small left pleural effusion is similar. Hepatobiliary: Stable fluid density lesions in the liver consistent with cysts. Some of the smaller low-density lesions are too small to characterize. No new liver lesion. Cholecystectomy. No biliary dilatation. Pancreas: Soft tissue density in the region of the pancreatic body adjacent to duodenal stent measures approximately 3 x 3.8 cm using same measurement technique, previously 4.1 x 3.5 cm. The remainder the pancreas is difficult to delineate. Spleen: Surgically absent. Adrenals/Urinary Tract: No adrenal nodule. Left nephroureteral stent in place with proximal pigtail in the renal pelvis and distal pigtail in the bladder. Despite appropriate stent placement there is persistent moderate left hydroureteronephrosis. Left perinephric edema has slightly increased. Small focus of air in the left renal collecting system is likely related to stent placement. No stone or stone fragments along the course of the stent or in the bladder. Urinary bladder is partially distended. Chronic moderate right hydronephrosis with renal parenchymal atrophy. Unchanged right renal cyst. Stable 6 mm stone in the distal right ureter, series 2, image 60. Stomach/Bowel: Bowel assessment is limited in the absence of enteric contrast and paucity of intra-abdominal fat. Postsurgical change of the stomach. Duodenal stent is unchanged in position. Proximal small bowel is nondilated. Mid small bowel is prominent with fecalization of  small bowel contents. More distal small bowel is decompressed. There is no mesenteric inflammation or swirling of mesenteric vasculature. Multifocal colonic diverticulosis. No acute diverticulitis. Moderate colonic stool  burden. Descending colostomy without peristomal hernia. Stapled off sigmoid colon in the pelvis. Vascular/Lymphatic: Mild aortic atherosclerosis. No aortic aneurysm. Limited assessment for adenopathy. No definite bulky enlarged abdominopelvic lymph nodes. Reproductive: Prominent prostate.  Left inguinal testis. Other: Trace free fluid in the pelvis. No free air. Postsurgical change of the abdominal wall. Tiny fat containing umbilical hernia. Musculoskeletal: Degenerative change throughout the spine and both hips. No acute osseous findings. IMPRESSION: 1. Left nephroureteral stent in place with persistent moderate left hydroureteronephrosis. Left perinephric edema has slightly increased. Small focus of air in the left renal collecting system is likely related to stent placement. No stone or stone fragments along the course of the stent or in the bladder. 2. Chronic moderate right hydronephrosis with renal parenchymal atrophy. Stable 6 mm stone in the distal right ureter. 3. Soft tissue density in the region of the pancreatic body adjacent to duodenal stent is unchanged in size from prior CT. 4. Multiple bilateral basilar pulmonary nodules consistent with pulmonary metastasis. Small left pleural effusion is similar. 5. Colonic diverticulosis without diverticulitis. 6. Fecalization of small bowel contents with slight increased diameter of distal small bowel suggests slow transit. No bowel obstruction. Aortic Atherosclerosis (ICD10-I70.0). Electronically Signed   By: Keith Rake M.D.   On: 11/09/2021 22:33   IR NEPHROSTOMY PLACEMENT LEFT  Result Date: 11/11/2021 INDICATION: 80 year old gentleman with history of metastatic pancreatic malignancy developed bilateral hydronephrosis. Retrograde left ureteral stent was placed, however patient return with worsening renal function and bilateral hydronephrosis. IR consulted for bilateral nephrostomy drain placement. EXAM: Ultrasound-guided bilateral nephrostomy drain  placement COMPARISON:  None. MEDICATIONS: Patient currently on inpatient regiment of IV Rocephin. ANESTHESIA/SEDATION: Fentanyl 125 mcg IV; Versed 2.5 mg IV Moderate Sedation Time:  20 minutes The patient was continuously monitored during the procedure by the interventional radiology nurse under my direct supervision. CONTRAST:  20 mL of Omnipaque 300-administered into the collecting system(s) FLUOROSCOPY TIME:  Fluoroscopy Time: 2 minutes 6 seconds (6 mGy). COMPLICATIONS: None immediate. PROCEDURE: Informed written consent was obtained from the patient after a thorough discussion of the procedural risks, benefits and alternatives. All questions were addressed. Maximal Sterile Barrier Technique was utilized including caps, mask, sterile gowns, sterile gloves, sterile drape, hand hygiene and skin antiseptic. A timeout was performed prior to the initiation of the procedure. Patient position prone on the procedure table. Bilateral flank skin prepped and draped usual fashion. Sterile ultrasound probe cover and gel utilized throughout the procedure. Following local lidocaine administration, 21 gauge needle inserted into the lower pole calyx of the left kidney using continuous ultrasound guidance. 21 gauge needle exchanged for transitional dilator set over 0.018 inch guidewire. Contrast administered through the present dilator set confirmed appropriate positioning within the renal collecting system. Transitional dilator set exchanged for 10 French multipurpose pigtail drain over 0.035 inch guidewire. Contrast administered through the drain under fluoroscopy confirmed appropriate positioning within the renal collecting system. Following the IV administration of 21 gauge needle inserted into the midpole calyx of the right kidney using continuous ultrasound guidance. 20 gauge needle exchanged for a transitional dilator set over 0.018 inch guidewire. Positional dilator set exchanged for 10 [place] multipurpose pigtail drain  over 0.035 inch guidewire. Contrast administered through the drain under fluoroscopy confirmed appropriate positioning within the renal collecting system. Both drains were secured to skin with suture and connected  to bag. IMPRESSION: Bilateral 10 French nephrostomy drain placement as above. Electronically Signed   By: Miachel Roux M.D.   On: 11/11/2021 15:03   IR NEPHROSTOMY PLACEMENT RIGHT  Result Date: 11/11/2021 INDICATION: 80 year old gentleman with history of metastatic pancreatic malignancy developed bilateral hydronephrosis. Retrograde left ureteral stent was placed, however patient return with worsening renal function and bilateral hydronephrosis. IR consulted for bilateral nephrostomy drain placement. EXAM: Ultrasound-guided bilateral nephrostomy drain placement COMPARISON:  None. MEDICATIONS: Patient currently on inpatient regiment of IV Rocephin. ANESTHESIA/SEDATION: Fentanyl 125 mcg IV; Versed 2.5 mg IV Moderate Sedation Time:  20 minutes The patient was continuously monitored during the procedure by the interventional radiology nurse under my direct supervision. CONTRAST:  20 mL of Omnipaque 300-administered into the collecting system(s) FLUOROSCOPY TIME:  Fluoroscopy Time: 2 minutes 6 seconds (6 mGy). COMPLICATIONS: None immediate. PROCEDURE: Informed written consent was obtained from the patient after a thorough discussion of the procedural risks, benefits and alternatives. All questions were addressed. Maximal Sterile Barrier Technique was utilized including caps, mask, sterile gowns, sterile gloves, sterile drape, hand hygiene and skin antiseptic. A timeout was performed prior to the initiation of the procedure. Patient position prone on the procedure table. Bilateral flank skin prepped and draped usual fashion. Sterile ultrasound probe cover and gel utilized throughout the procedure. Following local lidocaine administration, 21 gauge needle inserted into the lower pole calyx of the left kidney  using continuous ultrasound guidance. 21 gauge needle exchanged for transitional dilator set over 0.018 inch guidewire. Contrast administered through the present dilator set confirmed appropriate positioning within the renal collecting system. Transitional dilator set exchanged for 10 French multipurpose pigtail drain over 0.035 inch guidewire. Contrast administered through the drain under fluoroscopy confirmed appropriate positioning within the renal collecting system. Following the IV administration of 21 gauge needle inserted into the midpole calyx of the right kidney using continuous ultrasound guidance. 20 gauge needle exchanged for a transitional dilator set over 0.018 inch guidewire. Positional dilator set exchanged for 10 [place] multipurpose pigtail drain over 0.035 inch guidewire. Contrast administered through the drain under fluoroscopy confirmed appropriate positioning within the renal collecting system. Both drains were secured to skin with suture and connected to bag. IMPRESSION: Bilateral 10 French nephrostomy drain placement as above. Electronically Signed   By: Miachel Roux M.D.   On: 11/11/2021 15:03    Labs:  CBC: Recent Labs    10/27/21 1647 10/28/21 0351 11/09/21 2243 11/10/21 0435  WBC 9.2 10.7* 17.1* 19.5*  HGB 12.6* 13.5 12.9* 13.4  HCT 38.1* 40.5 39.8 42.1  PLT 529* 591* 439* 514*    COAGS: Recent Labs    06/02/21 0407 06/11/21 1907 11/11/21 1215  INR 1.1 1.3* 1.2  APTT  --  26  --     BMP: Recent Labs    11/10/21 0435 11/11/21 1040 11/11/21 1315 11/11/21 1744 11/12/21 0457  NA 136 134*  --  136 137  K 4.9 5.9* 5.5* 5.2* 5.5*  CL 105 103  --  106 106  CO2 22 21*  --  19* 23  GLUCOSE 113* 100*  --  155* 109*  BUN 40* 63*  --  64* 62*  CALCIUM 8.6* 8.0*  --  8.1* 8.4*  CREATININE 3.17* 6.24*  --  6.09* 4.67*  GFRNONAA 19* 9*  --  9* 12*    LIVER FUNCTION TESTS: Recent Labs    07/05/21 1201 09/10/21 1442 10/13/21 1530 10/27/21 1647  BILITOT  0.3 0.3 <0.2 0.3  AST 45*  23 25 13*  ALT 46* _0 ALKPHOS 69 58 74 50  PROT 7.5 7.5 7.4 7.0  ALBUMIN 3.7 4.0 4.3 3.1*    Assessment and Plan: Pt with hx met panc ca, SBO s/p diverting colostomy, CKD stage IIIa,right renal atrophy with right distal obstruction, new onset left hydro with left mid ureteral stricture or obstruction status post left stent 10/26/2021 ; s/p bilat PCN's 11/24; afebrile; creat 4.67(6.09), K 5.5, urine cx- staph epidermidis, blood cx- neg to date; cont with PCN flushes/ lab checks, monitor rt PCN OP closely; ? OP lasix renogram per urology; if pain, low OP persists consider f/u nephrostogram  Electronically Signed: D. Rowe Robert, PA-C 11/12/2021, 11:32 AM   I spent a total of 15 Minutes at the the patient's bedside AND on the patient's hospital floor or unit, greater than 50% of which was counseling/coordinating care for bilateral nephrostomies    Patient ID: Kevin Villegas, male   DOB: 05-Nov-1941, 80 y.o.   MRN: [id number]

## 2021-11-12 NOTE — Progress Notes (Signed)
Right nephrostomy output 25 mL of clear, red urine overnight. L nephrostomy output 1.650 mL of yellow clear urine.

## 2021-11-12 NOTE — Progress Notes (Signed)
PROGRESS NOTE    Kevin Villegas  EGB:[id number] DOB: June 28, 1941 DOA: 11/09/2021 PCP: Dettinger, [name], MD   Brief Narrative: Kevin Villegas is a 80 y.o. male with a history of metastatic pancreatic cancer, SBO s/p diverting colostomy, CKD stage IIIa, chronic right nephrolithiasis with associated chronic right hydronephrosis and recent left hydronephrosis with AKI. Patient presented with left flank pain and decreased urine output and found to have evidence of persistent left sided hydronephrosis and left pyelonephritis. Empiric Ceftriaxone initiated. Blood and urine cultures obtained and are pending. Patient initially oliguric; urology consulted. Bilateral percutaneous nephrostomy tubes placed on 11/24.    Assessment & Plan:   * Acute pyelonephritis In setting of recent ureteral obstruction, s/p left ureteral stent placement. Empirically started on Ceftriaxone IV. Urine and blood cultures obtained on admission. Urine culture significant for staph epidermidis which I am not convinced is the culprit bacteria -Continue Ceftriaxone IV -Repeat urine culture  Acute kidney injury superimposed on chronic kidney disease ([place]) In setting of recent ureteral obstruction and is s/p ureteral stent. Persistent hydronephrosis. Currently oliguric. Urology consulted and recommended foley catheter. Bilateral nephrostomy drains placed on 11/24. Creatinine is trending down after drain placement -Urology recommendations: bilateral nephrostomy tubes -Strict in/out  Right ureteral stone Chronic issue. Stable. Associated right renal atrophy.  Colostomy in place [facility]) Noted.  Essential hypertension Blood pressure uncontrolled. Started on amlodipine 5 mg daily on admission -Continue amlodipine; titrate up as needed  Hydronephrosis of left kidney Persistent s/p ureteral stent from prior admission. Increased creatinine. Urology consulted. Nephrostomy drain placed.  Constipation -Miralax BID  Stage 3a  chronic kidney disease (CKD) ([facility]) Baseline creatinine of about 1.3-1.6  Hyperkalemia Likely secondary to AKI. Stabilized. -Lokelma 10 g x1      DVT prophylaxis: Heparin subq Code Status:   Code Status: Full Code Family Communication: None at bedside Disposition Plan: Discharge home likely in several days pending continued treatment of pyelonephritis, urology recommendations, improvement of urine output/creatinine   Consultants:  Urology  Procedures:  Foley Catheter (11/23 >> NEPHROSTOMY DRAIN PLACEMENT (11/11/2021)  Antimicrobials: Ceftriaxone IV    Subjective: Wants the foley catheter removed. Encouraged by urine output from left nephrostomy.  Objective: Vitals:   11/11/21 1631 11/11/21 1716 11/11/21 2122 11/12/21 0608  BP:   132/84 130/82  Pulse:   71 70  Resp: 20 18 18 17   Temp:   98.4 F (36.9 C) 98.1 F (36.7 C)  TempSrc:   Oral Oral  SpO2:   95% 100%  Weight:      Height:        Intake/Output Summary (Last 24 hours) at 11/12/2021 0959 Last data filed at 11/12/2021 0853 Gross per 24 hour  Intake 1070 ml  Output 2750 ml  Net -1680 ml    Filed Weights   11/09/21 2142  Weight: 63.5 kg    Examination:  General exam: Appears calm and comfortable Respiratory system: Clear to auscultation. Respiratory effort normal. Cardiovascular system: S1 & S2 heard, RRR. 1/6 systolic murmur Gastrointestinal system: Abdomen is nondistended, soft and nontender. No organomegaly or masses felt. Normal bowel sounds heard. Central nervous system: Alert and oriented. No focal neurological deficits. Musculoskeletal: No edema. No calf tenderness Skin: No cyanosis. No rashes Psychiatry: Judgement and insight appear normal. Mood & affect appropriate.     Data Reviewed: I have personally reviewed following labs and imaging studies  CBC Lab Results  Component Value Date   WBC 19.5 (H) 11/10/2021   RBC 4.48 11/10/2021   HGB 13.4  11/10/2021   HCT 42.1 11/10/2021    MCV 94.0 11/10/2021   MCH 29.9 11/10/2021   PLT 514 (H) 11/10/2021   MCHC 31.8 11/10/2021   RDW 13.8 11/10/2021   LYMPHSABS 2.0 11/09/2021   MONOABS 1.9 (H) 11/09/2021   EOSABS 0.3 11/09/2021   BASOSABS 0.1 85/46/2703     Last metabolic panel Lab Results  Component Value Date   NA 137 11/12/2021   K 5.5 (H) 11/12/2021   CL 106 11/12/2021   CO2 23 11/12/2021   BUN 62 (H) 11/12/2021   CREATININE 4.67 (H) 11/12/2021   GLUCOSE 109 (H) 11/12/2021   GFRNONAA 12 (L) 11/12/2021   GFRAA >60 11/24/2019   CALCIUM 8.4 (L) 11/12/2021   PHOS 3.1 06/16/2021   PROT 7.0 10/27/2021   ALBUMIN 3.1 (L) 10/27/2021   LABGLOB 3.1 10/13/2021   AGRATIO 1.4 10/13/2021   BILITOT 0.3 10/27/2021   ALKPHOS 50 10/27/2021   AST 13 (L) 10/27/2021   ALT 16 10/27/2021   ANIONGAP 8 11/12/2021    CBG (last 3)  No results for input(s): GLUCAP in the last 72 hours.   GFR: Estimated Creatinine Clearance: 11.5 mL/min (A) (by C-G formula based on SCr of 4.67 mg/dL (H)).  Coagulation Profile: Recent Labs  Lab 11/11/21 1215  INR 1.2    Recent Results (from the past 240 hour(s))  Urine Culture     Status: Abnormal   Collection Time: 11/10/21 12:14 AM   Specimen: In/Out Cath Urine  Result Value Ref Range Status   Specimen Description   Final    IN/OUT CATH URINE Performed at [facility], [address] [place], [place] [address]    Special Requests   Final    NONE Performed at [facility], [address] [place], [place] [address]    Culture 10,000 COLONIES/mL STAPHYLOCOCCUS EPIDERMIDIS (A)  Final   Report Status 11/11/2021 FINAL  Final  Resp Panel by RT-PCR (Flu A&B, Covid) Nasopharyngeal Swab     Status: None   Collection Time: 11/10/21  1:10 AM   Specimen: Nasopharyngeal Swab; Nasopharyngeal(NP) swabs in vial transport medium  Result Value Ref Range Status   SARS Coronavirus 2 by RT PCR NEGATIVE NEGATIVE Final    Comment: (NOTE) SARS-CoV-2 target nucleic acids are  NOT DETECTED.  The SARS-CoV-2 RNA is generally detectable in upper respiratory specimens during the acute phase of infection. The lowest concentration of SARS-CoV-2 viral copies this assay can detect is 138 copies/mL. A negative result does not preclude SARS-Cov-2 infection and should not be used as the sole basis for treatment or other patient management decisions. A negative result may occur with  improper specimen collection/handling, submission of specimen other than nasopharyngeal swab, presence of viral mutation(s) within the areas targeted by this assay, and inadequate number of viral copies(<138 copies/mL). A negative result must be combined with clinical observations, patient history, and epidemiological information. The expected result is Negative.  Fact Sheet for Patients:  EntrepreneurPulse.com.au  Fact Sheet for Healthcare Providers:  IncredibleEmployment.be  This test is no t yet approved or cleared by the [place] FDA and  has been authorized for detection and/or diagnosis of SARS-CoV-2 by FDA under an Emergency Use Authorization (EUA). This EUA will remain  in effect (meaning this test can be used) for the duration of the COVID-19 declaration under Section 564(b)(1) of the Act, 21 U.S.C.section 360bbb-3(b)(1), unless the authorization is terminated  or revoked sooner.  Influenza A by PCR NEGATIVE NEGATIVE Final   Influenza B by PCR NEGATIVE NEGATIVE Final    Comment: (NOTE) The Xpert Xpress SARS-CoV-2/FLU/RSV plus assay is intended as an aid in the diagnosis of influenza from Nasopharyngeal swab specimens and should not be used as a sole basis for treatment. Nasal washings and aspirates are unacceptable for Xpert Xpress SARS-CoV-2/FLU/RSV testing.  Fact Sheet for Patients: EntrepreneurPulse.com.au  Fact Sheet for Healthcare Providers: IncredibleEmployment.be  This test is not  yet approved or cleared by the [place] FDA and has been authorized for detection and/or diagnosis of SARS-CoV-2 by FDA under an Emergency Use Authorization (EUA). This EUA will remain in effect (meaning this test can be used) for the duration of the COVID-19 declaration under Section 564(b)(1) of the Act, 21 U.S.C. section 360bbb-3(b)(1), unless the authorization is terminated or revoked.  Performed at [facility], [address] [place], [place] [address]   Culture, blood (routine x 2)     Status: None (Preliminary result)   Collection Time: 11/10/21  5:30 AM   Specimen: BLOOD  Result Value Ref Range Status   Specimen Description   Final    BLOOD RIGHT ARM Performed at [facility] [address] [place], [place] [address]    Special Requests   Final    BOTTLES DRAWN AEROBIC ONLY Blood Culture adequate volume Performed at [facility] [address] [place], [place] [address]    Culture   Final    NO GROWTH 1 DAY Performed at [facility], [place] [address], [place], [place] [address]    Report Status PENDING  Incomplete        Radiology Studies: IR NEPHROSTOMY PLACEMENT LEFT  Result Date: 11/11/2021 INDICATION: 80 year old gentleman with history of metastatic pancreatic malignancy developed bilateral hydronephrosis. Retrograde left ureteral stent was placed, however patient return with worsening renal function and bilateral hydronephrosis. IR consulted for bilateral nephrostomy drain placement. EXAM: Ultrasound-guided bilateral nephrostomy drain placement COMPARISON:  None. MEDICATIONS: Patient currently on inpatient regiment of IV Rocephin. ANESTHESIA/SEDATION: Fentanyl 125 mcg IV; Versed 2.5 mg IV Moderate Sedation Time:  20 minutes The patient was continuously monitored during the procedure by the interventional radiology nurse under my direct supervision. CONTRAST:  20 mL of Omnipaque 300-administered into the  collecting system(s) FLUOROSCOPY TIME:  Fluoroscopy Time: 2 minutes 6 seconds (6 mGy). COMPLICATIONS: None immediate. PROCEDURE: Informed written consent was obtained from the patient after a thorough discussion of the procedural risks, benefits and alternatives. All questions were addressed. Maximal Sterile Barrier Technique was utilized including caps, mask, sterile gowns, sterile gloves, sterile drape, hand hygiene and skin antiseptic. A timeout was performed prior to the initiation of the procedure. Patient position prone on the procedure table. Bilateral flank skin prepped and draped usual fashion. Sterile ultrasound probe cover and gel utilized throughout the procedure. Following local lidocaine administration, 21 gauge needle inserted into the lower pole calyx of the left kidney using continuous ultrasound guidance. 21 gauge needle exchanged for transitional dilator set over 0.018 inch guidewire. Contrast administered through the present dilator set confirmed appropriate positioning within the renal collecting system. Transitional dilator set exchanged for 10 French multipurpose pigtail drain over 0.035 inch guidewire. Contrast administered through the drain under fluoroscopy confirmed appropriate positioning within the renal collecting system. Following the IV administration of 21 gauge needle inserted into the midpole calyx of the right kidney using continuous ultrasound guidance. 20 gauge needle exchanged for a transitional dilator  set over 0.018 inch guidewire. Positional dilator set exchanged for 10 [place] multipurpose pigtail drain over 0.035 inch guidewire. Contrast administered through the drain under fluoroscopy confirmed appropriate positioning within the renal collecting system. Both drains were secured to skin with suture and connected to bag. IMPRESSION: Bilateral 10 French nephrostomy drain placement as above. Electronically Signed   By: Miachel Roux M.D.   On: 11/11/2021 15:03   IR  NEPHROSTOMY PLACEMENT RIGHT  Result Date: 11/11/2021 INDICATION: 80 year old gentleman with history of metastatic pancreatic malignancy developed bilateral hydronephrosis. Retrograde left ureteral stent was placed, however patient return with worsening renal function and bilateral hydronephrosis. IR consulted for bilateral nephrostomy drain placement. EXAM: Ultrasound-guided bilateral nephrostomy drain placement COMPARISON:  None. MEDICATIONS: Patient currently on inpatient regiment of IV Rocephin. ANESTHESIA/SEDATION: Fentanyl 125 mcg IV; Versed 2.5 mg IV Moderate Sedation Time:  20 minutes The patient was continuously monitored during the procedure by the interventional radiology nurse under my direct supervision. CONTRAST:  20 mL of Omnipaque 300-administered into the collecting system(s) FLUOROSCOPY TIME:  Fluoroscopy Time: 2 minutes 6 seconds (6 mGy). COMPLICATIONS: None immediate. PROCEDURE: Informed written consent was obtained from the patient after a thorough discussion of the procedural risks, benefits and alternatives. All questions were addressed. Maximal Sterile Barrier Technique was utilized including caps, mask, sterile gowns, sterile gloves, sterile drape, hand hygiene and skin antiseptic. A timeout was performed prior to the initiation of the procedure. Patient position prone on the procedure table. Bilateral flank skin prepped and draped usual fashion. Sterile ultrasound probe cover and gel utilized throughout the procedure. Following local lidocaine administration, 21 gauge needle inserted into the lower pole calyx of the left kidney using continuous ultrasound guidance. 21 gauge needle exchanged for transitional dilator set over 0.018 inch guidewire. Contrast administered through the present dilator set confirmed appropriate positioning within the renal collecting system. Transitional dilator set exchanged for 10 French multipurpose pigtail drain over 0.035 inch guidewire. Contrast  administered through the drain under fluoroscopy confirmed appropriate positioning within the renal collecting system. Following the IV administration of 21 gauge needle inserted into the midpole calyx of the right kidney using continuous ultrasound guidance. 20 gauge needle exchanged for a transitional dilator set over 0.018 inch guidewire. Positional dilator set exchanged for 10 [place] multipurpose pigtail drain over 0.035 inch guidewire. Contrast administered through the drain under fluoroscopy confirmed appropriate positioning within the renal collecting system. Both drains were secured to skin with suture and connected to bag. IMPRESSION: Bilateral 10 French nephrostomy drain placement as above. Electronically Signed   By: Miachel Roux M.D.   On: 11/11/2021 15:03        Scheduled Meds:  amLODipine  5 mg Oral Daily   Chlorhexidine Gluconate Cloth  6 each Topical Daily   heparin injection (subcutaneous)  5,000 Units Subcutaneous Q8H   lidocaine (PF)  20 mL Intradermal Once   polyethylene glycol  17 g Oral BID   sodium chloride flush  10-40 mL Intracatheter Q12H   sodium chloride flush  5 mL Intracatheter Q8H   sodium zirconium cyclosilicate  10 g Oral Daily   tamsulosin  0.4 mg Oral QPC supper   Continuous Infusions:  cefTRIAXone (ROCEPHIN)  IV Stopped (11/12/21 0103)     LOS: 1 day     Cordelia Poche, MD [facility] 11/12/2021, 9:59 AM  If 7PM-7AM, please contact night-coverage [email or web address]

## 2021-11-12 NOTE — Plan of Care (Signed)
  Problem: Coping: Goal: Level of anxiety will decrease Outcome: Progressing   Problem: Elimination: Goal: Will not experience complications related to urinary retention Outcome: Progressing   Problem: Pain Managment: Goal: General experience of comfort will improve Outcome: Progressing   

## 2021-11-13 LAB — BASIC METABOLIC PANEL
Anion gap: 9 (ref 5–15)
BUN: 45 mg/dL — ABNORMAL HIGH (ref 8–23)
CO2: 24 mmol/L (ref 22–32)
Calcium: 8.6 mg/dL — ABNORMAL LOW (ref 8.9–10.3)
Chloride: 102 mmol/L (ref 98–111)
Creatinine, Ser: 2.61 mg/dL — ABNORMAL HIGH (ref 0.61–1.24)
GFR, Estimated: 24 mL/min — ABNORMAL LOW (ref >60)
Glucose, Bld: 111 mg/dL — ABNORMAL HIGH (ref 70–99)
Potassium: 4.6 mmol/L (ref 3.5–5.1)
Sodium: 135 mmol/L (ref 135–145)

## 2021-11-13 LAB — CBC
HCT: 40.5 % (ref 39.0–52.0)
Hemoglobin: 13 g/dL (ref 13.0–17.0)
MCH: 29.9 pg (ref 26.0–34.0)
MCHC: 32.1 g/dL (ref 30.0–36.0)
MCV: 93.1 fL (ref 80.0–100.0)
Platelets: 403 10*3/uL — ABNORMAL HIGH (ref 150–400)
RBC: 4.35 MIL/uL (ref 4.22–5.81)
RDW: 13.9 % (ref 11.5–15.5)
WBC: 9.6 10*3/uL (ref 4.0–10.5)
nRBC: 0 % (ref 0.0–0.2)

## 2021-11-13 MED ORDER — SODIUM CHLORIDE 0.9 % IV SOLN: INTRAVENOUS | Status: DC | PRN

## 2021-11-13 MED ORDER — CEFDINIR 300 MG PO CAPS: 300.0000 mg | ORAL_CAPSULE | Freq: Every day | ORAL | 0 refills | Status: AC

## 2021-11-13 MED ORDER — AMLODIPINE BESYLATE 5 MG PO TABS: 5.0000 mg | ORAL_TABLET | Freq: Every day | ORAL | 0 refills | Status: DC

## 2021-11-13 MED ORDER — TAMSULOSIN HCL 0.4 MG PO CAPS: 0.4000 mg | ORAL_CAPSULE | Freq: Every day | ORAL | 0 refills | Status: AC

## 2021-11-13 NOTE — Discharge Summary (Signed)
Physician Discharge Summary  Kevin Villegas ZOX:[id number] DOB: Aug 03, 1941 DOA: 11/09/2021  PCP: Worthy Rancher, MD  Admit date: 11/09/2021 Discharge date: 11/13/2021  Admitted From: Home Disposition: Home  Recommendations for Outpatient Follow-up:  Follow up with PCP in 1 week Follow up with urology Please obtain BMP/CBC in one week Please follow up on the following pending results: Urine culture (11/25)  Home Health: None Equipment/Devices: Bilateral nephrostomy tubes  Discharge Condition: Stable CODE STATUS: Full code Diet recommendation: Renal diet   Brief/Interim Summary:  Admission HPI written by Harrold Donath, MD   HPI: Kevin Villegas is a 80 y.o. male with medical history significant for  pancreatic cancer with metastatic disease that was complicated by distal bowel obstruction status post diverting colectomy, CKD stage 4, recent left-sided ureteral stent placement for obstruction who presents with a sudden onset of left flank pain that he was severe when it started.  He reports pain began around 4 PM with no aggravating factors.  He denies any injury to his back or abdomen.  He states at the time it started he also had urinary retention and was not able to urinate well.  He states he felt the same way when he was found to have a urinary obstruction a few weeks ago that required urological procedure with stent placement.  He is very concerned that the stent has become obstructed.  He has not had any fevers chills chest pain shortness of breath cough nausea vomiting or diarrhea.  He is followed by oncology but is currently not undergoing chemotherapy.    Hospital course:  * Acute pyelonephritis In setting of recent ureteral obstruction, s/p left ureteral stent placement. Empirically started on Ceftriaxone IV. Urine and blood cultures obtained on admission. Urine culture significant for staph epidermidis which I am not convinced is the culprit bacteria. Repeat  culture pending on discharge, but with improvement, will transition to Cefdinir on discharge to complete a 7 day course of antibiotics post-nephrostomy tube placement.  Acute kidney injury superimposed on chronic kidney disease ([place]) In setting of recent ureteral obstruction and is s/p ureteral stent. Persistent hydronephrosis. Currently oliguric. Urology consulted and recommended foley catheter. Bilateral nephrostomy drains placed on 11/24. Creatinine is trending down after drain placement. Creatinine of 2.61 on discharge. Patient to follow-up with urology as an outpatient. Foley catheter removed prior to discharge.  Right ureteral stone Chronic issue. Stable. Associated right renal atrophy.  Colostomy in place [facility]) Noted.  Essential hypertension Blood pressure uncontrolled. Started on amlodipine 5 mg daily on admission. Continue on discharge.  Hydronephrosis of left kidney Persistent s/p ureteral stent from prior admission. Increased creatinine. Urology consulted. Nephrostomy drain placed. Outpatient urology follow-up  Constipation Miralax  Stage 3a chronic kidney disease (CKD) (HCC) Baseline creatinine of about 1.3-1.6  Hyperkalemia Likely secondary to AKI. Lokelma given. Stabilized.   Discharge Diagnoses:  Principal Problem:   Acute pyelonephritis Active Problems:   Acute kidney injury superimposed on chronic kidney disease ([place])   Right ureteral stone   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  with metastatic disease   Hydronephrosis of left kidney   Essential hypertension   Colostomy in place [facility])   Hyperkalemia   Complicated UTI (urinary tract infection)   Stage 3a chronic kidney disease (CKD) ([place])   Constipation    Discharge Instructions   Allergies as of 11/13/2021       Reactions   Pollen Extract Other (See Comments)   Sinus congestion  Medication List     STOP taking these medications    phenazopyridine 200 MG  tablet Commonly known as: PYRIDIUM   traMADol 50 MG tablet Commonly known as: ULTRAM       TAKE these medications    amLODipine 5 MG tablet Commonly known as: NORVASC Take 1 tablet (5 mg total) by mouth daily. Start taking on: November 14, 2021   cefdinir 300 MG capsule Commonly known as: OMNICEF Take 1 capsule (300 mg total) by mouth daily for 5 days. Start taking on: November 14, 2021   oxymetazoline 0.05 % nasal spray Commonly known as: AFRIN Place 1 spray into both nostrils 2 (two) times daily as needed for congestion.   polyethylene glycol 17 g packet Commonly known as: MIRALAX / GLYCOLAX Take 17 g by mouth daily.   tamsulosin 0.4 MG Caps capsule Commonly known as: FLOMAX Take 1 capsule (0.4 mg total) by mouth daily after supper.        Follow-up Information     Dettinger, [name], MD. Schedule an appointment as soon as possible for a visit in 1 week(s).   Specialties: Family Medicine, Cardiology Why: For hospital follow-up Contact information: [place] [address] [phone]         Ardis Hughs, MD Follow up.   Specialty: Urology Contact information: [address] [place] [address] [phone]                Allergies  Allergen Reactions   Pollen Extract Other (See Comments)    Sinus congestion    Consultations: Urology Interventional radiology   Procedures/Studies: US Renal  Result Date: 10/27/2021 CLINICAL DATA:  Flank pain kidney stone. EXAM: RENAL / URINARY TRACT ULTRASOUND COMPLETE COMPARISON:  None. FINDINGS: Right Kidney: Renal measurements: 10.4 x 5.5 x 5.1 cm = volume: 0.2 mL. Echogenicity within normal limits. There is a 2.8 cm exophytic simple appearing cystic lesion. No solid mass. Moderate hydronephrosis visualized. Left Kidney: Renal measurements: 11.9 x 6.4 x 6.1 cm = volume: 241 mL. Echogenicity within normal limits. No solid mass. At least mild hydronephrosis visualized. Urinary bladder:  Decompressed and not well visualized. Other: None. IMPRESSION: 1. Moderate right and mild left hydronephrosis. 2. Decompressed and not well visualized urinary bladder. Electronically Signed   By: Iven Finn M.D.   On: 10/27/2021 17:31   DG C-Arm 1-60 Min-No Report  Result Date: 10/27/2021 Fluoroscopy was utilized by the requesting physician.  No radiographic interpretation.   CT Renal Stone Study  Result Date: 11/09/2021 CLINICAL DATA:  Flank pain, kidney stone suspected recent stent in left kidney. need to evaluate placement EXAM: CT ABDOMEN AND PELVIS WITHOUT CONTRAST TECHNIQUE: Multidetector CT imaging of the abdomen and pelvis was performed following the standard protocol without IV contrast. COMPARISON:  CT 10/26/2021.  Renal ultrasound 10/27/2021 FINDINGS: Lower chest: Multiple bilateral basilar pulmonary nodules. Cavitary nodule in the left lower lobe. Many of these nodules were not previously included in the field of view. There is increased size of a right lower lobe nodule currently measuring 12 by 6 mm, series 3, image 4, previously 11 x 5 mm. Small left pleural effusion is similar. Hepatobiliary: Stable fluid density lesions in the liver consistent with cysts. Some of the smaller low-density lesions are too small to characterize. No new liver lesion. Cholecystectomy. No biliary dilatation. Pancreas: Soft tissue density in the region of the pancreatic body adjacent to duodenal stent measures approximately 3 x 3.8 cm using same measurement technique,  previously 4.1 x 3.5 cm. The remainder the pancreas is difficult to delineate. Spleen: Surgically absent. Adrenals/Urinary Tract: No adrenal nodule. Left nephroureteral stent in place with proximal pigtail in the renal pelvis and distal pigtail in the bladder. Despite appropriate stent placement there is persistent moderate left hydroureteronephrosis. Left perinephric edema has slightly increased. Small focus of air in the left renal collecting  system is likely related to stent placement. No stone or stone fragments along the course of the stent or in the bladder. Urinary bladder is partially distended. Chronic moderate right hydronephrosis with renal parenchymal atrophy. Unchanged right renal cyst. Stable 6 mm stone in the distal right ureter, series 2, image 60. Stomach/Bowel: Bowel assessment is limited in the absence of enteric contrast and paucity of intra-abdominal fat. Postsurgical change of the stomach. Duodenal stent is unchanged in position. Proximal small bowel is nondilated. Mid small bowel is prominent with fecalization of small bowel contents. More distal small bowel is decompressed. There is no mesenteric inflammation or swirling of mesenteric vasculature. Multifocal colonic diverticulosis. No acute diverticulitis. Moderate colonic stool burden. Descending colostomy without peristomal hernia. Stapled off sigmoid colon in the pelvis. Vascular/Lymphatic: Mild aortic atherosclerosis. No aortic aneurysm. Limited assessment for adenopathy. No definite bulky enlarged abdominopelvic lymph nodes. Reproductive: Prominent prostate.  Left inguinal testis. Other: Trace free fluid in the pelvis. No free air. Postsurgical change of the abdominal wall. Tiny fat containing umbilical hernia. Musculoskeletal: Degenerative change throughout the spine and both hips. No acute osseous findings. IMPRESSION: 1. Left nephroureteral stent in place with persistent moderate left hydroureteronephrosis. Left perinephric edema has slightly increased. Small focus of air in the left renal collecting system is likely related to stent placement. No stone or stone fragments along the course of the stent or in the bladder. 2. Chronic moderate right hydronephrosis with renal parenchymal atrophy. Stable 6 mm stone in the distal right ureter. 3. Soft tissue density in the region of the pancreatic body adjacent to duodenal stent is unchanged in size from prior CT. 4. Multiple  bilateral basilar pulmonary nodules consistent with pulmonary metastasis. Small left pleural effusion is similar. 5. Colonic diverticulosis without diverticulitis. 6. Fecalization of small bowel contents with slight increased diameter of distal small bowel suggests slow transit. No bowel obstruction. Aortic Atherosclerosis (ICD10-I70.0). Electronically Signed   By: Keith Rake M.D.   On: 11/09/2021 22:33   IR NEPHROSTOMY PLACEMENT LEFT  Result Date: 11/11/2021 INDICATION: 80 year old gentleman with history of metastatic pancreatic malignancy developed bilateral hydronephrosis. Retrograde left ureteral stent was placed, however patient return with worsening renal function and bilateral hydronephrosis. IR consulted for bilateral nephrostomy drain placement. EXAM: Ultrasound-guided bilateral nephrostomy drain placement COMPARISON:  None. MEDICATIONS: Patient currently on inpatient regiment of IV Rocephin. ANESTHESIA/SEDATION: Fentanyl 125 mcg IV; Versed 2.5 mg IV Moderate Sedation Time:  20 minutes The patient was continuously monitored during the procedure by the interventional radiology nurse under my direct supervision. CONTRAST:  20 mL of Omnipaque 300-administered into the collecting system(s) FLUOROSCOPY TIME:  Fluoroscopy Time: 2 minutes 6 seconds (6 mGy). COMPLICATIONS: None immediate. PROCEDURE: Informed written consent was obtained from the patient after a thorough discussion of the procedural risks, benefits and alternatives. All questions were addressed. Maximal Sterile Barrier Technique was utilized including caps, mask, sterile gowns, sterile gloves, sterile drape, hand hygiene and skin antiseptic. A timeout was performed prior to the initiation of the procedure. Patient position prone on the procedure table. Bilateral flank skin prepped and draped usual fashion. Sterile ultrasound probe cover and  gel utilized throughout the procedure. Following local lidocaine administration, 21 gauge needle  inserted into the lower pole calyx of the left kidney using continuous ultrasound guidance. 21 gauge needle exchanged for transitional dilator set over 0.018 inch guidewire. Contrast administered through the present dilator set confirmed appropriate positioning within the renal collecting system. Transitional dilator set exchanged for 10 French multipurpose pigtail drain over 0.035 inch guidewire. Contrast administered through the drain under fluoroscopy confirmed appropriate positioning within the renal collecting system. Following the IV administration of 21 gauge needle inserted into the midpole calyx of the right kidney using continuous ultrasound guidance. 20 gauge needle exchanged for a transitional dilator set over 0.018 inch guidewire. Positional dilator set exchanged for 10 [place] multipurpose pigtail drain over 0.035 inch guidewire. Contrast administered through the drain under fluoroscopy confirmed appropriate positioning within the renal collecting system. Both drains were secured to skin with suture and connected to bag. IMPRESSION: Bilateral 10 French nephrostomy drain placement as above. Electronically Signed   By: Miachel Roux M.D.   On: 11/11/2021 15:03   IR NEPHROSTOMY PLACEMENT RIGHT  Result Date: 11/11/2021 INDICATION: 80 year old gentleman with history of metastatic pancreatic malignancy developed bilateral hydronephrosis. Retrograde left ureteral stent was placed, however patient return with worsening renal function and bilateral hydronephrosis. IR consulted for bilateral nephrostomy drain placement. EXAM: Ultrasound-guided bilateral nephrostomy drain placement COMPARISON:  None. MEDICATIONS: Patient currently on inpatient regiment of IV Rocephin. ANESTHESIA/SEDATION: Fentanyl 125 mcg IV; Versed 2.5 mg IV Moderate Sedation Time:  20 minutes The patient was continuously monitored during the procedure by the interventional radiology nurse under my direct supervision. CONTRAST:  20 mL of  Omnipaque 300-administered into the collecting system(s) FLUOROSCOPY TIME:  Fluoroscopy Time: 2 minutes 6 seconds (6 mGy). COMPLICATIONS: None immediate. PROCEDURE: Informed written consent was obtained from the patient after a thorough discussion of the procedural risks, benefits and alternatives. All questions were addressed. Maximal Sterile Barrier Technique was utilized including caps, mask, sterile gowns, sterile gloves, sterile drape, hand hygiene and skin antiseptic. A timeout was performed prior to the initiation of the procedure. Patient position prone on the procedure table. Bilateral flank skin prepped and draped usual fashion. Sterile ultrasound probe cover and gel utilized throughout the procedure. Following local lidocaine administration, 21 gauge needle inserted into the lower pole calyx of the left kidney using continuous ultrasound guidance. 21 gauge needle exchanged for transitional dilator set over 0.018 inch guidewire. Contrast administered through the present dilator set confirmed appropriate positioning within the renal collecting system. Transitional dilator set exchanged for 10 French multipurpose pigtail drain over 0.035 inch guidewire. Contrast administered through the drain under fluoroscopy confirmed appropriate positioning within the renal collecting system. Following the IV administration of 21 gauge needle inserted into the midpole calyx of the right kidney using continuous ultrasound guidance. 20 gauge needle exchanged for a transitional dilator set over 0.018 inch guidewire. Positional dilator set exchanged for 10 [place] multipurpose pigtail drain over 0.035 inch guidewire. Contrast administered through the drain under fluoroscopy confirmed appropriate positioning within the renal collecting system. Both drains were secured to skin with suture and connected to bag. IMPRESSION: Bilateral 10 French nephrostomy drain placement as above. Electronically Signed   By: Miachel Roux M.D.    On: 11/11/2021 15:03      Subjective: Significant urine output. No other concerns.  Discharge Exam: Vitals:   11/12/21 1429 11/12/21 2103  BP: (!) 147/93 (!) 147/91  Pulse: 70 74  Resp: 16 19  Temp: 97.6 F (36.4 C)  97.8 F (36.6 C)  SpO2: 100% 98%   Vitals:   11/11/21 2122 11/12/21 0608 11/12/21 1429 11/12/21 2103  BP: 132/84 130/82 (!) 147/93 (!) 147/91  Pulse: 71 70 70 74  Resp: 18 17 16 19   Temp: 98.4 F (36.9 C) 98.1 F (36.7 C) 97.6 F (36.4 C) 97.8 F (36.6 C)  TempSrc: Oral Oral Oral Oral  SpO2: 95% 100% 100% 98%  Weight:      Height:        General: Pt is alert, awake, not in acute distress Cardiovascular: RRR, S1/S2 +, no rubs, no gallops Respiratory: CTA bilaterally, no wheezing, no rhonchi Abdominal: Soft, NT, ND, bowel sounds + Extremities: no edema, no cyanosis    The results of significant diagnostics from this hospitalization (including imaging, microbiology, ancillary and laboratory) are listed below for reference.     Microbiology: Recent Results (from the past 240 hour(s))  Urine Culture     Status: Abnormal   Collection Time: 11/10/21 12:14 AM   Specimen: In/Out Cath Urine  Result Value Ref Range Status   Specimen Description   Final    IN/OUT CATH URINE Performed at [facility], [address] [place], [place] [address]    Special Requests   Final    NONE Performed at [facility], [address] [place], [place] [address]    Culture 10,000 COLONIES/mL STAPHYLOCOCCUS EPIDERMIDIS (A)  Final   Report Status 11/11/2021 FINAL  Final  Resp Panel by RT-PCR (Flu A&B, Covid) Nasopharyngeal Swab     Status: None   Collection Time: 11/10/21  1:10 AM   Specimen: Nasopharyngeal Swab; Nasopharyngeal(NP) swabs in vial transport medium  Result Value Ref Range Status   SARS Coronavirus 2 by RT PCR NEGATIVE NEGATIVE Final    Comment: (NOTE) SARS-CoV-2 target nucleic acids are NOT DETECTED.  The SARS-CoV-2 RNA is  generally detectable in upper respiratory specimens during the acute phase of infection. The lowest concentration of SARS-CoV-2 viral copies this assay can detect is 138 copies/mL. A negative result does not preclude SARS-Cov-2 infection and should not be used as the sole basis for treatment or other patient management decisions. A negative result may occur with  improper specimen collection/handling, submission of specimen other than nasopharyngeal swab, presence of viral mutation(s) within the areas targeted by this assay, and inadequate number of viral copies(<138 copies/mL). A negative result must be combined with clinical observations, patient history, and epidemiological information. The expected result is Negative.  Fact Sheet for Patients:  EntrepreneurPulse.com.au  Fact Sheet for Healthcare Providers:  IncredibleEmployment.be  This test is no t yet approved or cleared by the [place] FDA and  has been authorized for detection and/or diagnosis of SARS-CoV-2 by FDA under an Emergency Use Authorization (EUA). This EUA will remain  in effect (meaning this test can be used) for the duration of the COVID-19 declaration under Section 564(b)(1) of the Act, 21 U.S.C.section 360bbb-3(b)(1), unless the authorization is terminated  or revoked sooner.       Influenza A by PCR NEGATIVE NEGATIVE Final   Influenza B by PCR NEGATIVE NEGATIVE Final    Comment: (NOTE) The Xpert Xpress SARS-CoV-2/FLU/RSV plus assay is intended as an aid in the diagnosis of influenza from Nasopharyngeal swab specimens and should not be used as a sole basis for treatment. Nasal washings and aspirates are unacceptable for Xpert Xpress SARS-CoV-2/FLU/RSV testing.  Fact Sheet for Patients: EntrepreneurPulse.com.au  Fact Sheet for Healthcare Providers: IncredibleEmployment.be  This test is not yet approved or cleared by the [place] and has been authorized for detection and/or diagnosis of SARS-CoV-2 by FDA under an Emergency Use Authorization (EUA). This EUA will remain in effect (meaning this test can be used) for the duration of the COVID-19 declaration under Section 564(b)(1) of the Act, 21 U.S.C. section 360bbb-3(b)(1), unless the authorization is terminated or revoked.  Performed at [facility], [address] [place], [place] [address]   Culture, blood (routine x 2)     Status: None (Preliminary result)   Collection Time: 11/10/21  5:30 AM   Specimen: BLOOD  Result Value Ref Range Status   Specimen Description   Final    BLOOD RIGHT ARM Performed at [facility] [address], [place], [place] [address]    Special Requests   Final    BOTTLES DRAWN AEROBIC ONLY Blood Culture adequate volume Performed at [facility] [address] [place], [place] [address]    Culture   Final    NO GROWTH 3 DAYS Performed at [facility], [place] [address] [place], [place] [address]    Report Status PENDING  Incomplete     Labs: BNP (last 3 results) No results for input(s): BNP in the last 8760 hours. Basic Metabolic Panel: Recent Labs  Lab 11/10/21 0435 11/11/21 1040 11/11/21 1315 11/11/21 1744 11/12/21 0457 11/13/21 0557  NA 136 134*  --  136 137 135  K 4.9 5.9* 5.5* 5.2* 5.5* 4.6  CL 105 103  --  106 106 102  CO2 22 21*  --  19* 23 24  GLUCOSE 113* 100*  --  155* 109* 111*  BUN 40* 63*  --  64* 62* 45*  CREATININE 3.17* 6.24*  --  6.09* 4.67* 2.61*  CALCIUM 8.6* 8.0*  --  8.1* 8.4* 8.6*   Liver Function Tests: No results for input(s): AST, ALT, ALKPHOS, BILITOT, PROT, ALBUMIN in the last 168 hours. No results for input(s): LIPASE, AMYLASE in the last 168 hours. No results for input(s): AMMONIA in the last 168 hours. CBC: Recent Labs  Lab 11/09/21 2243 11/10/21 0435 11/13/21 1254  WBC 17.1* 19.5* 9.6  NEUTROABS 12.8*  --    --   HGB 12.9* 13.4 13.0  HCT 39.8 42.1 40.5  MCV 92.6 94.0 93.1  PLT 439* 514* 403*   Cardiac Enzymes: No results for input(s): CKTOTAL, CKMB, CKMBINDEX, TROPONINI in the last 168 hours. BNP: Invalid input(s): POCBNP CBG: No results for input(s): GLUCAP in the last 168 hours. D-Dimer No results for input(s): DDIMER in the last 72 hours. Hgb A1c No results for input(s): HGBA1C in the last 72 hours. Lipid Profile No results for input(s): CHOL, HDL, LDLCALC, TRIG, CHOLHDL, LDLDIRECT in the last 72 hours. Thyroid function studies No results for input(s): TSH, T4TOTAL, T3FREE, THYROIDAB in the last 72 hours.  Invalid input(s): FREET3 Anemia work up No results for input(s): VITAMINB12, FOLATE, FERRITIN, TIBC, IRON, RETICCTPCT in the last 72 hours. Urinalysis    Component Value Date/Time   COLORURINE YELLOW 11/10/2021 0014   APPEARANCEUR HAZY (A) 11/10/2021 0014   LABSPEC 1.020 11/10/2021 0014   PHURINE 5.5 11/10/2021 0014   GLUCOSEU NEGATIVE 11/10/2021 0014   HGBUR LARGE (A) 11/10/2021 0014   BILIRUBINUR NEGATIVE 11/10/2021 0014   KETONESUR NEGATIVE 11/10/2021 0014   PROTEINUR 100 (A) 11/10/2021 0014   UROBILINOGEN 0.2 08/23/2015 1555   NITRITE NEGATIVE 11/10/2021 0014  LEUKOCYTESUR TRACE (A) 11/10/2021 0014   Sepsis Labs Invalid input(s): PROCALCITONIN,  WBC,  LACTICIDVEN Microbiology Recent Results (from the past 240 hour(s))  Urine Culture     Status: Abnormal   Collection Time: 11/10/21 12:14 AM   Specimen: In/Out Cath Urine  Result Value Ref Range Status   Specimen Description   Final    IN/OUT CATH URINE Performed at [facility], [address] [place],  [address]    Special Requests   Final    NONE Performed at [facility], [address] [place], [place] [address]    Culture 10,000 COLONIES/mL STAPHYLOCOCCUS EPIDERMIDIS (A)  Final   Report Status 11/11/2021 FINAL  Final  Resp Panel by RT-PCR (Flu A&B, Covid) Nasopharyngeal  Swab     Status: None   Collection Time: 11/10/21  1:10 AM   Specimen: Nasopharyngeal Swab; Nasopharyngeal(NP) swabs in vial transport medium  Result Value Ref Range Status   SARS Coronavirus 2 by RT PCR NEGATIVE NEGATIVE Final    Comment: (NOTE) SARS-CoV-2 target nucleic acids are NOT DETECTED.  The SARS-CoV-2 RNA is generally detectable in upper respiratory specimens during the acute phase of infection. The lowest concentration of SARS-CoV-2 viral copies this assay can detect is 138 copies/mL. A negative result does not preclude SARS-Cov-2 infection and should not be used as the sole basis for treatment or other patient management decisions. A negative result may occur with  improper specimen collection/handling, submission of specimen other than nasopharyngeal swab, presence of viral mutation(s) within the areas targeted by this assay, and inadequate number of viral copies(<138 copies/mL). A negative result must be combined with clinical observations, patient history, and epidemiological information. The expected result is Negative.  Fact Sheet for Patients:  [employer]  Fact Sheet for Healthcare Providers:  IncredibleEmployment.be  This test is no t yet approved or cleared by the [place] FDA and  has been authorized for detection and/or diagnosis of SARS-CoV-2 by FDA under an Emergency Use Authorization (EUA). This EUA will remain  in effect (meaning this test can be used) for the duration of the COVID-19 declaration under Section 564(b)(1) of the Act, 21 U.S.C.section 360bbb-3(b)(1), unless the authorization is terminated  or revoked sooner.       Influenza A by PCR NEGATIVE NEGATIVE Final   Influenza B by PCR NEGATIVE NEGATIVE Final    Comment: (NOTE) The Xpert Xpress SARS-CoV-2/FLU/RSV plus assay is intended as an aid in the diagnosis of influenza from Nasopharyngeal swab specimens and should not be used as a sole  basis for treatment. Nasal washings and aspirates are unacceptable for Xpert Xpress SARS-CoV-2/FLU/RSV testing.  Fact Sheet for Patients: EntrepreneurPulse.com.au  Fact Sheet for Healthcare Providers: IncredibleEmployment.be  This test is not yet approved or cleared by the [place] FDA and has been authorized for detection and/or diagnosis of SARS-CoV-2 by FDA under an Emergency Use Authorization (EUA). This EUA will remain in effect (meaning this test can be used) for the duration of the COVID-19 declaration under Section 564(b)(1) of the Act, 21 U.S.C. section 360bbb-3(b)(1), unless the authorization is terminated or revoked.  Performed at [facility], [address] [place], [place] [address]   Culture, blood (routine x 2)     Status: None (Preliminary result)   Collection Time: 11/10/21  5:30 AM   Specimen: BLOOD  Result Value Ref Range Status   Specimen Description   Final    BLOOD RIGHT ARM Performed at [facility]  [place] [address] [place], [place] [address]    Special Requests   Final    BOTTLES DRAWN AEROBIC ONLY Blood Culture adequate volume Performed at [place] [address] [place], [place] [address]    Culture   Final    NO GROWTH 3 DAYS Performed at [facility], [place] [address] [place], [place] [address]    Report Status PENDING  Incomplete     Time coordinating discharge: 35 minutes  SIGNED:   Cordelia Poche, MD [facility] 11/13/2021, 1:46 PM

## 2021-11-13 NOTE — Progress Notes (Signed)
Patient discharged to home with wife, discharge instructions reviewed with patient who verbalized understanding. Instructed wife in the flushing of patients nephrostomy drains and the care, cleaning and dressing changes,

## 2021-11-13 NOTE — Progress Notes (Addendum)
2 Days Post-Op Subjective: Denies flank pain, fevers, nausea, emesis. Tolerating bilateral nephrostomy tubes.  Objective: Vital signs in last 24 hours: Temp:  [97.6 F (36.4 C)-97.8 F (36.6 C)] 97.8 F (36.6 C) (11/25 2103) Pulse Rate:  [70-74] 74 (11/25 2103) Resp:  [16-19] 19 (11/25 2103) BP: (147)/(91-93) 147/91 (11/25 2103) SpO2:  [98 %-100 %] 98 % (11/25 2103)  Intake/Output from previous day: 11/25 0701 - 11/26 0700 In: 1198.3 [P.O.:960; I.V.:38.3; IV Piggyback:200] Out: 2305 [Urine:2305] Intake/Output this shift: No intake/output data recorded.  Physical Exam:  General: Alert and oriented CV: RRR Lungs: Clear Abdomen: Soft, ND, NT Ext: NT, No erythema  Lab Results: No results for input(s): HGB, HCT in the last 72 hours. BMET Recent Labs    11/12/21 0457 11/13/21 0557  NA 137 135  K 5.5* 4.6  CL 106 102  CO2 23 24  GLUCOSE 109* 111*  BUN 62* 45*  CREATININE 4.67* 2.61*  CALCIUM 8.4* 8.6*     Studies/Results: IR NEPHROSTOMY PLACEMENT LEFT  Result Date: 11/11/2021 INDICATION: 80 year old gentleman with history of metastatic pancreatic malignancy developed bilateral hydronephrosis. Retrograde left ureteral stent was placed, however patient return with worsening renal function and bilateral hydronephrosis. IR consulted for bilateral nephrostomy drain placement. EXAM: Ultrasound-guided bilateral nephrostomy drain placement COMPARISON:  None. MEDICATIONS: Patient currently on inpatient regiment of IV Rocephin. ANESTHESIA/SEDATION: Fentanyl 125 mcg IV; Versed 2.5 mg IV Moderate Sedation Time:  20 minutes The patient was continuously monitored during the procedure by the interventional radiology nurse under my direct supervision. CONTRAST:  20 mL of Omnipaque 300-administered into the collecting system(s) FLUOROSCOPY TIME:  Fluoroscopy Time: 2 minutes 6 seconds (6 mGy). COMPLICATIONS: None immediate. PROCEDURE: Informed written consent was obtained from the patient  after a thorough discussion of the procedural risks, benefits and alternatives. All questions were addressed. Maximal Sterile Barrier Technique was utilized including caps, mask, sterile gowns, sterile gloves, sterile drape, hand hygiene and skin antiseptic. A timeout was performed prior to the initiation of the procedure. Patient position prone on the procedure table. Bilateral flank skin prepped and draped usual fashion. Sterile ultrasound probe cover and gel utilized throughout the procedure. Following local lidocaine administration, 21 gauge needle inserted into the lower pole calyx of the left kidney using continuous ultrasound guidance. 21 gauge needle exchanged for transitional dilator set over 0.018 inch guidewire. Contrast administered through the present dilator set confirmed appropriate positioning within the renal collecting system. Transitional dilator set exchanged for 10 French multipurpose pigtail drain over 0.035 inch guidewire. Contrast administered through the drain under fluoroscopy confirmed appropriate positioning within the renal collecting system. Following the IV administration of 21 gauge needle inserted into the midpole calyx of the right kidney using continuous ultrasound guidance. 20 gauge needle exchanged for a transitional dilator set over 0.018 inch guidewire. Positional dilator set exchanged for 10 [place] multipurpose pigtail drain over 0.035 inch guidewire. Contrast administered through the drain under fluoroscopy confirmed appropriate positioning within the renal collecting system. Both drains were secured to skin with suture and connected to bag. IMPRESSION: Bilateral 10 French nephrostomy drain placement as above. Electronically Signed   By: Miachel Roux M.D.   On: 11/11/2021 15:03   IR NEPHROSTOMY PLACEMENT RIGHT  Result Date: 11/11/2021 INDICATION: 80 year old gentleman with history of metastatic pancreatic malignancy developed bilateral hydronephrosis. Retrograde left  ureteral stent was placed, however patient return with worsening renal function and bilateral hydronephrosis. IR consulted for bilateral nephrostomy drain placement. EXAM: Ultrasound-guided bilateral nephrostomy drain placement COMPARISON:  None.  MEDICATIONS: Patient currently on inpatient regiment of IV Rocephin. ANESTHESIA/SEDATION: Fentanyl 125 mcg IV; Versed 2.5 mg IV Moderate Sedation Time:  20 minutes The patient was continuously monitored during the procedure by the interventional radiology nurse under my direct supervision. CONTRAST:  20 mL of Omnipaque 300-administered into the collecting system(s) FLUOROSCOPY TIME:  Fluoroscopy Time: 2 minutes 6 seconds (6 mGy). COMPLICATIONS: None immediate. PROCEDURE: Informed written consent was obtained from the patient after a thorough discussion of the procedural risks, benefits and alternatives. All questions were addressed. Maximal Sterile Barrier Technique was utilized including caps, mask, sterile gowns, sterile gloves, sterile drape, hand hygiene and skin antiseptic. A timeout was performed prior to the initiation of the procedure. Patient position prone on the procedure table. Bilateral flank skin prepped and draped usual fashion. Sterile ultrasound probe cover and gel utilized throughout the procedure. Following local lidocaine administration, 21 gauge needle inserted into the lower pole calyx of the left kidney using continuous ultrasound guidance. 21 gauge needle exchanged for transitional dilator set over 0.018 inch guidewire. Contrast administered through the present dilator set confirmed appropriate positioning within the renal collecting system. Transitional dilator set exchanged for 10 French multipurpose pigtail drain over 0.035 inch guidewire. Contrast administered through the drain under fluoroscopy confirmed appropriate positioning within the renal collecting system. Following the IV administration of 21 gauge needle inserted into the midpole calyx  of the right kidney using continuous ultrasound guidance. 20 gauge needle exchanged for a transitional dilator set over 0.018 inch guidewire. Positional dilator set exchanged for 10 [place] multipurpose pigtail drain over 0.035 inch guidewire. Contrast administered through the drain under fluoroscopy confirmed appropriate positioning within the renal collecting system. Both drains were secured to skin with suture and connected to bag. IMPRESSION: Bilateral 10 French nephrostomy drain placement as above. Electronically Signed   By: Miachel Roux M.D.   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 Continue to trend creatinine. -Foley removed yesterday as had no urine output from foley -B/l PCNS with excellent urine output -Consider outpatient lasix renogram to evaluate function of right kidney and bilateral ureteroscopy outpatient to evaluate for obstruction -Ucx 10K staph epidermidis. Repeat Ucx pending. Ok to send home on culture specific antibiotics for 1 week. -Ok to discharge from urology standpoint with outpatient followup.   LOS: 2 days   Matt R. Peyson Delao MD 11/13/2021, 8:39 AM [facility]  Pager: [phone]

## 2021-11-13 NOTE — Discharge Instructions (Signed)
Kevin Villegas,  You were in the hospital with a kidney infection and kidney failure. Thankfully you have improved with antibiotics and placement of a tube into your kidney (nephrostomy tube). Please continue antibiotics on discharge and follow-up with your PCP and urologist.

## 2021-11-13 NOTE — Progress Notes (Signed)
Referring Physician(s): Pace,M  Supervising Physician: Dr. Pascal Lux  Patient Status:  [facility] - In-pt  Chief Complaint:  Right flank pain, bilateral hydronephrosis, metastatic pancreatic cancer  Subjective: Pt c/o some mild rt flank discomfort; denies fever, chills, N/V  Anxious to go home today   Allergies: Pollen extract  Medications:  Current Facility-Administered Medications:    0.9 %  sodium chloride infusion, , Intravenous, PRN, Mariel Aloe, MD, Last Rate: 10 mL/hr at 11/13/21 0129, New Bag at 11/13/21 0129   acetaminophen (TYLENOL) tablet 650 mg, 650 mg, Oral, Q6H PRN, 650 mg at 11/12/21 1655 **OR** acetaminophen (TYLENOL) suppository 650 mg, 650 mg, Rectal, Q6H PRN, Chotiner, Yevonne Aline, MD   amLODipine (NORVASC) tablet 5 mg, 5 mg, Oral, Daily, Chotiner, Yevonne Aline, MD, 5 mg at 11/13/21 1012   cefTRIAXone (ROCEPHIN) 1 g in sodium chloride 0.9 % 100 mL IVPB, 1 g, Intravenous, Q24H, Chotiner, Yevonne Aline, MD, Last Rate: 200 mL/hr at 11/13/21 0130, 1 g at 11/13/21 0130   Chlorhexidine Gluconate Cloth 2 % PADS 6 each, 6 each, Topical, Daily, Mariel Aloe, MD   heparin injection 5,000 Units, 5,000 Units, Subcutaneous, Q8H, Swayne, Mary M, RPH   HYDROmorphone (DILAUDID) injection 1 mg, 1 mg, Intravenous, Q3H PRN, Chotiner, Yevonne Aline, MD   ondansetron (ZOFRAN) tablet 4 mg, 4 mg, Oral, Q6H PRN **OR** ondansetron (ZOFRAN) injection 4 mg, 4 mg, Intravenous, Q6H PRN, Chotiner, Yevonne Aline, MD, 4 mg at 11/10/21 1220   oxyCODONE (Oxy IR/ROXICODONE) immediate release tablet 5 mg, 5 mg, Oral, Q4H PRN, Mariel Aloe, MD, 5 mg at 11/11/21 1631   polyethylene glycol (MIRALAX / GLYCOLAX) packet 17 g, 17 g, Oral, BID, Mariel Aloe, MD, 17 g at 11/12/21 2115   sodium chloride flush (NS) 0.9 % injection 10-40 mL, 10-40 mL, Intracatheter, Q12H, Mariel Aloe, MD, 10 mL at 11/12/21 2116   sodium chloride flush (NS) 0.9 % injection 10-40 mL, 10-40 mL, Intracatheter, PRN, Mariel Aloe, MD    sodium chloride flush (NS) 0.9 % injection 5 mL, 5 mL, Intracatheter, Q8H, Mir, Paula Libra, MD, 5 mL at 11/13/21 0608   tamsulosin (FLOMAX) capsule 0.4 mg, 0.4 mg, Oral, QPC supper, Festus Aloe, MD, 0.4 mg at 11/12/21 1655    Vital Signs: BP (!) 147/91   Pulse 74   Temp 97.8 F (36.6 C) (Oral)   Resp 19   Ht 5' 8"  (1.727 m)   Wt 63.5 kg   SpO2 98%   BMI 21.29 kg/m   Physical Exam awake/alert; both rt and left PCN's intact, OP from left about 2.7 liters yellow urine since placed yesterday, OP from right about 125 cc blood-tinged fluid; both caths flushed without resistance; some soreness at rt PCN insertion site  Imaging: CT Renal Stone Study  Result Date: 11/09/2021 CLINICAL DATA:  Flank pain, kidney stone suspected recent stent in left kidney. need to evaluate placement EXAM: CT ABDOMEN AND PELVIS WITHOUT CONTRAST TECHNIQUE: Multidetector CT imaging of the abdomen and pelvis was performed following the standard protocol without IV contrast. COMPARISON:  CT 10/26/2021.  Renal ultrasound 10/27/2021 FINDINGS: Lower chest: Multiple bilateral basilar pulmonary nodules. Cavitary nodule in the left lower lobe. Many of these nodules were not previously included in the field of view. There is increased size of a right lower lobe nodule currently measuring 12 by 6 mm, series 3, image 4, previously 11 x 5 mm. Small left pleural effusion is similar. Hepatobiliary: Stable fluid density lesions in  the liver consistent with cysts. Some of the smaller low-density lesions are too small to characterize. No new liver lesion. Cholecystectomy. No biliary dilatation. Pancreas: Soft tissue density in the region of the pancreatic body adjacent to duodenal stent measures approximately 3 x 3.8 cm using same measurement technique, previously 4.1 x 3.5 cm. The remainder the pancreas is difficult to delineate. Spleen: Surgically absent. Adrenals/Urinary Tract: No adrenal nodule. Left nephroureteral stent in place  with proximal pigtail in the renal pelvis and distal pigtail in the bladder. Despite appropriate stent placement there is persistent moderate left hydroureteronephrosis. Left perinephric edema has slightly increased. Small focus of air in the left renal collecting system is likely related to stent placement. No stone or stone fragments along the course of the stent or in the bladder. Urinary bladder is partially distended. Chronic moderate right hydronephrosis with renal parenchymal atrophy. Unchanged right renal cyst. Stable 6 mm stone in the distal right ureter, series 2, image 60. Stomach/Bowel: Bowel assessment is limited in the absence of enteric contrast and paucity of intra-abdominal fat. Postsurgical change of the stomach. Duodenal stent is unchanged in position. Proximal small bowel is nondilated. Mid small bowel is prominent with fecalization of small bowel contents. More distal small bowel is decompressed. There is no mesenteric inflammation or swirling of mesenteric vasculature. Multifocal colonic diverticulosis. No acute diverticulitis. Moderate colonic stool burden. Descending colostomy without peristomal hernia. Stapled off sigmoid colon in the pelvis. Vascular/Lymphatic: Mild aortic atherosclerosis. No aortic aneurysm. Limited assessment for adenopathy. No definite bulky enlarged abdominopelvic lymph nodes. Reproductive: Prominent prostate.  Left inguinal testis. Other: Trace free fluid in the pelvis. No free air. Postsurgical change of the abdominal wall. Tiny fat containing umbilical hernia. Musculoskeletal: Degenerative change throughout the spine and both hips. No acute osseous findings. IMPRESSION: 1. Left nephroureteral stent in place with persistent moderate left hydroureteronephrosis. Left perinephric edema has slightly increased. Small focus of air in the left renal collecting system is likely related to stent placement. No stone or stone fragments along the course of the stent or in the  bladder. 2. Chronic moderate right hydronephrosis with renal parenchymal atrophy. Stable 6 mm stone in the distal right ureter. 3. Soft tissue density in the region of the pancreatic body adjacent to duodenal stent is unchanged in size from prior CT. 4. Multiple bilateral basilar pulmonary nodules consistent with pulmonary metastasis. Small left pleural effusion is similar. 5. Colonic diverticulosis without diverticulitis. 6. Fecalization of small bowel contents with slight increased diameter of distal small bowel suggests slow transit. No bowel obstruction. Aortic Atherosclerosis (ICD10-I70.0). Electronically Signed   By: Keith Rake M.D.   On: 11/09/2021 22:33   IR NEPHROSTOMY PLACEMENT LEFT  Result Date: 11/11/2021 INDICATION: 80 year old gentleman with history of metastatic pancreatic malignancy developed bilateral hydronephrosis. Retrograde left ureteral stent was placed, however patient return with worsening renal function and bilateral hydronephrosis. IR consulted for bilateral nephrostomy drain placement. EXAM: Ultrasound-guided bilateral nephrostomy drain placement COMPARISON:  None. MEDICATIONS: Patient currently on inpatient regiment of IV Rocephin. ANESTHESIA/SEDATION: Fentanyl 125 mcg IV; Versed 2.5 mg IV Moderate Sedation Time:  20 minutes The patient was continuously monitored during the procedure by the interventional radiology nurse under my direct supervision. CONTRAST:  20 mL of Omnipaque 300-administered into the collecting system(s) FLUOROSCOPY TIME:  Fluoroscopy Time: 2 minutes 6 seconds (6 mGy). COMPLICATIONS: None immediate. PROCEDURE: Informed written consent was obtained from the patient after a thorough discussion of the procedural risks, benefits and alternatives. All questions were addressed. Maximal Sterile  Barrier Technique was utilized including caps, mask, sterile gowns, sterile gloves, sterile drape, hand hygiene and skin antiseptic. A timeout was performed prior to the  initiation of the procedure. Patient position prone on the procedure table. Bilateral flank skin prepped and draped usual fashion. Sterile ultrasound probe cover and gel utilized throughout the procedure. Following local lidocaine administration, 21 gauge needle inserted into the lower pole calyx of the left kidney using continuous ultrasound guidance. 21 gauge needle exchanged for transitional dilator set over 0.018 inch guidewire. Contrast administered through the present dilator set confirmed appropriate positioning within the renal collecting system. Transitional dilator set exchanged for 10 French multipurpose pigtail drain over 0.035 inch guidewire. Contrast administered through the drain under fluoroscopy confirmed appropriate positioning within the renal collecting system. Following the IV administration of 21 gauge needle inserted into the midpole calyx of the right kidney using continuous ultrasound guidance. 20 gauge needle exchanged for a transitional dilator set over 0.018 inch guidewire. Positional dilator set exchanged for 10 [place] multipurpose pigtail drain over 0.035 inch guidewire. Contrast administered through the drain under fluoroscopy confirmed appropriate positioning within the renal collecting system. Both drains were secured to skin with suture and connected to bag. IMPRESSION: Bilateral 10 French nephrostomy drain placement as above. Electronically Signed   By: Miachel Roux M.D.   On: 11/11/2021 15:03   IR NEPHROSTOMY PLACEMENT RIGHT  Result Date: 11/11/2021 INDICATION: 80 year old gentleman with history of metastatic pancreatic malignancy developed bilateral hydronephrosis. Retrograde left ureteral stent was placed, however patient return with worsening renal function and bilateral hydronephrosis. IR consulted for bilateral nephrostomy drain placement. EXAM: Ultrasound-guided bilateral nephrostomy drain placement COMPARISON:  None. MEDICATIONS: Patient currently on inpatient regiment  of IV Rocephin. ANESTHESIA/SEDATION: Fentanyl 125 mcg IV; Versed 2.5 mg IV Moderate Sedation Time:  20 minutes The patient was continuously monitored during the procedure by the interventional radiology nurse under my direct supervision. CONTRAST:  20 mL of Omnipaque 300-administered into the collecting system(s) FLUOROSCOPY TIME:  Fluoroscopy Time: 2 minutes 6 seconds (6 mGy). COMPLICATIONS: None immediate. PROCEDURE: Informed written consent was obtained from the patient after a thorough discussion of the procedural risks, benefits and alternatives. All questions were addressed. Maximal Sterile Barrier Technique was utilized including caps, mask, sterile gowns, sterile gloves, sterile drape, hand hygiene and skin antiseptic. A timeout was performed prior to the initiation of the procedure. Patient position prone on the procedure table. Bilateral flank skin prepped and draped usual fashion. Sterile ultrasound probe cover and gel utilized throughout the procedure. Following local lidocaine administration, 21 gauge needle inserted into the lower pole calyx of the left kidney using continuous ultrasound guidance. 21 gauge needle exchanged for transitional dilator set over 0.018 inch guidewire. Contrast administered through the present dilator set confirmed appropriate positioning within the renal collecting system. Transitional dilator set exchanged for 10 French multipurpose pigtail drain over 0.035 inch guidewire. Contrast administered through the drain under fluoroscopy confirmed appropriate positioning within the renal collecting system. Following the IV administration of 21 gauge needle inserted into the midpole calyx of the right kidney using continuous ultrasound guidance. 20 gauge needle exchanged for a transitional dilator set over 0.018 inch guidewire. Positional dilator set exchanged for 10 [place] multipurpose pigtail drain over 0.035 inch guidewire. Contrast administered through the drain under fluoroscopy  confirmed appropriate positioning within the renal collecting system. Both drains were secured to skin with suture and connected to bag. IMPRESSION: Bilateral 10 French nephrostomy drain placement as above. Electronically Signed   By: Sharen Heck  Mir M.D.   On: 11/11/2021 15:03    Labs:  CBC: Recent Labs    10/27/21 1647 10/28/21 0351 11/09/21 2243 11/10/21 0435  WBC 9.2 10.7* 17.1* 19.5*  HGB 12.6* 13.5 12.9* 13.4  HCT 38.1* 40.5 39.8 42.1  PLT 529* 591* 439* 514*     COAGS: Recent Labs    06/02/21 0407 06/11/21 1907 11/11/21 1215  INR 1.1 1.3* 1.2  APTT  --  26  --      BMP: Recent Labs    11/11/21 1040 11/11/21 1315 11/11/21 1744 11/12/21 0457 11/13/21 0557  NA 134*  --  136 137 135  K 5.9* 5.5* 5.2* 5.5* 4.6  CL 103  --  106 106 102  CO2 21*  --  19* 23 24  GLUCOSE 100*  --  155* 109* 111*  BUN 63*  --  64* 62* 45*  CALCIUM 8.0*  --  8.1* 8.4* 8.6*  CREATININE 6.24*  --  6.09* 4.67* 2.61*  GFRNONAA 9*  --  9* 12* 24*     LIVER FUNCTION TESTS: Recent Labs    07/05/21 1201 09/10/21 1442 10/13/21 1530 10/27/21 1647  BILITOT 0.3 0.3 <0.2 0.3  AST 45* 23 25 13*  ALT 46* 13 19 16   ALKPHOS 69 58 74 50  PROT 7.5 7.5 7.4 7.0  ALBUMIN 3.7 4.0 4.3 3.1*     Assessment and Plan: Pt with hx met panc ca, SBO s/p diverting colostomy, CKD stage IIIa,right renal atrophy with right distal obstruction, 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   MRN: [id number]

## 2021-11-14 LAB — URINE CULTURE

## 2021-11-15 ENCOUNTER — Telehealth: Payer: Self-pay | Admitting: Family Medicine

## 2021-11-15 LAB — CULTURE, BLOOD (ROUTINE X 2)
Culture: NO GROWTH
Special Requests: ADEQUATE

## 2021-11-15 NOTE — Telephone Encounter (Signed)
Pt has been informed that he needs to be accessed in the office. Appt has been scheduled for 10/30 at 9:25.

## 2021-11-15 NOTE — Telephone Encounter (Signed)
Please schedule an appointment ASAP for him, he needs hospital follow-up and we can recheck his kidney function ASAP

## 2021-11-15 NOTE — Telephone Encounter (Signed)
Patient would like to speak with Dr. Warrick Parisian regarding his hospital visit over the weekend at [facility] for his kidneys. He is concerned about his urine output. Please call.

## 2021-11-16 ENCOUNTER — Encounter: Payer: Self-pay | Admitting: *Deleted

## 2021-11-16 NOTE — Progress Notes (Signed)
Received a call from the patient requesting an appointment to see Dr Marin Olp. Spoke to Dr Marin Olp and he would like patient seen this week. Message sent to scheduling.   Per Dr Marin Olp, [facility] testing requested on specimen [id number] DOS 11/05/2021.   Oncology Nurse Navigator Documentation  Oncology Nurse Navigator Flowsheets 11/16/2021  Navigator Follow Up Date: 11/18/2021  Navigator Follow Up Reason: Follow-up Appointment  Navigator Location [facility]  Navigator Encounter Type Molecular Studies;Appt/Treatment Plan Review  Telephone Appt Confirmation/Clarification;Incoming Call  Patient Visit Type MedOnc  Treatment Phase Active Tx  Barriers/Navigation Needs Coordination of Care;Education  Education -  Interventions Coordination of Care;Psycho-Social Support  Acuity Level 2-Minimal Needs (1-2 Barriers Identified)  Coordination of Care Appts;Pathology  Education Method Verbal  Support Groups/Services Friends and Family  Time Spent with Patient 45

## 2021-11-17 ENCOUNTER — Other Ambulatory Visit: Payer: Self-pay | Admitting: *Deleted

## 2021-11-17 ENCOUNTER — Encounter: Payer: Self-pay | Admitting: *Deleted

## 2021-11-17 ENCOUNTER — Ambulatory Visit: Payer: Medicare Other | Admitting: Family Medicine

## 2021-11-17 DIAGNOSIS — Z95828 Presence of other vascular implants and grafts: Secondary | ICD-10-CM

## 2021-11-17 DIAGNOSIS — C252 Malignant neoplasm of tail of pancreas: Secondary | ICD-10-CM

## 2021-11-17 NOTE — Progress Notes (Signed)
Patient called asking if his lab appointment could be cancelled as he doesn't like his port accessed too frequently, and he just had labs drawn last week during his hospitalization.   Spoke to Dr Marin Olp and he needs labs drawn tomorrow. Notified patient of Dr Antonieta Pert response and offered that blood could be drawn peripherally. He understood response, but declines peripheral draw. He will have the labs drawn via the port.   Oncology Nurse Navigator Documentation  Oncology Nurse Navigator Flowsheets 11/17/2021  Navigator Follow Up Date: 11/18/2021  Navigator Follow Up Reason: Follow-up Appointment  Navigator Location [facility]  Navigator Encounter Type Telephone  Telephone Incoming Call  Patient Visit Type MedOnc  Treatment Phase Active Tx  Barriers/Navigation Needs Coordination of Care;Education  Education Other  Interventions Education;Psycho-Social Support  Acuity Level 2-Minimal Needs (1-2 Barriers Identified)  Coordination of Care -  Education Method Verbal  Support Groups/Services Friends and Family  Time Spent with Patient 15

## 2021-11-18 ENCOUNTER — Other Ambulatory Visit: Payer: Self-pay

## 2021-11-18 ENCOUNTER — Inpatient Hospital Stay: Payer: Medicare Other | Attending: Hematology & Oncology

## 2021-11-18 ENCOUNTER — Inpatient Hospital Stay: Payer: Medicare Other

## 2021-11-18 ENCOUNTER — Encounter: Payer: Self-pay | Admitting: *Deleted

## 2021-11-18 ENCOUNTER — Inpatient Hospital Stay (HOSPITAL_BASED_OUTPATIENT_CLINIC_OR_DEPARTMENT_OTHER): Payer: Medicare Other | Admitting: Hematology & Oncology

## 2021-11-18 ENCOUNTER — Telehealth: Payer: Self-pay | Admitting: Family Medicine

## 2021-11-18 ENCOUNTER — Encounter: Payer: Self-pay | Admitting: Hematology & Oncology

## 2021-11-18 VITALS — BP 152/98 | HR 77 | Temp 97.9°F | Resp 17 | Wt 146.0 lb

## 2021-11-18 DIAGNOSIS — C259 Malignant neoplasm of pancreas, unspecified: Secondary | ICD-10-CM | POA: Diagnosis not present

## 2021-11-18 DIAGNOSIS — R918 Other nonspecific abnormal finding of lung field: Secondary | ICD-10-CM | POA: Insufficient documentation

## 2021-11-18 DIAGNOSIS — C252 Malignant neoplasm of tail of pancreas: Secondary | ICD-10-CM

## 2021-11-18 DIAGNOSIS — N1330 Unspecified hydronephrosis: Secondary | ICD-10-CM | POA: Insufficient documentation

## 2021-11-18 DIAGNOSIS — Z95828 Presence of other vascular implants and grafts: Secondary | ICD-10-CM

## 2021-11-18 DIAGNOSIS — Z933 Colostomy status: Secondary | ICD-10-CM | POA: Insufficient documentation

## 2021-11-18 DIAGNOSIS — N289 Disorder of kidney and ureter, unspecified: Secondary | ICD-10-CM

## 2021-11-18 DIAGNOSIS — Z936 Other artificial openings of urinary tract status: Secondary | ICD-10-CM | POA: Diagnosis not present

## 2021-11-18 DIAGNOSIS — C7911 Secondary malignant neoplasm of bladder: Secondary | ICD-10-CM | POA: Insufficient documentation

## 2021-11-18 LAB — CBC WITH DIFFERENTIAL (CANCER CENTER ONLY)
Abs Immature Granulocytes: 0.03 10*3/uL (ref 0.00–0.07)
Basophils Absolute: 0.1 10*3/uL (ref 0.0–0.1)
Basophils Relative: 1 %
Eosinophils Absolute: 0.2 10*3/uL (ref 0.0–0.5)
Eosinophils Relative: 2 %
HCT: 39 % (ref 39.0–52.0)
Hemoglobin: 12.5 g/dL — ABNORMAL LOW (ref 13.0–17.0)
Immature Granulocytes: 0 %
Lymphocytes Relative: 15 %
Lymphs Abs: 1.4 10*3/uL (ref 0.7–4.0)
MCH: 29.6 pg (ref 26.0–34.0)
MCHC: 32.1 g/dL (ref 30.0–36.0)
MCV: 92.2 fL (ref 80.0–100.0)
Monocytes Absolute: 0.9 10*3/uL (ref 0.1–1.0)
Monocytes Relative: 9 %
Neutro Abs: 7 10*3/uL (ref 1.7–7.7)
Neutrophils Relative %: 73 %
Platelet Count: 571 10*3/uL — ABNORMAL HIGH (ref 150–400)
RBC: 4.23 MIL/uL (ref 4.22–5.81)
RDW: 14 % (ref 11.5–15.5)
WBC Count: 9.6 10*3/uL (ref 4.0–10.5)
nRBC: 0 % (ref 0.0–0.2)

## 2021-11-18 LAB — CMP (CANCER CENTER ONLY)
ALT: 45 U/L — ABNORMAL HIGH (ref 0–44)
AST: 44 U/L — ABNORMAL HIGH (ref 15–41)
Albumin: 3.8 g/dL (ref 3.5–5.0)
Alkaline Phosphatase: 69 U/L (ref 38–126)
Anion gap: 10 (ref 5–15)
BUN: 26 mg/dL — ABNORMAL HIGH (ref 8–23)
CO2: 24 mmol/L (ref 22–32)
Calcium: 9.5 mg/dL (ref 8.9–10.3)
Chloride: 102 mmol/L (ref 98–111)
Creatinine: 1.58 mg/dL — ABNORMAL HIGH (ref 0.61–1.24)
GFR, Estimated: 44 mL/min — ABNORMAL LOW (ref >60)
Glucose, Bld: 82 mg/dL (ref 70–99)
Potassium: 4.3 mmol/L (ref 3.5–5.1)
Sodium: 136 mmol/L (ref 135–145)
Total Bilirubin: 0.3 mg/dL (ref 0.3–1.2)
Total Protein: 7.2 g/dL (ref 6.5–8.1)

## 2021-11-18 LAB — PREALBUMIN: Prealbumin: 21.3 mg/dL (ref 18–38)

## 2021-11-18 LAB — LACTATE DEHYDROGENASE: LDH: 172 U/L (ref 98–192)

## 2021-11-18 MED ORDER — HEPARIN SOD (PORK) LOCK FLUSH 100 UNIT/ML IV SOLN
500.0000 [IU] | Freq: Once | INTRAVENOUS | Status: AC
  Administered 2021-11-18: 500 [IU] via INTRAVENOUS

## 2021-11-18 MED ORDER — HEPARIN SOD (PORK) LOCK FLUSH 100 UNIT/ML IV SOLN: 500.0000 [IU] | Freq: Once | INTRAVENOUS | Status: DC

## 2021-11-18 MED ORDER — SODIUM CHLORIDE 0.9% FLUSH: 10.0000 mL | INTRAVENOUS | Status: DC | PRN

## 2021-11-18 MED ORDER — SODIUM CHLORIDE 0.9% FLUSH
10.0000 mL | Freq: Once | INTRAVENOUS | Status: AC
  Administered 2021-11-18: 10 mL via INTRAVENOUS

## 2021-11-18 NOTE — Progress Notes (Signed)
Hematology and Oncology Follow Up Visit  Kevin Villegas [id number] 1941/10/02 80 y.o. 11/18/2021   Principle Diagnosis:  Metastatic pancreatic adenocarcinoma  Current Therapy:   Observation     Interim History:  Kevin Villegas is back for follow-up.  Unfortunately, he was recently in the hospital.  He had renal failure.  He ultimately required to have nephrostomy tubes placed by Interventional Radiology.  So far, the LEFT nephrostomy tube is working well.  The RIGHT is not.  He underwent a cystoscopy.  Apparently, this was as an outpatient.  This was done on 11/05/2021.  He did have biopsies that were done in the bladder trigone.  Surprisingly, the pathology report ([id number]) showed adenocarcinoma consistent with pancreatic cancer.  He was discharged home after couple days.  His renal function has improved quite nicely.  He has seen urology.  They really are not able to do much for him becauseInterventional Radiology put in the nephrostomy tubes.  He is having some urine through the urethra.  He has had no problems with cough or shortness of breath.  He did have a CT scan done while he was in the hospital.  This was done on 11/11/2021.  The pancreatic body mass actually looked better in 3 x 3 point centimeters.  There was some moderate left hydronephrosis.  There is no mention of any obvious mass.  The right kidney has some hydronephrosis.  This is chronic.  He does have the pulmonary nodules.  Everything looks to be stable for I can tell on the scan.    His last CA 9-9, which was back in the summertime was normal and 5.  He is eating okay.  He is having no problems with nausea or vomiting.  Has a colostomy.  This seems to be working well.  Overall, I would have to say his performance status is probably   Medications:  Current Outpatient Medications:    amLODipine (NORVASC) 5 MG tablet, Take 1 tablet (5 mg total) by mouth daily., Disp: 30 tablet, Rfl: 0   cefdinir (OMNICEF) 300 MG  capsule, Take 1 capsule (300 mg total) by mouth daily for 5 days., Disp: 5 capsule, Rfl: 0   oxymetazoline (AFRIN) 0.05 % nasal spray, Place 1 spray into both nostrils 2 (two) times daily as needed for congestion., Disp: , Rfl:    polyethylene glycol (MIRALAX / GLYCOLAX) 17 g packet, Take 17 g by mouth daily., Disp: , Rfl:    tamsulosin (FLOMAX) 0.4 MG CAPS capsule, Take 1 capsule (0.4 mg total) by mouth daily after supper., Disp: 30 capsule, Rfl: 0  Allergies:  Allergies  Allergen Reactions   Pollen Extract Other (See Comments)    Sinus congestion    Past Medical History, Surgical history, Social history, and Family History were reviewed and updated.  Review of Systems: Review of Systems  Constitutional: Negative.   HENT:  Negative.    Eyes: Negative.   Respiratory: Negative.    Cardiovascular: Negative.   Gastrointestinal: Negative.   Endocrine: Negative.   Genitourinary: Negative.    Musculoskeletal: Negative.   Neurological: Negative.   Hematological: Negative.   Psychiatric/Behavioral: Negative.     Physical Exam:  weight is 146 lb (66.2 kg). His oral temperature is 97.9 F (36.6 C). His blood pressure is 152/98 (abnormal) and his pulse is 77. His respiration is 17 and oxygen saturation is 98%.   Wt Readings from Last 3 Encounters:  11/18/21 146 lb (66.2 kg)  11/09/21 140 lb (63.5 kg)  10/30/21  142 lb 10.2 oz (64.7 kg)    Physical Exam Vitals reviewed.  HENT:     Head: Normocephalic and atraumatic.  Eyes:     Pupils: Pupils are equal, round, and reactive to light.  Cardiovascular:     Rate and Rhythm: Normal rate and regular rhythm.     Heart sounds: Normal heart sounds.  Pulmonary:     Effort: Pulmonary effort is normal.     Breath sounds: Normal breath sounds.  Abdominal:     General: Bowel sounds are normal.     Palpations: Abdomen is soft.     Comments: His abdomen is soft.  He has a colostomy in the lower abdomen.  The colostomy is intact.  There is no  fluid wave.  There is no guarding or rebound tenderness.  He has no abdominal mass.  There is no palpable liver or spleen tip.  Musculoskeletal:        General: No tenderness or deformity. Normal range of motion.     Cervical back: Normal range of motion.  Lymphadenopathy:     Cervical: No cervical adenopathy.  Skin:    General: Skin is warm and dry.     Findings: No erythema or rash.  Neurological:     Mental Status: He is alert and oriented to person, place, and time.  Psychiatric:        Behavior: Behavior normal.        Thought Content: Thought content normal.        Judgment: Judgment normal.     Lab Results  Component Value Date   WBC 9.6 11/18/2021   HGB 12.5 (L) 11/18/2021   HCT 39.0 11/18/2021   MCV 92.2 11/18/2021   PLT 571 (H) 11/18/2021     Chemistry      Component Value Date/Time   NA 136 11/18/2021 1149   NA 139 10/13/2021 1530   K 4.3 11/18/2021 1149   CL 102 11/18/2021 1149   CO2 24 11/18/2021 1149   BUN 26 (H) 11/18/2021 1149   BUN 25 10/13/2021 1530   CREATININE 1.58 (H) 11/18/2021 1149      Component Value Date/Time   CALCIUM 9.5 11/18/2021 1149   ALKPHOS 69 11/18/2021 1149   AST 44 (H) 11/18/2021 1149   ALT 45 (H) 11/18/2021 1149   BILITOT 0.3 11/18/2021 1149      Impression and Plan: Kevin Villegas is a very nice 80 year old white male.  He has metastatic pancreatic cancer.  He has had a lot of problems with bowel obstruction.  He finally had surgery.  Has a colostomy.  From what he says, the surgeon wants to eventually reverse the colostomy.  Now we are dealing with the bladder.  Again the biopsy was done as an outpatient.  This was done 2-3 weeks ago.  I do think it be reasonable to get a PET scan on him to see exactly what is going on down the pelvis.  Again the CT scan that he had done really did not show much of anything down to the pelvis.  Also think that we need to get him back to the Interventional radiology to see if they can help with  the nephrostomy tubes.  I think the RIGHT nephrostomy tube might need to come out.  This was not draining.  There is blood in the bag.  The LEFT tube is draining nice clear urine.  He will take chemotherapy if we think that this is going to be needed.  He is already been somewhat reluctant to go through chemotherapy.  I totally understand this.  We will see what the PET scan shows.  I know this is quite complicated.  I am not sure what kind of studies he will be having done this week.  Sounds like may have a barium enema.  For right now, we will just hold on treatment for him.  I think this would be quite reasonable.  Quality of life is really what we are are looking at.  We really want his quality life to be good.  I think if we tried to treat him now, his quality of life clearly would go down.  I would like to see him back in 4-5 weeks.  If he is in the hospital I will see him in the hospital.   Volanda Napoleon, MD 12/1/20221:39 PM

## 2021-11-18 NOTE — Progress Notes (Signed)
Patient will need a PET scan. Message sent to patient with instructions on how to schedule.

## 2021-11-19 ENCOUNTER — Encounter: Payer: Self-pay | Admitting: *Deleted

## 2021-11-19 ENCOUNTER — Encounter: Payer: Self-pay | Admitting: Hematology & Oncology

## 2021-11-19 DIAGNOSIS — C252 Malignant neoplasm of tail of pancreas: Secondary | ICD-10-CM

## 2021-11-19 LAB — CANCER ANTIGEN 19-9: CA 19-9: 10 U/mL (ref 0–35)

## 2021-11-19 NOTE — Progress Notes (Signed)
Called patient and gave him centralized scheduling's number to schedule his PET as he prefers to schedule himself.   Patient also asks about IR scheduling a follow up for his nephrostomy tubes. Spoke to [employer] in IR and she states a specific order needs to be placed. Order placed.   Oncology Nurse Navigator Documentation  Oncology Nurse Navigator Flowsheets 11/19/2021  Navigator Follow Up Date: 11/26/2021  Navigator Follow Up Reason: Scan Review  Navigator Location [facility]  Navigator Encounter Type Telephone;Appt/Treatment Plan Review  Telephone Appt Confirmation/Clarification;Education;Outgoing Call  Patient Visit Type MedOnc  Treatment Phase Active Tx  Barriers/Navigation Needs Coordination of Care;Education  Education Other  Interventions Coordination of Care;Education;Psycho-Social Support  Acuity Level 2-Minimal Needs (1-2 Barriers Identified)  Coordination of Care Appts;Radiology  Education Method Verbal  Support Groups/Services Friends and Family  Time Spent with Patient 30

## 2021-11-21 ENCOUNTER — Encounter: Payer: Self-pay | Admitting: Hematology & Oncology

## 2021-11-23 ENCOUNTER — Telehealth: Payer: Self-pay

## 2021-11-23 NOTE — Telephone Encounter (Signed)
Transition Care Management Unsuccessful Follow-up Telephone Call  Date of discharge and from where:  11/13/21 from [facility]  Attempts:  1st Attempt  Reason for unsuccessful TCM follow-up call:  Left voice message   Thea Silversmith, RN, MSN, BSN, [place] Management Coordinator [phone]

## 2021-11-24 ENCOUNTER — Other Ambulatory Visit (HOSPITAL_COMMUNITY): Payer: Self-pay | Admitting: Urology

## 2021-11-24 ENCOUNTER — Ambulatory Visit (HOSPITAL_COMMUNITY)
Admission: RE | Admit: 2021-11-24 | Discharge: 2021-11-24 | Disposition: A | Discharge status: Home / Self Care | Payer: Medicare Other | Source: Ambulatory Visit | Attending: Hematology & Oncology | Admitting: Hematology & Oncology

## 2021-11-24 ENCOUNTER — Other Ambulatory Visit: Payer: Self-pay

## 2021-11-24 DIAGNOSIS — C252 Malignant neoplasm of tail of pancreas: Secondary | ICD-10-CM | POA: Diagnosis present

## 2021-11-24 NOTE — Progress Notes (Signed)
Interventional Radiology Progress Note  80 yo male with metastatic pancreatic cancer, with recent admission for acute on chronic kidney failure.   Right distal ureteral stone, with atrophic right kidney and hydronephrosis, and the left sided hydronephrosis was new.  Urology placed a left stent and performed biopsies of bladder (revealing mets of pancreatic CA per notes).   Acute renal failure did not resolve, and he underwent bilateral PCN on 11/24.    Since, renal function has improved, but he reports very little urine from the right PCN.  He desires removal, given the left PCN and his new ostomy.  He feels burdened by the extra bag.   He also has complaints of pain/inflammation at the ostomy site, and requests our team to treat.  I explained that he should follow up with surgery, but we did page the [facility] nursing team today, however it is past 4pm, and I have low confidence of return page now.   We removed his right PCN, as requested at the bedside.   Waiting for [place] page back - will update the patient, and encourage surgery follow up .   Signed,  Dulcy Fanny. Earleen Newport, DO

## 2021-11-25 ENCOUNTER — Other Ambulatory Visit: Payer: Self-pay | Admitting: Hematology & Oncology

## 2021-11-25 DIAGNOSIS — C252 Malignant neoplasm of tail of pancreas: Secondary | ICD-10-CM

## 2021-11-26 ENCOUNTER — Telehealth: Payer: Self-pay

## 2021-11-26 ENCOUNTER — Ambulatory Visit (HOSPITAL_COMMUNITY)
Admission: RE | Admit: 2021-11-26 | Discharge: 2021-11-26 | Disposition: A | Discharge status: Home / Self Care | Payer: Medicare Other | Source: Ambulatory Visit | Attending: Hematology & Oncology | Admitting: Hematology & Oncology

## 2021-11-26 ENCOUNTER — Other Ambulatory Visit: Payer: Self-pay

## 2021-11-26 DIAGNOSIS — C252 Malignant neoplasm of tail of pancreas: Secondary | ICD-10-CM | POA: Insufficient documentation

## 2021-11-26 DIAGNOSIS — N289 Disorder of kidney and ureter, unspecified: Secondary | ICD-10-CM | POA: Diagnosis present

## 2021-11-26 LAB — GLUCOSE, CAPILLARY: Glucose-Capillary: 86 mg/dL (ref 70–99)

## 2021-11-26 MED ORDER — FLUDEOXYGLUCOSE F - 18 (FDG) INJECTION
7.3000 | Freq: Once | INTRAVENOUS | Status: AC
  Administered 2021-11-26: 7.8 via INTRAVENOUS

## 2021-11-26 NOTE — Telephone Encounter (Signed)
Transition Care Management Follow-up Telephone Call Date of discharge and from where: 11/13/2021  [facility] How have you been since you were released from the hospital? "Doing ok, continue to have shoulder pain from procedure" Any questions or concerns? No  Items Reviewed: Did the pt receive and understand the discharge instructions provided? Yes  Medications obtained and verified? Yes  Other? No  Any new allergies since your discharge? No  Dietary orders reviewed? No Do you have support at home? Yes   Home Care and Equipment/Supplies: Were home health services ordered? not applicable If so, what is the name of the agency?  Has the agency set up a time to come to the patient's home? not applicable Were any new equipment or medical supplies ordered?  No What is the name of the medical supply agency?  Were you able to get the supplies/equipment? not applicable Do you have any questions related to the use of the equipment or supplies? No  Functional Questionnaire: (I = Independent and D = Dependent) ADLs: I  Bathing/Dressing- I   Meal Prep- I   Eating- I   Maintaining continence- I   Transferring/Ambulation- I   Managing Meds- I   Follow up appointments reviewed:  PCP Hospital f/u appt confirmed? No   Specialist Hospital f/u appt confirmed? Yes  Sae Dr. Moses Manners on 11/18/2021 Are transportation arrangements needed? No  If their condition worsens, is the pt aware to call PCP or go to the Emergency Dept.? Yes Was the patient provided with contact information for the PCP's office or ED? Yes Was to pt encouraged to call back with questions or concerns? Yes   Tomasa Rand, RN, BSN, CEN [facility] [employer] [phone]

## 2021-11-30 ENCOUNTER — Encounter: Payer: Self-pay | Admitting: *Deleted

## 2021-11-30 NOTE — Progress Notes (Signed)
Oncology Nurse Navigator Documentation  Oncology Nurse Navigator Flowsheets 11/30/2021  Navigator Follow Up Date: 12/14/2021  Navigator Follow Up Reason: Follow-up Appointment  Navigator Location [facility]  Navigator Encounter Type Scan Review  Telephone -  Patient Visit Type MedOnc  Treatment Phase Active Tx  Barriers/Navigation Needs Coordination of Care;Education  Education -  Interventions None Required  Acuity Level 2-Minimal Needs (1-2 Barriers Identified)  Coordination of Care -  Education Method -  Support Groups/Services Friends and Family  Time Spent with Patient 15

## 2021-12-07 ENCOUNTER — Telehealth: Payer: Self-pay | Admitting: Family Medicine

## 2021-12-07 NOTE — Telephone Encounter (Signed)
Patient is aware [facility] will not be back till tomorrow. He states he has surgery at [facility] ever since then he is have sever pain. Tried to make an appointment but only wants to speak with [facility].

## 2021-12-08 ENCOUNTER — Ambulatory Visit: Payer: Medicare Other | Admitting: Hematology & Oncology

## 2021-12-08 ENCOUNTER — Other Ambulatory Visit: Payer: Medicare Other

## 2021-12-08 NOTE — Telephone Encounter (Signed)
Pt informed. He does not feel like the pain is coming from his lungs. Will discuss at visit on 12/23

## 2021-12-08 NOTE — Telephone Encounter (Signed)
It is hard to tell if it was known that he had cancer throughout his lungs and multiple spots of multiple lesions that may be why he has so much pain in his rib cage area.  Again I do not know if this is something he knew about or if it was from a new, cannot find a previous PET scan.  Usually I would discuss this in person but he is already established with oncology but if he wants to discuss with me in person and please bring him into the office

## 2021-12-09 ENCOUNTER — Other Ambulatory Visit: Payer: Self-pay

## 2021-12-09 ENCOUNTER — Telehealth: Payer: Self-pay | Admitting: Student

## 2021-12-09 ENCOUNTER — Encounter (HOSPITAL_BASED_OUTPATIENT_CLINIC_OR_DEPARTMENT_OTHER): Payer: Self-pay | Admitting: *Deleted

## 2021-12-09 ENCOUNTER — Emergency Department (HOSPITAL_BASED_OUTPATIENT_CLINIC_OR_DEPARTMENT_OTHER): Payer: Medicare Other

## 2021-12-09 ENCOUNTER — Observation Stay (HOSPITAL_BASED_OUTPATIENT_CLINIC_OR_DEPARTMENT_OTHER)
Admission: EM | Admit: 2021-12-09 | Discharge: 2021-12-11 | Disposition: A | Discharge status: Home / Self Care | Payer: Medicare Other | Attending: Family Medicine | Admitting: Family Medicine

## 2021-12-09 ENCOUNTER — Encounter: Payer: Self-pay | Admitting: *Deleted

## 2021-12-09 DIAGNOSIS — E875 Hyperkalemia: Secondary | ICD-10-CM | POA: Diagnosis not present

## 2021-12-09 DIAGNOSIS — Z20822 Contact with and (suspected) exposure to covid-19: Secondary | ICD-10-CM | POA: Insufficient documentation

## 2021-12-09 DIAGNOSIS — K56600 Partial intestinal obstruction, unspecified as to cause: Principal | ICD-10-CM | POA: Insufficient documentation

## 2021-12-09 DIAGNOSIS — K56609 Unspecified intestinal obstruction, unspecified as to partial versus complete obstruction: Secondary | ICD-10-CM

## 2021-12-09 DIAGNOSIS — I129 Hypertensive chronic kidney disease with stage 1 through stage 4 chronic kidney disease, or unspecified chronic kidney disease: Secondary | ICD-10-CM | POA: Diagnosis not present

## 2021-12-09 DIAGNOSIS — Z8507 Personal history of malignant neoplasm of pancreas: Secondary | ICD-10-CM | POA: Insufficient documentation

## 2021-12-09 DIAGNOSIS — R1011 Right upper quadrant pain: Secondary | ICD-10-CM | POA: Diagnosis present

## 2021-12-09 DIAGNOSIS — N1832 Chronic kidney disease, stage 3b: Secondary | ICD-10-CM | POA: Diagnosis not present

## 2021-12-09 MED ORDER — ONDANSETRON HCL 4 MG/2ML IJ SOLN
4.0000 mg | Freq: Once | INTRAMUSCULAR | Status: AC
  Administered 2021-12-09: 4 mg via INTRAVENOUS
  Filled 2021-12-09: qty 2

## 2021-12-09 MED ORDER — SODIUM CHLORIDE 0.9 % IV BOLUS
1000.0000 mL | Freq: Once | INTRAVENOUS | Status: AC
  Administered 2021-12-09: 1000 mL via INTRAVENOUS

## 2021-12-09 MED ORDER — MORPHINE SULFATE (PF) 4 MG/ML IV SOLN
4.0000 mg | Freq: Once | INTRAVENOUS | Status: AC
  Administered 2021-12-09: 4 mg via INTRAVENOUS
  Filled 2021-12-09: qty 1

## 2021-12-09 NOTE — Progress Notes (Signed)
Patient calling with concerns about his nephrostomy tubes. He states he started bleeding into his bag this morning and then the drainage stopped. He doesn't believe it's functioning at this time.   Nephrostomy is managed by IR and Dr Louis Meckel. I instructed patient to call IR and Dr Louis Meckel to get direction on how to manage these symptoms. Explained that if the tube wasn't functioning he'd likely need assessment by IR. He agreed and said he would call.   Oncology Nurse Navigator Documentation  Oncology Nurse Navigator Flowsheets 12/09/2021  Navigator Follow Up Date: 12/14/2021  Navigator Follow Up Reason: Follow-up Appointment  Navigator Location [facility]  Navigator Encounter Type Telephone  Telephone Symptom Mgt;Incoming Call  Patient Visit Type MedOnc  Treatment Phase Active Tx  Barriers/Navigation Needs Coordination of Care;Education  Education Pain/ Symptom Management  Interventions Education  Acuity Level 2-Minimal Needs (1-2 Barriers Identified)  Coordination of Care -  Education Method Verbal  Support Groups/Services Friends and Family  Time Spent with Patient 15

## 2021-12-09 NOTE — ED Provider Notes (Signed)
[facility] Provider Note   CSN: [id number] Arrival date & time: 12/09/21  2147     History Chief Complaint  Patient presents with   Abdominal Pain    Kevin Villegas is a 80 y.o. male.   [place] with a chief complaints of abdominal pain bloating and nausea.  Going on for about a day.  Patient unfortunately has a complicated history of pancreatic cancer has biliary stents as well as urostomy and colostomy.  He is concerned that he has another bowel blockage or perhaps his stent is collapsed.  He was having hematuria and is urostomy tube.  This is gotten much better over the course the day.  He called IR and they felt that just monitoring at home was reasonable.  He has had no fevers or chills.  No constipation or diarrhea.  The history is provided by the patient.  Abdominal Pain Pain location:  RUQ and RLQ Pain quality: aching and cramping   Pain radiates to:  Does not radiate Pain severity:  Moderate Onset quality:  Gradual Duration:  1 day Timing:  Intermittent Progression:  Partially resolved Chronicity:  Recurrent Relieved by:  Nothing Worsened by:  Nothing Ineffective treatments:  None tried Associated symptoms: hematuria and nausea   Associated symptoms: no chest pain, no chills, no diarrhea, no fever, no shortness of breath and no vomiting       Past Medical History:  Diagnosis Date   Colostomy present ([place])    Large bowel obstruction ([place])    Pancreatic cancer [facility])     Patient Active Problem List   Diagnosis Date Noted   Partial small bowel obstruction ([place]) 12/10/2021   Constipation 79/39/0300   Complicated UTI (urinary tract infection) 11/10/2021   Acute pyelonephritis 11/10/2021   Colostomy in place [facility]) 11/10/2021   Stage 3a chronic kidney disease (CKD) ([place]) 11/10/2021   Pyelonephritis    Essential hypertension 10/30/2021   Hydronephrosis of left kidney 10/28/2021   Hyperkalemia 10/28/2021   Acute kidney injury  superimposed on chronic kidney disease ([place]) 10/27/2021   Ureteral stricture, left 10/27/2021   Protein-calorie malnutrition, severe 06/12/2021   Hydroureter on right 06/10/2021   Aortic atherosclerosis ([place]) 06/10/2021   Renal insufficiency 06/09/2021   Acute prerenal azotemia 06/02/2021   Leukocytosis 06/02/2021   Right ureteral stone 06/02/2021   Large bowel obstruction (HCC) secondary to colonic stricture 06/01/2021   Colon stricture ([place])    Pancreatic mass 06/27/2019   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  with metastatic disease 06/27/2019   Rhinitis medicamentosa 02/26/2019    Past Surgical History:  Procedure Laterality Date   ABDOMINAL SURGERY     BIOPSY  05/20/2021   Procedure: BIOPSY;  Surgeon: Otis Brace, MD;  Location: [facility];  Service: Gastroenterology;;   BOWEL RESECTION     BOWEL RESECTION     CHOLECYSTECTOMY N/A 12/16/2014   Procedure: LAPAROSCOPIC CHOLECYSTECTOMY WITH INTRAOPERATIVE CHOLANGIOGRAM;  Surgeon: Georganna Skeans, MD;  Location: [place];  Service: General;  Laterality: N/A;   COLONIC STENT PLACEMENT N/A 05/26/2021   Procedure: COLONIC STENT PLACEMENT;  Surgeon: Clarene Essex, MD;  Location: [facility];  Service: Endoscopy;  Laterality: N/A;   CYSTOSCOPY W/ URETERAL STENT PLACEMENT Left 10/27/2021   Procedure: CYSTOSCOPY WITH RETROGRADE PYELOGRAM/URETERAL STENT PLACEMENT;  Surgeon: Raynelle Bring, MD;  Location: [facility];  Service: Urology;  Laterality: Left;   ESOPHAGOGASTRODUODENOSCOPY N/A 05/20/2021   Procedure: ESOPHAGOGASTRODUODENOSCOPY (EGD);  Surgeon: Otis Brace, MD;  Location: [facility];  Service: Gastroenterology;  Laterality: N/A;   EUS N/A 09/24/2015   Procedure: UPPER ENDOSCOPIC ULTRASOUND (EUS) LINEAR;  Surgeon: Milus Banister, MD;  Location: [facility];  Service: Endoscopy;  Laterality: N/A;   FLEXIBLE SIGMOIDOSCOPY N/A 05/20/2021   Procedure: FLEXIBLE SIGMOIDOSCOPY;  Surgeon: Otis Brace, MD;   Location: [facility];  Service: Gastroenterology;  Laterality: N/A;   FLEXIBLE SIGMOIDOSCOPY N/A 05/26/2021   Procedure: FLEXIBLE SIGMOIDOSCOPY;  Surgeon: Clarene Essex, MD;  Location: [facility];  Service: Endoscopy;  Laterality: N/A;   IR NEPHROSTOMY PLACEMENT LEFT  11/11/2021   IR NEPHROSTOMY PLACEMENT RIGHT  11/11/2021   LAPAROSCOPIC DISTAL PANCREATECTOMY  10/2017   [facility]   LAPAROSCOPIC PARTIAL GASTRECTOMY  10/2017   [facility]   LAPAROSCOPIC SIGMOID COLECTOMY N/A 06/14/2021   Procedure: LAPAROSCOPY WITH MOBILIZATION, PROXIMAL DIVERTING COLOSTOMY, CREATION OF DISTAL MUCOUS FISTULA;  Surgeon: Johnathan Hausen, MD;  Location: [facility];  Service: General;  Laterality: N/A;   LAPAROSCOPIC SPLENECTOMY  10/2017   [facility]   VASECTOMY         Family History  Problem Relation Age of Onset   Stroke Mother     Social History   Tobacco Use   Smoking status: Never   Smokeless tobacco: Never  Vaping Use   Vaping Use: Never used  Substance Use Topics   Alcohol use: No    Alcohol/week: 0.0 standard drinks   Drug use: No    Home Medications Prior to Admission medications   Medication Sig Start Date End Date Taking? Authorizing Provider  amLODipine (NORVASC) 5 MG tablet Take 1 tablet (5 mg total) by mouth daily. 11/14/21 12/14/21  Mariel Aloe, MD  oxymetazoline (AFRIN) 0.05 % nasal spray Place 1 spray into both nostrils 2 (two) times daily as needed for congestion.    [provider]  polyethylene glycol (MIRALAX / GLYCOLAX) 17 g packet Take 17 g by mouth daily. 10/31/21   Mariel Aloe, MD  tamsulosin (FLOMAX) 0.4 MG CAPS capsule Take 1 capsule (0.4 mg total) by mouth daily after supper. 11/13/21 12/13/21  Mariel Aloe, MD    Allergies    Pollen extract  Review of Systems   Review of Systems  Constitutional:  Negative for chills and fever.  HENT:  Negative for congestion and facial swelling.   Eyes:  Negative for discharge and visual disturbance.  Respiratory:  Negative  for shortness of breath.   Cardiovascular:  Negative for chest pain and palpitations.  Gastrointestinal:  Positive for abdominal pain and nausea. Negative for diarrhea and vomiting.  Genitourinary:  Positive for hematuria.  Musculoskeletal:  Negative for arthralgias and myalgias.  Skin:  Negative for color change and rash.  Neurological:  Negative for tremors, syncope and headaches.  Psychiatric/Behavioral:  Negative for confusion and dysphoric mood.    Physical Exam Updated Vital Signs BP (!) 121/97 (BP Location: Left Leg)    Pulse 99    Temp 98.7 F (37.1 C) (Oral)    Resp 20    Ht 5\' 11"  (1.803 m)    Wt 65.8 kg    SpO2 98%    BMI 20.22 kg/m   Physical Exam Vitals and nursing note reviewed.  Constitutional:      Appearance: He is well-developed.  HENT:     Head: Normocephalic and atraumatic.  Eyes:     Pupils: Pupils are equal, round, and reactive to light.  Neck:     Vascular: No JVD.  Cardiovascular:     Rate and Rhythm: Normal rate  and regular rhythm.     Heart sounds: No murmur heard.   No friction rub. No gallop.  Pulmonary:     Effort: No respiratory distress.     Breath sounds: No wheezing.  Abdominal:     General: There is no distension.     Tenderness: There is no abdominal tenderness. There is no guarding or rebound.     Comments: Mild diffuse abdominal discomfort that is worst on the right side than the left.  Colostomy bag in place without obvious surrounding erythema induration or drainage.  Urostomy tube to the left also without surrounding erythema induration.  Musculoskeletal:        General: Normal range of motion.     Cervical back: Normal range of motion and neck supple.  Skin:    Coloration: Skin is not pale.     Findings: No rash.  Neurological:     Mental Status: He is alert and oriented to person, place, and time.  Psychiatric:        Behavior: Behavior normal.    ED Results / Procedures / Treatments   Labs (all labs ordered are listed, but  only abnormal results are displayed) Labs Reviewed  CBC WITH DIFFERENTIAL/PLATELET - Abnormal; Notable for the following components:      Result Value   WBC 13.5 (*)    Platelets 607 (*)    Neutro Abs 10.5 (*)    Monocytes Absolute 1.6 (*)    All other components within normal limits  COMPREHENSIVE METABOLIC PANEL - Abnormal; Notable for the following components:   Sodium 131 (*)    Chloride 96 (*)    Glucose, Bld 114 (*)    BUN 33 (*)    Creatinine, Ser 1.78 (*)    Albumin 3.1 (*)    AST 73 (*)    ALT 55 (*)    GFR, Estimated 38 (*)    All other components within normal limits  URINALYSIS, ROUTINE W REFLEX MICROSCOPIC - Abnormal; Notable for the following components:   APPearance CLOUDY (*)    Hgb urine dipstick LARGE (*)    Protein, ur >300 (*)    Leukocytes,Ua SMALL (*)    All other components within normal limits  URINALYSIS, MICROSCOPIC (REFLEX) - Abnormal; Notable for the following components:   Bacteria, UA MANY (*)    All other components within normal limits  I-STAT CHEM 8, ED - Abnormal; Notable for the following components:   Sodium 134 (*)    Potassium 5.7 (*)    BUN 41 (*)    Creatinine, Ser 1.90 (*)    Glucose, Bld 109 (*)    Calcium, Ion 1.13 (*)    All other components within normal limits  RESP PANEL BY RT-PCR (FLU A&B, COVID) ARPGX2  LIPASE, BLOOD    EKG None  Radiology CT ABDOMEN PELVIS WO CONTRAST  Result Date: 12/10/2021 CLINICAL DATA:  History of metastatic pancreatic carcinoma with abdominal pain and blood in nephrostomy catheters EXAM: CT ABDOMEN AND PELVIS WITHOUT CONTRAST TECHNIQUE: Multidetector CT imaging of the abdomen and pelvis was performed following the standard protocol without IV contrast. COMPARISON:  PET-CT from 11/26/2021 FINDINGS: Lower chest: Left-sided pleural effusion is noted. Multiple somewhat spiculated nodules are noted throughout the lung bases similar to that seen on the prior PET-CT consistent with metastatic disease.  Hepatobiliary: Cysts are again seen within the liver stable from prior exams. The gallbladder has been surgically removed. Pancreas: Persistent soft tissue density is noted along the pancreatic  body adjacent to the known distal duodenal stent similar to that seen on prior PET-CT. It is incompletely evaluated due to lack of IV contrast. Spleen: Surgically removed Adrenals/Urinary Tract: Adrenal glands are within normal limits bilaterally. Left kidney demonstrates a nephrostomy catheter in place as well as a ureteral stent in place. No obstructive changes are seen. No renal calculi are noted. A small amount of air is noted within the left collecting system related to the nephrostomy catheter. Right kidney demonstrates a an upper pole cyst as well as evidence of hydronephrosis which extends to the level of urinary bladder. These changes are similar to that seen on prior PET-CT and distally a stone is again identified measuring approximately 7 mm. Cortical thinning in the right kidney is noted related to the longstanding obstruction. The bladder is decompressed. Stomach/Bowel: Hartmann's pouch is noted within the distal colon diverticular changes noted within the pouch. More proximal colon shows some mild retained fecal material. No inflammatory changes are seen. Mild diverticular changes noted. The appendix is within normal limits. A left-sided colostomy is again identified without obstructive change. Stomach is distended with fluid. Postsurgical changes are noted. There is a distal duodenal stent again identified. Small-bowel dilatation is noted in the proximal jejunum extending distally to an area of transition seen in the pelvis just to the left of the midline. This is noted on image number 62 of series 2 and image number 47 of series 5. This is likely related to adhesions. Some fecalization of bowel contents is noted. The more distal small bowel is within normal limits. Vascular/Lymphatic: Aortic atherosclerosis.  No enlarged abdominal or pelvic lymph nodes. Reproductive: Prostate is unremarkable. Other: No abdominal wall hernia or abnormality. No abdominopelvic ascites. Musculoskeletal: No acute bony abnormality is noted. Some persistent soft tissue density is noted along the anterior abdominal wall on the right on image number 52 of series 2 similar to that seen on prior PET-CT. IMPRESSION: Small-bowel dilatation in the jejunum and likely proximal ileum with a transition zone in the left hemipelvis likely related to adhesions. These changes are consistent with a partial small bowel obstruction. Changes consistent with left-sided pleural effusion and multiple pulmonary metastatic lesions. Fullness in the region of the pancreatic body similar to that seen on prior PET-CT but incompletely evaluated on this exam due to the lack of IV contrast. Left ureteral stent is noted in place. Left nephrostomy catheter is noted in the lower pole collecting system. No obstructive changes are seen. Persistent right-sided hydronephrosis and hydroureter is noted secondary to 7 mm distal right ureteral stone similar to that seen on the prior exam. Stable appearing soft tissue nodules in the right anterior abdominal wall similar to prior PET-CT. Electronically Signed   By: Inez Catalina M.D.   On: 12/10/2021 00:06    Procedures Procedures   Medications Ordered in ED Medications  sodium chloride 0.9 % bolus 1,000 mL (1,000 mLs Intravenous New Bag/Given 12/09/21 2357)  morphine 4 MG/ML injection 4 mg (4 mg Intravenous Given 12/09/21 2358)  ondansetron (ZOFRAN) injection 4 mg (4 mg Intravenous Given 12/09/21 2358)    ED Course  I have reviewed the triage vital signs and the nursing notes.  Pertinent labs & imaging results that were available during my care of the patient were reviewed by me and considered in my medical decision making (see chart for details).    MDM Rules/Calculators/A&P  80 yo M with a  chief complaints of abdominal discomfort and nausea.  He is concerned that he has a small bowel obstruction or perhaps has had a collapse or occlusion of his biliary stent.  He does feel a bit better now than he did just about 20 minutes ago.  We will obtain lab work treat pain and nausea CT reassess.  CT is concerning for a partial small bowel obstruction.  I discussed the case with the general surgeon, Dr. Brantley Stage will see the patient in the morning.  Discussed with medicine for admission.  The patients results and plan were reviewed and discussed.   Any x-rays performed were independently reviewed by myself.   Differential diagnosis were considered with the presenting HPI.  Medications  sodium chloride 0.9 % bolus 1,000 mL (1,000 mLs Intravenous New Bag/Given 12/09/21 2357)  morphine 4 MG/ML injection 4 mg (4 mg Intravenous Given 12/09/21 2358)  ondansetron (ZOFRAN) injection 4 mg (4 mg Intravenous Given 12/09/21 2358)    Vitals:   12/09/21 2155 12/09/21 2159  BP:  (!) 121/97  Pulse:  99  Resp:  20  Temp:  98.7 F (37.1 C)  TempSrc:  Oral  SpO2:  98%  Weight: 65.8 kg   Height: 5\' 11"  (1.803 m)     Final diagnoses:  SBO (small bowel obstruction) (HCC)       Final Clinical Impression(s) / ED Diagnoses Final diagnoses:  SBO (small bowel obstruction) [facility])    Rx / DC Orders ED Discharge Orders     None        Deno Etienne, DO 12/10/21 7847

## 2021-12-09 NOTE — ED Triage Notes (Signed)
Multiple complaints. Abdominal bloating and pain.

## 2021-12-09 NOTE — Telephone Encounter (Signed)
Patient called IR this morning with concerns about blood in his nephrostomy bag and decreased output when he woke up. He thinks he may have rolled over on it during the night. He stated he felt fine. I advised the patient to wait a few hours to see if urine output picked up and when I checked back in with him he said that the urine had cleared and output was normal.   Patient advised to call IR if any further issues with nephrostomy tube.  Soyla Dryer, [place] [phone] 12/09/2021, 1:59 PM

## 2021-12-10 ENCOUNTER — Ambulatory Visit: Payer: Medicare Other | Admitting: Family Medicine

## 2021-12-10 ENCOUNTER — Observation Stay (HOSPITAL_COMMUNITY): Payer: Medicare Other

## 2021-12-10 DIAGNOSIS — Z8507 Personal history of malignant neoplasm of pancreas: Secondary | ICD-10-CM | POA: Diagnosis not present

## 2021-12-10 DIAGNOSIS — K56600 Partial intestinal obstruction, unspecified as to cause: Secondary | ICD-10-CM

## 2021-12-10 DIAGNOSIS — Z20822 Contact with and (suspected) exposure to covid-19: Secondary | ICD-10-CM | POA: Diagnosis not present

## 2021-12-10 DIAGNOSIS — I129 Hypertensive chronic kidney disease with stage 1 through stage 4 chronic kidney disease, or unspecified chronic kidney disease: Secondary | ICD-10-CM | POA: Diagnosis not present

## 2021-12-10 DIAGNOSIS — R1011 Right upper quadrant pain: Secondary | ICD-10-CM | POA: Diagnosis present

## 2021-12-10 DIAGNOSIS — E875 Hyperkalemia: Secondary | ICD-10-CM | POA: Diagnosis not present

## 2021-12-10 DIAGNOSIS — N1832 Chronic kidney disease, stage 3b: Secondary | ICD-10-CM | POA: Diagnosis not present

## 2021-12-10 LAB — CBC WITH DIFFERENTIAL/PLATELET
Abs Immature Granulocytes: 0.07 10*3/uL (ref 0.00–0.07)
Abs Immature Granulocytes: 0.13 10*3/uL — ABNORMAL HIGH (ref 0.00–0.07)
Basophils Absolute: 0.1 10*3/uL (ref 0.0–0.1)
Basophils Absolute: 0.1 10*3/uL (ref 0.0–0.1)
Basophils Relative: 0 %
Basophils Relative: 0 %
Eosinophils Absolute: 0.1 10*3/uL (ref 0.0–0.5)
Eosinophils Absolute: 0.1 10*3/uL (ref 0.0–0.5)
Eosinophils Relative: 0 %
Eosinophils Relative: 0 %
HCT: 39.5 % (ref 39.0–52.0)
HCT: 40.2 % (ref 39.0–52.0)
Hemoglobin: 12.8 g/dL — ABNORMAL LOW (ref 13.0–17.0)
Hemoglobin: 13.4 g/dL (ref 13.0–17.0)
Immature Granulocytes: 1 %
Immature Granulocytes: 1 %
Lymphocytes Relative: 7 %
Lymphocytes Relative: 9 %
Lymphs Abs: 1.2 10*3/uL (ref 0.7–4.0)
Lymphs Abs: 1.4 10*3/uL (ref 0.7–4.0)
MCH: 29.9 pg (ref 26.0–34.0)
MCH: 30 pg (ref 26.0–34.0)
MCHC: 32.4 g/dL (ref 30.0–36.0)
MCHC: 33.3 g/dL (ref 30.0–36.0)
MCV: 89.9 fL (ref 80.0–100.0)
MCV: 92.3 fL (ref 80.0–100.0)
Monocytes Absolute: 1.6 10*3/uL — ABNORMAL HIGH (ref 0.1–1.0)
Monocytes Absolute: 1.9 10*3/uL — ABNORMAL HIGH (ref 0.1–1.0)
Monocytes Relative: 12 %
Monocytes Relative: 9 %
Neutro Abs: 10.5 10*3/uL — ABNORMAL HIGH (ref 1.7–7.7)
Neutro Abs: 17.1 10*3/uL — ABNORMAL HIGH (ref 1.7–7.7)
Neutrophils Relative %: 78 %
Neutrophils Relative %: 83 %
Platelets: 607 10*3/uL — ABNORMAL HIGH (ref 150–400)
Platelets: 637 10*3/uL — ABNORMAL HIGH (ref 150–400)
RBC: 4.28 MIL/uL (ref 4.22–5.81)
RBC: 4.47 MIL/uL (ref 4.22–5.81)
RDW: 14.6 % (ref 11.5–15.5)
RDW: 14.8 % (ref 11.5–15.5)
WBC: 13.5 10*3/uL — ABNORMAL HIGH (ref 4.0–10.5)
WBC: 20.7 10*3/uL — ABNORMAL HIGH (ref 4.0–10.5)
nRBC: 0 % (ref 0.0–0.2)
nRBC: 0 % (ref 0.0–0.2)

## 2021-12-10 LAB — I-STAT CHEM 8, ED
BUN: 41 mg/dL — ABNORMAL HIGH (ref 8–23)
Calcium, Ion: 1.13 mmol/L — ABNORMAL LOW (ref 1.15–1.40)
Chloride: 101 mmol/L (ref 98–111)
Creatinine, Ser: 1.9 mg/dL — ABNORMAL HIGH (ref 0.61–1.24)
Glucose, Bld: 109 mg/dL — ABNORMAL HIGH (ref 70–99)
HCT: 40 % (ref 39.0–52.0)
Hemoglobin: 13.6 g/dL (ref 13.0–17.0)
Potassium: 5.7 mmol/L — ABNORMAL HIGH (ref 3.5–5.1)
Sodium: 134 mmol/L — ABNORMAL LOW (ref 135–145)
TCO2: 28 mmol/L (ref 22–32)

## 2021-12-10 LAB — URINALYSIS, MICROSCOPIC (REFLEX): RBC / HPF: >50 RBC/hpf (ref 0–5)

## 2021-12-10 LAB — COMPREHENSIVE METABOLIC PANEL
ALT: 43 U/L (ref 0–44)
ALT: 55 U/L — ABNORMAL HIGH (ref 0–44)
AST: 50 U/L — ABNORMAL HIGH (ref 15–41)
AST: 73 U/L — ABNORMAL HIGH (ref 15–41)
Albumin: 2.9 g/dL — ABNORMAL LOW (ref 3.5–5.0)
Albumin: 3.1 g/dL — ABNORMAL LOW (ref 3.5–5.0)
Alkaline Phosphatase: 81 U/L (ref 38–126)
Alkaline Phosphatase: 85 U/L (ref 38–126)
Anion gap: 11 (ref 5–15)
Anion gap: 12 (ref 5–15)
BUN: 33 mg/dL — ABNORMAL HIGH (ref 8–23)
BUN: 33 mg/dL — ABNORMAL HIGH (ref 8–23)
CO2: 22 mmol/L (ref 22–32)
CO2: 23 mmol/L (ref 22–32)
Calcium: 8.4 mg/dL — ABNORMAL LOW (ref 8.9–10.3)
Calcium: 9.1 mg/dL (ref 8.9–10.3)
Chloride: 102 mmol/L (ref 98–111)
Chloride: 96 mmol/L — ABNORMAL LOW (ref 98–111)
Creatinine, Ser: 1.77 mg/dL — ABNORMAL HIGH (ref 0.61–1.24)
Creatinine, Ser: 1.78 mg/dL — ABNORMAL HIGH (ref 0.61–1.24)
GFR, Estimated: 38 mL/min — ABNORMAL LOW (ref >60)
GFR, Estimated: 39 mL/min — ABNORMAL LOW (ref >60)
Glucose, Bld: 114 mg/dL — ABNORMAL HIGH (ref 70–99)
Glucose, Bld: 92 mg/dL (ref 70–99)
Potassium: 4.7 mmol/L (ref 3.5–5.1)
Potassium: 4.9 mmol/L (ref 3.5–5.1)
Sodium: 131 mmol/L — ABNORMAL LOW (ref 135–145)
Sodium: 135 mmol/L (ref 135–145)
Total Bilirubin: 0.5 mg/dL (ref 0.3–1.2)
Total Bilirubin: 1.1 mg/dL (ref 0.3–1.2)
Total Protein: 6.9 g/dL (ref 6.5–8.1)
Total Protein: 7.8 g/dL (ref 6.5–8.1)

## 2021-12-10 LAB — URINALYSIS, ROUTINE W REFLEX MICROSCOPIC
Bilirubin Urine: NEGATIVE
Glucose, UA: NEGATIVE mg/dL
Ketones, ur: NEGATIVE mg/dL
Nitrite: NEGATIVE
Protein, ur: >300 mg/dL — AB
Specific Gravity, Urine: 1.025 (ref 1.005–1.030)
pH: 5.5 (ref 5.0–8.0)

## 2021-12-10 LAB — RESP PANEL BY RT-PCR (FLU A&B, COVID) ARPGX2
Influenza A by PCR: NEGATIVE
Influenza B by PCR: NEGATIVE
SARS Coronavirus 2 by RT PCR: NEGATIVE

## 2021-12-10 LAB — LIPASE, BLOOD: Lipase: 43 U/L (ref 11–51)

## 2021-12-10 MED ORDER — SODIUM ZIRCONIUM CYCLOSILICATE 10 G PO PACK
10.0000 g | PACK | Freq: Two times a day (BID) | ORAL | Status: DC
Start: 2021-12-10 — End: 2021-12-10

## 2021-12-10 MED ORDER — HYDRALAZINE HCL 20 MG/ML IJ SOLN: 10.0000 mg | Freq: Three times a day (TID) | INTRAMUSCULAR | Status: DC | PRN

## 2021-12-10 MED ORDER — SODIUM CHLORIDE 0.9 % IV SOLN: INTRAVENOUS | Status: DC

## 2021-12-10 MED ORDER — ONDANSETRON HCL 4 MG/2ML IJ SOLN: 4.0000 mg | Freq: Four times a day (QID) | INTRAMUSCULAR | Status: DC | PRN

## 2021-12-10 MED ORDER — CHLORHEXIDINE GLUCONATE CLOTH 2 % EX PADS
6.0000 | MEDICATED_PAD | Freq: Every day | CUTANEOUS | Status: DC
  Administered 2021-12-10 – 2021-12-11 (×2): 6 via TOPICAL

## 2021-12-10 MED ORDER — ENOXAPARIN SODIUM 40 MG/0.4ML IJ SOSY
40.0000 mg | PREFILLED_SYRINGE | INTRAMUSCULAR | Status: DC
  Filled 2021-12-10: qty 0.4

## 2021-12-10 MED ORDER — ONDANSETRON HCL 4 MG PO TABS: 4.0000 mg | ORAL_TABLET | Freq: Four times a day (QID) | ORAL | Status: DC | PRN

## 2021-12-10 MED ORDER — DIATRIZOATE MEGLUMINE & SODIUM 66-10 % PO SOLN
90.0000 mL | Freq: Once | ORAL | Status: AC
  Administered 2021-12-10: 12:00:00 90 mL via ORAL
  Filled 2021-12-10: qty 90

## 2021-12-10 MED ORDER — SODIUM CHLORIDE 0.9% FLUSH
10.0000 mL | INTRAVENOUS | Status: DC | PRN
  Administered 2021-12-11: 05:00:00 10 mL

## 2021-12-10 NOTE — ED Notes (Signed)
ED Provider at bedside. 

## 2021-12-10 NOTE — ED Provider Notes (Signed)
Patient has not yet been assigned to a bed at [facility], now here >12 hours. I spoke with Dr. Kathrynn Humble, [place] at [facility] who will accept in transfer there to expedite his care and consults with Gen Surg and Hospitalist.    Truddie Hidden, MD 12/10/21 1009

## 2021-12-10 NOTE — ED Notes (Signed)
Patient's personal belongings placed in bag for patient.  Shoes removed per request.  Patient aware of plan of care.  No complaints voiced at this time

## 2021-12-10 NOTE — H&P (Signed)
History and Physical    Kevin Villegas ZYS:[id number] DOB: [birth year] DOA: 12/09/2021  PCP: Dettinger, Kevin Kaufmann, MD  Patient coming from: Home  Chief Complaint: abdominal pain, decreased ostomy outpt  HPI: Kevin Villegas is a 80 y.o. male with medical history significant of pancreatic cancer w/ mets, obstructive nephropathy s/p nephrostomy placement, HTN, bowel obstruction s/p colectomy and colostomy placement, CKD3b. Presenting w/ abdominal pain and decrease colostomy output. Symptoms started yesterday. He felt that he was bloated and tight in his stomach. He noticed that he colostomy output had decreased. There was no blood in the ostomy. He did not have any nausea or vomiting. He did not have any fever. His symptoms reminded him of a previous blockage that he had. He took miralax. It didn't help. He became concerned and came to the ED.   ED Course: CT showed pSBO. Surgery was consulted. [facility] was called for admission.   Review of Systems:  Review of systems is otherwise negative for all not mentioned in HPI.   PMHx Past Medical History:  Diagnosis Date   Colostomy present ([place])    Large bowel obstruction ([place])    Pancreatic cancer (Kevin Villegas)     PSHx Past Surgical History:  Procedure Laterality Date   ABDOMINAL SURGERY     BIOPSY  05/20/2021   Procedure: BIOPSY;  Surgeon: Otis Brace, MD;  Location: [facility];  Service: Gastroenterology;;   BOWEL RESECTION     BOWEL RESECTION     CHOLECYSTECTOMY N/A 12/16/2014   Procedure: LAPAROSCOPIC CHOLECYSTECTOMY WITH INTRAOPERATIVE CHOLANGIOGRAM;  Surgeon: Georganna Skeans, MD;  Location: [facility];  Service: General;  Laterality: N/A;   COLONIC STENT PLACEMENT N/A 05/26/2021   Procedure: COLONIC STENT PLACEMENT;  Surgeon: Clarene Essex, MD;  Location: [facility];  Service: Endoscopy;  Laterality: N/A;   CYSTOSCOPY W/ URETERAL STENT PLACEMENT Left 10/27/2021   Procedure: CYSTOSCOPY WITH RETROGRADE PYELOGRAM/URETERAL STENT PLACEMENT;  Surgeon:  Raynelle Bring, MD;  Location: [facility];  Service: Urology;  Laterality: Left;   ESOPHAGOGASTRODUODENOSCOPY N/A 05/20/2021   Procedure: ESOPHAGOGASTRODUODENOSCOPY (EGD);  Surgeon: Otis Brace, MD;  Location: [facility];  Service: Gastroenterology;  Laterality: N/A;   EUS N/A 09/24/2015   Procedure: UPPER ENDOSCOPIC ULTRASOUND (EUS) LINEAR;  Surgeon: Milus Banister, MD;  Location: [facility];  Service: Endoscopy;  Laterality: N/A;   FLEXIBLE SIGMOIDOSCOPY N/A 05/20/2021   Procedure: FLEXIBLE SIGMOIDOSCOPY;  Surgeon: Otis Brace, MD;  Location: [facility];  Service: Gastroenterology;  Laterality: N/A;   FLEXIBLE SIGMOIDOSCOPY N/A 05/26/2021   Procedure: FLEXIBLE SIGMOIDOSCOPY;  Surgeon: Clarene Essex, MD;  Location: [facility];  Service: Endoscopy;  Laterality: N/A;   IR NEPHROSTOMY PLACEMENT LEFT  11/11/2021   IR NEPHROSTOMY PLACEMENT RIGHT  11/11/2021   LAPAROSCOPIC DISTAL PANCREATECTOMY  10/2017   [facility]   LAPAROSCOPIC PARTIAL GASTRECTOMY  10/2017   [facility]   LAPAROSCOPIC SIGMOID COLECTOMY N/A 06/14/2021   Procedure: LAPAROSCOPY WITH MOBILIZATION, PROXIMAL DIVERTING COLOSTOMY, CREATION OF DISTAL MUCOUS FISTULA;  Surgeon: Johnathan Hausen, MD;  Location: [facility];  Service: General;  Laterality: N/A;   LAPAROSCOPIC SPLENECTOMY  10/2017   [facility]   VASECTOMY      SocHx  reports that he has never smoked. He has never used smokeless tobacco. He reports that he does not drink alcohol and does not use drugs.  Allergies  Allergen Reactions   Pollen Extract Other (See Comments)    Sinus congestion    FamHx Family History  Problem Relation Age of Onset   Stroke Mother  Prior to Admission medications   Medication Sig Start Date End Date Taking? Authorizing Provider  amLODipine (NORVASC) 5 MG tablet Take 1 tablet (5 mg total) by mouth daily. 11/14/21 12/14/21  Mariel Aloe, MD  oxymetazoline (AFRIN) 0.05 % nasal spray Place 1 spray into both nostrils 2 (two) times daily as  needed for congestion.    [provider]  polyethylene glycol (MIRALAX / GLYCOLAX) 17 g packet Take 17 g by mouth daily. 10/31/21   Mariel Aloe, MD  tamsulosin (FLOMAX) 0.4 MG CAPS capsule Take 1 capsule (0.4 mg total) by mouth daily after supper. 11/13/21 12/13/21  Mariel Aloe, MD    Physical Exam: Vitals:   12/10/21 0700 12/10/21 0800 12/10/21 1128 12/10/21 1129  BP: 108/73 100/75 121/78   Pulse: 89 86 83 83  Resp: 17 18 18    Temp:   98.2 F (36.8 C)   TempSrc:   Oral   SpO2: 92% 94% 95% 95%  Weight:      Height:        General: 80 y.o. male resting in bed in NAD Eyes: PERRL, normal sclera ENMT: Nares patent w/o discharge, orophaynx clear, dentition normal, ears w/o discharge/lesions/ulcers Neck: Supple, trachea midline Cardiovascular: RRR, +S1, S2, no m/g/r, equal pulses throughout Respiratory: CTABL, no w/r/r, normal WOB GI: BS hypoactive, ND, NT, ostomy in place w/ soft brown stool, nephrostomy noted MSK: No e/c/c Neuro: A&O x 3, no focal deficits Psyc: Appropriate interaction and affect, calm/cooperative  Labs on Admission: I have personally reviewed following labs and imaging studies  CBC: Recent Labs  Lab 12/09/21 2341 12/10/21 0036  WBC 13.5*  --   NEUTROABS 10.5*  --   HGB 13.4 13.6  HCT 40.2 40.0  MCV 89.9  --   PLT 607*  --    Basic Metabolic Panel: Recent Labs  Lab 12/09/21 2341 12/10/21 0036  NA 131* 134*  K 4.9 5.7*  CL 96* 101  CO2 23  --   GLUCOSE 114* 109*  BUN 33* 41*  CREATININE 1.78* 1.90*  CALCIUM 9.1  --    GFR: Estimated Creatinine Clearance: 29.3 mL/min (A) (by C-G formula based on SCr of 1.9 mg/dL (H)). Liver Function Tests: Recent Labs  Lab 12/09/21 2341  AST 73*  ALT 55*  ALKPHOS 85  BILITOT 0.5  PROT 7.8  ALBUMIN 3.1*   Recent Labs  Lab 12/09/21 2341  LIPASE 43   No results for input(s): AMMONIA in the last 168 hours. Coagulation Profile: No results for input(s): INR, PROTIME in the last 168  hours. Cardiac Enzymes: No results for input(s): CKTOTAL, CKMB, CKMBINDEX, TROPONINI in the last 168 hours. BNP (last 3 results) No results for input(s): PROBNP in the last 8760 hours. HbA1C: No results for input(s): HGBA1C in the last 72 hours. CBG: No results for input(s): GLUCAP in the last 168 hours. Lipid Profile: No results for input(s): CHOL, HDL, LDLCALC, TRIG, CHOLHDL, LDLDIRECT in the last 72 hours. Thyroid Function Tests: No results for input(s): TSH, T4TOTAL, FREET4, T3FREE, THYROIDAB in the last 72 hours. Anemia Panel: No results for input(s): VITAMINB12, FOLATE, FERRITIN, TIBC, IRON, RETICCTPCT in the last 72 hours. Urine analysis:    Component Value Date/Time   COLORURINE YELLOW 12/10/2021 0032   APPEARANCEUR CLOUDY (A) 12/10/2021 0032   LABSPEC 1.025 12/10/2021 0032   PHURINE 5.5 12/10/2021 0032   GLUCOSEU NEGATIVE 12/10/2021 0032   HGBUR LARGE (A) 12/10/2021 0032   BILIRUBINUR NEGATIVE 12/10/2021 0032   Benjamin Stain  NEGATIVE 12/10/2021 0032   PROTEINUR >300 (A) 12/10/2021 0032   UROBILINOGEN 0.2 08/23/2015 1555   NITRITE NEGATIVE 12/10/2021 0032   LEUKOCYTESUR SMALL (A) 12/10/2021 0032    Radiological Exams on Admission: CT ABDOMEN PELVIS WO CONTRAST  Result Date: 12/10/2021 CLINICAL DATA:  History of metastatic pancreatic carcinoma with abdominal pain and blood in nephrostomy catheters EXAM: CT ABDOMEN AND PELVIS WITHOUT CONTRAST TECHNIQUE: Multidetector CT imaging of the abdomen and pelvis was performed following the standard protocol without IV contrast. COMPARISON:  PET-CT from 11/26/2021 FINDINGS: Lower chest: Left-sided pleural effusion is noted. Multiple somewhat spiculated nodules are noted throughout the lung bases similar to that seen on the prior PET-CT consistent with metastatic disease. Hepatobiliary: Cysts are again seen within the liver stable from prior exams. The gallbladder has been surgically removed. Pancreas: Persistent soft tissue density is  noted along the pancreatic body adjacent to the known distal duodenal stent similar to that seen on prior PET-CT. It is incompletely evaluated due to lack of IV contrast. Spleen: Surgically removed Adrenals/Urinary Tract: Adrenal glands are within normal limits bilaterally. Left kidney demonstrates a nephrostomy catheter in place as well as a ureteral stent in place. No obstructive changes are seen. No renal calculi are noted. A small amount of air is noted within the left collecting system related to the nephrostomy catheter. Right kidney demonstrates a an upper pole cyst as well as evidence of hydronephrosis which extends to the level of urinary bladder. These changes are similar to that seen on prior PET-CT and distally a stone is again identified measuring approximately 7 mm. Cortical thinning in the right kidney is noted related to the longstanding obstruction. The bladder is decompressed. Stomach/Bowel: Hartmann's pouch is noted within the distal colon diverticular changes noted within the pouch. More proximal colon shows some mild retained fecal material. No inflammatory changes are seen. Mild diverticular changes noted. The appendix is within normal limits. A left-sided colostomy is again identified without obstructive change. Stomach is distended with fluid. Postsurgical changes are noted. There is a distal duodenal stent again identified. Small-bowel dilatation is noted in the proximal jejunum extending distally to an area of transition seen in the pelvis just to the left of the midline. This is noted on image number 62 of series 2 and image number 47 of series 5. This is likely related to adhesions. Some fecalization of bowel contents is noted. The more distal small bowel is within normal limits. Vascular/Lymphatic: Aortic atherosclerosis. No enlarged abdominal or pelvic lymph nodes. Reproductive: Prostate is unremarkable. Other: No abdominal wall hernia or abnormality. No abdominopelvic ascites.  Musculoskeletal: No acute bony abnormality is noted. Some persistent soft tissue density is noted along the anterior abdominal wall on the right on image number 52 of series 2 similar to that seen on prior PET-CT. IMPRESSION: Small-bowel dilatation in the jejunum and likely proximal ileum with a transition zone in the left hemipelvis likely related to adhesions. These changes are consistent with a partial small bowel obstruction. Changes consistent with left-sided pleural effusion and multiple pulmonary metastatic lesions. Fullness in the region of the pancreatic body similar to that seen on prior PET-CT but incompletely evaluated on this exam due to the lack of IV contrast. Left ureteral stent is noted in place. Left nephrostomy catheter is noted in the lower pole collecting system. No obstructive changes are seen. Persistent right-sided hydronephrosis and hydroureter is noted secondary to 7 mm distal right ureteral stone similar to that seen on the prior exam. Stable appearing  soft tissue nodules in the right anterior abdominal wall similar to prior PET-CT. Electronically Signed   By: Inez Catalina M.D.   On: 12/10/2021 00:06    EKG: None obtained in ED  Assessment/Plan Partial SBO     - placed in obs, med-surg     - general surgery onboard, defer plan to them     - currently NPO  Hyperkalemia     - fluids, check EKG  CKD3b Chronic obstructive nephropathy w/ left nephrostomy     - he's within baseline. He will be getting fluids. Monitor.     - good output on nephrostomy     - continue flomax when off NPO status  Pancreatic cancer w/ mets Hx of duodenal stricture, s/p stent Hx of colonic obstruction, s/p stent     - follows w/ Dr. Marin Olp. Not on chemo. Continue outpt follow up  HTN     - continue home regimen when off NPO status     - will have PRNs available  DVT prophylaxis: lovenox  Code Status: FULL  Family Communication: None at bedside  Consults called: General Surgery    Status is: Observation  The patient remains OBS appropriate and will d/c before 2 midnights.  Time spent coordinating admission: 75 minutes  [facility]  If 7PM-7AM, please contact night-coverage [email or web address]  12/10/2021, 11:31 AM

## 2021-12-10 NOTE — Plan of Care (Signed)
[facility] transfer note:  80 yo M with PSBO.  PMH: pancreatic CA, CBD stent, urostomy tube from renal failure.  In with abd pain, nausea, no vomiting.  CT shows PSBO  EDP spoke with gen surg who said theyd see in AM.  Will put in for med-surg bed.  [facility] will assume care on arrival to accepting facility. Until arrival, care as per EDP. However, [facility] available 24/7 for questions and assistance.  Nursing staff, please page [place] and Consults [phone]) as soon as the patient arrives the hospital.

## 2021-12-10 NOTE — ED Notes (Signed)
Patient updated on plan of care.  Comfort measures offered.  No needs expressed at this time

## 2021-12-10 NOTE — ED Notes (Signed)
No vomiting noted by Pt. On arrival or in room 2.

## 2021-12-10 NOTE — Consult Note (Signed)
Consult Note  Kevin Villegas 1941/01/20  [id number].    Requesting MD: Cherylann Ratel, DO Chief Complaint/Reason for Consult: PSBO HPI:  Patient is a 80 year old male with hx of multiple prior abdominal surgeries and metastatic pancreatic cancer who presented to [facility] yesterday with abdominal bloating and decreased colostomy output. He was concerned he may have another bowel blockage or that his duodenal stent might be collapsed. He also reports some abdominal pain that sounds chronic at site of met in RUQ. He saw Dr. Hassell Done about 1 month ago and they discussed that colostomy reversal was not recommended at that time. Patient reports he tried taking some miralax which sometimes helps but that he always goes back to having some degree of constipation. He denied nausea or vomiting, fever, chills, chest pain, SOB. He has a nephrostomy in place on the left. He is followed by Dr. Marin Olp with oncology and is not currently on any chemotherapy. He is not on any blood thinning medications. NKDA.   ROS: Review of Systems  Constitutional:  Negative for chills and fever.  Respiratory:  Negative for shortness of breath and wheezing.   Cardiovascular:  Negative for chest pain and palpitations.  Gastrointestinal:  Positive for abdominal pain and constipation. Negative for blood in stool, melena, nausea and vomiting.  Genitourinary:  Positive for hematuria.       Nephrostomy in place  All other systems reviewed and are negative.  Family History  Problem Relation Age of Onset   Stroke Mother     Past Medical History:  Diagnosis Date   Colostomy present [facility])    Large bowel obstruction ([place])    Pancreatic cancer [facility]     Past Surgical History:  Procedure Laterality Date   ABDOMINAL SURGERY     BIOPSY  05/20/2021   Procedure: BIOPSY;  Surgeon: Otis Brace, MD;  Location: [facility];  Service: Gastroenterology;;   BOWEL RESECTION     BOWEL RESECTION     CHOLECYSTECTOMY N/A  12/16/2014   Procedure: LAPAROSCOPIC CHOLECYSTECTOMY WITH INTRAOPERATIVE CHOLANGIOGRAM;  Surgeon: Georganna Skeans, MD;  Location: [place];  Service: General;  Laterality: N/A;   COLONIC STENT PLACEMENT N/A 05/26/2021   Procedure: COLONIC STENT PLACEMENT;  Surgeon: Clarene Essex, MD;  Location: [facility];  Service: Endoscopy;  Laterality: N/A;   CYSTOSCOPY W/ URETERAL STENT PLACEMENT Left 10/27/2021   Procedure: CYSTOSCOPY WITH RETROGRADE PYELOGRAM/URETERAL STENT PLACEMENT;  Surgeon: Raynelle Bring, MD;  Location: [facility];  Service: Urology;  Laterality: Left;   ESOPHAGOGASTRODUODENOSCOPY N/A 05/20/2021   Procedure: ESOPHAGOGASTRODUODENOSCOPY (EGD);  Surgeon: Otis Brace, MD;  Location: [facility];  Service: Gastroenterology;  Laterality: N/A;   EUS N/A 09/24/2015   Procedure: UPPER ENDOSCOPIC ULTRASOUND (EUS) LINEAR;  Surgeon: Milus Banister, MD;  [facility];  Service: Endoscopy;  Laterality: N/A;   FLEXIBLE SIGMOIDOSCOPY N/A 05/20/2021   Procedure: FLEXIBLE SIGMOIDOSCOPY;  Surgeon: Otis Brace, MD;  [facility];  Service: Gastroenterology;  Laterality: N/A;   FLEXIBLE SIGMOIDOSCOPY N/A 05/26/2021   Procedure: FLEXIBLE SIGMOIDOSCOPY;  Surgeon: Clarene Essex, MD;  [facility];  Service: Endoscopy;  Laterality: N/A;   IR NEPHROSTOMY PLACEMENT LEFT  11/11/2021   IR NEPHROSTOMY PLACEMENT RIGHT  11/11/2021   LAPAROSCOPIC DISTAL PANCREATECTOMY  10/2017   [facility]   LAPAROSCOPIC PARTIAL GASTRECTOMY  10/2017   [facility]   LAPAROSCOPIC SIGMOID COLECTOMY N/A 06/14/2021   Procedure: LAPAROSCOPY WITH MOBILIZATION, PROXIMAL DIVERTING COLOSTOMY, CREATION OF DISTAL MUCOUS FISTULA;  Surgeon: Johnathan Hausen, MD;  Location:  [facility];  Service: General;  Laterality: N/A;   LAPAROSCOPIC SPLENECTOMY  10/2017   [facility]   VASECTOMY      Social History:  reports that he has never smoked. He has never used smokeless tobacco. He reports that he does not drink alcohol and does not use  drugs.  Allergies:  Allergies  Allergen Reactions   Pollen Extract Other (See Comments)    Sinus congestion    (Not in a hospital admission)   Blood pressure 121/78, pulse 83, temperature 98.2 F (36.8 C), temperature source Oral, resp. rate 18, height 5' 11"  (1.803 m), weight 65.8 kg, SpO2 95 %. Physical Exam:  General: pleasant, WD, thin male who is laying in bed in NAD HEENT: head is normocephalic, atraumatic.  Sclera are anicteric.  Pupils equal and round.  Ears and nose without any masses or lesions.  Mouth is pink and moist Heart: regular, rate, and rhythm.  Normal s1,s2. No obvious murmurs, gallops, or rubs noted.  Palpable radial and pedal pulses bilaterally Lungs: CTAB, no wheezes, rhonchi, or rales noted.  Respiratory effort nonlabored Abd: soft, NT, ND, BS hypoactive, RUQ abdominal wall mass, LLQ colostomy viable with soft stool in bag MS: all 4 extremities are symmetrical with no cyanosis, clubbing, or edema. Skin: warm and dry with no masses, lesions, or rashes Neuro: Cranial nerves 2-12 grossly intact, sensation is normal throughout Psych: A&Ox3 with an appropriate affect.   Results for orders placed or performed during the hospital encounter of 12/09/21 (from the past 48 hour(s))  CBC with Differential     Status: Abnormal   Collection Time: 12/09/21 11:41 PM  Result Value Ref Range   WBC 13.5 (H) 4.0 - 10.5 K/uL   RBC 4.47 4.22 - 5.81 MIL/uL   Hemoglobin 13.4 13.0 - 17.0 g/dL   HCT 40.2 39.0 - 52.0 %   MCV 89.9 80.0 - 100.0 fL   MCH 30.0 26.0 - 34.0 pg   MCHC 33.3 30.0 - 36.0 g/dL   RDW 14.6 11.5 - 15.5 %   Platelets 607 (H) 150 - 400 K/uL   nRBC 0.0 0.0 - 0.2 %   Neutrophils Relative % 78 %   Neutro Abs 10.5 (H) 1.7 - 7.7 K/uL   Lymphocytes Relative 9 %   Lymphs Abs 1.2 0.7 - 4.0 K/uL   Monocytes Relative 12 %   Monocytes Absolute 1.6 (H) 0.1 - 1.0 K/uL   Eosinophils Relative 0 %   Eosinophils Absolute 0.1 0.0 - 0.5 K/uL   Basophils Relative 0 %    Basophils Absolute 0.1 0.0 - 0.1 K/uL   Immature Granulocytes 1 %   Abs Immature Granulocytes 0.07 0.00 - 0.07 K/uL    Comment: Performed at [facility], [address] [place], [place] [address]  Comprehensive metabolic panel     Status: Abnormal   Collection Time: 12/09/21 11:41 PM  Result Value Ref Range   Sodium 131 (L) 135 - 145 mmol/L   Potassium 4.9 3.5 - 5.1 mmol/L   Chloride 96 (L) 98 - 111 mmol/L   CO2 23 22 - 32 mmol/L   Glucose, Bld 114 (H) 70 - 99 mg/dL    Comment: Glucose reference range applies only to samples taken after fasting for at least 8 hours.   BUN 33 (H) 8 - 23 mg/dL   Creatinine, Ser 1.78 (H) 0.61 - 1.24 mg/dL   Calcium 9.1 8.9 - 10.3 mg/dL   Total Protein 7.8 6.5 - 8.1  g/dL   Albumin 3.1 (L) 3.5 - 5.0 g/dL   AST 73 (H) 15 - 41 U/L   ALT 55 (H) 0 - 44 U/L   Alkaline Phosphatase 85 38 - 126 U/L   Total Bilirubin 0.5 0.3 - 1.2 mg/dL   GFR, Estimated 38 (L) >60 mL/min    Comment: (NOTE) Calculated using the CKD-EPI Creatinine Equation (2021)    Anion gap 12 5 - 15    Comment: Performed at [facility], [address] [place], [place] [address]  Lipase, blood     Status: None   Collection Time: 12/09/21 11:41 PM  Result Value Ref Range   Lipase 43 11 - 51 U/L    Comment: Performed at [facility], [address] [place], [place] [address]  Urinalysis, Routine w reflex microscopic     Status: Abnormal   Collection Time: 12/10/21 12:32 AM  Result Value Ref Range   Color, Urine YELLOW YELLOW   APPearance CLOUDY (A) CLEAR   Specific Gravity, Urine 1.025 1.005 - 1.030   pH 5.5 5.0 - 8.0   Glucose, UA NEGATIVE NEGATIVE mg/dL   Hgb urine dipstick LARGE (A) NEGATIVE   Bilirubin Urine NEGATIVE NEGATIVE   Ketones, ur NEGATIVE NEGATIVE mg/dL   Protein, ur >300 (A) NEGATIVE mg/dL   Nitrite NEGATIVE NEGATIVE   Leukocytes,Ua SMALL (A) NEGATIVE    Comment: Performed at [facility], [address] [place],  [place] [address]  Resp Panel by RT-PCR (Flu A&B, Covid)     Status: None   Collection Time: 12/10/21 12:32 AM   Specimen: Nasopharyngeal(NP) swabs in vial transport medium  Result Value Ref Range   SARS Coronavirus 2 by RT PCR NEGATIVE NEGATIVE    Comment: (NOTE) SARS-CoV-2 target nucleic acids are NOT DETECTED.  The SARS-CoV-2 RNA is generally detectable in upper respiratory specimens during the acute phase of infection. The lowest concentration of SARS-CoV-2 viral copies this assay can detect is 138 copies/mL. A negative result does not preclude SARS-Cov-2 infection and should not be used as the sole basis for treatment or other patient management decisions. A negative result may occur with  improper specimen collection/handling, submission of specimen other than nasopharyngeal swab, presence of viral mutation(s) within the areas targeted by this assay, and inadequate number of viral copies(<138 copies/mL). A negative result must be combined with clinical observations, patient history, and epidemiological information. The expected result is Negative.  Fact Sheet for Patients:  EntrepreneurPulse.com.au  Fact Sheet for Healthcare Providers:  IncredibleEmployment.be  This test is no t yet approved or cleared by the [place] FDA and  has been authorized for detection and/or diagnosis of SARS-CoV-2 by FDA under an Emergency Use Authorization (EUA). This EUA will remain  in effect (meaning this test can be used) for the duration of the COVID-19 declaration under Section 564(b)(1) of the Act, 21 U.S.C.section 360bbb-3(b)(1), unless the authorization is terminated  or revoked sooner.       Influenza A by PCR NEGATIVE NEGATIVE   Influenza B by PCR NEGATIVE NEGATIVE    Comment: (NOTE) The Xpert Xpress SARS-CoV-2/FLU/RSV plus assay is intended as an aid in the diagnosis of influenza from Nasopharyngeal swab specimens and should not be used as a sole  basis for treatment. Nasal washings and aspirates are unacceptable for Xpert Xpress SARS-CoV-2/FLU/RSV testing.  Fact Sheet for Patients: EntrepreneurPulse.com.au  Fact Sheet for Healthcare Providers: IncredibleEmployment.be  This test is not yet approved or  cleared by the [place] and has been authorized for detection and/or diagnosis of SARS-CoV-2 by FDA under an Emergency Use Authorization (EUA). This EUA will remain in effect (meaning this test can be used) for the duration of the COVID-19 declaration under Section 564(b)(1) of the Act, 21 U.S.C. section 360bbb-3(b)(1), unless the authorization is terminated or revoked.  Performed at [facility], [address] [place], [place] [address]   Urinalysis, Microscopic (reflex)     Status: Abnormal   Collection Time: 12/10/21 12:32 AM  Result Value Ref Range   RBC / HPF >50 0 - 5 RBC/hpf   WBC, UA 21-50 0 - 5 WBC/hpf   Bacteria, UA MANY (A) NONE SEEN   Squamous Epithelial / LPF 0-5 0 - 5   Mucus PRESENT    Granular Casts, UA PRESENT     Comment: Performed at [facility], [address] [place], [place] [address]  I-stat chem 8, ED (not at [facility] or [facility])     Status: Abnormal   Collection Time: 12/10/21 12:36 AM  Result Value Ref Range   Sodium 134 (L) 135 - 145 mmol/L   Potassium 5.7 (H) 3.5 - 5.1 mmol/L   Chloride 101 98 - 111 mmol/L   BUN 41 (H) 8 - 23 mg/dL   Creatinine, Ser 1.90 (H) 0.61 - 1.24 mg/dL   Glucose, Bld 109 (H) 70 - 99 mg/dL    Comment: Glucose reference range applies only to samples taken after fasting for at least 8 hours.   Calcium, Ion 1.13 (L) 1.15 - 1.40 mmol/L   TCO2 28 22 - 32 mmol/L   Hemoglobin 13.6 13.0 - 17.0 g/dL   HCT 40.0 39.0 - 52.0 %   CT ABDOMEN PELVIS WO CONTRAST  Result Date: 12/10/2021 CLINICAL DATA:  History of metastatic pancreatic carcinoma with abdominal pain and blood in nephrostomy catheters EXAM: CT ABDOMEN AND  PELVIS WITHOUT CONTRAST TECHNIQUE: Multidetector CT imaging of the abdomen and pelvis was performed following the standard protocol without IV contrast. COMPARISON:  PET-CT from 11/26/2021 FINDINGS: Lower chest: Left-sided pleural effusion is noted. Multiple somewhat spiculated nodules are noted throughout the lung bases similar to that seen on the prior PET-CT consistent with metastatic disease. Hepatobiliary: Cysts are again seen within the liver stable from prior exams. The gallbladder has been surgically removed. Pancreas: Persistent soft tissue density is noted along the pancreatic body adjacent to the known distal duodenal stent similar to that seen on prior PET-CT. It is incompletely evaluated due to lack of IV contrast. Spleen: Surgically removed Adrenals/Urinary Tract: Adrenal glands are within normal limits bilaterally. Left kidney demonstrates a nephrostomy catheter in place as well as a ureteral stent in place. No obstructive changes are seen. No renal calculi are noted. A small amount of air is noted within the left collecting system related to the nephrostomy catheter. Right kidney demonstrates a an upper pole cyst as well as evidence of hydronephrosis which extends to the level of urinary bladder. These changes are similar to that seen on prior PET-CT and distally a stone is again identified measuring approximately 7 mm. Cortical thinning in the right kidney is noted related to the longstanding obstruction. The bladder is decompressed. Stomach/Bowel: Hartmann's pouch is noted within the distal colon diverticular changes noted within the pouch. More proximal colon shows some mild retained fecal material. No inflammatory changes are seen. Mild diverticular changes noted. The appendix is within normal limits. A left-sided colostomy is  again identified without obstructive change. Stomach is distended with fluid. Postsurgical changes are noted. There is a distal duodenal stent again identified. Small-bowel  dilatation is noted in the proximal jejunum extending distally to an area of transition seen in the pelvis just to the left of the midline. This is noted on image number 62 of series 2 and image number 47 of series 5. This is likely related to adhesions. Some fecalization of bowel contents is noted. The more distal small bowel is within normal limits. Vascular/Lymphatic: Aortic atherosclerosis. No enlarged abdominal or pelvic lymph nodes. Reproductive: Prostate is unremarkable. Other: No abdominal wall hernia or abnormality. No abdominopelvic ascites. Musculoskeletal: No acute bony abnormality is noted. Some persistent soft tissue density is noted along the anterior abdominal wall on the right on image number 52 of series 2 similar to that seen on prior PET-CT. IMPRESSION: Small-bowel dilatation in the jejunum and likely proximal ileum with a transition zone in the left hemipelvis likely related to adhesions. These changes are consistent with a partial small bowel obstruction. Changes consistent with left-sided pleural effusion and multiple pulmonary metastatic lesions. Fullness in the region of the pancreatic body similar to that seen on prior PET-CT but incompletely evaluated on this exam due to the lack of IV contrast. Left ureteral stent is noted in place. Left nephrostomy catheter is noted in the lower pole collecting system. No obstructive changes are seen. Persistent right-sided hydronephrosis and hydroureter is noted secondary to 7 mm distal right ureteral stone similar to that seen on the prior exam. Stable appearing soft tissue nodules in the right anterior abdominal wall similar to prior PET-CT. Electronically Signed   By: Inez Catalina M.D.   On: 12/10/2021 00:06      Assessment/Plan Hx of laparoscopic mobilization of colon and attempted mobilization of pelvis; colotomy and removal of metal stent sending it to path for exam of tissue, 24 foley venting mucous fistula; end colostomy for Distal sigmoid  colon obstruction with metal stent in place - Dr. Hassell Done - 06/14/21 PSBO - decreased output from colostomy, no nausea or vomiting - CT yesterday with transition in left hemipelvis and dilated jejunum and proximal ileum - pt refusing NGT placement at this time but agreeable to take PO gastrografin and bowel rest otherwise  - mobilize as tolerated - try to keep K > 4.0 and Mg > 2.0 to optimize bowel function  - no indications for emergent surgical intervention at this time, but we will follow with you  Hx of duodenal stricture for which he underwent stent placement 11/2019 Hx metastatic pancreatic cancer  - S/p distal pancreatectomy, splenectomy, and partial gastrectomy in 10/2017 at [facility] - duodenal stent appears in position and patent on CT yesterday  - Dr. Marin Olp following, pt not currently on chemotherapy  FEN: NPO, IVF per primary team VTE: ok to have LMWH or SQH from a surgical standpoint ID: no abx indicated from a surgical standpoint at this time  Left nephrostomy tube in place  CKD stage III Chronic constipation    Norm Parcel, [facility] Surgery 12/10/2021, 11:59 AM Please see Amion for pager number during day hours 7:00am-4:30pm

## 2021-12-10 NOTE — ED Notes (Signed)
[facility] ER charge RN called and report given.

## 2021-12-10 NOTE — ED Notes (Signed)
[facility] called for report, ETA 10 min. Pt stable for transfer

## 2021-12-10 NOTE — ED Notes (Signed)
[facility] at bedside to transfer pt  

## 2021-12-11 ENCOUNTER — Observation Stay (HOSPITAL_COMMUNITY): Payer: Medicare Other

## 2021-12-11 DIAGNOSIS — K56600 Partial intestinal obstruction, unspecified as to cause: Secondary | ICD-10-CM | POA: Diagnosis not present

## 2021-12-11 LAB — COMPREHENSIVE METABOLIC PANEL
ALT: 38 U/L (ref 0–44)
AST: 39 U/L (ref 15–41)
Albumin: 2.7 g/dL — ABNORMAL LOW (ref 3.5–5.0)
Alkaline Phosphatase: 74 U/L (ref 38–126)
Anion gap: 10 (ref 5–15)
BUN: 33 mg/dL — ABNORMAL HIGH (ref 8–23)
CO2: 22 mmol/L (ref 22–32)
Calcium: 8.4 mg/dL — ABNORMAL LOW (ref 8.9–10.3)
Chloride: 107 mmol/L (ref 98–111)
Creatinine, Ser: 1.81 mg/dL — ABNORMAL HIGH (ref 0.61–1.24)
GFR, Estimated: 38 mL/min — ABNORMAL LOW (ref >60)
Glucose, Bld: 88 mg/dL (ref 70–99)
Potassium: 4.4 mmol/L (ref 3.5–5.1)
Sodium: 139 mmol/L (ref 135–145)
Total Bilirubin: 1.2 mg/dL (ref 0.3–1.2)
Total Protein: 6.4 g/dL — ABNORMAL LOW (ref 6.5–8.1)

## 2021-12-11 LAB — CBC
HCT: 34.9 % — ABNORMAL LOW (ref 39.0–52.0)
Hemoglobin: 11.3 g/dL — ABNORMAL LOW (ref 13.0–17.0)
MCH: 30.1 pg (ref 26.0–34.0)
MCHC: 32.4 g/dL (ref 30.0–36.0)
MCV: 93.1 fL (ref 80.0–100.0)
Platelets: 553 10*3/uL — ABNORMAL HIGH (ref 150–400)
RBC: 3.75 MIL/uL — ABNORMAL LOW (ref 4.22–5.81)
RDW: 15.2 % (ref 11.5–15.5)
WBC: 21.7 10*3/uL — ABNORMAL HIGH (ref 4.0–10.5)
nRBC: 0 % (ref 0.0–0.2)

## 2021-12-11 MED ORDER — HEPARIN SOD (PORK) LOCK FLUSH 100 UNIT/ML IV SOLN
500.0000 [IU] | INTRAVENOUS | Status: DC | PRN
  Filled 2021-12-11: qty 5

## 2021-12-11 MED ORDER — POLYETHYLENE GLYCOL 3350 17 G PO PACK
17.0000 g | PACK | Freq: Every day | ORAL | Status: DC
  Administered 2021-12-11: 09:00:00 17 g via ORAL
  Filled 2021-12-11: qty 1

## 2021-12-11 MED ORDER — SODIUM CHLORIDE 0.9 % IV SOLN
1.0000 g | INTRAVENOUS | Status: DC
  Administered 2021-12-11: 15:00:00 1 g via INTRAVENOUS
  Filled 2021-12-11: qty 10

## 2021-12-11 MED ORDER — CEFADROXIL 500 MG PO CAPS
500.0000 mg | ORAL_CAPSULE | Freq: Two times a day (BID) | ORAL | 0 refills | Status: AC
Start: 2021-12-11 — End: 2021-12-21

## 2021-12-11 MED ORDER — POLYETHYLENE GLYCOL 3350 17 G PO PACK: 17.0000 g | PACK | Freq: Two times a day (BID) | ORAL | 0 refills | Status: AC

## 2021-12-11 NOTE — Progress Notes (Signed)
Transition of Care [facility]) Screening Note  Patient Details  Name: Kevin Villegas Date of Birth: 21-Oct-1941  Transition of Care [facility]) CM/SW Contact:    Sherie Don, LCSW Phone Number: 12/11/2021, 10:04 AM  Transition of Care Department [facility]) has reviewed patient and no TOC needs have been identified at this time. We will continue to monitor patient advancement through interdisciplinary progression rounds. If new patient transition needs arise, please place a TOC consult.

## 2021-12-11 NOTE — Progress Notes (Signed)
Pt AVS printed and educational teaching completed with teach back method. Port deaccessed. VSS. Pt received IV ABX before discharge. No further questions at this time.

## 2021-12-11 NOTE — Discharge Summary (Addendum)
Physician Discharge Summary  Kevin Villegas ZOX:[id number] DOB: [birth year] DOA: 12/09/2021  PCP: Dettinger, Kevin Kaufmann, MD  Admit date: 12/09/2021 Discharge date: 12/11/2021  Time spent: 40 minutes  Recommendations for Outpatient Follow-up:  Follow outpatient CBC/CMP Follow urine culture - ensure abx (duricef) appropriate - follow leukcytosis trend Follow right sided hydro with urology outpatient Follow effusion outpatient with oncology    Addendum: e. Coli sensitive to cefazolin, duricef appropriate.  He called [facility] floor today, he had first BM since discharge.  No n/v, on firmer side.  Encouraged continue miralax, increase to QID (which is what surgeons had recommended before) and he can back off when stools are looser -> aim for looser stools.  Recommended follow up other concerns with Dr. Marin Olp who he's seening today.  Discharge Diagnoses:  Principal Problem:   Partial small bowel obstruction [facility])   Discharge Condition: stable  Diet recommendation: soft diet  Filed Weights   12/09/21 2155  Weight: 65.8 kg    History of present illness:  No notes on file  Hospital Course:  Partial SBO     - resolved     - surgery signed off - continue daily bowel regimen - aim for looser stools     - continue soft diet at discharge   E. Coli UTI   Leukocytosis     - e. Coli with UTI     - susceptibilities pending, denies any fevers, chills or systemic symptoms -> will give dose of IV ceftriaxone and discharge on duricef.  Will need to follow final sensitivities, may need change in abx.        - ok for outpatient follow up, discussed with urology given chronic R hydro - recommended outpatient follow up      - leukocytosis impressive, but   Hyperkalemia     - resolved   CKD3b Chronic obstructive nephropathy w/ left nephrostomy     - he's within baseline. He will be getting fluids. Monitor.     - good output on nephrostomy     - continue flomax when off NPO status     - CT  findings with chronic right sided hydro 2/2 7 mm right ureteral stone - appears R sided nephrostomy tube was d/c'd in setting of lack of urine output from right nephrostomy tube 12/7.  Discussed with urology who noted ok for outpatient follow up (also discussed UTI above).   Pancreatic cancer w/ mets Hx of duodenal stricture, s/p stent Hx of colonic obstruction, s/p stent     - follows w/ Dr. Marin Olp. Not on chemo. Continue outpt follow up   Left Sided Effusion      - likely malignant, follow with oncology outpatient  HTN     -  resume home meds  Procedures: none   Consultations: Urology over the phone  Discharge Exam: Vitals:   12/11/21 0408 12/11/21 1424  BP: 127/76 130/76  Pulse: 90 87  Resp: 20 20  Temp: 98.6 F (37 C) 97.7 F (36.5 C)  SpO2: 94% 97%   Eager to discharge home Discussed discharge plan  General: No acute distress. Cardiovascular: RRR Lungs: unlabored Abdomen: ostomy with loose stool - L nephrostomy tube Neurological: Alert and oriented 3. Moves all extremities 4 . Cranial nerves II through XII grossly intact. Skin: Warm and dry. No rashes or lesions. Extremities: No clubbing or cyanosis. No edema.   Discharge Instructions   Discharge Instructions     Call MD for:  difficulty breathing, headache or  visual disturbances   Complete by: As directed    Call MD for:  extreme fatigue   Complete by: As directed    Call MD for:  hives   Complete by: As directed    Call MD for:  persistant dizziness or light-headedness   Complete by: As directed    Call MD for:  persistant nausea and vomiting   Complete by: As directed    Call MD for:  redness, tenderness, or signs of infection (pain, swelling, redness, odor or green/yellow discharge around incision site)   Complete by: As directed    Call MD for:  severe uncontrolled pain   Complete by: As directed    Call MD for:  temperature >100.4   Complete by: As directed    DIET SOFT   Complete by: As  directed    Discharge instructions   Complete by: As directed    You were seen for [name] partial small bowel obstruction.    This has resolved.  Continue miralax twice daily at discharge.  Aim to target looser stools (you can increase or decrease your miralax as needed to keep your stools looser).  Continue [name] soft diet for now.  You have [name] urinary tract infection.  You should follow this up with urology outpatient.  We'll give you [name] dose of antibiotics and then discharge you home with an antibiotic that usually covers common UTI bacteria.  Listen out for [name] call for me or your PCP regarding whether the antibiotics need to be changed or not.  IF YOU DEVELOP SYMPTOMS OF AN INFECTION -> FEVERS, CHILLS, FLANK PAIN, or other concerning symptoms, call your doctor or return to the ED.  You have fluid on your left chest which is likely related to the cancer.  Follow this with your PCP and oncologist.  You may need this fluid drained at some point.  Return for new, recurrent, or worsening symptoms.  Please ask your PCP to request records from this hospitalization so they know what was done and what the next steps will be.   Increase activity slowly   Complete by: As directed       Allergies as of 12/11/2021       Reactions   Pollen Extract Other (See Comments)   Sinus congestion        Medication List     TAKE these medications    acetaminophen 325 MG tablet Commonly known as: TYLENOL Take 650 mg by mouth every 6 (six) hours as needed for mild pain, fever or headache.   amLODipine 5 MG tablet Commonly known as: NORVASC Take 1 tablet (5 mg total) by mouth daily.   cefadroxil 500 MG capsule Commonly known as: DURICEF Take 1 capsule (500 mg total) by mouth 2 (two) times daily for 10 days.   LIDOCAINE-MENTHOL ROLL-ON EX Apply 1 application topically as needed (muscle pain).   polyethylene glycol 17 g packet Commonly known as: MIRALAX / GLYCOLAX Take 17 g by mouth daily.    tamsulosin 0.4 MG Caps capsule Commonly known as: FLOMAX Take 1 capsule (0.4 mg total) by mouth daily after supper.       Allergies  Allergen Reactions   Pollen Extract Other (See Comments)    Sinus congestion    Follow-up Information     Ardis Hughs, MD Follow up on 02/03/2022.   Specialty: Urology Why: at 1PM for MD visit. Contact information: [place] [address] [phone]  The results of significant diagnostics from this hospitalization (including imaging, microbiology, ancillary and laboratory) are listed below for reference.    Significant Diagnostic Studies: CT ABDOMEN PELVIS WO CONTRAST  Result Date: 12/10/2021 CLINICAL DATA:  History of metastatic pancreatic carcinoma with abdominal pain and blood in nephrostomy catheters EXAM: CT ABDOMEN AND PELVIS WITHOUT CONTRAST TECHNIQUE: Multidetector CT imaging of the abdomen and pelvis was performed following the standard protocol without IV contrast. COMPARISON:  PET-CT from 11/26/2021 FINDINGS: Lower chest: Left-sided pleural effusion is noted. Multiple somewhat spiculated nodules are noted throughout the lung bases similar to that seen on the prior PET-CT consistent with metastatic disease. Hepatobiliary: Cysts are again seen within the liver stable from prior exams. The gallbladder has been surgically removed. Pancreas: Persistent soft tissue density is noted along the pancreatic body adjacent to the known distal duodenal stent similar to that seen on prior PET-CT. It is incompletely evaluated due to lack of IV contrast. Spleen: Surgically removed Adrenals/Urinary Tract: Adrenal glands are within normal limits bilaterally. Left kidney demonstrates Clytie Shetley nephrostomy catheter in place as well as Satcha Storlie ureteral stent in place. No obstructive changes are seen. No renal calculi are noted. Padme Arriaga small amount of air is noted within the left collecting system related to the nephrostomy catheter. Right  kidney demonstrates Tashala Cumbo an upper pole cyst as well as evidence of hydronephrosis which extends to the level of urinary bladder. These changes are similar to that seen on prior PET-CT and distally Zaiyden Strozier stone is again identified measuring approximately 7 mm. Cortical thinning in the right kidney is noted related to the longstanding obstruction. The bladder is decompressed. Stomach/Bowel: Hartmann's pouch is noted within the distal colon diverticular changes noted within the pouch. More proximal colon shows some mild retained fecal material. No inflammatory changes are seen. Mild diverticular changes noted. The appendix is within normal limits. Britta Louth left-sided colostomy is again identified without obstructive change. Stomach is distended with fluid. Postsurgical changes are noted. There is Samah Lapiana distal duodenal stent again identified. Small-bowel dilatation is noted in the proximal jejunum extending distally to an area of transition seen in the pelvis just to the left of the midline. This is noted on image number 62 of series 2 and image number 47 of series 5. This is likely related to adhesions. Some fecalization of bowel contents is noted. The more distal small bowel is within normal limits. Vascular/Lymphatic: Aortic atherosclerosis. No enlarged abdominal or pelvic lymph nodes. Reproductive: Prostate is unremarkable. Other: No abdominal wall hernia or abnormality. No abdominopelvic ascites. Musculoskeletal: No acute bony abnormality is noted. Some persistent soft tissue density is noted along the anterior abdominal wall on the right on image number 52 of series 2 similar to that seen on prior PET-CT. IMPRESSION: Small-bowel dilatation in the jejunum and likely proximal ileum with Aydan Levitz transition zone in the left hemipelvis likely related to adhesions. These changes are consistent with Kassy Mcenroe partial small bowel obstruction. Changes consistent with left-sided pleural effusion and multiple pulmonary metastatic lesions. Fullness in the  region of the pancreatic body similar to that seen on prior PET-CT but incompletely evaluated on this exam due to the lack of IV contrast. Left ureteral stent is noted in place. Left nephrostomy catheter is noted in the lower pole collecting system. No obstructive changes are seen. Persistent right-sided hydronephrosis and hydroureter is noted secondary to 7 mm distal right ureteral stone similar to that seen on the prior exam. Stable appearing soft tissue nodules in the right anterior abdominal wall similar to  prior PET-CT. Electronically Signed   By: Inez Catalina M.D.   On: 12/10/2021 00:06   NM PET Image Initial (PI) Skull Base To Thigh  Result Date: 11/29/2021 CLINICAL DATA:  Subsequent treatment strategy for metastatic pancreatic carcinoma. Previous distal pancreatectomy. EXAM: NUCLEAR MEDICINE PET SKULL BASE TO THIGH TECHNIQUE: 7.8 mCi F-18 FDG was injected intravenously. Full-ring PET imaging was performed from the skull base to thigh after the radiotracer. CT data was obtained and used for attenuation correction and anatomic localization. Fasting blood glucose: 86 mg/dl COMPARISON:  Noncontrast AP CT on 11/09/2021 FINDINGS: Mediastinal blood-pool activity (background): SUV max = 2.3 Liver activity (reference): SUV max = N/Colen Eltzroth NECK:  No hypermetabolic lymph nodes or masses. Incidental CT findings:  None. CHEST: Numerous hypermetabolic pulmonary nodules are seen throughout both lungs, consistent with diffuse pulmonary metastases. Several show mild increase in size compared to those seen on prior abdomen CT. An index nodule in the posterior right lower lobe measures 2.1 x 1.9 cm on image 49/10, with SUV 5.9. Small left pleural effusion is increased in size. Numerous sites of FDG uptake are seen within left pleural space which correspond with areas of mild pleural thickening. Index lesion the anterior left pleural space has SUV max of 12.8, and these are consistent with diffuse pleural metastatic disease with  small malignant pleural effusion. Incidental CT findings:  None. ABDOMEN/PELVIS: Soft tissue mass in the area of the pancreatic body along the medial aspect of the duodenal stent currently measures 3.3 x 2.7 cm on image 129/4, slightly decreased from 3.8 x 3.0 cm previously. This shows hypermetabolic activity, with SUV max of 9.9, consistent with recurrent carcinoma. No abnormal hypermetabolic activity within the liver, adrenal glands, or spleen. No hypermetabolic lymph nodes in the abdomen or pelvis. Poorly defined soft tissue density is seen in the right anterior abdominal wall measuring 2.4 x 2.2 cm on image 139/, which is hypermetabolic, with SUV max of 7 6. Wendelyn Kiesling smaller asymmetric soft tissue density is seen in the right lower quadrant abdominal wall soft tissues measuring 1.9 x 1.5 cm on image 154/4, which also shows FDG uptake, with SUV max of 3.6. These are not significantly changed compared to recent CT, and consistent with abdominal wall metastases. Incidental CT findings: Several small fluid attenuation hepatic cysts are again seen, which show no FDG uptake. Chronic right renal parenchymal atrophy and hydroureteronephrosis show no significant change, with persistent 5 mm calculus in the distal right ureter. New left percutaneous nephrostomy tube is seen in appropriate position, and double pigtail internal left ureteral stent remains in place, with resolution of left hydronephrosis since prior exam. Stable mildly enlarged prostate. SKELETON: No focal hypermetabolic bone lesions to suggest skeletal metastasis. Incidental CT findings:  None. IMPRESSION: Diffuse bilateral pulmonary metastases, some in the lung bases showing mild increase in size compared to recent abdomen CT. Increased size of small malignant left pleural effusion, with diffuse pleural metastatic disease. Slight decrease in size of hypermetabolic soft tissue mass in region of pancreatic body, consistent with recurrent carcinoma. Stable size of 2  small hypermetabolic soft tissue densities in the right anterior abdominal wall soft tissues, consistent with metastatic disease. Resolution of left hydronephrosis following percutaneous nephrostomy tube placement. Electronically Signed   By: Marlaine Hind M.D.   On: 11/29/2021 09:19   DG Abd Portable 1V-Small Bowel Obstruction Protocol-24 hr delay  Result Date: 12/11/2021 CLINICAL DATA:  Small bowel obstruction EXAM: PORTABLE ABDOMEN - 1 VIEW COMPARISON:  12/10/2021 FINDINGS: Left nephrostomy catheter and  ureteral stent again noted, unchanged. Small bowel stent in the left abdomen. Continued mild gaseous distention of small bowel in the right abdomen, but improved since prior study. Oral contrast material seen within the colon and the ostomy bag. IMPRESSION: Mild right abdominal small bowel distension, overall improved since prior study. Contrast seen within the colon and ostomy bag. Electronically Signed   By: Rolm Baptise M.D.   On: 12/11/2021 06:34   DG Abd Portable 1V-Small Bowel Obstruction Protocol-initial, 8 hr delay  Result Date: 12/10/2021 CLINICAL DATA:  8 hour delay bowel obstruction EXAM: PORTABLE ABDOMEN - 1 VIEW COMPARISON:  10/13/2021, CT 12/09/2021 FINDINGS: Small left-sided pleural effusion and basilar airspace disease. Stent in the left upper quadrant. Persistent dilatation of central small bowel up to 5.1 cm. Enteral contrast within the colon and probably the left lower quadrant colostomy. Left-sided ureteral stent and nephrostomy tubes. IMPRESSION: Persistent central small bowel distension but with enteral contrast noted in the colon. Small left effusion with left basilar atelectasis or pneumonia Electronically Signed   By: Donavan Foil M.D.   On: 12/10/2021 20:41    Microbiology: Recent Results (from the past 240 hour(s))  Resp Panel by RT-PCR (Flu Tam Delisle&B, Covid)     Status: None   Collection Time: 12/10/21 12:32 AM   Specimen: Nasopharyngeal(NP) swabs in vial transport medium   Result Value Ref Range Status   SARS Coronavirus 2 by RT PCR NEGATIVE NEGATIVE Final    Comment: (NOTE) SARS-CoV-2 target nucleic acids are NOT DETECTED.  The SARS-CoV-2 RNA is generally detectable in upper respiratory specimens during the acute phase of infection. The lowest concentration of SARS-CoV-2 viral copies this assay can detect is 138 copies/mL. Inocente Krach negative result does not preclude SARS-Cov-2 infection and should not be used as the sole basis for treatment or other patient management decisions. Jaece Ducharme negative result may occur with  improper specimen collection/handling, submission of specimen other than nasopharyngeal swab, presence of viral mutation(s) within the areas targeted by this assay, and inadequate number of viral copies(<138 copies/mL). Rollin Kotowski negative result must be combined with clinical observations, patient history, and epidemiological information. The expected result is Negative.  Fact Sheet for Patients:  EntrepreneurPulse.com.au  Fact Sheet for Healthcare Providers:  IncredibleEmployment.be  This test is no t yet approved or cleared by the [place] FDA and  has been authorized for detection and/or diagnosis of SARS-CoV-2 by FDA under an Emergency Use Authorization (EUA). This EUA will remain  in effect (meaning this test can be used) for the duration of the COVID-19 declaration under Section 564(b)(1) of the Act, 21 U.S.C.section 360bbb-3(b)(1), unless the authorization is terminated  or revoked sooner.       Influenza Taja Pentland by PCR NEGATIVE NEGATIVE Final   Influenza B by PCR NEGATIVE NEGATIVE Final    Comment: (NOTE) The Xpert Xpress SARS-CoV-2/FLU/RSV plus assay is intended as an aid in the diagnosis of influenza from Nasopharyngeal swab specimens and should not be used as Loyd Salvador sole basis for treatment. Nasal washings and aspirates are unacceptable for Xpert Xpress SARS-CoV-2/FLU/RSV testing.  Fact Sheet for  Patients: EntrepreneurPulse.com.au  Fact Sheet for Healthcare Providers: IncredibleEmployment.be  This test is not yet approved or cleared by the [place] FDA and has been authorized for detection and/or diagnosis of SARS-CoV-2 by FDA under an Emergency Use Authorization (EUA). This EUA will remain in effect (meaning this test can be used) for the duration of the COVID-19 declaration under Section 564(b)(1) of the Act, 21 U.S.C. section 360bbb-3(b)(1), unless  the authorization is terminated or revoked.  Performed at [facility], [address] [place], [place] [address]   Urine Culture     Status: Abnormal (Preliminary result)   Collection Time: 12/10/21  4:39 PM   Specimen: Urine, Clean Catch  Result Value Ref Range Status   Specimen Description   Final    URINE, CLEAN CATCH Performed at [facility], [place] [address], [place], [place] [address]    Special Requests   Final    NONE Performed at [facility], [place] [address], [place],  [address]    Culture (Aldora Perman)  Final    >=100,000 COLONIES/mL ESCHERICHIA COLI SUSCEPTIBILITIES TO FOLLOW Performed at [facility], [place] [address], [place],  [address]    Report Status PENDING  Incomplete     Labs: Basic Metabolic Panel: Recent Labs  Lab 12/09/21 2341 12/10/21 0036 12/10/21 1307 12/11/21 0519  NA 131* 134* 135 139  K 4.9 5.7* 4.7 4.4  CL 96* 101 102 107  CO2 23  --  22 22  GLUCOSE 114* 109* 92 88  BUN 33* 41* 33* 33*  CREATININE 1.78* 1.90* 1.77* 1.81*  CALCIUM 9.1  --  8.4* 8.4*   Liver Function Tests: Recent Labs  Lab 12/09/21 2341 12/10/21 1307 12/11/21 0519  AST 73* 50* 39  ALT 55* 43 38  ALKPHOS 85 81 74  BILITOT 0.5 1.1 1.2  PROT 7.8 6.9 6.4*  ALBUMIN 3.1* 2.9* 2.7*   Recent Labs  Lab 12/09/21 2341  LIPASE 43   No results for input(s): AMMONIA in the last 168 hours. CBC: Recent Labs  Lab  12/09/21 2341 12/10/21 0036 12/10/21 1307 12/11/21 0519  WBC 13.5*  --  20.7* 21.7*  NEUTROABS 10.5*  --  17.1*  --   HGB 13.4 13.6 12.8* 11.3*  HCT 40.2 40.0 39.5 34.9*  MCV 89.9  --  92.3 93.1  PLT 607*  --  637* 553*   Cardiac Enzymes: No results for input(s): CKTOTAL, CKMB, CKMBINDEX, TROPONINI in the last 168 hours. BNP: BNP (last 3 results) No results for input(s): BNP in the last 8760 hours.  ProBNP (last 3 results) No results for input(s): PROBNP in the last 8760 hours.  CBG: No results for input(s): GLUCAP in the last 168 hours.     Signed:  Fayrene Helper MD.  [facility] 12/11/2021, 3:19 PM

## 2021-12-12 LAB — URINE CULTURE: Culture: >=100000 — AB

## 2021-12-14 ENCOUNTER — Other Ambulatory Visit: Payer: Self-pay

## 2021-12-14 ENCOUNTER — Inpatient Hospital Stay: Payer: Medicare Other | Admitting: Hematology & Oncology

## 2021-12-14 ENCOUNTER — Inpatient Hospital Stay: Payer: Medicare Other

## 2021-12-14 ENCOUNTER — Encounter: Payer: Self-pay | Admitting: *Deleted

## 2021-12-14 NOTE — Progress Notes (Signed)
Patient was due to be seen today in follow up, however his wife is Covid positive and in the ED. He felt it best to reschedule due to his exposure and possible infection. Of note, he was recently discharged from the hospital for PBSO and UTI.   Oncology Nurse Navigator Documentation  Oncology Nurse Navigator Flowsheets 12/14/2021  Navigator Follow Up Date: 01/10/2022  Navigator Follow Up Reason: Other:  Navigator Location [facility]  Navigator Encounter Type Appt/Treatment Plan Review  Telephone -  Patient Visit Type MedOnc  Treatment Phase Active Tx  Barriers/Navigation Needs Coordination of Care;Education  Education -  Interventions None Required  Acuity Level 2-Minimal Needs (1-2 Barriers Identified)  Coordination of Care -  Education Method -  Support Groups/Services Friends and Family  Time Spent with Patient 15

## 2021-12-15 ENCOUNTER — Encounter: Payer: Self-pay | Admitting: *Deleted

## 2021-12-15 ENCOUNTER — Ambulatory Visit (INDEPENDENT_AMBULATORY_CARE_PROVIDER_SITE_OTHER): Payer: Medicare Other | Admitting: Nurse Practitioner

## 2021-12-15 ENCOUNTER — Encounter: Payer: Self-pay | Admitting: Nurse Practitioner

## 2021-12-15 DIAGNOSIS — U071 COVID-19: Secondary | ICD-10-CM | POA: Diagnosis not present

## 2021-12-15 MED ORDER — MOLNUPIRAVIR EUA 200MG CAPSULE: 4.0000 | ORAL_CAPSULE | Freq: Two times a day (BID) | ORAL | 0 refills | Status: AC

## 2021-12-15 NOTE — Progress Notes (Signed)
Virtual Visit  Note Due to COVID-19 pandemic this visit was conducted virtually. This visit type was conducted due to national recommendations for restrictions regarding the COVID-19 Pandemic (e.g. social distancing, sheltering in place) in an effort to limit this patient's exposure and mitigate transmission in our community. All issues noted in this document were discussed and addressed.  A physical exam was not performed with this format.  I connected with Kevin Villegas on 12/15/21 at 12:20 by telephone and verified that I am speaking with the correct person using two identifiers. Kevin Villegas is currently located at home and his wife is currently with him during visit. The provider, Mary-Margaret Hassell Done, FNP is located in their office at time of visit.  I discussed the limitations, risks, security and privacy concerns of performing an evaluation and management service by telephone and the availability of in person appointments. I also discussed with the patient that there may be a patient responsible charge related to this service. The patient expressed understanding and agreed to proceed.   History and Present Illness:  URI  This is a new problem. The current episode started yesterday. The problem has been gradually worsening. There has been no fever. The fever has been present for Less than 1 day. Associated symptoms include congestion (mild), coughing (mild) and rhinorrhea. He has tried nothing for the symptoms. Took his wife to ed yesterday and she tested positive for covid. He did home tes this morning on hisself and was positive.    Review of Systems  HENT:  Positive for congestion (mild) and rhinorrhea.   Respiratory:  Positive for cough (mild).     Observations/Objective: Alert and oriented- answers all questions appropriately No distress No cough during visit  Assessment and Plan: Kevin Villegas in today with chief complaint of covid positive  1. Lab test positive for  detection of COVID-19 virus 1. Take meds as prescribed 2. Use a cool mist humidifier especially during the winter months and when heat has been humid. 3. Use saline nose sprays frequently 4. Saline irrigations of the nose can be very helpful if done frequently.  * 4X daily for 1 week*  * Use of a nettie pot can be helpful with this. Follow directions with this* 5. Drink plenty of fluids 6. Keep thermostat turn down low 7.For any cough or congestion- mucinex  8. For fever or aces or pains- take tylenol or ibuprofen appropriate for age and weight.  * for fevers greater than 101 orally you may alternate ibuprofen and tylenol every  3 hours.   Meds ordered this encounter  Medications   molnupiravir EUA (LAGEVRIO) 200 mg CAPS capsule    Sig: Take 4 capsules (800 mg total) by mouth 2 (two) times daily for 5 days.    Dispense:  40 capsule    Refill:  0    Order Specific Question:   Supervising Provider    Answer:   Caryl Pina A [[id number]]        Follow Up Instructions: prn    I discussed the assessment and treatment plan with the patient. The patient was provided an opportunity to ask questions and all were answered. The patient agreed with the plan and demonstrated an understanding of the instructions.   The patient was advised to call back or seek an in-person evaluation if the symptoms worsen or if the condition fails to improve as anticipated.  The above assessment and management plan was discussed with the patient. The patient verbalized understanding of  and has agreed to the management plan. Patient is aware to call the clinic if symptoms persist or worsen. Patient is aware when to return to the clinic for a follow-up visit. Patient educated on when it is appropriate to go to the emergency department.   Time call ended:  12:35  I provided 15 minutes of  non face-to-face time during this encounter.    Mary-Margaret Hassell Done, FNP

## 2021-12-15 NOTE — Patient Instructions (Signed)
1. Take meds as prescribed 2. Use a cool mist humidifier especially during the winter months and when heat has been humid. 3. Use saline nose sprays frequently 4. Saline irrigations of the nose can be very helpful if done frequently.  * 4X daily for 1 week*  * Use of a nettie pot can be helpful with this. Follow directions with this* 5. Drink plenty of fluids 6. Keep thermostat turn down low 7.For any cough or congestion- mucinex 8. For fever or aces or pains- take tylenol or ibuprofen appropriate for age and weight.  * for fevers greater than 101 orally you may alternate ibuprofen and tylenol every  3 hours.    

## 2021-12-15 NOTE — Progress Notes (Signed)
Patient called to state that he has tested positive for Covid on a home test, after his wife was seen in the ED, with a positive test, on 12/14/2021. He would like to know if Dr Marin Olp will prescribe the same antiviral medication that his wife was given.   Informed patient that Dr Marin Olp is not prescribing antivirals for covid as he is not familiar enough with dosing, contraindications etc and he needs to call his PCP. Patient agreed to call his PCP.   Oncology Nurse Navigator Documentation  Oncology Nurse Navigator Flowsheets 12/15/2021  Navigator Follow Up Date: 01/10/2022  Navigator Follow Up Reason: Other:  Navigator Location [facility]  Navigator Encounter Type Telephone  Telephone Symptom Mgt;Incoming Call  Patient Visit Type MedOnc  Treatment Phase Active Tx  Barriers/Navigation Needs Coordination of Care;Education  Education Other  Interventions Education;Psycho-Social Support  Acuity Level 2-Minimal Needs (1-2 Barriers Identified)  Coordination of Care -  Education Method Verbal  Support Groups/Services Friends and Family  Time Spent with Patient 15

## 2021-12-16 ENCOUNTER — Telehealth: Payer: Medicare Other | Admitting: Nurse Practitioner

## 2021-12-23 ENCOUNTER — Other Ambulatory Visit: Payer: Self-pay

## 2021-12-23 ENCOUNTER — Other Ambulatory Visit (HOSPITAL_COMMUNITY): Payer: Self-pay | Admitting: Interventional Radiology

## 2021-12-23 ENCOUNTER — Ambulatory Visit (HOSPITAL_COMMUNITY)
Admission: RE | Admit: 2021-12-23 | Discharge: 2021-12-23 | Disposition: A | Discharge status: Home / Self Care | Payer: Medicare Other | Source: Ambulatory Visit | Attending: Urology | Admitting: Urology

## 2021-12-23 ENCOUNTER — Telehealth: Payer: Self-pay | Admitting: Family Medicine

## 2021-12-23 DIAGNOSIS — C252 Malignant neoplasm of tail of pancreas: Secondary | ICD-10-CM

## 2021-12-23 DIAGNOSIS — Z436 Encounter for attention to other artificial openings of urinary tract: Secondary | ICD-10-CM | POA: Diagnosis present

## 2021-12-23 HISTORY — PX: IR NEPHROSTOMY EXCHANGE LEFT: IMG6069

## 2021-12-23 MED ORDER — IOHEXOL 300 MG/ML  SOLN
20.0000 mL | Freq: Once | INTRAMUSCULAR | Status: AC | PRN
  Administered 2021-12-23: 5 mL

## 2021-12-23 MED ORDER — LIDOCAINE HCL 1 % IJ SOLN
INTRAMUSCULAR | Status: AC
  Filled 2021-12-23: qty 20

## 2021-12-23 NOTE — Telephone Encounter (Signed)
°  Left message for patient to call back and schedule Medicare Annual Wellness Visit (AWV) to be completed by video or phone.  No hx of AWV eligible for AWVI as of 12/19/2009 per [facility]    Please schedule at anytime with [place] --- Karle Starch  45 Minutes appointment   Any questions, please call me at [phone]

## 2021-12-31 ENCOUNTER — Encounter: Payer: Self-pay | Admitting: Family Medicine

## 2021-12-31 ENCOUNTER — Ambulatory Visit (INDEPENDENT_AMBULATORY_CARE_PROVIDER_SITE_OTHER): Payer: Medicare Other | Admitting: Family Medicine

## 2021-12-31 VITALS — BP 113/82 | HR 55 | Ht 71.0 in | Wt 140.0 lb

## 2021-12-31 DIAGNOSIS — E43 Unspecified severe protein-calorie malnutrition: Secondary | ICD-10-CM | POA: Diagnosis not present

## 2021-12-31 DIAGNOSIS — N1831 Chronic kidney disease, stage 3a: Secondary | ICD-10-CM | POA: Diagnosis not present

## 2021-12-31 DIAGNOSIS — C44509 Unspecified malignant neoplasm of skin of other part of trunk: Secondary | ICD-10-CM | POA: Diagnosis not present

## 2021-12-31 DIAGNOSIS — I10 Essential (primary) hypertension: Secondary | ICD-10-CM | POA: Diagnosis not present

## 2021-12-31 MED ORDER — MIRTAZAPINE 15 MG PO TABS: 15.0000 mg | ORAL_TABLET | Freq: Every day | ORAL | 1 refills | Status: DC

## 2021-12-31 NOTE — Addendum Note (Signed)
Addended by: Caryl Pina on: 12/31/2021 11:24 AM   Modules accepted: Orders

## 2021-12-31 NOTE — Progress Notes (Signed)
BP 113/82    Pulse (!) 55    Ht 5' 11"  (1.803 m)    Wt 140 lb (63.5 kg)    BMI 19.53 kg/m    Subjective:   Patient ID: Kevin Villegas, male    DOB: Apr 30, 1941, 81 y.o.   MRN: [id number]  HPI: Kevin Villegas is a 81 y.o. male presenting on 12/31/2021 for No chief complaint on file.   HPI Hypertension Patient is currently on no medicine currently, and their blood pressure today is 113/82. Patient denies any lightheadedness or dizziness. Patient denies headaches, blurred vision, chest pains, shortness of breath, or weakness. Denies any side effects from medication and is content with current medication.   CKD 3 Patient is coming in for recheck of CKD 3, will do blood work today and see where he is at and see if he is stable.  Patient has pancreatic cancer with malignant neoplasm of the abdominal wall and lower lungs according to the PET scan.  Relevant past medical, surgical, family and social history reviewed and updated as indicated. Interim medical history since our last visit reviewed. Allergies and medications reviewed and updated.  Review of Systems  Constitutional:  Negative for chills and fever.  Eyes:  Negative for visual disturbance.  Respiratory:  Negative for shortness of breath and wheezing.   Cardiovascular:  Negative for chest pain and leg swelling.  Gastrointestinal:  Positive for abdominal pain. Negative for abdominal distention.  Musculoskeletal:  Negative for arthralgias, back pain and gait problem.  Skin:  Negative for rash.  All other systems reviewed and are negative.  Per HPI unless specifically indicated above   Allergies as of 12/31/2021       Reactions   Pollen Extract Other (See Comments)   Sinus congestion        Medication List        Accurate as of December 31, 2021 11:06 AM. If you have any questions, ask your nurse or doctor.          STOP taking these medications    amLODipine 5 MG tablet Commonly known as: NORVASC Stopped by:  [name] Tashon Capp, MD   LIDOCAINE-MENTHOL ROLL-ON EX Stopped by: [name] Arnoldo Hildreth, MD       TAKE these medications    acetaminophen 325 MG tablet Commonly known as: TYLENOL Take 650 mg by mouth every 6 (six) hours as needed for mild pain, fever or headache.         Objective:   BP 113/82    Pulse (!) 55    Ht 5' 11"  (1.803 m)    Wt 140 lb (63.5 kg)    BMI 19.53 kg/m   Wt Readings from Last 3 Encounters:  12/31/21 140 lb (63.5 kg)  12/09/21 145 lb (65.8 kg)  11/18/21 146 lb (66.2 kg)    Physical Exam Vitals and nursing note reviewed.  Constitutional:      General: He is not in acute distress.    Appearance: He is well-developed. He is not diaphoretic.  Eyes:     General: No scleral icterus.    Conjunctiva/sclera: Conjunctivae normal.  Neck:     Thyroid: No thyromegaly.  Cardiovascular:     Rate and Rhythm: Normal rate and regular rhythm.     Heart sounds: Normal heart sounds. No murmur heard. Pulmonary:     Effort: Pulmonary effort is normal. No respiratory distress.     Breath sounds: Normal breath sounds. No wheezing.  Abdominal:  Comments: For centimeter lesion on right abdominal wall does not appear to be mobile and is slightly tender.  In accordance with PET scan that he just had in December it does look like malignant neoplasm.  Musculoskeletal:        General: Normal range of motion.     Cervical back: Neck supple.  Lymphadenopathy:     Cervical: No cervical adenopathy.  Skin:    General: Skin is warm and dry.     Findings: No rash.  Neurological:     Mental Status: He is alert and oriented to person, place, and time.     Coordination: Coordination normal.  Psychiatric:        Behavior: Behavior normal.      Assessment & Plan:   Problem List Items Addressed This Visit       Cardiovascular and Mediastinum   Essential hypertension - Primary   Relevant Orders   CBC with Differential/Platelet   CMP14+EGFR   Lipid panel      Genitourinary   Stage 3a chronic kidney disease (CKD) ([place])   Relevant Orders   CBC with Differential/Platelet   CMP14+EGFR   Lipid panel   Other Visit Diagnoses     Malignant neoplasm of abdominal wall           Patient is going to call her surgeon back and see about the spot on his abdominal wall that is causing tenderness.  No change in medication, will do blood work Follow up plan: Return in about 6 months (around 06/30/2022), or if symptoms worsen or fail to improve, for Hypertension and CKD recheck.  Counseling provided for all of the vaccine components Orders Placed This Encounter  Procedures   CBC with Differential/Platelet   CMP14+EGFR   Lipid panel    Caryl Pina, MD [facility] 12/31/2021, 11:06 AM

## 2022-01-01 ENCOUNTER — Encounter: Payer: Self-pay | Admitting: Hematology & Oncology

## 2022-01-01 LAB — CBC WITH DIFFERENTIAL/PLATELET
Basophils Absolute: 0.1 10*3/uL (ref 0.0–0.2)
Basos: 1 %
EOS (ABSOLUTE): 0.1 10*3/uL (ref 0.0–0.4)
Eos: 1 %
Hematocrit: 39.1 % (ref 37.5–51.0)
Hemoglobin: 12.6 g/dL — ABNORMAL LOW (ref 13.0–17.7)
Immature Grans (Abs): 0 10*3/uL (ref 0.0–0.1)
Immature Granulocytes: 0 %
Lymphocytes Absolute: 1.1 10*3/uL (ref 0.7–3.1)
Lymphs: 10 %
MCH: 29.1 pg (ref 26.6–33.0)
MCHC: 32.2 g/dL (ref 31.5–35.7)
MCV: 90 fL (ref 79–97)
Monocytes Absolute: 1.4 10*3/uL — ABNORMAL HIGH (ref 0.1–0.9)
Monocytes: 13 %
Neutrophils Absolute: 8.3 10*3/uL — ABNORMAL HIGH (ref 1.4–7.0)
Neutrophils: 75 %
Platelets: 539 10*3/uL — ABNORMAL HIGH (ref 150–450)
RBC: 4.33 x10E6/uL (ref 4.14–5.80)
RDW: 14.8 % (ref 11.6–15.4)
WBC: 11 10*3/uL — ABNORMAL HIGH (ref 3.4–10.8)

## 2022-01-01 LAB — CMP14+EGFR
ALT: 40 IU/L (ref 0–44)
AST: 45 IU/L — ABNORMAL HIGH (ref 0–40)
Albumin/Globulin Ratio: 1.2 (ref 1.2–2.2)
Albumin: 3.9 g/dL (ref 3.7–4.7)
Alkaline Phosphatase: 83 IU/L (ref 44–121)
BUN/Creatinine Ratio: 22 (ref 10–24)
BUN: 38 mg/dL — ABNORMAL HIGH (ref 8–27)
Bilirubin Total: 0.3 mg/dL (ref 0.0–1.2)
CO2: 24 mmol/L (ref 20–29)
Calcium: 9.4 mg/dL (ref 8.6–10.2)
Chloride: 102 mmol/L (ref 96–106)
Creatinine, Ser: 1.73 mg/dL — ABNORMAL HIGH (ref 0.76–1.27)
Globulin, Total: 3.3 g/dL (ref 1.5–4.5)
Glucose: 100 mg/dL — ABNORMAL HIGH (ref 70–99)
Potassium: 5.6 mmol/L — ABNORMAL HIGH (ref 3.5–5.2)
Sodium: 139 mmol/L (ref 134–144)
Total Protein: 7.2 g/dL (ref 6.0–8.5)
eGFR: 39 mL/min/{1.73_m2} — ABNORMAL LOW (ref >59)

## 2022-01-01 LAB — LIPID PANEL
Chol/HDL Ratio: 2.5 ratio (ref 0.0–5.0)
Cholesterol, Total: 149 mg/dL (ref 100–199)
HDL: 60 mg/dL (ref >39)
LDL Chol Calc (NIH): 70 mg/dL (ref 0–99)
Triglycerides: 105 mg/dL (ref 0–149)
VLDL Cholesterol Cal: 19 mg/dL (ref 5–40)

## 2022-01-06 ENCOUNTER — Ambulatory Visit: Payer: Medicare Other | Admitting: Family Medicine

## 2022-01-07 ENCOUNTER — Inpatient Hospital Stay (HOSPITAL_COMMUNITY): Payer: Medicare Other

## 2022-01-07 ENCOUNTER — Inpatient Hospital Stay (HOSPITAL_BASED_OUTPATIENT_CLINIC_OR_DEPARTMENT_OTHER)
Admission: EM | Admit: 2022-01-07 | Discharge: 2022-01-10 | DRG: 356 | Disposition: A | Discharge status: Home / Self Care | Payer: Medicare Other | Attending: Family Medicine | Admitting: Family Medicine

## 2022-01-07 ENCOUNTER — Encounter (HOSPITAL_BASED_OUTPATIENT_CLINIC_OR_DEPARTMENT_OTHER): Payer: Self-pay | Admitting: Emergency Medicine

## 2022-01-07 ENCOUNTER — Emergency Department (HOSPITAL_BASED_OUTPATIENT_CLINIC_OR_DEPARTMENT_OTHER): Payer: Medicare Other

## 2022-01-07 ENCOUNTER — Other Ambulatory Visit: Payer: Self-pay

## 2022-01-07 DIAGNOSIS — Z933 Colostomy status: Secondary | ICD-10-CM

## 2022-01-07 DIAGNOSIS — J90 Pleural effusion, not elsewhere classified: Secondary | ICD-10-CM | POA: Diagnosis present

## 2022-01-07 DIAGNOSIS — C7800 Secondary malignant neoplasm of unspecified lung: Secondary | ICD-10-CM | POA: Diagnosis present

## 2022-01-07 DIAGNOSIS — Z91048 Other nonmedicinal substance allergy status: Secondary | ICD-10-CM

## 2022-01-07 DIAGNOSIS — K56600 Partial intestinal obstruction, unspecified as to cause: Secondary | ICD-10-CM | POA: Diagnosis present

## 2022-01-07 DIAGNOSIS — N1330 Unspecified hydronephrosis: Secondary | ICD-10-CM | POA: Diagnosis present

## 2022-01-07 DIAGNOSIS — K56609 Unspecified intestinal obstruction, unspecified as to partial versus complete obstruction: Secondary | ICD-10-CM

## 2022-01-07 DIAGNOSIS — Z903 Acquired absence of stomach [part of]: Secondary | ICD-10-CM

## 2022-01-07 DIAGNOSIS — Z936 Other artificial openings of urinary tract status: Secondary | ICD-10-CM

## 2022-01-07 DIAGNOSIS — N138 Other obstructive and reflux uropathy: Secondary | ICD-10-CM | POA: Diagnosis present

## 2022-01-07 DIAGNOSIS — R1900 Intra-abdominal and pelvic swelling, mass and lump, unspecified site: Secondary | ICD-10-CM

## 2022-01-07 DIAGNOSIS — I129 Hypertensive chronic kidney disease with stage 1 through stage 4 chronic kidney disease, or unspecified chronic kidney disease: Secondary | ICD-10-CM | POA: Diagnosis present

## 2022-01-07 DIAGNOSIS — Z8507 Personal history of malignant neoplasm of pancreas: Secondary | ICD-10-CM

## 2022-01-07 DIAGNOSIS — N1831 Chronic kidney disease, stage 3a: Secondary | ICD-10-CM | POA: Diagnosis present

## 2022-01-07 DIAGNOSIS — Z79899 Other long term (current) drug therapy: Secondary | ICD-10-CM

## 2022-01-07 DIAGNOSIS — N134 Hydroureter: Secondary | ICD-10-CM | POA: Diagnosis present

## 2022-01-07 DIAGNOSIS — Z9081 Acquired absence of spleen: Secondary | ICD-10-CM | POA: Diagnosis not present

## 2022-01-07 DIAGNOSIS — U071 COVID-19: Secondary | ICD-10-CM | POA: Diagnosis present

## 2022-01-07 DIAGNOSIS — Z90411 Acquired partial absence of pancreas: Secondary | ICD-10-CM

## 2022-01-07 DIAGNOSIS — C7911 Secondary malignant neoplasm of bladder: Secondary | ICD-10-CM | POA: Diagnosis present

## 2022-01-07 DIAGNOSIS — C7989 Secondary malignant neoplasm of other specified sites: Secondary | ICD-10-CM | POA: Diagnosis present

## 2022-01-07 DIAGNOSIS — Z9221 Personal history of antineoplastic chemotherapy: Secondary | ICD-10-CM

## 2022-01-07 DIAGNOSIS — Z0189 Encounter for other specified special examinations: Secondary | ICD-10-CM

## 2022-01-07 DIAGNOSIS — C252 Malignant neoplasm of tail of pancreas: Secondary | ICD-10-CM | POA: Diagnosis present

## 2022-01-07 DIAGNOSIS — I700 Atherosclerosis of aorta: Secondary | ICD-10-CM | POA: Diagnosis present

## 2022-01-07 DIAGNOSIS — R222 Localized swelling, mass and lump, trunk: Secondary | ICD-10-CM | POA: Insufficient documentation

## 2022-01-07 DIAGNOSIS — Z9049 Acquired absence of other specified parts of digestive tract: Secondary | ICD-10-CM | POA: Diagnosis not present

## 2022-01-07 DIAGNOSIS — K668 Other specified disorders of peritoneum: Secondary | ICD-10-CM

## 2022-01-07 LAB — CBC WITH DIFFERENTIAL/PLATELET
Abs Immature Granulocytes: 0.05 10*3/uL (ref 0.00–0.07)
Basophils Absolute: 0.1 10*3/uL (ref 0.0–0.1)
Basophils Relative: 1 %
Eosinophils Absolute: 0.2 10*3/uL (ref 0.0–0.5)
Eosinophils Relative: 1 %
HCT: 40.2 % (ref 39.0–52.0)
Hemoglobin: 13.1 g/dL (ref 13.0–17.0)
Immature Granulocytes: 0 %
Lymphocytes Relative: 9 %
Lymphs Abs: 1 10*3/uL (ref 0.7–4.0)
MCH: 29.9 pg (ref 26.0–34.0)
MCHC: 32.6 g/dL (ref 30.0–36.0)
MCV: 91.8 fL (ref 80.0–100.0)
Monocytes Absolute: 1.1 10*3/uL — ABNORMAL HIGH (ref 0.1–1.0)
Monocytes Relative: 9 %
Neutro Abs: 9.3 10*3/uL — ABNORMAL HIGH (ref 1.7–7.7)
Neutrophils Relative %: 80 %
Platelets: 618 10*3/uL — ABNORMAL HIGH (ref 150–400)
RBC: 4.38 MIL/uL (ref 4.22–5.81)
RDW: 16.5 % — ABNORMAL HIGH (ref 11.5–15.5)
WBC: 11.7 10*3/uL — ABNORMAL HIGH (ref 4.0–10.5)
nRBC: 0 % (ref 0.0–0.2)

## 2022-01-07 LAB — COMPREHENSIVE METABOLIC PANEL
ALT: 35 U/L (ref 0–44)
AST: 47 U/L — ABNORMAL HIGH (ref 15–41)
Albumin: 3.3 g/dL — ABNORMAL LOW (ref 3.5–5.0)
Alkaline Phosphatase: 70 U/L (ref 38–126)
Anion gap: 11 (ref 5–15)
BUN: 31 mg/dL — ABNORMAL HIGH (ref 8–23)
CO2: 22 mmol/L (ref 22–32)
Calcium: 9.2 mg/dL (ref 8.9–10.3)
Chloride: 103 mmol/L (ref 98–111)
Creatinine, Ser: 1.72 mg/dL — ABNORMAL HIGH (ref 0.61–1.24)
GFR, Estimated: 40 mL/min — ABNORMAL LOW (ref >60)
Glucose, Bld: 109 mg/dL — ABNORMAL HIGH (ref 70–99)
Potassium: 4.9 mmol/L (ref 3.5–5.1)
Sodium: 136 mmol/L (ref 135–145)
Total Bilirubin: 0.5 mg/dL (ref 0.3–1.2)
Total Protein: 7.9 g/dL (ref 6.5–8.1)

## 2022-01-07 LAB — LIPASE, BLOOD: Lipase: 55 U/L — ABNORMAL HIGH (ref 11–51)

## 2022-01-07 LAB — RESP PANEL BY RT-PCR (FLU A&B, COVID) ARPGX2
Influenza A by PCR: NEGATIVE
Influenza B by PCR: NEGATIVE
SARS Coronavirus 2 by RT PCR: POSITIVE — AB

## 2022-01-07 MED ORDER — LACTATED RINGERS IV SOLN: INTRAVENOUS | Status: DC

## 2022-01-07 MED ORDER — LACTATED RINGERS IV SOLN: INTRAVENOUS | Status: AC

## 2022-01-07 MED ORDER — HEPARIN SODIUM (PORCINE) 5000 UNIT/ML IJ SOLN
5000.0000 [IU] | Freq: Three times a day (TID) | INTRAMUSCULAR | Status: DC
  Filled 2022-01-07 (×2): qty 1

## 2022-01-07 MED ORDER — ACETAMINOPHEN 650 MG RE SUPP: 650.0000 mg | Freq: Four times a day (QID) | RECTAL | Status: DC | PRN

## 2022-01-07 MED ORDER — FENTANYL CITRATE PF 50 MCG/ML IJ SOSY: 12.5000 ug | PREFILLED_SYRINGE | INTRAMUSCULAR | Status: DC | PRN

## 2022-01-07 MED ORDER — ACETAMINOPHEN 325 MG PO TABS
650.0000 mg | ORAL_TABLET | Freq: Four times a day (QID) | ORAL | Status: DC | PRN
  Administered 2022-01-07 – 2022-01-10 (×4): 650 mg via ORAL
  Filled 2022-01-07 (×4): qty 2

## 2022-01-07 MED ORDER — DIATRIZOATE MEGLUMINE & SODIUM 66-10 % PO SOLN
90.0000 mL | Freq: Once | ORAL | Status: AC
  Administered 2022-01-07: 90 mL via NASOGASTRIC
  Filled 2022-01-07: qty 90

## 2022-01-07 NOTE — ED Notes (Addendum)
[facility] at bedside. Pt stable for transport. 

## 2022-01-07 NOTE — Progress Notes (Addendum)
Patient is refusing his NG tube; therefore, he is drinking his gastrografin. Notified Xray of when he has completed in order to do xray 8 hours post.   Patient is also refusing CBG checks every 8 hours. Patient also refusing heparin.   Notified Dr. Hal Hope of all refusals.

## 2022-01-07 NOTE — Consult Note (Signed)
Reason for Consult: Small bowel obstruction Referring Physician: Renetta Chalk MD   Kevin Villegas is an 81 y.o. male.  HPI: Complex 81 year old male with a history of pancreatic cancer involving his distal pancreas status post distal pancreatectomy in 2018 at [facility]  He has recurrent disease verified by CT scan and PET scanning.  He was admitted a month ago with a partial small bowel obstruction that resolved.  Of note he had a diverting colostomy in June 2022 with Dr. Johnathan Hausen for distal pelvic obstruction.  Unclear exactly what was found since the op note does not describe what the obstruction was from.  In any event, he is diverted since he had a distal colonic obstruction and a stent to that.  Of note he has evidence of metastatic disease to his bladder from recent cystoscopy in December 2022.  He is no longer taking chemotherapy.  He has intermittent bouts of abdominal pain distention and constipation.  He denies any nausea and vomiting though.  CT scan was performed and read as a partial small bowel obstruction.  I compared to scans from today and December 09, 2021 and they appeared quite similar.  He has no distention.  He has no rebound or guarding.  He has no nausea or vomiting and does have colostomy output but does get bouts of what he states constipation.  Of note he is COVID-positive but has been COVID-positive now for about a month looking at his chart.  He has no chest pain, shortness of breath.  PET scan shows metastatic disease involving the lungs and pleural space.  There is also uptake in the abdomen consistent with recurrent cancer in the cancer bed.  Overall, he is done quite well given the complex nature of his condition.  He has multiple issues with renal failure and obstruction of his kidneys requiring percutaneous nephrostomy tubes.  His creatinine is to 1.72 today and stable.  He has no fever or chills.  Past Medical History:  Diagnosis Date   Colostomy present [facility])    Large  bowel obstruction ([place])    Pancreatic cancer [facility])     Past Surgical History:  Procedure Laterality Date   ABDOMINAL SURGERY     BIOPSY  05/20/2021   Procedure: BIOPSY;  Surgeon: Otis Brace, MD;  Location: [facility];  Service: Gastroenterology;;   BOWEL RESECTION     BOWEL RESECTION     CHOLECYSTECTOMY N/A 12/16/2014   Procedure: LAPAROSCOPIC CHOLECYSTECTOMY WITH INTRAOPERATIVE CHOLANGIOGRAM;  Surgeon: Georganna Skeans, MD;  Location: [facility];  Service: General;  Laterality: N/A;   COLONIC STENT PLACEMENT N/A 05/26/2021   Procedure: COLONIC STENT PLACEMENT;  Surgeon: Clarene Essex, MD;  Location: [facility];  Service: Endoscopy;  Laterality: N/A;   CYSTOSCOPY W/ URETERAL STENT PLACEMENT Left 10/27/2021   Procedure: CYSTOSCOPY WITH RETROGRADE PYELOGRAM/URETERAL STENT PLACEMENT;  Surgeon: Raynelle Bring, MD;  Location: [facility];  Service: Urology;  Laterality: Left;   ESOPHAGOGASTRODUODENOSCOPY N/A 05/20/2021   Procedure: ESOPHAGOGASTRODUODENOSCOPY (EGD);  Surgeon: Otis Brace, MD;  Location: [facility];  Service: Gastroenterology;  Laterality: N/A;   EUS N/A 09/24/2015   Procedure: UPPER ENDOSCOPIC ULTRASOUND (EUS) LINEAR;  Surgeon: Milus Banister, MD;  Location: [facility];  Service: Endoscopy;  Laterality: N/A;   FLEXIBLE SIGMOIDOSCOPY N/A 05/20/2021   Procedure: FLEXIBLE SIGMOIDOSCOPY;  Surgeon: Otis Brace, MD;  Location: [facility];  Service: Gastroenterology;  Laterality: N/A;   FLEXIBLE SIGMOIDOSCOPY N/A 05/26/2021   Procedure: FLEXIBLE SIGMOIDOSCOPY;  Surgeon: Clarene Essex, MD;  Location: [facility];  Service: Endoscopy;  Laterality: N/A;   IR NEPHROSTOMY EXCHANGE LEFT  12/23/2021   IR NEPHROSTOMY PLACEMENT LEFT  11/11/2021   IR NEPHROSTOMY PLACEMENT RIGHT  11/11/2021   LAPAROSCOPIC DISTAL PANCREATECTOMY  10/2017   [facility]   LAPAROSCOPIC PARTIAL GASTRECTOMY  10/2017   [facility]   LAPAROSCOPIC SIGMOID COLECTOMY N/A 06/14/2021   Procedure: LAPAROSCOPY WITH  MOBILIZATION, PROXIMAL DIVERTING COLOSTOMY, CREATION OF DISTAL MUCOUS FISTULA;  Surgeon: Johnathan Hausen, MD;  Location: [facility];  Service: General;  Laterality: N/A;   LAPAROSCOPIC SPLENECTOMY  10/2017   [facility]   VASECTOMY      Family History  Problem Relation Age of Onset   Stroke Mother     Social History:  reports that he has never smoked. He has never used smokeless tobacco. He reports that he does not drink alcohol and does not use drugs.  Allergies:  Allergies  Allergen Reactions   Pollen Extract Other (See Comments)    Sinus congestion    Medications: I have reviewed the patient's current medications.  Results for orders placed or performed during the hospital encounter of 01/07/22 (from the past 48 hour(s))  CBC with Differential     Status: Abnormal   Collection Time: 01/07/22  4:07 PM  Result Value Ref Range   WBC 11.7 (H) 4.0 - 10.5 K/uL   RBC 4.38 4.22 - 5.81 MIL/uL   Hemoglobin 13.1 13.0 - 17.0 g/dL   HCT 40.2 39.0 - 52.0 %   MCV 91.8 80.0 - 100.0 fL   MCH 29.9 26.0 - 34.0 pg   MCHC 32.6 30.0 - 36.0 g/dL   RDW 16.5 (H) 11.5 - 15.5 %   Platelets 618 (H) 150 - 400 K/uL   nRBC 0.0 0.0 - 0.2 %   Neutrophils Relative % 80 %   Neutro Abs 9.3 (H) 1.7 - 7.7 K/uL   Lymphocytes Relative 9 %   Lymphs Abs 1.0 0.7 - 4.0 K/uL   Monocytes Relative 9 %   Monocytes Absolute 1.1 (H) 0.1 - 1.0 K/uL   Eosinophils Relative 1 %   Eosinophils Absolute 0.2 0.0 - 0.5 K/uL   Basophils Relative 1 %   Basophils Absolute 0.1 0.0 - 0.1 K/uL   Immature Granulocytes 0 %   Abs Immature Granulocytes 0.05 0.00 - 0.07 K/uL    Comment: Performed at [facility], [address] [place], [place] [address]  Comprehensive metabolic panel     Status: Abnormal   Collection Time: 01/07/22  4:07 PM  Result Value Ref Range   Sodium 136 135 - 145 mmol/L   Potassium 4.9 3.5 - 5.1 mmol/L   Chloride 103 98 - 111 mmol/L   CO2 22 22 - 32 mmol/L   Glucose, Bld 109 (H) 70 - 99 mg/dL     Comment: Glucose reference range applies only to samples taken after fasting for at least 8 hours.   BUN 31 (H) 8 - 23 mg/dL   Creatinine, Ser 1.72 (H) 0.61 - 1.24 mg/dL   Calcium 9.2 8.9 - 10.3 mg/dL   Total Protein 7.9 6.5 - 8.1 g/dL   Albumin 3.3 (L) 3.5 - 5.0 g/dL   AST 47 (H) 15 - 41 U/L   ALT 35 0 - 44 U/L   Alkaline Phosphatase 70 38 - 126 U/L   Total Bilirubin 0.5 0.3 - 1.2 mg/dL   GFR, Estimated 40 (L) >60 mL/min    Comment: (NOTE) Calculated using the CKD-EPI Creatinine Equation (2021)  Anion gap 11 5 - 15    Comment: Performed at [facility], [address] [place], [place] [address]  Lipase, blood     Status: Abnormal   Collection Time: 01/07/22  4:07 PM  Result Value Ref Range   Lipase 55 (H) 11 - 51 U/L    Comment: Performed at [facility] [address] [place], [place] [address]  Resp Panel by RT-PCR (Flu A&B, Covid) Nasopharyngeal Swab     Status: Abnormal   Collection Time: 01/07/22  5:55 PM   Specimen: Nasopharyngeal Swab; Nasopharyngeal(NP) swabs in vial transport medium  Result Value Ref Range   SARS Coronavirus 2 by RT PCR POSITIVE (A) NEGATIVE    Comment: (NOTE) SARS-CoV-2 target nucleic acids are DETECTED.  The SARS-CoV-2 RNA is generally detectable in upper respiratory specimens during the acute phase of infection. Positive results are indicative of the presence of the identified virus, but do not rule out bacterial infection or co-infection with other pathogens not detected by the test. Clinical correlation with patient history and other diagnostic information is necessary to determine patient infection status. The expected result is Negative.  Fact Sheet for Patients: EntrepreneurPulse.com.au  Fact Sheet for Healthcare Providers: IncredibleEmployment.be  This test is not yet approved or cleared by the [place] FDA and  has been authorized for detection and/or diagnosis of  SARS-CoV-2 by FDA under an Emergency Use Authorization (EUA).  This EUA will remain in effect (meaning this test can be used) for the duration of  the COVID-19 declaration under Section 564(b)(1) of the A ct, 21 U.S.C. section 360bbb-3(b)(1), unless the authorization is terminated or revoked sooner.     Influenza A by PCR NEGATIVE NEGATIVE   Influenza B by PCR NEGATIVE NEGATIVE    Comment: (NOTE) The Xpert Xpress SARS-CoV-2/FLU/RSV plus assay is intended as an aid in the diagnosis of influenza from Nasopharyngeal swab specimens and should not be used as a sole basis for treatment. Nasal washings and aspirates are unacceptable for Xpert Xpress SARS-CoV-2/FLU/RSV testing.  Fact Sheet for Patients: EntrepreneurPulse.com.au  Fact Sheet for Healthcare Providers: IncredibleEmployment.be  This test is not yet approved or cleared by the [place] FDA and has been authorized for detection and/or diagnosis of SARS-CoV-2 by FDA under an Emergency Use Authorization (EUA). This EUA will remain in effect (meaning this test can be used) for the duration of the COVID-19 declaration under Section 564(b)(1) of the Act, 21 U.S.C. section 360bbb-3(b)(1), unless the authorization is terminated or revoked.  Performed at [facility], [address] [place], [place] [address]     CT ABDOMEN PELVIS WO CONTRAST  Result Date: 01/07/2022 CLINICAL DATA:  Abdominal pain.  History of colostomy. EXAM: CT ABDOMEN AND PELVIS WITHOUT CONTRAST TECHNIQUE: Multidetector CT imaging of the abdomen and pelvis was performed following the standard protocol without IV contrast. RADIATION DOSE REDUCTION: This exam was performed according to the departmental dose-optimization program which includes automated exposure control, adjustment of the mA and/or kV according to patient size and/or use of iterative reconstruction technique. COMPARISON:  12/09/2021 FINDINGS: Lower  chest: Pulmonary nodules again noted in the lung bases. 14 mm nodule right lower lobe on image 5/series 4 is stable in the interval. Similar appearance small left pleural effusion. Hepatobiliary: Scattered hypoattenuating lesions in the liver parenchyma are similar to prior and incompletely characterized on noncontrast imaging. Gallbladder surgically absent. No intrahepatic or extrahepatic biliary dilation. Pancreas: Status post distal pancreatectomy. Spleen: Status  post splenectomy. Adrenals/Urinary Tract: No adrenal nodule or mass. Right renal atrophy again noted with right-sided hydroureteronephrosis. Double-J internal ureteral stent noted left kidney with associated percutaneous nephrostomy tube in the left kidney. Bladder is decompressed. Stomach/Bowel: Stomach is decompressed. Duodenum is normally positioned as is the ligament of Treitz. Distal duodenal stent is similar to prior. Small bowel loops are more dilated on today's study with fluid-filled small bowel dilated up to 4.8 cm diameter in the mid abdomen deep to the umbilicus. Abrupt transition zone is not clearly demonstrated but appears to be in the left pelvis, similar in location to the prior study (axial 64/2). Distal small bowel loops of the pelvis are decompressed. Terminal ileum not well seen. The appendix is not well visualized, but there is no edema or inflammation in the region of the cecum. Left abdominal sigmoid end colostomy noted with Hartmann's pouch anatomy. Vascular/Lymphatic: There is mild atherosclerotic calcification of the abdominal aorta without aneurysm. There is no gastrohepatic or hepatoduodenal ligament lymphadenopathy. No retroperitoneal or mesenteric lymphadenopathy. No pelvic sidewall lymphadenopathy. Reproductive: The prostate gland and seminal vesicles are unremarkable. Other: No intraperitoneal free fluid. Musculoskeletal: No worrisome lytic or sclerotic osseous abnormality. Stable appearance of soft tissue nodules anterior  right abdominal wall. IMPRESSION: 1. Similar fluid-filled dilatation of proximal and mid small bowel with transition zone identified in the left pelvis. Imaging features suspicious for small bowel obstruction secondary to adhesion. 2. Left abdominal and sigmoid colostomy with Hartmann's pouch anatomy. 3. Left percutaneous nephrostomy tube with double-J left internal ureteral stent in place. 4. Right renal atrophy with right-sided hydroureteronephrosis, stable. 5. Stable appearance of small left pleural effusion. 6. Stable appearance of bilateral pulmonary nodules compatible with metastatic disease. 7. Status post distal pancreatectomy and splenectomy. 8. Aortic Atherosclerosis (ICD10-I70.0). Electronically Signed   By: Misty Bentsen M.D.   On: 01/07/2022 16:29   DG CHEST PORT 1 VIEW  Result Date: 01/07/2022 CLINICAL DATA:  COVID. EXAM: PORTABLE CHEST 1 VIEW COMPARISON:  Chest x-ray 06/11/2021. CT abdomen and pelvis 01/07/2022. CT 11/26/2021. FINDINGS: Right chest port catheter tip projects over the mid SVC. There is a moderate-sized left pleural effusion which may be loculated near the apex. Loculation may be new from prior study. Bilateral pulmonary nodules are again seen which appear similar to prior CT 11/26/2021. No new focal lung infiltrate. No pneumothorax. Cardiomediastinal silhouette appears stable. Heart is enlarged aorta is ectatic. No acute fractures are seen. IMPRESSION: 1. Moderate left pleural effusion is likely partially loculated in the upper hemithorax. Loculation may be new from prior. 2. Grossly unchanged bilateral pulmonary nodules. 3. No new focal lung infiltrate. Electronically Signed   By: Ronney Asters M.D.   On: 01/07/2022 21:56    Review of Systems  Constitutional:  Positive for fatigue. Negative for fever.  HENT: Negative.    Respiratory: Negative.    Gastrointestinal:  Positive for abdominal pain and constipation. Negative for nausea and vomiting.  All other systems reviewed  and are negative. Blood pressure (!) 156/86, pulse 75, temperature 97.8 F (36.6 C), temperature source Oral, resp. rate 20, height 5\' 11"  (1.803 m), weight 63.5 kg, SpO2 97 %. Physical Exam Constitutional:      Appearance: Normal appearance.  HENT:     Head: Normocephalic and atraumatic.     Nose: Nose normal.  Eyes:     General: No scleral icterus.    Pupils: Pupils are equal, round, and reactive to light.  Cardiovascular:     Rate and Rhythm: Normal rate.  Pulmonary:     Effort: Pulmonary effort is normal.     Breath sounds: No stridor. No wheezing.  Abdominal:     General: Abdomen is flat. There is no distension.     Palpations: There is mass.     Tenderness: There is no abdominal tenderness. There is no guarding.       Comments: Ostomy noted  Musculoskeletal:     Cervical back: Normal range of motion.  Neurological:     Mental Status: He is alert.    Assessment/Plan:  Patient Active Problem List   Diagnosis Date Noted   SBO (small bowel obstruction) ([place]) 01/07/2022   COVID-19 virus infection 01/07/2022   Colostomy in place [facility]) 11/10/2021   Stage 3a chronic kidney disease (CKD) ([place]) 11/10/2021   Pyelonephritis    Essential hypertension 10/30/2021   Hydronephrosis of left kidney 10/28/2021   Ureteral stricture, left 10/27/2021   Protein-calorie malnutrition, severe 06/12/2021   Hydroureter on right 06/10/2021   Aortic atherosclerosis ([place]) 06/10/2021   Right ureteral stone 06/02/2021   Colon stricture ([place])    Pancreatic mass 06/27/2019   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  with metastatic disease 06/27/2019   Rhinitis medicamentosa 02/26/2019      Reviewed CT scans examined patient.  He is not having any nausea and vomiting and some ostomy output.  He probably has a low-grade partial obstruction for metastatic disease and concern for carcinomatosis.  He is managed medically and operative intervention for this problem after reviewing the  previous operative note may be very difficult if not impossible.  He is not vomiting recommend small bowel protocol which we can start tonight and allowing clear liquids as he has had no vomiting.  I explained to him his high operative risk.  I explained surgery would reduce his quality of life.  He has a 1 in 4 chance of significant complication and a 10 to 12% of perioperative mortality  Recommend small bowel protocol.  He can drink the contrast.  This would enable [place] to [facility] see how his GI tract functions.  Given the fact that he will never take chemotherapy, I am not sure surgery makes a lot of sense and limited unless he is completely obstructed since I do not think is quite life will improve with surgery given his condition.  Further Rex at a later time once more information is obtained.  Allow clear liquids   Total time 60 minutes for face-to-face time and non-face-to-face time, reviewing charts, reading charts, reviewing studies, comparing studies, and long-term discussion about expectations, surgery if necessary and care.  Vinia Jemmott A Maddy Graham 01/07/2022, 10:20 PM

## 2022-01-07 NOTE — ED Provider Notes (Signed)
[place] EMERGENCY DEPARTMENT Provider Note   CSN: [id number] Arrival date & time: 01/07/22  1525     History  Chief Complaint  Patient presents with   Constipation    Kevin Villegas is a 81 y.o. male.  Presented to the emergency room with concern for constipation.  Patient reports that over the last 3 days he had almost no ostomy output.  Since coming to the ER he has noted a very small amount of ostomy output.  No nausea or vomiting.  No fevers or chills.    HPI     Home Medications Prior to Admission medications   Medication Sig Start Date End Date Taking? Authorizing Provider  acetaminophen (TYLENOL) 325 MG tablet Take 650 mg by mouth every 6 (six) hours as needed for mild pain, fever or headache.    [provider]  mirtazapine (REMERON) 15 MG tablet Take 1 tablet (15 mg total) by mouth at bedtime. 12/31/21   Dettinger, [name], MD      Allergies    Pollen extract    Review of Systems   Review of Systems  Constitutional:  Positive for fatigue. Negative for chills and fever.  HENT:  Negative for ear pain and sore throat.   Eyes:  Negative for pain and visual disturbance.  Respiratory:  Negative for cough and shortness of breath.   Cardiovascular:  Negative for chest pain and palpitations.  Gastrointestinal:  Positive for abdominal pain and nausea. Negative for vomiting.  Genitourinary:  Negative for dysuria and hematuria.  Musculoskeletal:  Negative for arthralgias and back pain.  Skin:  Negative for color change and rash.  Neurological:  Negative for seizures and syncope.  All other systems reviewed and are negative.  Physical Exam Updated Vital Signs BP 126/90    Pulse 81    Temp 97.7 F (36.5 C) (Oral)    Resp 18    Ht 5\' 11"  (1.803 m)    Wt 63.5 kg    SpO2 98%    BMI 19.53 kg/m  Physical Exam Vitals and nursing note reviewed.  Constitutional:      General: He is not in acute distress.    Appearance: He is well-developed.  HENT:      Head: Normocephalic and atraumatic.  Eyes:     Conjunctiva/sclera: Conjunctivae normal.  Cardiovascular:     Rate and Rhythm: Normal rate and regular rhythm.     Heart sounds: No murmur heard. Pulmonary:     Effort: Pulmonary effort is normal. No respiratory distress.     Breath sounds: Normal breath sounds.  Abdominal:     Palpations: Abdomen is soft.     Tenderness: There is no abdominal tenderness.     Comments: There is some generalized tenderness to palpation but no rebound or guarding  Musculoskeletal:        General: No swelling.     Cervical back: Neck supple.  Skin:    General: Skin is warm and dry.     Capillary Refill: Capillary refill takes less than 2 seconds.  Neurological:     Mental Status: He is alert.  Psychiatric:        Mood and Affect: Mood normal.    ED Results / Procedures / Treatments   Labs (all labs ordered are listed, but only abnormal results are displayed) Labs Reviewed  CBC WITH DIFFERENTIAL/PLATELET - Abnormal; Notable for the following components:      Result Value   WBC 11.7 (*)  RDW 16.5 (*)    Platelets 618 (*)    Neutro Abs 9.3 (*)    Monocytes Absolute 1.1 (*)    All other components within normal limits  COMPREHENSIVE METABOLIC PANEL - Abnormal; Notable for the following components:   Glucose, Bld 109 (*)    BUN 31 (*)    Creatinine, Ser 1.72 (*)    Albumin 3.3 (*)    AST 47 (*)    GFR, Estimated 40 (*)    All other components within normal limits  LIPASE, BLOOD - Abnormal; Notable for the following components:   Lipase 55 (*)    All other components within normal limits  RESP PANEL BY RT-PCR (FLU A&B, COVID) [id number]    EKG None  Radiology CT ABDOMEN PELVIS WO CONTRAST  Result Date: 01/07/2022 CLINICAL DATA:  Abdominal pain.  History of colostomy. EXAM: CT ABDOMEN AND PELVIS WITHOUT CONTRAST TECHNIQUE: Multidetector CT imaging of the abdomen and pelvis was performed following the standard protocol without IV contrast.  RADIATION DOSE REDUCTION: This exam was performed according to the departmental dose-optimization program which includes automated exposure control, adjustment of the mA and/or kV according to patient size and/or use of iterative reconstruction technique. COMPARISON:  12/09/2021 FINDINGS: Lower chest: Pulmonary nodules again noted in the lung bases. 14 mm nodule right lower lobe on image 5/series 4 is stable in the interval. Similar appearance small left pleural effusion. Hepatobiliary: Scattered hypoattenuating lesions in the liver parenchyma are similar to prior and incompletely characterized on noncontrast imaging. Gallbladder surgically absent. No intrahepatic or extrahepatic biliary dilation. Pancreas: Status post distal pancreatectomy. Spleen: Status post splenectomy. Adrenals/Urinary Tract: No adrenal nodule or mass. Right renal atrophy again noted with right-sided hydroureteronephrosis. Double-J internal ureteral stent noted left kidney with associated percutaneous nephrostomy tube in the left kidney. Bladder is decompressed. Stomach/Bowel: Stomach is decompressed. Duodenum is normally positioned as is the ligament of Treitz. Distal duodenal stent is similar to prior. Small bowel loops are more dilated on today's study with fluid-filled small bowel dilated up to 4.8 cm diameter in the mid abdomen deep to the umbilicus. Abrupt transition zone is not clearly demonstrated but appears to be in the left pelvis, similar in location to the prior study (axial 64/2). Distal small bowel loops of the pelvis are decompressed. Terminal ileum not well seen. The appendix is not well visualized, but there is no edema or inflammation in the region of the cecum. Left abdominal sigmoid end colostomy noted with Hartmann's pouch anatomy. Vascular/Lymphatic: There is mild atherosclerotic calcification of the abdominal aorta without aneurysm. There is no gastrohepatic or hepatoduodenal ligament lymphadenopathy. No retroperitoneal  or mesenteric lymphadenopathy. No pelvic sidewall lymphadenopathy. Reproductive: The prostate gland and seminal vesicles are unremarkable. Other: No intraperitoneal free fluid. Musculoskeletal: No worrisome lytic or sclerotic osseous abnormality. Stable appearance of soft tissue nodules anterior right abdominal wall. IMPRESSION: 1. Similar fluid-filled dilatation of proximal and mid small bowel with transition zone identified in the left pelvis. Imaging features suspicious for small bowel obstruction secondary to adhesion. 2. Left abdominal and sigmoid colostomy with Hartmann's pouch anatomy. 3. Left percutaneous nephrostomy tube with double-J left internal ureteral stent in place. 4. Right renal atrophy with right-sided hydroureteronephrosis, stable. 5. Stable appearance of small left pleural effusion. 6. Stable appearance of bilateral pulmonary nodules compatible with metastatic disease. 7. Status post distal pancreatectomy and splenectomy. 8. Aortic Atherosclerosis (ICD10-I70.0). Electronically Signed   By: Misty Densmore M.D.   On: 01/07/2022 16:29    Procedures Procedures  Medications Ordered in ED Medications  lactated ringers infusion (has no administration in time range)    ED Course/ Medical Decision Making/ A&P                           Medical Decision Making Amount and/or Complexity of Data Reviewed Labs: ordered. Radiology: ordered.  Risk Prescription drug management.   81 year old gentleman presenting to ER with concern for decreased ostomy output.  He appears well in no distress had some generalized tenderness on exam but no rebound or guarding.  Concern for SBO versus diverticulitis versus other acute abdominal pathology.  Additional history was obtained from chart review and patient's wife at bedside.  Reviewed discharge summary from December admission for SBO.  Reviewed general surgery notes, patient felt not to be a surgical candidate with his pancreatic cancer.   Successfully managed nonoperatively.  Basic labs today demonstrating creatinine at baseline.  No other electrolyte derangements appreciated.  CT scan obtained, personally reviewed and agree with radiologist.  Concern for recurrent small bowel obstruction.  Transition point in left pelvis.  Discussed case with Dr. Georgette Dover who recommends medicine admission, NG tube, they will see as consult once patient has been admitted.        Final Clinical Impression(s) / ED Diagnoses Final diagnoses:  Small bowel obstruction ([place])  History of pancreatic cancer    Rx / DC Orders ED Discharge Orders     None         Lucrezia Starch, MD 01/07/22 1737

## 2022-01-07 NOTE — ED Triage Notes (Signed)
Pt arrives pov with c/o inability to pass stool. Pt endorses colostomy bag. Stool noted in bag. Pt endorses small stool upon arrival

## 2022-01-07 NOTE — ED Notes (Signed)
Patient transported to CT 

## 2022-01-07 NOTE — ED Notes (Signed)
[facility] called for transport. 

## 2022-01-07 NOTE — H&P (Signed)
History and Physical    Kevin Villegas VXB:[id number] DOB: [birth year] DOA: 01/07/2022  PCP: Dettinger, [name], MD  Patient coming from: Home.  Chief Complaint: Decreased colostomy output.  HPI: Kevin Villegas is a 81 y.o. male with history of metastatic pancreatic cancer with history of obstructive nephropathy status post left-sided nephrostomy tube placement with history of colectomy and colostomy admitted last month for bowel obstruction presents to the ER because of decreased colostomy output for the last 3 days but denies any vomiting or abdominal discomfort.  ED Course: In the ER patient had CT abdomen pelvis which shows features concerning for small bowel obstruction and general surgery was consulted patient admitted for further work-up.  Labs show mild leukocytosis creatinine at baseline at 1.7.  COVID test was positive but patient not hypoxic.  Chest x-ray does show left pleural effusion which may be loculated.  Review of Systems: As per HPI, rest all negative.   Past Medical History:  Diagnosis Date   Colostomy present [facility])    Large bowel obstruction ([place])    Pancreatic cancer [facility])     Past Surgical History:  Procedure Laterality Date   ABDOMINAL SURGERY     BIOPSY  05/20/2021   Procedure: BIOPSY;  Surgeon: Otis Brace, MD;  Location: [facility];  Service: Gastroenterology;;   BOWEL RESECTION     BOWEL RESECTION     CHOLECYSTECTOMY N/A 12/16/2014   Procedure: LAPAROSCOPIC CHOLECYSTECTOMY WITH INTRAOPERATIVE CHOLANGIOGRAM;  Surgeon: Georganna Skeans, MD;  Location: [place];  Service: General;  Laterality: N/A;   COLONIC STENT PLACEMENT N/A 05/26/2021   Procedure: COLONIC STENT PLACEMENT;  Surgeon: Clarene Essex, MD;  Location: [facility];  Service: Endoscopy;  Laterality: N/A;   CYSTOSCOPY W/ URETERAL STENT PLACEMENT Left 10/27/2021   Procedure: CYSTOSCOPY WITH RETROGRADE PYELOGRAM/URETERAL STENT PLACEMENT;  Surgeon: Raynelle Bring, MD;  Location: [facility];  Service:  Urology;  Laterality: Left;   ESOPHAGOGASTRODUODENOSCOPY N/A 05/20/2021   Procedure: ESOPHAGOGASTRODUODENOSCOPY (EGD);  Surgeon: Otis Brace, MD;  Location: [facility];  Service: Gastroenterology;  Laterality: N/A;   EUS N/A 09/24/2015   Procedure: UPPER ENDOSCOPIC ULTRASOUND (EUS) LINEAR;  Surgeon: Milus Banister, MD;  Location: [facility];  Service: Endoscopy;  Laterality: N/A;   FLEXIBLE SIGMOIDOSCOPY N/A 05/20/2021   Procedure: FLEXIBLE SIGMOIDOSCOPY;  Surgeon: Otis Brace, MD;  Location: [facility];  Service: Gastroenterology;  Laterality: N/A;   FLEXIBLE SIGMOIDOSCOPY N/A 05/26/2021   Procedure: FLEXIBLE SIGMOIDOSCOPY;  Surgeon: Clarene Essex, MD;  Location: [facility];  Service: Endoscopy;  Laterality: N/A;   IR NEPHROSTOMY EXCHANGE LEFT  12/23/2021   IR NEPHROSTOMY PLACEMENT LEFT  11/11/2021   IR NEPHROSTOMY PLACEMENT RIGHT  11/11/2021   LAPAROSCOPIC DISTAL PANCREATECTOMY  10/2017   [facility]   LAPAROSCOPIC PARTIAL GASTRECTOMY  10/2017   [facility]   LAPAROSCOPIC SIGMOID COLECTOMY N/A 06/14/2021   Procedure: LAPAROSCOPY WITH MOBILIZATION, PROXIMAL DIVERTING COLOSTOMY, CREATION OF DISTAL MUCOUS FISTULA;  Surgeon: Johnathan Hausen, MD;  Location: [facility];  Service: General;  Laterality: N/A;   LAPAROSCOPIC SPLENECTOMY  10/2017   [facility]   VASECTOMY       reports that he has never smoked. He has never used smokeless tobacco. He reports that he does not drink alcohol and does not use drugs.  Allergies  Allergen Reactions   Pollen Extract Other (See Comments)    Sinus congestion    Family History  Problem Relation Age of Onset   Stroke Mother     Prior to Admission medications   Medication Sig Start  Date End Date Taking? Authorizing Provider  acetaminophen (TYLENOL) 325 MG tablet Take 650 mg by mouth every 6 (six) hours as needed for mild pain, fever or headache.    [provider]  mirtazapine (REMERON) 15 MG tablet Take 1 tablet (15 mg total) by mouth at bedtime.  12/31/21   Dettinger, [name], MD    Physical Exam: Constitutional: Moderately built and nourished. Vitals:   01/07/22 1700 01/07/22 1915 01/07/22 2000 01/07/22 2030  BP: 126/90 (!) 129/92 (!) 140/102 (!) 134/101  Pulse: 81 80 76 77  Resp: 18     Temp:      TempSrc:      SpO2: 98% 93% 99% 97%  Weight:      Height:       Eyes: Anicteric no pallor. ENMT: No discharge from the ears eyes nose and mouth. Neck: No mass felt.  No neck rigidity. Respiratory: No rhonchi or crepitations. Cardiovascular: S1-S2 heard. Abdomen: Soft mildly distended nontender colostomy seen. Musculoskeletal: No edema. Skin: No rash. Neurologic: Alert awake oriented to time place and person.  Moves all extremities. Psychiatric: Appears normal.  Normal affect.   Labs on Admission: I have personally reviewed following labs and imaging studies  CBC: Recent Labs  Lab 01/07/22 1607  WBC 11.7*  NEUTROABS 9.3*  HGB 13.1  HCT 40.2  MCV 91.8  PLT 765*   Basic Metabolic Panel: Recent Labs  Lab 01/07/22 1607  NA 136  K 4.9  CL 103  CO2 22  GLUCOSE 109*  BUN 31*  CREATININE 1.72*  CALCIUM 9.2   GFR: Estimated Creatinine Clearance: 30.8 mL/min (A) (by C-G formula based on SCr of 1.72 mg/dL (H)). Liver Function Tests: Recent Labs  Lab 01/07/22 1607  AST 47*  ALT 35  ALKPHOS 70  BILITOT 0.5  PROT 7.9  ALBUMIN 3.3*   Recent Labs  Lab 01/07/22 1607  LIPASE 55*   No results for input(s): AMMONIA in the last 168 hours. Coagulation Profile: No results for input(s): INR, PROTIME in the last 168 hours. Cardiac Enzymes: No results for input(s): CKTOTAL, CKMB, CKMBINDEX, TROPONINI in the last 168 hours. BNP (last 3 results) No results for input(s): PROBNP in the last 8760 hours. HbA1C: No results for input(s): HGBA1C in the last 72 hours. CBG: No results for input(s): GLUCAP in the last 168 hours. Lipid Profile: No results for input(s): CHOL, HDL, LDLCALC, TRIG, CHOLHDL, LDLDIRECT in the  last 72 hours. Thyroid Function Tests: No results for input(s): TSH, T4TOTAL, FREET4, T3FREE, THYROIDAB in the last 72 hours. Anemia Panel: No results for input(s): VITAMINB12, FOLATE, FERRITIN, TIBC, IRON, RETICCTPCT in the last 72 hours. Urine analysis:    Component Value Date/Time   COLORURINE YELLOW 12/10/2021 0032   APPEARANCEUR CLOUDY (A) 12/10/2021 0032   LABSPEC 1.025 12/10/2021 0032   PHURINE 5.5 12/10/2021 0032   GLUCOSEU NEGATIVE 12/10/2021 0032   HGBUR LARGE (A) 12/10/2021 0032   BILIRUBINUR NEGATIVE 12/10/2021 0032   KETONESUR NEGATIVE 12/10/2021 0032   PROTEINUR >300 (A) 12/10/2021 0032   UROBILINOGEN 0.2 08/23/2015 1555   NITRITE NEGATIVE 12/10/2021 0032   LEUKOCYTESUR SMALL (A) 12/10/2021 0032   Sepsis Labs: @LABRCNTIP (procalcitonin:4,lacticidven:4) ) Recent Results (from the past 240 hour(s))  Resp Panel by RT-PCR (Flu A&B, Covid) Nasopharyngeal Swab     Status: Abnormal   Collection Time: 01/07/22  5:55 PM   Specimen: Nasopharyngeal Swab; Nasopharyngeal(NP) swabs in vial transport medium  Result Value Ref Range Status   SARS Coronavirus 2 by RT  PCR POSITIVE (A) NEGATIVE Final    Comment: (NOTE) SARS-CoV-2 target nucleic acids are DETECTED.  The SARS-CoV-2 RNA is generally detectable in upper respiratory specimens during the acute phase of infection. Positive results are indicative of the presence of the identified virus, but do not rule out bacterial infection or co-infection with other pathogens not detected by the test. Clinical correlation with patient history and other diagnostic information is necessary to determine patient infection status. The expected result is Negative.  Fact Sheet for Patients: EntrepreneurPulse.com.au  Fact Sheet for Healthcare Providers: IncredibleEmployment.be  This test is not yet approved or cleared by the [place] FDA and  has been authorized for detection and/or diagnosis of  SARS-CoV-2 by FDA under an Emergency Use Authorization (EUA).  This EUA will remain in effect (meaning this test can be used) for the duration of  the COVID-19 declaration under Section 564(b)(1) of the A ct, 21 U.S.C. section 360bbb-3(b)(1), unless the authorization is terminated or revoked sooner.     Influenza A by PCR NEGATIVE NEGATIVE Final   Influenza B by PCR NEGATIVE NEGATIVE Final    Comment: (NOTE) The Xpert Xpress SARS-CoV-2/FLU/RSV plus assay is intended as an aid in the diagnosis of influenza from Nasopharyngeal swab specimens and should not be used as a sole basis for treatment. Nasal washings and aspirates are unacceptable for Xpert Xpress SARS-CoV-2/FLU/RSV testing.  Fact Sheet for Patients: EntrepreneurPulse.com.au  Fact Sheet for Healthcare Providers: IncredibleEmployment.be  This test is not yet approved or cleared by the [place] FDA and has been authorized for detection and/or diagnosis of SARS-CoV-2 by FDA under an Emergency Use Authorization (EUA). This EUA will remain in effect (meaning this test can be used) for the duration of the COVID-19 declaration under Section 564(b)(1) of the Act, 21 U.S.C. section 360bbb-3(b)(1), unless the authorization is terminated or revoked.  Performed at [facility], [address] [place], [place] [address]      Radiological Exams on Admission: CT ABDOMEN PELVIS WO CONTRAST  Result Date: 01/07/2022 CLINICAL DATA:  Abdominal pain.  History of colostomy. EXAM: CT ABDOMEN AND PELVIS WITHOUT CONTRAST TECHNIQUE: Multidetector CT imaging of the abdomen and pelvis was performed following the standard protocol without IV contrast. RADIATION DOSE REDUCTION: This exam was performed according to the departmental dose-optimization program which includes automated exposure control, adjustment of the mA and/or kV according to patient size and/or use of iterative reconstruction  technique. COMPARISON:  12/09/2021 FINDINGS: Lower chest: Pulmonary nodules again noted in the lung bases. 14 mm nodule right lower lobe on image 5/series 4 is stable in the interval. Similar appearance small left pleural effusion. Hepatobiliary: Scattered hypoattenuating lesions in the liver parenchyma are similar to prior and incompletely characterized on noncontrast imaging. Gallbladder surgically absent. No intrahepatic or extrahepatic biliary dilation. Pancreas: Status post distal pancreatectomy. Spleen: Status post splenectomy. Adrenals/Urinary Tract: No adrenal nodule or mass. Right renal atrophy again noted with right-sided hydroureteronephrosis. Double-J internal ureteral stent noted left kidney with associated percutaneous nephrostomy tube in the left kidney. Bladder is decompressed. Stomach/Bowel: Stomach is decompressed. Duodenum is normally positioned as is the ligament of Treitz. Distal duodenal stent is similar to prior. Small bowel loops are more dilated on today's study with fluid-filled small bowel dilated up to 4.8 cm diameter in the mid abdomen deep to the umbilicus. Abrupt transition zone is not clearly demonstrated but appears to be in the left pelvis, similar in location to the prior study (axial 64/2). Distal small bowel loops  of the pelvis are decompressed. Terminal ileum not well seen. The appendix is not well visualized, but there is no edema or inflammation in the region of the cecum. Left abdominal sigmoid end colostomy noted with Hartmann's pouch anatomy. Vascular/Lymphatic: There is mild atherosclerotic calcification of the abdominal aorta without aneurysm. There is no gastrohepatic or hepatoduodenal ligament lymphadenopathy. No retroperitoneal or mesenteric lymphadenopathy. No pelvic sidewall lymphadenopathy. Reproductive: The prostate gland and seminal vesicles are unremarkable. Other: No intraperitoneal free fluid. Musculoskeletal: No worrisome lytic or sclerotic osseous  abnormality. Stable appearance of soft tissue nodules anterior right abdominal wall. IMPRESSION: 1. Similar fluid-filled dilatation of proximal and mid small bowel with transition zone identified in the left pelvis. Imaging features suspicious for small bowel obstruction secondary to adhesion. 2. Left abdominal and sigmoid colostomy with Hartmann's pouch anatomy. 3. Left percutaneous nephrostomy tube with double-J left internal ureteral stent in place. 4. Right renal atrophy with right-sided hydroureteronephrosis, stable. 5. Stable appearance of small left pleural effusion. 6. Stable appearance of bilateral pulmonary nodules compatible with metastatic disease. 7. Status post distal pancreatectomy and splenectomy. 8. Aortic Atherosclerosis (ICD10-I70.0). Electronically Signed   By: Misty Gramm M.D.   On: 01/07/2022 16:29     Assessment/Plan Principal Problem:   SBO (small bowel obstruction) (HCC) Active Problems:   COVID-19 virus infection    Small bowel obstruction with prior history of colectomy and colostomy we will keep patient n.p.o. and consult general surgery.  Patient did have a small bowel movement after admission. COVID-19 infection -patient presently not symptomatic and not hypoxic.  Patient does not want any medication at this time for this.  Patient states he was exposed to COVID infection about 2 weeks ago.  Closely monitor respiratory status improvement markers. Chronic left-sided pleural effusion which may be appearing to be loculated at this time.  We will get two-view x-ray and further eval plans on this. Chronic kidney disease with obstructive nephropathy with left-sided nephrostomy tube.  Creatinine is at baseline. History of hypertension as per the chart not take any medicines for blood pressure at this time.  Closely follow blood pressure trends. Metastatic pancreatic cancer being followed by Dr. Burney Gauze oncologist.  Since patient has bowel obstruction and also COVID  infection will need close monitoring for any further worsening inpatient status.   DVT prophylaxis: Heparin. Code Status: Full code. Family Communication: Discussed with patient. Disposition Plan: Home. Consults called: General surgery. Admission status: Inpatient.   Rise Patience MD [facility] Pager [phone].  If 7PM-7AM, please contact night-coverage [email or web address] Password [email or web address]  01/07/2022, 9:30 PM

## 2022-01-07 NOTE — ED Notes (Addendum)
Pts belongings (shoes, 2 shirts, jacket, jeans, belt) placed in bag and sat at bedside.   Pt currently refusing NG tube placement. No n/v present at this time.

## 2022-01-08 ENCOUNTER — Inpatient Hospital Stay (HOSPITAL_COMMUNITY): Payer: Medicare Other

## 2022-01-08 DIAGNOSIS — Z933 Colostomy status: Secondary | ICD-10-CM | POA: Diagnosis not present

## 2022-01-08 DIAGNOSIS — N1330 Unspecified hydronephrosis: Secondary | ICD-10-CM

## 2022-01-08 DIAGNOSIS — C252 Malignant neoplasm of tail of pancreas: Secondary | ICD-10-CM

## 2022-01-08 DIAGNOSIS — U071 COVID-19: Secondary | ICD-10-CM | POA: Diagnosis not present

## 2022-01-08 DIAGNOSIS — K56609 Unspecified intestinal obstruction, unspecified as to partial versus complete obstruction: Secondary | ICD-10-CM | POA: Diagnosis not present

## 2022-01-08 DIAGNOSIS — N1831 Chronic kidney disease, stage 3a: Secondary | ICD-10-CM

## 2022-01-08 DIAGNOSIS — N134 Hydroureter: Secondary | ICD-10-CM

## 2022-01-08 LAB — CBC WITH DIFFERENTIAL/PLATELET
Abs Immature Granulocytes: 0.03 10*3/uL (ref 0.00–0.07)
Basophils Absolute: 0.1 10*3/uL (ref 0.0–0.1)
Basophils Relative: 1 %
Eosinophils Absolute: 0.4 10*3/uL (ref 0.0–0.5)
Eosinophils Relative: 4 %
HCT: 35.6 % — ABNORMAL LOW (ref 39.0–52.0)
Hemoglobin: 11.2 g/dL — ABNORMAL LOW (ref 13.0–17.0)
Immature Granulocytes: 0 %
Lymphocytes Relative: 15 %
Lymphs Abs: 1.5 10*3/uL (ref 0.7–4.0)
MCH: 29.9 pg (ref 26.0–34.0)
MCHC: 31.5 g/dL (ref 30.0–36.0)
MCV: 94.9 fL (ref 80.0–100.0)
Monocytes Absolute: 1.2 10*3/uL — ABNORMAL HIGH (ref 0.1–1.0)
Monocytes Relative: 11 %
Neutro Abs: 7 10*3/uL (ref 1.7–7.7)
Neutrophils Relative %: 69 %
Platelets: 642 10*3/uL — ABNORMAL HIGH (ref 150–400)
RBC: 3.75 MIL/uL — ABNORMAL LOW (ref 4.22–5.81)
RDW: 16.7 % — ABNORMAL HIGH (ref 11.5–15.5)
WBC: 10.1 10*3/uL (ref 4.0–10.5)
nRBC: 0 % (ref 0.0–0.2)

## 2022-01-08 LAB — D-DIMER, QUANTITATIVE: D-Dimer, Quant: 3.38 ug/mL-FEU — ABNORMAL HIGH (ref 0.00–0.50)

## 2022-01-08 LAB — COMPREHENSIVE METABOLIC PANEL
ALT: 29 U/L (ref 0–44)
AST: 33 U/L (ref 15–41)
Albumin: 3 g/dL — ABNORMAL LOW (ref 3.5–5.0)
Alkaline Phosphatase: 62 U/L (ref 38–126)
Anion gap: 8 (ref 5–15)
BUN: 31 mg/dL — ABNORMAL HIGH (ref 8–23)
CO2: 25 mmol/L (ref 22–32)
Calcium: 8.7 mg/dL — ABNORMAL LOW (ref 8.9–10.3)
Chloride: 107 mmol/L (ref 98–111)
Creatinine, Ser: 1.61 mg/dL — ABNORMAL HIGH (ref 0.61–1.24)
GFR, Estimated: 43 mL/min — ABNORMAL LOW (ref >60)
Glucose, Bld: 93 mg/dL (ref 70–99)
Potassium: 4.4 mmol/L (ref 3.5–5.1)
Sodium: 140 mmol/L (ref 135–145)
Total Bilirubin: 0.7 mg/dL (ref 0.3–1.2)
Total Protein: 6.6 g/dL (ref 6.5–8.1)

## 2022-01-08 LAB — C-REACTIVE PROTEIN: CRP: 2.3 mg/dL — ABNORMAL HIGH (ref <1.0)

## 2022-01-08 MED ORDER — CHLORHEXIDINE GLUCONATE CLOTH 2 % EX PADS
6.0000 | MEDICATED_PAD | Freq: Every day | CUTANEOUS | Status: DC
  Administered 2022-01-08 – 2022-01-10 (×3): 6 via TOPICAL

## 2022-01-08 MED ORDER — POLYETHYLENE GLYCOL 3350 17 G PO PACK
17.0000 g | PACK | Freq: Two times a day (BID) | ORAL | Status: DC
  Administered 2022-01-08 – 2022-01-10 (×2): 17 g via ORAL
  Filled 2022-01-08 (×3): qty 1

## 2022-01-08 MED ORDER — ALUM & MAG HYDROXIDE-SIMETH 200-200-20 MG/5ML PO SUSP
30.0000 mL | ORAL | Status: DC | PRN
  Administered 2022-01-08 – 2022-01-09 (×3): 30 mL via ORAL
  Filled 2022-01-08 (×3): qty 30

## 2022-01-08 MED ORDER — SODIUM CHLORIDE 0.9% FLUSH
10.0000 mL | Freq: Two times a day (BID) | INTRAVENOUS | Status: DC
  Administered 2022-01-08 – 2022-01-10 (×3): 10 mL

## 2022-01-08 MED ORDER — ONDANSETRON HCL 4 MG/2ML IJ SOLN
4.0000 mg | Freq: Three times a day (TID) | INTRAMUSCULAR | Status: DC | PRN
  Administered 2022-01-08: 4 mg via INTRAVENOUS

## 2022-01-08 MED ORDER — CHLORHEXIDINE GLUCONATE CLOTH 2 % EX PADS: 6.0000 | MEDICATED_PAD | Freq: Every day | CUTANEOUS | Status: DC

## 2022-01-08 MED ORDER — ONDANSETRON HCL 4 MG/2ML IJ SOLN
INTRAMUSCULAR | Status: AC
  Filled 2022-01-08: qty 2

## 2022-01-08 MED ORDER — SODIUM CHLORIDE 0.9% FLUSH: 10.0000 mL | INTRAVENOUS | Status: DC | PRN

## 2022-01-08 NOTE — Assessment & Plan Note (Signed)
Noted  

## 2022-01-08 NOTE — Assessment & Plan Note (Signed)
Stable

## 2022-01-08 NOTE — Assessment & Plan Note (Signed)
Previous infection, still positive. Asymptomatic and not on treatment.

## 2022-01-08 NOTE — Progress Notes (Incomplete)
PROGRESS NOTE    Kevin Villegas  GMW:[id number] DOB: [birth year] DOA: 01/07/2022 PCP: Dettinger, [name], MD   Brief Narrative: No notes on file   Assessment & Plan:   No notes have been filed under this hospital service. Service: Hospitalist     DVT prophylaxis: *** Code Status:   Code Status: Full Code Family Communication: *** Disposition Plan: ***   Consultants:  ***  Procedures:  ***  Antimicrobials: ***    Subjective: ***  Objective: Vitals:   01/07/22 2030 01/07/22 2137 01/08/22 0151 01/08/22 0630  BP: (!) 134/101 (!) 156/86 (!) 158/89 (!) 141/92  Pulse: 77 75 72 72  Resp:  20 16 18   Temp:  97.8 F (36.6 C) 98.5 F (36.9 C) 97.6 F (36.4 C)  TempSrc:  Oral Oral Oral  SpO2: 97%  97% 97%  Weight:      Height:        Intake/Output Summary (Last 24 hours) at 01/08/2022 0931 Last data filed at 01/08/2022 0430 Gross per 24 hour  Intake 434.93 ml  Output 100 ml  Net 334.93 ml   Filed Weights   01/07/22 1541  Weight: 63.5 kg    Examination:  General exam: Appears calm and comfortable *** Respiratory system: Clear to auscultation. Respiratory effort normal. Cardiovascular system: S1 & S2 heard, RRR. No murmurs, rubs, gallops or clicks. Gastrointestinal system: Abdomen is nondistended, soft and nontender. No organomegaly or masses felt. Normal bowel sounds heard. Central nervous system: Alert and oriented. No focal neurological deficits. Musculoskeletal: No edema. No calf tenderness Skin: No cyanosis. No rashes Psychiatry: Judgement and insight appear normal. Mood & affect appropriate.     Data Reviewed: I have personally reviewed following labs and imaging studies  CBC Lab Results  Component Value Date   WBC 10.1 01/08/2022   RBC 3.75 (L) 01/08/2022   HGB 11.2 (L) 01/08/2022   HCT 35.6 (L) 01/08/2022   MCV 94.9 01/08/2022   MCH 29.9 01/08/2022   PLT 642 (H) 01/08/2022   MCHC 31.5 01/08/2022   RDW 16.7 (H) 01/08/2022   LYMPHSABS  1.5 01/08/2022   MONOABS 1.2 (H) 01/08/2022   EOSABS 0.4 01/08/2022   BASOSABS 0.1 44/02/4741     Last metabolic panel Lab Results  Component Value Date   NA 140 01/08/2022   K 4.4 01/08/2022   CL 107 01/08/2022   CO2 25 01/08/2022   BUN 31 (H) 01/08/2022   CREATININE 1.61 (H) 01/08/2022   GLUCOSE 93 01/08/2022   GFRNONAA 43 (L) 01/08/2022   GFRAA >60 11/24/2019   CALCIUM 8.7 (L) 01/08/2022   PHOS 3.1 06/16/2021   PROT 6.6 01/08/2022   ALBUMIN 3.0 (L) 01/08/2022   LABGLOB 3.3 12/31/2021   AGRATIO 1.2 12/31/2021   BILITOT 0.7 01/08/2022   ALKPHOS 62 01/08/2022   AST 33 01/08/2022   ALT 29 01/08/2022   ANIONGAP 8 01/08/2022    CBG (last 3)  No results for input(s): GLUCAP in the last 72 hours.   GFR: Estimated Creatinine Clearance: 32.9 mL/min (A) (by C-G formula based on SCr of 1.61 mg/dL (H)).  Coagulation Profile: No results for input(s): INR, PROTIME in the last 168 hours.  Recent Results (from the past 240 hour(s))  Resp Panel by RT-PCR (Flu A&B, Covid) Nasopharyngeal Swab     Status: Abnormal   Collection Time: 01/07/22  5:55 PM   Specimen: Nasopharyngeal Swab; Nasopharyngeal(NP) swabs in vial transport medium  Result Value Ref Range Status   SARS Coronavirus 2  by RT PCR POSITIVE (A) NEGATIVE Final    Comment: (NOTE) SARS-CoV-2 target nucleic acids are DETECTED.  The SARS-CoV-2 RNA is generally detectable in upper respiratory specimens during the acute phase of infection. Positive results are indicative of the presence of the identified virus, but do not rule out bacterial infection or co-infection with other pathogens not detected by the test. Clinical correlation with patient history and other diagnostic information is necessary to determine patient infection status. The expected result is Negative.  Fact Sheet for Patients: EntrepreneurPulse.com.au  Fact Sheet for Healthcare  Providers: IncredibleEmployment.be  This test is not yet approved or cleared by the [place] FDA and  has been authorized for detection and/or diagnosis of SARS-CoV-2 by FDA under an Emergency Use Authorization (EUA).  This EUA will remain in effect (meaning this test can be used) for the duration of  the COVID-19 declaration under Section 564(b)(1) of the A ct, 21 U.S.C. section 360bbb-3(b)(1), unless the authorization is terminated or revoked sooner.     Influenza A by PCR NEGATIVE NEGATIVE Final   Influenza B by PCR NEGATIVE NEGATIVE Final    Comment: (NOTE) The Xpert Xpress SARS-CoV-2/FLU/RSV plus assay is intended as an aid in the diagnosis of influenza from Nasopharyngeal swab specimens and should not be used as a sole basis for treatment. Nasal washings and aspirates are unacceptable for Xpert Xpress SARS-CoV-2/FLU/RSV testing.  Fact Sheet for Patients: EntrepreneurPulse.com.au  Fact Sheet for Healthcare Providers: IncredibleEmployment.be  This test is not yet approved or cleared by the [place] FDA and has been authorized for detection and/or diagnosis of SARS-CoV-2 by FDA under an Emergency Use Authorization (EUA). This EUA will remain in effect (meaning this test can be used) for the duration of the COVID-19 declaration under Section 564(b)(1) of the Act, 21 U.S.C. section 360bbb-3(b)(1), unless the authorization is terminated or revoked.  Performed at [facility], [address], [place], [place] [address]         Radiology Studies: CT ABDOMEN PELVIS WO CONTRAST  Result Date: 01/07/2022 CLINICAL DATA:  Abdominal pain.  History of colostomy. EXAM: CT ABDOMEN AND PELVIS WITHOUT CONTRAST TECHNIQUE: Multidetector CT imaging of the abdomen and pelvis was performed following the standard protocol without IV contrast. RADIATION DOSE REDUCTION: This exam was performed according to the  departmental dose-optimization program which includes automated exposure control, adjustment of the mA and/or kV according to patient size and/or use of iterative reconstruction technique. COMPARISON:  12/09/2021 FINDINGS: Lower chest: Pulmonary nodules again noted in the lung bases. 14 mm nodule right lower lobe on image 5/series 4 is stable in the interval. Similar appearance small left pleural effusion. Hepatobiliary: Scattered hypoattenuating lesions in the liver parenchyma are similar to prior and incompletely characterized on noncontrast imaging. Gallbladder surgically absent. No intrahepatic or extrahepatic biliary dilation. Pancreas: Status post distal pancreatectomy. Spleen: Status post splenectomy. Adrenals/Urinary Tract: No adrenal nodule or mass. Right renal atrophy again noted with right-sided hydroureteronephrosis. Double-J internal ureteral stent noted left kidney with associated percutaneous nephrostomy tube in the left kidney. Bladder is decompressed. Stomach/Bowel: Stomach is decompressed. Duodenum is normally positioned as is the ligament of Treitz. Distal duodenal stent is similar to prior. Small bowel loops are more dilated on today's study with fluid-filled small bowel dilated up to 4.8 cm diameter in the mid abdomen deep to the umbilicus. Abrupt transition zone is not clearly demonstrated but appears to be in the left pelvis, similar in location to the prior study (axial 64/2). Distal  small bowel loops of the pelvis are decompressed. Terminal ileum not well seen. The appendix is not well visualized, but there is no edema or inflammation in the region of the cecum. Left abdominal sigmoid end colostomy noted with Hartmann's pouch anatomy. Vascular/Lymphatic: There is mild atherosclerotic calcification of the abdominal aorta without aneurysm. There is no gastrohepatic or hepatoduodenal ligament lymphadenopathy. No retroperitoneal or mesenteric lymphadenopathy. No pelvic sidewall lymphadenopathy.  Reproductive: The prostate gland and seminal vesicles are unremarkable. Other: No intraperitoneal free fluid. Musculoskeletal: No worrisome lytic or sclerotic osseous abnormality. Stable appearance of soft tissue nodules anterior right abdominal wall. IMPRESSION: 1. Similar fluid-filled dilatation of proximal and mid small bowel with transition zone identified in the left pelvis. Imaging features suspicious for small bowel obstruction secondary to adhesion. 2. Left abdominal and sigmoid colostomy with Hartmann's pouch anatomy. 3. Left percutaneous nephrostomy tube with double-J left internal ureteral stent in place. 4. Right renal atrophy with right-sided hydroureteronephrosis, stable. 5. Stable appearance of small left pleural effusion. 6. Stable appearance of bilateral pulmonary nodules compatible with metastatic disease. 7. Status post distal pancreatectomy and splenectomy. 8. Aortic Atherosclerosis (ICD10-I70.0). Electronically Signed   By: Misty Cothron M.D.   On: 01/07/2022 16:29   DG CHEST PORT 1 VIEW  Result Date: 01/07/2022 CLINICAL DATA:  COVID. EXAM: PORTABLE CHEST 1 VIEW COMPARISON:  Chest x-ray 06/11/2021. CT abdomen and pelvis 01/07/2022. CT 11/26/2021. FINDINGS: Right chest port catheter tip projects over the mid SVC. There is a moderate-sized left pleural effusion which may be loculated near the apex. Loculation may be new from prior study. Bilateral pulmonary nodules are again seen which appear similar to prior CT 11/26/2021. No new focal lung infiltrate. No pneumothorax. Cardiomediastinal silhouette appears stable. Heart is enlarged aorta is ectatic. No acute fractures are seen. IMPRESSION: 1. Moderate left pleural effusion is likely partially loculated in the upper hemithorax. Loculation may be new from prior. 2. Grossly unchanged bilateral pulmonary nodules. 3. No new focal lung infiltrate. Electronically Signed   By: Ronney Asters M.D.   On: 01/07/2022 21:56        Scheduled Meds:   Chlorhexidine Gluconate Cloth  6 each Topical Daily   heparin  5,000 Units Subcutaneous Q8H   sodium chloride flush  10-40 mL Intracatheter Q12H   Continuous Infusions:  lactated ringers 100 mL/hr at 01/08/22 0406     LOS: 1 day     Cordelia Poche, MD [facility] 01/08/2022, 9:31 AM  If 7PM-7AM, please contact night-coverage [email or web address]

## 2022-01-08 NOTE — Progress Notes (Signed)
PROGRESS NOTE    Kevin Villegas  CHE:[id number] DOB: 08/30/1941 DOA: 01/07/2022 PCP: Dettinger, [name], MD   Brief Narrative: No notes on file   Assessment & Plan:   * SBO (small bowel obstruction) ([place])- (present on admission) General surgery consulted on admission. CT abdomen/pelvis suggests left pelvis region obstruction with transition point. Small bowel protocol initiated with contrast seen in the colon. General surgery recommending non-surgical/conservative management at this time. Diet advanced to a soft diet this morning.  Colostomy in place [facility]) Noted.  Hydronephrosis of left kidney- (present on admission) Present on admission. History of nephrostomy tubes and ureteral stent placement. Stable.  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 Follows with Dr. Marin Olp  COVID-19 virus infection- (present on admission) Previous infection, still positive. Asymptomatic and not on treatment.  Stage 3a chronic kidney disease (CKD) ([place])- (present on admission) Stable.  Hydroureter on right- (present on admission) Stable.      -----     DVT prophylaxis: Heparin subcu Code Status:   Code Status: Full Code Family Communication: None at bedside Disposition Plan: Discharge home in 1-2 days pending general surgery recommendations   Consultants:  General surgery  Procedures:  None  Antimicrobials: None    Subjective: No abdominal pain, nausea or vomiting.  Objective: Vitals:   01/07/22 2137 01/08/22 0151 01/08/22 0630 01/08/22 1340  BP: (!) 156/86 (!) 158/89 (!) 141/92 (!) 146/87  Pulse: 75 72 72 69  Resp: 20 16 18 18   Temp: 97.8 F (36.6 C) 98.5 F (36.9 C) 97.6 F (36.4 C) 98.3 F (36.8 C)  TempSrc: Oral Oral Oral Oral  SpO2:  97% 97% 98%  Weight:      Height:        Intake/Output Summary (Last 24 hours) at 01/08/2022 1526 Last data filed at 01/08/2022 0430 Gross per 24 hour   Intake 434.93 ml  Output 100 ml  Net 334.93 ml   Filed Weights   01/07/22 1541  Weight: 63.5 kg    Examination:  General exam: Appears calm and comfortable  Respiratory system: Clear to auscultation. Respiratory effort normal. Cardiovascular system: S1 & S2 heard, RRR. No murmurs, rubs, gallops or clicks. Gastrointestinal system: Abdomen is nondistended, soft and nontender. Colostomy pouch present. Normal bowel sounds heard. Central nervous system: Alert and oriented. No focal neurological deficits. Musculoskeletal: No calf tenderness Skin: No cyanosis. No rashes Psychiatry: Judgement and insight appear normal. Mood & affect appropriate.     Data Reviewed: I have personally reviewed following labs and imaging studies  CBC Lab Results  Component Value Date   WBC 10.1 01/08/2022   RBC 3.75 (L) 01/08/2022   HGB 11.2 (L) 01/08/2022   HCT 35.6 (L) 01/08/2022   MCV 94.9 01/08/2022   MCH 29.9 01/08/2022   PLT 642 (H) 01/08/2022   MCHC 31.5 01/08/2022   RDW 16.7 (H) 01/08/2022   LYMPHSABS 1.5 01/08/2022   MONOABS 1.2 (H) 01/08/2022   EOSABS 0.4 01/08/2022   BASOSABS 0.1 53/61/4431     Last metabolic panel Lab Results  Component Value Date   NA 140 01/08/2022   K 4.4 01/08/2022   CL 107 01/08/2022   CO2 25 01/08/2022   BUN 31 (H) 01/08/2022   CREATININE 1.61 (H) 01/08/2022   GLUCOSE 93 01/08/2022   GFRNONAA 43 (L) 01/08/2022   GFRAA >60 11/24/2019   CALCIUM 8.7 (L) 01/08/2022   PHOS 3.1 06/16/2021   PROT 6.6 01/08/2022  ALBUMIN 3.0 (L) 01/08/2022   LABGLOB 3.3 12/31/2021   AGRATIO 1.2 12/31/2021   BILITOT 0.7 01/08/2022   ALKPHOS 62 01/08/2022   AST 33 01/08/2022   ALT 29 01/08/2022   ANIONGAP 8 01/08/2022    CBG (last 3)  No results for input(s): GLUCAP in the last 72 hours.   GFR: Estimated Creatinine Clearance: 32.9 mL/min (A) (by C-G formula based on SCr of 1.61 mg/dL (H)).  Coagulation Profile: No results for input(s): INR, PROTIME in the last  168 hours.  Recent Results (from the past 240 hour(s))  Resp Panel by RT-PCR (Flu A&B, Covid) Nasopharyngeal Swab     Status: Abnormal   Collection Time: 01/07/22  5:55 PM   Specimen: Nasopharyngeal Swab; Nasopharyngeal(NP) swabs in vial transport medium  Result Value Ref Range Status   SARS Coronavirus 2 by RT PCR POSITIVE (A) NEGATIVE Final    Comment: (NOTE) SARS-CoV-2 target nucleic acids are DETECTED.  The SARS-CoV-2 RNA is generally detectable in upper respiratory specimens during the acute phase of infection. Positive results are indicative of the presence of the identified virus, but do not rule out bacterial infection or co-infection with other pathogens not detected by the test. Clinical correlation with patient history and other diagnostic information is necessary to determine patient infection status. The expected result is Negative.  Fact Sheet for Patients: EntrepreneurPulse.com.au  Fact Sheet for Healthcare Providers: IncredibleEmployment.be  This test is not yet approved or cleared by the [place] FDA and  has been authorized for detection and/or diagnosis of SARS-CoV-2 by FDA under an Emergency Use Authorization (EUA).  This EUA will remain in effect (meaning this test can be used) for the duration of  the COVID-19 declaration under Section 564(b)(1) of the A ct, 21 U.S.C. section 360bbb-3(b)(1), unless the authorization is terminated or revoked sooner.     Influenza A by PCR NEGATIVE NEGATIVE Final   Influenza B by PCR NEGATIVE NEGATIVE Final    Comment: (NOTE) The Xpert Xpress SARS-CoV-2/FLU/RSV plus assay is intended as an aid in the diagnosis of influenza from Nasopharyngeal swab specimens and should not be used as a sole basis for treatment. Nasal washings and aspirates are unacceptable for Xpert Xpress SARS-CoV-2/FLU/RSV testing.  Fact Sheet for Patients: EntrepreneurPulse.com.au  Fact Sheet  for Healthcare Providers: IncredibleEmployment.be  This test is not yet approved or cleared by the [place] FDA and has been authorized for detection and/or diagnosis of SARS-CoV-2 by FDA under an Emergency Use Authorization (EUA). This EUA will remain in effect (meaning this test can be used) for the duration of the COVID-19 declaration under Section 564(b)(1) of the Act, 21 U.S.C. section 360bbb-3(b)(1), unless the authorization is terminated or revoked.  Performed at [facility], [address] [place], [place] [address]         Radiology Studies: CT ABDOMEN PELVIS WO CONTRAST  Result Date: 01/07/2022 CLINICAL DATA:  Abdominal pain.  History of colostomy. EXAM: CT ABDOMEN AND PELVIS WITHOUT CONTRAST TECHNIQUE: Multidetector CT imaging of the abdomen and pelvis was performed following the standard protocol without IV contrast. RADIATION DOSE REDUCTION: This exam was performed according to the departmental dose-optimization program which includes automated exposure control, adjustment of the mA and/or kV according to patient size and/or use of iterative reconstruction technique. COMPARISON:  12/09/2021 FINDINGS: Lower chest: Pulmonary nodules again noted in the lung bases. 14 mm nodule right lower lobe on image 5/series 4 is stable in the interval. Similar appearance small left pleural effusion.  Hepatobiliary: Scattered hypoattenuating lesions in the liver parenchyma are similar to prior and incompletely characterized on noncontrast imaging. Gallbladder surgically absent. No intrahepatic or extrahepatic biliary dilation. Pancreas: Status post distal pancreatectomy. Spleen: Status post splenectomy. Adrenals/Urinary Tract: No adrenal nodule or mass. Right renal atrophy again noted with right-sided hydroureteronephrosis. Double-J internal ureteral stent noted left kidney with associated percutaneous nephrostomy tube in the left kidney. Bladder is decompressed.  Stomach/Bowel: Stomach is decompressed. Duodenum is normally positioned as is the ligament of Treitz. Distal duodenal stent is similar to prior. Small bowel loops are more dilated on today's study with fluid-filled small bowel dilated up to 4.8 cm diameter in the mid abdomen deep to the umbilicus. Abrupt transition zone is not clearly demonstrated but appears to be in the left pelvis, similar in location to the prior study (axial 64/2). Distal small bowel loops of the pelvis are decompressed. Terminal ileum not well seen. The appendix is not well visualized, but there is no edema or inflammation in the region of the cecum. Left abdominal sigmoid end colostomy noted with Hartmann's pouch anatomy. Vascular/Lymphatic: There is mild atherosclerotic calcification of the abdominal aorta without aneurysm. There is no gastrohepatic or hepatoduodenal ligament lymphadenopathy. No retroperitoneal or mesenteric lymphadenopathy. No pelvic sidewall lymphadenopathy. Reproductive: The prostate gland and seminal vesicles are unremarkable. Other: No intraperitoneal free fluid. Musculoskeletal: No worrisome lytic or sclerotic osseous abnormality. Stable appearance of soft tissue nodules anterior right abdominal wall. IMPRESSION: 1. Similar fluid-filled dilatation of proximal and mid small bowel with transition zone identified in the left pelvis. Imaging features suspicious for small bowel obstruction secondary to adhesion. 2. Left abdominal and sigmoid colostomy with Hartmann's pouch anatomy. 3. Left percutaneous nephrostomy tube with double-J left internal ureteral stent in place. 4. Right renal atrophy with right-sided hydroureteronephrosis, stable. 5. Stable appearance of small left pleural effusion. 6. Stable appearance of bilateral pulmonary nodules compatible with metastatic disease. 7. Status post distal pancreatectomy and splenectomy. 8. Aortic Atherosclerosis (ICD10-I70.0). Electronically Signed   By: Misty Herrle M.D.   On:  01/07/2022 16:29   DG CHEST PORT 1 VIEW  Result Date: 01/07/2022 CLINICAL DATA:  COVID. EXAM: PORTABLE CHEST 1 VIEW COMPARISON:  Chest x-ray 06/11/2021. CT abdomen and pelvis 01/07/2022. CT 11/26/2021. FINDINGS: Right chest port catheter tip projects over the mid SVC. There is a moderate-sized left pleural effusion which may be loculated near the apex. Loculation may be new from prior study. Bilateral pulmonary nodules are again seen which appear similar to prior CT 11/26/2021. No new focal lung infiltrate. No pneumothorax. Cardiomediastinal silhouette appears stable. Heart is enlarged aorta is ectatic. No acute fractures are seen. IMPRESSION: 1. Moderate left pleural effusion is likely partially loculated in the upper hemithorax. Loculation may be new from prior. 2. Grossly unchanged bilateral pulmonary nodules. 3. No new focal lung infiltrate. Electronically Signed   By: Ronney Asters M.D.   On: 01/07/2022 21:56   DG Abd Portable 1V-Small Bowel Obstruction Protocol-initial, 8 hr delay  Result Date: 01/08/2022 CLINICAL DATA:  Small-bowel obstruction follow-up EXAM: PORTABLE ABDOMEN - 1 VIEW COMPARISON:  CT abdomen and pelvis 01/07/2022 FINDINGS: Abnormal gas-filled distended loops of small bowel in the mid and upper abdomen measuring up to 4.9 cm in diameter. The colon is normal caliber. No suspicious calcifications identified. Cholecystectomy clips. Left ureteral stent and left percutaneous nephrostomy tube. IMPRESSION: Persistent abnormal distended small bowel suggesting obstruction. Electronically Signed   By: Ofilia Neas M.D.   On: 01/08/2022 10:27  Scheduled Meds:  Chlorhexidine Gluconate Cloth  6 each Topical Daily   heparin  5,000 Units Subcutaneous Q8H   polyethylene glycol  17 g Oral BID   sodium chloride flush  10-40 mL Intracatheter Q12H   Continuous Infusions:  lactated ringers 100 mL/hr at 01/08/22 1425     LOS: 1 day     Cordelia Poche, MD [facility] 01/08/2022, 3:26 PM  If 7PM-7AM, please contact night-coverage [email or web address]

## 2022-01-08 NOTE — Progress Notes (Signed)
Subjective/Chief Complaint: Patient denies abdominal pain nausea or vomiting.  Ostomy working.  8-hour post study film shows contrast in the colon.  Mild small bowel dilation.   Objective: Vital signs in last 24 hours: Temp:  [97.6 F (36.4 C)-98.5 F (36.9 C)] 97.6 F (36.4 C) (01/21 0630) Pulse Rate:  [72-96] 72 (01/21 0630) Resp:  [16-20] 18 (01/21 0630) BP: (105-158)/(85-102) 141/92 (01/21 0630) SpO2:  [93 %-99 %] 97 % (01/21 0630) Weight:  [63.5 kg] 63.5 kg (01/20 1541) Last BM Date: 01/08/22  Intake/Output from previous day: 01/20 0701 - 01/21 0700 In: 434.9 [I.V.:434.9] Out: 100 [Urine:100] Intake/Output this shift: No intake/output data recorded.  General appearance: alert and cooperative Resp: clear to auscultation bilaterally Cardio: regular rate and rhythm, S1, S2 normal, no murmur, click, rub or gallop Incision/Wound: Soft.  Nontender.  Sick centimeter right lower quadrant nodule noted.  Ostomy pink and functioning.  Lab Results:  Recent Labs    01/07/22 1607 01/08/22 0422  WBC 11.7* 10.1  HGB 13.1 11.2*  HCT 40.2 35.6*  PLT 618* 642*   BMET Recent Labs    01/07/22 1607 01/08/22 0422  NA 136 140  K 4.9 4.4  CL 103 107  CO2 22 25  GLUCOSE 109* 93  BUN 31* 31*  CREATININE 1.72* 1.61*  CALCIUM 9.2 8.7*   PT/INR No results for input(s): LABPROT, INR in the last 72 hours. ABG No results for input(s): PHART, HCO3 in the last 72 hours.  Invalid input(s): PCO2, PO2  Studies/Results: CT ABDOMEN PELVIS WO CONTRAST  Result Date: 01/07/2022 CLINICAL DATA:  Abdominal pain.  History of colostomy. EXAM: CT ABDOMEN AND PELVIS WITHOUT CONTRAST TECHNIQUE: Multidetector CT imaging of the abdomen and pelvis was performed following the standard protocol without IV contrast. RADIATION DOSE REDUCTION: This exam was performed according to the departmental dose-optimization program which includes automated exposure control, adjustment of the mA and/or kV  according to patient size and/or use of iterative reconstruction technique. COMPARISON:  12/09/2021 FINDINGS: Lower chest: Pulmonary nodules again noted in the lung bases. 14 mm nodule right lower lobe on image 5/series 4 is stable in the interval. Similar appearance small left pleural effusion. Hepatobiliary: Scattered hypoattenuating lesions in the liver parenchyma are similar to prior and incompletely characterized on noncontrast imaging. Gallbladder surgically absent. No intrahepatic or extrahepatic biliary dilation. Pancreas: Status post distal pancreatectomy. Spleen: Status post splenectomy. Adrenals/Urinary Tract: No adrenal nodule or mass. Right renal atrophy again noted with right-sided hydroureteronephrosis. Double-J internal ureteral stent noted left kidney with associated percutaneous nephrostomy tube in the left kidney. Bladder is decompressed. Stomach/Bowel: Stomach is decompressed. Duodenum is normally positioned as is the ligament of Treitz. Distal duodenal stent is similar to prior. Small bowel loops are more dilated on today's study with fluid-filled small bowel dilated up to 4.8 cm diameter in the mid abdomen deep to the umbilicus. Abrupt transition zone is not clearly demonstrated but appears to be in the left pelvis, similar in location to the prior study (axial 64/2). Distal small bowel loops of the pelvis are decompressed. Terminal ileum not well seen. The appendix is not well visualized, but there is no edema or inflammation in the region of the cecum. Left abdominal sigmoid end colostomy noted with Hartmann's pouch anatomy. Vascular/Lymphatic: There is mild atherosclerotic calcification of the abdominal aorta without aneurysm. There is no gastrohepatic or hepatoduodenal ligament lymphadenopathy. No retroperitoneal or mesenteric lymphadenopathy. No pelvic sidewall lymphadenopathy. Reproductive: The prostate gland and seminal vesicles are unremarkable. Other: No  intraperitoneal free fluid.  Musculoskeletal: No worrisome lytic or sclerotic osseous abnormality. Stable appearance of soft tissue nodules anterior right abdominal wall. IMPRESSION: 1. Similar fluid-filled dilatation of proximal and mid small bowel with transition zone identified in the left pelvis. Imaging features suspicious for small bowel obstruction secondary to adhesion. 2. Left abdominal and sigmoid colostomy with Hartmann's pouch anatomy. 3. Left percutaneous nephrostomy tube with double-J left internal ureteral stent in place. 4. Right renal atrophy with right-sided hydroureteronephrosis, stable. 5. Stable appearance of small left pleural effusion. 6. Stable appearance of bilateral pulmonary nodules compatible with metastatic disease. 7. Status post distal pancreatectomy and splenectomy. 8. Aortic Atherosclerosis (ICD10-I70.0). Electronically Signed   By: Misty Lapidus M.D.   On: 01/07/2022 16:29   DG CHEST PORT 1 VIEW  Result Date: 01/07/2022 CLINICAL DATA:  COVID. EXAM: PORTABLE CHEST 1 VIEW COMPARISON:  Chest x-ray 06/11/2021. CT abdomen and pelvis 01/07/2022. CT 11/26/2021. FINDINGS: Right chest port catheter tip projects over the mid SVC. There is a moderate-sized left pleural effusion which may be loculated near the apex. Loculation may be new from prior study. Bilateral pulmonary nodules are again seen which appear similar to prior CT 11/26/2021. No new focal lung infiltrate. No pneumothorax. Cardiomediastinal silhouette appears stable. Heart is enlarged aorta is ectatic. No acute fractures are seen. IMPRESSION: 1. Moderate left pleural effusion is likely partially loculated in the upper hemithorax. Loculation may be new from prior. 2. Grossly unchanged bilateral pulmonary nodules. 3. No new focal lung infiltrate. Electronically Signed   By: Ronney Asters M.D.   On: 01/07/2022 21:56   DG Abd Portable 1V-Small Bowel Obstruction Protocol-initial, 8 hr delay  Result Date: 01/08/2022 CLINICAL DATA:  Small-bowel obstruction  follow-up EXAM: PORTABLE ABDOMEN - 1 VIEW COMPARISON:  CT abdomen and pelvis 01/07/2022 FINDINGS: Abnormal gas-filled distended loops of small bowel in the mid and upper abdomen measuring up to 4.9 cm in diameter. The colon is normal caliber. No suspicious calcifications identified. Cholecystectomy clips. Left ureteral stent and left percutaneous nephrostomy tube. IMPRESSION: Persistent abnormal distended small bowel suggesting obstruction. Electronically Signed   By: Ofilia Neas M.D.   On: 01/08/2022 10:27    Anti-infectives: Anti-infectives (From admission, onward)    None       Assessment/Plan: Patient Active Problem List   Diagnosis Date Noted   SBO (small bowel obstruction) ([place]) 01/07/2022   COVID-19 virus infection 01/07/2022   Colostomy in place [facility]) 11/10/2021   Stage 3a chronic kidney disease (CKD) ([place]) 11/10/2021   Pyelonephritis    Essential hypertension 10/30/2021   Hydronephrosis of left kidney 10/28/2021   Ureteral stricture, left 10/27/2021   Protein-calorie malnutrition, severe 06/12/2021   Hydroureter on right 06/10/2021   Aortic atherosclerosis ([place]) 06/10/2021   Right ureteral stone 06/02/2021   Colon stricture ([place])    Pancreatic mass 06/27/2019   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  with metastatic disease 06/27/2019   Rhinitis medicamentosa 02/26/2019     8-hour film shows contrast in the colon.  This is not consistent with bowel obstruction.  My guess he has an element of intra-abdominal malignancy/carcinomatosis.  His tumor seems to be fairly slow-growing looking at his natural history but he does have stage IV disease.  He does not require any urgent operative intervention for this.  Recommend a low residue diet going forward.  Recommend MiraLAX 2-3 times a day to keep his bowel function within normal limits.  Discuss repercussions of surgery in his case depending on findings and discussed  potential complications of morbidity or  mortality of being over 12%, discharged to nursing home being 25%, wound infection rates being 48%, exacerbation of renal failure being 8 to 12%.  I explained him his quality life would be very different if we had to operate but this point time he is no acute surgical indications and hopefully can manage him medically.  I recommend long-term planning given his age and the fact he will receive no further chemotherapy/nor does he want/it for his stage IV disease which is a terminal event.  Goals of care should be clearly established with him.   LOS: 1 day  Total time 1 hour for face-to-face, discussion of plan, chart review, documentation,  Turner Daniels MD 01/08/2022

## 2022-01-08 NOTE — Assessment & Plan Note (Signed)
Noted. Follows with Dr. Marin Olp

## 2022-01-08 NOTE — Plan of Care (Signed)
  Problem: Activity: Goal: Risk for activity intolerance will decrease Outcome: Progressing   Problem: Pain Managment: Goal: General experience of comfort will improve Outcome: Progressing   Problem: Safety: Goal: Ability to remain free from injury will improve Outcome: Progressing   

## 2022-01-08 NOTE — Consult Note (Signed)
Referral MD  Reason for Referral: Recurrent bowel obstruction; metastatic pancreatic cancer  Chief Complaint  Patient presents with   Constipation  : I had abdominal pain again.  HPI: Kevin Villegas is well-known to me.  Is very nice 81 year old white male.  He has metastatic pancreatic cancer.  We would not treated him since we have been seeing him.  He has had multiple hospital admissions because of bowel obstruction.  He ultimately underwent a diverting colostomy in June 2022.  This is for a pelvic obstruction.  However, it was felt that this likely was from obstruction secondary to adhesions.  Unfortunately, no biopsies were taken at the time.  He subsequently had problems in November.  He had urinary issues.  He had a cystoscopy that was done which showed that there was metastatic disease.  He had a PET scan done in December.  This showed pulmonary metastasis which were slightly increased in size.  He had a increased small left pleural effusion.  Had decrease in hypermetabolic pancreatic lesion.  He had 2 hypermetabolic densities in the anterior abdominal wall.  He had been doing well over the holidays.  He has a diverting colostomy in place.  His last CA 19-9 was stable at 10.  He now has another bowel obstruction.  He had a CT scan of the abdomen pelvis on 01/07/2022.  This showed the partial small bowel obstruction.  However, the radiologist felt that this was more likely secondary to adhesions.  He has been seen by surgery.  They feel that the obstruction is secondary to malignancy.  I do not feel that he would be served well by surgery.  He is has had no vomiting.  He just has had the abdominal pain.  There is no bleeding.  He has had some nausea.  There is been no leg swelling.  He has had no rashes.  His labs today show white cell count 10.1.  Hemoglobin 9.2.  Platelet count 242,000.  His BUN 31 creatinine 1.6.  Calcium 8.7 with an albumin of 3.  Overall, he has maybe lost a  little bit of weight.  His overall performance status is probably ECOG 1.    Past Medical History:  Diagnosis Date   Colostomy present [facility])    Large bowel obstruction ([place])    Pancreatic cancer ([place])   :   Past Surgical History:  Procedure Laterality Date   ABDOMINAL SURGERY     BIOPSY  05/20/2021   Procedure: BIOPSY;  Surgeon: Otis Brace, MD;  Location: [facility];  Service: Gastroenterology;;   BOWEL RESECTION     BOWEL RESECTION     CHOLECYSTECTOMY N/A 12/16/2014   Procedure: LAPAROSCOPIC CHOLECYSTECTOMY WITH INTRAOPERATIVE CHOLANGIOGRAM;  Surgeon: Georganna Skeans, MD;  Location: [facility];  Service: General;  Laterality: N/A;   COLONIC STENT PLACEMENT N/A 05/26/2021   Procedure: COLONIC STENT PLACEMENT;  Surgeon: Clarene Essex, MD;  Location: [facility];  Service: Endoscopy;  Laterality: N/A;   CYSTOSCOPY W/ URETERAL STENT PLACEMENT Left 10/27/2021   Procedure: CYSTOSCOPY WITH RETROGRADE PYELOGRAM/URETERAL STENT PLACEMENT;  Surgeon: Raynelle Bring, MD;  Location: [facility];  Service: Urology;  Laterality: Left;   ESOPHAGOGASTRODUODENOSCOPY N/A 05/20/2021   Procedure: ESOPHAGOGASTRODUODENOSCOPY (EGD);  Surgeon: Otis Brace, MD;  Location: [facility];  Service: Gastroenterology;  Laterality: N/A;   EUS N/A 09/24/2015   Procedure: UPPER ENDOSCOPIC ULTRASOUND (EUS) LINEAR;  Surgeon: Milus Banister, MD;  Location: [facility];  Service: Endoscopy;  Laterality: N/A;   FLEXIBLE SIGMOIDOSCOPY N/A 05/20/2021  Procedure: FLEXIBLE SIGMOIDOSCOPY;  Surgeon: Otis Brace, MD;  [facility];  Service: Gastroenterology;  Laterality: N/A;   FLEXIBLE SIGMOIDOSCOPY N/A 05/26/2021   Procedure: FLEXIBLE SIGMOIDOSCOPY;  Surgeon: Clarene Essex, MD;  [facility];  Service: Endoscopy;  Laterality: N/A;   IR NEPHROSTOMY EXCHANGE LEFT  12/23/2021   IR NEPHROSTOMY PLACEMENT LEFT  11/11/2021   IR NEPHROSTOMY PLACEMENT RIGHT  11/11/2021   LAPAROSCOPIC DISTAL  PANCREATECTOMY  10/2017   [facility]   LAPAROSCOPIC PARTIAL GASTRECTOMY  10/2017   [facility]   LAPAROSCOPIC SIGMOID COLECTOMY N/A 06/14/2021   Procedure: LAPAROSCOPY WITH MOBILIZATION, PROXIMAL DIVERTING COLOSTOMY, CREATION OF DISTAL MUCOUS FISTULA;  Surgeon: Johnathan Hausen, MD;  Location: [facility];  Service: General;  Laterality: N/A;   LAPAROSCOPIC SPLENECTOMY  10/2017   [facility]   VASECTOMY    :   Current Facility-Administered Medications:    acetaminophen (TYLENOL) tablet 650 mg, 650 mg, Oral, Q6H PRN, 650 mg at 01/07/22 2313 **OR** acetaminophen (TYLENOL) suppository 650 mg, 650 mg, Rectal, Q6H PRN, Rise Patience, MD   Chlorhexidine Gluconate Cloth 2 % PADS 6 each, 6 each, Topical, Daily, Rise Patience, MD   fentaNYL (SUBLIMAZE) injection 12.5 mcg, 12.5 mcg, Intravenous, Q2H PRN, Rise Patience, MD   heparin injection 5,000 Units, 5,000 Units, Subcutaneous, Q8H, Rise Patience, MD   lactated ringers infusion, , Intravenous, Continuous, Rise Patience, MD, Last Rate: 100 mL/hr at 01/08/22 0406, New Bag at 01/08/22 0406   sodium chloride flush (NS) 0.9 % injection 10-40 mL, 10-40 mL, Intracatheter, Q12H, Rise Patience, MD, 10 mL at 01/08/22 0430   sodium chloride flush (NS) 0.9 % injection 10-40 mL, 10-40 mL, Intracatheter, PRN, Rise Patience, MD:   Chlorhexidine Gluconate Cloth  6 each Topical Daily   heparin  5,000 Units Subcutaneous Q8H   sodium chloride flush  10-40 mL Intracatheter Q12H  :   Allergies  Allergen Reactions   Pollen Extract Other (See Comments)    Sinus congestion  :   Family History  Problem Relation Age of Onset   Stroke Mother   :   Social History   Socioeconomic History   Marital status: Married    Spouse name: Not on file   Number of children: 2   Years of education: Not on file   Highest education level: Not on file  Occupational History   Occupation: retired  Tobacco Use   Smoking status: Never   Smokeless  tobacco: Never  Vaping Use   Vaping Use: Never used  Substance and Sexual Activity   Alcohol use: No    Alcohol/week: 0.0 standard drinks   Drug use: No   Sexual activity: Yes  Other Topics Concern   Not on file  Social History Narrative   Not on file   Social Determinants of Health   Financial Resource Strain: Not on file  Food Insecurity: Not on file  Transportation Needs: Not on file  Physical Activity: Not on file  Stress: Not on file  Social Connections: Not on file  Intimate Partner Violence: Not on file  :  Pertinent items are noted in HPI.  Exam: Patient Vitals for the past 24 hrs:  BP Temp Temp src Pulse Resp SpO2 Height Weight  01/08/22 0630 (!) 141/92 97.6 F (36.4 C) Oral 72 18 97 % -- --  01/08/22 0151 (!) 158/89 98.5 F (36.9 C) Oral 72 16 97 % -- --  01/07/22 2137 (!) 156/86 97.8 F (36.6 C) Oral 75  20 -- -- --  01/07/22 2030 (!) 134/101 -- -- 77 -- 97 % -- --  01/07/22 2000 (!) 140/102 -- -- 76 -- 99 % -- --  01/07/22 1915 (!) 129/92 -- -- 80 -- 93 % -- --  01/07/22 1700 126/90 -- -- 81 18 98 % -- --  01/07/22 1541 -- -- -- -- -- -- 5\' 11"  (1.803 m) 140 lb (63.5 kg)  01/07/22 1540 105/85 97.7 F (36.5 C) Oral 96 20 97 % -- --   Physical Exam Vitals reviewed.  HENT:     Head: Normocephalic and atraumatic.  Eyes:     Pupils: Pupils are equal, round, and reactive to light.  Cardiovascular:     Rate and Rhythm: Normal rate and regular rhythm.     Heart sounds: Normal heart sounds.  Pulmonary:     Effort: Pulmonary effort is normal.     Breath sounds: Normal breath sounds.  Abdominal:     General: Bowel sounds are normal.     Palpations: Abdomen is soft.     Comments: His abdomen is soft.  Bowel sounds are decreased.  He has a colostomy in the left side.  He has no obvious fluid wave.  He does have his subcutaneous nodule over in the right abdomen that is firm.  It is slightly tender.  It is mobile.  There is no palpable liver or spleen tip.   Musculoskeletal:        General: No tenderness or deformity. Normal range of motion.     Cervical back: Normal range of motion.  Lymphadenopathy:     Cervical: No cervical adenopathy.  Skin:    General: Skin is warm and dry.     Findings: No erythema or rash.  Neurological:     Mental Status: He is alert and oriented to person, place, and time.  Psychiatric:        Behavior: Behavior normal.        Thought Content: Thought content normal.        Judgment: Judgment normal.      Recent Labs    01/07/22 1607 01/08/22 0422  WBC 11.7* 10.1  HGB 13.1 11.2*  HCT 40.2 35.6*  PLT 618* 642*    Recent Labs    01/07/22 1607 01/08/22 0422  NA 136 140  K 4.9 4.4  CL 103 107  CO2 22 25  GLUCOSE 109* 93  BUN 31* 31*  CREATININE 1.72* 1.61*  CALCIUM 9.2 8.7*    Blood smear review: None  Pathology: None    Assessment and Plan: Kevin Villegas is a very nice 81 year old white male.  He has metastatic pancreatic cancer.  We have never treated him with chemotherapy.  He is trying to avoid this.  I think a long way that we will know if this is a malignant obstruction is surgery.  However, I totally understand surgeries reluctance to operate.  The fact that I really do not hear a lot of bowel sounds could certainly indicate he may have a malignant pseudoobstruction.  I think it be worthwhile biopsying this right abdominal wall mass.  We could see if this is malignant.  As such, this might give [place] a reason to consider chemotherapy with him.  Again, I know he is try to avoid chemotherapy.  We will see if radiology can biopsy this abdominal wall mass.  If so, we can hopefully get molecular markers to try to see if there is any targeted therapy that  could be considered.  Hopefully, he will not require any type of NG tube.  His labs all look that bad.  I will send off another CA 19-9.  His tumor marker really has never been elevated.  Maybe his malignancy just does not produce any of  this protein.  We will follow along and try to help out any way that we can.  Lattie Haw, MD  Psalms 41:3

## 2022-01-08 NOTE — Assessment & Plan Note (Addendum)
General surgery consulted on admission. CT abdomen/pelvis suggests left pelvis region obstruction with transition point. Small bowel protocol initiated with contrast seen in the colon. General surgery recommending non-surgical/conservative management at this time. Diet advanced to a soft diet. Recommendation for a low fiber diet on discharge.

## 2022-01-08 NOTE — Assessment & Plan Note (Signed)
Present on admission. History of nephrostomy tubes and ureteral stent placement. Stable.

## 2022-01-09 DIAGNOSIS — K56609 Unspecified intestinal obstruction, unspecified as to partial versus complete obstruction: Secondary | ICD-10-CM | POA: Diagnosis not present

## 2022-01-09 DIAGNOSIS — R1900 Intra-abdominal and pelvic swelling, mass and lump, unspecified site: Secondary | ICD-10-CM

## 2022-01-09 DIAGNOSIS — Z933 Colostomy status: Secondary | ICD-10-CM | POA: Diagnosis not present

## 2022-01-09 DIAGNOSIS — N1330 Unspecified hydronephrosis: Secondary | ICD-10-CM | POA: Diagnosis not present

## 2022-01-09 DIAGNOSIS — R222 Localized swelling, mass and lump, trunk: Secondary | ICD-10-CM | POA: Insufficient documentation

## 2022-01-09 DIAGNOSIS — U071 COVID-19: Secondary | ICD-10-CM | POA: Diagnosis not present

## 2022-01-09 LAB — COMPREHENSIVE METABOLIC PANEL WITH GFR
ALT: 24 U/L (ref 0–44)
AST: 24 U/L (ref 15–41)
Albumin: 2.8 g/dL — ABNORMAL LOW (ref 3.5–5.0)
Alkaline Phosphatase: 63 U/L (ref 38–126)
Anion gap: 9 (ref 5–15)
BUN: 28 mg/dL — ABNORMAL HIGH (ref 8–23)
CO2: 26 mmol/L (ref 22–32)
Calcium: 8.6 mg/dL — ABNORMAL LOW (ref 8.9–10.3)
Chloride: 101 mmol/L (ref 98–111)
Creatinine, Ser: 1.64 mg/dL — ABNORMAL HIGH (ref 0.61–1.24)
GFR, Estimated: 42 mL/min — ABNORMAL LOW
Glucose, Bld: 90 mg/dL (ref 70–99)
Potassium: 4.5 mmol/L (ref 3.5–5.1)
Sodium: 136 mmol/L (ref 135–145)
Total Bilirubin: 0.6 mg/dL (ref 0.3–1.2)
Total Protein: 6.6 g/dL (ref 6.5–8.1)

## 2022-01-09 LAB — CBC WITH DIFFERENTIAL/PLATELET
Abs Immature Granulocytes: 0.05 K/uL (ref 0.00–0.07)
Basophils Absolute: 0.1 K/uL (ref 0.0–0.1)
Basophils Relative: 1 %
Eosinophils Absolute: 0.3 K/uL (ref 0.0–0.5)
Eosinophils Relative: 3 %
HCT: 36.2 % — ABNORMAL LOW (ref 39.0–52.0)
Hemoglobin: 11.5 g/dL — ABNORMAL LOW (ref 13.0–17.0)
Immature Granulocytes: 0 %
Lymphocytes Relative: 12 %
Lymphs Abs: 1.5 K/uL (ref 0.7–4.0)
MCH: 29.8 pg (ref 26.0–34.0)
MCHC: 31.8 g/dL (ref 30.0–36.0)
MCV: 93.8 fL (ref 80.0–100.0)
Monocytes Absolute: 1.3 K/uL — ABNORMAL HIGH (ref 0.1–1.0)
Monocytes Relative: 10 %
Neutro Abs: 9.5 K/uL — ABNORMAL HIGH (ref 1.7–7.7)
Neutrophils Relative %: 74 %
Platelets: 652 K/uL — ABNORMAL HIGH (ref 150–400)
RBC: 3.86 MIL/uL — ABNORMAL LOW (ref 4.22–5.81)
RDW: 16.6 % — ABNORMAL HIGH (ref 11.5–15.5)
WBC: 12.8 K/uL — ABNORMAL HIGH (ref 4.0–10.5)
nRBC: 0 % (ref 0.0–0.2)

## 2022-01-09 LAB — C-REACTIVE PROTEIN: CRP: 2.3 mg/dL — ABNORMAL HIGH

## 2022-01-09 LAB — D-DIMER, QUANTITATIVE: D-Dimer, Quant: 3.43 ug/mL-FEU — ABNORMAL HIGH (ref 0.00–0.50)

## 2022-01-09 NOTE — Progress Notes (Signed)
Subjective/Chief Complaint: Complains of some reflux overnight. Seems better today   Objective: Vital signs in last 24 hours: Temp:  [98.2 F (36.8 C)-98.3 F (36.8 C)] 98.2 F (36.8 C) (01/22 0643) Pulse Rate:  [69-72] 72 (01/22 0643) Resp:  [18] 18 (01/22 0643) BP: (132-146)/(87-92) 132/92 (01/22 0643) SpO2:  [97 %-98 %] 97 % (01/22 0643) Last BM Date: 01/08/22  Intake/Output from previous day: 01/21 0701 - 01/22 0700 In: 2156.6 [P.O.:360; I.V.:1796.6] Out: 700 [Urine:600; Stool:100] Intake/Output this shift: No intake/output data recorded.  General appearance: alert and cooperative Resp: clear to auscultation bilaterally Cardio: regular rate and rhythm GI: soft, nontender. Ostomy with small amount of output  Lab Results:  Recent Labs    01/08/22 0422 01/09/22 0508  WBC 10.1 12.8*  HGB 11.2* 11.5*  HCT 35.6* 36.2*  PLT 642* 652*   BMET Recent Labs    01/08/22 0422 01/09/22 0508  NA 140 136  K 4.4 4.5  CL 107 101  CO2 25 26  GLUCOSE 93 90  BUN 31* 28*  CREATININE 1.61* 1.64*  CALCIUM 8.7* 8.6*   PT/INR No results for input(s): LABPROT, INR in the last 72 hours. ABG No results for input(s): PHART, HCO3 in the last 72 hours.  Invalid input(s): PCO2, PO2  Studies/Results: CT ABDOMEN PELVIS WO CONTRAST  Result Date: 01/07/2022 CLINICAL DATA:  Abdominal pain.  History of colostomy. EXAM: CT ABDOMEN AND PELVIS WITHOUT CONTRAST TECHNIQUE: Multidetector CT imaging of the abdomen and pelvis was performed following the standard protocol without IV contrast. RADIATION DOSE REDUCTION: This exam was performed according to the departmental dose-optimization program which includes automated exposure control, adjustment of the mA and/or kV according to patient size and/or use of iterative reconstruction technique. COMPARISON:  12/09/2021 FINDINGS: Lower chest: Pulmonary nodules again noted in the lung bases. 14 mm nodule right lower lobe on image 5/series 4 is  stable in the interval. Similar appearance small left pleural effusion. Hepatobiliary: Scattered hypoattenuating lesions in the liver parenchyma are similar to prior and incompletely characterized on noncontrast imaging. Gallbladder surgically absent. No intrahepatic or extrahepatic biliary dilation. Pancreas: Status post distal pancreatectomy. Spleen: Status post splenectomy. Adrenals/Urinary Tract: No adrenal nodule or mass. Right renal atrophy again noted with right-sided hydroureteronephrosis. Double-J internal ureteral stent noted left kidney with associated percutaneous nephrostomy tube in the left kidney. Bladder is decompressed. Stomach/Bowel: Stomach is decompressed. Duodenum is normally positioned as is the ligament of Treitz. Distal duodenal stent is similar to prior. Small bowel loops are more dilated on today's study with fluid-filled small bowel dilated up to 4.8 cm diameter in the mid abdomen deep to the umbilicus. Abrupt transition zone is not clearly demonstrated but appears to be in the left pelvis, similar in location to the prior study (axial 64/2). Distal small bowel loops of the pelvis are decompressed. Terminal ileum not well seen. The appendix is not well visualized, but there is no edema or inflammation in the region of the cecum. Left abdominal sigmoid end colostomy noted with Hartmann's pouch anatomy. Vascular/Lymphatic: There is mild atherosclerotic calcification of the abdominal aorta without aneurysm. There is no gastrohepatic or hepatoduodenal ligament lymphadenopathy. No retroperitoneal or mesenteric lymphadenopathy. No pelvic sidewall lymphadenopathy. Reproductive: The prostate gland and seminal vesicles are unremarkable. Other: No intraperitoneal free fluid. Musculoskeletal: No worrisome lytic or sclerotic osseous abnormality. Stable appearance of soft tissue nodules anterior right abdominal wall. IMPRESSION: 1. Similar fluid-filled dilatation of proximal and mid small bowel with  transition zone identified in the left  pelvis. Imaging features suspicious for small bowel obstruction secondary to adhesion. 2. Left abdominal and sigmoid colostomy with Hartmann's pouch anatomy. 3. Left percutaneous nephrostomy tube with double-J left internal ureteral stent in place. 4. Right renal atrophy with right-sided hydroureteronephrosis, stable. 5. Stable appearance of small left pleural effusion. 6. Stable appearance of bilateral pulmonary nodules compatible with metastatic disease. 7. Status post distal pancreatectomy and splenectomy. 8. Aortic Atherosclerosis (ICD10-I70.0). Electronically Signed   By: Misty Hedtke M.D.   On: 01/07/2022 16:29   DG CHEST PORT 1 VIEW  Result Date: 01/07/2022 CLINICAL DATA:  COVID. EXAM: PORTABLE CHEST 1 VIEW COMPARISON:  Chest x-ray 06/11/2021. CT abdomen and pelvis 01/07/2022. CT 11/26/2021. FINDINGS: Right chest port catheter tip projects over the mid SVC. There is a moderate-sized left pleural effusion which may be loculated near the apex. Loculation may be new from prior study. Bilateral pulmonary nodules are again seen which appear similar to prior CT 11/26/2021. No new focal lung infiltrate. No pneumothorax. Cardiomediastinal silhouette appears stable. Heart is enlarged aorta is ectatic. No acute fractures are seen. IMPRESSION: 1. Moderate left pleural effusion is likely partially loculated in the upper hemithorax. Loculation may be new from prior. 2. Grossly unchanged bilateral pulmonary nodules. 3. No new focal lung infiltrate. Electronically Signed   By: Ronney Asters M.D.   On: 01/07/2022 21:56   DG Abd Portable 1V-Small Bowel Obstruction Protocol-24 hr delay  Result Date: 01/08/2022 CLINICAL DATA:  Small-bowel obstruction, 24 hour delay image. EXAM: PORTABLE ABDOMEN - 1 VIEW COMPARISON:  01/08/2022. FINDINGS: Distended loops of small bowel are noted in the left abdomen measuring up to 3.7 cm in diameter. There is associated fold thickening. A  percutaneous nephrostomy tube terminates in the left upper quadrant. Contrast is present in the colon. Suture material is noted in the left lower quadrant. A left ureteral stent is in place. A stent is present in the region of the distal duodenum. No acute osseous abnormality. IMPRESSION: Persistent distended small bowel with fold thickening measuring up to 3.7 cm in diameter, slightly improved from the prior exam. Electronically Signed   By: Brett Fairy M.D.   On: 01/08/2022 23:59   DG Abd Portable 1V-Small Bowel Obstruction Protocol-initial, 8 hr delay  Result Date: 01/08/2022 CLINICAL DATA:  Small-bowel obstruction follow-up EXAM: PORTABLE ABDOMEN - 1 VIEW COMPARISON:  CT abdomen and pelvis 01/07/2022 FINDINGS: Abnormal gas-filled distended loops of small bowel in the mid and upper abdomen measuring up to 4.9 cm in diameter. The colon is normal caliber. No suspicious calcifications identified. Cholecystectomy clips. Left ureteral stent and left percutaneous nephrostomy tube. IMPRESSION: Persistent abnormal distended small bowel suggesting obstruction. Electronically Signed   By: Ofilia Neas M.D.   On: 01/08/2022 10:27    Anti-infectives: Anti-infectives (From admission, onward)    None       Assessment/Plan: s/p * No surgery found * Recommend liquid diet for now.  Patient Active Problem List    Diagnosis Date Noted   SBO (small bowel obstruction) ([place]) 01/07/2022   COVID-19 virus infection 01/07/2022   Colostomy in place [facility]) 11/10/2021   Stage 3a chronic kidney disease (CKD) ([place]) 11/10/2021   Pyelonephritis     Essential hypertension 10/30/2021   Hydronephrosis of left kidney 10/28/2021   Ureteral stricture, left 10/27/2021   Protein-calorie malnutrition, severe 06/12/2021   Hydroureter on right 06/10/2021   Aortic atherosclerosis ([place]) 06/10/2021   Right ureteral stone 06/02/2021   Colon stricture ([place])     Pancreatic mass 06/27/2019  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  This is not consistent with bowel obstruction.  My guess he has an element of intra-abdominal malignancy/carcinomatosis.  His tumor seems to be fairly slow-growing looking at his natural history but he does have stage IV disease.  He does not require any urgent operative intervention for this.  Recommend a low residue diet going forward.  Recommend MiraLAX 2-3 times a day to keep his bowel function within normal limits.  Discuss repercussions of surgery in his case depending on findings and discussed potential complications of morbidity or mortality of being over 12%, discharged to nursing home being 25%, wound infection rates being 48%, exacerbation of renal failure being 8 to 12%.  I explained him his quality life would be very different if we had to operate but this point time he is no acute surgical indications and hopefully can manage him medically.  I recommend long-term planning given his age and the fact he will receive no further chemotherapy/nor does he want/it for his stage IV disease which is a terminal event.  Goals of care should be clearly established with him.  LOS: 2 days    Autumn Messing III 01/09/2022

## 2022-01-09 NOTE — Assessment & Plan Note (Addendum)
RUQ, subcutaneous. Unknown etiology. Biopsy performed on 1/23. Biopsy results pending prior to discharge.

## 2022-01-09 NOTE — Hospital Course (Signed)
Kevin Villegas is a 81 y.o. male with a history of metastatic pancreatic cancer with history of obstructive nephropathy status post left-sided nephrostomy tube placement with history of colectomy and colostomy, bowel obstruction. Patient presented secondary to decreased ostomy output and found to have evidence for a small bowel obstruction. SBO protocol initiated with findings consistent with improvement of obstruction.

## 2022-01-09 NOTE — Plan of Care (Signed)
  Problem: Pain Managment: Goal: General experience of comfort will improve Outcome: Progressing   Problem: Safety: Goal: Ability to remain free from injury will improve Outcome: Progressing   

## 2022-01-09 NOTE — Consult Note (Signed)
Chief Complaint: Concern for malignant intra abdominal obstruction. Request is for right superficial abdominal wall mass biopsy  Referring Physician(s): Dr. Pearletha Alfred  Supervising Physician: Aletta Edouard  Patient Status: [facility] - In-pt  History of Present Illness: Kevin Villegas is a 81 y.o. male History of metastatic pancreatic cancer with obstructive nephropathy and bowel obstruction status post colectomy, colostomy and  left sided nephrostomy tube placed by IR on 11.24.22. Last exchanged on 1.523. Presented to the ED at [facility] on 1.20.23 with no output to his ostomy bag in 3 days and abdominal pain. CT Abd pelvis from 1.20.23. Team is concerned for a possible malignant obstruction. Request is for biopsy of the  right superficial abdominal wall mass for further evaluation of intra abdominal malignancy.   Patient alert and laying in bed, calm and comfortable. Currently without any significant complaints. States overall he is feeling better. Endorses pain at the site of the soft tissue mass when trying to lay on his stomach to sleep. Denies any fevers, headache, chest pain, SOB, cough, abdominal pain, nausea, vomiting or bleeding. Team discussing possible discharge today. Patient  and Tea  made aware the procedure could be performed as outpatient however the Patient requesting to stay inpatient until biopsy is performed. Return precautions and treatment recommendations and follow-up discussed with the patient who is agreeable with the plan.    Past Medical History:  Diagnosis Date   Colostomy present [facility])    Large bowel obstruction ([place])    Pancreatic cancer [facility])     Past Surgical History:  Procedure Laterality Date   ABDOMINAL SURGERY     BIOPSY  05/20/2021   Procedure: BIOPSY;  Surgeon: Otis Brace, MD;  Location: [facility];  Service: Gastroenterology;;   BOWEL RESECTION     BOWEL RESECTION     CHOLECYSTECTOMY N/A 12/16/2014   Procedure: LAPAROSCOPIC CHOLECYSTECTOMY  WITH INTRAOPERATIVE CHOLANGIOGRAM;  Surgeon: Georganna Skeans, MD;  Location: [place];  Service: General;  Laterality: N/A;   COLONIC STENT PLACEMENT N/A 05/26/2021   Procedure: COLONIC STENT PLACEMENT;  Surgeon: Clarene Essex, MD;  Location: [facility];  Service: Endoscopy;  Laterality: N/A;   CYSTOSCOPY W/ URETERAL STENT PLACEMENT Left 10/27/2021   Procedure: CYSTOSCOPY WITH RETROGRADE PYELOGRAM/URETERAL STENT PLACEMENT;  Surgeon: Raynelle Bring, MD;  Location: [facility];  Service: Urology;  Laterality: Left;   ESOPHAGOGASTRODUODENOSCOPY N/A 05/20/2021   Procedure: ESOPHAGOGASTRODUODENOSCOPY (EGD);  Surgeon: Otis Brace, MD;  Location: [facility];  Service: Gastroenterology;  Laterality: N/A;   EUS N/A 09/24/2015   Procedure: UPPER ENDOSCOPIC ULTRASOUND (EUS) LINEAR;  Surgeon: Milus Banister, MD;  Location: [facility];  Service: Endoscopy;  Laterality: N/A;   FLEXIBLE SIGMOIDOSCOPY N/A 05/20/2021   Procedure: FLEXIBLE SIGMOIDOSCOPY;  Surgeon: Otis Brace, MD;  Location: [facility];  Service: Gastroenterology;  Laterality: N/A;   FLEXIBLE SIGMOIDOSCOPY N/A 05/26/2021   Procedure: FLEXIBLE SIGMOIDOSCOPY;  Surgeon: Clarene Essex, MD;  Location: [facility];  Service: Endoscopy;  Laterality: N/A;   IR NEPHROSTOMY EXCHANGE LEFT  12/23/2021   IR NEPHROSTOMY PLACEMENT LEFT  11/11/2021   IR NEPHROSTOMY PLACEMENT RIGHT  11/11/2021   LAPAROSCOPIC DISTAL PANCREATECTOMY  10/2017   [facility]   LAPAROSCOPIC PARTIAL GASTRECTOMY  10/2017   [facility]   LAPAROSCOPIC SIGMOID COLECTOMY N/A 06/14/2021   Procedure: LAPAROSCOPY WITH MOBILIZATION, PROXIMAL DIVERTING COLOSTOMY, CREATION OF DISTAL MUCOUS FISTULA;  Surgeon: Johnathan Hausen, MD;  Location: [facility];  Service: General;  Laterality: N/A;   LAPAROSCOPIC SPLENECTOMY  10/2017   [facility]   VASECTOMY  Allergies: Pollen extract  Medications: Prior to Admission medications   Medication Sig Start Date End Date Taking? Authorizing Provider  acetaminophen  (TYLENOL) 325 MG tablet Take 650 mg by mouth every 6 (six) hours as needed for mild pain, fever or headache.    [provider]  mirtazapine (REMERON) 15 MG tablet Take 1 tablet (15 mg total) by mouth at bedtime. 12/31/21   Dettinger, [name], MD     Family History  Problem Relation Age of Onset   Stroke Mother     Social History   Socioeconomic History   Marital status: Married    Spouse name: Not on file   Number of children: 2   Years of education: Not on file   Highest education level: Not on file  Occupational History   Occupation: retired  Tobacco Use   Smoking status: Never   Smokeless tobacco: Never  Vaping Use   Vaping Use: Never used  Substance and Sexual Activity   Alcohol use: No    Alcohol/week: 0.0 standard drinks   Drug use: No   Sexual activity: Yes  Other Topics Concern   Not on file  Social History Narrative   Not on file   Social Determinants of Health   Financial Resource Strain: Not on file  Food Insecurity: Not on file  Transportation Needs: Not on file  Physical Activity: Not on file  Stress: Not on file  Social Connections: Not on file     Review of Systems: A 12 point ROS discussed and pertinent positives are indicated in the HPI above.  All other systems are negative.  Review of Systems  Constitutional:  Negative for fever.  HENT:  Negative for congestion.   Respiratory:  Negative for cough and shortness of breath.   Cardiovascular:  Negative for chest pain.  Gastrointestinal:  Negative for abdominal pain.       Right sided soft tissue mass. Painful when laying down on his stomach  Neurological:  Negative for headaches.  Psychiatric/Behavioral:  Negative for behavioral problems and confusion.    Vital Signs: BP (!) 132/92 (BP Location: Left Arm)    Pulse 72    Temp 98.2 F (36.8 C) (Oral)    Resp 18    Ht 5\' 11"  (1.803 m)    Wt 140 lb (63.5 kg)    SpO2 97%    BMI 19.53 kg/m   Physical Exam Vitals and nursing note  reviewed.  Constitutional:      Appearance: He is well-developed.  HENT:     Head: Normocephalic.  Cardiovascular:     Rate and Rhythm: Normal rate and regular rhythm.     Heart sounds: Normal heart sounds.  Pulmonary:     Effort: Pulmonary effort is normal.  Abdominal:     Comments: RUQ soft tissue mass approximately 5 cm in diameter.   Genitourinary:    Comments: Positive left nephrostomy tube to gravity bag. Site is unremarkable with no erythema, edema, tenderness, bleeding or drainage noted at exit site. Suture in place. Dressing is clean dry and intact. 150  ml of  straw colored fluid noted in the gravity bag.  Musculoskeletal:        General: Normal range of motion.     Cervical back: Normal range of motion.  Skin:    General: Skin is dry.  Neurological:     Mental Status: He is alert and oriented to person, place, and time.    Imaging: CT ABDOMEN PELVIS WO  CONTRAST  Result Date: 01/07/2022 CLINICAL DATA:  Abdominal pain.  History of colostomy. EXAM: CT ABDOMEN AND PELVIS WITHOUT CONTRAST TECHNIQUE: Multidetector CT imaging of the abdomen and pelvis was performed following the standard protocol without IV contrast. RADIATION DOSE REDUCTION: This exam was performed according to the departmental dose-optimization program which includes automated exposure control, adjustment of the mA and/or kV according to patient size and/or use of iterative reconstruction technique. COMPARISON:  12/09/2021 FINDINGS: Lower chest: Pulmonary nodules again noted in the lung bases. 14 mm nodule right lower lobe on image 5/series 4 is stable in the interval. Similar appearance small left pleural effusion. Hepatobiliary: Scattered hypoattenuating lesions in the liver parenchyma are similar to prior and incompletely characterized on noncontrast imaging. Gallbladder surgically absent. No intrahepatic or extrahepatic biliary dilation. Pancreas: Status post distal pancreatectomy. Spleen: Status post splenectomy.  Adrenals/Urinary Tract: No adrenal nodule or mass. Right renal atrophy again noted with right-sided hydroureteronephrosis. Double-J internal ureteral stent noted left kidney with associated percutaneous nephrostomy tube in the left kidney. Bladder is decompressed. Stomach/Bowel: Stomach is decompressed. Duodenum is normally positioned as is the ligament of Treitz. Distal duodenal stent is similar to prior. Small bowel loops are more dilated on today's study with fluid-filled small bowel dilated up to 4.8 cm diameter in the mid abdomen deep to the umbilicus. Abrupt transition zone is not clearly demonstrated but appears to be in the left pelvis, similar in location to the prior study (axial 64/2). Distal small bowel loops of the pelvis are decompressed. Terminal ileum not well seen. The appendix is not well visualized, but there is no edema or inflammation in the region of the cecum. Left abdominal sigmoid end colostomy noted with Hartmann's pouch anatomy. Vascular/Lymphatic: There is mild atherosclerotic calcification of the abdominal aorta without aneurysm. There is no gastrohepatic or hepatoduodenal ligament lymphadenopathy. No retroperitoneal or mesenteric lymphadenopathy. No pelvic sidewall lymphadenopathy. Reproductive: The prostate gland and seminal vesicles are unremarkable. Other: No intraperitoneal free fluid. Musculoskeletal: No worrisome lytic or sclerotic osseous abnormality. Stable appearance of soft tissue nodules anterior right abdominal wall. IMPRESSION: 1. Similar fluid-filled dilatation of proximal and mid small bowel with transition zone identified in the left pelvis. Imaging features suspicious for small bowel obstruction secondary to adhesion. 2. Left abdominal and sigmoid colostomy with Hartmann's pouch anatomy. 3. Left percutaneous nephrostomy tube with double-J left internal ureteral stent in place. 4. Right renal atrophy with right-sided hydroureteronephrosis, stable. 5. Stable appearance of  small left pleural effusion. 6. Stable appearance of bilateral pulmonary nodules compatible with metastatic disease. 7. Status post distal pancreatectomy and splenectomy. 8. Aortic Atherosclerosis (ICD10-I70.0). Electronically Signed   By: Misty Ulrich M.D.   On: 01/07/2022 16:29   DG CHEST PORT 1 VIEW  Result Date: 01/07/2022 CLINICAL DATA:  COVID. EXAM: PORTABLE CHEST 1 VIEW COMPARISON:  Chest x-ray 06/11/2021. CT abdomen and pelvis 01/07/2022. CT 11/26/2021. FINDINGS: Right chest port catheter tip projects over the mid SVC. There is a moderate-sized left pleural effusion which may be loculated near the apex. Loculation may be new from prior study. Bilateral pulmonary nodules are again seen which appear similar to prior CT 11/26/2021. No new focal lung infiltrate. No pneumothorax. Cardiomediastinal silhouette appears stable. Heart is enlarged aorta is ectatic. No acute fractures are seen. IMPRESSION: 1. Moderate left pleural effusion is likely partially loculated in the upper hemithorax. Loculation may be new from prior. 2. Grossly unchanged bilateral pulmonary nodules. 3. No new focal lung infiltrate. Electronically Signed   By: Tina Griffiths.D.  On: 01/07/2022 21:56   DG Abd Portable 1V-Small Bowel Obstruction Protocol-24 hr delay  Result Date: 01/08/2022 CLINICAL DATA:  Small-bowel obstruction, 24 hour delay image. EXAM: PORTABLE ABDOMEN - 1 VIEW COMPARISON:  01/08/2022. FINDINGS: Distended loops of small bowel are noted in the left abdomen measuring up to 3.7 cm in diameter. There is associated fold thickening. A percutaneous nephrostomy tube terminates in the left upper quadrant. Contrast is present in the colon. Suture material is noted in the left lower quadrant. A left ureteral stent is in place. A stent is present in the region of the distal duodenum. No acute osseous abnormality. IMPRESSION: Persistent distended small bowel with fold thickening measuring up to 3.7 cm in diameter, slightly  improved from the prior exam. Electronically Signed   By: Brett Fairy M.D.   On: 01/08/2022 23:59   DG Abd Portable 1V-Small Bowel Obstruction Protocol-initial, 8 hr delay  Result Date: 01/08/2022 CLINICAL DATA:  Small-bowel obstruction follow-up EXAM: PORTABLE ABDOMEN - 1 VIEW COMPARISON:  CT abdomen and pelvis 01/07/2022 FINDINGS: Abnormal gas-filled distended loops of small bowel in the mid and upper abdomen measuring up to 4.9 cm in diameter. The colon is normal caliber. No suspicious calcifications identified. Cholecystectomy clips. Left ureteral stent and left percutaneous nephrostomy tube. IMPRESSION: Persistent abnormal distended small bowel suggesting obstruction. Electronically Signed   By: Ofilia Neas M.D.   On: 01/08/2022 10:27   DG Abd Portable 1V-Small Bowel Obstruction Protocol-24 hr delay  Result Date: 12/11/2021 CLINICAL DATA:  Small bowel obstruction EXAM: PORTABLE ABDOMEN - 1 VIEW COMPARISON:  12/10/2021 FINDINGS: Left nephrostomy catheter and ureteral stent again noted, unchanged. Small bowel stent in the left abdomen. Continued mild gaseous distention of small bowel in the right abdomen, but improved since prior study. Oral contrast material seen within the colon and the ostomy bag. IMPRESSION: Mild right abdominal small bowel distension, overall improved since prior study. Contrast seen within the colon and ostomy bag. Electronically Signed   By: Rolm Baptise M.D.   On: 12/11/2021 06:34   DG Abd Portable 1V-Small Bowel Obstruction Protocol-initial, 8 hr delay  Result Date: 12/10/2021 CLINICAL DATA:  8 hour delay bowel obstruction EXAM: PORTABLE ABDOMEN - 1 VIEW COMPARISON:  10/13/2021, CT 12/09/2021 FINDINGS: Small left-sided pleural effusion and basilar airspace disease. Stent in the left upper quadrant. Persistent dilatation of central small bowel up to 5.1 cm. Enteral contrast within the colon and probably the left lower quadrant colostomy. Left-sided ureteral stent and  nephrostomy tubes. IMPRESSION: Persistent central small bowel distension but with enteral contrast noted in the colon. Small left effusion with left basilar atelectasis or pneumonia Electronically Signed   By: Donavan Foil M.D.   On: 12/10/2021 20:41   IR NEPHROSTOMY EXCHANGE LEFT  Result Date: 12/23/2021 INDICATION: 81 year old male with a history of metastatic pancreatic cancer with left ureteral obstruction. EXAM: Nephrostomy tube exchange COMPARISON:  None. MEDICATIONS: None ANESTHESIA/SEDATION: None CONTRAST:  5 mL Omnipaque 300-administered into the collecting system(s) FLUOROSCOPY TIME:  Fluoroscopy Time: 2 minutes 54 seconds (11 mGy). COMPLICATIONS: None immediate. PROCEDURE: Informed written consent was obtained from the patient after a thorough discussion of the procedural risks, benefits and alternatives. All questions were addressed. Maximal Sterile Barrier Technique was utilized including caps, mask, sterile gowns, sterile gloves, sterile drape, hand hygiene and skin antiseptic. A timeout was performed prior to the initiation of the procedure. An initial hand injection of contrast material was performed. The existing catheter has pulled back into the lower pole calyx. The catheter  was transected and carefully removed over a hydrophilic road runner wire. The wire was navigated back into the renal pelvis. A new Cook 10 [place] all-purpose drainage catheter was then advanced over the wire and positioned in the renal pelvis. The locking pigtail loop was formed. The catheter was secured to the skin with 0 Prolene suture and an adhesive fixation device. Contrast was injected through the new tube confirming its location in the renal pelvis. Images were obtained and stored for the medical record. IMPRESSION: Successful exchange of left-sided 10 French percutaneous nephrostomy tube. Electronically Signed   By: Jacqulynn Cadet M.D.   On: 12/23/2021 17:04    Labs:  CBC: Recent Labs    12/31/21 1127  01/07/22 1607 01/08/22 0422 01/09/22 0508  WBC 11.0* 11.7* 10.1 12.8*  HGB 12.6* 13.1 11.2* 11.5*  HCT 39.1 40.2 35.6* 36.2*  PLT 539* 618* 642* 652*    COAGS: Recent Labs    06/02/21 0407 06/11/21 1907 11/11/21 1215  INR 1.1 1.3* 1.2  APTT  --  26  --     BMP: Recent Labs    12/11/21 0519 12/31/21 1127 01/07/22 1607 01/08/22 0422 01/09/22 0508  NA 139 139 136 140 136  K 4.4 5.6* 4.9 4.4 4.5  CL 107 102 103 107 101  CO2 22 24 22 25 26   GLUCOSE 88 100* 109* 93 90  BUN 33* 38* 31* 31* 28*  CALCIUM 8.4* 9.4 9.2 8.7* 8.6*  CREATININE 1.81* 1.73* 1.72* 1.61* 1.64*  GFRNONAA 38*  --  40* 43* 42*    LIVER FUNCTION TESTS: Recent Labs    12/31/21 1127 01/07/22 1607 01/08/22 0422 01/09/22 0508  BILITOT 0.3 0.5 0.7 0.6  AST 45* 47* 33 24  ALT 40 35 29 24  ALKPHOS 83 70 62 63  PROT 7.2 7.9 6.6 6.6  ALBUMIN 3.9 3.3* 3.0* 2.8*     Assessment and Plan:  81 y.o. male inpatient. History of metastatic pancreatic cancer with obstructive nephropathy and bowel obstruction status post colectomy, colostomy and  left sided nephrostomy tube placed by IR on 11.24.22. Last exchanged on 1.5.23. Presented to the ED at [facility] on 1.20.23 with no output to his ostomy bag in 3 days and abdominal pain. CT Abd pelvis from 1.20.23. Team is concerned for a possible malignant obstruction. Request is for biopsy of the  right superficial abdominal wall mass for further evaluation of intra abdominal malignancy.   COVID + ( from previous infection per Team) WBC 12.8, BUN 28, Cr 1.64, GFR<42, D-Dimer 3.43. Patient is on subcutaneous prophylactic dose of heparin. All other labs and medications are within acceptable parameters. No pertinent allergies.  IR consulted for possible right superficial abdominal wall mass biopsy. Case has been reviewed and procedure approved by Dr. Kathlene Cote.  Patient tentatively scheduled for 1.23.23 per Patient's request while inpatient. .  Team instructed to: Keep Patient  to be NPO after midnight Hold prophylactic anticoagulation 24 hours prior to scheduled procedure. IR will call patient when ready.   Should patient decide to be discharge prior to procedure. Procedure can be scheduled as Outpatient.   Risks and benefits of right abdominal mall soft tissue mass was discussed with the patient and/or patient's family including, but not limited to bleeding, infection, damage to adjacent structures or low yield requiring additional tests.  All of the questions were answered and there is agreement to proceed.  Consent signed and in chart.   Thank you for this interesting consult.  I greatly enjoyed  meeting Emilio Math and look forward to participating in their care.  A copy of this report was sent to the requesting provider on this date.  Electronically Signed: Jacqualine Mau, NP 01/09/2022, 8:36 AM   I spent a total of 40 Minutes    in face to face in clinical consultation, greater than 50% of which was counseling/coordinating care for superficial intra abdominal soft tissue mass.

## 2022-01-09 NOTE — Progress Notes (Signed)
PROGRESS NOTE    Kevin Villegas  LOV:[id number] DOB: 1941/11/21 DOA: 01/07/2022 PCP: Dettinger, [name], MD   Brief Narrative: Kevin Villegas is a 81 y.o. male with a history of metastatic pancreatic cancer with history of obstructive nephropathy status post left-sided nephrostomy tube placement with history of colectomy and colostomy, bowel obstruction. Patient presented secondary to decreased ostomy output and found to have evidence for a small bowel obstruction. SBO protocol initiated with findings consistent with improvement of obstruction.   Assessment & Plan:   * SBO (small bowel obstruction) ([place])- (present on admission) General surgery consulted on admission. CT abdomen/pelvis suggests left pelvis region obstruction with transition point. Small bowel protocol initiated with contrast seen in the colon. General surgery recommending non-surgical/conservative management at this time. Diet advanced to a soft diet.  Colostomy in place [facility]) Noted.  Hydronephrosis of left kidney- (present on admission) Present on admission. History of nephrostomy tubes and ureteral stent placement. Stable.  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 Follows with Dr. Marin Olp  Abdominal mass RUQ, subcutaneous. Unknown etiology. Biopsy ordered and is pending. Plan for biopsy on 1/23  COVID-19 virus infection- (present on admission) Previous infection, still positive. Asymptomatic and not on treatment.  Stage 3a chronic kidney disease (CKD) ([place])- (present on admission) Stable.  Hydroureter on right- (present on admission) Stable.   DVT prophylaxis: Heparin subcu Code Status:   Code Status: Full Code Family Communication: None at bedside Disposition Plan: Discharge home in 24 hours pending general surgery recommendations an dbiopsy   Consultants:  General surgery  Procedures:  None  Antimicrobials: None     Subjective: Some reflux and nausea overnight that has resolved this morning was able to eat breakfast this morning.  Objective: Vitals:   01/08/22 0151 01/08/22 0630 01/08/22 1340 01/09/22 0643  BP: (!) 158/89 (!) 141/92 (!) 146/87 (!) 132/92  Pulse: 72 72 69 72  Resp: 16 18 18 18   Temp: 98.5 F (36.9 C) 97.6 F (36.4 C) 98.3 F (36.8 C) 98.2 F (36.8 C)  TempSrc: Oral Oral Oral Oral  SpO2: 97% 97% 98% 97%  Weight:      Height:        Intake/Output Summary (Last 24 hours) at 01/09/2022 1000 Last data filed at 01/09/2022 0950 Gross per 24 hour  Intake 2396.55 ml  Output 700 ml  Net 1696.55 ml    Filed Weights   01/07/22 1541  Weight: 63.5 kg    Examination:  General exam: Appears calm and comfortable Respiratory system: Clear to auscultation. Respiratory effort normal. Cardiovascular system: S1 & S2 heard, RRR. No murmurs, rubs, gallops or clicks. Gastrointestinal system: Abdomen is slightly distended, soft and nontender. Normal bowel sounds heard. Central nervous system: Alert and oriented. No focal neurological deficits. Musculoskeletal: No edema. No calf tenderness Skin: No cyanosis. No rashes Psychiatry: Judgement and insight appear normal. Mood & affect appropriate.    Data Reviewed: I have personally reviewed following labs and imaging studies  CBC Lab Results  Component Value Date   WBC 12.8 (H) 01/09/2022   RBC 3.86 (L) 01/09/2022   HGB 11.5 (L) 01/09/2022   HCT 36.2 (L) 01/09/2022   MCV 93.8 01/09/2022   MCH 29.8 01/09/2022   PLT 652 (H) 01/09/2022   MCHC 31.8 01/09/2022   RDW 16.6 (H) 01/09/2022   LYMPHSABS 1.5 01/09/2022   MONOABS 1.3 (H) 01/09/2022   EOSABS 0.3 01/09/2022   BASOSABS 0.1 01/09/2022  Last metabolic panel Lab Results  Component Value Date   NA 136 01/09/2022   K 4.5 01/09/2022   CL 101 01/09/2022   CO2 26 01/09/2022   BUN 28 (H) 01/09/2022   CREATININE 1.64 (H) 01/09/2022   GLUCOSE 90 01/09/2022   GFRNONAA 42  (L) 01/09/2022   GFRAA >60 11/24/2019   CALCIUM 8.6 (L) 01/09/2022   PHOS 3.1 06/16/2021   PROT 6.6 01/09/2022   ALBUMIN 2.8 (L) 01/09/2022   LABGLOB 3.3 12/31/2021   AGRATIO 1.2 12/31/2021   BILITOT 0.6 01/09/2022   ALKPHOS 63 01/09/2022   AST 24 01/09/2022   ALT 24 01/09/2022   ANIONGAP 9 01/09/2022    CBG (last 3)  No results for input(s): GLUCAP in the last 72 hours.   GFR: Estimated Creatinine Clearance: 32.3 mL/min (A) (by C-G formula based on SCr of 1.64 mg/dL (H)).  Coagulation Profile: No results for input(s): INR, PROTIME in the last 168 hours.  Recent Results (from the past 240 hour(s))  Resp Panel by RT-PCR (Flu A&B, Covid) Nasopharyngeal Swab     Status: Abnormal   Collection Time: 01/07/22  5:55 PM   Specimen: Nasopharyngeal Swab; Nasopharyngeal(NP) swabs in vial transport medium  Result Value Ref Range Status   SARS Coronavirus 2 by RT PCR POSITIVE (A) NEGATIVE Final    Comment: (NOTE) SARS-CoV-2 target nucleic acids are DETECTED.  The SARS-CoV-2 RNA is generally detectable in upper respiratory specimens during the acute phase of infection. Positive results are indicative of the presence of the identified virus, but do not rule out bacterial infection or co-infection with other pathogens not detected by the test. Clinical correlation with patient history and other diagnostic information is necessary to determine patient infection status. The expected result is Negative.  Fact Sheet for Patients: EntrepreneurPulse.com.au  Fact Sheet for Healthcare Providers: IncredibleEmployment.be  This test is not yet approved or cleared by the [place] FDA and  has been authorized for detection and/or diagnosis of SARS-CoV-2 by FDA under an Emergency Use Authorization (EUA).  This EUA will remain in effect (meaning this test can be used) for the duration of  the COVID-19 declaration under Section 564(b)(1) of the A ct,  21 U.S.C. section 360bbb-3(b)(1), unless the authorization is terminated or revoked sooner.     Influenza A by PCR NEGATIVE NEGATIVE Final   Influenza B by PCR NEGATIVE NEGATIVE Final    Comment: (NOTE) The Xpert Xpress SARS-CoV-2/FLU/RSV plus assay is intended as an aid in the diagnosis of influenza from Nasopharyngeal swab specimens and should not be used as a sole basis for treatment. Nasal washings and aspirates are unacceptable for Xpert Xpress SARS-CoV-2/FLU/RSV testing.  Fact Sheet for Patients: EntrepreneurPulse.com.au  Fact Sheet for Healthcare Providers: IncredibleEmployment.be  This test is not yet approved or cleared by the [place] FDA and has been authorized for detection and/or diagnosis of SARS-CoV-2 by FDA under an Emergency Use Authorization (EUA). This EUA will remain in effect (meaning this test can be used) for the duration of the COVID-19 declaration under Section 564(b)(1) of the Act, 21 U.S.C. section 360bbb-3(b)(1), unless the authorization is terminated or revoked.  Performed at [facility], [address], [place], [place] [address]         Radiology Studies: CT ABDOMEN PELVIS WO CONTRAST  Result Date: 01/07/2022 CLINICAL DATA:  Abdominal pain.  History of colostomy. EXAM: CT ABDOMEN AND PELVIS WITHOUT CONTRAST TECHNIQUE: Multidetector CT imaging of the abdomen and pelvis was performed following  the standard protocol without IV contrast. RADIATION DOSE REDUCTION: This exam was performed according to the departmental dose-optimization program which includes automated exposure control, adjustment of the mA and/or kV according to patient size and/or use of iterative reconstruction technique. COMPARISON:  12/09/2021 FINDINGS: Lower chest: Pulmonary nodules again noted in the lung bases. 14 mm nodule right lower lobe on image 5/series 4 is stable in the interval. Similar appearance small left pleural  effusion. Hepatobiliary: Scattered hypoattenuating lesions in the liver parenchyma are similar to prior and incompletely characterized on noncontrast imaging. Gallbladder surgically absent. No intrahepatic or extrahepatic biliary dilation. Pancreas: Status post distal pancreatectomy. Spleen: Status post splenectomy. Adrenals/Urinary Tract: No adrenal nodule or mass. Right renal atrophy again noted with right-sided hydroureteronephrosis. Double-J internal ureteral stent noted left kidney with associated percutaneous nephrostomy tube in the left kidney. Bladder is decompressed. Stomach/Bowel: Stomach is decompressed. Duodenum is normally positioned as is the ligament of Treitz. Distal duodenal stent is similar to prior. Small bowel loops are more dilated on today's study with fluid-filled small bowel dilated up to 4.8 cm diameter in the mid abdomen deep to the umbilicus. Abrupt transition zone is not clearly demonstrated but appears to be in the left pelvis, similar in location to the prior study (axial 64/2). Distal small bowel loops of the pelvis are decompressed. Terminal ileum not well seen. The appendix is not well visualized, but there is no edema or inflammation in the region of the cecum. Left abdominal sigmoid end colostomy noted with Hartmann's pouch anatomy. Vascular/Lymphatic: There is mild atherosclerotic calcification of the abdominal aorta without aneurysm. There is no gastrohepatic or hepatoduodenal ligament lymphadenopathy. No retroperitoneal or mesenteric lymphadenopathy. No pelvic sidewall lymphadenopathy. Reproductive: The prostate gland and seminal vesicles are unremarkable. Other: No intraperitoneal free fluid. Musculoskeletal: No worrisome lytic or sclerotic osseous abnormality. Stable appearance of soft tissue nodules anterior right abdominal wall. IMPRESSION: 1. Similar fluid-filled dilatation of proximal and mid small bowel with transition zone identified in the left pelvis. Imaging features  suspicious for small bowel obstruction secondary to adhesion. 2. Left abdominal and sigmoid colostomy with Hartmann's pouch anatomy. 3. Left percutaneous nephrostomy tube with double-J left internal ureteral stent in place. 4. Right renal atrophy with right-sided hydroureteronephrosis, stable. 5. Stable appearance of small left pleural effusion. 6. Stable appearance of bilateral pulmonary nodules compatible with metastatic disease. 7. Status post distal pancreatectomy and splenectomy. 8. Aortic Atherosclerosis (ICD10-I70.0). Electronically Signed   By: Misty Abate M.D.   On: 01/07/2022 16:29   DG CHEST PORT 1 VIEW  Result Date: 01/07/2022 CLINICAL DATA:  COVID. EXAM: PORTABLE CHEST 1 VIEW COMPARISON:  Chest x-ray 06/11/2021. CT abdomen and pelvis 01/07/2022. CT 11/26/2021. FINDINGS: Right chest port catheter tip projects over the mid SVC. There is a moderate-sized left pleural effusion which may be loculated near the apex. Loculation may be new from prior study. Bilateral pulmonary nodules are again seen which appear similar to prior CT 11/26/2021. No new focal lung infiltrate. No pneumothorax. Cardiomediastinal silhouette appears stable. Heart is enlarged aorta is ectatic. No acute fractures are seen. IMPRESSION: 1. Moderate left pleural effusion is likely partially loculated in the upper hemithorax. Loculation may be new from prior. 2. Grossly unchanged bilateral pulmonary nodules. 3. No new focal lung infiltrate. Electronically Signed   By: Ronney Asters M.D.   On: 01/07/2022 21:56   DG Abd Portable 1V-Small Bowel Obstruction Protocol-24 hr delay  Result Date: 01/08/2022 CLINICAL DATA:  Small-bowel obstruction, 24 hour delay image. EXAM: PORTABLE ABDOMEN -  1 VIEW COMPARISON:  01/08/2022. FINDINGS: Distended loops of small bowel are noted in the left abdomen measuring up to 3.7 cm in diameter. There is associated fold thickening. A percutaneous nephrostomy tube terminates in the left upper quadrant.  Contrast is present in the colon. Suture material is noted in the left lower quadrant. A left ureteral stent is in place. A stent is present in the region of the distal duodenum. No acute osseous abnormality. IMPRESSION: Persistent distended small bowel with fold thickening measuring up to 3.7 cm in diameter, slightly improved from the prior exam. Electronically Signed   By: Brett Fairy M.D.   On: 01/08/2022 23:59   DG Abd Portable 1V-Small Bowel Obstruction Protocol-initial, 8 hr delay  Result Date: 01/08/2022 CLINICAL DATA:  Small-bowel obstruction follow-up EXAM: PORTABLE ABDOMEN - 1 VIEW COMPARISON:  CT abdomen and pelvis 01/07/2022 FINDINGS: Abnormal gas-filled distended loops of small bowel in the mid and upper abdomen measuring up to 4.9 cm in diameter. The colon is normal caliber. No suspicious calcifications identified. Cholecystectomy clips. Left ureteral stent and left percutaneous nephrostomy tube. IMPRESSION: Persistent abnormal distended small bowel suggesting obstruction. Electronically Signed   By: Ofilia Neas M.D.   On: 01/08/2022 10:27        Scheduled Meds:  Chlorhexidine Gluconate Cloth  6 each Topical Daily   heparin  5,000 Units Subcutaneous Q8H   polyethylene glycol  17 g Oral BID   sodium chloride flush  10-40 mL Intracatheter Q12H   Continuous Infusions:     LOS: 2 days     Cordelia Poche, MD [facility] 01/09/2022, 10:00 AM  If 7PM-7AM, please contact night-coverage [email or web address]

## 2022-01-10 ENCOUNTER — Other Ambulatory Visit (HOSPITAL_COMMUNITY): Payer: Self-pay | Admitting: Physician Assistant

## 2022-01-10 ENCOUNTER — Inpatient Hospital Stay: Payer: Medicare Other

## 2022-01-10 ENCOUNTER — Ambulatory Visit: Payer: Medicare Other | Admitting: Hematology & Oncology

## 2022-01-10 ENCOUNTER — Inpatient Hospital Stay (HOSPITAL_COMMUNITY): Payer: Medicare Other

## 2022-01-10 DIAGNOSIS — K56600 Partial intestinal obstruction, unspecified as to cause: Secondary | ICD-10-CM | POA: Diagnosis not present

## 2022-01-10 LAB — CBC WITH DIFFERENTIAL/PLATELET
Abs Immature Granulocytes: 0.04 10*3/uL (ref 0.00–0.07)
Basophils Absolute: 0.1 10*3/uL (ref 0.0–0.1)
Basophils Relative: 0 %
Eosinophils Absolute: 0.4 10*3/uL (ref 0.0–0.5)
Eosinophils Relative: 3 %
HCT: 37.1 % — ABNORMAL LOW (ref 39.0–52.0)
Hemoglobin: 11.6 g/dL — ABNORMAL LOW (ref 13.0–17.0)
Immature Granulocytes: 0 %
Lymphocytes Relative: 13 %
Lymphs Abs: 1.7 10*3/uL (ref 0.7–4.0)
MCH: 29.4 pg (ref 26.0–34.0)
MCHC: 31.3 g/dL (ref 30.0–36.0)
MCV: 93.9 fL (ref 80.0–100.0)
Monocytes Absolute: 1.3 10*3/uL — ABNORMAL HIGH (ref 0.1–1.0)
Monocytes Relative: 10 %
Neutro Abs: 10 10*3/uL — ABNORMAL HIGH (ref 1.7–7.7)
Neutrophils Relative %: 74 %
Platelets: 653 10*3/uL — ABNORMAL HIGH (ref 150–400)
RBC: 3.95 MIL/uL — ABNORMAL LOW (ref 4.22–5.81)
RDW: 16.6 % — ABNORMAL HIGH (ref 11.5–15.5)
WBC: 13.5 10*3/uL — ABNORMAL HIGH (ref 4.0–10.5)
nRBC: 0 % (ref 0.0–0.2)

## 2022-01-10 LAB — COMPREHENSIVE METABOLIC PANEL
ALT: 21 U/L (ref 0–44)
AST: 23 U/L (ref 15–41)
Albumin: 2.9 g/dL — ABNORMAL LOW (ref 3.5–5.0)
Alkaline Phosphatase: 63 U/L (ref 38–126)
Anion gap: 7 (ref 5–15)
BUN: 30 mg/dL — ABNORMAL HIGH (ref 8–23)
CO2: 28 mmol/L (ref 22–32)
Calcium: 8.6 mg/dL — ABNORMAL LOW (ref 8.9–10.3)
Chloride: 102 mmol/L (ref 98–111)
Creatinine, Ser: 1.72 mg/dL — ABNORMAL HIGH (ref 0.61–1.24)
GFR, Estimated: 40 mL/min — ABNORMAL LOW (ref >60)
Glucose, Bld: 88 mg/dL (ref 70–99)
Potassium: 4.5 mmol/L (ref 3.5–5.1)
Sodium: 137 mmol/L (ref 135–145)
Total Bilirubin: 0.5 mg/dL (ref 0.3–1.2)
Total Protein: 6.7 g/dL (ref 6.5–8.1)

## 2022-01-10 LAB — C-REACTIVE PROTEIN: CRP: 2.7 mg/dL — ABNORMAL HIGH (ref <1.0)

## 2022-01-10 LAB — D-DIMER, QUANTITATIVE: D-Dimer, Quant: 4.05 ug/mL-FEU — ABNORMAL HIGH (ref 0.00–0.50)

## 2022-01-10 LAB — CANCER ANTIGEN 19-9: CA 19-9: 14 U/mL (ref 0–35)

## 2022-01-10 LAB — CEA: CEA: 122 ng/mL — ABNORMAL HIGH (ref 0.0–4.7)

## 2022-01-10 MED ORDER — FENTANYL CITRATE (PF) 100 MCG/2ML IJ SOLN
INTRAMUSCULAR | Status: AC
  Filled 2022-01-10: qty 2

## 2022-01-10 MED ORDER — FENTANYL CITRATE (PF) 100 MCG/2ML IJ SOLN
INTRAMUSCULAR | Status: AC | PRN
Start: 2022-01-10 — End: 2022-01-10
  Administered 2022-01-10 (×2): 50 ug via INTRAVENOUS

## 2022-01-10 MED ORDER — LIDOCAINE HCL 1 % IJ SOLN
INTRAMUSCULAR | Status: AC
  Administered 2022-01-10: 10 mL
  Filled 2022-01-10: qty 20

## 2022-01-10 MED ORDER — POLYETHYLENE GLYCOL 3350 17 G PO PACK: 17.0000 g | PACK | Freq: Two times a day (BID) | ORAL | 0 refills | Status: DC

## 2022-01-10 MED ORDER — HEPARIN SOD (PORK) LOCK FLUSH 100 UNIT/ML IV SOLN
500.0000 [IU] | INTRAVENOUS | Status: AC | PRN
  Administered 2022-01-10: 500 [IU]
  Filled 2022-01-10: qty 5

## 2022-01-10 MED ORDER — MIDAZOLAM HCL 2 MG/2ML IJ SOLN
INTRAMUSCULAR | Status: AC
  Filled 2022-01-10: qty 2

## 2022-01-10 MED ORDER — MIDAZOLAM HCL 2 MG/2ML IJ SOLN
INTRAMUSCULAR | Status: AC | PRN
  Administered 2022-01-10 (×2): 1 mg via INTRAVENOUS

## 2022-01-10 NOTE — Progress Notes (Signed)
NUTRITION NOTE  Consult received for low residue diet education. Patient was admitted for SBO and is currently on a Soft diet.  Able to communicate with RN via secure chat. RD to place handouts in Discharge Instructions/AVS due to time constraint leading to inability to see patient in person today and patient to discharge today.     Jarome Matin, MS, RD, LDN Inpatient Clinical Dietitian RD pager # available in [place]  After hours/weekend pager # available in [facility]

## 2022-01-10 NOTE — Discharge Summary (Signed)
Physician Discharge Summary  Kevin Villegas UXN:[id number] DOB: 11-01-1941 DOA: 01/07/2022  PCP: Worthy Rancher, MD  Admit date: 01/07/2022 Discharge date: 01/10/2022  Admitted From: Home Disposition: Home  Recommendations for Outpatient Follow-up:  Follow up with PCP in 1 week Follow up with medical oncology Please follow up on the following pending results: Subcutaneous nodule biopsy  Home Health: None Equipment/Devices: None  Discharge Condition: Stable CODE STATUS: Full code Diet recommendation: Low fiber   Brief/Interim Summary:  Admission HPI written by Rise Patience, MD   HPI: Kevin Villegas is a 81 y.o. male with history of metastatic pancreatic cancer with history of obstructive nephropathy status post left-sided nephrostomy tube placement with history of colectomy and colostomy admitted last month for bowel obstruction presents to the ER because of decreased colostomy output for the last 3 days but denies any vomiting or abdominal discomfort.    Hospital course:  * Partial small bowel obstruction ([place])- (present on admission) General surgery consulted on admission. CT abdomen/pelvis suggests left pelvis region obstruction with transition point. Small bowel protocol initiated with contrast seen in the colon. General surgery recommending non-surgical/conservative management at this time. Diet advanced to a soft diet. Recommendation for a low fiber diet on discharge.  Colostomy in place [facility]) Noted.  Hydronephrosis of left kidney- (present on admission) Present on admission. History of nephrostomy tubes and ureteral stent placement. Stable.  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 Follows with Dr. Marin Olp  Subcutaneous nodule of abdominal wall RUQ, subcutaneous. Unknown etiology. Biopsy performed on 1/23. Biopsy results pending prior to discharge.  COVID-19 virus infection- (present  on admission) Previous infection, still positive. Asymptomatic and not on treatment.  Stage 3a chronic kidney disease (CKD) ([place])- (present on admission) Stable.  Hydroureter on right- (present on admission) Stable.    Discharge Instructions  Discharge Instructions     Call MD for:  persistant nausea and vomiting   Complete by: As directed    Call MD for:  severe uncontrolled pain   Complete by: As directed    No wound care   Complete by: As directed       Allergies as of 01/10/2022       Reactions   Pollen Extract Other (See Comments)   Sinus congestion        Medication List     TAKE these medications    acetaminophen 325 MG tablet Commonly known as: TYLENOL Take 650 mg by mouth every 6 (six) hours as needed for mild pain, fever or headache.   mirtazapine 15 MG tablet Commonly known as: REMERON Take 1 tablet (15 mg total) by mouth at bedtime.   polyethylene glycol 17 g packet Commonly known as: MIRALAX / GLYCOLAX Take 17 g by mouth 2 (two) times daily. Titrate down to once daily if developing diarrhea.        Follow-up Information     Dettinger, [name], MD. Schedule an appointment as soon as possible for a visit in 1 week(s).   Specialties: Family Medicine, Cardiology Why: For hospital follow-up Contact information: [place] [address] [phone]         Volanda Napoleon, MD. Schedule an appointment as soon as possible for a visit in 1 week(s).   Specialty: Oncology Why: For hospital follow-up. Biopsy result. Contact information: [address] [place] [address] [phone]  Allergies  Allergen Reactions   Pollen Extract Other (See Comments)    Sinus congestion    Consultations: General surgery Medical oncology   Procedures/Studies: CT ABDOMEN PELVIS WO CONTRAST  Result Date: 01/07/2022 CLINICAL DATA:  Abdominal pain.  History of colostomy. EXAM: CT ABDOMEN AND  PELVIS WITHOUT CONTRAST TECHNIQUE: Multidetector CT imaging of the abdomen and pelvis was performed following the standard protocol without IV contrast. RADIATION DOSE REDUCTION: This exam was performed according to the departmental dose-optimization program which includes automated exposure control, adjustment of the mA and/or kV according to patient size and/or use of iterative reconstruction technique. COMPARISON:  12/09/2021 FINDINGS: Lower chest: Pulmonary nodules again noted in the lung bases. 14 mm nodule right lower lobe on image 5/series 4 is stable in the interval. Similar appearance small left pleural effusion. Hepatobiliary: Scattered hypoattenuating lesions in the liver parenchyma are similar to prior and incompletely characterized on noncontrast imaging. Gallbladder surgically absent. No intrahepatic or extrahepatic biliary dilation. Pancreas: Status post distal pancreatectomy. Spleen: Status post splenectomy. Adrenals/Urinary Tract: No adrenal nodule or mass. Right renal atrophy again noted with right-sided hydroureteronephrosis. Double-J internal ureteral stent noted left kidney with associated percutaneous nephrostomy tube in the left kidney. Bladder is decompressed. Stomach/Bowel: Stomach is decompressed. Duodenum is normally positioned as is the ligament of Treitz. Distal duodenal stent is similar to prior. Small bowel loops are more dilated on today's study with fluid-filled small bowel dilated up to 4.8 cm diameter in the mid abdomen deep to the umbilicus. Abrupt transition zone is not clearly demonstrated but appears to be in the left pelvis, similar in location to the prior study (axial 64/2). Distal small bowel loops of the pelvis are decompressed. Terminal ileum not well seen. The appendix is not well visualized, but there is no edema or inflammation in the region of the cecum. Left abdominal sigmoid end colostomy noted with Hartmann's pouch anatomy. Vascular/Lymphatic: There is mild  atherosclerotic calcification of the abdominal aorta without aneurysm. There is no gastrohepatic or hepatoduodenal ligament lymphadenopathy. No retroperitoneal or mesenteric lymphadenopathy. No pelvic sidewall lymphadenopathy. Reproductive: The prostate gland and seminal vesicles are unremarkable. Other: No intraperitoneal free fluid. Musculoskeletal: No worrisome lytic or sclerotic osseous abnormality. Stable appearance of soft tissue nodules anterior right abdominal wall. IMPRESSION: 1. Similar fluid-filled dilatation of proximal and mid small bowel with transition zone identified in the left pelvis. Imaging features suspicious for small bowel obstruction secondary to adhesion. 2. Left abdominal and sigmoid colostomy with Hartmann's pouch anatomy. 3. Left percutaneous nephrostomy tube with double-J left internal ureteral stent in place. 4. Right renal atrophy with right-sided hydroureteronephrosis, stable. 5. Stable appearance of small left pleural effusion. 6. Stable appearance of bilateral pulmonary nodules compatible with metastatic disease. 7. Status post distal pancreatectomy and splenectomy. 8. Aortic Atherosclerosis (ICD10-I70.0). Electronically Signed   By: Misty Tripodi M.D.   On: 01/07/2022 16:29   DG CHEST PORT 1 VIEW  Result Date: 01/07/2022 CLINICAL DATA:  COVID. EXAM: PORTABLE CHEST 1 VIEW COMPARISON:  Chest x-ray 06/11/2021. CT abdomen and pelvis 01/07/2022. CT 11/26/2021. FINDINGS: Right chest port catheter tip projects over the mid SVC. There is a moderate-sized left pleural effusion which may be loculated near the apex. Loculation may be new from prior study. Bilateral pulmonary nodules are again seen which appear similar to prior CT 11/26/2021. No new focal lung infiltrate. No pneumothorax. Cardiomediastinal silhouette appears stable. Heart is enlarged aorta is ectatic. No acute fractures are seen. IMPRESSION: 1. Moderate left pleural effusion is likely partially loculated  in the upper  hemithorax. Loculation may be new from prior. 2. Grossly unchanged bilateral pulmonary nodules. 3. No new focal lung infiltrate. Electronically Signed   By: Ronney Asters M.D.   On: 01/07/2022 21:56   DG Abd Portable 1V-Small Bowel Obstruction Protocol-24 hr delay  Result Date: 01/08/2022 CLINICAL DATA:  Small-bowel obstruction, 24 hour delay image. EXAM: PORTABLE ABDOMEN - 1 VIEW COMPARISON:  01/08/2022. FINDINGS: Distended loops of small bowel are noted in the left abdomen measuring up to 3.7 cm in diameter. There is associated fold thickening. A percutaneous nephrostomy tube terminates in the left upper quadrant. Contrast is present in the colon. Suture material is noted in the left lower quadrant. A left ureteral stent is in place. A stent is present in the region of the distal duodenum. No acute osseous abnormality. IMPRESSION: Persistent distended small bowel with fold thickening measuring up to 3.7 cm in diameter, slightly improved from the prior exam. Electronically Signed   By: Brett Fairy M.D.   On: 01/08/2022 23:59   DG Abd Portable 1V-Small Bowel Obstruction Protocol-initial, 8 hr delay  Result Date: 01/08/2022 CLINICAL DATA:  Small-bowel obstruction follow-up EXAM: PORTABLE ABDOMEN - 1 VIEW COMPARISON:  CT abdomen and pelvis 01/07/2022 FINDINGS: Abnormal gas-filled distended loops of small bowel in the mid and upper abdomen measuring up to 4.9 cm in diameter. The colon is normal caliber. No suspicious calcifications identified. Cholecystectomy clips. Left ureteral stent and left percutaneous nephrostomy tube. IMPRESSION: Persistent abnormal distended small bowel suggesting obstruction. Electronically Signed   By: Ofilia Neas M.D.   On: 01/08/2022 10:27   US BIOPSY (ABDOMINAL RETROPERTIONEAL MASS)  Result Date: 01/10/2022 INDICATION: 81 year old male referred for biopsy of abdominal wall mass, likely metastatic disease EXAM: ULTRASOUND-GUIDED BIOPSY SOFT TISSUE MASS MEDICATIONS: None.  ANESTHESIA/SEDATION: Moderate (conscious) sedation was employed during this procedure. A total of Versed 2.0 mg and Fentanyl 100 mcg was administered intravenously by the radiology nurse. Total intra-service moderate Sedation Time: 10 minutes. The patient's level of consciousness and vital signs were monitored continuously by radiology nursing throughout the procedure under my direct supervision. FLUOROSCOPY TIME:  None COMPLICATIONS: None PROCEDURE: Informed written consent was obtained from the patient after a thorough discussion of the procedural risks, benefits and alternatives. All questions were addressed. Maximal Sterile Barrier Technique was utilized including caps, mask, sterile gowns, sterile gloves, sterile drape, hand hygiene and skin antiseptic. A timeout was performed prior to the initiation of the procedure. Ultrasound survey was performed with images stored and sent to PACs. The anterior right abdominal wall was prepped with chlorhexidine in a sterile fashion, and a sterile drape was applied covering the operative field. A sterile gown and sterile gloves were used for the procedure. Local anesthesia was provided with 1% Lidocaine. Ultrasound guidance was used to infiltrate the region with 1% lidocaine for local anesthesia. Using ultrasound guidance, four separate 18 gauge core biopsy were then acquired of the heterogeneously hypoechoic pathologic nodule using ultrasound guidance. Images were stored. Final image was stored after biopsy. Patient tolerated the procedure well and remained hemodynamically stable throughout. No complications were encountered and no significant blood loss was encounter IMPRESSION: Status post ultrasound-guided biopsy of right abdominal wall mass, likely metastatic disease. Signed, Dulcy Fanny. Dellia Nims, RPVI [facility] Radiology Electronically Signed   By: Corrie Mckusick D.O.   On: 01/10/2022 13:14   IR NEPHROSTOMY EXCHANGE  LEFT  Result Date: 12/23/2021 INDICATION: 81 year old male with a history of metastatic pancreatic cancer with  left ureteral obstruction. EXAM: Nephrostomy tube exchange COMPARISON:  None. MEDICATIONS: None ANESTHESIA/SEDATION: None CONTRAST:  5 mL Omnipaque 300-administered into the collecting system(s) FLUOROSCOPY TIME:  Fluoroscopy Time: 2 minutes 54 seconds (11 mGy). COMPLICATIONS: None immediate. PROCEDURE: Informed written consent was obtained from the patient after a thorough discussion of the procedural risks, benefits and alternatives. All questions were addressed. Maximal Sterile Barrier Technique was utilized including caps, mask, sterile gowns, sterile gloves, sterile drape, hand hygiene and skin antiseptic. A timeout was performed prior to the initiation of the procedure. An initial hand injection of contrast material was performed. The existing catheter has pulled back into the lower pole calyx. The catheter was transected and carefully removed over a hydrophilic road runner wire. The wire was navigated back into the renal pelvis. A new Cook 10 [place] all-purpose drainage catheter was then advanced over the wire and positioned in the renal pelvis. The locking pigtail loop was formed. The catheter was secured to the skin with 0 Prolene suture and an adhesive fixation device. Contrast was injected through the new tube confirming its location in the renal pelvis. Images were obtained and stored for the medical record. IMPRESSION: Successful exchange of left-sided 10 French percutaneous nephrostomy tube. Electronically Signed   By: Jacqulynn Cadet M.D.   On: 12/23/2021 17:04      Subjective: No emesis overnight. He does report some belching and reflux.  Discharge Exam: Vitals:   01/10/22 1347 01/10/22 1420  BP: (!) 149/89 131/86  Pulse: 69 67  Resp:    Temp:    SpO2: 98% 95%   Vitals:   01/10/22 1314 01/10/22 1316 01/10/22 1347 01/10/22 1420  BP:  132/80 (!) 149/89 131/86  Pulse: 66  63 69 67  Resp:      Temp:      TempSrc:      SpO2: 98% 97% 98% 95%  Weight:      Height:        General: Pt is alert, awake, not in acute distress Cardiovascular: RRR, S1/S2 +, no rubs, no gallops Respiratory: CTA bilaterally, no wheezing, no rhonchi Abdominal: Soft, NT, ND, bowel sounds + Extremities: no edema, no cyanosis    The results of significant diagnostics from this hospitalization (including imaging, microbiology, ancillary and laboratory) are listed below for reference.     Microbiology: Recent Results (from the past 240 hour(s))  Resp Panel by RT-PCR (Flu A&B, Covid) Nasopharyngeal Swab     Status: Abnormal   Collection Time: 01/07/22  5:55 PM   Specimen: Nasopharyngeal Swab; Nasopharyngeal(NP) swabs in vial transport medium  Result Value Ref Range Status   SARS Coronavirus 2 by RT PCR POSITIVE (A) NEGATIVE Final    Comment: (NOTE) SARS-CoV-2 target nucleic acids are DETECTED.  The SARS-CoV-2 RNA is generally detectable in upper respiratory specimens during the acute phase of infection. Positive results are indicative of the presence of the identified virus, but do not rule out bacterial infection or co-infection with other pathogens not detected by the test. Clinical correlation with patient history and other diagnostic information is necessary to determine patient infection status. The expected result is Negative.  Fact Sheet for Patients: EntrepreneurPulse.com.au  Fact Sheet for Healthcare Providers: IncredibleEmployment.be  This test is not yet approved or cleared by the [place] FDA and  has been authorized for detection and/or diagnosis of SARS-CoV-2 by FDA under an Emergency Use Authorization (EUA).  This EUA will remain in effect (meaning this test can be used) for the duration of  the COVID-19 declaration under Section 564(b)(1) of the A ct, 21 U.S.C. section 360bbb-3(b)(1), unless the authorization  is terminated or revoked sooner.     Influenza A by PCR NEGATIVE NEGATIVE Final   Influenza B by PCR NEGATIVE NEGATIVE Final    Comment: (NOTE) The Xpert Xpress SARS-CoV-2/FLU/RSV plus assay is intended as an aid in the diagnosis of influenza from Nasopharyngeal swab specimens and should not be used as a sole basis for treatment. Nasal washings and aspirates are unacceptable for Xpert Xpress SARS-CoV-2/FLU/RSV testing.  Fact Sheet for Patients: EntrepreneurPulse.com.au  Fact Sheet for Healthcare Providers: IncredibleEmployment.be  This test is not yet approved or cleared by the [place] FDA and has been authorized for detection and/or diagnosis of SARS-CoV-2 by FDA under an Emergency Use Authorization (EUA). This EUA will remain in effect (meaning this test can be used) for the duration of the COVID-19 declaration under Section 564(b)(1) of the Act, 21 U.S.C. section 360bbb-3(b)(1), unless the authorization is terminated or revoked.  Performed at [facility], [address] [place], [place] [address]      Labs: BNP (last 3 results) No results for input(s): BNP in the last 8760 hours. Basic Metabolic Panel: Recent Labs  Lab 01/07/22 1607 01/08/22 0422 01/09/22 0508 01/10/22 0248  NA 136 140 136 137  K 4.9 4.4 4.5 4.5  CL 103 107 101 102  CO2 22 25 26 28   GLUCOSE 109* 93 90 88  BUN 31* 31* 28* 30*  CREATININE 1.72* 1.61* 1.64* 1.72*  CALCIUM 9.2 8.7* 8.6* 8.6*   Liver Function Tests: Recent Labs  Lab 01/07/22 1607 01/08/22 0422 01/09/22 0508 01/10/22 0248  AST 47* 33 24 23  ALT 35 29 24 21   ALKPHOS 70 62 63 63  BILITOT 0.5 0.7 0.6 0.5  PROT 7.9 6.6 6.6 6.7  ALBUMIN 3.3* 3.0* 2.8* 2.9*   Recent Labs  Lab 01/07/22 1607  LIPASE 55*   No results for input(s): AMMONIA in the last 168 hours. CBC: Recent Labs  Lab 01/07/22 1607 01/08/22 0422 01/09/22 0508 01/10/22 0248  WBC 11.7* 10.1 12.8* 13.5*   NEUTROABS 9.3* 7.0 9.5* 10.0*  HGB 13.1 11.2* 11.5* 11.6*  HCT 40.2 35.6* 36.2* 37.1*  MCV 91.8 94.9 93.8 93.9  PLT 618* 642* 652* 653*   Cardiac Enzymes: No results for input(s): CKTOTAL, CKMB, CKMBINDEX, TROPONINI in the last 168 hours. BNP: Invalid input(s): POCBNP CBG: No results for input(s): GLUCAP in the last 168 hours. D-Dimer Recent Labs    01/09/22 0508 01/10/22 0248  DDIMER 3.43* 4.05*   Hgb A1c No results for input(s): HGBA1C in the last 72 hours. Lipid Profile No results for input(s): CHOL, HDL, LDLCALC, TRIG, CHOLHDL, LDLDIRECT in the last 72 hours. Thyroid function studies No results for input(s): TSH, T4TOTAL, T3FREE, THYROIDAB in the last 72 hours.  Invalid input(s): FREET3 Anemia work up No results for input(s): VITAMINB12, FOLATE, FERRITIN, TIBC, IRON, RETICCTPCT in the last 72 hours. Urinalysis    Component Value Date/Time   COLORURINE YELLOW 12/10/2021 0032   APPEARANCEUR CLOUDY (A) 12/10/2021 0032   LABSPEC 1.025 12/10/2021 0032   PHURINE 5.5 12/10/2021 0032   GLUCOSEU NEGATIVE 12/10/2021 0032   HGBUR LARGE (A) 12/10/2021 0032   BILIRUBINUR NEGATIVE 12/10/2021 0032   KETONESUR NEGATIVE 12/10/2021 0032   PROTEINUR >300 (A) 12/10/2021 0032   UROBILINOGEN 0.2 08/23/2015 1555   NITRITE NEGATIVE 12/10/2021 0032   LEUKOCYTESUR SMALL (A) 12/10/2021 0032   Sepsis Labs Invalid input(s): PROCALCITONIN,  WBC,  LACTICIDVEN Microbiology Recent Results (from the past 240 hour(s))  Resp Panel by RT-PCR (Flu A&B, Covid) Nasopharyngeal Swab     Status: Abnormal   Collection Time: 01/07/22  5:55 PM   Specimen: Nasopharyngeal Swab; Nasopharyngeal(NP) swabs in vial transport medium  Result Value Ref Range Status   SARS Coronavirus 2 by RT PCR POSITIVE (A) NEGATIVE Final    Comment: (NOTE) SARS-CoV-2 target nucleic acids are DETECTED.  The SARS-CoV-2 RNA is generally detectable in upper respiratory specimens during the acute phase of infection. Positive  results are indicative of the presence of the identified virus, but do not rule out bacterial infection or co-infection with other pathogens not detected by the test. Clinical correlation with patient history and other diagnostic information is necessary to determine patient infection status. The expected result is Negative.  Fact Sheet for Patients: EntrepreneurPulse.com.au  Fact Sheet for Healthcare Providers: IncredibleEmployment.be  This test is not yet approved or cleared by the [place] FDA and  has been authorized for detection and/or diagnosis of SARS-CoV-2 by FDA under an Emergency Use Authorization (EUA).  This EUA will remain in effect (meaning this test can be used) for the duration of  the COVID-19 declaration under Section 564(b)(1) of the A ct, 21 U.S.C. section 360bbb-3(b)(1), unless the authorization is terminated or revoked sooner.     Influenza A by PCR NEGATIVE NEGATIVE Final   Influenza B by PCR NEGATIVE NEGATIVE Final    Comment: (NOTE) The Xpert Xpress SARS-CoV-2/FLU/RSV plus assay is intended as an aid in the diagnosis of influenza from Nasopharyngeal swab specimens and should not be used as a sole basis for treatment. Nasal washings and aspirates are unacceptable for Xpert Xpress SARS-CoV-2/FLU/RSV testing.  Fact Sheet for Patients: EntrepreneurPulse.com.au  Fact Sheet for Healthcare Providers: IncredibleEmployment.be  This test is not yet approved or cleared by the [place] FDA and has been authorized for detection and/or diagnosis of SARS-CoV-2 by FDA under an Emergency Use Authorization (EUA). This EUA will remain in effect (meaning this test can be used) for the duration of the COVID-19 declaration under Section 564(b)(1) of the Act, 21 U.S.C. section 360bbb-3(b)(1), unless the authorization is terminated or revoked.  Performed at [facility], [address], [place],  [address]      Time coordinating discharge: 35 minutes  SIGNED:   Cordelia Poche, MD [facility] 01/10/2022, 4:41 PM

## 2022-01-10 NOTE — Procedures (Signed)
Interventional Radiology Procedure Note  Procedure: US guided biopsy of right abdominal wall mass, likely mets   Complications: None EBL: None Recommendations: - Bedrest 2 hours.   - Routine wound care - Follow up pathology - Advance diet per primary order  Signed,  Corrie Mckusick, DO

## 2022-01-10 NOTE — Discharge Instructions (Signed)
Low Fiber Nutrition Therapy   You may need a low-fiber diet if you have Crohn's disease, diverticulitis, gastroparesis, ulcerative colitis, a new colostomy, or new ileostomy. A low-fiber diet may also be needed following radiation therapy to the pelvis and lower bowel or recent intestinal surgery.  A low-fiber diet reduces the frequency and volume of your stools. This lessens irritation to the gastrointestinal (GI) tract and can help you heal. Use this diet if you have a stricture so your intestine doesn't get blocked. The goal of this diet is to get less than 8 grams of fiber daily. It's also important to eat enough protein foods while you are on a low-fiber diet.  Drink nutrition supplements that have 1 gram of fiber or less in each serving. If your stricture is severe or if your inflammation is severe, drink more liquids to reduce symptoms and to get enough calories and protein.  Tips Eat about 5 to 6 small meals daily or about every 3 to 4 hours. Do not skip meals.  Every time you eat, include a small amount of protein (1 to 2 ounces) plus an additional food. Low fiber starch foods are the best choice to eat with protein.  Limit acidic, spicy and high-fat or fried and greasy foods to reduce GI symptoms.  Do not eat raw fruits and vegetables while on this diet. All fruits and vegetables need to be cooked and without peels or skins.  Drink a lot of fluids, at least 8 cups of fluid each day. Limit drinks with caffeine, sugar, and sugar substitutes.  Plain water is the best choice. Avoid mixing drink packets or flavor drops into water. .  Take a chewable multivitamin with minerals. Gummy vitamins do not have enough minerals and can block an ostomy and non-chewable supplements are not easily digested. Chewable supplements must be used if you have a stricture or ostomy.  If you are lactose intolerant, you may need to eat low-lactose dairy products. If you can't tolerate dairy, ask your RDN about how  you can get enough calcium from other foods.  Do not take a calcium supplement. They can cause a blockage.  It is important to add high-calcium foods gradually to your diet and monitor for symptoms to avoid a blockage.  Do not add more fiber to your diet until your health care provider or registered dietitian nutritionist (RDN) tells you it's OK. Fiber is part of whole grains, fruits and vegetables (foods from plants) and needs to be slowly added back in to your diet when your body is healed.  Choose foods that have been safely handled and prepared to lower your risk of foodborne illness. Talk to your RDN or see the Food Safety Nutrition Therapy handout for more information.   Foods Recommended These foods are low in fat and fiber and will help with your GI symptoms. Food Group Foods Recommended  Grains  Choose grain foods with less than 2 grams of fiber per serving. Refined white flour products--for example, enriched white bread without seeds, crackers or pasta Cream of wheat or rice Grits (fine ground) Tortillas: white flour or corn White rice, well-cooked (do not rinse, or soak before cooking) Cold and hot cereals made from white or refined flour such as puffed rice or corn flakes  Protein Foods  Lean, very tender, well-cooked poultry or fish; red meats: beef, pork or lamb (slow cook until soft; chop meats if you have stricture or ostomy) Eggs, well-cooked Smooth nut butters such as almond,  peanut, or sunflower Tofu  Dairy  If you have lactose intolerance, drinking milk products from cows or goats may make diarrhea worse. Foods marked with an asterisk (*) have lactose. Milk: fat-free, 1% or 2% * (choose best tolerated) Lactose-free milk Buttermilk* Fortified non-dairy milks: almond, cashew, coconut, or rice (be aware that these options are not good sources of protein so you will need to eat an additional protein food) Kefir* (Don't include kefir in the diet until approved by your health  care provider) Yogurt*/lactose-free yogurt (without nuts, fruit, granola or chocolate) Mild cheese* (hard and aged cheeses tend to be lower in lactose such as cheddar, swiss or parmesan) Cottage cheese* or lactose-free cottage cheese Low-fat ice cream* or lactose-free ice cream Sherbet* (usually lower lactose)  Vegetables  Canned and well-cooked vegetables without seeds, skins, or hulls  Carrots or green beans, cooked White, red or yellow potatoes without skins Strained vegetable juice  Fruit Soft, and well-cooked fruits without skins, seeds, or membranes Canned fruit in juice: peaches, pears, or applesauce Fruit juice without pulp diluted by half with water may be tolerated better Fruit drinks fortified with vitamin C may be tolerated better than 100% fruit juice  Oils  When possible, choose healthy oils and fats, such as olive and canola oils, plant oils rather than solid fats.  Other  Broth and strained soups made from allowed foods Desserts (small portions) without whole grains, seeds, nuts, raisins, or coconut Jelly (clear)   Foods Not Recommended These foods are higher in fat and fiber and may make your GI symptoms worse.  Food Group Foods Not Recommended  Grains  Bread, whole wheat or with whole grain flour or seeds or nuts Brown rice, quinoa, kasha, barley Tortillas: whole grain Whole wheat pasta Whole grain and high-fiber cereals, including oatmeal, bran flakes or shredded wheat Popcorn  Protein Foods  Steak, pork chops, or other meats that are fatty or have gristle Fried meat, poultry, or fish Seafood with a tough or rubbery texture, such as shrimp Luncheon meats such as bologna and salami Sausage, bacon, or hot dogs Dried beans, peas, or lentils Hummus Sushi Nuts and chunky nut butters  Dairy  Whole milk Pea milk and soymilk (may cause diarrhea, gas, bloating, and abdominal pain) Cream Half-and-half Sour cream Yogurt with added fruit, nuts, or granola or chocolate   Vegetables  Alfalfa or bean sprouts (high fiber and risk for bacteria) Raw or undercooked vegetables: beets; broccoli; brussels sprouts; cabbage; cauliflower; collard, mustard, or turnip greens; corn; cucumber; green peas or any kind of peas; kale; lima beans; mushrooms; okra; olives; pickles and relish; onions; parsnips; peppers; potato skins; sauerkraut; spinach; tomatoes  Fruit Raw fruit Dried fruit Avocado, berries, coconut Canned fruit in syrup Canned fruit with mandarin oranges, papaya or pineapple Fruit juice with pulp Prune juice Fruit skin  Oils  Pork rinds   Low-Fiber (8 grams) Sample 1-Day Menu  Breakfast  cup cream of wheat (0.5 gram fiber)  1 slice white toast (1 gram fiber)  1 teaspoon margarine, soft tub  2 scrambled eggs   Morning Snack 1 cup lactose-free nutrition supplement  Lunch 2 slices white bread (2 grams fiber)  3 tablespoons tuna  1 tablespoon mayonnaise  1 cup chicken noodle soup (1 gram fiber)   cup apple juice   Afternoon Snack 6 saltine crackers (0.5 gram fiber)  2 ounces low-fat cheddar cheese  Evening Meal 3 ounces tender chicken breast  1 cup white rice (0.5 gram fiber)   cup  cooked canned green beans (2 grams fiber)   cup cranberry juice   Evening Snack 1 cup lactose-free nutrition supplement   Copyright 2020  Academy of Nutrition and Dietetics  High Fiber Nutrition Therapy  Fiber and fluid may help you feel less constipated and bloated and can also help ease diarrhea. Increase fiber slowly over the course of a few weeks. This will keep your symptoms from getting worse. Tips Tips for Adding Fiber to Your Eating Plan Slowly increase the amount of fiber you eat to 25 to 35 grams per day. Eat whole grain breads and cereals. Look for choices with 100% whole wheat, rye, oats, or bran as the first or second ingredient. Have brown or wild rice instead of white rice or potatoes. Enjoy a variety of grains. Good choices include barley, oats,  farro, kamut, and quinoa. Bake with whole wheat flour. You can use it to replace some white or all-purpose flour in recipes. Enjoy baked beans more often! Add dried beans and peas to casseroles or soups. Choose fresh fruit and vegetables instead of juices. Eat fruits and vegetables with peels or skins on. Compare food labels of similar foods to find higher fiber choices. On packaged foods, the amount of fiber per serving is listed on the Nutrition Facts label. Check the Nutrition Facts labels and try to choose products with at least 4 g dietary fiber per serving. Drink plenty of fluids. Set a goal of at least 8 cups per day. You may need even more fluid as you eat higher amounts of fiber. Fluid helps your body process fiber without discomfort. Foods Recommended Foods With at Least 4 g Fiber per Serving Food Group Choose  Grains ?- cup high-fiber cereal  Dried beans and peas  cup cooked red beans, kidney beans, large lima beans, navy beans, pinto beans, white beans, lentils, or black-eyed peas  Vegetables 1 artichoke (cooked)  Fruits  cup blackberries or raspberries 4 dried prunes   Foods With 1 to 3 g Fiber per Serving Food Group Choose  Grains 1 bagel (1.6-XWRU diameter) 1 slice whole wheat, cracked wheat, pumpernickel, or rye bread 2-inch square cornbread 4 whole wheat crackers 1 bran, blueberry, cornmeal, or English muffin  cup cereal with 1-3 g fiber per serving (check dietary fiber on the product's Nutrition Facts label) 2 tablespoons wheat germ or whole wheat flour  Fruits 1 apple (3-inch diameter) or  cup applesauce  cup apricots (canned) 1 banana  cup cherries (canned or fresh)  cup cranberries (fresh) 3 dates 2 medium figs (fresh)  cup fruit cocktail (canned)  grapefruit 1 kiwi fruit 1 orange (2-inch diameter) 1 peach (fresh) or  cup peaches (canned) 1 pear (fresh) or  cup pears (canned) 1 plum (2-inch diameter)  cup raisins  cup strawberries  (fresh) 1 tangerine  Vegetables  cup bean sprouts (raw)  cup beets (diced, canned)  cup broccoli, brussels sprouts, or cabbage  (cooked)  cup carrots  cup cauliflower  cup corn  cup eggplant  cup okra (boiled)  cup potatoes (baked or mashed)  cup spinach, kale, or turnip greens (cooked)  cup squash--winter, summer, or zucchini (cooked)  cup sweet potatoes or yams  cup tomatoes (canned)  Other 2 tablespoons almonds or peanuts 1 cup popcorn (popped)   High Fiber Vegetarian (Lacto-Ovo) Sample 1-Day Menu  Breakfast  cup bran cereal  1 banana  cup blueberries 1 cup 1% milk  Lunch 2 slices whole wheat bread  2 tablespoons hummus 1 ounce cheddar cheese  1 leaf lettuce 2 slices tomato  cup vegetarian baked beans 1 orange 1 cup 1% milk  Evening Meal Stir fry made with:  cup tempeh   cup brown rice 1 cup frozen broccoli 1 tablespoon soy sauce  cup peanuts  1 pear  Evening Snack 6 ounces fruit yogurt  1 cup air popped popcorn  High Fiber Vegan Sample 1-Day Menu  Breakfast  cup bran cereal  1 banana  cup blueberries 1 cup soymilk fortified with calcium, vitamin B12, and vitamin D  Lunch  cup chili with beans with:   cup tempeh crumbles  cup crushed whole wheat crackers 1 apple 1 cup soymilk fortified with calcium, vitamin B12, and vitamin D  Evening Meal 1 veggie burger  1 whole wheat bun  1 leaf lettuce  1 slice tomato Salad made with: 1 cup lettuce  cup chickpeas   cucumbers 1 tablespoon italian dressing 1 cup strawberries   Evening Snack  cup almonds  1 cup carrot sticks  High Fiber Sample 1-Day Menu  Breakfast 1/2 cup orange juice, with pulp  1/2 cup raisin bran 1 cup fat-free milk 1 cup coffee  Morning Snack 1 cup plain yogurt  2 cups water  Lunch 1 1/2 cups chili  1/2 cup kidney beans 1/2 cup soy crumble 2 tablespoons shredded cheese 8 whole wheat crackers 1 apple (with skin)   Evening Meal 2 ounces sliced chicken  1/4 cup  tofu 2 cups mixed fresh vegetables 1 cup brown rice 1/2 cup strawberries 1 cup hot tea  Evening Snack 2 tablespoons almonds  1 cup hot chocolate   Copyright 2020  Academy of Nutrition and Dietetics

## 2022-01-10 NOTE — Progress Notes (Signed)
°  Transition of Care [facility]) Screening Note   Patient Details  Name: Alferd Obryant Date of Birth: 1941/10/18   Transition of Care [facility]) CM/SW Contact:    Lennart Pall, LCSW Phone Number: 01/10/2022, 12:33 PM    Transition of Care Department [facility]) has reviewed patient and no TOC needs have been identified at this time. We will continue to monitor patient advancement through interdisciplinary progression rounds. If new patient transition needs arise, please place a TOC consult.

## 2022-01-10 NOTE — Progress Notes (Signed)
Patient ID: Kevin Villegas, male   DOB: [birth year], 81 y.o.   MRN: [id number] [facility] Surgery Progress Note     Subjective: CC-  Able to tolerate small portions. Denies any n/v. Colostomy is productive.   Objective: Vital signs in last 24 hours: Temp:  [97.7 F (36.5 C)-98 F (36.7 C)] 97.7 F (36.5 C) (01/23 0557) Pulse Rate:  [72-73] 72 (01/23 0557) Resp:  [14-18] 18 (01/23 0557) BP: (123-139)/(86-97) 123/86 (01/23 0557) SpO2:  [94 %-98 %] 94 % (01/23 0557) Last BM Date: 01/09/22  Intake/Output from previous day: 01/22 0701 - 01/23 0700 In: 480 [P.O.:480] Out: 1000 [Urine:950; Stool:50] Intake/Output this shift: Total I/O In: -  Out: 150 [Urine:150]  PE: Abd: soft, NT, ND, ostomy with stool in pouch  Lab Results:  Recent Labs    01/09/22 0508 01/10/22 0248  WBC 12.8* 13.5*  HGB 11.5* 11.6*  HCT 36.2* 37.1*  PLT 652* 653*   BMET Recent Labs    01/09/22 0508 01/10/22 0248  NA 136 137  K 4.5 4.5  CL 101 102  CO2 26 28  GLUCOSE 90 88  BUN 28* 30*  CREATININE 1.64* 1.72*  CALCIUM 8.6* 8.6*   PT/INR No results for input(s): LABPROT, INR in the last 72 hours. CMP     Component Value Date/Time   NA 137 01/10/2022 0248   NA 139 12/31/2021 1127   K 4.5 01/10/2022 0248   CL 102 01/10/2022 0248   CO2 28 01/10/2022 0248   GLUCOSE 88 01/10/2022 0248   BUN 30 (H) 01/10/2022 0248   BUN 38 (H) 12/31/2021 1127   CREATININE 1.72 (H) 01/10/2022 0248   CREATININE 1.58 (H) 11/18/2021 1149   CALCIUM 8.6 (L) 01/10/2022 0248   PROT 6.7 01/10/2022 0248   PROT 7.2 12/31/2021 1127   ALBUMIN 2.9 (L) 01/10/2022 0248   ALBUMIN 3.9 12/31/2021 1127   AST 23 01/10/2022 0248   AST 44 (H) 11/18/2021 1149   ALT 21 01/10/2022 0248   ALT 45 (H) 11/18/2021 1149   ALKPHOS 63 01/10/2022 0248   BILITOT 0.5 01/10/2022 0248   BILITOT 0.3 12/31/2021 1127   BILITOT 0.3 11/18/2021 1149   GFRNONAA 40 (L) 01/10/2022 0248   GFRNONAA 44 (L) 11/18/2021 1149   GFRAA >60  11/24/2019 1600   Lipase     Component Value Date/Time   LIPASE 55 (H) 01/07/2022 1607       Studies/Results: DG Abd Portable 1V-Small Bowel Obstruction Protocol-24 hr delay  Result Date: 01/08/2022 CLINICAL DATA:  Small-bowel obstruction, 24 hour delay image. EXAM: PORTABLE ABDOMEN - 1 VIEW COMPARISON:  01/08/2022. FINDINGS: Distended loops of small bowel are noted in the left abdomen measuring up to 3.7 cm in diameter. There is associated fold thickening. A percutaneous nephrostomy tube terminates in the left upper quadrant. Contrast is present in the colon. Suture material is noted in the left lower quadrant. A left ureteral stent is in place. A stent is present in the region of the distal duodenum. No acute osseous abnormality. IMPRESSION: Persistent distended small bowel with fold thickening measuring up to 3.7 cm in diameter, slightly improved from the prior exam. Electronically Signed   By: Brett Fairy M.D.   On: 01/08/2022 23:59    Anti-infectives: Anti-infectives (From admission, onward)    None        Assessment/Plan Metastatic pancreatic cancer - followed by Dr. Marin Olp Partial SBO - Patient has been able to tolerate a diet and he continues to have bowel  function. Would not recommend any surgery. We again discussed that he is high risk for surgical intervention and we could certainly make him worse rather than better. Advised continuing medical management with diet modification (low residue, small/ frequent portions) and miralax 2-3 days a day.  ID - none FEN - NPO for procedure VTE - sq heparin Foley - none  Straightforward Medical Decision Making   LOS: 3 days    [place]ellington [place], [facility] Surgery 01/10/2022, 11:17 AM Please see Amion for pager number during day hours 7:00am-4:30pm

## 2022-01-10 NOTE — Progress Notes (Signed)
Overall, he seems to be improving.  He says he is eating.  He says not much is coming out of his colostomy.  He says after he eats, he has a lot of abdominal discomfort and rumbling.  He does not vomit.  I think is going to have this abdominal wall nodule biopsied.  Just to clarify his "philosophy."  He will take chemotherapy.  He says he will take chemotherapy if it will help improve his status and prevents him from having to come back to the hospital.  As such, I think there is a role that surgery can play in this situation, I do not think there would be a problem.  We have been trying to hold off on chemotherapy as long as possible since he really has not had a lot of symptomatic issues.  However, I suspect that we probably are going to have to initiate some form of therapy once we have this biopsy result back.  We could certainly assess for response by the decreasing this subcutaneous nodule if this is truly malignant.  I still do not hear a lot of bowel sounds when I listen to him on exam.  He is not distended.  The colostomy is not full.  His labs show a BUN 30 creatinine 1.72.  His white cell count 13.5.  Hemoglobin 11.6.  Platelet count 653,000.  Again, if surgery feels that there could be some benefit to going in and taken out this obstruction, he would be amenable to this.  Again he would take chemotherapy.  I realize I does have incurable disease but we can at least try to improve his quality of life by surgery and chemotherapy if that is felt to be the best way to go.  I do appreciate everybody's help with Mr. Hayhurst.  He does have a decent performance status.   Lattie Haw, MD  Job 22:21

## 2022-01-11 ENCOUNTER — Encounter: Payer: Self-pay | Admitting: *Deleted

## 2022-01-11 LAB — SURGICAL PATHOLOGY

## 2022-01-11 NOTE — Progress Notes (Signed)
Patient discharged from the hospital yesterday. His appointment for today has been cancelled. Prior to discharge patient had a biopsy performed. Will follow for path results.   Oncology Nurse Navigator Documentation  Oncology Nurse Navigator Flowsheets 01/11/2022  Navigator Follow Up Date: 01/14/2022  Navigator Follow Up Reason: Pathology  Navigator Location [facility]  Navigator Encounter Type Appt/Treatment Plan Review  Telephone -  Patient Visit Type MedOnc  Treatment Phase Active Tx  Barriers/Navigation Needs Coordination of Care;Education  Education -  Interventions None Required  Acuity Level 2-Minimal Needs (1-2 Barriers Identified)  Coordination of Care -  Education Method -  Support Groups/Services Friends and Family  Time Spent with Patient 15

## 2022-01-12 ENCOUNTER — Telehealth: Payer: Self-pay

## 2022-01-12 NOTE — Telephone Encounter (Signed)
Transition Care Management Follow-up Telephone Call Date of discharge and from where: 01/10/22, [name] Diagnosis: Small Bowel Obstruction How have you been since you were released from the hospital? Pt states he is doing okay, other than feeling weak. Any questions or concerns? No  Items Reviewed: Did the pt receive and understand the discharge instructions provided? Yes  Medications obtained and verified? Yes  Other? No  Any new allergies since your discharge? No  Dietary orders reviewed? Yes Do you have support at home? Yes   Home Care and Equipment/Supplies: Were home health services ordered? not applicable If so, what is the name of the agency? N/A  Has the agency set up a time to come to the patient's home? not applicable Were any new equipment or medical supplies ordered?  No What is the name of the medical supply agency? N/A Were you able to get the supplies/equipment? not applicable Do you have any questions related to the use of the equipment or supplies? No  Functional Questionnaire: (I = Independent and D = Dependent) ADLs: I  Bathing/Dressing- I  Meal Prep- I  Eating- I  Maintaining continence- I  Transferring/Ambulation- I  Managing Meds- I  Follow up appointments reviewed:  PCP Hospital f/u appt confirmed?  PT DECLINED SCHEDULING A FOLLOW UP APPOINTMENT AT THIS TIME.   [facility] f/u appt confirmed? Yes  Scheduled to see Dr. Burney Gauze in 1 week. Are transportation arrangements needed? No  If their condition worsens, is the pt aware to call PCP or go to the Emergency Dept.? Yes Was the patient provided with contact information for the PCP's office or ED? Yes Was to pt encouraged to call back with questions or concerns? Yes

## 2022-01-13 ENCOUNTER — Encounter: Payer: Self-pay | Admitting: *Deleted

## 2022-01-13 NOTE — Progress Notes (Signed)
Pathology reviewed with Dr Marin Olp. Initially he requested a Foundation One be sent, but once order was initiated it was discovered that Foundation One was sent on a different specimen in 11/2021. Order cancelled.   Oncology Nurse Navigator Documentation  Oncology Nurse Navigator Flowsheets 01/13/2022  Navigator Follow Up Date: 01/31/2022  Navigator Follow Up Reason: Follow-up Appointment  Navigator Location [facility]  Navigator Encounter Type Pathology Review  Telephone -  Patient Visit Type MedOnc  Treatment Phase Active Tx  Barriers/Navigation Needs Coordination of Care;Education  Education -  Interventions Coordination of Care  Acuity Level 1-No Barriers  Coordination of Care Pathology  Education Method -  Support Groups/Services Friends and Family  Time Spent with Patient 30

## 2022-01-17 ENCOUNTER — Other Ambulatory Visit: Payer: Self-pay

## 2022-01-17 ENCOUNTER — Encounter: Payer: Self-pay | Admitting: *Deleted

## 2022-01-17 ENCOUNTER — Ambulatory Visit (HOSPITAL_COMMUNITY)
Admission: RE | Admit: 2022-01-17 | Discharge: 2022-01-17 | Disposition: A | Discharge status: Home / Self Care | Payer: Medicare Other | Source: Ambulatory Visit | Attending: Interventional Radiology | Admitting: Interventional Radiology

## 2022-01-17 DIAGNOSIS — Z436 Encounter for attention to other artificial openings of urinary tract: Secondary | ICD-10-CM | POA: Diagnosis not present

## 2022-01-17 DIAGNOSIS — C252 Malignant neoplasm of tail of pancreas: Secondary | ICD-10-CM | POA: Diagnosis not present

## 2022-01-17 HISTORY — PX: IR NEPHROSTOMY EXCHANGE LEFT: IMG6069

## 2022-01-17 MED ORDER — IOHEXOL 300 MG/ML  SOLN
100.0000 mL | Freq: Once | INTRAMUSCULAR | Status: AC | PRN
  Administered 2022-01-17: 10 mL

## 2022-01-17 MED ORDER — LIDOCAINE HCL 1 % IJ SOLN
INTRAMUSCULAR | Status: DC | PRN
  Administered 2022-01-17: 5 mL via INTRADERMAL

## 2022-01-17 NOTE — Progress Notes (Signed)
Patient calling with request for biopsy results. Results given. He is aware of his next appointment on 01/31/22.   Oncology Nurse Navigator Documentation  Oncology Nurse Navigator Flowsheets 01/17/2022  Navigator Follow Up Date: 01/31/2022  Navigator Follow Up Reason: Follow-up Appointment  Navigator Location [facility]  Navigator Encounter Type Telephone  Telephone Diagnostic Results;Incoming Call  Patient Visit Type MedOnc  Treatment Phase Active Tx  Barriers/Navigation Needs Coordination of Care;Education  Education Other  Interventions Education;Psycho-Social Support  Acuity Level 2-Minimal Needs (1-2 Barriers Identified)  Coordination of Care -  Education Method Verbal  Support Groups/Services Friends and Family  Time Spent with Patient 15

## 2022-01-18 ENCOUNTER — Other Ambulatory Visit: Payer: Self-pay | Admitting: Hematology & Oncology

## 2022-01-18 ENCOUNTER — Other Ambulatory Visit (HOSPITAL_COMMUNITY): Payer: Self-pay | Admitting: Interventional Radiology

## 2022-01-18 DIAGNOSIS — C252 Malignant neoplasm of tail of pancreas: Secondary | ICD-10-CM

## 2022-01-24 ENCOUNTER — Telehealth: Payer: Self-pay | Admitting: Family Medicine

## 2022-01-24 NOTE — Telephone Encounter (Signed)
°  Left message for patient to call back and schedule Medicare Annual Wellness Visit (AWV) to be completed by video or phone.  No hx of AWV eligible for AWVI as of  12/19/2009 per [employer]    Please schedule at anytime with [place] --- Karle Starch  45 Minutes appointment   Any questions, please call me at [phone]

## 2022-01-28 ENCOUNTER — Other Ambulatory Visit: Payer: Self-pay

## 2022-01-28 ENCOUNTER — Encounter: Payer: Self-pay | Admitting: *Deleted

## 2022-01-28 ENCOUNTER — Emergency Department (HOSPITAL_BASED_OUTPATIENT_CLINIC_OR_DEPARTMENT_OTHER): Payer: Medicare Other

## 2022-01-28 ENCOUNTER — Inpatient Hospital Stay (HOSPITAL_BASED_OUTPATIENT_CLINIC_OR_DEPARTMENT_OTHER)
Admission: EM | Admit: 2022-01-28 | Discharge: 2022-02-24 | DRG: 329 | Disposition: A | Discharge status: Hospice | Payer: Medicare Other | Attending: Internal Medicine | Admitting: Internal Medicine

## 2022-01-28 ENCOUNTER — Encounter (HOSPITAL_BASED_OUTPATIENT_CLINIC_OR_DEPARTMENT_OTHER): Payer: Self-pay | Admitting: Emergency Medicine

## 2022-01-28 DIAGNOSIS — Z936 Other artificial openings of urinary tract status: Secondary | ICD-10-CM

## 2022-01-28 DIAGNOSIS — R64 Cachexia: Secondary | ICD-10-CM | POA: Diagnosis not present

## 2022-01-28 DIAGNOSIS — I82412 Acute embolism and thrombosis of left femoral vein: Secondary | ICD-10-CM | POA: Diagnosis not present

## 2022-01-28 DIAGNOSIS — Z515 Encounter for palliative care: Secondary | ICD-10-CM

## 2022-01-28 DIAGNOSIS — Z87442 Personal history of urinary calculi: Secondary | ICD-10-CM

## 2022-01-28 DIAGNOSIS — K567 Ileus, unspecified: Secondary | ICD-10-CM | POA: Diagnosis not present

## 2022-01-28 DIAGNOSIS — Z20822 Contact with and (suspected) exposure to covid-19: Secondary | ICD-10-CM | POA: Diagnosis present

## 2022-01-28 DIAGNOSIS — D75839 Thrombocytosis, unspecified: Secondary | ICD-10-CM | POA: Diagnosis not present

## 2022-01-28 DIAGNOSIS — I129 Hypertensive chronic kidney disease with stage 1 through stage 4 chronic kidney disease, or unspecified chronic kidney disease: Secondary | ICD-10-CM | POA: Diagnosis present

## 2022-01-28 DIAGNOSIS — Z0189 Encounter for other specified special examinations: Secondary | ICD-10-CM

## 2022-01-28 DIAGNOSIS — W19XXXA Unspecified fall, initial encounter: Secondary | ICD-10-CM | POA: Diagnosis not present

## 2022-01-28 DIAGNOSIS — Y838 Other surgical procedures as the cause of abnormal reaction of the patient, or of later complication, without mention of misadventure at the time of the procedure: Secondary | ICD-10-CM | POA: Diagnosis not present

## 2022-01-28 DIAGNOSIS — C782 Secondary malignant neoplasm of pleura: Secondary | ICD-10-CM | POA: Diagnosis not present

## 2022-01-28 DIAGNOSIS — L03818 Cellulitis of other sites: Secondary | ICD-10-CM | POA: Diagnosis not present

## 2022-01-28 DIAGNOSIS — C784 Secondary malignant neoplasm of small intestine: Principal | ICD-10-CM | POA: Diagnosis present

## 2022-01-28 DIAGNOSIS — D72829 Elevated white blood cell count, unspecified: Secondary | ICD-10-CM

## 2022-01-28 DIAGNOSIS — D631 Anemia in chronic kidney disease: Secondary | ICD-10-CM | POA: Diagnosis not present

## 2022-01-28 DIAGNOSIS — R7402 Elevation of levels of lactic acid dehydrogenase (LDH): Secondary | ICD-10-CM | POA: Diagnosis not present

## 2022-01-28 DIAGNOSIS — N1831 Chronic kidney disease, stage 3a: Secondary | ICD-10-CM | POA: Diagnosis not present

## 2022-01-28 DIAGNOSIS — Z7189 Other specified counseling: Secondary | ICD-10-CM

## 2022-01-28 DIAGNOSIS — I700 Atherosclerosis of aorta: Secondary | ICD-10-CM | POA: Diagnosis present

## 2022-01-28 DIAGNOSIS — G9341 Metabolic encephalopathy: Secondary | ICD-10-CM | POA: Diagnosis not present

## 2022-01-28 DIAGNOSIS — Z1621 Resistance to vancomycin: Secondary | ICD-10-CM | POA: Diagnosis present

## 2022-01-28 DIAGNOSIS — C7801 Secondary malignant neoplasm of right lung: Secondary | ICD-10-CM | POA: Diagnosis not present

## 2022-01-28 DIAGNOSIS — C786 Secondary malignant neoplasm of retroperitoneum and peritoneum: Secondary | ICD-10-CM | POA: Diagnosis not present

## 2022-01-28 DIAGNOSIS — T8149XA Infection following a procedure, other surgical site, initial encounter: Secondary | ICD-10-CM | POA: Diagnosis not present

## 2022-01-28 DIAGNOSIS — R54 Age-related physical debility: Secondary | ICD-10-CM | POA: Diagnosis present

## 2022-01-28 DIAGNOSIS — Z4682 Encounter for fitting and adjustment of non-vascular catheter: Secondary | ICD-10-CM

## 2022-01-28 DIAGNOSIS — I10 Essential (primary) hypertension: Secondary | ICD-10-CM | POA: Diagnosis present

## 2022-01-28 DIAGNOSIS — R0602 Shortness of breath: Secondary | ICD-10-CM

## 2022-01-28 DIAGNOSIS — D500 Iron deficiency anemia secondary to blood loss (chronic): Secondary | ICD-10-CM

## 2022-01-28 DIAGNOSIS — K56609 Unspecified intestinal obstruction, unspecified as to partial versus complete obstruction: Secondary | ICD-10-CM

## 2022-01-28 DIAGNOSIS — J910 Malignant pleural effusion: Secondary | ICD-10-CM | POA: Diagnosis not present

## 2022-01-28 DIAGNOSIS — N136 Pyonephrosis: Secondary | ICD-10-CM | POA: Diagnosis not present

## 2022-01-28 DIAGNOSIS — Z9049 Acquired absence of other specified parts of digestive tract: Secondary | ICD-10-CM

## 2022-01-28 DIAGNOSIS — C7802 Secondary malignant neoplasm of left lung: Secondary | ICD-10-CM | POA: Diagnosis present

## 2022-01-28 DIAGNOSIS — R109 Unspecified abdominal pain: Secondary | ICD-10-CM

## 2022-01-28 DIAGNOSIS — Z903 Acquired absence of stomach [part of]: Secondary | ICD-10-CM

## 2022-01-28 DIAGNOSIS — J948 Other specified pleural conditions: Secondary | ICD-10-CM | POA: Diagnosis not present

## 2022-01-28 DIAGNOSIS — E43 Unspecified severe protein-calorie malnutrition: Secondary | ICD-10-CM | POA: Diagnosis present

## 2022-01-28 DIAGNOSIS — Z8507 Personal history of malignant neoplasm of pancreas: Secondary | ICD-10-CM

## 2022-01-28 DIAGNOSIS — Z9889 Other specified postprocedural states: Secondary | ICD-10-CM

## 2022-01-28 DIAGNOSIS — E876 Hypokalemia: Secondary | ICD-10-CM | POA: Diagnosis not present

## 2022-01-28 DIAGNOSIS — Z8616 Personal history of COVID-19: Secondary | ICD-10-CM

## 2022-01-28 DIAGNOSIS — N179 Acute kidney failure, unspecified: Secondary | ICD-10-CM | POA: Diagnosis not present

## 2022-01-28 DIAGNOSIS — B952 Enterococcus as the cause of diseases classified elsewhere: Secondary | ICD-10-CM | POA: Diagnosis present

## 2022-01-28 DIAGNOSIS — K8689 Other specified diseases of pancreas: Secondary | ICD-10-CM

## 2022-01-28 DIAGNOSIS — R600 Localized edema: Secondary | ICD-10-CM

## 2022-01-28 DIAGNOSIS — R7989 Other specified abnormal findings of blood chemistry: Secondary | ICD-10-CM | POA: Diagnosis not present

## 2022-01-28 DIAGNOSIS — Z9081 Acquired absence of spleen: Secondary | ICD-10-CM

## 2022-01-28 DIAGNOSIS — F05 Delirium due to known physiological condition: Secondary | ICD-10-CM | POA: Diagnosis not present

## 2022-01-28 DIAGNOSIS — Z79899 Other long term (current) drug therapy: Secondary | ICD-10-CM

## 2022-01-28 DIAGNOSIS — L89153 Pressure ulcer of sacral region, stage 3: Secondary | ICD-10-CM | POA: Diagnosis present

## 2022-01-28 DIAGNOSIS — Z90411 Acquired partial absence of pancreas: Secondary | ICD-10-CM

## 2022-01-28 DIAGNOSIS — B9620 Unspecified Escherichia coli [E. coli] as the cause of diseases classified elsewhere: Secondary | ICD-10-CM | POA: Diagnosis present

## 2022-01-28 DIAGNOSIS — N390 Urinary tract infection, site not specified: Secondary | ICD-10-CM

## 2022-01-28 DIAGNOSIS — K56600 Partial intestinal obstruction, unspecified as to cause: Secondary | ICD-10-CM | POA: Diagnosis present

## 2022-01-28 DIAGNOSIS — Z66 Do not resuscitate: Secondary | ICD-10-CM | POA: Diagnosis not present

## 2022-01-28 DIAGNOSIS — R140 Abdominal distension (gaseous): Secondary | ICD-10-CM

## 2022-01-28 DIAGNOSIS — Z823 Family history of stroke: Secondary | ICD-10-CM

## 2022-01-28 DIAGNOSIS — T8143XA Infection following a procedure, organ and space surgical site, initial encounter: Secondary | ICD-10-CM | POA: Diagnosis not present

## 2022-01-28 DIAGNOSIS — M549 Dorsalgia, unspecified: Secondary | ICD-10-CM | POA: Diagnosis not present

## 2022-01-28 DIAGNOSIS — N1330 Unspecified hydronephrosis: Secondary | ICD-10-CM

## 2022-01-28 DIAGNOSIS — Z933 Colostomy status: Secondary | ICD-10-CM

## 2022-01-28 DIAGNOSIS — J90 Pleural effusion, not elsewhere classified: Secondary | ICD-10-CM

## 2022-01-28 DIAGNOSIS — R059 Cough, unspecified: Secondary | ICD-10-CM

## 2022-01-28 DIAGNOSIS — I82452 Acute embolism and thrombosis of left peroneal vein: Secondary | ICD-10-CM | POA: Diagnosis not present

## 2022-01-28 DIAGNOSIS — L8990 Pressure ulcer of unspecified site, unspecified stage: Secondary | ICD-10-CM | POA: Insufficient documentation

## 2022-01-28 DIAGNOSIS — R627 Adult failure to thrive: Secondary | ICD-10-CM | POA: Diagnosis present

## 2022-01-28 DIAGNOSIS — G8929 Other chronic pain: Secondary | ICD-10-CM | POA: Diagnosis present

## 2022-01-28 DIAGNOSIS — I82402 Acute embolism and thrombosis of unspecified deep veins of left lower extremity: Secondary | ICD-10-CM

## 2022-01-28 DIAGNOSIS — K660 Peritoneal adhesions (postprocedural) (postinfection): Secondary | ICD-10-CM | POA: Diagnosis present

## 2022-01-28 DIAGNOSIS — J869 Pyothorax without fistula: Secondary | ICD-10-CM | POA: Diagnosis not present

## 2022-01-28 DIAGNOSIS — C252 Malignant neoplasm of tail of pancreas: Secondary | ICD-10-CM

## 2022-01-28 LAB — CBC WITH DIFFERENTIAL/PLATELET
Abs Immature Granulocytes: 0.05 10*3/uL (ref 0.00–0.07)
Basophils Absolute: 0.1 10*3/uL (ref 0.0–0.1)
Basophils Relative: 0 %
Eosinophils Absolute: 0.1 10*3/uL (ref 0.0–0.5)
Eosinophils Relative: 1 %
HCT: 39.2 % (ref 39.0–52.0)
Hemoglobin: 12.5 g/dL — ABNORMAL LOW (ref 13.0–17.0)
Immature Granulocytes: 0 %
Lymphocytes Relative: 10 %
Lymphs Abs: 1.2 10*3/uL (ref 0.7–4.0)
MCH: 29.8 pg (ref 26.0–34.0)
MCHC: 31.9 g/dL (ref 30.0–36.0)
MCV: 93.3 fL (ref 80.0–100.0)
Monocytes Absolute: 1.1 10*3/uL — ABNORMAL HIGH (ref 0.1–1.0)
Monocytes Relative: 9 %
Neutro Abs: 9.8 10*3/uL — ABNORMAL HIGH (ref 1.7–7.7)
Neutrophils Relative %: 80 %
Platelets: 635 10*3/uL — ABNORMAL HIGH (ref 150–400)
RBC: 4.2 MIL/uL — ABNORMAL LOW (ref 4.22–5.81)
RDW: 17.4 % — ABNORMAL HIGH (ref 11.5–15.5)
WBC: 12.3 10*3/uL — ABNORMAL HIGH (ref 4.0–10.5)
nRBC: 0 % (ref 0.0–0.2)

## 2022-01-28 LAB — COMPREHENSIVE METABOLIC PANEL
ALT: 31 U/L (ref 0–44)
AST: 53 U/L — ABNORMAL HIGH (ref 15–41)
Albumin: 2.8 g/dL — ABNORMAL LOW (ref 3.5–5.0)
Alkaline Phosphatase: 74 U/L (ref 38–126)
Anion gap: 10 (ref 5–15)
BUN: 41 mg/dL — ABNORMAL HIGH (ref 8–23)
CO2: 23 mmol/L (ref 22–32)
Calcium: 8.6 mg/dL — ABNORMAL LOW (ref 8.9–10.3)
Chloride: 102 mmol/L (ref 98–111)
Creatinine, Ser: 1.5 mg/dL — ABNORMAL HIGH (ref 0.61–1.24)
GFR, Estimated: 47 mL/min — ABNORMAL LOW (ref >60)
Glucose, Bld: 89 mg/dL (ref 70–99)
Potassium: 4.5 mmol/L (ref 3.5–5.1)
Sodium: 135 mmol/L (ref 135–145)
Total Bilirubin: 0.7 mg/dL (ref 0.3–1.2)
Total Protein: 7 g/dL (ref 6.5–8.1)

## 2022-01-28 LAB — URINALYSIS, MICROSCOPIC (REFLEX)

## 2022-01-28 LAB — LIPASE, BLOOD: Lipase: 42 U/L (ref 11–51)

## 2022-01-28 LAB — URINALYSIS, ROUTINE W REFLEX MICROSCOPIC
Bilirubin Urine: NEGATIVE
Glucose, UA: NEGATIVE mg/dL
Ketones, ur: NEGATIVE mg/dL
Nitrite: POSITIVE — AB
Protein, ur: 30 mg/dL — AB
Specific Gravity, Urine: >=1.03 (ref 1.005–1.030)
pH: 5.5 (ref 5.0–8.0)

## 2022-01-28 MED ORDER — MORPHINE SULFATE (PF) 4 MG/ML IV SOLN
4.0000 mg | Freq: Once | INTRAVENOUS | Status: AC
  Administered 2022-01-28: 4 mg via INTRAVENOUS
  Filled 2022-01-28: qty 1

## 2022-01-28 MED ORDER — SODIUM CHLORIDE 0.9 % IV SOLN: INTRAVENOUS | Status: DC | PRN

## 2022-01-28 MED ORDER — SODIUM CHLORIDE 0.9 % IV BOLUS
1000.0000 mL | Freq: Once | INTRAVENOUS | Status: AC
  Administered 2022-01-28: 1000 mL via INTRAVENOUS

## 2022-01-28 MED ORDER — ONDANSETRON HCL 4 MG/2ML IJ SOLN
4.0000 mg | Freq: Once | INTRAMUSCULAR | Status: AC
  Administered 2022-01-28: 4 mg via INTRAVENOUS
  Filled 2022-01-28: qty 2

## 2022-01-28 MED ORDER — SODIUM CHLORIDE 0.9 % IV SOLN
1.0000 g | Freq: Once | INTRAVENOUS | Status: AC
  Administered 2022-01-28: 1 g via INTRAVENOUS
  Filled 2022-01-28: qty 10

## 2022-01-28 MED ORDER — ACETAMINOPHEN 325 MG PO TABS
650.0000 mg | ORAL_TABLET | Freq: Once | ORAL | Status: AC
  Administered 2022-01-28: 650 mg via ORAL
  Filled 2022-01-28: qty 2

## 2022-01-28 NOTE — Progress Notes (Signed)
Patient calling stating that he is in "horrible shape" and needs to speak to Dr Marin Olp. Attempted to get specifics on what he was experiencing. He stated he was having issues with pain, decreased intake and being "worn out" but needs to speak to Dr Marin Olp about his status and what his recommendations are.   Notified patient that Dr Marin Olp had a full schedule today, but that I would deliver his message. Dr Marin Olp notified of request.  Oncology Nurse Navigator Documentation  Oncology Nurse Navigator Flowsheets 01/28/2022  Navigator Follow Up Date: 01/31/2022  Navigator Follow Up Reason: Follow-up Appointment  Navigator Location [facility]  Navigator Encounter Type Telephone  Telephone Incoming Call;Patient Update  Patient Visit Type MedOnc  Treatment Phase Active Tx  Barriers/Navigation Needs Coordination of Care;Education  Education -  Interventions Psycho-Social Support  Acuity Level 2-Minimal Needs (1-2 Barriers Identified)  Coordination of Care -  Education Method -  Support Groups/Services Friends and Family  Time Spent with Patient 15

## 2022-01-28 NOTE — Progress Notes (Signed)
Report received from Buddy Duty, RN at [facility]. They are awaiting transport for patient.

## 2022-01-28 NOTE — ED Provider Notes (Signed)
[facility] EMERGENCY DEPARTMENT Provider Note   CSN: [id number] Arrival date & time: 01/28/22  1327     History  Chief Complaint  Patient presents with   Abdominal Pain    Kevin Villegas is a 81 y.o. male.  HPI  81 year old male with a history of pancreatic cancer, obstructive nephropathy s/p left nephrostomy tube placement, colectomy s/p colostomy, recurrent bowel obstruction, who presents to the emergency department today for evaluation of abdominal pain.  States that he has had abdominal pain for the last 2 to 3 days.  Last night the pain was more severe and was rated at an 8/10.  Pain is located diffusely to the abdomen.  Reports associated nausea and vomiting with this which he states he has never had before.  He is still having some stool output in his colostomy.  He denies any fevers or chills.  Home Medications Prior to Admission medications   Medication Sig Start Date End Date Taking? Authorizing Provider  acetaminophen (TYLENOL) 325 MG tablet Take 650 mg by mouth every 6 (six) hours as needed for mild pain, fever or headache.    [provider]  mirtazapine (REMERON) 15 MG tablet Take 1 tablet (15 mg total) by mouth at bedtime. 12/31/21   Dettinger, [name], MD  polyethylene glycol (MIRALAX / GLYCOLAX) 17 g packet Take 17 g by mouth 2 (two) times daily. Titrate down to once daily if developing diarrhea. 01/10/22   Mariel Aloe, MD      Allergies    Pollen extract    Review of Systems   Review of Systems See HPI for pertinent positives or negatives.   Physical Exam Updated Vital Signs BP 116/87    Pulse 69    Temp 98 F (36.7 C) (Oral)    Resp 18    Ht 5\' 11"  (1.803 m)    Wt 56.7 kg    SpO2 95%    BMI 17.43 kg/m  Physical Exam Vitals and nursing note reviewed.  Constitutional:      General: He is not in acute distress.    Comments: cachectic  HENT:     Head: Normocephalic and atraumatic.  Eyes:     Conjunctiva/sclera: Conjunctivae  normal.  Cardiovascular:     Rate and Rhythm: Normal rate and regular rhythm.     Heart sounds: Normal heart sounds. No murmur heard. Pulmonary:     Effort: Pulmonary effort is normal. No respiratory distress.     Breath sounds: Normal breath sounds. No wheezing.  Abdominal:     General: Bowel sounds are increased.     Palpations: Abdomen is soft.     Tenderness: There is abdominal tenderness (mild diffuse ttp).     Comments: Colostomy to the left mid abdomen  Musculoskeletal:        General: No swelling.     Cervical back: Neck supple.  Skin:    General: Skin is warm and dry.     Capillary Refill: Capillary refill takes less than 2 seconds.  Neurological:     Mental Status: He is alert.  Psychiatric:        Mood and Affect: Mood normal.     ED Results / Procedures / Treatments   Labs (all labs ordered are listed, but only abnormal results are displayed) Labs Reviewed  CBC WITH DIFFERENTIAL/PLATELET - Abnormal; Notable for the following components:      Result Value   WBC 12.3 (*)    RBC 4.20 (*)  Hemoglobin 12.5 (*)    RDW 17.4 (*)    Platelets 635 (*)    Neutro Abs 9.8 (*)    Monocytes Absolute 1.1 (*)    All other components within normal limits  COMPREHENSIVE METABOLIC PANEL - Abnormal; Notable for the following components:   BUN 41 (*)    Creatinine, Ser 1.50 (*)    Calcium 8.6 (*)    Albumin 2.8 (*)    AST 53 (*)    GFR, Estimated 47 (*)    All other components within normal limits  URINALYSIS, ROUTINE W REFLEX MICROSCOPIC - Abnormal; Notable for the following components:   APPearance CLOUDY (*)    Hgb urine dipstick SMALL (*)    Protein, ur 30 (*)    Nitrite POSITIVE (*)    Leukocytes,Ua SMALL (*)    All other components within normal limits  URINALYSIS, MICROSCOPIC (REFLEX) - Abnormal; Notable for the following components:   Bacteria, UA MANY (*)    All other components within normal limits  URINE CULTURE  LIPASE, BLOOD     EKG None  Radiology CT ABDOMEN PELVIS WO CONTRAST  Result Date: 01/28/2022 CLINICAL DATA:  Abdominal pain. Suspicion of bowel obstruction. History of colostomy EXAM: CT ABDOMEN AND PELVIS WITHOUT CONTRAST TECHNIQUE: Multidetector CT imaging of the abdomen and pelvis was performed following the standard protocol without IV contrast. RADIATION DOSE REDUCTION: This exam was performed according to the departmental dose-optimization program which includes automated exposure control, adjustment of the mA and/or kV according to patient size and/or use of iterative reconstruction technique. COMPARISON:  01/07/2022 FINDINGS: Lower chest: Bilateral pulmonary nodules consistent with metastasis. Index right lower lobe 2.2 cm nodule on 12/04 is similar to on the prior. Lingular nodule measures 1.2 cm on 12/04. Loculated left-sided pleural effusion with pleural thickening, similar. Hepatobiliary: Hepatic cysts. Cholecystectomy, without biliary ductal dilatation. Pancreas: Per report partial pancreatectomy. Pancreas poorly evaluated secondary to technique and paucity of retroperitoneal fat. Spleen: Surgically absent Adrenals/Urinary Tract: Normal adrenal glands. Left ureteric stent and nephrostomy catheter in place. Marked right renal atrophy with chronic hydroureteronephrosis. This is followed to an area of persistent distal right ureteric hyperattenuation including on 77/2. Stomach/Bowel: The stomach is decompressed, appears mildly thick walled. A duodenal stent is similar in position, without upstream dilatation. Hartmann's pouch. The remaining sigmoid is thick walled. Descending colostomy. Proximal and mid small bowel loops are fluid-filled and dilated including at up to 4.3 cm on 47/2. 4.9 cm at the same level on the prior. Transition point in the left upper pelvis again suspected, including on 67/2. Distal small bowel loops are decompressed. Normal appendix. Vascular/Lymphatic: Aortic atherosclerosis. No gross  abdominopelvic adenopathy. Reproductive: Mild prostatomegaly. Other: No significant free fluid. No free intraperitoneal air. Right abdominal wall subcutaneous 2.6 cm nodule on 46/2 is similar to on the prior. Suspect developing left chest wall/intercostal metastasis, as evidenced by soft tissue fullness on 01/02. Musculoskeletal: Bilateral hip osteoarthritis. Lumbosacral spondylosis. IMPRESSION: 1. Degraded exam, secondary to lack of oral or IV contrast. 2. Since 01/07/2022, redemonstration of high-grade partial small-bowel obstruction with transition point in the upper left pelvis psych: likely due to an adhesion. 3. Pulmonary metastasis with loculated left-sided pleural effusion and probable anterior left chest wall intercostal metastasis. 4. Right renal atrophy and hydroureteronephrosis. Possible distal right ureteric stones versus retained contrast. 5. Similar right abdominal wall soft tissue nodule, suspicious for metastatic disease. Electronically Signed   By: Abigail Miyamoto M.D.   On: 01/28/2022 18:05    Procedures Procedures  Medications Ordered in ED Medications  cefTRIAXone (ROCEPHIN) 1 g in sodium chloride 0.9 % 100 mL IVPB (1 g Intravenous New Bag/Given 01/28/22 1851)  0.9 %  sodium chloride infusion ( Intravenous New Bag/Given 01/28/22 1850)  acetaminophen (TYLENOL) tablet 650 mg (650 mg Oral Given 01/28/22 1532)  ondansetron (ZOFRAN) injection 4 mg (4 mg Intravenous Given 01/28/22 1503)  morphine (PF) 4 MG/ML injection 4 mg (4 mg Intravenous Given 01/28/22 1503)  sodium chloride 0.9 % bolus 1,000 mL ( Intravenous Stopped 01/28/22 1725)    ED Course/ Medical Decision Making/ A&P                           Medical Decision Making Amount and/or Complexity of Data Reviewed Labs: ordered. Radiology: ordered.  Risk OTC drugs. Prescription drug management.   This patient presents to the ED for concern of abd pain, nv, this involves an extensive number of treatment options, and is a  complaint that carries with it a high risk of complications and morbidity.  The differential diagnosis includes bowel obstruction, pancreatitis, nephrolithiasis, pyelonephritis, cholecystitis, bowel perf, ileus   Comorbidities that complicate the patient evaluation: Patients presentation is complicated by their history of pancreatic ca, recurrent bowel obstruction, colonic resection  Additional history obtained: Additional history obtained from spouse Records reviewed previous admission documents, Care Everywhere/External Records, and Primary Care Documents  Lab Tests: I Ordered, and personally interpreted labs.  The pertinent results include:  CBC with mild anemia and mild leukocytosis CMP with mildly elevated bun/cr which appears consistent with baseline, ast slightly elevated, otherwise reassuring Lipase negative UA with nitrites, leuks, wbc and bacteria consistent with uti. Ceftriaxone given. Culture sent.  Imaging Studies ordered: I ordered imaging studies including CT scan     I independently visualized and interpreted imaging which showed  CT abd/pelvis 1. Degraded exam, secondary to lack of oral or IV contrast. 2. Since 01/07/2022, redemonstration of high-grade partial small-bowel obstruction with transition point in the upper left pelvis psych: likely due to an adhesion. 3. Pulmonary metastasis with loculated left-sided pleural effusion and probable anterior left chest wall intercostal metastasis. 4. Right renal atrophy and hydroureteronephrosis. Possible distal right ureteric stones versus retained contrast. 5. Similar right abdominal wall soft tissue nodule, suspicious for metastatic disease. I agree with the radiologist interpretation   Medicines ordered and prescription drug management: I ordered medication including morphine, zofran, tylenol  for pain and nausea  Reevaluation of the patient after these medicines showed that the patient    improved  Critical  Interventions:  NG tube, antiemetics, analgesics  Consultations Obtained: I requested consultation with the consultant Dr. Donne Hazel with general surgery at 6:25 PM , and discussed  findings as well as pertinent plan - they recommend: NG tube and admission to medicine.   Reevaluation: After the interventions noted above, I reevaluated the patient and found that they have :improved  Complexity of problems addressed: Patients presentation is most consistent with  acute presentation with potential threat to life or bodily function  Disposition: After consideration of the diagnostic results and the patients response to treatment,  I feel that the patent would benefit from admission to hospitalist service .   At shift change, pending admission. Care transitioned to [facility] pending admission   Final Clinical Impression(s) / ED Diagnoses Final diagnoses:  Intestinal obstruction, unspecified cause, unspecified whether partial or complete [facility])    Rx / DC Orders ED Discharge Orders     None  Rodney Booze, PA-C 01/28/22 [place], [place], DO 01/31/22 1558

## 2022-01-28 NOTE — ED Provider Notes (Signed)
Discussed with hospitalist Dr. Trilby Drummer who agrees to accept patient in transfer for admission   Nettie Elm, PA-C 01/28/22 2014    Lucrezia Starch, MD 01/31/22 [phone]

## 2022-01-28 NOTE — ED Triage Notes (Signed)
States believes he has a bowel obstruction , last BM  was today , has  colostomy , having abd pain since last night and is not hungry

## 2022-01-29 DIAGNOSIS — F05 Delirium due to known physiological condition: Secondary | ICD-10-CM | POA: Diagnosis not present

## 2022-01-29 DIAGNOSIS — Y838 Other surgical procedures as the cause of abnormal reaction of the patient, or of later complication, without mention of misadventure at the time of the procedure: Secondary | ICD-10-CM | POA: Diagnosis not present

## 2022-01-29 DIAGNOSIS — C7801 Secondary malignant neoplasm of right lung: Secondary | ICD-10-CM | POA: Diagnosis present

## 2022-01-29 DIAGNOSIS — Z7189 Other specified counseling: Secondary | ICD-10-CM | POA: Diagnosis not present

## 2022-01-29 DIAGNOSIS — G9341 Metabolic encephalopathy: Secondary | ICD-10-CM | POA: Diagnosis not present

## 2022-01-29 DIAGNOSIS — W19XXXA Unspecified fall, initial encounter: Secondary | ICD-10-CM | POA: Diagnosis not present

## 2022-01-29 DIAGNOSIS — Z933 Colostomy status: Secondary | ICD-10-CM | POA: Diagnosis not present

## 2022-01-29 DIAGNOSIS — C252 Malignant neoplasm of tail of pancreas: Secondary | ICD-10-CM

## 2022-01-29 DIAGNOSIS — I10 Essential (primary) hypertension: Secondary | ICD-10-CM

## 2022-01-29 DIAGNOSIS — G893 Neoplasm related pain (acute) (chronic): Secondary | ICD-10-CM | POA: Diagnosis not present

## 2022-01-29 DIAGNOSIS — Z8616 Personal history of COVID-19: Secondary | ICD-10-CM | POA: Diagnosis not present

## 2022-01-29 DIAGNOSIS — K56609 Unspecified intestinal obstruction, unspecified as to partial versus complete obstruction: Secondary | ICD-10-CM | POA: Diagnosis not present

## 2022-01-29 DIAGNOSIS — K56600 Partial intestinal obstruction, unspecified as to cause: Secondary | ICD-10-CM | POA: Diagnosis not present

## 2022-01-29 DIAGNOSIS — R109 Unspecified abdominal pain: Secondary | ICD-10-CM | POA: Diagnosis not present

## 2022-01-29 DIAGNOSIS — J869 Pyothorax without fistula: Secondary | ICD-10-CM | POA: Diagnosis not present

## 2022-01-29 DIAGNOSIS — D631 Anemia in chronic kidney disease: Secondary | ICD-10-CM | POA: Diagnosis present

## 2022-01-29 DIAGNOSIS — N183 Chronic kidney disease, stage 3 unspecified: Secondary | ICD-10-CM | POA: Diagnosis not present

## 2022-01-29 DIAGNOSIS — N179 Acute kidney failure, unspecified: Secondary | ICD-10-CM | POA: Diagnosis not present

## 2022-01-29 DIAGNOSIS — Z66 Do not resuscitate: Secondary | ICD-10-CM | POA: Diagnosis not present

## 2022-01-29 DIAGNOSIS — L89153 Pressure ulcer of sacral region, stage 3: Secondary | ICD-10-CM | POA: Diagnosis present

## 2022-01-29 DIAGNOSIS — C7802 Secondary malignant neoplasm of left lung: Secondary | ICD-10-CM | POA: Diagnosis present

## 2022-01-29 DIAGNOSIS — C782 Secondary malignant neoplasm of pleura: Secondary | ICD-10-CM | POA: Diagnosis present

## 2022-01-29 DIAGNOSIS — I700 Atherosclerosis of aorta: Secondary | ICD-10-CM | POA: Diagnosis present

## 2022-01-29 DIAGNOSIS — R64 Cachexia: Secondary | ICD-10-CM | POA: Diagnosis not present

## 2022-01-29 DIAGNOSIS — N1330 Unspecified hydronephrosis: Secondary | ICD-10-CM | POA: Diagnosis not present

## 2022-01-29 DIAGNOSIS — I82403 Acute embolism and thrombosis of unspecified deep veins of lower extremity, bilateral: Secondary | ICD-10-CM | POA: Diagnosis not present

## 2022-01-29 DIAGNOSIS — I129 Hypertensive chronic kidney disease with stage 1 through stage 4 chronic kidney disease, or unspecified chronic kidney disease: Secondary | ICD-10-CM | POA: Diagnosis present

## 2022-01-29 DIAGNOSIS — N1831 Chronic kidney disease, stage 3a: Secondary | ICD-10-CM

## 2022-01-29 DIAGNOSIS — J910 Malignant pleural effusion: Secondary | ICD-10-CM | POA: Diagnosis present

## 2022-01-29 DIAGNOSIS — N136 Pyonephrosis: Secondary | ICD-10-CM | POA: Diagnosis present

## 2022-01-29 DIAGNOSIS — C786 Secondary malignant neoplasm of retroperitoneum and peritoneum: Secondary | ICD-10-CM | POA: Diagnosis present

## 2022-01-29 DIAGNOSIS — K565 Intestinal adhesions [bands], unspecified as to partial versus complete obstruction: Secondary | ICD-10-CM | POA: Diagnosis not present

## 2022-01-29 DIAGNOSIS — I82412 Acute embolism and thrombosis of left femoral vein: Secondary | ICD-10-CM | POA: Diagnosis not present

## 2022-01-29 DIAGNOSIS — Z20822 Contact with and (suspected) exposure to covid-19: Secondary | ICD-10-CM | POA: Diagnosis present

## 2022-01-29 DIAGNOSIS — Z8507 Personal history of malignant neoplasm of pancreas: Secondary | ICD-10-CM | POA: Diagnosis not present

## 2022-01-29 DIAGNOSIS — C784 Secondary malignant neoplasm of small intestine: Secondary | ICD-10-CM | POA: Diagnosis present

## 2022-01-29 DIAGNOSIS — J948 Other specified pleural conditions: Secondary | ICD-10-CM | POA: Diagnosis not present

## 2022-01-29 DIAGNOSIS — Z515 Encounter for palliative care: Secondary | ICD-10-CM | POA: Diagnosis not present

## 2022-01-29 DIAGNOSIS — I350 Nonrheumatic aortic (valve) stenosis: Secondary | ICD-10-CM | POA: Diagnosis not present

## 2022-01-29 DIAGNOSIS — R609 Edema, unspecified: Secondary | ICD-10-CM | POA: Diagnosis not present

## 2022-01-29 DIAGNOSIS — E43 Unspecified severe protein-calorie malnutrition: Secondary | ICD-10-CM | POA: Diagnosis present

## 2022-01-29 LAB — CBC
HCT: 38.4 % — ABNORMAL LOW (ref 39.0–52.0)
Hemoglobin: 11.9 g/dL — ABNORMAL LOW (ref 13.0–17.0)
MCH: 29.8 pg (ref 26.0–34.0)
MCHC: 31 g/dL (ref 30.0–36.0)
MCV: 96.2 fL (ref 80.0–100.0)
Platelets: 610 10*3/uL — ABNORMAL HIGH (ref 150–400)
RBC: 3.99 MIL/uL — ABNORMAL LOW (ref 4.22–5.81)
RDW: 17.5 % — ABNORMAL HIGH (ref 11.5–15.5)
WBC: 12.7 10*3/uL — ABNORMAL HIGH (ref 4.0–10.5)
nRBC: 0 % (ref 0.0–0.2)

## 2022-01-29 LAB — COMPREHENSIVE METABOLIC PANEL
ALT: 25 U/L (ref 0–44)
AST: 34 U/L (ref 15–41)
Albumin: 2.7 g/dL — ABNORMAL LOW (ref 3.5–5.0)
Alkaline Phosphatase: 63 U/L (ref 38–126)
Anion gap: 10 (ref 5–15)
BUN: 40 mg/dL — ABNORMAL HIGH (ref 8–23)
CO2: 21 mmol/L — ABNORMAL LOW (ref 22–32)
Calcium: 8.4 mg/dL — ABNORMAL LOW (ref 8.9–10.3)
Chloride: 108 mmol/L (ref 98–111)
Creatinine, Ser: 1.26 mg/dL — ABNORMAL HIGH (ref 0.61–1.24)
GFR, Estimated: 58 mL/min — ABNORMAL LOW (ref >60)
Glucose, Bld: 63 mg/dL — ABNORMAL LOW (ref 70–99)
Potassium: 4.3 mmol/L (ref 3.5–5.1)
Sodium: 139 mmol/L (ref 135–145)
Total Bilirubin: 0.6 mg/dL (ref 0.3–1.2)
Total Protein: 6.3 g/dL — ABNORMAL LOW (ref 6.5–8.1)

## 2022-01-29 LAB — PREALBUMIN: Prealbumin: 8.2 mg/dL — ABNORMAL LOW (ref 18–38)

## 2022-01-29 MED ORDER — MELATONIN 5 MG PO TABS
5.0000 mg | ORAL_TABLET | Freq: Once | ORAL | Status: AC
  Administered 2022-01-29: 5 mg via ORAL
  Filled 2022-01-29: qty 1

## 2022-01-29 MED ORDER — ACETAMINOPHEN 650 MG RE SUPP: 650.0000 mg | Freq: Four times a day (QID) | RECTAL | Status: DC | PRN

## 2022-01-29 MED ORDER — SODIUM CHLORIDE 0.9% FLUSH
3.0000 mL | Freq: Two times a day (BID) | INTRAVENOUS | Status: DC
  Administered 2022-01-29 – 2022-02-19 (×27): 3 mL via INTRAVENOUS

## 2022-01-29 MED ORDER — SODIUM CHLORIDE 0.9 % IV SOLN
1.0000 g | INTRAVENOUS | Status: DC
  Administered 2022-01-30 – 2022-01-31 (×2): 1 g via INTRAVENOUS
  Filled 2022-01-29 (×4): qty 10

## 2022-01-29 MED ORDER — CHLORHEXIDINE GLUCONATE CLOTH 2 % EX PADS
6.0000 | MEDICATED_PAD | Freq: Every day | CUTANEOUS | Status: DC
  Administered 2022-01-29 – 2022-02-20 (×22): 6 via TOPICAL

## 2022-01-29 MED ORDER — ACETAMINOPHEN 325 MG PO TABS
650.0000 mg | ORAL_TABLET | Freq: Four times a day (QID) | ORAL | Status: DC | PRN
  Administered 2022-01-29 – 2022-01-31 (×3): 650 mg via ORAL
  Filled 2022-01-29 (×3): qty 2

## 2022-01-29 MED ORDER — SODIUM CHLORIDE 0.9 % IV SOLN: INTRAVENOUS | Status: AC

## 2022-01-29 NOTE — Consult Note (Signed)
Reason for Consult:psbo Referring Physician: Dr Levan Hurst is an 81 y.o. male.  HPI:81 year old male with a history of pancreatic cancer involving his distal pancreas status post distal pancreatectomy in 2018 at [facility]. He also states that he had portion of his stomach removed during that surgery. Spleen also removed by ct scan.    He has recurrent disease verified by CT scan and PET scanning.  He has biopsy of abd wall nodule that shows metastatic disease as well. He has had a couple psbos treated nonoperatively recently. Of note he had a diverting colostomy in June 2022 with Dr. Johnathan Hausen for distal colonic obstruction.  Unclear exactly what was found since the op note does not describe what the obstruction was from.  In any event, he is diverted since he had a distal colonic obstruction and a stent to that.  Of note he has evidence of metastatic disease to his bladder from recent cystoscopy in December 2022.  He is no longer taking chemotherapy.  He has intermittent bouts of abdominal pain distention and constipation.  some nausea. He denies vomiting though.  CT scan was performed and read as a partial small bowel obstruction.  He has no distention. He is passing some output in colostomy.   PET scan shows metastatic disease involving the lungs and pleural space.  This is confirmed by ct scan. There is also uptake in the abdomen consistent with recurrent cancer in the cancer bed.  Overall, he is done quite well given the complex nature of his condition.  He has multiple issues with renal failure and obstruction of his kidneys requiring percutaneous nephrostomy tubes.  I was asked to see him.     Past Medical History:  Diagnosis Date   Colostomy present [facility])    Large bowel obstruction ([place])    Pancreatic cancer [facility])     Past Surgical History:  Procedure Laterality Date   ABDOMINAL SURGERY     BIOPSY  05/20/2021   Procedure: BIOPSY;  Surgeon: Otis Brace, MD;  Location: [facility]  ENDOSCOPY;  Service: Gastroenterology;;   BOWEL RESECTION     BOWEL RESECTION     CHOLECYSTECTOMY N/A 12/16/2014   Procedure: LAPAROSCOPIC CHOLECYSTECTOMY WITH INTRAOPERATIVE CHOLANGIOGRAM;  Surgeon: Georganna Skeans, MD;  Location: [place];  Service: General;  Laterality: N/A;   COLONIC STENT PLACEMENT N/A 05/26/2021   Procedure: COLONIC STENT PLACEMENT;  Surgeon: Clarene Essex, MD;  Location: [facility] ENDOSCOPY;  Service: Endoscopy;  Laterality: N/A;   CYSTOSCOPY W/ URETERAL STENT PLACEMENT Left 10/27/2021   Procedure: CYSTOSCOPY WITH RETROGRADE PYELOGRAM/URETERAL STENT PLACEMENT;  Surgeon: Raynelle Bring, MD;  Location: [facility];  Service: Urology;  Laterality: Left;   ESOPHAGOGASTRODUODENOSCOPY N/A 05/20/2021   Procedure: ESOPHAGOGASTRODUODENOSCOPY (EGD);  Surgeon: Otis Brace, MD;  Location: [facility];  Service: Gastroenterology;  Laterality: N/A;   EUS N/A 09/24/2015   Procedure: UPPER ENDOSCOPIC ULTRASOUND (EUS) LINEAR;  Surgeon: Milus Banister, MD;  Location: [facility];  Service: Endoscopy;  Laterality: N/A;   FLEXIBLE SIGMOIDOSCOPY N/A 05/20/2021   Procedure: FLEXIBLE SIGMOIDOSCOPY;  Surgeon: Otis Brace, MD;  Location[facility];  Service: Gastroenterology;  Laterality: N/A;   FLEXIBLE SIGMOIDOSCOPY N/A 05/26/2021   Procedure: FLEXIBLE SIGMOIDOSCOPY;  Surgeon: Clarene Essex, MD;  Location[facility];  Service: Endoscopy;  Laterality: N/A;   IR NEPHROSTOMY EXCHANGE LEFT  12/23/2021   IR NEPHROSTOMY EXCHANGE LEFT  01/17/2022   IR NEPHROSTOMY PLACEMENT LEFT  11/11/2021   IR NEPHROSTOMY PLACEMENT RIGHT  11/11/2021   LAPAROSCOPIC DISTAL PANCREATECTOMY  10/2017   [place] PARTIAL GASTRECTOMY  10/2017   [facility]   LAPAROSCOPIC SIGMOID COLECTOMY N/A 06/14/2021   Procedure: LAPAROSCOPY WITH MOBILIZATION, PROXIMAL DIVERTING COLOSTOMY, CREATION OF DISTAL MUCOUS FISTULA;  Surgeon: Johnathan Hausen, MD;  Location: [facility];  Service: General;  Laterality: N/A;   LAPAROSCOPIC  SPLENECTOMY  10/2017   [facility]   VASECTOMY      Family History  Problem Relation Age of Onset   Stroke Mother     Social History:  reports that he has never smoked. He has never used smokeless tobacco. He reports that he does not drink alcohol and does not use drugs.  Allergies:  Allergies  Allergen Reactions   Pollen Extract Other (See Comments)    Sinus congestion    Medications: I have reviewed the patient's current medications.  Results for orders placed or performed during the hospital encounter of 01/28/22 (from the past 48 hour(s))  CBC with Differential     Status: Abnormal   Collection Time: 01/28/22  3:00 PM  Result Value Ref Range   WBC 12.3 (H) 4.0 - 10.5 K/uL   RBC 4.20 (L) 4.22 - 5.81 MIL/uL   Hemoglobin 12.5 (L) 13.0 - 17.0 g/dL   HCT 39.2 39.0 - 52.0 %   MCV 93.3 80.0 - 100.0 fL   MCH 29.8 26.0 - 34.0 pg   MCHC 31.9 30.0 - 36.0 g/dL   RDW 17.4 (H) 11.5 - 15.5 %   Platelets 635 (H) 150 - 400 K/uL   nRBC 0.0 0.0 - 0.2 %   Neutrophils Relative % 80 %   Neutro Abs 9.8 (H) 1.7 - 7.7 K/uL   Lymphocytes Relative 10 %   Lymphs Abs 1.2 0.7 - 4.0 K/uL   Monocytes Relative 9 %   Monocytes Absolute 1.1 (H) 0.1 - 1.0 K/uL   Eosinophils Relative 1 %   Eosinophils Absolute 0.1 0.0 - 0.5 K/uL   Basophils Relative 0 %   Basophils Absolute 0.1 0.0 - 0.1 K/uL   Immature Granulocytes 0 %   Abs Immature Granulocytes 0.05 0.00 - 0.07 K/uL    Comment: Performed at [facility], [address] [place], [place] [address]  Comprehensive metabolic panel     Status: Abnormal   Collection Time: 01/28/22  3:00 PM  Result Value Ref Range   Sodium 135 135 - 145 mmol/L   Potassium 4.5 3.5 - 5.1 mmol/L   Chloride 102 98 - 111 mmol/L   CO2 23 22 - 32 mmol/L   Glucose, Bld 89 70 - 99 mg/dL    Comment: Glucose reference range applies only to samples taken after fasting for at least 8 hours.   BUN 41 (H) 8 - 23 mg/dL   Creatinine, Ser 1.50 (H) 0.61 - 1.24 mg/dL   Calcium  8.6 (L) 8.9 - 10.3 mg/dL   Total Protein 7.0 6.5 - 8.1 g/dL   Albumin 2.8 (L) 3.5 - 5.0 g/dL   AST 53 (H) 15 - 41 U/L   ALT 31 0 - 44 U/L   Alkaline Phosphatase 74 38 - 126 U/L   Total Bilirubin 0.7 0.3 - 1.2 mg/dL   GFR, Estimated 47 (L) >60 mL/min    Comment: (NOTE) Calculated using the CKD-EPI Creatinine Equation (2021)    Anion gap 10 5 - 15    Comment: Performed at [facility], [address] [place], [place] [address]  Lipase, blood     Status:  None   Collection Time: 01/28/22  3:00 PM  Result Value Ref Range   Lipase 42 11 - 51 U/L    Comment: Performed at [facility], [address] [place], [place] [address]  Urinalysis, Routine w reflex microscopic     Status: Abnormal   Collection Time: 01/28/22  3:00 PM  Result Value Ref Range   Color, Urine YELLOW YELLOW   APPearance CLOUDY (A) CLEAR   Specific Gravity, Urine >=1.030 1.005 - 1.030   pH 5.5 5.0 - 8.0   Glucose, UA NEGATIVE NEGATIVE mg/dL   Hgb urine dipstick SMALL (A) NEGATIVE   Bilirubin Urine NEGATIVE NEGATIVE   Ketones, ur NEGATIVE NEGATIVE mg/dL   Protein, ur 30 (A) NEGATIVE mg/dL   Nitrite POSITIVE (A) NEGATIVE   Leukocytes,Ua SMALL (A) NEGATIVE    Comment: Performed at [facility], [address] [place], [place] [address]  Urinalysis, Microscopic (reflex)     Status: Abnormal   Collection Time: 01/28/22  3:00 PM  Result Value Ref Range   RBC / HPF 11-20 0 - 5 RBC/hpf   WBC, UA 11-20 0 - 5 WBC/hpf   Bacteria, UA MANY (A) NONE SEEN   Squamous Epithelial / LPF 6-10 0 - 5   Hyaline Casts, UA PRESENT     Comment: Performed at [facility], [address] [place], [place] [address]  Comprehensive metabolic panel     Status: Abnormal   Collection Time: 01/29/22  4:46 AM  Result Value Ref Range   Sodium 139 135 - 145 mmol/L   Potassium 4.3 3.5 - 5.1 mmol/L   Chloride 108 98 - 111 mmol/L   CO2 21 (L) 22 - 32 mmol/L   Glucose, Bld 63 (L) 70 - 99 mg/dL     Comment: Glucose reference range applies only to samples taken after fasting for at least 8 hours.   BUN 40 (H) 8 - 23 mg/dL   Creatinine, Ser 1.26 (H) 0.61 - 1.24 mg/dL   Calcium 8.4 (L) 8.9 - 10.3 mg/dL   Total Protein 6.3 (L) 6.5 - 8.1 g/dL   Albumin 2.7 (L) 3.5 - 5.0 g/dL   AST 34 15 - 41 U/L   ALT 25 0 - 44 U/L   Alkaline Phosphatase 63 38 - 126 U/L   Total Bilirubin 0.6 0.3 - 1.2 mg/dL   GFR, Estimated 58 (L) >60 mL/min    Comment: (NOTE) Calculated using the CKD-EPI Creatinine Equation (2021)    Anion gap 10 5 - 15    Comment: Performed at [facility], [place] [address], [place], [place] [address]  CBC     Status: Abnormal   Collection Time: 01/29/22  4:46 AM  Result Value Ref Range   WBC 12.7 (H) 4.0 - 10.5 K/uL   RBC 3.99 (L) 4.22 - 5.81 MIL/uL   Hemoglobin 11.9 (L) 13.0 - 17.0 g/dL   HCT 38.4 (L) 39.0 - 52.0 %   MCV 96.2 80.0 - 100.0 fL   MCH 29.8 26.0 - 34.0 pg   MCHC 31.0 30.0 - 36.0 g/dL   RDW 17.5 (H) 11.5 - 15.5 %   Platelets 610 (H) 150 - 400 K/uL   nRBC 0.0 0.0 - 0.2 %    Comment: Performed at [facility], [place] [address] [place], [place] [address]    CT ABDOMEN PELVIS WO CONTRAST  Result Date: 01/28/2022 CLINICAL DATA:  Abdominal pain. Suspicion of  bowel obstruction. History of colostomy EXAM: CT ABDOMEN AND PELVIS WITHOUT CONTRAST TECHNIQUE: Multidetector CT imaging of the abdomen and pelvis was performed following the standard protocol without IV contrast. RADIATION DOSE REDUCTION: This exam was performed according to the departmental dose-optimization program which includes automated exposure control, adjustment of the mA and/or kV according to patient size and/or use of iterative reconstruction technique. COMPARISON:  01/07/2022 FINDINGS: Lower chest: Bilateral pulmonary nodules consistent with metastasis. Index right lower lobe 2.2 cm nodule on 12/04 is similar to on the prior. Lingular nodule measures 1.2 cm on 12/04.  Loculated left-sided pleural effusion with pleural thickening, similar. Hepatobiliary: Hepatic cysts. Cholecystectomy, without biliary ductal dilatation. Pancreas: Per report partial pancreatectomy. Pancreas poorly evaluated secondary to technique and paucity of retroperitoneal fat. Spleen: Surgically absent Adrenals/Urinary Tract: Normal adrenal glands. Left ureteric stent and nephrostomy catheter in place. Marked right renal atrophy with chronic hydroureteronephrosis. This is followed to an area of persistent distal right ureteric hyperattenuation including on 77/2. Stomach/Bowel: The stomach is decompressed, appears mildly thick walled. A duodenal stent is similar in position, without upstream dilatation. Hartmann's pouch. The remaining sigmoid is thick walled. Descending colostomy. Proximal and mid small bowel loops are fluid-filled and dilated including at up to 4.3 cm on 47/2. 4.9 cm at the same level on the prior. Transition point in the left upper pelvis again suspected, including on 67/2. Distal small bowel loops are decompressed. Normal appendix. Vascular/Lymphatic: Aortic atherosclerosis. No gross abdominopelvic adenopathy. Reproductive: Mild prostatomegaly. Other: No significant free fluid. No free intraperitoneal air. Right abdominal wall subcutaneous 2.6 cm nodule on 46/2 is similar to on the prior. Suspect developing left chest wall/intercostal metastasis, as evidenced by soft tissue fullness on 01/02. Musculoskeletal: Bilateral hip osteoarthritis. Lumbosacral spondylosis. IMPRESSION: 1. Degraded exam, secondary to lack of oral or IV contrast. 2. Since 01/07/2022, redemonstration of high-grade partial small-bowel obstruction with transition point in the upper left pelvis psych: likely due to an adhesion. 3. Pulmonary metastasis with loculated left-sided pleural effusion and probable anterior left chest wall intercostal metastasis. 4. Right renal atrophy and hydroureteronephrosis. Possible distal right  ureteric stones versus retained contrast. 5. Similar right abdominal wall soft tissue nodule, suspicious for metastatic disease. Electronically Signed   By: Abigail Miyamoto M.D.   On: 01/28/2022 18:05    Review of Systems  Constitutional:  Positive for unexpected weight change.  Gastrointestinal:  Positive for abdominal distention and constipation.  All other systems reviewed and are negative. Blood pressure 137/89, pulse 72, temperature 97.9 F (36.6 C), temperature source Oral, resp. rate 18, height 5\' 11"  (1.803 m), weight 57.7 kg, SpO2 97 %. Physical Exam Constitutional:      Appearance: He is well-developed.  Eyes:     General: No scleral icterus. Pulmonary:     Effort: Pulmonary effort is normal.  Abdominal:     General: There is no distension.     Palpations: Abdomen is soft.     Tenderness: There is no abdominal tenderness.     Comments: Stoma with some output   Neurological:     Mental Status: He is alert.  Psychiatric:        Mood and Affect: Mood normal.        Behavior: Behavior normal.    Assessment/Plan: Stage IV pancreatic cancer with psbo -he does not require surgery at this time -can have liquids today, does not need ng tube -will check prealbumin -need to have oncology see him- there is not a good solution surgically for this  disease and there is certainly risk given prior findings. Some coordinated plan on how to treat him is probably best.  He is currently not undergoing treatment for pancreatic cancer so not sure if doing surgery given old op report is the best option or that it will actually make him better -will also be helpful to discuss with recent operative surgeon with respect to findings as it sounds like that was very difficult and dont know if surgery is really even a reasonable option at this point.   I reviewed ED provider notes, hospitalist notes, last 24 h vitals and pain scores, last 24 h labs and trends, and last 24 h imaging results.  This care  required moderate level of medical decision making.    Rolm Bookbinder 01/29/2022, 8:14 AM

## 2022-01-29 NOTE — H&P (Signed)
History and Physical   Kevin Villegas VZC:[id number] DOB: Nov 25, 1941 DOA: 01/28/2022  PCP: Dettinger, [name], MD   Patient coming from: Home  Chief Complaint: Abdominal pain  HPI: Kevin Villegas is a 81 y.o. male with medical history significant of cancer, status post colectomy, hydronephrosis status post nephrostomy, hypertension, SBO, CKD 3A presents with ongoing abdominal pain.  Patient states he has had 2 to 3 days of increased abdominal pain.  Pain 8 out of 10 today.  He is concerned for issues with his known small bowel obstruction as he has a history of the same.  He states he does have some output in his ostomy still.  Has had some nausea and vomiting.  He has histories of abdominal surgery including pancreatic cancer surgery, placement of stent between his stomach and intestines and colostomy placement.  He states he has had issues with a small bowel obstruction for a while.  He states he has decreased appetite in the morning he is able to have a bowl of cereal coffee and MiraLAX but has very little progress today.  He began to have pain later in the day as his body digest the serial and in the evening he has very loud borborygmi.  He denies fevers, chills, chest pain,  ED Course: Vital signs in the ED are stable.  Lab work-up showed CMP with creatinine stable at 1.5, BUN 41, calcium 8.6, albumin 2.8.  CBC with leukocytosis to 12, hemoglobin stable at 12, platelets stable at 630.  Lipase normal.  Urinalysis with hemoglobin, nitrates, leukocytes, bacteria.  Urine cultures pending.  CT showing partial SBO, pulmonary mets and abdominal nodule.  Patient received morphine, ceftriaxone, Zofran, liter fluids in the ED.  General surgery is consulted and recommended NG tube and admission.  Review of Systems: As per HPI otherwise all other systems reviewed and are negative.  Past Medical History:  Diagnosis Date   Colostomy present [facility])    Large bowel obstruction ([place])    Pancreatic cancer  [facility])     Past Surgical History:  Procedure Laterality Date   ABDOMINAL SURGERY     BIOPSY  05/20/2021   Procedure: BIOPSY;  Surgeon: Otis Brace, MD;  Location: [facility];  Service: Gastroenterology;;   BOWEL RESECTION     BOWEL RESECTION     CHOLECYSTECTOMY N/A 12/16/2014   Procedure: LAPAROSCOPIC CHOLECYSTECTOMY WITH INTRAOPERATIVE CHOLANGIOGRAM;  Surgeon: Georganna Skeans, MD;  Location: [facility];  Service: General;  Laterality: N/A;   COLONIC STENT PLACEMENT N/A 05/26/2021   Procedure: COLONIC STENT PLACEMENT;  Surgeon: Clarene Essex, MD;  Location: [facility];  Service: Endoscopy;  Laterality: N/A;   CYSTOSCOPY W/ URETERAL STENT PLACEMENT Left 10/27/2021   Procedure: CYSTOSCOPY WITH RETROGRADE PYELOGRAM/URETERAL STENT PLACEMENT;  Surgeon: Raynelle Bring, MD;  Location: [facility];  Service: Urology;  Laterality: Left;   ESOPHAGOGASTRODUODENOSCOPY N/A 05/20/2021   Procedure: ESOPHAGOGASTRODUODENOSCOPY (EGD);  Surgeon: Otis Brace, MD;  Location: [facility];  Service: Gastroenterology;  Laterality: N/A;   EUS N/A 09/24/2015   Procedure: UPPER ENDOSCOPIC ULTRASOUND (EUS) LINEAR;  Surgeon: Milus Banister, MD;  [facility];  Service: Endoscopy;  Laterality: N/A;   FLEXIBLE SIGMOIDOSCOPY N/A 05/20/2021   Procedure: FLEXIBLE SIGMOIDOSCOPY;  Surgeon: Otis Brace, MD;  [facility];  Service: Gastroenterology;  Laterality: N/A;   FLEXIBLE SIGMOIDOSCOPY N/A 05/26/2021   Procedure: FLEXIBLE SIGMOIDOSCOPY;  Surgeon: Clarene Essex, MD;  [facility];  Service: Endoscopy;  Laterality: N/A;   IR NEPHROSTOMY EXCHANGE LEFT  12/23/2021   IR  NEPHROSTOMY EXCHANGE LEFT  01/17/2022   IR NEPHROSTOMY PLACEMENT LEFT  11/11/2021   IR NEPHROSTOMY PLACEMENT RIGHT  11/11/2021   LAPAROSCOPIC DISTAL PANCREATECTOMY  10/2017   [facility]   LAPAROSCOPIC PARTIAL GASTRECTOMY  10/2017   [facility]   LAPAROSCOPIC SIGMOID COLECTOMY N/A 06/14/2021   Procedure: LAPAROSCOPY WITH  MOBILIZATION, PROXIMAL DIVERTING COLOSTOMY, CREATION OF DISTAL MUCOUS FISTULA;  Surgeon: Johnathan Hausen, MD;  Location: [facility];  Service: General;  Laterality: N/A;   LAPAROSCOPIC SPLENECTOMY  10/2017   [place] History  reports that he has never smoked. He has never used smokeless tobacco. He reports that he does not drink alcohol and does not use drugs.  Allergies  Allergen Reactions   Pollen Extract Other (See Comments)    Sinus congestion    Family History  Problem Relation Age of Onset   Stroke Mother   Reviewed on admission  Prior to Admission medications   Medication Sig Start Date End Date Taking? Authorizing Provider  acetaminophen (TYLENOL) 325 MG tablet Take 650 mg by mouth every 6 (six) hours as needed for mild pain, fever or headache.    [provider]  mirtazapine (REMERON) 15 MG tablet Take 1 tablet (15 mg total) by mouth at bedtime. 12/31/21   Dettinger, [name], MD  polyethylene glycol (MIRALAX / GLYCOLAX) 17 g packet Take 17 g by mouth 2 (two) times daily. Titrate down to once daily if developing diarrhea. 01/10/22   Mariel Aloe, MD    Physical Exam: Vitals:   01/28/22 2130 01/28/22 2200 01/28/22 2341 01/28/22 2342  BP: (!) 128/91 (!) 117/102  131/85  Pulse: 71 69 75 76  Resp: 18  18   Temp:   97.7 F (36.5 C)   TempSrc:   Oral   SpO2: 95% 95% 98% 95%  Weight:      Height:        Physical Exam Constitutional:      General: He is not in acute distress.    Appearance: Normal appearance.  HENT:     Head: Normocephalic and atraumatic.     Mouth/Throat:     Mouth: Mucous membranes are moist.     Pharynx: Oropharynx is clear.  Eyes:     Extraocular Movements: Extraocular movements intact.     Pupils: Pupils are equal, round, and reactive to light.  Cardiovascular:     Rate and Rhythm: Normal rate and regular rhythm.     Pulses: Normal pulses.     Heart sounds: Normal heart sounds.  Pulmonary:     Effort: Pulmonary  effort is normal. No respiratory distress.     Breath sounds: Normal breath sounds.  Abdominal:     General: Bowel sounds are normal. There is no distension.     Palpations: Abdomen is soft.     Tenderness: There is abdominal tenderness.     Comments: Colostomy present Nephrostomy present  Musculoskeletal:        General: No swelling or deformity.  Skin:    General: Skin is warm and dry.  Neurological:     General: No focal deficit present.     Mental Status: Mental status is at baseline.   Labs on Admission: I have personally reviewed following labs and imaging studies  CBC: Recent Labs  Lab 01/28/22 1500  WBC 12.3*  NEUTROABS 9.8*  HGB 12.5*  HCT 39.2  MCV 93.3  PLT 635*    Basic Metabolic Panel: Recent Labs  Lab 01/28/22 1500  NA 135  K 4.5  CL 102  CO2 23  GLUCOSE 89  BUN 41*  CREATININE 1.50*  CALCIUM 8.6*    GFR: Estimated Creatinine Clearance: 31.5 mL/min (A) (by C-G formula based on SCr of 1.5 mg/dL (H)).  Liver Function Tests: Recent Labs  Lab 01/28/22 1500  AST 53*  ALT 31  ALKPHOS 74  BILITOT 0.7  PROT 7.0  ALBUMIN 2.8*    Urine analysis:    Component Value Date/Time   COLORURINE YELLOW 01/28/2022 1500   APPEARANCEUR CLOUDY (A) 01/28/2022 1500   LABSPEC >=1.030 01/28/2022 1500   PHURINE 5.5 01/28/2022 1500   GLUCOSEU NEGATIVE 01/28/2022 1500   HGBUR SMALL (A) 01/28/2022 1500   BILIRUBINUR NEGATIVE 01/28/2022 1500   KETONESUR NEGATIVE 01/28/2022 1500   PROTEINUR 30 (A) 01/28/2022 1500   UROBILINOGEN 0.2 08/23/2015 1555   NITRITE POSITIVE (A) 01/28/2022 1500   LEUKOCYTESUR SMALL (A) 01/28/2022 1500    Radiological Exams on Admission: CT ABDOMEN PELVIS WO CONTRAST  Result Date: 01/28/2022 CLINICAL DATA:  Abdominal pain. Suspicion of bowel obstruction. History of colostomy EXAM: CT ABDOMEN AND PELVIS WITHOUT CONTRAST TECHNIQUE: Multidetector CT imaging of the abdomen and pelvis was performed following the standard protocol without  IV contrast. RADIATION DOSE REDUCTION: This exam was performed according to the departmental dose-optimization program which includes automated exposure control, adjustment of the mA and/or kV according to patient size and/or use of iterative reconstruction technique. COMPARISON:  01/07/2022 FINDINGS: Lower chest: Bilateral pulmonary nodules consistent with metastasis. Index right lower lobe 2.2 cm nodule on 12/04 is similar to on the prior. Lingular nodule measures 1.2 cm on 12/04. Loculated left-sided pleural effusion with pleural thickening, similar. Hepatobiliary: Hepatic cysts. Cholecystectomy, without biliary ductal dilatation. Pancreas: Per report partial pancreatectomy. Pancreas poorly evaluated secondary to technique and paucity of retroperitoneal fat. Spleen: Surgically absent Adrenals/Urinary Tract: Normal adrenal glands. Left ureteric stent and nephrostomy catheter in place. Marked right renal atrophy with chronic hydroureteronephrosis. This is followed to an area of persistent distal right ureteric hyperattenuation including on 77/2. Stomach/Bowel: The stomach is decompressed, appears mildly thick walled. A duodenal stent is similar in position, without upstream dilatation. Hartmann's pouch. The remaining sigmoid is thick walled. Descending colostomy. Proximal and mid small bowel loops are fluid-filled and dilated including at up to 4.3 cm on 47/2. 4.9 cm at the same level on the prior. Transition point in the left upper pelvis again suspected, including on 67/2. Distal small bowel loops are decompressed. Normal appendix. Vascular/Lymphatic: Aortic atherosclerosis. No gross abdominopelvic adenopathy. Reproductive: Mild prostatomegaly. Other: No significant free fluid. No free intraperitoneal air. Right abdominal wall subcutaneous 2.6 cm nodule on 46/2 is similar to on the prior. Suspect developing left chest wall/intercostal metastasis, as evidenced by soft tissue fullness on 01/02. Musculoskeletal:  Bilateral hip osteoarthritis. Lumbosacral spondylosis. IMPRESSION: 1. Degraded exam, secondary to lack of oral or IV contrast. 2. Since 01/07/2022, redemonstration of high-grade partial small-bowel obstruction with transition point in the upper left pelvis psych: likely due to an adhesion. 3. Pulmonary metastasis with loculated left-sided pleural effusion and probable anterior left chest wall intercostal metastasis. 4. Right renal atrophy and hydroureteronephrosis. Possible distal right ureteric stones versus retained contrast. 5. Similar right abdominal wall soft tissue nodule, suspicious for metastatic disease. Electronically Signed   By: Abigail Miyamoto M.D.   On: 01/28/2022 18:05    EKG: Not performed in the ED.  Assessment/Plan Principal Problem:   SBO (small bowel obstruction) (HCC) Active Problems:  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  with metastatic disease   Hydronephrosis of left kidney   Essential hypertension   Colostomy in place [facility])   Stage 3a chronic kidney disease (CKD) ([place])  Small bowel obstruction > Patient presented with abdominal pain for the past couple days he was concern for possible small bowel obstruction as he has had similar episodes in the past. > CT did not demonstrate small bowel obstruction.  General surgery was consulted and recommended NG tube and patient to be admitted. > Does report some continued output from his ostomy. > He has histories of abdominal surgery including pancreatic cancer surgery, placement of stent between his stomach and intestines and colostomy placement.  - Appreciate general surgery recommendations - Monitor on telemetry - N.p.o. except sips with meds and some water per patient request - NG tube has been refused by the patient, no significant nausea or vomiting we will continue to monitor. - IV fluids  CKD 3A > Creatinine stable 1.5 in ED - Avoid nephrotoxic agents - Trend renal function and  electrolytes  Hydronephrosis > Status post left nephrostomy - Noted  Pancreatic cancer > Known metastatic pancreatic cancer.  Follows with oncology, Dr. Marin Olp. - Not currently on outpatient treatment, focus on quality of life we will do chemotherapy if needed.  But currently the thought is that it may reduce his quality of life  Hypertension - Not currently any medication for this  DVT prophylaxis: SCDs Code Status:   Full Family Communication:  None on admission Disposition Plan:   Patient is from:  Home  Anticipated DC to:  Home  Anticipated DC date:  1 to 3 days  Anticipated DC barriers: None  Consults called:  General surgery, consulted by EDP. Admission status:  Observation, telemetry  Severity of Illness: The appropriate patient status for this patient is OBSERVATION. Observation status is judged to be reasonable and necessary in order to provide the required intensity of service to ensure the patient's safety. The patient's presenting symptoms, physical exam findings, and initial radiographic and laboratory data in the context of their medical condition is felt to place them at decreased risk for further clinical deterioration. Furthermore, it is anticipated that the patient will be medically stable for discharge from the hospital within 2 midnights of admission.   Marcelyn Bruins MD [facility]  How to contact the [facility] Attending or Consulting provider [place] or covering provider during after hours [place], for this patient?   Check the care team in [facility] and look for a) attending/consulting [facility] provider listed and b) the [facility] team listed Log into [email or web address] and use [place]'s universal password to access. If you do not have the password, please contact the hospital operator. Locate the [facility] provider you are looking for under [facility] and page to a number that you can be directly reached. If you still have difficulty reaching the provider, please page  the [facility] (Director on Call) for the Hospitalists listed on amion for assistance.  01/29/2022, 12:14 AM

## 2022-01-29 NOTE — Progress Notes (Signed)
Patient refused NG Tube, Dr. Trilby Drummer made aware.

## 2022-01-29 NOTE — Progress Notes (Signed)
°  Progress Note   Patient: Kevin Villegas JQB:[id number] DOB: 10/10/1941 DOA: 01/28/2022     0 DOS: the patient was seen and examined on 01/29/2022   Brief hospital course: 81 y.o. male with medical history significant of stage 4 pancreatic cancer, status post colectomy, hydronephrosis status post nephrostomy, hypertension, SBO, CKD 3A presents with ongoing abdominal pain.  Patient states he has had 2 to 3 days of increased abdominal pain.  Pain 8 out of 10 today.  He is concerned for issues with his known small bowel obstruction as he has a history of the same.  He states he does have some output in his ostomy still.  Has had some nausea and vomiting. Pt was found to have SBO. General Surgery consulted  Assessment and Plan: No notes have been filed under this hospital service. Service: Hospitalist  Small bowel obstruction > Patient presented with abdominal pain for the past couple days he was concern for possible small bowel obstruction as he has had similar episodes in the past. > CT did not demonstrate small bowel obstruction.  General surgery following and initially recommended NG tube, however pt improved and declined NG -Now on clears per General Surgery -Difficult situation given prior surgical history related to stage 4 pancreatic cancer. -Will f/u with General Surgery recs -Have added Dr. Marin Olp to treatment team -Cont analgesia and supportive care -Defer diet changes to Surgery recs   CKD 3A > Creatinine stable 1.5 in ED - Avoid nephrotoxic agents - Cr improved to 1.26 with hydration  Hydronephrosis > Status post left nephrostomy -Cont to follow bmet trends  Stage 4 Pancreatic cancer > Known metastatic pancreatic cancer.  Follows with oncology, Dr. Marin Olp. - Have added Dr. Marin Olp to treatment team   Hypertension - Not currently any medication for this -BP stable and controlled at this time     Subjective: Reports tolerating clears this AM, asking about advancing  to regular soup  Physical Exam: Vitals:   01/28/22 2341 01/28/22 2342 01/29/22 0355 01/29/22 1335  BP: 131/85 131/85 137/89 120/89  Pulse: 75 76 72 71  Resp: 18  18 16   Temp: 97.7 F (36.5 C)  97.9 F (36.6 C) 97.6 F (36.4 C)  TempSrc: Oral  Oral Oral  SpO2: 98% 95% 97% 95%  Weight: 57.7 kg     Height: 5\' 11"  (1.803 m)      General exam: Awake, laying in bed, in nad Respiratory system: Normal respiratory effort, no wheezing Cardiovascular system: regular rate, s1, s2 Gastrointestinal system: Soft, nondistended, positive BS Central nervous system: CN2-12 grossly intact, strength intact Extremities: Perfused, no clubbing Skin: Normal skin turgor, no notable skin lesions seen Psychiatry: Mood normal // no visual hallucinations   Data Reviewed:  Labs reviewed, Cr improved  Family Communication: Pt in room, family not at bedside  Disposition: Status is: Observation The patient will require care spanning > 2 midnights and should be moved to inpatient because: Severity of illness    Planned Discharge Destination: Home      Author: Marylu Lund, MD 01/29/2022 3:53 PM  For on call review [email or web address].

## 2022-01-30 DIAGNOSIS — K56609 Unspecified intestinal obstruction, unspecified as to partial versus complete obstruction: Secondary | ICD-10-CM | POA: Diagnosis not present

## 2022-01-30 DIAGNOSIS — I10 Essential (primary) hypertension: Secondary | ICD-10-CM | POA: Diagnosis not present

## 2022-01-30 LAB — GLUCOSE, CAPILLARY
Glucose-Capillary: 107 mg/dL — ABNORMAL HIGH (ref 70–99)
Glucose-Capillary: 124 mg/dL — ABNORMAL HIGH (ref 70–99)

## 2022-01-30 LAB — CBC
HCT: 36.8 % — ABNORMAL LOW (ref 39.0–52.0)
Hemoglobin: 11.7 g/dL — ABNORMAL LOW (ref 13.0–17.0)
MCH: 30.6 pg (ref 26.0–34.0)
MCHC: 31.8 g/dL (ref 30.0–36.0)
MCV: 96.3 fL (ref 80.0–100.0)
Platelets: 589 10*3/uL — ABNORMAL HIGH (ref 150–400)
RBC: 3.82 MIL/uL — ABNORMAL LOW (ref 4.22–5.81)
RDW: 17.3 % — ABNORMAL HIGH (ref 11.5–15.5)
WBC: 14 10*3/uL — ABNORMAL HIGH (ref 4.0–10.5)
nRBC: 0 % (ref 0.0–0.2)

## 2022-01-30 LAB — COMPREHENSIVE METABOLIC PANEL
ALT: 19 U/L (ref 0–44)
AST: 23 U/L (ref 15–41)
Albumin: 2.5 g/dL — ABNORMAL LOW (ref 3.5–5.0)
Alkaline Phosphatase: 59 U/L (ref 38–126)
Anion gap: 6 (ref 5–15)
BUN: 26 mg/dL — ABNORMAL HIGH (ref 8–23)
CO2: 24 mmol/L (ref 22–32)
Calcium: 8.5 mg/dL — ABNORMAL LOW (ref 8.9–10.3)
Chloride: 108 mmol/L (ref 98–111)
Creatinine, Ser: 1.23 mg/dL (ref 0.61–1.24)
GFR, Estimated: 59 mL/min — ABNORMAL LOW (ref >60)
Glucose, Bld: 102 mg/dL — ABNORMAL HIGH (ref 70–99)
Potassium: 4.3 mmol/L (ref 3.5–5.1)
Sodium: 138 mmol/L (ref 135–145)
Total Bilirubin: 0.2 mg/dL — ABNORMAL LOW (ref 0.3–1.2)
Total Protein: 6 g/dL — ABNORMAL LOW (ref 6.5–8.1)

## 2022-01-30 LAB — MAGNESIUM: Magnesium: 1.9 mg/dL (ref 1.7–2.4)

## 2022-01-30 LAB — PHOSPHORUS: Phosphorus: 2.5 mg/dL (ref 2.5–4.6)

## 2022-01-30 MED ORDER — INSULIN ASPART 100 UNIT/ML IJ SOLN
0.0000 [IU] | INTRAMUSCULAR | Status: DC
  Administered 2022-01-31 (×2): 1 [IU] via SUBCUTANEOUS
  Administered 2022-02-01: 3 [IU] via SUBCUTANEOUS
  Administered 2022-02-01: 1 [IU] via SUBCUTANEOUS
  Administered 2022-02-01: 3 [IU] via SUBCUTANEOUS
  Administered 2022-02-01 – 2022-02-02 (×3): 1 [IU] via SUBCUTANEOUS
  Administered 2022-02-02 (×2): 2 [IU] via SUBCUTANEOUS
  Administered 2022-02-03 (×5): 1 [IU] via SUBCUTANEOUS
  Administered 2022-02-04: 2 [IU] via SUBCUTANEOUS
  Administered 2022-02-04: 1 [IU] via SUBCUTANEOUS

## 2022-01-30 MED ORDER — SODIUM CHLORIDE 0.9% FLUSH
10.0000 mL | INTRAVENOUS | Status: DC | PRN
  Administered 2022-02-15: 10 mL

## 2022-01-30 MED ORDER — OXYCODONE HCL 5 MG PO TABS
5.0000 mg | ORAL_TABLET | ORAL | Status: DC | PRN
  Administered 2022-01-30 (×2): 5 mg via ORAL
  Filled 2022-01-30 (×2): qty 1

## 2022-01-30 MED ORDER — ENSURE ENLIVE PO LIQD
237.0000 mL | ORAL | Status: DC
  Administered 2022-01-30: 13:00:00 237 mL via ORAL

## 2022-01-30 MED ORDER — TRAVASOL 10 % IV SOLN
INTRAVENOUS | Status: AC
  Filled 2022-01-30: qty 420

## 2022-01-30 MED ORDER — BOOST / RESOURCE BREEZE PO LIQD CUSTOM
1.0000 | ORAL | Status: DC
  Administered 2022-01-31: 1 via ORAL

## 2022-01-30 MED ORDER — POLYETHYLENE GLYCOL 3350 17 G PO PACK: 17.0000 g | PACK | Freq: Two times a day (BID) | ORAL | Status: DC | PRN

## 2022-01-30 NOTE — Progress Notes (Addendum)
PHARMACY - TOTAL PARENTERAL NUTRITION CONSULT NOTE   Indication: Small bowel obstruction and inability to meet nutritional needs w/ PO intake  Patient Measurements: Height: 5' 11" (180.3 cm) Weight: 58.2 kg (128 lb 4.9 oz) IBW/kg (Calculated) : 75.3 TPN AdjBW (KG): 57.7 Body mass index is 17.9 kg/m. Usual Weight: Weight noted in June 2022 to be 67 kg  Assessment: 81 yo male presenting to [facility] ED for abdominal pain, nausea, vomiting. PMH includes pancreatic cancer with evidence of metastatic disease to his bladder, lungs, and pleural space and history of SBO.  S/p stent placement between stomach and intestines and colostomy placement in June 2022. Pharmacy is consulted to begin TPN for possible bridge to surgery given low pre-albumin and inability to meet nutritional needs w/ PO intake.   Glucose / Insulin: No hx DM; start sensitive SSI q4h this evening Electrolytes: elytes wnl Renal: Scr 1.23 (appears near BL), BUN improving; CO2 wnl Hepatic: LFTs and alk phos WNL; Pre-alb low at 8.2; albumin low 2.5 Intake / Output; MIVF: KVO fluids; UOP ~855 mL; PO intake ~960 mL; pt noted colostomy output but not quantified in Epic GI Imaging: - 2/10 CTAP: re-demonstration of high-grade partial SBO with transition point in the upper left pelvis psych likely due to adhesion.  GI Surgeries / Procedures:  - None thus far this admit  Central access: PAC TPN start date: 2/12  Nutritional Goals: Pharmacy Calculated Goal TPN rate is 75 mL/hr (provides 90 g of protein and 1817 kcals per day)  RD Assessment:  Pending  Current Nutrition:  Full liquids and TPN  Plan:  Start TPN at half goal rate at 15m/hr at 1800 Electrolytes in TPN:  Na 547m/L  K 5032mL Ca 5mE67m Mg 5mEq65mPhos 15mmo52mCl:Ac 1:1 Add standard MVI and trace elements to TPN Initiate Sensitive q4h SSI and adjust as needed  Continue KVO fluids per MD Monitor TPN labs on Mon/Thurs F/u RD assessment and adjust nutritional goals  as necessary  Mary-ADimple NanasmD 01/30/2022,7:40 AM

## 2022-01-30 NOTE — Progress Notes (Addendum)
Subjective/Chief Complaint: Tol clears, little out bag, unchanged   Objective: Vital signs in last 24 hours: Temp:  [97.6 F (36.4 C)-98 F (36.7 C)] 97.6 F (36.4 C) (02/12 0418) Pulse Rate:  [67-71] 67 (02/12 0418) Resp:  [16-18] 18 (02/12 0418) BP: (120-134)/(88-93) 134/88 (02/12 0418) SpO2:  [95 %-97 %] 96 % (02/12 0418) Weight:  [58.2 kg] 58.2 kg (02/12 0418) Last BM Date: 01/29/22 (Minimal Output From Colostomy)  Intake/Output from previous day: 02/11 0701 - 02/12 0700 In: 1916.8 [P.O.:960; I.V.:956.8] Out: 855 [Urine:855] Intake/Output this shift: No intake/output data recorded.  GI: soft nontender palpable abd wall met little in stoma bag  Lab Results:  Recent Labs    01/29/22 0446 01/30/22 0500  WBC 12.7* 14.0*  HGB 11.9* 11.7*  HCT 38.4* 36.8*  PLT 610* 589*   BMET Recent Labs    01/29/22 0446 01/30/22 0500  NA 139 138  K 4.3 4.3  CL 108 108  CO2 21* 24  GLUCOSE 63* 102*  BUN 40* 26*  CREATININE 1.26* 1.23  CALCIUM 8.4* 8.5*   PT/INR No results for input(s): LABPROT, INR in the last 72 hours. ABG No results for input(s): PHART, HCO3 in the last 72 hours.  Invalid input(s): PCO2, PO2  Studies/Results: CT ABDOMEN PELVIS WO CONTRAST  Result Date: 01/28/2022 CLINICAL DATA:  Abdominal pain. Suspicion of bowel obstruction. History of colostomy EXAM: CT ABDOMEN AND PELVIS WITHOUT CONTRAST TECHNIQUE: Multidetector CT imaging of the abdomen and pelvis was performed following the standard protocol without IV contrast. RADIATION DOSE REDUCTION: This exam was performed according to the departmental dose-optimization program which includes automated exposure control, adjustment of the mA and/or kV according to patient size and/or use of iterative reconstruction technique. COMPARISON:  01/07/2022 FINDINGS: Lower chest: Bilateral pulmonary nodules consistent with metastasis. Index right lower lobe 2.2 cm nodule on 12/04 is similar to on the prior. Lingular  nodule measures 1.2 cm on 12/04. Loculated left-sided pleural effusion with pleural thickening, similar. Hepatobiliary: Hepatic cysts. Cholecystectomy, without biliary ductal dilatation. Pancreas: Per report partial pancreatectomy. Pancreas poorly evaluated secondary to technique and paucity of retroperitoneal fat. Spleen: Surgically absent Adrenals/Urinary Tract: Normal adrenal glands. Left ureteric stent and nephrostomy catheter in place. Marked right renal atrophy with chronic hydroureteronephrosis. This is followed to an area of persistent distal right ureteric hyperattenuation including on 77/2. Stomach/Bowel: The stomach is decompressed, appears mildly thick walled. A duodenal stent is similar in position, without upstream dilatation. Hartmann's pouch. The remaining sigmoid is thick walled. Descending colostomy. Proximal and mid small bowel loops are fluid-filled and dilated including at up to 4.3 cm on 47/2. 4.9 cm at the same level on the prior. Transition point in the left upper pelvis again suspected, including on 67/2. Distal small bowel loops are decompressed. Normal appendix. Vascular/Lymphatic: Aortic atherosclerosis. No gross abdominopelvic adenopathy. Reproductive: Mild prostatomegaly. Other: No significant free fluid. No free intraperitoneal air. Right abdominal wall subcutaneous 2.6 cm nodule on 46/2 is similar to on the prior. Suspect developing left chest wall/intercostal metastasis, as evidenced by soft tissue fullness on 01/02. Musculoskeletal: Bilateral hip osteoarthritis. Lumbosacral spondylosis. IMPRESSION: 1. Degraded exam, secondary to lack of oral or IV contrast. 2. Since 01/07/2022, redemonstration of high-grade partial small-bowel obstruction with transition point in the upper left pelvis psych: likely due to an adhesion. 3. Pulmonary metastasis with loculated left-sided pleural effusion and probable anterior left chest wall intercostal metastasis. 4. Right renal atrophy and  hydroureteronephrosis. Possible distal right ureteric stones versus retained contrast. 5.  Similar right abdominal wall soft tissue nodule, suspicious for metastatic disease. Electronically Signed   By: Abigail Miyamoto M.D.   On: 01/28/2022 18:05    Anti-infectives: Anti-infectives (From admission, onward)    Start     Dose/Rate Route Frequency Ordered Stop   01/30/22 1800  cefTRIAXone (ROCEPHIN) 1 g in sodium chloride 0.9 % 100 mL IVPB        1 g 200 mL/hr over 30 Minutes Intravenous Every 24 hours 01/29/22 0013     01/28/22 1830  cefTRIAXone (ROCEPHIN) 1 g in sodium chloride 0.9 % 100 mL IVPB        1 g 200 mL/hr over 30 Minutes Intravenous  Once 01/28/22 1828 01/28/22 1927       Assessment/Plan: Stage IV pancreatic cancer with psbo -he does not require surgery at this time -can have fulls today and he will regulate himself -prealbumin 8.2- if there is going to be any possibility of surgery then will need this better, will write for tpn today as he cannot meet needs enterally -need to have oncology see him- there is not a good solution surgically for this disease and there is certainly risk given prior findings. Some coordinated plan on how to treat him is probably best.  He is currently not undergoing treatment for pancreatic cancer so not sure if doing surgery given old op report is the best option or that it will actually make him better.   -will also be helpful to discuss with recent operative surgeon with respect to findings as it sounds like that was very difficult and dont know if surgery is really even a reasonable option at this point.  -what argues for considering surgery is his indolent course of pancreatic adeno and has been living with metastatic disease.  I think that a consideration of surgery as long as ntn better, oncology plan made, discussed with prior operative surgeon and he understands the pros/cons (we had a long discussion about this today) it is reasonable to consider.   He certainly can be made worse with surgery and by the NSQIP calculator his risk of death is over 25%.  Also risk of snf is high.   -will discuss with Dr [name] as well who will be the in hospital surgeon this week I reviewed hospitalist notes, last 24 h vitals and pain scores, last 48 h intake and output, last 24 h labs and trends, and last 24 h imaging results.  This care required high  level of medical decision making.    Rolm Bookbinder 01/30/2022

## 2022-01-30 NOTE — Progress Notes (Signed)
°  Progress Note   Patient: Kevin Villegas PNT:[id number] DOB: 07-25-1941 DOA: 01/28/2022     1 DOS: the patient was seen and examined on 01/30/2022   Brief hospital course: 81 y.o. male with medical history significant of stage 4 pancreatic cancer, status post colectomy, hydronephrosis status post nephrostomy, hypertension, SBO, CKD 3A presents with ongoing abdominal pain.  Patient states he has had 2 to 3 days of increased abdominal pain.  Pain 8 out of 10 today.  He is concerned for issues with his known small bowel obstruction as he has a history of the same.  He states he does have some output in his ostomy still.  Has had some nausea and vomiting. Pt was found to have SBO. General Surgery consulted  Assessment and Plan: No notes have been filed under this hospital service. Service: Hospitalist  Small bowel obstruction > Patient presented with abdominal pain for the past couple days he was concern for possible small bowel obstruction as he has had similar episodes in the past. > CT did not demonstrate small bowel obstruction.  General surgery following and initially recommended NG tube, however pt improved and declined NG -Diet advanced to full liquid per Surgery -Difficult situation given prior surgical history related to stage 4 pancreatic cancer. -General Surgery following -Have added Dr. Marin Olp to treatment team -Cont analgesia and supportive care -Given malnutrition, TPN ordered by Surgery   CKD 3A > Creatinine stable 1.5 in ED - Avoid nephrotoxic agents - Cr improved with hydration  Hydronephrosis > Status post left nephrostomy -Cont to follow bmet trends  Stage 4 Pancreatic cancer > Known metastatic pancreatic cancer.  Follows with oncology, Dr. Marin Olp. - Dr. Marin Olp had been added to treatment team   Hypertension - Not currently any medication for this -BP stable and controlled at this time     Subjective: Tolerating liquid diet  Physical Exam: Vitals:    01/29/22 1335 01/29/22 2005 01/30/22 0418 01/30/22 1157  BP: 120/89 (!) 130/93 134/88 (!) 134/99  Pulse: 71 69 67 61  Resp: 16 18 18 16   Temp: 97.6 F (36.4 C) 98 F (36.7 C) 97.6 F (36.4 C) 97.9 F (36.6 C)  TempSrc: Oral Oral Oral Oral  SpO2: 95% 97% 96% 94%  Weight:   58.2 kg   Height:       General exam: Conversant, in no acute distress Respiratory system: normal chest rise, clear, no audible wheezing Cardiovascular system: regular rhythm, s1-s2 Gastrointestinal system: Nondistended, nontender, pos BS Central nervous system: No seizures, no tremors Extremities: No cyanosis, no joint deformities Skin: No rashes, no pallor Psychiatry: Affect normal // no auditory hallucinations   Data Reviewed:  Labs reviewed, Cr improved  Family Communication: Pt in room, family not at bedside  Disposition: Status is: Inpatient Requires inpatient because: Severity of illness    Planned Discharge Destination: Home    Author: Marylu Lund, MD 01/30/2022 4:14 PM  For on call review [email or web address].

## 2022-01-30 NOTE — Progress Notes (Addendum)
Initial Nutrition Assessment RD working remotely.  DOCUMENTATION CODES:   Underweight  INTERVENTION:  - will order Ensure Plus High Protein once/day, each supplement provides 350 kcal and 20 grams of protein. - will order Boost Breeze once/day, each supplement provides 250 kcal and 9 grams of protein. - TPN management per Pharmacist. - complete NFPE at follow-up.   Monitor magnesium, potassium, and phosphorus BID for at least 3 days, MD to replete as needed, as pt is at risk for refeeding syndrome given 10% wt loss in 3 months, likely malnutrition.   NUTRITION DIAGNOSIS:   Increased nutrient needs related to cancer and cancer related treatments as evidenced by estimated needs.  GOAL:   Patient will meet greater than or equal to 90% of their needs  MONITOR:   PO intake, Supplement acceptance, Diet advancement, Labs, Weight trends, I & O's, Other (Comment) (TPN regimen)  REASON FOR ASSESSMENT:   Malnutrition Screening Tool, Consult New TPN/TNA  ASSESSMENT:   81 y.o. male with medical history of stage 4 pancreatic cancer, hydronephrosis s/p nephrostomy, HTN, SBO s/p colectomy, and stage 3 CKD. He presented to the ED due to ongoing abdominal pain x2-3 days with pain being 8/10. He also reported N/V. He was having some ostomy output. He was found to have recurrent SBO and General Surgery was consulted.  Diet advanced to [facility] yesterday at 0816 and to [place] today at 0733. He is noted to have consumed 0% of all meals yesterday.  He has implanted port in R chest. Order in place for custom TPN starting at 35 ml/hr today at 1800.   Weight today is 128 lb and weight is trending up since 2/10 when he weighed 126 lb. Weight on 10/30/21 was 142 lb. This indicates 14 lb weight loss (10% body weight) in the past 3 months; significant for time frame.  Surgeon's note this AM indicates patient does not need surgery at this time.   Labs reviewed; BUN: 26 mg/l, Ca: 8.5 mg/dl, GFR: 59  ml/min. Medications reviewed; sliding scale novolog.   NUTRITION - FOCUSED PHYSICAL EXAM:  RD working remotely.  Diet Order:   Diet Order             Diet full liquid Room service appropriate? Yes; Fluid consistency: Thin  Diet effective now                   EDUCATION NEEDS:   Not appropriate for education at this time  Skin:  Skin Assessment: Reviewed RN Assessment  Last BM:  2/11 (type 4 in ostomy)  Height:   Ht Readings from Last 1 Encounters:  01/28/22 5\' 11"  (1.803 m)    Weight:   Wt Readings from Last 1 Encounters:  01/30/22 58.2 kg     BMI:  Body mass index is 17.9 kg/m.   Estimated Nutritional Needs:  Kcal:  2050-2250 kcal Protein:  105-120 grams Fluid:  >/= 2.2 L/day      Jarome Matin, MS, RD, LDN Inpatient Clinical Dietitian RD pager # available in [place]  After hours/weekend pager # available in [facility]

## 2022-01-31 ENCOUNTER — Inpatient Hospital Stay: Payer: Medicare Other

## 2022-01-31 ENCOUNTER — Inpatient Hospital Stay: Payer: Medicare Other | Admitting: Hematology & Oncology

## 2022-01-31 ENCOUNTER — Encounter: Payer: Self-pay | Admitting: *Deleted

## 2022-01-31 DIAGNOSIS — I10 Essential (primary) hypertension: Secondary | ICD-10-CM | POA: Diagnosis not present

## 2022-01-31 DIAGNOSIS — K56609 Unspecified intestinal obstruction, unspecified as to partial versus complete obstruction: Secondary | ICD-10-CM | POA: Diagnosis not present

## 2022-01-31 LAB — TRIGLYCERIDES: Triglycerides: 115 mg/dL (ref <150)

## 2022-01-31 LAB — COMPREHENSIVE METABOLIC PANEL
ALT: 17 U/L (ref 0–44)
AST: 24 U/L (ref 15–41)
Albumin: 2.3 g/dL — ABNORMAL LOW (ref 3.5–5.0)
Alkaline Phosphatase: 58 U/L (ref 38–126)
Anion gap: 6 (ref 5–15)
BUN: 22 mg/dL (ref 8–23)
CO2: 24 mmol/L (ref 22–32)
Calcium: 8.7 mg/dL — ABNORMAL LOW (ref 8.9–10.3)
Chloride: 106 mmol/L (ref 98–111)
Creatinine, Ser: 0.97 mg/dL (ref 0.61–1.24)
GFR, Estimated: >60 mL/min (ref >60)
Glucose, Bld: 130 mg/dL — ABNORMAL HIGH (ref 70–99)
Potassium: 4.4 mmol/L (ref 3.5–5.1)
Sodium: 136 mmol/L (ref 135–145)
Total Bilirubin: 0.1 mg/dL — ABNORMAL LOW (ref 0.3–1.2)
Total Protein: 5.9 g/dL — ABNORMAL LOW (ref 6.5–8.1)

## 2022-01-31 LAB — GLUCOSE, CAPILLARY
Glucose-Capillary: 119 mg/dL — ABNORMAL HIGH (ref 70–99)
Glucose-Capillary: 114 mg/dL — ABNORMAL HIGH (ref 70–99)
Glucose-Capillary: 106 mg/dL — ABNORMAL HIGH (ref 70–99)
Glucose-Capillary: 106 mg/dL — ABNORMAL HIGH (ref 70–99)
Glucose-Capillary: 123 mg/dL — ABNORMAL HIGH (ref 70–99)

## 2022-01-31 LAB — URINE CULTURE: Culture: >=100000 — AB

## 2022-01-31 LAB — PHOSPHORUS: Phosphorus: 3 mg/dL (ref 2.5–4.6)

## 2022-01-31 LAB — MAGNESIUM: Magnesium: 1.7 mg/dL (ref 1.7–2.4)

## 2022-01-31 MED ORDER — TRAVASOL 10 % IV SOLN
INTRAVENOUS | Status: AC
  Filled 2022-01-31: qty 684

## 2022-01-31 MED ORDER — ACETAMINOPHEN 500 MG PO TABS
1000.0000 mg | ORAL_TABLET | ORAL | Status: AC
  Administered 2022-02-01: 1000 mg via ORAL
  Filled 2022-01-31: qty 2

## 2022-01-31 MED ORDER — BUPIVACAINE LIPOSOME 1.3 % IJ SUSP: 20.0000 mL | Freq: Once | INTRAMUSCULAR | Status: DC

## 2022-01-31 MED ORDER — CHLORHEXIDINE GLUCONATE CLOTH 2 % EX PADS: 6.0000 | MEDICATED_PAD | Freq: Once | CUTANEOUS | Status: DC

## 2022-01-31 MED ORDER — GABAPENTIN 300 MG PO CAPS
300.0000 mg | ORAL_CAPSULE | ORAL | Status: AC
  Administered 2022-02-01: 300 mg via ORAL
  Filled 2022-01-31: qty 1

## 2022-01-31 MED ORDER — ENOXAPARIN SODIUM 40 MG/0.4ML IJ SOSY
40.0000 mg | PREFILLED_SYRINGE | INTRAMUSCULAR | Status: DC
  Administered 2022-01-31: 40 mg via SUBCUTANEOUS
  Filled 2022-01-31: qty 0.4

## 2022-01-31 MED ORDER — MAGNESIUM SULFATE IN D5W 1-5 GM/100ML-% IV SOLN
1.0000 g | Freq: Once | INTRAVENOUS | Status: AC
  Administered 2022-01-31: 1 g via INTRAVENOUS
  Filled 2022-01-31: qty 100

## 2022-01-31 MED ORDER — SODIUM CHLORIDE 0.9 % IV SOLN
2.0000 g | INTRAVENOUS | Status: AC
  Administered 2022-02-01: 2 g via INTRAVENOUS
  Filled 2022-01-31: qty 2

## 2022-01-31 NOTE — Progress Notes (Signed)
Patient was due to come in today for discussion about next steps, but was admitted to the hospital on Friday with a SBO. Appointments for today cancelled.   Will follow for post discharge needs and follow up.   Oncology Nurse Navigator Documentation  Oncology Nurse Navigator Flowsheets 01/31/2022  Navigator Follow Up Date: 02/04/2022  Navigator Follow Up Reason: Appointment Review  Navigator Location [facility]  Navigator Encounter Type Appt/Treatment Plan Review  Telephone -  Patient Visit Type MedOnc  Treatment Phase Active Tx  Barriers/Navigation Needs Coordination of Care;Education  Education -  Interventions None Required  Acuity Level 2-Minimal Needs (1-2 Barriers Identified)  Coordination of Care -  Education Method -  Support Groups/Services Friends and Family  Time Spent with Patient 15

## 2022-01-31 NOTE — Progress Notes (Signed)
°  Progress Note   Patient: Kevin Villegas QGB:[id number] DOB: 07/31/1941 DOA: 01/28/2022     2 DOS: the patient was seen and examined on 01/31/2022   Brief hospital course: 81 y.o. male with medical history significant of stage 4 pancreatic cancer, status post colectomy, hydronephrosis status post nephrostomy, hypertension, SBO, CKD 3A presents with ongoing abdominal pain.  Patient states he has had 2 to 3 days of increased abdominal pain.  Pain 8 out of 10 today.  He is concerned for issues with his known small bowel obstruction as he has a history of the same.  He states he does have some output in his ostomy still.  Has had some nausea and vomiting. Pt was found to have SBO. General Surgery consulted  Assessment and Plan: No notes have been filed under this hospital service. Service: Hospitalist  Small bowel obstruction > Patient presented with abdominal pain for the past couple days he was concern for possible small bowel obstruction as he has had similar episodes in the past. > CT did not demonstrate small bowel obstruction.  General surgery following and initially recommended NG tube, however pt improved and declined NG -Diet advanced to full liquid per Surgery -Difficult situation given prior surgical history related to stage 4 pancreatic cancer. -General Surgery following -Appreciate input by Oncology -Cont analgesia and supportive care -Now on TPN given malnutrition -Discussed with Dr. [name]. Plan for diagnostic laparoscopy with lysis of adhesions 2/14   CKD 3A > Creatinine stable 1.5 in ED - Avoid nephrotoxic agents - Cr improved with hydration -recheck bmet in AM  Hydronephrosis > Status post left nephrostomy -Cont to follow bmet trends  Stage 4 Pancreatic cancer > Known metastatic pancreatic cancer.  Follows with oncology, Dr. Marin Olp. - Appreciate input by Dr. Marin Olp   Hypertension - Not currently any medication for this -BP stable and controlled at this time      Subjective: Tolerating liquid diet  Physical Exam: Vitals:   01/30/22 1157 01/30/22 1938 01/31/22 0402 01/31/22 1316  BP: (!) 134/99 (!) 144/85 (!) 126/95 (!) 135/95  Pulse: 61 73 74 68  Resp: 16 18 18 16   Temp: 97.9 F (36.6 C) 98.3 F (36.8 C) (!) 97.4 F (36.3 C) 97.7 F (36.5 C)  TempSrc: Oral Oral Oral Oral  SpO2: 94% 98% 95% 96%  Weight:      Height:       General exam: Awake, laying in bed, in nad Respiratory system: Normal respiratory effort, no wheezing Cardiovascular system: regular rate, s1, s2 Gastrointestinal system: Soft, nondistended, positive BS Central nervous system: CN2-12 grossly intact, strength intact Extremities: Perfused, no clubbing Skin: Normal skin turgor, no notable skin lesions seen Psychiatry: Mood normal // no visual hallucinations   Data Reviewed:  Labs reviewed, Cr improved  Family Communication: Pt in room, family not at bedside  Disposition: Status is: Inpatient Requires inpatient because: Severity of illness    Planned Discharge Destination: Home    Author: Marylu Lund, MD 01/31/2022 2:53 PM  For on call review [email or web address].

## 2022-01-31 NOTE — Progress Notes (Addendum)
PHARMACY - TOTAL PARENTERAL NUTRITION CONSULT NOTE   Indication: Small bowel obstruction and inability to meet nutritional needs w/ PO intake  Patient Measurements: Height: 5' 11"  (180.3 cm) Weight: 58.2 kg (128 lb 4.9 oz) IBW/kg (Calculated) : 75.3 TPN AdjBW (KG): 57.7 Body mass index is 17.9 kg/m. Usual Weight: Weight noted in November 2022 to be 63.5 kg  Assessment: 81 yo male presenting to [facility] ED for abdominal pain, nausea, vomiting. PMH includes pancreatic cancer with evidence of metastatic disease to his bladder, lungs, and pleural space and history of SBO.  S/p stent placement between stomach and intestines and colostomy placement in June 2022. Pharmacy is consulted to begin TPN for possible bridge to surgery given low pre-albumin and inability to meet nutritional needs w/ PO intake.   Glucose / Insulin: No hx DM; 1 unit sensitive SSI required since initiation of TPN. CBGs at goal (range 107-124).  Electrolytes: Magnesium on lower end of goal rage at 1.7. All others, including Corrected Ca, WNL.  Renal: SCr/BUN improved to WNL. CO2 WNL. Hepatic: AST/ALT, Alk Phos WNL. Tbili low. Pre-albumin low at 8.2 (2/11); albumin low at 2.3. TG 115 (2/13) Intake / Output; MIVF: I/O 380/650. Currently no MIVF infusing (has [facility] at [facility] ordered as needed for administration of meds).  GI Imaging: - 2/10 CTAP: re-demonstration of high-grade partial SBO with transition point in the upper left pelvis psych likely due to adhesion.  GI Surgeries / Procedures:  - None thus far this admit  Central access: PAC TPN start date: 2/12  Nutritional Goals: Goal TPN rate is 85 mL/hr (provides 112g of protein and 2101 kcals per day  RD Assessment:  Estimated Needs Total Energy Estimated Needs: 2050-2250 kcal Total Protein Estimated Needs: 105-120 grams Total Fluid Estimated Needs: >/= 2 L/day  Current Nutrition:  Full liquids (NPO after midnight for procedure 2/14) TPN Ensure Plus once daily Boost Breeze  once daily  Plan:  Now:  Magnesium Sulfate 1g IV x 1  At 1800:  Increase TPN rate to 38m/hr Electrolytes in TPN:  Na 575m/L  K 5041mL Ca 5mE72m Mg 7mEq21m(increased) Phos 15mmo94mCl:Ac 1:1 Add standard MVI and trace elements to TPN Continue Sensitive q4h SSI and adjust as needed  No IVF currently running at this time Monitor TPN labs on Mon/Thurs CMET, Mag, Phos in AM   Ethridge Sollenberger Lindell SparmD, BCPS Clinical Pharmacist 01/31/2022,1:50 PM

## 2022-01-31 NOTE — Progress Notes (Signed)
[name] is very well-known to me.  He is a 81 year old white male.  He has metastatic pancreatic cancer although his cancer clearly has not shown itself to be all that aggressive.  He has another bout of bowel obstruction.  He does have an ostomy.  He had a CT of the abdomen and pelvis done.  Looks like there is a high-grade bowel obstruction.  However, the radiologist felt that this was likely an adhesion.  He had a PET scan that was done about 2 months ago.  Everything looked relatively stable in the abdomen and pelvis.  His last CA 19-9 was 14.  The CEA was elevated at 122.  He is now on TNA.  His labs today show sodium 136.  Potassium 4.4.  BUN 22 creatinine 0.97.  Calcium is 8.7 albumin of 2.3.  His prealbumin was 8.2 a couple days ago.  His white cell count was 14.  Hemoglobin 9.7.  Platelet count 589,000.  I long talk with him.  He definitely would be willing to consider systemic chemotherapy if surgery can get the bowel obstruction resolved.  Again, looks like the bowel obstruction is from adhesions and not from metastatic cancer.  Again, the only way we would know this is actually going to see what is going on.  He is on TNA.  He is taking some liquids.  Hopefully he will be able to tolerate full liquids.  I just feel bad that he is in the hospital again with another bowel obstruction.  He has had no fever.  There is no cough or shortness of breath.  He has a nephrostomy that is working.  He has had no bleeding.  I would have to say his performance status is probably ECOG 1.  On his vital signs, temperature is 97.4.  Pulse 74.  Blood pressure 126/95.  His abdomen is soft.  He has ostomy bag.  He has no swelling.  There is no guarding or rebound tenderness.  There is no obvious abdominal mass.  There is no palpable liver or spleen tip.  Bowel sounds are somewhat decreased.  Cardiac exam regular rate and rhythm.  Lungs are clear bilaterally.  Extremities shows no clubbing, cyanosis  or edema.  He has good strength bilaterally.  Neurological exam shows no focal neurological deficits.  Again, we will have to see what surgery can do for him.  It would be nice to try to fix this.  Again, the only way that we will know if malignancy is the cause is actually going there.  He says he would take treatment.  We probably will need to have another PET scan on him to see what changes have evolved over the past couple months.  Unfortunately, we cannot do this with him in the hospital.  I know that he will get incredible care from all the staff upon 6 E.   Lattie Haw, MD  Jeneen Rinks 1:5

## 2022-01-31 NOTE — Progress Notes (Signed)
Note: Portions of this report may have been transcribed using voice recognition software. Every effort was made to ensure accuracy; however, inadvertent computerized transcription errors may be present.   Any transcriptional errors that result from this process are unintentional.              Kevin Villegas  [birth year] [id number]  Patient Care Team: Dettinger, [name], MD as PCP - General (Family Medicine) Mariah Milling, Karyl Kinnier., MD as Consulting Physician (Surgical Oncology) Volanda Napoleon, MD as Medical Oncologist (Oncology) Cordelia Poche, RN as Oncology Nurse Navigator   Subjective/Chief Complaint: Patient sitting up with full liquid tray.  Trying a few sips and a few spoonfuls of cream week.  Passing some gas but gets bloated very easily.  He wished to explain his past medical history and prior consultations.     Objective: Vital signs in last 24 hours: Temp:  [97.4 F (36.3 C)-98.3 F (36.8 C)] 97.4 F (36.3 C) (02/13 0402) Pulse Rate:  [61-74] 74 (02/13 0402) Resp:  [16-18] 18 (02/13 0402) BP: (126-144)/(85-99) 126/95 (02/13 0402) SpO2:  [94 %-98 %] 95 % (02/13 0402) Last BM Date: 01/29/22 (minimal output in ostomy)   Intake/Output from previous day: 02/12 0701 - 02/13 0700 In: 380 [I.V.:280; IV Piggyback:100] Out: 650 [Urine:650] Intake/Output this shift: No intake/output data recorded.   PE:  Constitutional: Not cachectic.  Hygeine adequate.  Vitals signs as above.   Eyes: Pupils reactive, normal extraocular movements. Sclera nonicteric Neuro: CN II-XII intact.  No major focal sensory defects.  No major motor deficits. Lymph: No head/neck/groin lymphadenopathy Psych:  No severe agitation.  No severe anxiety.  Judgment & insight Adequate, Oriented x4, HENT: Normocephalic, Mucus membranes moist.  No thrush.   Neck: Supple, No tracheal deviation.  No obvious thyromegaly Chest: No pain to chest wall compression.  Good respiratory excursion.  No audible  wheezing CV:  Pulses intact.   Regular rhythm.  No major extremity edema  Abdomen:  Flat Hernia: Not present. Diastasis recti: Not present. Soft.   Mildly distended.  Nontender.  No hepatomegaly.  No splenomegaly.  He is somewhat thin-walled.  In right subcostal ridge is a 4 x 3 cm subcutaneous nodule.  I feel a smaller 1 to 2 x 2 cm near the right lower quadrant.  These correlate with a possible abdominal wall mets.  Left-sided colostomy pink with scant gas and flatus in bag  Gen:  Inguinal hernia: Not present.  Inguinal lymph nodes: without lymphadenopathy.    Rectal: (Deferred)  Ext: No obvious deformity or contracture.  Edema: Not present.  No cyanosis Skin: No major subcutaneous nodules.  Warm and dry Musculoskeletal: Severe joint rigidity not present.  No obvious clubbing.  No digital petechiae.   ensation is normal throughout Psych: A&Ox3 with an appropriate affect.      Lab Results:  Recent Labs (last 2 labs)       Recent Labs    01/29/22 0446 01/30/22 0500  WBC 12.7* 14.0*  HGB 11.9* 11.7*  HCT 38.4* 36.8*  PLT 610* 589*       BMET Recent Labs (last 2 labs)       Recent Labs    01/30/22 0500 01/31/22 0507  NA 138 136  K 4.3 4.4  CL 108 106  CO2 24 24  GLUCOSE 102* 130*  BUN 26* 22  CREATININE 1.23 0.97  CALCIUM 8.5* 8.7*       PT/INR Recent Labs (last 2 labs)   No results  for input(s): LABPROT, INR in the last 72 hours.   ABG  Recent Labs (last 2 labs)   No results for input(s): PHART, HCO3 in the last 72 hours.   Invalid input(s): PCO2, PO2     Studies/Results: Imaging Results (Last 48 hours)  No results found.     Anti-infectives: Anti-infectives (From admission, onward)        Start     Dose/Rate Route Frequency Ordered Stop    01/30/22 1000   cefTRIAXone (ROCEPHIN) 1 g in sodium chloride 0.9 % 100 mL IVPB        1 g 200 mL/hr over 30 Minutes Intravenous Every 24 hours 01/29/22 0013      01/28/22 1830   cefTRIAXone (ROCEPHIN) 1 g in  sodium chloride 0.9 % 100 mL IVPB        1 g 200 mL/hr over 30 Minutes Intravenous  Once 01/28/22 1828 01/28/22 1927             Assessment/Plan: Stage IV pancreatic cancer with psbo  Patient clearly has metastatic disease in his lung and abdominal wall and original site.  However they seem relatively stable.  There is no definite metastasis at the site of probable obstruction in the left pelvis.  It is reasonable to attempt diagnostic laparoscopy with lyse adhesions in the hope of freeing off some benign adhesions other source of his obstruction.  Could be that there is metastatic disease at this site.  If it is very isolated perhaps I can do a small bowel resection to control this.  If there is evidence of carcinomatosis and diffuse metastatic disease, then I would be very reluctant to proceed with any bowel resection as most likely it would fail break apart and he did end up with a painful leak and enterocutaneous fistula.  He understands that his situation is very difficult.  He wishes to be aggressive since he has decent performance status and quality of life and is hoping that this can be straightforwardly fixed.  I do worry that his insight into his metastatic disease is a little more dismissive than what truly is going on.  However according to Dr. Marin Olp, it does not look like the cancer is rapidly growing.  So perhaps there is some palliative chemotherapy options that may be of use.  If I can get rid of the abdominal wall masses at the time of surgery, I might try.  However I do not want to make larger incisions if not needed and the fact that these developed after the last operation in less than a year makes me concerned about new issues.  He has metastatic disease elsewhere so most likely will try and keep it simple.  He understands he may not be able to successfully do that and he will switch to palliation if needed.  Sometimes I would consider a gastrostomy tube but given the fact  most of his stomach has been resected, I do not know if that is a good option.  He has already had some breakfast of full liquid food.  We will set this up for tomorrow, 2/14.  Spent extra time with the patient and allowed him to express his concerns.  He wanted to make sure that he felt comfortable with me.  He expressed appreciation for Dr. Donne Hazel, Watt Climes, nursing and the rest the team.  Discussed with Dr. Wyline Copas who is about coming to the room and discuss as well.  Patient calling to update his wife  as well.  The anatomy & physiology of the digestive tract was discussed.  The pathophysiology of intestinal obstruction was discussed.  Natural history risks without surgery was discussed.   I feel the patient has failed non-operative therapies.  The risks of no intervention will lead to serious problems such as necrosis, perforation, dehydration, etc. that outweigh the operative risks; therefore, I recommended abdominal exploration to diagnose & treat the source of the problem.  Minimally Invasive & open techniques were discussed.   I expressed a good likelihood that surgery will treat the problem.  Risks such as bleeding, infection, abscess, leak, reoperation, bowel resection, possible ostomy, hernia, injury to other organs, need for repair of tissues / organs, need for further treatment, heart attack, death, and other risks were discussed.   I noted a good likelihood this will help address the problem.  Goals of post-operative recovery were discussed as well.  We will work to minimize complications. Questions were answered.  The patient expresses understanding & wishes to proceed with surgery.   FEN: Moderate malnutrition but not severe cachexia., TPN start 2/12 ID: VTE:   I reviewed hospitalist notes, last 24 h vitals and pain scores, last 48 h intake and output, last 24 h labs and trends, and last 24 h imaging results.   This care required high  level of medical decision making.       Narrative & Impression  CLINICAL DATA:  Subsequent treatment strategy for metastatic pancreatic carcinoma. Previous distal pancreatectomy.   EXAM: NUCLEAR MEDICINE PET SKULL BASE TO THIGH   TECHNIQUE: 7.8 mCi F-18 FDG was injected intravenously. Full-ring PET imaging was performed from the skull base to thigh after the radiotracer. CT data was obtained and used for attenuation correction and anatomic localization.   Fasting blood glucose: 86 mg/dl   COMPARISON:  Noncontrast AP CT on 11/09/2021   FINDINGS: Mediastinal blood-pool activity (background): SUV max = 2.3   Liver activity (reference): SUV max = N/A   NECK:  No hypermetabolic lymph nodes or masses.   Incidental CT findings:  None.   CHEST: Numerous hypermetabolic pulmonary nodules are seen throughout both lungs, consistent with diffuse pulmonary metastases. Several show mild increase in size compared to those seen on prior abdomen CT. An index nodule in the posterior right lower lobe measures 2.1 x 1.9 cm on image 49/10, with SUV 5.9.   Small left pleural effusion is increased in size. Numerous sites of FDG uptake are seen within left pleural space which correspond with areas of mild pleural thickening. Index lesion the anterior left pleural space has SUV max of 12.8, and these are consistent with diffuse pleural metastatic disease with small malignant pleural effusion.   Incidental CT findings:  None.   ABDOMEN/PELVIS: Soft tissue mass in the area of the pancreatic body along the medial aspect of the duodenal stent currently measures 3.3 x 2.7 cm on image 129/4, slightly decreased from 3.8 x 3.0 cm previously. This shows hypermetabolic activity, with SUV max of 9.9, consistent with recurrent carcinoma.   No abnormal hypermetabolic activity within the liver, adrenal glands, or spleen. No hypermetabolic lymph nodes in the abdomen or pelvis. Poorly defined soft tissue density is seen in the right anterior  abdominal wall measuring 2.4 x 2.2 cm on image 139/, which is hypermetabolic, with SUV max of 7 6. A smaller asymmetric soft tissue density is seen in the right lower quadrant abdominal wall soft tissues measuring 1.9 x 1.5 cm on image 154/4, which also shows  FDG uptake, with SUV max of 3.6. These are not significantly changed compared to recent CT, and consistent with abdominal wall metastases.   Incidental CT findings: Several small fluid attenuation hepatic cysts are again seen, which show no FDG uptake. Chronic right renal parenchymal atrophy and hydroureteronephrosis show no significant change, with persistent 5 mm calculus in the distal right ureter. New left percutaneous nephrostomy tube is seen in appropriate position, and double pigtail internal left ureteral stent remains in place, with resolution of left hydronephrosis since prior exam. Stable mildly enlarged prostate.   SKELETON: No focal hypermetabolic bone lesions to suggest skeletal metastasis.   Incidental CT findings:  None.   IMPRESSION: Diffuse bilateral pulmonary metastases, some in the lung bases showing mild increase in size compared to recent abdomen CT.   Increased size of small malignant left pleural effusion, with diffuse pleural metastatic disease.   Slight decrease in size of hypermetabolic soft tissue mass in region of pancreatic body, consistent with recurrent carcinoma.   Stable size of 2 small hypermetabolic soft tissue densities in the right anterior abdominal wall soft tissues, consistent with metastatic disease.   Resolution of left hydronephrosis following percutaneous nephrostomy tube placement.     Electronically Signed   By: Marlaine Hind M.D.   On: 11/29/2021 09:19    Patient Active Problem List   Diagnosis Date Noted   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  with metastatic disease 06/27/2019    Priority: High   SBO (small bowel obstruction) ([place]) 01/28/2022    Subcutaneous nodule of abdominal wall 01/09/2022   Partial small bowel obstruction ([place]) 01/07/2022   COVID-19 virus infection 01/07/2022   Colostomy in place [facility]) 11/10/2021   Stage 3a chronic kidney disease (CKD) ([place]) 11/10/2021   Pyelonephritis    Essential hypertension 10/30/2021   Hydronephrosis of left kidney 10/28/2021   Ureteral stricture, left 10/27/2021   Protein-calorie malnutrition, severe 06/12/2021   Hydroureter on right 06/10/2021   Aortic atherosclerosis ([place]) 06/10/2021   Right ureteral stone 06/02/2021   Colon stricture ([place])    Pancreatic mass 06/27/2019   Rhinitis medicamentosa 02/26/2019    Past Medical History:  Diagnosis Date   Colostomy present ([place])    Large bowel obstruction ([place])    Pancreatic cancer ([place])     Past Surgical History:  Procedure Laterality Date   ABDOMINAL SURGERY     BIOPSY  05/20/2021   Procedure: BIOPSY;  Surgeon: Otis Brace, MD;  Location: [facility];  Service: Gastroenterology;;   BOWEL RESECTION     BOWEL RESECTION     CHOLECYSTECTOMY N/A 12/16/2014   Procedure: LAPAROSCOPIC CHOLECYSTECTOMY WITH INTRAOPERATIVE CHOLANGIOGRAM;  Surgeon: Georganna Skeans, MD;  Location: [facility];  Service: General;  Laterality: N/A;   COLONIC STENT PLACEMENT N/A 05/26/2021   Procedure: COLONIC STENT PLACEMENT;  Surgeon: Clarene Essex, MD;  Location: [facility];  Service: Endoscopy;  Laterality: N/A;   CYSTOSCOPY W/ URETERAL STENT PLACEMENT Left 10/27/2021   Procedure: CYSTOSCOPY WITH RETROGRADE PYELOGRAM/URETERAL STENT PLACEMENT;  Surgeon: Raynelle Bring, MD;  Location: [facility];  Service: Urology;  Laterality: Left;   ESOPHAGOGASTRODUODENOSCOPY N/A 05/20/2021   Procedure: ESOPHAGOGASTRODUODENOSCOPY (EGD);  Surgeon: Otis Brace, MD;  Location: [facility];  Service: Gastroenterology;  Laterality: N/A;   EUS N/A 09/24/2015   Procedure: UPPER ENDOSCOPIC ULTRASOUND (EUS) LINEAR;  Surgeon: Milus Banister, MD;  Location: [facility];   Service: Endoscopy;  Laterality: N/A;   FLEXIBLE SIGMOIDOSCOPY N/A 05/20/2021   Procedure: FLEXIBLE SIGMOIDOSCOPY;  Surgeon: Otis Brace, MD;  Location: [facility];  Service: Gastroenterology;  Laterality: N/A;   FLEXIBLE SIGMOIDOSCOPY N/A 05/26/2021   Procedure: FLEXIBLE SIGMOIDOSCOPY;  Surgeon: Clarene Essex, MD;  Location: [facility];  Service: Endoscopy;  Laterality: N/A;   IR NEPHROSTOMY EXCHANGE LEFT  12/23/2021   IR NEPHROSTOMY EXCHANGE LEFT  01/17/2022   IR NEPHROSTOMY PLACEMENT LEFT  11/11/2021   IR NEPHROSTOMY PLACEMENT RIGHT  11/11/2021   LAPAROSCOPIC DISTAL PANCREATECTOMY  10/2017   [facility]   LAPAROSCOPIC PARTIAL GASTRECTOMY  10/2017   [facility]   LAPAROSCOPIC SIGMOID COLECTOMY N/A 06/14/2021   Procedure: LAPAROSCOPY WITH MOBILIZATION, PROXIMAL DIVERTING COLOSTOMY, CREATION OF DISTAL MUCOUS FISTULA;  Surgeon: Johnathan Hausen, MD;  Location: [facility];  Service: General;  Laterality: N/A;   LAPAROSCOPIC SPLENECTOMY  10/2017   [facility]   VASECTOMY      Social History   Socioeconomic History   Marital status: Married    Spouse name: Not on file   Number of children: 2   Years of education: Not on file   Highest education level: Not on file  Occupational History   Occupation: retired  Tobacco Use   Smoking status: Never   Smokeless tobacco: Never  Vaping Use   Vaping Use: Never used  Substance and Sexual Activity   Alcohol use: No    Alcohol/week: 0.0 standard drinks   Drug use: No   Sexual activity: Yes  Other Topics Concern   Not on file  Social History Narrative   Not on file   Social Determinants of Health   Financial Resource Strain: Not on file  Food Insecurity: Not on file  Transportation Needs: Not on file  Physical Activity: Not on file  Stress: Not on file  Social Connections: Not on file  Intimate Partner Violence: Not on file    Family History  Problem Relation Age of Onset   Stroke Mother     Current Facility-Administered Medications   Medication Dose Route Frequency Provider Last Rate Last Admin   0.9 %  sodium chloride infusion   Intravenous PRN Marcelyn Bruins, MD   Stopped at 01/28/22 2301   acetaminophen (TYLENOL) tablet 650 mg  650 mg Oral Q6H PRN Marcelyn Bruins, MD   650 mg at 01/30/22 0058   Or   acetaminophen (TYLENOL) suppository 650 mg  650 mg Rectal Q6H PRN Marcelyn Bruins, MD       cefTRIAXone (ROCEPHIN) 1 g in sodium chloride 0.9 % 100 mL IVPB  1 g Intravenous Q24H Marcelyn Bruins, MD   Stopped at 01/30/22 1059   Chlorhexidine Gluconate Cloth 2 % PADS 6 each  6 each Topical Daily Marcelyn Bruins, MD   6 each at 01/30/22 1026   enoxaparin (LOVENOX) injection 40 mg  40 mg Subcutaneous Q24H Ennever, Rudell Cobb, MD       feeding supplement (BOOST / RESOURCE BREEZE) liquid 1 Container  1 Container Oral Q24H Donne Hazel, MD       feeding supplement (ENSURE ENLIVE / ENSURE PLUS) liquid 237 mL  237 mL Oral Q24H Donne Hazel, MD   237 mL at 01/30/22 1325   insulin aspart (novoLOG) injection 0-9 Units  0-9 Units Subcutaneous Q4H Dimple Nanas, RPH   1 Units at 01/31/22 0010   oxyCODONE (Oxy IR/ROXICODONE) immediate release tablet 5 mg  5 mg Oral Q4H PRN Donne Hazel, MD   5 mg at 01/30/22 2155   polyethylene glycol (MIRALAX / GLYCOLAX) packet 17 g  17 g Oral BID  PRN Rolm Bookbinder, MD       sodium chloride flush (NS) 0.9 % injection 10-40 mL  10-40 mL Intracatheter PRN Donne Hazel, MD       sodium chloride flush (NS) 0.9 % injection 3 mL  3 mL Intravenous Q12H Marcelyn Bruins, MD   3 mL at 01/30/22 1026   TPN ADULT (ION)   Intravenous Continuous TPN Dimple Nanas, RPH 35 mL/hr at 01/31/22 0221 Infusion Verify at 01/31/22 0221     Allergies  Allergen Reactions   Pollen Extract Other (See Comments)    Sinus congestion    BP (!) 126/95 (BP Location: Left Arm)    Pulse 74    Temp (!) 97.4 F (36.3 C) (Oral)    Resp 18    Ht 5\' 11"  (1.803 m)    Wt 58.2 kg    SpO2 95%     BMI 17.90 kg/m   CT ABDOMEN PELVIS WO CONTRAST  Result Date: 01/28/2022 CLINICAL DATA:  Abdominal pain. Suspicion of bowel obstruction. History of colostomy EXAM: CT ABDOMEN AND PELVIS WITHOUT CONTRAST TECHNIQUE: Multidetector CT imaging of the abdomen and pelvis was performed following the standard protocol without IV contrast. RADIATION DOSE REDUCTION: This exam was performed according to the departmental dose-optimization program which includes automated exposure control, adjustment of the mA and/or kV according to patient size and/or use of iterative reconstruction technique. COMPARISON:  01/07/2022 FINDINGS: Lower chest: Bilateral pulmonary nodules consistent with metastasis. Index right lower lobe 2.2 cm nodule on 12/04 is similar to on the prior. Lingular nodule measures 1.2 cm on 12/04. Loculated left-sided pleural effusion with pleural thickening, similar. Hepatobiliary: Hepatic cysts. Cholecystectomy, without biliary ductal dilatation. Pancreas: Per report partial pancreatectomy. Pancreas poorly evaluated secondary to technique and paucity of retroperitoneal fat. Spleen: Surgically absent Adrenals/Urinary Tract: Normal adrenal glands. Left ureteric stent and nephrostomy catheter in place. Marked right renal atrophy with chronic hydroureteronephrosis. This is followed to an area of persistent distal right ureteric hyperattenuation including on 77/2. Stomach/Bowel: The stomach is decompressed, appears mildly thick walled. A duodenal stent is similar in position, without upstream dilatation. Hartmann's pouch. The remaining sigmoid is thick walled. Descending colostomy. Proximal and mid small bowel loops are fluid-filled and dilated including at up to 4.3 cm on 47/2. 4.9 cm at the same level on the prior. Transition point in the left upper pelvis again suspected, including on 67/2. Distal small bowel loops are decompressed. Normal appendix. Vascular/Lymphatic: Aortic atherosclerosis. No Zaydin Billey  abdominopelvic adenopathy. Reproductive: Mild prostatomegaly. Other: No significant free fluid. No free intraperitoneal air. Right abdominal wall subcutaneous 2.6 cm nodule on 46/2 is similar to on the prior. Suspect developing left chest wall/intercostal metastasis, as evidenced by soft tissue fullness on 01/02. Musculoskeletal: Bilateral hip osteoarthritis. Lumbosacral spondylosis. IMPRESSION: 1. Degraded exam, secondary to lack of oral or IV contrast. 2. Since 01/07/2022, redemonstration of high-grade partial small-bowel obstruction with transition point in the upper left pelvis psych: likely due to an adhesion. 3. Pulmonary metastasis with loculated left-sided pleural effusion and probable anterior left chest wall intercostal metastasis. 4. Right renal atrophy and hydroureteronephrosis. Possible distal right ureteric stones versus retained contrast. 5. Similar right abdominal wall soft tissue nodule, suspicious for metastatic disease. Electronically Signed   By: Abigail Miyamoto M.D.   On: 01/28/2022 18:05   CT ABDOMEN PELVIS WO CONTRAST  Result Date: 01/07/2022 CLINICAL DATA:  Abdominal pain.  History of colostomy. EXAM: CT ABDOMEN AND PELVIS WITHOUT CONTRAST TECHNIQUE: Multidetector CT imaging of the abdomen  and pelvis was performed following the standard protocol without IV contrast. RADIATION DOSE REDUCTION: This exam was performed according to the departmental dose-optimization program which includes automated exposure control, adjustment of the mA and/or kV according to patient size and/or use of iterative reconstruction technique. COMPARISON:  12/09/2021 FINDINGS: Lower chest: Pulmonary nodules again noted in the lung bases. 14 mm nodule right lower lobe on image 5/series 4 is stable in the interval. Similar appearance small left pleural effusion. Hepatobiliary: Scattered hypoattenuating lesions in the liver parenchyma are similar to prior and incompletely characterized on noncontrast imaging. Gallbladder  surgically absent. No intrahepatic or extrahepatic biliary dilation. Pancreas: Status post distal pancreatectomy. Spleen: Status post splenectomy. Adrenals/Urinary Tract: No adrenal nodule or mass. Right renal atrophy again noted with right-sided hydroureteronephrosis. Double-J internal ureteral stent noted left kidney with associated percutaneous nephrostomy tube in the left kidney. Bladder is decompressed. Stomach/Bowel: Stomach is decompressed. Duodenum is normally positioned as is the ligament of Treitz. Distal duodenal stent is similar to prior. Small bowel loops are more dilated on today's study with fluid-filled small bowel dilated up to 4.8 cm diameter in the mid abdomen deep to the umbilicus. Abrupt transition zone is not clearly demonstrated but appears to be in the left pelvis, similar in location to the prior study (axial 64/2). Distal small bowel loops of the pelvis are decompressed. Terminal ileum not well seen. The appendix is not well visualized, but there is no edema or inflammation in the region of the cecum. Left abdominal sigmoid end colostomy noted with Hartmann's pouch anatomy. Vascular/Lymphatic: There is mild atherosclerotic calcification of the abdominal aorta without aneurysm. There is no gastrohepatic or hepatoduodenal ligament lymphadenopathy. No retroperitoneal or mesenteric lymphadenopathy. No pelvic sidewall lymphadenopathy. Reproductive: The prostate gland and seminal vesicles are unremarkable. Other: No intraperitoneal free fluid. Musculoskeletal: No worrisome lytic or sclerotic osseous abnormality. Stable appearance of soft tissue nodules anterior right abdominal wall. IMPRESSION: 1. Similar fluid-filled dilatation of proximal and mid small bowel with transition zone identified in the left pelvis. Imaging features suspicious for small bowel obstruction secondary to adhesion. 2. Left abdominal and sigmoid colostomy with Hartmann's pouch anatomy. 3. Left percutaneous nephrostomy tube  with double-J left internal ureteral stent in place. 4. Right renal atrophy with right-sided hydroureteronephrosis, stable. 5. Stable appearance of small left pleural effusion. 6. Stable appearance of bilateral pulmonary nodules compatible with metastatic disease. 7. Status post distal pancreatectomy and splenectomy. 8. Aortic Atherosclerosis (ICD10-I70.0). Electronically Signed   By: Misty Haren M.D.   On: 01/07/2022 16:29   DG CHEST PORT 1 VIEW  Result Date: 01/07/2022 CLINICAL DATA:  COVID. EXAM: PORTABLE CHEST 1 VIEW COMPARISON:  Chest x-ray 06/11/2021. CT abdomen and pelvis 01/07/2022. CT 11/26/2021. FINDINGS: Right chest port catheter tip projects over the mid SVC. There is a moderate-sized left pleural effusion which may be loculated near the apex. Loculation may be new from prior study. Bilateral pulmonary nodules are again seen which appear similar to prior CT 11/26/2021. No new focal lung infiltrate. No pneumothorax. Cardiomediastinal silhouette appears stable. Heart is enlarged aorta is ectatic. No acute fractures are seen. IMPRESSION: 1. Moderate left pleural effusion is likely partially loculated in the upper hemithorax. Loculation may be new from prior. 2. Grossly unchanged bilateral pulmonary nodules. 3. No new focal lung infiltrate. Electronically Signed   By: Ronney Asters M.D.   On: 01/07/2022 21:56   DG Abd Portable 1V-Small Bowel Obstruction Protocol-24 hr delay  Result Date: 01/08/2022 CLINICAL DATA:  Small-bowel obstruction, 24 hour delay  image. EXAM: PORTABLE ABDOMEN - 1 VIEW COMPARISON:  01/08/2022. FINDINGS: Distended loops of small bowel are noted in the left abdomen measuring up to 3.7 cm in diameter. There is associated fold thickening. A percutaneous nephrostomy tube terminates in the left upper quadrant. Contrast is present in the colon. Suture material is noted in the left lower quadrant. A left ureteral stent is in place. A stent is present in the region of the distal  duodenum. No acute osseous abnormality. IMPRESSION: Persistent distended small bowel with fold thickening measuring up to 3.7 cm in diameter, slightly improved from the prior exam. Electronically Signed   By: Brett Fairy M.D.   On: 01/08/2022 23:59   DG Abd Portable 1V-Small Bowel Obstruction Protocol-initial, 8 hr delay  Result Date: 01/08/2022 CLINICAL DATA:  Small-bowel obstruction follow-up EXAM: PORTABLE ABDOMEN - 1 VIEW COMPARISON:  CT abdomen and pelvis 01/07/2022 FINDINGS: Abnormal gas-filled distended loops of small bowel in the mid and upper abdomen measuring up to 4.9 cm in diameter. The colon is normal caliber. No suspicious calcifications identified. Cholecystectomy clips. Left ureteral stent and left percutaneous nephrostomy tube. IMPRESSION: Persistent abnormal distended small bowel suggesting obstruction. Electronically Signed   By: Ofilia Neas M.D.   On: 01/08/2022 10:27   US BIOPSY (ABDOMINAL RETROPERTIONEAL MASS)  Result Date: 01/10/2022 INDICATION: 81 year old male referred for biopsy of abdominal wall mass, likely metastatic disease EXAM: ULTRASOUND-GUIDED BIOPSY SOFT TISSUE MASS MEDICATIONS: None. ANESTHESIA/SEDATION: Moderate (conscious) sedation was employed during this procedure. A total of Versed 2.0 mg and Fentanyl 100 mcg was administered intravenously by the radiology nurse. Total intra-service moderate Sedation Time: 10 minutes. The patient's level of consciousness and vital signs were monitored continuously by radiology nursing throughout the procedure under my direct supervision. FLUOROSCOPY TIME:  None COMPLICATIONS: None PROCEDURE: Informed written consent was obtained from the patient after a thorough discussion of the procedural risks, benefits and alternatives. All questions were addressed. Maximal Sterile Barrier Technique was utilized including caps, mask, sterile gowns, sterile gloves, sterile drape, hand hygiene and skin antiseptic. A timeout was performed  prior to the initiation of the procedure. Ultrasound survey was performed with images stored and sent to PACs. The anterior right abdominal wall was prepped with chlorhexidine in a sterile fashion, and a sterile drape was applied covering the operative field. A sterile gown and sterile gloves were used for the procedure. Local anesthesia was provided with 1% Lidocaine. Ultrasound guidance was used to infiltrate the region with 1% lidocaine for local anesthesia. Using ultrasound guidance, four separate 18 gauge core biopsy were then acquired of the heterogeneously hypoechoic pathologic nodule using ultrasound guidance. Images were stored. Final image was stored after biopsy. Patient tolerated the procedure well and remained hemodynamically stable throughout. No complications were encountered and no significant blood loss was encounter IMPRESSION: Status post ultrasound-guided biopsy of right abdominal wall mass, likely metastatic disease. Signed, Dulcy Fanny. Dellia Nims, RPVI [facility] Electronically Signed   By: Corrie Mckusick D.O.   On: 01/10/2022 13:14   IR NEPHROSTOMY EXCHANGE LEFT  Result Date: 01/17/2022 INDICATION: 81 year old male with metastatic pancreatic cancer and left ureteral obstruction. He has an indwelling percutaneous nephrostomy tube which accidentally became fractured last night resulting in leakage. He presents for tube exchange. EXAM: Nephrostomy tube exchange COMPARISON:  None. MEDICATIONS: None. ANESTHESIA/SEDATION: None. CONTRAST:  None. FLUOROSCOPY TIME:  Fluoroscopy Time: 0 minutes 24 seconds (2 mGy). COMPLICATIONS: None immediate. PROCEDURE: Informed written consent was obtained from the patient after a  thorough discussion of the procedural risks, benefits and alternatives. All questions were addressed. Maximal Sterile Barrier Technique was utilized including caps, mask, sterile gowns, sterile gloves, sterile drape, hand hygiene  and skin antiseptic. A timeout was performed prior to the initiation of the procedure. The existing tube remains well formed and overlies the superior aspect of the indwelling double-J ureteral stent confirming its presence within the renal pelvis. The tube was transected and removed over an Amplatz wire. A new Cook 10.2 [place] all-purpose drain was advanced over the wire and formed in the renal pelvis. There was return of urine. The catheter was secured to the skin with 0 Prolene suture and connected to gravity bag drainage. IMPRESSION: Successful exchange for a new 10.2 French percutaneous nephrostomy tube. Electronically Signed   By: Jacqulynn Cadet M.D.   On: 01/17/2022 16:19

## 2022-01-31 NOTE — H&P (View-Only) (Signed)
Note: Portions of this report may have been transcribed using voice recognition software. Every effort was made to ensure accuracy; however, inadvertent computerized transcription errors may be present.   Any transcriptional errors that result from this process are unintentional.              Kevin Villegas  [birth year] [id number]  Patient Care Team: Dettinger, [name], MD as PCP - General (Family Medicine) Mariah Milling, Karyl Kinnier., MD as Consulting Physician (Surgical Oncology) Volanda Napoleon, MD as Medical Oncologist (Oncology) Cordelia Poche, RN as Oncology Nurse Navigator   Subjective/Chief Complaint: Patient sitting up with full liquid tray.  Trying a few sips and a few spoonfuls of cream week.  Passing some gas but gets bloated very easily.  He wished to explain his past medical history and prior consultations.     Objective: Vital signs in last 24 hours: Temp:  [97.4 F (36.3 C)-98.3 F (36.8 C)] 97.4 F (36.3 C) (02/13 0402) Pulse Rate:  [61-74] 74 (02/13 0402) Resp:  [16-18] 18 (02/13 0402) BP: (126-144)/(85-99) 126/95 (02/13 0402) SpO2:  [94 %-98 %] 95 % (02/13 0402) Last BM Date: 01/29/22 (minimal output in ostomy)   Intake/Output from previous day: 02/12 0701 - 02/13 0700 In: 380 [I.V.:280; IV Piggyback:100] Out: 650 [Urine:650] Intake/Output this shift: No intake/output data recorded.   PE:  Constitutional: Not cachectic.  Hygeine adequate.  Vitals signs as above.   Eyes: Pupils reactive, normal extraocular movements. Sclera nonicteric Neuro: CN II-XII intact.  No major focal sensory defects.  No major motor deficits. Lymph: No head/neck/groin lymphadenopathy Psych:  No severe agitation.  No severe anxiety.  Judgment & insight Adequate, Oriented x4, HENT: Normocephalic, Mucus membranes moist.  No thrush.   Neck: Supple, No tracheal deviation.  No obvious thyromegaly Chest: No pain to chest wall compression.  Good respiratory excursion.  No audible  wheezing CV:  Pulses intact.   Regular rhythm.  No major extremity edema  Abdomen:  Flat Hernia: Not present. Diastasis recti: Not present. Soft.   Mildly distended.  Nontender.  No hepatomegaly.  No splenomegaly.  He is somewhat thin-walled.  In right subcostal ridge is a 4 x 3 cm subcutaneous nodule.  I feel a smaller 1 to 2 x 2 cm near the right lower quadrant.  These correlate with a possible abdominal wall mets.  Left-sided colostomy pink with scant gas and flatus in bag  Gen:  Inguinal hernia: Not present.  Inguinal lymph nodes: without lymphadenopathy.    Rectal: (Deferred)  Ext: No obvious deformity or contracture.  Edema: Not present.  No cyanosis Skin: No major subcutaneous nodules.  Warm and dry Musculoskeletal: Severe joint rigidity not present.  No obvious clubbing.  No digital petechiae.   ensation is normal throughout Psych: A&Ox3 with an appropriate affect.      Lab Results:  Recent Labs (last 2 labs)       Recent Labs    01/29/22 0446 01/30/22 0500  WBC 12.7* 14.0*  HGB 11.9* 11.7*  HCT 38.4* 36.8*  PLT 610* 589*       BMET Recent Labs (last 2 labs)       Recent Labs    01/30/22 0500 01/31/22 0507  NA 138 136  K 4.3 4.4  CL 108 106  CO2 24 24  GLUCOSE 102* 130*  BUN 26* 22  CREATININE 1.23 0.97  CALCIUM 8.5* 8.7*       PT/INR Recent Labs (last 2 labs)   No results  for input(s): LABPROT, INR in the last 72 hours.   ABG  Recent Labs (last 2 labs)   No results for input(s): PHART, HCO3 in the last 72 hours.   Invalid input(s): PCO2, PO2     Studies/Results: Imaging Results (Last 48 hours)  No results found.     Anti-infectives: Anti-infectives (From admission, onward)        Start     Dose/Rate Route Frequency Ordered Stop    01/30/22 1000   cefTRIAXone (ROCEPHIN) 1 g in sodium chloride 0.9 % 100 mL IVPB        1 g 200 mL/hr over 30 Minutes Intravenous Every 24 hours 01/29/22 0013      01/28/22 1830   cefTRIAXone (ROCEPHIN) 1 g in  sodium chloride 0.9 % 100 mL IVPB        1 g 200 mL/hr over 30 Minutes Intravenous  Once 01/28/22 1828 01/28/22 1927             Assessment/Plan: Stage IV pancreatic cancer with psbo  Patient clearly has metastatic disease in his lung and abdominal wall and original site.  However they seem relatively stable.  There is no definite metastasis at the site of probable obstruction in the left pelvis.  It is reasonable to attempt diagnostic laparoscopy with lyse adhesions in the hope of freeing off some benign adhesions other source of his obstruction.  Could be that there is metastatic disease at this site.  If it is very isolated perhaps I can do a small bowel resection to control this.  If there is evidence of carcinomatosis and diffuse metastatic disease, then I would be very reluctant to proceed with any bowel resection as most likely it would fail break apart and he did end up with a painful leak and enterocutaneous fistula.  He understands that his situation is very difficult.  He wishes to be aggressive since he has decent performance status and quality of life and is hoping that this can be straightforwardly fixed.  I do worry that his insight into his metastatic disease is a little more dismissive than what truly is going on.  However according to Dr. Marin Olp, it does not look like the cancer is rapidly growing.  So perhaps there is some palliative chemotherapy options that may be of use.  If I can get rid of the abdominal wall masses at the time of surgery, I might try.  However I do not want to make larger incisions if not needed and the fact that these developed after the last operation in less than a year makes me concerned about new issues.  He has metastatic disease elsewhere so most likely will try and keep it simple.  He understands he may not be able to successfully do that and he will switch to palliation if needed.  Sometimes I would consider a gastrostomy tube but given the fact  most of his stomach has been resected, I do not know if that is a good option.  He has already had some breakfast of full liquid food.  We will set this up for tomorrow, 2/14.  Spent extra time with the patient and allowed him to express his concerns.  He wanted to make sure that he felt comfortable with me.  He expressed appreciation for Dr. Donne Hazel, Watt Climes, nursing and the rest the team.  Discussed with Dr. Wyline Copas who is about coming to the room and discuss as well.  Patient calling to update his wife  as well.  The anatomy & physiology of the digestive tract was discussed.  The pathophysiology of intestinal obstruction was discussed.  Natural history risks without surgery was discussed.   I feel the patient has failed non-operative therapies.  The risks of no intervention will lead to serious problems such as necrosis, perforation, dehydration, etc. that outweigh the operative risks; therefore, I recommended abdominal exploration to diagnose & treat the source of the problem.  Minimally Invasive & open techniques were discussed.   I expressed a good likelihood that surgery will treat the problem.  Risks such as bleeding, infection, abscess, leak, reoperation, bowel resection, possible ostomy, hernia, injury to other organs, need for repair of tissues / organs, need for further treatment, heart attack, death, and other risks were discussed.   I noted a good likelihood this will help address the problem.  Goals of post-operative recovery were discussed as well.  We will work to minimize complications. Questions were answered.  The patient expresses understanding & wishes to proceed with surgery.   FEN: Moderate malnutrition but not severe cachexia., TPN start 2/12 ID: VTE:   I reviewed hospitalist notes, last 24 h vitals and pain scores, last 48 h intake and output, last 24 h labs and trends, and last 24 h imaging results.   This care required high  level of medical decision making.       Narrative & Impression  CLINICAL DATA:  Subsequent treatment strategy for metastatic pancreatic carcinoma. Previous distal pancreatectomy.   EXAM: NUCLEAR MEDICINE PET SKULL BASE TO THIGH   TECHNIQUE: 7.8 mCi F-18 FDG was injected intravenously. Full-ring PET imaging was performed from the skull base to thigh after the radiotracer. CT data was obtained and used for attenuation correction and anatomic localization.   Fasting blood glucose: 86 mg/dl   COMPARISON:  Noncontrast AP CT on 11/09/2021   FINDINGS: Mediastinal blood-pool activity (background): SUV max = 2.3   Liver activity (reference): SUV max = N/A   NECK:  No hypermetabolic lymph nodes or masses.   Incidental CT findings:  None.   CHEST: Numerous hypermetabolic pulmonary nodules are seen throughout both lungs, consistent with diffuse pulmonary metastases. Several show mild increase in size compared to those seen on prior abdomen CT. An index nodule in the posterior right lower lobe measures 2.1 x 1.9 cm on image 49/10, with SUV 5.9.   Small left pleural effusion is increased in size. Numerous sites of FDG uptake are seen within left pleural space which correspond with areas of mild pleural thickening. Index lesion the anterior left pleural space has SUV max of 12.8, and these are consistent with diffuse pleural metastatic disease with small malignant pleural effusion.   Incidental CT findings:  None.   ABDOMEN/PELVIS: Soft tissue mass in the area of the pancreatic body along the medial aspect of the duodenal stent currently measures 3.3 x 2.7 cm on image 129/4, slightly decreased from 3.8 x 3.0 cm previously. This shows hypermetabolic activity, with SUV max of 9.9, consistent with recurrent carcinoma.   No abnormal hypermetabolic activity within the liver, adrenal glands, or spleen. No hypermetabolic lymph nodes in the abdomen or pelvis. Poorly defined soft tissue density is seen in the right anterior  abdominal wall measuring 2.4 x 2.2 cm on image 139/, which is hypermetabolic, with SUV max of 7 6. A smaller asymmetric soft tissue density is seen in the right lower quadrant abdominal wall soft tissues measuring 1.9 x 1.5 cm on image 154/4, which also shows  FDG uptake, with SUV max of 3.6. These are not significantly changed compared to recent CT, and consistent with abdominal wall metastases.   Incidental CT findings: Several small fluid attenuation hepatic cysts are again seen, which show no FDG uptake. Chronic right renal parenchymal atrophy and hydroureteronephrosis show no significant change, with persistent 5 mm calculus in the distal right ureter. New left percutaneous nephrostomy tube is seen in appropriate position, and double pigtail internal left ureteral stent remains in place, with resolution of left hydronephrosis since prior exam. Stable mildly enlarged prostate.   SKELETON: No focal hypermetabolic bone lesions to suggest skeletal metastasis.   Incidental CT findings:  None.   IMPRESSION: Diffuse bilateral pulmonary metastases, some in the lung bases showing mild increase in size compared to recent abdomen CT.   Increased size of small malignant left pleural effusion, with diffuse pleural metastatic disease.   Slight decrease in size of hypermetabolic soft tissue mass in region of pancreatic body, consistent with recurrent carcinoma.   Stable size of 2 small hypermetabolic soft tissue densities in the right anterior abdominal wall soft tissues, consistent with metastatic disease.   Resolution of left hydronephrosis following percutaneous nephrostomy tube placement.     Electronically Signed   By: Marlaine Hind M.D.   On: 11/29/2021 09:19    Patient Active Problem List   Diagnosis Date Noted   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  with metastatic disease 06/27/2019    Priority: High   SBO (small bowel obstruction) ([place]) 01/28/2022    Subcutaneous nodule of abdominal wall 01/09/2022   Partial small bowel obstruction ([place]) 01/07/2022   COVID-19 virus infection 01/07/2022   Colostomy in place [facility]) 11/10/2021   Stage 3a chronic kidney disease (CKD) ([place]) 11/10/2021   Pyelonephritis    Essential hypertension 10/30/2021   Hydronephrosis of left kidney 10/28/2021   Ureteral stricture, left 10/27/2021   Protein-calorie malnutrition, severe 06/12/2021   Hydroureter on right 06/10/2021   Aortic atherosclerosis ([place]) 06/10/2021   Right ureteral stone 06/02/2021   Colon stricture ([place])    Pancreatic mass 06/27/2019   Rhinitis medicamentosa 02/26/2019    Past Medical History:  Diagnosis Date   Colostomy present ([place])    Large bowel obstruction ([place])    Pancreatic cancer ([place])     Past Surgical History:  Procedure Laterality Date   ABDOMINAL SURGERY     BIOPSY  05/20/2021   Procedure: BIOPSY;  Surgeon: Otis Brace, MD;  Location: [facility];  Service: Gastroenterology;;   BOWEL RESECTION     BOWEL RESECTION     CHOLECYSTECTOMY N/A 12/16/2014   Procedure: LAPAROSCOPIC CHOLECYSTECTOMY WITH INTRAOPERATIVE CHOLANGIOGRAM;  Surgeon: Georganna Skeans, MD;  Location: [place];  Service: General;  Laterality: N/A;   COLONIC STENT PLACEMENT N/A 05/26/2021   Procedure: COLONIC STENT PLACEMENT;  Surgeon: Clarene Essex, MD;  Location: [facility];  Service: Endoscopy;  Laterality: N/A;   CYSTOSCOPY W/ URETERAL STENT PLACEMENT Left 10/27/2021   Procedure: CYSTOSCOPY WITH RETROGRADE PYELOGRAM/URETERAL STENT PLACEMENT;  Surgeon: Raynelle Bring, MD;  Location: [facility];  Service: Urology;  Laterality: Left;   ESOPHAGOGASTRODUODENOSCOPY N/A 05/20/2021   Procedure: ESOPHAGOGASTRODUODENOSCOPY (EGD);  Surgeon: Otis Brace, MD;  Location: [facility];  Service: Gastroenterology;  Laterality: N/A;   EUS N/A 09/24/2015   Procedure: UPPER ENDOSCOPIC ULTRASOUND (EUS) LINEAR;  Surgeon: Milus Banister, MD;  Location: [facility];   Service: Endoscopy;  Laterality: N/A;   FLEXIBLE SIGMOIDOSCOPY N/A 05/20/2021   Procedure: FLEXIBLE SIGMOIDOSCOPY;  Surgeon: Otis Brace, MD;  Location: [facility];  Service: Gastroenterology;  Laterality: N/A;   FLEXIBLE SIGMOIDOSCOPY N/A 05/26/2021   Procedure: FLEXIBLE SIGMOIDOSCOPY;  Surgeon: Clarene Essex, MD;  Location: [facility];  Service: Endoscopy;  Laterality: N/A;   IR NEPHROSTOMY EXCHANGE LEFT  12/23/2021   IR NEPHROSTOMY EXCHANGE LEFT  01/17/2022   IR NEPHROSTOMY PLACEMENT LEFT  11/11/2021   IR NEPHROSTOMY PLACEMENT RIGHT  11/11/2021   LAPAROSCOPIC DISTAL PANCREATECTOMY  10/2017   [facility]   LAPAROSCOPIC PARTIAL GASTRECTOMY  10/2017   [facility]   LAPAROSCOPIC SIGMOID COLECTOMY N/A 06/14/2021   Procedure: LAPAROSCOPY WITH MOBILIZATION, PROXIMAL DIVERTING COLOSTOMY, CREATION OF DISTAL MUCOUS FISTULA;  Surgeon: Johnathan Hausen, MD;  Location: [facility];  Service: General;  Laterality: N/A;   LAPAROSCOPIC SPLENECTOMY  10/2017   [facility]   VASECTOMY      Social History   Socioeconomic History   Marital status: Married    Spouse name: Not on file   Number of children: 2   Years of education: Not on file   Highest education level: Not on file  Occupational History   Occupation: retired  Tobacco Use   Smoking status: Never   Smokeless tobacco: Never  Vaping Use   Vaping Use: Never used  Substance and Sexual Activity   Alcohol use: No    Alcohol/week: 0.0 standard drinks   Drug use: No   Sexual activity: Yes  Other Topics Concern   Not on file  Social History Narrative   Not on file   Social Determinants of Health   Financial Resource Strain: Not on file  Food Insecurity: Not on file  Transportation Needs: Not on file  Physical Activity: Not on file  Stress: Not on file  Social Connections: Not on file  Intimate Partner Violence: Not on file    Family History  Problem Relation Age of Onset   Stroke Mother     Current Facility-Administered Medications   Medication Dose Route Frequency Provider Last Rate Last Admin   0.9 %  sodium chloride infusion   Intravenous PRN Marcelyn Bruins, MD   Stopped at 01/28/22 2301   acetaminophen (TYLENOL) tablet 650 mg  650 mg Oral Q6H PRN Marcelyn Bruins, MD   650 mg at 01/30/22 0058   Or   acetaminophen (TYLENOL) suppository 650 mg  650 mg Rectal Q6H PRN Marcelyn Bruins, MD       cefTRIAXone (ROCEPHIN) 1 g in sodium chloride 0.9 % 100 mL IVPB  1 g Intravenous Q24H Marcelyn Bruins, MD   Stopped at 01/30/22 1059   Chlorhexidine Gluconate Cloth 2 % PADS 6 each  6 each Topical Daily Marcelyn Bruins, MD   6 each at 01/30/22 1026   enoxaparin (LOVENOX) injection 40 mg  40 mg Subcutaneous Q24H Ennever, Rudell Cobb, MD       feeding supplement (BOOST / RESOURCE BREEZE) liquid 1 Container  1 Container Oral Q24H Donne Hazel, MD       feeding supplement (ENSURE ENLIVE / ENSURE PLUS) liquid 237 mL  237 mL Oral Q24H Donne Hazel, MD   237 mL at 01/30/22 1325   insulin aspart (novoLOG) injection 0-9 Units  0-9 Units Subcutaneous Q4H Dimple Nanas, RPH   1 Units at 01/31/22 0010   oxyCODONE (Oxy IR/ROXICODONE) immediate release tablet 5 mg  5 mg Oral Q4H PRN Donne Hazel, MD   5 mg at 01/30/22 2155   polyethylene glycol (MIRALAX / GLYCOLAX) packet 17 g  17 g Oral BID  PRN Rolm Bookbinder, MD       sodium chloride flush (NS) 0.9 % injection 10-40 mL  10-40 mL Intracatheter PRN Donne Hazel, MD       sodium chloride flush (NS) 0.9 % injection 3 mL  3 mL Intravenous Q12H Marcelyn Bruins, MD   3 mL at 01/30/22 1026   TPN ADULT (ION)   Intravenous Continuous TPN Dimple Nanas, RPH 35 mL/hr at 01/31/22 0221 Infusion Verify at 01/31/22 0221     Allergies  Allergen Reactions   Pollen Extract Other (See Comments)    Sinus congestion    BP (!) 126/95 (BP Location: Left Arm)    Pulse 74    Temp (!) 97.4 F (36.3 C) (Oral)    Resp 18    Ht 5\' 11"  (1.803 m)    Wt 58.2 kg    SpO2 95%     BMI 17.90 kg/m   CT ABDOMEN PELVIS WO CONTRAST  Result Date: 01/28/2022 CLINICAL DATA:  Abdominal pain. Suspicion of bowel obstruction. History of colostomy EXAM: CT ABDOMEN AND PELVIS WITHOUT CONTRAST TECHNIQUE: Multidetector CT imaging of the abdomen and pelvis was performed following the standard protocol without IV contrast. RADIATION DOSE REDUCTION: This exam was performed according to the departmental dose-optimization program which includes automated exposure control, adjustment of the mA and/or kV according to patient size and/or use of iterative reconstruction technique. COMPARISON:  01/07/2022 FINDINGS: Lower chest: Bilateral pulmonary nodules consistent with metastasis. Index right lower lobe 2.2 cm nodule on 12/04 is similar to on the prior. Lingular nodule measures 1.2 cm on 12/04. Loculated left-sided pleural effusion with pleural thickening, similar. Hepatobiliary: Hepatic cysts. Cholecystectomy, without biliary ductal dilatation. Pancreas: Per report partial pancreatectomy. Pancreas poorly evaluated secondary to technique and paucity of retroperitoneal fat. Spleen: Surgically absent Adrenals/Urinary Tract: Normal adrenal glands. Left ureteric stent and nephrostomy catheter in place. Marked right renal atrophy with chronic hydroureteronephrosis. This is followed to an area of persistent distal right ureteric hyperattenuation including on 77/2. Stomach/Bowel: The stomach is decompressed, appears mildly thick walled. A duodenal stent is similar in position, without upstream dilatation. Hartmann's pouch. The remaining sigmoid is thick walled. Descending colostomy. Proximal and mid small bowel loops are fluid-filled and dilated including at up to 4.3 cm on 47/2. 4.9 cm at the same level on the prior. Transition point in the left upper pelvis again suspected, including on 67/2. Distal small bowel loops are decompressed. Normal appendix. Vascular/Lymphatic: Aortic atherosclerosis. No Dinorah Masullo  abdominopelvic adenopathy. Reproductive: Mild prostatomegaly. Other: No significant free fluid. No free intraperitoneal air. Right abdominal wall subcutaneous 2.6 cm nodule on 46/2 is similar to on the prior. Suspect developing left chest wall/intercostal metastasis, as evidenced by soft tissue fullness on 01/02. Musculoskeletal: Bilateral hip osteoarthritis. Lumbosacral spondylosis. IMPRESSION: 1. Degraded exam, secondary to lack of oral or IV contrast. 2. Since 01/07/2022, redemonstration of high-grade partial small-bowel obstruction with transition point in the upper left pelvis psych: likely due to an adhesion. 3. Pulmonary metastasis with loculated left-sided pleural effusion and probable anterior left chest wall intercostal metastasis. 4. Right renal atrophy and hydroureteronephrosis. Possible distal right ureteric stones versus retained contrast. 5. Similar right abdominal wall soft tissue nodule, suspicious for metastatic disease. Electronically Signed   By: Abigail Miyamoto M.D.   On: 01/28/2022 18:05   CT ABDOMEN PELVIS WO CONTRAST  Result Date: 01/07/2022 CLINICAL DATA:  Abdominal pain.  History of colostomy. EXAM: CT ABDOMEN AND PELVIS WITHOUT CONTRAST TECHNIQUE: Multidetector CT imaging of the abdomen  and pelvis was performed following the standard protocol without IV contrast. RADIATION DOSE REDUCTION: This exam was performed according to the departmental dose-optimization program which includes automated exposure control, adjustment of the mA and/or kV according to patient size and/or use of iterative reconstruction technique. COMPARISON:  12/09/2021 FINDINGS: Lower chest: Pulmonary nodules again noted in the lung bases. 14 mm nodule right lower lobe on image 5/series 4 is stable in the interval. Similar appearance small left pleural effusion. Hepatobiliary: Scattered hypoattenuating lesions in the liver parenchyma are similar to prior and incompletely characterized on noncontrast imaging. Gallbladder  surgically absent. No intrahepatic or extrahepatic biliary dilation. Pancreas: Status post distal pancreatectomy. Spleen: Status post splenectomy. Adrenals/Urinary Tract: No adrenal nodule or mass. Right renal atrophy again noted with right-sided hydroureteronephrosis. Double-J internal ureteral stent noted left kidney with associated percutaneous nephrostomy tube in the left kidney. Bladder is decompressed. Stomach/Bowel: Stomach is decompressed. Duodenum is normally positioned as is the ligament of Treitz. Distal duodenal stent is similar to prior. Small bowel loops are more dilated on today's study with fluid-filled small bowel dilated up to 4.8 cm diameter in the mid abdomen deep to the umbilicus. Abrupt transition zone is not clearly demonstrated but appears to be in the left pelvis, similar in location to the prior study (axial 64/2). Distal small bowel loops of the pelvis are decompressed. Terminal ileum not well seen. The appendix is not well visualized, but there is no edema or inflammation in the region of the cecum. Left abdominal sigmoid end colostomy noted with Hartmann's pouch anatomy. Vascular/Lymphatic: There is mild atherosclerotic calcification of the abdominal aorta without aneurysm. There is no gastrohepatic or hepatoduodenal ligament lymphadenopathy. No retroperitoneal or mesenteric lymphadenopathy. No pelvic sidewall lymphadenopathy. Reproductive: The prostate gland and seminal vesicles are unremarkable. Other: No intraperitoneal free fluid. Musculoskeletal: No worrisome lytic or sclerotic osseous abnormality. Stable appearance of soft tissue nodules anterior right abdominal wall. IMPRESSION: 1. Similar fluid-filled dilatation of proximal and mid small bowel with transition zone identified in the left pelvis. Imaging features suspicious for small bowel obstruction secondary to adhesion. 2. Left abdominal and sigmoid colostomy with Hartmann's pouch anatomy. 3. Left percutaneous nephrostomy tube  with double-J left internal ureteral stent in place. 4. Right renal atrophy with right-sided hydroureteronephrosis, stable. 5. Stable appearance of small left pleural effusion. 6. Stable appearance of bilateral pulmonary nodules compatible with metastatic disease. 7. Status post distal pancreatectomy and splenectomy. 8. Aortic Atherosclerosis (ICD10-I70.0). Electronically Signed   By: Misty Odonnell M.D.   On: 01/07/2022 16:29   DG CHEST PORT 1 VIEW  Result Date: 01/07/2022 CLINICAL DATA:  COVID. EXAM: PORTABLE CHEST 1 VIEW COMPARISON:  Chest x-ray 06/11/2021. CT abdomen and pelvis 01/07/2022. CT 11/26/2021. FINDINGS: Right chest port catheter tip projects over the mid SVC. There is a moderate-sized left pleural effusion which may be loculated near the apex. Loculation may be new from prior study. Bilateral pulmonary nodules are again seen which appear similar to prior CT 11/26/2021. No new focal lung infiltrate. No pneumothorax. Cardiomediastinal silhouette appears stable. Heart is enlarged aorta is ectatic. No acute fractures are seen. IMPRESSION: 1. Moderate left pleural effusion is likely partially loculated in the upper hemithorax. Loculation may be new from prior. 2. Grossly unchanged bilateral pulmonary nodules. 3. No new focal lung infiltrate. Electronically Signed   By: Ronney Asters M.D.   On: 01/07/2022 21:56   DG Abd Portable 1V-Small Bowel Obstruction Protocol-24 hr delay  Result Date: 01/08/2022 CLINICAL DATA:  Small-bowel obstruction, 24 hour delay  image. EXAM: PORTABLE ABDOMEN - 1 VIEW COMPARISON:  01/08/2022. FINDINGS: Distended loops of small bowel are noted in the left abdomen measuring up to 3.7 cm in diameter. There is associated fold thickening. A percutaneous nephrostomy tube terminates in the left upper quadrant. Contrast is present in the colon. Suture material is noted in the left lower quadrant. A left ureteral stent is in place. A stent is present in the region of the distal  duodenum. No acute osseous abnormality. IMPRESSION: Persistent distended small bowel with fold thickening measuring up to 3.7 cm in diameter, slightly improved from the prior exam. Electronically Signed   By: Brett Fairy M.D.   On: 01/08/2022 23:59   DG Abd Portable 1V-Small Bowel Obstruction Protocol-initial, 8 hr delay  Result Date: 01/08/2022 CLINICAL DATA:  Small-bowel obstruction follow-up EXAM: PORTABLE ABDOMEN - 1 VIEW COMPARISON:  CT abdomen and pelvis 01/07/2022 FINDINGS: Abnormal gas-filled distended loops of small bowel in the mid and upper abdomen measuring up to 4.9 cm in diameter. The colon is normal caliber. No suspicious calcifications identified. Cholecystectomy clips. Left ureteral stent and left percutaneous nephrostomy tube. IMPRESSION: Persistent abnormal distended small bowel suggesting obstruction. Electronically Signed   By: Ofilia Neas M.D.   On: 01/08/2022 10:27   US BIOPSY (ABDOMINAL RETROPERTIONEAL MASS)  Result Date: 01/10/2022 INDICATION: 81 year old male referred for biopsy of abdominal wall mass, likely metastatic disease EXAM: ULTRASOUND-GUIDED BIOPSY SOFT TISSUE MASS MEDICATIONS: None. ANESTHESIA/SEDATION: Moderate (conscious) sedation was employed during this procedure. A total of Versed 2.0 mg and Fentanyl 100 mcg was administered intravenously by the radiology nurse. Total intra-service moderate Sedation Time: 10 minutes. The patient's level of consciousness and vital signs were monitored continuously by radiology nursing throughout the procedure under my direct supervision. FLUOROSCOPY TIME:  None COMPLICATIONS: None PROCEDURE: Informed written consent was obtained from the patient after a thorough discussion of the procedural risks, benefits and alternatives. All questions were addressed. Maximal Sterile Barrier Technique was utilized including caps, mask, sterile gowns, sterile gloves, sterile drape, hand hygiene and skin antiseptic. A timeout was performed  prior to the initiation of the procedure. Ultrasound survey was performed with images stored and sent to PACs. The anterior right abdominal wall was prepped with chlorhexidine in a sterile fashion, and a sterile drape was applied covering the operative field. A sterile gown and sterile gloves were used for the procedure. Local anesthesia was provided with 1% Lidocaine. Ultrasound guidance was used to infiltrate the region with 1% lidocaine for local anesthesia. Using ultrasound guidance, four separate 18 gauge core biopsy were then acquired of the heterogeneously hypoechoic pathologic nodule using ultrasound guidance. Images were stored. Final image was stored after biopsy. Patient tolerated the procedure well and remained hemodynamically stable throughout. No complications were encountered and no significant blood loss was encounter IMPRESSION: Status post ultrasound-guided biopsy of right abdominal wall mass, likely metastatic disease. Signed, Dulcy Fanny. Dellia Nims, RPVI [facility] Electronically Signed   By: Corrie Mckusick D.O.   On: 01/10/2022 13:14   IR NEPHROSTOMY EXCHANGE LEFT  Result Date: 01/17/2022 INDICATION: 81 year old male with metastatic pancreatic cancer and left ureteral obstruction. He has an indwelling percutaneous nephrostomy tube which accidentally became fractured last night resulting in leakage. He presents for tube exchange. EXAM: Nephrostomy tube exchange COMPARISON:  None. MEDICATIONS: None. ANESTHESIA/SEDATION: None. CONTRAST:  None. FLUOROSCOPY TIME:  Fluoroscopy Time: 0 minutes 24 seconds (2 mGy). COMPLICATIONS: None immediate. PROCEDURE: Informed written consent was obtained from the patient after a  thorough discussion of the procedural risks, benefits and alternatives. All questions were addressed. Maximal Sterile Barrier Technique was utilized including caps, mask, sterile gowns, sterile gloves, sterile drape, hand hygiene  and skin antiseptic. A timeout was performed prior to the initiation of the procedure. The existing tube remains well formed and overlies the superior aspect of the indwelling double-J ureteral stent confirming its presence within the renal pelvis. The tube was transected and removed over an Amplatz wire. A new Cook 10.2 [place] all-purpose drain was advanced over the wire and formed in the renal pelvis. There was return of urine. The catheter was secured to the skin with 0 Prolene suture and connected to gravity bag drainage. IMPRESSION: Successful exchange for a new 10.2 French percutaneous nephrostomy tube. Electronically Signed   By: Jacqulynn Cadet M.D.   On: 01/17/2022 16:19

## 2022-02-01 ENCOUNTER — Encounter (HOSPITAL_COMMUNITY): Payer: Self-pay | Admitting: Internal Medicine

## 2022-02-01 ENCOUNTER — Encounter (HOSPITAL_COMMUNITY)
Admission: EM | Disposition: A | Discharge status: Hospice | Payer: Self-pay | Source: Home / Self Care | Attending: Internal Medicine

## 2022-02-01 ENCOUNTER — Inpatient Hospital Stay (HOSPITAL_COMMUNITY): Payer: Medicare Other | Admitting: Anesthesiology

## 2022-02-01 DIAGNOSIS — N390 Urinary tract infection, site not specified: Secondary | ICD-10-CM

## 2022-02-01 DIAGNOSIS — I10 Essential (primary) hypertension: Secondary | ICD-10-CM | POA: Diagnosis not present

## 2022-02-01 DIAGNOSIS — I129 Hypertensive chronic kidney disease with stage 1 through stage 4 chronic kidney disease, or unspecified chronic kidney disease: Secondary | ICD-10-CM

## 2022-02-01 DIAGNOSIS — K56609 Unspecified intestinal obstruction, unspecified as to partial versus complete obstruction: Secondary | ICD-10-CM | POA: Diagnosis not present

## 2022-02-01 DIAGNOSIS — K565 Intestinal adhesions [bands], unspecified as to partial versus complete obstruction: Secondary | ICD-10-CM

## 2022-02-01 DIAGNOSIS — N183 Chronic kidney disease, stage 3 unspecified: Secondary | ICD-10-CM

## 2022-02-01 DIAGNOSIS — Z8507 Personal history of malignant neoplasm of pancreas: Secondary | ICD-10-CM

## 2022-02-01 DIAGNOSIS — B9620 Unspecified Escherichia coli [E. coli] as the cause of diseases classified elsewhere: Secondary | ICD-10-CM

## 2022-02-01 HISTORY — PX: LAPAROSCOPIC LYSIS OF ADHESIONS: SHX5905

## 2022-02-01 HISTORY — PX: LAPAROSCOPIC SMALL BOWEL RESECTION: SHX5929

## 2022-02-01 LAB — COMPREHENSIVE METABOLIC PANEL
ALT: 20 U/L (ref 0–44)
AST: 29 U/L (ref 15–41)
Albumin: 2.4 g/dL — ABNORMAL LOW (ref 3.5–5.0)
Alkaline Phosphatase: 62 U/L (ref 38–126)
Anion gap: 7 (ref 5–15)
BUN: 27 mg/dL — ABNORMAL HIGH (ref 8–23)
CO2: 27 mmol/L (ref 22–32)
Calcium: 9 mg/dL (ref 8.9–10.3)
Chloride: 100 mmol/L (ref 98–111)
Creatinine, Ser: 1.17 mg/dL (ref 0.61–1.24)
GFR, Estimated: >60 mL/min (ref >60)
Glucose, Bld: 109 mg/dL — ABNORMAL HIGH (ref 70–99)
Potassium: 4.7 mmol/L (ref 3.5–5.1)
Sodium: 134 mmol/L — ABNORMAL LOW (ref 135–145)
Total Bilirubin: 0.2 mg/dL — ABNORMAL LOW (ref 0.3–1.2)
Total Protein: 6.6 g/dL (ref 6.5–8.1)

## 2022-02-01 LAB — MAGNESIUM: Magnesium: 2 mg/dL (ref 1.7–2.4)

## 2022-02-01 LAB — TYPE AND SCREEN
ABO/RH(D): B POS
Antibody Screen: NEGATIVE

## 2022-02-01 LAB — GLUCOSE, CAPILLARY
Glucose-Capillary: 108 mg/dL — ABNORMAL HIGH (ref 70–99)
Glucose-Capillary: 121 mg/dL — ABNORMAL HIGH (ref 70–99)
Glucose-Capillary: 131 mg/dL — ABNORMAL HIGH (ref 70–99)
Glucose-Capillary: 132 mg/dL — ABNORMAL HIGH (ref 70–99)
Glucose-Capillary: 138 mg/dL — ABNORMAL HIGH (ref 70–99)
Glucose-Capillary: 204 mg/dL — ABNORMAL HIGH (ref 70–99)
Glucose-Capillary: 215 mg/dL — ABNORMAL HIGH (ref 70–99)

## 2022-02-01 LAB — PHOSPHORUS: Phosphorus: 3.3 mg/dL (ref 2.5–4.6)

## 2022-02-01 SURGERY — LYSIS, ADHESIONS, LAPAROSCOPIC
Anesthesia: General | Site: Abdomen

## 2022-02-01 MED ORDER — LIDOCAINE 2% (20 MG/ML) 5 ML SYRINGE
INTRAMUSCULAR | Status: DC | PRN
  Administered 2022-02-01: 50 mg via INTRAVENOUS

## 2022-02-01 MED ORDER — MAGIC MOUTHWASH
15.0000 mL | Freq: Four times a day (QID) | ORAL | Status: DC | PRN
  Filled 2022-02-01: qty 15

## 2022-02-01 MED ORDER — ROCURONIUM BROMIDE 10 MG/ML (PF) SYRINGE
PREFILLED_SYRINGE | INTRAVENOUS | Status: AC
  Filled 2022-02-01: qty 10

## 2022-02-01 MED ORDER — PROPOFOL 10 MG/ML IV BOLUS
INTRAVENOUS | Status: DC | PRN
Start: 2022-02-01 — End: 2022-02-01
  Administered 2022-02-01: 70 mg via INTRAVENOUS
  Administered 2022-02-01: 30 mg via INTRAVENOUS

## 2022-02-01 MED ORDER — BUPIVACAINE-EPINEPHRINE 0.25% -1:200000 IJ SOLN
INTRAMUSCULAR | Status: DC | PRN
  Administered 2022-02-01: 50 mL

## 2022-02-01 MED ORDER — BUPIVACAINE LIPOSOME 1.3 % IJ SUSP
INTRAMUSCULAR | Status: DC | PRN
  Administered 2022-02-01: 20 mL

## 2022-02-01 MED ORDER — PROPOFOL 10 MG/ML IV BOLUS
INTRAVENOUS | Status: AC
  Filled 2022-02-01: qty 20

## 2022-02-01 MED ORDER — FENTANYL CITRATE PF 50 MCG/ML IJ SOSY
PREFILLED_SYRINGE | INTRAMUSCULAR | Status: AC
  Filled 2022-02-01: qty 1

## 2022-02-01 MED ORDER — TRAVASOL 10 % IV SOLN
INTRAVENOUS | Status: AC
  Filled 2022-02-01: qty 1122

## 2022-02-01 MED ORDER — ENOXAPARIN SODIUM 40 MG/0.4ML IJ SOSY
40.0000 mg | PREFILLED_SYRINGE | INTRAMUSCULAR | Status: DC
  Administered 2022-02-02 – 2022-02-10 (×9): 40 mg via SUBCUTANEOUS
  Filled 2022-02-01 (×12): qty 0.4

## 2022-02-01 MED ORDER — MENTHOL 3 MG MT LOZG
1.0000 | LOZENGE | OROMUCOSAL | Status: DC | PRN
  Administered 2022-02-03: 3 mg via ORAL
  Filled 2022-02-01 (×2): qty 9

## 2022-02-01 MED ORDER — 0.9 % SODIUM CHLORIDE (POUR BTL) OPTIME
TOPICAL | Status: DC | PRN
  Administered 2022-02-01: 2000 mL

## 2022-02-01 MED ORDER — ONDANSETRON HCL 4 MG/2ML IJ SOLN
INTRAMUSCULAR | Status: AC
  Filled 2022-02-01: qty 2

## 2022-02-01 MED ORDER — FENTANYL CITRATE PF 50 MCG/ML IJ SOSY
PREFILLED_SYRINGE | INTRAMUSCULAR | Status: AC
  Administered 2022-02-01: 50 ug via INTRAVENOUS
  Filled 2022-02-01: qty 1

## 2022-02-01 MED ORDER — PHENYLEPHRINE HCL (PRESSORS) 10 MG/ML IV SOLN
INTRAVENOUS | Status: AC
  Filled 2022-02-01: qty 1

## 2022-02-01 MED ORDER — LIP MEDEX EX OINT
1.0000 "application " | TOPICAL_OINTMENT | Freq: Two times a day (BID) | CUTANEOUS | Status: DC
  Administered 2022-02-01 – 2022-02-24 (×40): 1 via TOPICAL
  Filled 2022-02-01 (×8): qty 7

## 2022-02-01 MED ORDER — LIDOCAINE HCL (PF) 2 % IJ SOLN
INTRAMUSCULAR | Status: AC
  Filled 2022-02-01: qty 5

## 2022-02-01 MED ORDER — BUPIVACAINE LIPOSOME 1.3 % IJ SUSP
INTRAMUSCULAR | Status: AC
  Filled 2022-02-01: qty 20

## 2022-02-01 MED ORDER — LACTATED RINGERS IV SOLN: INTRAVENOUS | Status: DC | PRN

## 2022-02-01 MED ORDER — MORPHINE SULFATE (PF) 2 MG/ML IV SOLN: 1.0000 mg | INTRAVENOUS | Status: DC | PRN

## 2022-02-01 MED ORDER — DEXAMETHASONE SODIUM PHOSPHATE 10 MG/ML IJ SOLN
INTRAMUSCULAR | Status: DC | PRN
  Administered 2022-02-01: 4 mg via INTRAVENOUS

## 2022-02-01 MED ORDER — FENTANYL CITRATE (PF) 250 MCG/5ML IJ SOLN
INTRAMUSCULAR | Status: AC
  Filled 2022-02-01: qty 5

## 2022-02-01 MED ORDER — LACTATED RINGERS IV SOLN: INTRAVENOUS | Status: DC

## 2022-02-01 MED ORDER — METHOCARBAMOL 1000 MG/10ML IJ SOLN
1000.0000 mg | Freq: Four times a day (QID) | INTRAVENOUS | Status: DC | PRN
  Filled 2022-02-01: qty 10

## 2022-02-01 MED ORDER — FENTANYL CITRATE (PF) 250 MCG/5ML IJ SOLN
INTRAMUSCULAR | Status: DC | PRN
  Administered 2022-02-01: 25 ug via INTRAVENOUS
  Administered 2022-02-01 (×2): 50 ug via INTRAVENOUS
  Administered 2022-02-01: 25 ug via INTRAVENOUS
  Administered 2022-02-01 (×2): 50 ug via INTRAVENOUS

## 2022-02-01 MED ORDER — LACTATED RINGERS IV BOLUS: 1000.0000 mL | Freq: Three times a day (TID) | INTRAVENOUS | Status: DC | PRN

## 2022-02-01 MED ORDER — CEFAZOLIN SODIUM-DEXTROSE 1-4 GM/50ML-% IV SOLN
1.0000 g | Freq: Three times a day (TID) | INTRAVENOUS | Status: DC
  Administered 2022-02-01 – 2022-02-04 (×9): 1 g via INTRAVENOUS
  Filled 2022-02-01 (×10): qty 50

## 2022-02-01 MED ORDER — METHOCARBAMOL 1000 MG/10ML IJ SOLN: 1000.0000 mg | Freq: Four times a day (QID) | INTRAVENOUS | Status: DC | PRN

## 2022-02-01 MED ORDER — HYDROMORPHONE HCL 1 MG/ML IJ SOLN
0.5000 mg | INTRAMUSCULAR | Status: DC | PRN
  Administered 2022-02-01 – 2022-02-02 (×6): 1 mg via INTRAVENOUS
  Administered 2022-02-03: 2 mg via INTRAVENOUS
  Administered 2022-02-03 (×2): 1 mg via INTRAVENOUS
  Administered 2022-02-03: 0.5 mg via INTRAVENOUS
  Administered 2022-02-04 (×3): 1 mg via INTRAVENOUS
  Filled 2022-02-01 (×8): qty 1
  Filled 2022-02-01: qty 2
  Filled 2022-02-01 (×4): qty 1

## 2022-02-01 MED ORDER — ROCURONIUM BROMIDE 10 MG/ML (PF) SYRINGE
PREFILLED_SYRINGE | INTRAVENOUS | Status: DC | PRN
  Administered 2022-02-01: 10 mg via INTRAVENOUS
  Administered 2022-02-01: 60 mg via INTRAVENOUS
  Administered 2022-02-01 (×2): 10 mg via INTRAVENOUS

## 2022-02-01 MED ORDER — DIPHENHYDRAMINE HCL 50 MG/ML IJ SOLN: 12.5000 mg | Freq: Four times a day (QID) | INTRAMUSCULAR | Status: DC | PRN

## 2022-02-01 MED ORDER — SUGAMMADEX SODIUM 200 MG/2ML IV SOLN
INTRAVENOUS | Status: DC | PRN
  Administered 2022-02-01: 150 mg via INTRAVENOUS

## 2022-02-01 MED ORDER — PHENYLEPHRINE HCL-NACL 20-0.9 MG/250ML-% IV SOLN
INTRAVENOUS | Status: DC | PRN
  Administered 2022-02-01: 50 ug/min via INTRAVENOUS

## 2022-02-01 MED ORDER — PHENOL 1.4 % MT LIQD
2.0000 | OROMUCOSAL | Status: DC | PRN
  Filled 2022-02-01: qty 177

## 2022-02-01 MED ORDER — PROCHLORPERAZINE EDISYLATE 10 MG/2ML IJ SOLN
5.0000 mg | INTRAMUSCULAR | Status: DC | PRN
  Administered 2022-02-19: 5 mg via INTRAVENOUS
  Filled 2022-02-01: qty 2

## 2022-02-01 MED ORDER — CHLORHEXIDINE GLUCONATE 0.12 % MT SOLN: 15.0000 mL | Freq: Once | OROMUCOSAL | Status: DC

## 2022-02-01 MED ORDER — DEXAMETHASONE SODIUM PHOSPHATE 10 MG/ML IJ SOLN
INTRAMUSCULAR | Status: AC
  Filled 2022-02-01: qty 1

## 2022-02-01 MED ORDER — FENTANYL CITRATE PF 50 MCG/ML IJ SOSY
25.0000 ug | PREFILLED_SYRINGE | INTRAMUSCULAR | Status: DC | PRN
  Administered 2022-02-01 (×2): 50 ug via INTRAVENOUS

## 2022-02-01 MED ORDER — LACTATED RINGERS IR SOLN
Status: DC | PRN
  Administered 2022-02-01: 1000 mL

## 2022-02-01 MED ORDER — SIMETHICONE 40 MG/0.6ML PO SUSP
80.0000 mg | Freq: Four times a day (QID) | ORAL | Status: DC | PRN
  Filled 2022-02-01: qty 1.2

## 2022-02-01 MED ORDER — BUPIVACAINE-EPINEPHRINE 0.25% -1:200000 IJ SOLN
INTRAMUSCULAR | Status: AC
  Filled 2022-02-01: qty 1

## 2022-02-01 MED ORDER — ONDANSETRON HCL 4 MG/2ML IJ SOLN: 4.0000 mg | Freq: Once | INTRAMUSCULAR | Status: DC | PRN

## 2022-02-01 MED ORDER — ALUM & MAG HYDROXIDE-SIMETH 200-200-20 MG/5ML PO SUSP: 30.0000 mL | Freq: Four times a day (QID) | ORAL | Status: DC | PRN

## 2022-02-01 MED ORDER — ONDANSETRON HCL 4 MG/2ML IJ SOLN
INTRAMUSCULAR | Status: DC | PRN
  Administered 2022-02-01: 4 mg via INTRAVENOUS

## 2022-02-01 SURGICAL SUPPLY — 77 items
APPLIER CLIP 5 13 M/L LIGAMAX5 (MISCELLANEOUS)
APPLIER CLIP ROT 10 11.4 M/L (STAPLE)
BAG COUNTER SPONGE SURGICOUNT (BAG) IMPLANT
BLADE HEX COATED 2.75 (ELECTRODE) ×2 IMPLANT
BLADE SURG SZ10 CARB STEEL (BLADE) ×2 IMPLANT
CABLE HIGH FREQUENCY MONO STRZ (ELECTRODE) ×2 IMPLANT
CELLS DAT CNTRL 66122 CELL SVR (MISCELLANEOUS) IMPLANT
CLIP APPLIE 5 13 M/L LIGAMAX5 (MISCELLANEOUS) IMPLANT
CLIP APPLIE ROT 10 11.4 M/L (STAPLE) IMPLANT
COVER MAYO STAND STRL (DRAPES) ×2 IMPLANT
DRAIN CHANNEL 19F RND (DRAIN) IMPLANT
DRAPE LAPAROSCOPIC ABDOMINAL (DRAPES) ×2 IMPLANT
DRAPE SHEET LG 3/4 BI-LAMINATE (DRAPES) IMPLANT
DRAPE UTILITY XL STRL (DRAPES) ×2 IMPLANT
DRAPE WARM FLUID 44X44 (DRAPES) ×2 IMPLANT
DRSG OPSITE POSTOP 4X10 (GAUZE/BANDAGES/DRESSINGS) IMPLANT
DRSG OPSITE POSTOP 4X6 (GAUZE/BANDAGES/DRESSINGS) ×1 IMPLANT
DRSG OPSITE POSTOP 4X8 (GAUZE/BANDAGES/DRESSINGS) IMPLANT
DRSG TEGADERM 2-3/8X2-3/4 SM (GAUZE/BANDAGES/DRESSINGS) ×4 IMPLANT
DRSG TEGADERM 4X4.75 (GAUZE/BANDAGES/DRESSINGS) ×2 IMPLANT
ELECT REM PT RETURN 15FT ADLT (MISCELLANEOUS) ×2 IMPLANT
ENDOLOOP SUT PDS II  0 18 (SUTURE)
ENDOLOOP SUT PDS II 0 18 (SUTURE) IMPLANT
GAUZE SPONGE 2X2 8PLY STRL LF (GAUZE/BANDAGES/DRESSINGS) ×1 IMPLANT
GAUZE SPONGE 4X4 12PLY STRL (GAUZE/BANDAGES/DRESSINGS) IMPLANT
GLOVE SURG NEOPR MICRO LF SZ8 (GLOVE) ×2 IMPLANT
GLOVE SURG UNDER LTX SZ8 (GLOVE) ×2 IMPLANT
GOWN STRL REUS W/TWL XL LVL3 (GOWN DISPOSABLE) ×8 IMPLANT
HANDLE SUCTION POOLE (INSTRUMENTS) ×1 IMPLANT
IRRIG SUCT STRYKERFLOW 2 WTIP (MISCELLANEOUS) ×2
IRRIGATION SUCT STRKRFLW 2 WTP (MISCELLANEOUS) ×1 IMPLANT
KIT BASIN OR (CUSTOM PROCEDURE TRAY) ×2 IMPLANT
KIT TURNOVER KIT A (KITS) ×1 IMPLANT
LEGGING LITHOTOMY PAIR STRL (DRAPES) ×2 IMPLANT
PAD POSITIONING PINK XL (MISCELLANEOUS) ×2 IMPLANT
PENCIL SMOKE EVACUATOR (MISCELLANEOUS) IMPLANT
PROTECTOR NERVE ULNAR (MISCELLANEOUS) ×2 IMPLANT
RELOAD PROXIMATE 75MM BLUE (ENDOMECHANICALS) ×4 IMPLANT
RELOAD STAPLE 75 3.8 BLU REG (ENDOMECHANICALS) IMPLANT
RETRACTOR WND ALEXIS 18 MED (MISCELLANEOUS) IMPLANT
RTRCTR WOUND ALEXIS 18CM MED (MISCELLANEOUS)
SCISSORS LAP 5X35 DISP (ENDOMECHANICALS) ×2 IMPLANT
SEALER TISSUE G2 STRG ARTC 35C (ENDOMECHANICALS) IMPLANT
SET TUBE SMOKE EVAC HIGH FLOW (TUBING) ×2 IMPLANT
SLEEVE XCEL OPT CAN 5 100 (ENDOMECHANICALS) ×4 IMPLANT
SPIKE FLUID TRANSFER (MISCELLANEOUS) ×2 IMPLANT
SPONGE GAUZE 2X2 STER 10/PKG (GAUZE/BANDAGES/DRESSINGS) ×1
SPONGE T-LAP 18X18 ~~LOC~~+RFID (SPONGE) IMPLANT
STAPLER 90 3.5 STAND SLIM (STAPLE) ×2
STAPLER 90 3.5 STD SLIM (STAPLE) IMPLANT
STAPLER PROXIMATE 75MM BLUE (STAPLE) ×1 IMPLANT
STAPLER VISISTAT 35W (STAPLE) IMPLANT
SUCTION POOLE HANDLE (INSTRUMENTS) ×2
SURGILUBE 2OZ TUBE FLIPTOP (MISCELLANEOUS) IMPLANT
SUT MNCRL AB 4-0 PS2 18 (SUTURE) ×2 IMPLANT
SUT PDS AB 0 CT1 27 (SUTURE) ×2 IMPLANT
SUT PDS AB 1 CTX 36 (SUTURE) ×4 IMPLANT
SUT PDS AB 1 TP1 96 (SUTURE) IMPLANT
SUT PROLENE 0 CT 2 (SUTURE) IMPLANT
SUT SILK 2 0 (SUTURE) ×2
SUT SILK 2 0 SH CR/8 (SUTURE) ×2 IMPLANT
SUT SILK 2-0 18XBRD TIE 12 (SUTURE) ×1 IMPLANT
SUT SILK 3 0 (SUTURE) ×2
SUT SILK 3 0 SH CR/8 (SUTURE) ×3 IMPLANT
SUT SILK 3-0 18XBRD TIE 12 (SUTURE) ×1 IMPLANT
SYR BULB IRRIG 60ML STRL (SYRINGE) ×2 IMPLANT
SYS LAPSCP GELPORT 120MM (MISCELLANEOUS)
SYS WOUND ALEXIS 18CM MED (MISCELLANEOUS) ×2
SYSTEM LAPSCP GELPORT 120MM (MISCELLANEOUS) IMPLANT
SYSTEM WOUND ALEXIS 18CM MED (MISCELLANEOUS) IMPLANT
TAPE UMBILICAL 1/8 X36 TWILL (MISCELLANEOUS) IMPLANT
TOWEL OR 17X26 10 PK STRL BLUE (TOWEL DISPOSABLE) ×4 IMPLANT
TOWEL OR NON WOVEN STRL DISP B (DISPOSABLE) ×2 IMPLANT
TRAY FOLEY MTR SLVR 16FR STAT (SET/KITS/TRAYS/PACK) IMPLANT
TRAY LAPAROSCOPIC (CUSTOM PROCEDURE TRAY) ×2 IMPLANT
TROCAR BLADELESS OPT 5 100 (ENDOMECHANICALS) ×2 IMPLANT
TROCAR XCEL NON-BLD 11X100MML (ENDOMECHANICALS) IMPLANT

## 2022-02-01 NOTE — Assessment & Plan Note (Addendum)
Controlled, not on meds.  

## 2022-02-01 NOTE — Anesthesia Procedure Notes (Signed)
Procedure Name: Intubation Date/Time: 02/01/2022 10:56 AM Performed by: Lollie Sails, CRNA Pre-anesthesia Checklist: Patient identified, Emergency Drugs available, Suction available, Patient being monitored and Timeout performed Patient Re-evaluated:Patient Re-evaluated prior to induction Oxygen Delivery Method: Circle system utilized Preoxygenation: Pre-oxygenation with 100% oxygen Induction Type: IV induction Ventilation: Mask ventilation without difficulty Laryngoscope Size: Miller and 3 Grade View: Grade I Tube type: Oral Tube size: 8.0 mm Number of attempts: 1 Airway Equipment and Method: Stylet Placement Confirmation: ETT inserted through vocal cords under direct vision, positive ETCO2 and breath sounds checked- equal and bilateral Secured at: 23 cm Tube secured with: Tape Dental Injury: Teeth and Oropharynx as per pre-operative assessment

## 2022-02-01 NOTE — Progress Notes (Signed)
I am so appreciative of Dr. Clyda Greener incredible and thoughtful recommendations.  I totally agree with the move ahead with laparoscopic evaluation.  I totally agree that if there is evidence of carcinomatosis, then I would favor as little as possible to be done since I think the outcome clearly would not be in doubt.  Hopefully, we will just see that there is adhesions at can be lysed.  [name] is ready for surgery.  He is looking forward to surgery.  He is on TPN.  He has had no nausea or vomiting.  He is not complain of any pain.  Overall, is feeling no change in his physical exam.  He is to have surgery today.  We will await the operative report.  I am sure that there is any malignancy, Dr. Johney Villegas will do biopsies for [place].  Again, I am very appreciative of the incredible talents of Dr. Johney Villegas.  I know that [name] is in fantastic hands.  Lattie Haw, MD  1Cor: 13:13

## 2022-02-01 NOTE — Anesthesia Postprocedure Evaluation (Signed)
Anesthesia Post Note  Patient: Kevin Villegas  Procedure(s) Performed: LAPAROSCOPIC LYSIS OF ADHESIONS (Abdomen) SMALL BOWEL RESECTION (Abdomen)     Patient location during evaluation: PACU Anesthesia Type: General Level of consciousness: awake and alert, oriented and patient cooperative Pain management: pain level controlled Vital Signs Assessment: post-procedure vital signs reviewed and stable Respiratory status: spontaneous breathing, nonlabored ventilation and respiratory function stable Cardiovascular status: blood pressure returned to baseline and stable Postop Assessment: no apparent nausea or vomiting Anesthetic complications: no   No notable events documented.  Last Vitals:  Vitals:   02/01/22 1330 02/01/22 1345  BP: (!) 144/99 (!) 146/104  Pulse: 96 98  Resp: 19 14  Temp:    SpO2: 94% 98%    Last Pain:  Vitals:   02/01/22 1321  TempSrc:   PainSc: 0-No pain                 Pervis Hocking

## 2022-02-01 NOTE — Anesthesia Preprocedure Evaluation (Addendum)
Anesthesia Evaluation  Patient identified by MRN, date of birth, ID band Patient awake    Reviewed: Allergy & Precautions, NPO status , Patient's Chart, lab work & pertinent test results  Airway Mallampati: II  TM Distance: >3 FB Neck ROM: Full    Dental  (+) Teeth Intact, Dental Advisory Given   Pulmonary  COVID + 01/07/22   Pulmonary exam normal breath sounds clear to auscultation       Cardiovascular hypertension (no home meds), Normal cardiovascular exam Rhythm:Regular Rate:Normal     Neuro/Psych negative neurological ROS  negative psych ROS   GI/Hepatic Neg liver ROS, Partial SBO  Stage 4pancreatic ca s/p multiple surgeries 2022   Endo/Other  negative endocrine ROS  Renal/GU Renal InsufficiencyRenal diseaseCKD 3 Cr 0.97 most recently 01/31/22 S/p L nephrostomy   negative genitourinary   Musculoskeletal negative musculoskeletal ROS (+)   Abdominal   Peds  Hematology  (+) Blood dyscrasia, anemia , Hb 11.7   Anesthesia Other Findings   Reproductive/Obstetrics negative OB ROS                            Anesthesia Physical Anesthesia Plan  ASA: 3  Anesthesia Plan: General   Post-op Pain Management: Ofirmev IV (intra-op)*   Induction: Intravenous  PONV Risk Score and Plan: Ondansetron, Dexamethasone and Treatment may vary due to age or medical condition  Airway Management Planned: Oral ETT  Additional Equipment: None  Intra-op Plan:   Post-operative Plan: Extubation in OR  Informed Consent: I have reviewed the patients History and Physical, chart, labs and discussed the procedure including the risks, benefits and alternatives for the proposed anesthesia with the patient or authorized representative who has indicated his/her understanding and acceptance.     Dental advisory given  Plan Discussed with: CRNA  Anesthesia Plan Comments:        Anesthesia Quick  Evaluation

## 2022-02-01 NOTE — Interval H&P Note (Signed)
History and Physical Interval Note:  02/01/2022 10:01 AM  Kevin Villegas  has presented today for surgery, with the diagnosis of PARTIAL SMALL BOWEL OBSTRUCTION.  The various methods of treatment have been discussed with the patient and family. After consideration of risks, benefits and other options for treatment, the patient has consented to  Procedure(s): LAPAROSCOPIC LYSIS OF ADHESIONS (N/A) LAPAROSCOPIC SMALL BOWEL RESECTION (N/A) as a surgical intervention.  The patient's history has been reviewed, patient examined, no change in status, stable for surgery.  I have reviewed the patient's chart and labs.  Questions were answered to the patient's satisfaction.    I have re-reviewed the the patient's records, history, medications, and allergies.  I have re-examined the patient.  I again discussed intraoperative plans and goals of post-operative recovery.  The patient agrees to proceed.  Kevin Villegas  [birth year] [id number]  Patient Care Team: Dettinger, [name], MD as PCP - General (Family Medicine) Mariah Milling, Karyl Kinnier., MD as Consulting Physician (Surgical Oncology) Volanda Napoleon, MD as Medical Oncologist (Oncology) Cordelia Poche, RN as Oncology Nurse Navigator  Patient Active Problem List   Diagnosis Date Noted   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  with metastatic disease 06/27/2019    Priority: High   SBO (small bowel obstruction) ([place]) 01/28/2022   Subcutaneous nodule of abdominal wall 01/09/2022   Partial small bowel obstruction ([place]) 01/07/2022   COVID-19 virus infection 01/07/2022   Colostomy in place [facility]) 11/10/2021   Stage 3a chronic kidney disease (CKD) ([place]) 11/10/2021   Pyelonephritis    Essential hypertension 10/30/2021   Hydronephrosis of left kidney 10/28/2021   Ureteral stricture, left 10/27/2021   Protein-calorie malnutrition, severe 06/12/2021   Hydroureter on right 06/10/2021   Aortic atherosclerosis ([place]) 06/10/2021   Right ureteral stone  06/02/2021   Colon stricture ([place])    Pancreatic mass 06/27/2019   Rhinitis medicamentosa 02/26/2019    Past Medical History:  Diagnosis Date   Colostomy present ([place])    Large bowel obstruction ([place])    Pancreatic cancer [facility])     Past Surgical History:  Procedure Laterality Date   ABDOMINAL SURGERY     BIOPSY  05/20/2021   Procedure: BIOPSY;  Surgeon: Otis Brace, MD;  [facility];  Service: Gastroenterology;;   BOWEL RESECTION     BOWEL RESECTION     CHOLECYSTECTOMY N/A 12/16/2014   Procedure: LAPAROSCOPIC CHOLECYSTECTOMY WITH INTRAOPERATIVE CHOLANGIOGRAM;  Surgeon: Georganna Skeans, MD;  Location: [place];  Service: General;  Laterality: N/A;   COLONIC STENT PLACEMENT N/A 05/26/2021   Procedure: COLONIC STENT PLACEMENT;  Surgeon: Clarene Essex, MD;  [facility];  Service: Endoscopy;  Laterality: N/A;   CYSTOSCOPY W/ URETERAL STENT PLACEMENT Left 10/27/2021   Procedure: CYSTOSCOPY WITH RETROGRADE PYELOGRAM/URETERAL STENT PLACEMENT;  Surgeon: Raynelle Bring, MD;  [facility];  Service: Urology;  Laterality: Left;   ESOPHAGOGASTRODUODENOSCOPY N/A 05/20/2021   Procedure: ESOPHAGOGASTRODUODENOSCOPY (EGD);  Surgeon: Otis Brace, MD;  Location[facility];  Service: Gastroenterology;  Laterality: N/A;   EUS N/A 09/24/2015   Procedure: UPPER ENDOSCOPIC ULTRASOUND (EUS) LINEAR;  Surgeon: Milus Banister, MD;  [facility];  Service: Endoscopy;  Laterality: N/A;   FLEXIBLE SIGMOIDOSCOPY N/A 05/20/2021   Procedure: FLEXIBLE SIGMOIDOSCOPY;  Surgeon: Otis Brace, MD;  [facility];  Service: Gastroenterology;  Laterality: N/A;   FLEXIBLE SIGMOIDOSCOPY N/A 05/26/2021   Procedure: FLEXIBLE SIGMOIDOSCOPY;  Surgeon: Clarene Essex, MD;  [facility];  Service: Endoscopy;  Laterality: N/A;   IR  NEPHROSTOMY EXCHANGE LEFT  12/23/2021   IR NEPHROSTOMY EXCHANGE LEFT  01/17/2022   IR NEPHROSTOMY PLACEMENT LEFT  11/11/2021   IR  NEPHROSTOMY PLACEMENT RIGHT  11/11/2021   LAPAROSCOPIC DISTAL PANCREATECTOMY  10/2017   [facility]   LAPAROSCOPIC PARTIAL GASTRECTOMY  10/2017   [facility]   LAPAROSCOPIC SIGMOID COLECTOMY N/A 06/14/2021   Procedure: LAPAROSCOPY WITH MOBILIZATION, PROXIMAL DIVERTING COLOSTOMY, CREATION OF DISTAL MUCOUS FISTULA;  Surgeon: Johnathan Hausen, MD;  [facility];  Service: General;  Laterality: N/A;   LAPAROSCOPIC SPLENECTOMY  10/2017   [facility]   VASECTOMY      Social History   Socioeconomic History   Marital status: Married    Spouse name: Not on file   Number of children: 2   Years of education: Not on file   Highest education level: Not on file  Occupational History   Occupation: retired  Tobacco Use   Smoking status: Never   Smokeless tobacco: Never  Vaping Use   Vaping Use: Never used  Substance and Sexual Activity   Alcohol use: No    Alcohol/week: 0.0 standard drinks   Drug use: No   Sexual activity: Yes  Other Topics Concern   Not on file  Social History Narrative   Not on file   Social Determinants of Health   Financial Resource Strain: Not on file  Food Insecurity: Not on file  Transportation Needs: Not on file  Physical Activity: Not on file  Stress: Not on file  Social Connections: Not on file  Intimate Partner Violence: Not on file    Family History  Problem Relation Age of Onset   Stroke Mother     Medications Prior to Admission  Medication Sig Dispense Refill Last Dose   Multiple Vitamin (MULTIVITAMIN WITH MINERALS) TABS tablet Take 1 tablet by mouth daily.   01/28/2022   polyethylene glycol (MIRALAX / GLYCOLAX) 17 g packet Take 17 g by mouth 2 (two) times daily. Titrate down to once daily if developing diarrhea. 60 each 0 01/28/2022   mirtazapine (REMERON) 15 MG tablet Take 1 tablet (15 mg total) by mouth at bedtime. (Patient not taking: Reported on 01/29/2022) 90 tablet 1 Not Taking    Current Facility-Administered Medications  Medication Dose Route Frequency  Provider Last Rate Last Admin   [MAR Hold] 0.9 %  sodium chloride infusion   Intravenous PRN Marcelyn Bruins, MD   Stopped at 01/28/22 2301   [MAR Hold] acetaminophen (TYLENOL) tablet 650 mg  650 mg Oral Q6H PRN Marcelyn Bruins, MD   650 mg at 01/31/22 2040   Or   [MAR Hold] acetaminophen (TYLENOL) suppository 650 mg  650 mg Rectal Q6H PRN Marcelyn Bruins, MD       acetaminophen (TYLENOL) tablet 1,000 mg  1,000 mg Oral On Call to OR Michael Boston, MD       bupivacaine liposome (EXPAREL) 1.3 % injection 266 mg  20 mL Infiltration Once Michael Boston, MD       cefoTEtan (CEFOTAN) 2 g in sodium chloride 0.9 % 100 mL IVPB  2 g Intravenous On Call to OR Michael Boston, MD       [facility] Hold] cefTRIAXone (ROCEPHIN) 1 g in sodium chloride 0.9 % 100 mL IVPB  1 g Intravenous Q24H Marcelyn Bruins, MD   Stopped at 01/31/22 1229   [MAR Hold] Chlorhexidine Gluconate Cloth 2 % PADS 6 each  6 each Topical Daily Marcelyn Bruins, MD   6 each at 01/31/22 1118  Chlorhexidine Gluconate Cloth 2 % PADS 6 each  6 each Topical Once Michael Boston, MD       [facility] Hold] feeding supplement (BOOST / RESOURCE BREEZE) liquid 1 Container  1 Container Oral Q24H Donne Hazel, MD   1 Container at 01/31/22 1118   [MAR Hold] feeding supplement (ENSURE ENLIVE / ENSURE PLUS) liquid 237 mL  237 mL Oral Q24H Donne Hazel, MD   237 mL at 01/30/22 1325   gabapentin (NEURONTIN) capsule 300 mg  300 mg Oral On Call to OR Michael Boston, MD       [facility] Hold] insulin aspart (novoLOG) injection 0-9 Units  0-9 Units Subcutaneous Q4H Dimple Nanas, RPH   1 Units at 02/01/22 0753   [MAR Hold] oxyCODONE (Oxy IR/ROXICODONE) immediate release tablet 5 mg  5 mg Oral Q4H PRN Donne Hazel, MD   5 mg at 01/30/22 2155   [facility] Hold] polyethylene glycol (MIRALAX / GLYCOLAX) packet 17 g  17 g Oral BID PRN Rolm Bookbinder, MD       [facility] Hold] sodium chloride flush (NS) 0.9 % injection 10-40 mL  10-40 mL Intracatheter PRN Donne Hazel, MD       [facility] Hold] sodium chloride flush (NS) 0.9 % injection 3 mL  3 mL Intravenous Q12H Marcelyn Bruins, MD   3 mL at 01/31/22 2040   TPN ADULT (ION)   Intravenous Continuous TPN Luiz Ochoa, RPH 50 mL/hr at 01/31/22 1900 Infusion Verify at 01/31/22 1900     Allergies  Allergen Reactions   Pollen Extract Other (See Comments)    Sinus congestion    BP (!) 138/91    Pulse 75    Temp 98.4 F (36.9 C) (Oral)    Resp 16    Ht 5\' 11"  (1.803 m)    Wt 59.9 kg    SpO2 97%    BMI 18.42 kg/m   Labs: Results for orders placed or performed during the hospital encounter of 01/28/22 (from the past 48 hour(s))  Glucose, capillary     Status: Abnormal   Collection Time: 01/30/22  7:40 PM  Result Value Ref Range   Glucose-Capillary 107 (H) 70 - 99 mg/dL    Comment: Glucose reference range applies only to samples taken after fasting for at least 8 hours.  Glucose, capillary     Status: Abnormal   Collection Time: 01/30/22 11:34 PM  Result Value Ref Range   Glucose-Capillary 124 (H) 70 - 99 mg/dL    Comment: Glucose reference range applies only to samples taken after fasting for at least 8 hours.  Glucose, capillary     Status: Abnormal   Collection Time: 01/31/22  4:04 AM  Result Value Ref Range   Glucose-Capillary 119 (H) 70 - 99 mg/dL    Comment: Glucose reference range applies only to samples taken after fasting for at least 8 hours.  Comprehensive metabolic panel     Status: Abnormal   Collection Time: 01/31/22  5:07 AM  Result Value Ref Range   Sodium 136 135 - 145 mmol/L   Potassium 4.4 3.5 - 5.1 mmol/L   Chloride 106 98 - 111 mmol/L   CO2 24 22 - 32 mmol/L   Glucose, Bld 130 (H) 70 - 99 mg/dL    Comment: Glucose reference range applies only to samples taken after fasting for at least 8 hours.   BUN 22 8 - 23 mg/dL   Creatinine, Ser 0.97 0.61 - 1.24 mg/dL  Calcium 8.7 (L) 8.9 - 10.3 mg/dL   Total Protein 5.9 (L) 6.5 - 8.1 g/dL   Albumin 2.3 (L) 3.5 - 5.0 g/dL    AST 24 15 - 41 U/L   ALT 17 0 - 44 U/L   Alkaline Phosphatase 58 38 - 126 U/L   Total Bilirubin 0.1 (L) 0.3 - 1.2 mg/dL   GFR, Estimated >60 >60 mL/min    Comment: (NOTE) Calculated using the CKD-EPI Creatinine Equation (2021)    Anion gap 6 5 - 15    Comment: Performed at [facility], [place] [address], [place], [place] [address]  Magnesium     Status: None   Collection Time: 01/31/22  5:07 AM  Result Value Ref Range   Magnesium 1.7 1.7 - 2.4 mg/dL    Comment: Performed at [facility], [place] [address], [place], [place] [address]  Phosphorus     Status: None   Collection Time: 01/31/22  5:07 AM  Result Value Ref Range   Phosphorus 3.0 2.5 - 4.6 mg/dL    Comment: Performed at [facility], [place] [address] [place], [place] [address]  Triglycerides     Status: None   Collection Time: 01/31/22  5:07 AM  Result Value Ref Range   Triglycerides 115 <150 mg/dL    Comment: Performed at [facility], [place] [address], [place], [place] [address]  Glucose, capillary     Status: Abnormal   Collection Time: 01/31/22  7:56 AM  Result Value Ref Range   Glucose-Capillary 114 (H) 70 - 99 mg/dL    Comment: Glucose reference range applies only to samples taken after fasting for at least 8 hours.  Glucose, capillary     Status: Abnormal   Collection Time: 01/31/22 12:08 PM  Result Value Ref Range   Glucose-Capillary 106 (H) 70 - 99 mg/dL    Comment: Glucose reference range applies only to samples taken after fasting for at least 8 hours.  Glucose, capillary     Status: Abnormal   Collection Time: 01/31/22  4:30 PM  Result Value Ref Range   Glucose-Capillary 106 (H) 70 - 99 mg/dL    Comment: Glucose reference range applies only to samples taken after fasting for at least 8 hours.  Glucose, capillary     Status: Abnormal   Collection Time: 01/31/22  8:15 PM  Result Value Ref Range   Glucose-Capillary 123 (H) 70 - 99 mg/dL     Comment: Glucose reference range applies only to samples taken after fasting for at least 8 hours.  Glucose, capillary     Status: Abnormal   Collection Time: 02/01/22 12:00 AM  Result Value Ref Range   Glucose-Capillary 121 (H) 70 - 99 mg/dL    Comment: Glucose reference range applies only to samples taken after fasting for at least 8 hours.  Glucose, capillary     Status: Abnormal   Collection Time: 02/01/22  4:03 AM  Result Value Ref Range   Glucose-Capillary 108 (H) 70 - 99 mg/dL    Comment: Glucose reference range applies only to samples taken after fasting for at least 8 hours.  Glucose, capillary     Status: Abnormal   Collection Time: 02/01/22  7:38 AM  Result Value Ref Range   Glucose-Capillary 131 (H) 70 - 99 mg/dL    Comment: Glucose reference range applies only to samples taken after fasting for at least 8 hours.  Comprehensive metabolic panel  Status: Abnormal   Collection Time: 02/01/22  8:22 AM  Result Value Ref Range   Sodium 134 (L) 135 - 145 mmol/L   Potassium 4.7 3.5 - 5.1 mmol/L   Chloride 100 98 - 111 mmol/L   CO2 27 22 - 32 mmol/L   Glucose, Bld 109 (H) 70 - 99 mg/dL    Comment: Glucose reference range applies only to samples taken after fasting for at least 8 hours.   BUN 27 (H) 8 - 23 mg/dL   Creatinine, Ser 1.17 0.61 - 1.24 mg/dL   Calcium 9.0 8.9 - 10.3 mg/dL   Total Protein 6.6 6.5 - 8.1 g/dL   Albumin 2.4 (L) 3.5 - 5.0 g/dL   AST 29 15 - 41 U/L   ALT 20 0 - 44 U/L   Alkaline Phosphatase 62 38 - 126 U/L   Total Bilirubin 0.2 (L) 0.3 - 1.2 mg/dL   GFR, Estimated >60 >60 mL/min    Comment: (NOTE) Calculated using the CKD-EPI Creatinine Equation (2021)    Anion gap 7 5 - 15    Comment: Performed at [facility], [place] [address], [place], [place] [address]  Magnesium     Status: None   Collection Time: 02/01/22  8:22 AM  Result Value Ref Range   Magnesium 2.0 1.7 - 2.4 mg/dL    Comment: Performed at [facility], [place] [address] [place], [place] [address]  Phosphorus     Status: None   Collection Time: 02/01/22  8:22 AM  Result Value Ref Range   Phosphorus 3.3 2.5 - 4.6 mg/dL    Comment: Performed at [facility], [place] [address] [place],  [address]    Imaging / Studies: CT ABDOMEN PELVIS WO CONTRAST  Result Date: 01/28/2022 CLINICAL DATA:  Abdominal pain. Suspicion of bowel obstruction. History of colostomy EXAM: CT ABDOMEN AND PELVIS WITHOUT CONTRAST TECHNIQUE: Multidetector CT imaging of the abdomen and pelvis was performed following the standard protocol without IV contrast. RADIATION DOSE REDUCTION: This exam was performed according to the departmental dose-optimization program which includes automated exposure control, adjustment of the mA and/or kV according to patient size and/or use of iterative reconstruction technique. COMPARISON:  01/07/2022 FINDINGS: Lower chest: Bilateral pulmonary nodules consistent with metastasis. Index right lower lobe 2.2 cm nodule on 12/04 is similar to on the prior. Lingular nodule measures 1.2 cm on 12/04. Loculated left-sided pleural effusion with pleural thickening, similar. Hepatobiliary: Hepatic cysts. Cholecystectomy, without biliary ductal dilatation. Pancreas: Per report partial pancreatectomy. Pancreas poorly evaluated secondary to technique and paucity of retroperitoneal fat. Spleen: Surgically absent Adrenals/Urinary Tract: Normal adrenal glands. Left ureteric stent and nephrostomy catheter in place. Marked right renal atrophy with chronic hydroureteronephrosis. This is followed to an area of persistent distal right ureteric hyperattenuation including on 77/2. Stomach/Bowel: The stomach is decompressed, appears mildly thick walled. A duodenal stent is similar in position, without upstream dilatation. Hartmann's pouch. The remaining sigmoid is thick walled. Descending colostomy. Proximal and mid small bowel loops are fluid-filled  and dilated including at up to 4.3 cm on 47/2. 4.9 cm at the same level on the prior. Transition point in the left upper pelvis again suspected, including on 67/2. Distal small bowel loops are decompressed. Normal appendix. Vascular/Lymphatic: Aortic atherosclerosis. No Dequann Vandervelden abdominopelvic adenopathy. Reproductive: Mild prostatomegaly. Other: No significant free fluid. No free intraperitoneal air. Right abdominal wall subcutaneous 2.6 cm nodule on 46/2 is similar to on the prior. Suspect developing left chest wall/intercostal  metastasis, as evidenced by soft tissue fullness on 01/02. Musculoskeletal: Bilateral hip osteoarthritis. Lumbosacral spondylosis. IMPRESSION: 1. Degraded exam, secondary to lack of oral or IV contrast. 2. Since 01/07/2022, redemonstration of high-grade partial small-bowel obstruction with transition point in the upper left pelvis psych: likely due to an adhesion. 3. Pulmonary metastasis with loculated left-sided pleural effusion and probable anterior left chest wall intercostal metastasis. 4. Right renal atrophy and hydroureteronephrosis. Possible distal right ureteric stones versus retained contrast. 5. Similar right abdominal wall soft tissue nodule, suspicious for metastatic disease. Electronically Signed   By: Abigail Miyamoto M.D.   On: 01/28/2022 18:05   CT ABDOMEN PELVIS WO CONTRAST  Result Date: 01/07/2022 CLINICAL DATA:  Abdominal pain.  History of colostomy. EXAM: CT ABDOMEN AND PELVIS WITHOUT CONTRAST TECHNIQUE: Multidetector CT imaging of the abdomen and pelvis was performed following the standard protocol without IV contrast. RADIATION DOSE REDUCTION: This exam was performed according to the departmental dose-optimization program which includes automated exposure control, adjustment of the mA and/or kV according to patient size and/or use of iterative reconstruction technique. COMPARISON:  12/09/2021 FINDINGS: Lower chest: Pulmonary nodules again noted in the lung bases. 14 mm  nodule right lower lobe on image 5/series 4 is stable in the interval. Similar appearance small left pleural effusion. Hepatobiliary: Scattered hypoattenuating lesions in the liver parenchyma are similar to prior and incompletely characterized on noncontrast imaging. Gallbladder surgically absent. No intrahepatic or extrahepatic biliary dilation. Pancreas: Status post distal pancreatectomy. Spleen: Status post splenectomy. Adrenals/Urinary Tract: No adrenal nodule or mass. Right renal atrophy again noted with right-sided hydroureteronephrosis. Double-J internal ureteral stent noted left kidney with associated percutaneous nephrostomy tube in the left kidney. Bladder is decompressed. Stomach/Bowel: Stomach is decompressed. Duodenum is normally positioned as is the ligament of Treitz. Distal duodenal stent is similar to prior. Small bowel loops are more dilated on today's study with fluid-filled small bowel dilated up to 4.8 cm diameter in the mid abdomen deep to the umbilicus. Abrupt transition zone is not clearly demonstrated but appears to be in the left pelvis, similar in location to the prior study (axial 64/2). Distal small bowel loops of the pelvis are decompressed. Terminal ileum not well seen. The appendix is not well visualized, but there is no edema or inflammation in the region of the cecum. Left abdominal sigmoid end colostomy noted with Hartmann's pouch anatomy. Vascular/Lymphatic: There is mild atherosclerotic calcification of the abdominal aorta without aneurysm. There is no gastrohepatic or hepatoduodenal ligament lymphadenopathy. No retroperitoneal or mesenteric lymphadenopathy. No pelvic sidewall lymphadenopathy. Reproductive: The prostate gland and seminal vesicles are unremarkable. Other: No intraperitoneal free fluid. Musculoskeletal: No worrisome lytic or sclerotic osseous abnormality. Stable appearance of soft tissue nodules anterior right abdominal wall. IMPRESSION: 1. Similar fluid-filled  dilatation of proximal and mid small bowel with transition zone identified in the left pelvis. Imaging features suspicious for small bowel obstruction secondary to adhesion. 2. Left abdominal and sigmoid colostomy with Hartmann's pouch anatomy. 3. Left percutaneous nephrostomy tube with double-J left internal ureteral stent in place. 4. Right renal atrophy with right-sided hydroureteronephrosis, stable. 5. Stable appearance of small left pleural effusion. 6. Stable appearance of bilateral pulmonary nodules compatible with metastatic disease. 7. Status post distal pancreatectomy and splenectomy. 8. Aortic Atherosclerosis (ICD10-I70.0). Electronically Signed   By: Misty Hoogendoorn M.D.   On: 01/07/2022 16:29   DG CHEST PORT 1 VIEW  Result Date: 01/07/2022 CLINICAL DATA:  COVID. EXAM: PORTABLE CHEST 1 VIEW COMPARISON:  Chest x-ray 06/11/2021. CT abdomen and  pelvis 01/07/2022. CT 11/26/2021. FINDINGS: Right chest port catheter tip projects over the mid SVC. There is a moderate-sized left pleural effusion which may be loculated near the apex. Loculation may be new from prior study. Bilateral pulmonary nodules are again seen which appear similar to prior CT 11/26/2021. No new focal lung infiltrate. No pneumothorax. Cardiomediastinal silhouette appears stable. Heart is enlarged aorta is ectatic. No acute fractures are seen. IMPRESSION: 1. Moderate left pleural effusion is likely partially loculated in the upper hemithorax. Loculation may be new from prior. 2. Grossly unchanged bilateral pulmonary nodules. 3. No new focal lung infiltrate. Electronically Signed   By: Ronney Asters M.D.   On: 01/07/2022 21:56   DG Abd Portable 1V-Small Bowel Obstruction Protocol-24 hr delay  Result Date: 01/08/2022 CLINICAL DATA:  Small-bowel obstruction, 24 hour delay image. EXAM: PORTABLE ABDOMEN - 1 VIEW COMPARISON:  01/08/2022. FINDINGS: Distended loops of small bowel are noted in the left abdomen measuring up to 3.7 cm in diameter.  There is associated fold thickening. A percutaneous nephrostomy tube terminates in the left upper quadrant. Contrast is present in the colon. Suture material is noted in the left lower quadrant. A left ureteral stent is in place. A stent is present in the region of the distal duodenum. No acute osseous abnormality. IMPRESSION: Persistent distended small bowel with fold thickening measuring up to 3.7 cm in diameter, slightly improved from the prior exam. Electronically Signed   By: Brett Fairy M.D.   On: 01/08/2022 23:59   DG Abd Portable 1V-Small Bowel Obstruction Protocol-initial, 8 hr delay  Result Date: 01/08/2022 CLINICAL DATA:  Small-bowel obstruction follow-up EXAM: PORTABLE ABDOMEN - 1 VIEW COMPARISON:  CT abdomen and pelvis 01/07/2022 FINDINGS: Abnormal gas-filled distended loops of small bowel in the mid and upper abdomen measuring up to 4.9 cm in diameter. The colon is normal caliber. No suspicious calcifications identified. Cholecystectomy clips. Left ureteral stent and left percutaneous nephrostomy tube. IMPRESSION: Persistent abnormal distended small bowel suggesting obstruction. Electronically Signed   By: Ofilia Neas M.D.   On: 01/08/2022 10:27   US BIOPSY (ABDOMINAL RETROPERTIONEAL MASS)  Result Date: 01/10/2022 INDICATION: 81 year old male referred for biopsy of abdominal wall mass, likely metastatic disease EXAM: ULTRASOUND-GUIDED BIOPSY SOFT TISSUE MASS MEDICATIONS: None. ANESTHESIA/SEDATION: Moderate (conscious) sedation was employed during this procedure. A total of Versed 2.0 mg and Fentanyl 100 mcg was administered intravenously by the radiology nurse. Total intra-service moderate Sedation Time: 10 minutes. The patient's level of consciousness and vital signs were monitored continuously by radiology nursing throughout the procedure under my direct supervision. FLUOROSCOPY TIME:  None COMPLICATIONS: None PROCEDURE: Informed written consent was obtained from the patient after a  thorough discussion of the procedural risks, benefits and alternatives. All questions were addressed. Maximal Sterile Barrier Technique was utilized including caps, mask, sterile gowns, sterile gloves, sterile drape, hand hygiene and skin antiseptic. A timeout was performed prior to the initiation of the procedure. Ultrasound survey was performed with images stored and sent to PACs. The anterior right abdominal wall was prepped with chlorhexidine in a sterile fashion, and a sterile drape was applied covering the operative field. A sterile gown and sterile gloves were used for the procedure. Local anesthesia was provided with 1% Lidocaine. Ultrasound guidance was used to infiltrate the region with 1% lidocaine for local anesthesia. Using ultrasound guidance, four separate 18 gauge core biopsy were then acquired of the heterogeneously hypoechoic pathologic nodule using ultrasound guidance. Images were stored. Final image was stored after biopsy. Patient tolerated  the procedure well and remained hemodynamically stable throughout. No complications were encountered and no significant blood loss was encounter IMPRESSION: Status post ultrasound-guided biopsy of right abdominal wall mass, likely metastatic disease. Signed, Dulcy Fanny. Dellia Nims, RPVI [facility] Electronically Signed   By: Corrie Mckusick D.O.   On: 01/10/2022 13:14   IR NEPHROSTOMY EXCHANGE LEFT  Result Date: 01/17/2022 INDICATION: 81 year old male with metastatic pancreatic cancer and left ureteral obstruction. He has an indwelling percutaneous nephrostomy tube which accidentally became fractured last night resulting in leakage. He presents for tube exchange. EXAM: Nephrostomy tube exchange COMPARISON:  None. MEDICATIONS: None. ANESTHESIA/SEDATION: None. CONTRAST:  None. FLUOROSCOPY TIME:  Fluoroscopy Time: 0 minutes 24 seconds (2 mGy). COMPLICATIONS: None immediate. PROCEDURE: Informed written  consent was obtained from the patient after a thorough discussion of the procedural risks, benefits and alternatives. All questions were addressed. Maximal Sterile Barrier Technique was utilized including caps, mask, sterile gowns, sterile gloves, sterile drape, hand hygiene and skin antiseptic. A timeout was performed prior to the initiation of the procedure. The existing tube remains well formed and overlies the superior aspect of the indwelling double-J ureteral stent confirming its presence within the renal pelvis. The tube was transected and removed over an Amplatz wire. A new Cook 10.2 [place] all-purpose drain was advanced over the wire and formed in the renal pelvis. There was return of urine. The catheter was secured to the skin with 0 Prolene suture and connected to gravity bag drainage. IMPRESSION: Successful exchange for a new 10.2 French percutaneous nephrostomy tube. Electronically Signed   By: Jacqulynn Cadet M.D.   On: 01/17/2022 16:19     .Adin Hector, M.D., F.A.C.S. Gastrointestinal and Minimally Invasive Surgery [facility] [address], [place], [place] [address] [phone] Main / Paging  02/01/2022 10:01 AM    Adin Hector

## 2022-02-01 NOTE — Assessment & Plan Note (Addendum)
Completed antibiotics 

## 2022-02-01 NOTE — Progress Notes (Addendum)
PHARMACY - TOTAL PARENTERAL NUTRITION CONSULT NOTE   Indication: Small bowel obstruction and inability to meet nutritional needs w/ PO intake  Patient Measurements: Height: 5' 11" (180.3 cm) Weight: 59.9 kg (132 lb 0.9 oz) IBW/kg (Calculated) : 75.3 TPN AdjBW (KG): 57.7 Body mass index is 18.42 kg/m. Usual Weight: Weight noted in November 2022 to be 63.5 kg  Assessment: 81 yo male presenting to [facility] ED for abdominal pain, nausea, vomiting. PMH includes pancreatic cancer with evidence of metastatic disease to his bladder, lungs, and pleural space and history of SBO.  S/p stent placement between stomach and intestines and colostomy placement in June 2022. Pharmacy is consulted to begin TPN for possible bridge to surgery given low pre-albumin and inability to meet nutritional needs w/ PO intake.   Glucose / Insulin: No hx DM; 2 units sensitive SSI required in past 24 hours. CBGs at goal (range 106-123).  Electrolytes: Na slightly low at 134. K 4.7, trending up. Corrected Calcium trending up, 10.28. Others WNL.   Renal: SCr WNL. BUN increased to 27. CO2 WNL. Hepatic: AST/ALT, Alk Phos WNL. Tbili low. Pre-albumin low at 8.2 (2/11); albumin low at 2.4. TG 115 (2/13) Intake / Output; MIVF: I/O 1038/1450. LR at 15m/hr ordered prior to surgery. [facility]  GI Imaging: - 2/10 CTAP: re-demonstration of high-grade partial SBO with transition point in the upper left pelvis psych likely due to adhesion.  GI Surgeries / Procedures:  - 2/14: laparoscopic small bowel resection, lysis of adhesions today   Central access: PAC TPN start date: 2/12  Nutritional Goals: Goal TPN rate is 85 mL/hr (provides 112g of protein and 2101 kcals per day)  RD Assessment:  Estimated Needs Total Energy Estimated Needs: 2050-2250 kcal Total Protein Estimated Needs: 105-120 grams Total Fluid Estimated Needs: >/= 2 L/day  Current Nutrition:  NPO for surgery   TPN Ensure Plus once daily ordered Boost Breeze once daily ordered  Plan:  At 1800:  Increase TPN to goal rate of 83mhr Electrolytes in TPN:  Na 5087mL  K 51m63m (decreased) Ca 2mEq90m(decreased) Mg 7mEq/51mhos 15mmol11ml:Ac 1:1 Add standard MVI and trace elements to TPN Continue Sensitive q4h SSI and adjust as needed  IVF per MD Monitor TPN labs on Mon/Thurs CMET, Mag, Phos in AM   Quintrell Baze GLindell SparD, BCPS Clinical Pharmacist 02/01/2022,11:00 AM

## 2022-02-01 NOTE — Op Note (Addendum)
02/01/2022  1:16 PM  PATIENT:  Emilio Math  81 y.o. male  Patient Care Team: Dettinger, [name], MD as PCP - General (Family Medicine) Zani, Karyl Kinnier., MD as Consulting Physician (Surgical Oncology) Volanda Napoleon, MD as Medical Oncologist (Oncology) Cordelia Poche, RN as Oncology Nurse Navigator  PRE-OPERATIVE DIAGNOSIS:   PARTIAL SMALL BOWEL OBSTRUCTION   POST-OPERATIVE DIAGNOSIS:   MALIGNANT SMALL BOWEL OBSTRUCTION HISTORY OF METASTATIC PANCREATIC CANCER  PROCEDURE:   LAPAROSCOPIC LYSIS OF ADHESIONS SMALL BOWEL RESECTION EXCISIONAL BIOPSY OF MESENTERY MASS  SURGEON:  Adin Hector, MD  ASSISTANT: Richard Miu, PA-C An experienced assistant was required given the standard of surgical care given the complexity of the case.  This assistant was needed for exposure, dissection, suction, tissue approximation, retraction, perception, etc.  ANESTHESIA:   local and general  EBL:  Total I/O In: 100 [IV Piggyback:100] Out: 525 [Urine:275; Other:200; Stool:25; Blood:25].  See anesthesia record  Delay start of Pharmacological VTE agent (>24hrs) due to surgical blood loss or risk of bleeding:  no  DRAINS: none   SPECIMEN:   Left colon mesenteric nodule. 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  INDICATION: Patient with history of pancreatic tail cancer requiring distal pancreatectomy splenectomy and partial gastrectomy 2019 at [facility].  He has some metastatic disease in the lung and some oligometastatic disease in the peritoneum.  Managed by Dr. Marin Olp with occasional palliative chemotherapy.  Developed distal colon obstruction in the pelvis requiring Hartmann resection with Dr. Hassell Done 2022.  Has had some progressive obstructive symptoms.  Has tried to adjust his diet but even struggling with  mostly liquid diet.  Admitted for bowel obstruction.  CT scan showing high-grade bowel obstruction with transition zone in the left pelvis.  No definite metastatic disease at that location although some abdominal wall masses suspicious for that including on PET scan.  Had long discussion with patient with numerous of her partners and offered laparoscopic possible open abdominal exploration to see if there is a focal point that potentially is treatable.  He understands with his metastatic disease that it may not be possible.  But he wished to be aggressive  The anatomy & physiology of the digestive tract was discussed.  The pathophysiology of intestinal obstruction was discussed.  Natural history risks without surgery was discussed.   I feel the patient has failed non-operative therapies.  The risks of no intervention will lead to serious problems such as necrosis, perforation, dehydration, etc. that outweigh the operative risks; therefore, I recommended abdominal exploration to diagnose & treat the source of the problem.  Minimally Invasive & open techniques were discussed.   I expressed a good likelihood that surgery will treat the problem.  Risks such as bleeding, infection, abscess, leak, reoperation, bowel resection, possible ostomy, hernia, injury to other organs, need for repair of tissues / organs, need for further treatment, heart attack, death, and other risks were discussed.   I noted a good likelihood this will help address the problem.  Goals of post-operative recovery were discussed as well.  We will work to minimize complications. Questions were answered.  The patient expresses understanding & wishes to proceed with surgery.  OR FINDINGS: Very dilated small bowel with obvious transition point into the left lower quadrant retroperitoneum.  Oligometastatic disease acting as a transition point in this region.  A  few dots of serosal nodule suspicious for oligocarcinomatosis on mid small bowel only.   Rest of the small bowel pristine.  Focal small bowel resection done.  Evidence of thickened nodularity in right abdominal wall x 2, around abdominal wall of the colostomy, left colon colostomy mesentery, rectal stump and left retroperitoneum.  Highly suspicious for persistent focuses of carcinomatosis/metastatic disease.  Stomach not seen in LUQ for potential plication for need of gastrostomy -history of significant gastrectomy  CASE DATA:  Type of patient?: LDOW CASE (Surgical Hospitalist [facility] Inpatient)  Status of Case? URGENT Add On  Infection Present At Time Of Surgery (PATOS)?  NO  DESCRIPTION:  Informed consent was confirmed.  The patient underwent general anaesthesia without difficulty.  The patient was positioned appropriately.  VTE prevention in place.  I made an attempt to place a Foley balloon to help decompress the colon and help diverted away but a 26 [place] Foley would not pass.  My pinky would not pass.  The subcutaneous tissues around the colostomy were rather rocky hard suspicious for carcinomatosis.  Similar to the rocking this in the right upper quadrant and right flank subcutaneous nodules that were suspicious for metastatic disease on most recent PET scan the patient's abdomen was clipped, prepped, & draped in a sterile fashion.  Surgical timeout confirmed our plan.  Peritoneal entry with a laparoscopic port was obtained using Varess spring needle entry technique in the right upper abdomen subcostal as the patient was positioned in reverse Trendelenburg.  I induced carbon dioxide insufflation.  No change in end tidal CO2 measurements.  Full symmetrical abdominal distention.  Initial port was carefully placed under optical entry.  Camera inspection revealed no injury.  Extra ports were carefully placed under direct laparoscopic visualization.    Upon entering the abdomen (organ space), I encountered no obvious infection.  The bowel was quite dilated.  Distal to feet was rather  decompressed.  Transition point could be found to the left retroperitoneum with some adhesions to his end colostomy mesentery on the left side as well as his rectal stump.  There was nodularity highly suspicious for carcinomatosis focal in this region.  I could run the small bowel and the small bowel was massively dilated for the upper two thirds.  There are few small nodules on the serosa of the small intestine just before the transition point and just after the transition point.  About 20 cm.  The rest of small bowel was clear without any carcinomatosis.  Because the patient wished to be aggressive in the hopes of getting this under control so that palliative chemotherapy can be restarted, I decided to proceed with lysis of adhesions.  Initially did sharp scissors and was able to mount a fair amount.  However there are some focal nodularity that were involved.  I transition to harm en bloc resection and made effort to take these nodules en bloc that were causing the transition point.  Significant fibrosis in the left lateral paracolic gutter/retroperitoneum.  Ended up coming through some of that.  Significant scarring and fibrosis & possible cancer needed to free off.  However once I freed that that knuckle where the transition point was, the small bowel was much more mobile.  The rectal stump was quite hard but smooth and essentially his pelvis was frozen with significant fibrosis and possible tumor.  Left colon mesentery where the colostomy was coming up had some nodularity to it.  I excised the most obvious nodule and tried to leave the  rest of the colon mesentery alone the colon seemed soft going up to the abdominal wall but then was thickened nodule in the abdominal wall itself  We decompressed carbon oxide through the ports & created an extraction incision through an infraumbilical incision through the fascia and placed a wound protector.  We eviscerated the small bowel.  This was clearly the transition  point.  Did small resection using a 82 GIA on the dilated proximal bowel and decompressed distal bowel in a side-to-side fashion.  Used suction to suck out feculent succus of about 700 mL.  The chronically thickened small intestine at the proximal anastomosis looked more healthy and less swollen.  We stapled off the common defect using a TX 90 stapler to good result.  Ended up taking about 20 cm where all obvious carcinomatosis involving small bowel was noted.  The small bowel mesentery itself was clean.  I ended up transecting the mesentery close to the small bowel side.  I then draped the small bowel mesentery and closed the mesentery transversely to cover and protect the [place] 90 staple line for a mesenteropexy.  Placed a few silk stitches at the crotch of the anastomosis for antitension.  Returned the small bowel in the abdomen.  We did laparoscopic reexploration and ensured no evidence of serosal injury or bleeding.  Did some focal irrigation with good return.  I did look for the stomach to see if something could be tacked up in case he needs a gastrostomy tube given his carcinomatosis, but I could not find it in the left upper quadrant given his history of prior gastrectomy for his pancreatic cancer.    Preoperative aspirated carbon oxide reports and wound protector.  Change gloves and redraped for clean technique.  Closed infraumbilical midline fascia using 0 PDS in a running fashion given the fact that his abdominal wall was extremely thin.  Closed the skin with 4 Monocryl in place sterile dressing.  Port sites closed with 4-0 Monocryl interrupted suture.  Sterile dressing applied.  Replaced colostomy appliance.  I discussed operative findings, updated the patient's status, discussed probable steps to recovery, and gave postoperative recommendations to the patient's spouse, Kaydn Kumpf.  Recommendations were made.  Questions were answered.  She expressed understanding & appreciation.     Adin Hector, M.D., F.A.C.S. Gastrointestinal and Minimally Invasive Surgery [facility] [address], [place], [place] [address] [phone] Main / Paging

## 2022-02-01 NOTE — Progress Notes (Addendum)
°  Progress Note   Patient: Kevin Villegas LPF:[id number] DOB: 06/20/1941 DOA: 01/28/2022     3 DOS: the patient was seen and examined on 02/01/2022   Brief hospital course: 81 y.o. male with medical history significant of stage 4 pancreatic cancer, status post colectomy, hydronephrosis status post nephrostomy, hypertension, SBO, CKD 3A presents with ongoing abdominal pain.  Patient states he has had 2 to 3 days of increased abdominal pain.  Pain 8 out of 10 today.  He is concerned for issues with his known small bowel obstruction as he has a history of the same.  He states he does have some output in his ostomy still.  Has had some nausea and vomiting. Pt was found to have SBO. General Surgery consulted  Assessment and Plan:  Small bowel obstruction > Patient presented with abdominal pain for the past couple days he was concern for possible small bowel obstruction as he has had similar episodes in the past. > CT did not demonstrate small bowel obstruction.  General surgery following -Difficult situation given prior surgical history related to stage 4 pancreatic cancer. -Appreciate input by Oncology -Pt to undergo diagnostic laparoscopy with lysis of adhesions 2/14 -Now on TPN for nutritional support   CKD 3A > Creatinine stable 1.5 in ED - Avoid nephrotoxic agents - Cr improved with hydration -recheck bmet in AM  Hydronephrosis > Status post left nephrostomy -Cont to follow bmet trends  Stage 4 Pancreatic cancer > Known metastatic pancreatic cancer.  Follows with oncology, Dr. Marin Olp. - Appreciate input by Dr. Marin Olp   Hypertension - Not currently any medication for this -BP stable and controlled at this time  Ecoli UTI -UA suggestive of UTI -urine cx pos for pan-sensitive ecoli -Currently on cefazolin     Subjective: Seen prior to surgery this AM. Pt eagerly awaiting surgery  Physical Exam: Vitals:   02/01/22 1400 02/01/22 1415 02/01/22 1430 02/01/22 1445  BP: (!)  138/104 (!) 142/105 (!) 126/97 (!) 125/93  Pulse: 98 (!) 103 (!) 104 99  Resp: 15 15 18 17   Temp:  98.1 F (36.7 C)    TempSrc:      SpO2: 97% 98% 95% 94%  Weight:      Height:       General exam: Conversant, in no acute distress Respiratory system: normal chest rise, clear, no audible wheezing Cardiovascular system: regular rhythm, s1-s2 Gastrointestinal system: Nondistended, nontender, pos BS Central nervous system: No seizures, no tremors Extremities: No cyanosis, no joint deformities Skin: No rashes, no pallor Psychiatry: Affect normal // no auditory hallucinations   Data Reviewed:  Labs reviewed, Cr improved  Family Communication: Pt in room, family not at bedside  Disposition: Status is: Inpatient Requires inpatient because: Severity of illness    Planned Discharge Destination: Home     Author: Marylu Lund, MD 02/01/2022 3:07 PM  For on call review [email or web address].

## 2022-02-01 NOTE — Transfer of Care (Signed)
Immediate Anesthesia Transfer of Care Note  Patient: Kevin Villegas  Procedure(s) Performed: LAPAROSCOPIC LYSIS OF ADHESIONS (Abdomen) SMALL BOWEL RESECTION (Abdomen)  Patient Location: PACU  Anesthesia Type:General  Level of Consciousness: awake and alert   Airway & Oxygen Therapy: Patient Spontanous Breathing and Patient connected to face mask oxygen  Post-op Assessment: Report given to RN and Post -op Vital signs reviewed and stable  Post vital signs: Reviewed and stable  Last Vitals:  Vitals Value Taken Time  BP 150/96 02/01/22 1321  Temp    Pulse 95 02/01/22 1326  Resp 24 02/01/22 1326  SpO2 97 % 02/01/22 1326  Vitals shown include unvalidated device data.  Last Pain:  Vitals:   02/01/22 0951  TempSrc: Oral  PainSc: 0-No pain      Patients Stated Pain Goal: 0 (37/94/32 7614)  Complications: No notable events documented.

## 2022-02-02 ENCOUNTER — Inpatient Hospital Stay (HOSPITAL_COMMUNITY): Payer: Medicare Other

## 2022-02-02 ENCOUNTER — Encounter (HOSPITAL_COMMUNITY): Payer: Self-pay | Admitting: Surgery

## 2022-02-02 DIAGNOSIS — K56609 Unspecified intestinal obstruction, unspecified as to partial versus complete obstruction: Secondary | ICD-10-CM | POA: Diagnosis not present

## 2022-02-02 LAB — PHOSPHORUS: Phosphorus: 4 mg/dL (ref 2.5–4.6)

## 2022-02-02 LAB — GLUCOSE, CAPILLARY
Glucose-Capillary: 169 mg/dL — ABNORMAL HIGH (ref 70–99)
Glucose-Capillary: 186 mg/dL — ABNORMAL HIGH (ref 70–99)
Glucose-Capillary: 142 mg/dL — ABNORMAL HIGH (ref 70–99)
Glucose-Capillary: 119 mg/dL — ABNORMAL HIGH (ref 70–99)
Glucose-Capillary: 115 mg/dL — ABNORMAL HIGH (ref 70–99)

## 2022-02-02 LAB — COMPREHENSIVE METABOLIC PANEL
ALT: 16 U/L (ref 0–44)
AST: 24 U/L (ref 15–41)
Albumin: 2.1 g/dL — ABNORMAL LOW (ref 3.5–5.0)
Alkaline Phosphatase: 55 U/L (ref 38–126)
Anion gap: 8 (ref 5–15)
BUN: 44 mg/dL — ABNORMAL HIGH (ref 8–23)
CO2: 21 mmol/L — ABNORMAL LOW (ref 22–32)
Calcium: 8.4 mg/dL — ABNORMAL LOW (ref 8.9–10.3)
Chloride: 102 mmol/L (ref 98–111)
Creatinine, Ser: 1.48 mg/dL — ABNORMAL HIGH (ref 0.61–1.24)
GFR, Estimated: 48 mL/min — ABNORMAL LOW (ref >60)
Glucose, Bld: 179 mg/dL — ABNORMAL HIGH (ref 70–99)
Potassium: 4.7 mmol/L (ref 3.5–5.1)
Sodium: 131 mmol/L — ABNORMAL LOW (ref 135–145)
Total Bilirubin: 0.2 mg/dL — ABNORMAL LOW (ref 0.3–1.2)
Total Protein: 5.9 g/dL — ABNORMAL LOW (ref 6.5–8.1)

## 2022-02-02 LAB — MAGNESIUM: Magnesium: 1.9 mg/dL (ref 1.7–2.4)

## 2022-02-02 MED ORDER — ACETAMINOPHEN 10 MG/ML IV SOLN
1000.0000 mg | Freq: Four times a day (QID) | INTRAVENOUS | Status: AC
  Administered 2022-02-02 – 2022-02-03 (×4): 1000 mg via INTRAVENOUS
  Filled 2022-02-02 (×4): qty 100

## 2022-02-02 MED ORDER — METHOCARBAMOL 1000 MG/10ML IJ SOLN
500.0000 mg | Freq: Three times a day (TID) | INTRAVENOUS | Status: DC
  Administered 2022-02-02 – 2022-02-04 (×6): 500 mg via INTRAVENOUS
  Filled 2022-02-02: qty 5
  Filled 2022-02-02 (×4): qty 500
  Filled 2022-02-02: qty 5
  Filled 2022-02-02 (×2): qty 500
  Filled 2022-02-02: qty 5

## 2022-02-02 MED ORDER — LACTATED RINGERS IV BOLUS
1000.0000 mL | Freq: Once | INTRAVENOUS | Status: AC
Start: 2022-02-02 — End: 2022-02-02
  Administered 2022-02-02: 1000 mL via INTRAVENOUS

## 2022-02-02 MED ORDER — LACTATED RINGERS IV BOLUS: 1000.0000 mL | Freq: Three times a day (TID) | INTRAVENOUS | Status: AC | PRN

## 2022-02-02 MED ORDER — TRAVASOL 10 % IV SOLN
INTRAVENOUS | Status: AC
  Filled 2022-02-02: qty 1122

## 2022-02-02 NOTE — Evaluation (Signed)
Occupational Therapy Evaluation Patient Details Name: Kevin Villegas MRN: [id number] DOB: 05-23-1941 Today's Date: 02/02/2022   History of Present Illness Patient is a 81 year old male who presented with 2-3 day history of abdominal pain. patient was found to have SBO. patient underwent diagnostic laparoscopy with lysis of adhesions 2/14. PMH:HTN, stage 4 pancreatic cancer,CKD III, status post colectomy, hydronephrosis status post nephrostomy,   Clinical Impression   Patient is a 81 year old male who was noted to have had a significant decline with recent surgery. Patient was living at home with wife independently prior level. Currently, patient is noted to have a strong lateral lean to L side with +2 needed for transfers and functional mobility. Patient need max A for ADLs at this time with increased lethargic presentation noted. Patient was noted to have increased pain, decreased functional activity tolerance, decreased endurance, decreased sitting and standing balance, decreased safety awareness impacting participation in ADLs. Patient would continue to benefit from skilled OT services at this time while admitted and after d/c to address noted deficits in order to improve overall safety and independence in ADLs.       Recommendations for follow up therapy are one component of a multi-disciplinary discharge planning process, led by the attending physician.  Recommendations may be updated based on patient status, additional functional criteria and insurance authorization.   Follow Up Recommendations  Skilled nursing-short term rehab (<3 hours/day) (has potential to progress to [facility])    Assistance Recommended at Discharge Frequent or constant Supervision/Assistance  Patient can return home with the following A lot of help with walking and/or transfers;A lot of help with bathing/dressing/bathroom;Assistance with cooking/housework;Direct supervision/assist for financial management;Assist for  transportation;Direct supervision/assist for medications management;Help with stairs or ramp for entrance    Functional Status Assessment  Patient has had a recent decline in their functional status and demonstrates the ability to make significant improvements in function in a reasonable and predictable amount of time.  Equipment Recommendations  Other (comment) (defer to next venue)    Recommendations for Other Services       Precautions / Restrictions Precautions Precautions: Fall Precaution Comments: abdominal wounds, ostomy Restrictions Weight Bearing Restrictions: No      Mobility Bed Mobility Overal bed mobility: Needs Assistance Bed Mobility: Supine to Sit, Sit to Supine     Supine to sit: HOB elevated, Mod assist Sit to supine: Max assist   General bed mobility comments: patient needed physical assistance with multi modal cues for supine to sit on edge of bed with log rolling education provided. patient needed increased assistance to get back into bed.    Transfers                          Balance Overall balance assessment: Needs assistance Sitting-balance support: Bilateral upper extremity supported, Feet supported Sitting balance-Leahy Scale: Poor Sitting balance - Comments: leaning to L side Postural control: Left lateral lean Standing balance support: Bilateral upper extremity supported, Reliant on assistive device for balance Standing balance-Leahy Scale: Poor Standing balance comment: continued lateral lean to L side in standing with physical assist to maintain upright posture.                           ADL either performed or assessed with clinical judgement   ADL Overall ADL's : Needs assistance/impaired Eating/Feeding: NPO   Grooming: Wash/dry face;Wash/dry hands;Sitting;Maximal assistance   Upper Body Bathing: Sitting;Maximal assistance  Lower Body Bathing: Bed level;Maximal assistance   Upper Body Dressing :  Sitting;Maximal assistance   Lower Body Dressing: Bed level;Maximal assistance   Toilet Transfer: +2 for physical assistance;+2 for safety/equipment;Moderate assistance Toilet Transfer Details (indicate cue type and reason): patient was noted to have strong lean to L side when standing. patient was able to take a few steps up to head of bed with RW with physical assistance to maintain upright posture. Toileting- Water quality scientist and Hygiene: Sit to/from stand;Total assistance       Functional mobility during ADLs: +2 for safety/equipment;+2 for physical assistance       Vision   Vision Assessment?: No apparent visual deficits Additional Comments: difficult to assess with patient keeping eyes closed for majority of session with increased lethargicness.     Perception     Praxis      Pertinent Vitals/Pain Pain Assessment Pain Assessment: Faces Faces Pain Scale: Hurts even more Pain Location: abdomen and R shoulder with movement Pain Descriptors / Indicators: Discomfort, Grimacing, Guarding Pain Intervention(s): Monitored during session, Premedicated before session, Repositioned, Limited activity within patient's tolerance     Hand Dominance Right   Extremity/Trunk Assessment Upper Extremity Assessment Upper Extremity Assessment: RUE deficits/detail;LUE deficits/detail (difficult to assess with patients level of lethargy) RUE Deficits / Details: was able to reach out to bed rail with abduction to 90 degrees. LUE Deficits / Details: will need to formally follow up during next assessment   Lower Extremity Assessment Lower Extremity Assessment: Defer to PT evaluation   Cervical / Trunk Assessment Cervical / Trunk Assessment: Normal   Communication Communication Communication: No difficulties   Cognition Arousal/Alertness: Lethargic Behavior During Therapy: Flat affect Overall Cognitive Status: Difficult to assess                                  General Comments: patient was noted to fall asleep at times during session with increased cues to participate. would need to further assess cog when patient is more alert     General Comments       Exercises     Shoulder Instructions      Home Living Family/patient expects to be discharged to:: Private residence Living Arrangements: Spouse/significant other Available Help at Discharge: Family;Available PRN/intermittently Type of Home: House Home Access: Stairs to enter Entrance Stairs-Number of Steps: 3   Home Layout: Able to live on main level with bedroom/bathroom     Bathroom Shower/Tub: Occupational psychologist: Standard     Home Equipment: None          Prior Functioning/Environment Prior Level of Function : Independent/Modified Independent               ADLs Comments: independent with ADL and IADLs, still driving.        OT Problem List: Decreased activity tolerance;Impaired balance (sitting and/or standing);Decreased safety awareness;Pain;Decreased knowledge of precautions;Decreased knowledge of use of DME or AE      OT Treatment/Interventions: Self-care/ADL training;Therapeutic exercise;Neuromuscular education;Energy conservation;DME and/or AE instruction;Therapeutic activities;Balance training;Patient/family education    OT Goals(Current goals can be found in the care plan section) Acute Rehab OT Goals Patient Stated Goal: none stated OT Goal Formulation: With patient Time For Goal Achievement: 02/16/22 Potential to Achieve Goals: Good  OT Frequency: Min 2X/week    Co-evaluation PT/OT/SLP Co-Evaluation/Treatment: Yes Reason for Co-Treatment: For patient/therapist safety;To address functional/ADL transfers PT goals addressed during session: Mobility/safety with mobility  OT goals addressed during session: ADL's and self-care      AM-PAC OT "6 Clicks" Daily Activity     Outcome Measure Help from another person eating meals?:  Total Help from another person taking care of personal grooming?: A Little Help from another person toileting, which includes using toliet, bedpan, or urinal?: Total Help from another person bathing (including washing, rinsing, drying)?: A Lot Help from another person to put on and taking off regular upper body clothing?: A Lot Help from another person to put on and taking off regular lower body clothing?: A Lot 6 Click Score: 11   End of Session Equipment Utilized During Treatment: Gait belt;Rolling walker (2 wheels) Nurse Communication: Mobility status  Activity Tolerance: Patient limited by lethargy;Patient limited by pain Patient left: in bed;with call bell/phone within reach;with bed alarm set  OT Visit Diagnosis: Unsteadiness on feet (R26.81);Pain;Muscle weakness (generalized) (M62.81)                Time: 6503-5465 OT Time Calculation (min): 17 min Charges:  OT General Charges $OT Visit: 1 Visit OT Evaluation $OT Eval Moderate Complexity: 1 Mod  Jackelyn Poling OTR/L, MS Acute Rehabilitation Department Office# [phone] Pager# [phone]   Marcellina Millin 02/02/2022, 3:23 PM

## 2022-02-02 NOTE — Progress Notes (Signed)
Nutrition Follow-up  DOCUMENTATION CODES:   Severe malnutrition in context of chronic illness, Underweight  INTERVENTION:   -TPN management per Pharmacy  -Will monitor for diet advancement (wants Ensure once appropriate)  NEW NUTRITION DIAGNOSIS:   Severe Malnutrition related to chronic illness, cancer and cancer related treatments as evidenced by severe fat depletion, severe muscle depletion, percent weight loss.   GOAL:   Patient will meet greater than or equal to 90% of their needs  Meeting with TPN.  MONITOR:   Labs, Weight trends, Skin, I & O's (TPN)  ASSESSMENT:   81 y.o. male with medical history of stage 4 pancreatic cancer, hydronephrosis s/p nephrostomy, HTN, SBO s/p colectomy, and stage 3 CKD. He presented to the ED due to ongoing abdominal pain x2-3 days with pain being 8/10. He also reported N/V. He was having some ostomy output. He was found to have recurrent SBO and General Surgery was consulted.  2/10: admitted 2/14: s/p dx lap, SB resection, LOA  Patient in room, slightly confused but was able to answer my questions.  Pt reports he needs some clarification on what diet he needs to follow once he is discharging. RD will provide diet information based on recommendations prior to discharge. Said he was told to limit fiber before but seems to need more explanation of what that entails.  Currently NPO. Pt states he likes Ensure, will order once diet is advanced.  TPN is currently infusing at 85 ml/hr (providing 2101 kcals and 112g protein).  Pt reports losing a lot of weight. UBW is 170 lbs. States his lowest weight has been 115 lbs. Per weight records, pt has lost 14 lbs since 10/30/21 (10% wt loss x 3 months, significant for time frame).  Current weight: 138 lbs. Admission weight was 127 lbs. Likely has increased d/t fluids. I/Os: +3.5L since admit  Medications: Lactated ringers  Labs reviewed: CBGs: 131-215 Low Na   NUTRITION - FOCUSED PHYSICAL  EXAM:  Flowsheet Row Most Recent Value  Orbital Region Severe depletion  Upper Arm Region Severe depletion  Thoracic and Lumbar Region Severe depletion  Buccal Region Severe depletion  Temple Region Severe depletion  Clavicle Bone Region Severe depletion  Clavicle and Acromion Bone Region Severe depletion  Scapular Bone Region Severe depletion  Dorsal Hand Mild depletion  [swelling]  Patellar Region Severe depletion  Anterior Thigh Region Severe depletion  Posterior Calf Region Severe depletion  Hair Reviewed  [thin]  Eyes Reviewed  Mouth Reviewed  [dry]  Skin Reviewed  Nails Reviewed       Diet Order:   Diet Order             Diet NPO time specified Except for: Ice Chips  Diet effective now                   EDUCATION NEEDS:   Education needs have been addressed  Skin:  Skin Assessment: Skin Integrity Issues: Skin Integrity Issues:: Incisions Incisions: 2/14 -abdomen  Last BM:  2/14 -colostomy  Height:   Ht Readings from Last 1 Encounters:  01/28/22 5\' 11"  (1.803 m)    Weight:   Wt Readings from Last 1 Encounters:  02/02/22 62.8 kg    Ideal Body Weight:  78.2 kg  BMI:  Body mass index is 19.31 kg/m.  Estimated Nutritional Needs:   Kcal:  2836-6294 kcal  Protein:  105-120 grams  Fluid:  >/= 2 L/day   Clayton Bibles, MS, RD, LDN Inpatient Clinical Dietitian Contact information available  via [employer]

## 2022-02-02 NOTE — Progress Notes (Addendum)
Progress Note  1 Day Post-Op  Subjective: Alert and oriented x3 but he does have some hospital delirium - thinks he was discharged and readmitted and may be "the wrong Emilio Math." He has some abdominal pain. No nausea or emesis.   No family bedside this am  Objective: Vital signs in last 24 hours: Temp:  [97.9 F (36.6 C)-98.4 F (36.9 C)] 97.9 F (36.6 C) (02/15 0403) Pulse Rate:  [75-104] 83 (02/15 0403) Resp:  [14-19] 16 (02/15 0403) BP: (98-150)/(73-105) 98/73 (02/15 0403) SpO2:  [94 %-98 %] 94 % (02/15 0403) Weight:  [62.8 kg] 62.8 kg (02/15 0403) Last BM Date : 01/29/22  Intake/Output from previous day: 02/14 0701 - 02/15 0700 In: 3260.2 [P.O.:30; I.V.:3080.2; IV Piggyback:150] Out: 1500 [Urine:825; Emesis/NG output:125; Stool:25; Blood:25] Intake/Output this shift: No intake/output data recorded.  PE: General: pleasant, chronically ill appearing, male who is laying in bed in NAD HEENT: head is normocephalic, atraumatic. Mouth is pink and moist Heart: Palpable radial pulses bilaterally Lungs: Respiratory effort nonlabored Abd: soft, ND, hypoactive BS, mild appropriate TTP. Incisions with dressings c/d/I. No air or stool in colostomy. NGT with scant bilious output MSK: all 4 extremities are symmetrical with no cyanosis, clubbing, or edema. No calf edema or TTP bilaterally Skin: warm and dry Psych: A&Ox3 with an appropriate affect.    Lab Results:  No results for input(s): WBC, HGB, HCT, PLT in the last 72 hours. BMET Recent Labs    02/01/22 0822 02/02/22 0627  NA 134* 131*  K 4.7 4.7  CL 100 102  CO2 27 21*  GLUCOSE 109* 179*  BUN 27* 44*  CREATININE 1.17 1.48*  CALCIUM 9.0 8.4*   PT/INR No results for input(s): LABPROT, INR in the last 72 hours. CMP     Component Value Date/Time   NA 131 (L) 02/02/2022 0627   NA 139 12/31/2021 1127   K 4.7 02/02/2022 0627   CL 102 02/02/2022 0627   CO2 21 (L) 02/02/2022 0627   GLUCOSE 179 (H) 02/02/2022  0627   BUN 44 (H) 02/02/2022 0627   BUN 38 (H) 12/31/2021 1127   CREATININE 1.48 (H) 02/02/2022 0627   CREATININE 1.58 (H) 11/18/2021 1149   CALCIUM 8.4 (L) 02/02/2022 0627   PROT 5.9 (L) 02/02/2022 0627   PROT 7.2 12/31/2021 1127   ALBUMIN 2.1 (L) 02/02/2022 0627   ALBUMIN 3.9 12/31/2021 1127   AST 24 02/02/2022 0627   AST 44 (H) 11/18/2021 1149   ALT 16 02/02/2022 0627   ALT 45 (H) 11/18/2021 1149   ALKPHOS 55 02/02/2022 0627   BILITOT 0.2 (L) 02/02/2022 0627   BILITOT 0.3 12/31/2021 1127   BILITOT 0.3 11/18/2021 1149   GFRNONAA 48 (L) 02/02/2022 0627   GFRNONAA 44 (L) 11/18/2021 1149   GFRAA >60 11/24/2019 1600   Lipase     Component Value Date/Time   LIPASE 42 01/28/2022 1500       Studies/Results: No results found.  Anti-infectives: Anti-infectives (From admission, onward)    Start     Dose/Rate Route Frequency Ordered Stop   02/01/22 2200  ceFAZolin (ANCEF) IVPB 1 g/50 mL premix        1 g 100 mL/hr over 30 Minutes Intravenous Every 8 hours 02/01/22 1504     02/01/22 0930  cefoTEtan (CEFOTAN) 2 g in sodium chloride 0.9 % 100 mL IVPB        2 g 200 mL/hr over 30 Minutes Intravenous On call to O.R. 01/31/22 1908  02/01/22 1533   01/30/22 1000  cefTRIAXone (ROCEPHIN) 1 g in sodium chloride 0.9 % 100 mL IVPB  Status:  Discontinued        1 g 200 mL/hr over 30 Minutes Intravenous Every 24 hours 01/29/22 0013 02/01/22 1504   01/28/22 1830  cefTRIAXone (ROCEPHIN) 1 g in sodium chloride 0.9 % 100 mL IVPB        1 g 200 mL/hr over 30 Minutes Intravenous  Once 01/28/22 1828 01/28/22 1927        Assessment/Plan Stage IV pancreatic cancer with psbo  - POD1 s/p Lap LOA, Small bowel resection, excisional biopsy of mesentery mass - operative findings of oligometastatic disease acting as a transition point in LLQ and concern for serosal 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 thickened nodularity in right abdominal wall x 2, around abdominal wall  of the colostomy, left colon colostomy mesentery, rectal stump and left retroperitoneum - suspicious for persistent focuses of carcinomatosis/metastatic disease. - follow pathology - NGT with 125 ml out charted. No gas or stool in ostomy. Continue LIWS and AROBF - continue TPN - PT/OT, IS - seems to be developing some hospital delerium  FEN: NPO/NGT ID: [email or web address] periop, ancef (UTI) VTE: lovenox  E coli UTI CKD 3A HTN   LOS: 4 days   Winferd Humphrey, [facility] 02/02/2022, 7:29 AM Please see Amion for pager number during day hours 7:00am-4:30pm

## 2022-02-02 NOTE — Care Management Important Message (Signed)
Important Message  Patient Details IM Letter given to the Patient. Name: Kevin Villegas MRN: [id number] Date of Birth: 06/28/1941   Medicare Important Message Given:  Yes     Kerin Salen 02/02/2022, 11:23 AM

## 2022-02-02 NOTE — Evaluation (Signed)
Physical Therapy Evaluation Patient Details Name: Kevin Villegas MRN: [id number] DOB: [birth year] Today's Date: 02/02/2022  History of Present Illness  Patient is a 81 year old male who presented on 01/28/22 with 2-3 day history of abdominal pain. patient was found to have SBO. patient underwent diagnostic laparoscopy with lysis of adhesions 2/14. PMH:HTN, stage 4 pancreatic cancer,CKD III, status post colectomy, hydronephrosis status post nephrostomy,  Clinical Impression  Pt admitted with above diagnosis. At baseline, pt is independent.  Today, pt significantly limited by lethargy (of note received Dilaudid IV at start of session).  Pt requiring mod-max bed mobility and Mod A of 2 for safety for OOB due to lethargy and L lateral lean.  Strength was equal bilaterally.  Recommending SNF based off of today's mobility; however, pt was independent prior and with good potential to progress to lower level of care. Pt currently with functional limitations due to the deficits listed below (see PT Problem List). Pt will benefit from skilled PT to increase their independence and safety with mobility to allow discharge to the venue listed below.          Recommendations for follow up therapy are one component of a multi-disciplinary discharge planning process, led by the attending physician.  Recommendations may be updated based on patient status, additional functional criteria and insurance authorization.  Follow Up Recommendations Skilled nursing-short term rehab (<3 hours/day) (however, eval limited by lethargy, pt with potential to progress to HHPT level)    Assistance Recommended at Discharge Frequent or constant Supervision/Assistance  Patient can return home with the following  A lot of help with walking and/or transfers;A lot of help with bathing/dressing/bathroom;Assistance with cooking/housework    Equipment Recommendations Rolling walker (2 wheels)  Recommendations for Other Services        Functional Status Assessment Patient has had a recent decline in their functional status and demonstrates the ability to make significant improvements in function in a reasonable and predictable amount of time.     Precautions / Restrictions Precautions Precautions: Fall Precaution Comments: abdominal wounds, ostomy, drain Restrictions Weight Bearing Restrictions: No      Mobility  Bed Mobility Overal bed mobility: Needs Assistance Bed Mobility: Sit to Supine, Sidelying to Sit   Sidelying to sit: Mod assist, HOB elevated   Sit to supine: Max assist   General bed mobility comments: Log roll technique to sit with multimodal cues and mod A to lift trunk. Return to supine with max A, tried log roll technique but pt just going to supine    Transfers Overall transfer level: Needs assistance Equipment used: Rolling walker (2 wheels) Transfers: Sit to/from Stand Sit to Stand: Mod assist, +2 safety/equipment           General transfer comment: light mod A to stand but leaning L and lethargic so assist of 2 for sfety    Ambulation/Gait Ambulation/Gait assistance: Mod assist, +2 safety/equipment Gait Distance (Feet): 3 Feet Assistive device: Rolling walker (2 wheels) Gait Pattern/deviations: Step-to pattern, Decreased stride length, Shuffle Gait velocity: decreased     General Gait Details: Side steps toward HOB; leaning left requirign mod A; limited due to lethargy  Stairs            Wheelchair Mobility    Modified Rankin (Stroke Patients Only)       Balance Overall balance assessment: Needs assistance Sitting-balance support: Bilateral upper extremity supported, Feet supported Sitting balance-Leahy Scale: Poor Sitting balance - Comments: leaning to L side - requiring min-mod A initially ,  once had handrail on R could do with close supervision Postural control: Left lateral lean Standing balance support: Bilateral upper extremity supported, Reliant on  assistive device for balance Standing balance-Leahy Scale: Poor Standing balance comment: continued lateral lean to L side in standing with physical assist to maintain upright posture.                             Pertinent Vitals/Pain Pain Assessment Pain Assessment: Faces Faces Pain Scale: Hurts even more Pain Location: abdomen and R shoulder with movement Pain Descriptors / Indicators: Discomfort, Grimacing, Guarding Pain Intervention(s): Limited activity within patient's tolerance, Monitored during session, Premedicated before session    [place] expects to be discharged to:: Private residence Living Arrangements: Spouse/significant other Available Help at Discharge: Family;Available PRN/intermittently Type of Home: House Home Access: Stairs to enter   Entrance Stairs-Number of Steps: 3   Home Layout: Able to live on main level with bedroom/bathroom Home Equipment: None      Prior Function Prior Level of Function : Independent/Modified Independent             Mobility Comments: Community ambulation without AD ADLs Comments: independent with ADL and IADLs, still driving.     Hand Dominance   Dominant Hand: Right    Extremity/Trunk Assessment   Upper Extremity Assessment Upper Extremity Assessment: Defer to OT evaluation RUE Deficits / Details: was able to reach out to bed rail with abduction to 90 degrees. LUE Deficits / Details: will need to formally follow up during next assessment    Lower Extremity Assessment Lower Extremity Assessment: LLE deficits/detail;RLE deficits/detail RLE Deficits / Details: ROM WFL; MMT: ankle and knee ext 5/5, hip 3/5 not further tested LLE Deficits / Details: ROM WFL; MMT: ankle and knee ext 5/5, hip 3/5 not further tested    Cervical / Trunk Assessment Cervical / Trunk Assessment: Normal  Communication   Communication: No difficulties  Cognition Arousal/Alertness: Lethargic, Suspect due to  medications Behavior During Therapy: Flat affect Overall Cognitive Status: Difficult to assess                                 General Comments: Patient was noted to fall asleep at times during session with increased cues to participate.        General Comments      Exercises     Assessment/Plan    PT Assessment Patient needs continued PT services  PT Problem List Decreased strength;Decreased mobility;Decreased safety awareness;Decreased coordination;Decreased activity tolerance;Decreased cognition;Decreased balance;Decreased knowledge of use of DME;Pain       PT Treatment Interventions DME instruction;Therapeutic activities;Gait training;Therapeutic exercise;Patient/family education;Stair training;Balance training;Functional mobility training    PT Goals (Current goals can be found in the Care Plan section)  Acute Rehab PT Goals Patient Stated Goal: decrease pain PT Goal Formulation: With patient Time For Goal Achievement: 02/16/22 Potential to Achieve Goals: Good    Frequency Min 3X/week     Co-evaluation PT/OT/SLP Co-Evaluation/Treatment: Yes Reason for Co-Treatment: For patient/therapist safety;Other (comment) (pain control) PT goals addressed during session: Mobility/safety with mobility OT goals addressed during session: ADL's and self-care       AM-PAC PT "6 Clicks" Mobility  Outcome Measure Help needed turning from your back to your side while in a flat bed without using bedrails?: A Little Help needed moving from lying on your back to sitting on the side  of a flat bed without using bedrails?: A Lot Help needed moving to and from a bed to a chair (including a wheelchair)?: Total Help needed standing up from a chair using your arms (e.g., wheelchair or bedside chair)?: Total Help needed to walk in hospital room?: Total Help needed climbing 3-5 steps with a railing? : Total 6 Click Score: 9    End of Session Equipment Utilized During  Treatment: Gait belt Activity Tolerance: Patient limited by lethargy Patient left: in bed;with call bell/phone within reach;with bed alarm set Nurse Communication: Mobility status PT Visit Diagnosis: Other abnormalities of gait and mobility (R26.89);Muscle weakness (generalized) (M62.81)    Time: 5883-2549 PT Time Calculation (min) (ACUTE ONLY): 17 min   Charges:   PT Evaluation $PT Eval Moderate Complexity: 1 Melina Schools, PT Acute Rehab Services Pager [phone] [facility] [phone]   Karlton Lemon 02/02/2022, 3:44 PM

## 2022-02-02 NOTE — Progress Notes (Signed)
°  Progress Note   Patient: Kevin Villegas ULA:[id number] DOB: 06/20/1941 DOA: 01/28/2022     4 DOS: the patient was seen and examined on 02/02/2022   Brief hospital course: 81 y.o. male with medical history significant of stage 4 pancreatic cancer, status post colectomy, hydronephrosis status post nephrostomy, hypertension, SBO, CKD 3A presents with ongoing abdominal pain.  Patient states he has had 2 to 3 days of increased abdominal pain.  Pain 8 out of 10 today.  He is concerned for issues with his known small bowel obstruction as he has a history of the same.  He states he does have some output in his ostomy still.  Has had some nausea and vomiting. Pt was found to have SBO. General Surgery consulted  Assessment and Plan:  Small bowel obstruction > Patient presented with abdominal pain for the past couple days he was concern for possible small bowel obstruction as he has had similar episodes in the past. > CT did not demonstrate small bowel obstruction.  General surgery following -Difficult situation given prior surgical history related to stage 4 pancreatic cancer. -Appreciate input by Oncology -Pt to undergo diagnostic laparoscopy with lysis of adhesions 2/14 -Now on TPN for nutritional support -NG suction -We will follow general surgery recommendation   CKD 3A > Creatinine stable 1.5 in ED - Avoid nephrotoxic agents - Cr improved with hydration -recheck bmet in AM  Hydronephrosis > Status post left nephrostomy -Cont to follow bmet trends  Stage 4 Pancreatic cancer > Known metastatic pancreatic cancer.  Follows with oncology, Dr. Marin Olp. - Appreciate input by Dr. Marin Olp   Hypertension - Not currently any medication for this -BP stable and controlled at this time  Ecoli UTI -UA suggestive of UTI -urine cx pos for pan-sensitive ecoli -Currently on cefazolin     Subjective: POD#1, on ng suction, on tpn, appears in a lot of pain, getting iv analgesics  currently   Physical Exam: Vitals:   02/01/22 1430 02/01/22 1445 02/01/22 2000 02/02/22 0403  BP: (!) 126/97 (!) 125/93 101/82 98/73  Pulse: (!) 104 99 89 83  Resp: 18 17 18 16   Temp:   97.9 F (36.6 C) 97.9 F (36.6 C)  TempSrc:   Oral Oral  SpO2: 95% 94% 94% 94%  Weight:    62.8 kg  Height:       General exam: in pain Respiratory system: normal chest rise, clear, no audible wheezing Cardiovascular system: regular rhythm, s1-s2 Gastrointestinal system: + colostomy, ab dressing, I did not hear any bowel sounds Central nervous system: No seizures, no tremors Extremities: No cyanosis, no joint deformities Skin: No rashes, no pallor Psychiatry: Affect normal // no auditory hallucinations   Data Reviewed:  Labs reviewed, Cr improved  Family Communication: Pt in room, family not at bedside  Disposition: Status is: Inpatient Requires inpatient because: Severity of illness    Planned Discharge Destination: Home     Author: Florencia Reasons, MD PhD FACP 02/02/2022 10:19 AM  For on call review [email or web address].

## 2022-02-02 NOTE — Progress Notes (Signed)
PHARMACY - TOTAL PARENTERAL NUTRITION CONSULT NOTE   Indication: Small bowel obstruction and inability to meet nutritional needs w/ PO intake  Patient Measurements: Height: 5' 11"  (180.3 cm) Weight: 62.8 kg (138 lb 7.2 oz) IBW/kg (Calculated) : 75.3 TPN AdjBW (KG): 57.7 Body mass index is 19.31 kg/m. Usual Weight: Weight noted in November 2022 to be 63.5 kg  Assessment: 81 yo male presenting to [facility] ED for abdominal pain, nausea, vomiting. PMH includes pancreatic cancer with evidence of metastatic disease to his bladder, lungs, and pleural space and history of SBO.  S/p stent placement between stomach and intestines and colostomy placement in June 2022. Pharmacy is consulted to begin TPN for possible bridge to surgery given low pre-albumin and inability to meet nutritional needs w/ PO intake.   Glucose / Insulin: No hx DM; 10 units sensitive SSI required in past 24 hours. CBGs above goal range, likely secondary to Dexamethasone given in OR yesterday (range 131-215).  Electrolytes: Na low/decreased to 131. K 4.7, unchanged. Mag slightly decreased to 1.9. CO2 decreased to 21. Others, including Corrected Calcium, WNL.    Renal: SCr increased to 1.48, BUN increased to 44. Marland Kitchen Hepatic: AST/ALT, Alk Phos WNL. Tbili low. Pre-albumin low at 8.2 (2/11); albumin low at 2.1. TG 115 (2/13) Intake / Output; MIVF: I/O 3260/1500. 173m output from NG tube. [facility].  GI Imaging: - 2/10 CTAP: re-demonstration of high-grade partial SBO with transition point in the upper left pelvis psych likely due to adhesion.  GI Surgeries / Procedures:  - 2/14: laparoscopic lysis of adhesions, small bowel resection, excisional bipsy of mesentery mass   Central access: PAC TPN start date: 2/12  Nutritional Goals: Goal TPN rate is 85 mL/hr (provides 112g of protein and 2101 kcals per day)  RD Assessment:  Estimated Needs Total Energy Estimated Needs: 2050-2250  kcal Total Protein Estimated Needs: 105-120 grams Total Fluid Estimated Needs: >/= 2 L/day  Current Nutrition:  NPO except ice chips  TPN  Plan:  Continue TPN at goal rate of 873mhr at 1800 Electrolytes in TPN:  Na 7580mL (increased)  K 19m9m (decreased) Ca 2mEq66mMg 8mEq/72mincreased) Phos 15mmol65ml:Ac 1:2 Add standard MVI and trace elements to TPN Continue Sensitive q4h SSI and adjust as needed. If CBGs elevated tomorrow, will adjust SSI regimen.  IVF per MD (none ordered at this time) Monitor TPN labs on Mon/Thurs   Devante Capano GLindell SparD, BCPS Clinical Pharmacist 02/02/2022,7:30 AM

## 2022-02-03 ENCOUNTER — Other Ambulatory Visit (HOSPITAL_COMMUNITY): Payer: Medicare Other

## 2022-02-03 DIAGNOSIS — K56609 Unspecified intestinal obstruction, unspecified as to partial versus complete obstruction: Secondary | ICD-10-CM | POA: Diagnosis not present

## 2022-02-03 LAB — PHOSPHORUS: Phosphorus: 3.8 mg/dL (ref 2.5–4.6)

## 2022-02-03 LAB — GLUCOSE, CAPILLARY
Glucose-Capillary: 120 mg/dL — ABNORMAL HIGH (ref 70–99)
Glucose-Capillary: 121 mg/dL — ABNORMAL HIGH (ref 70–99)
Glucose-Capillary: 125 mg/dL — ABNORMAL HIGH (ref 70–99)
Glucose-Capillary: 127 mg/dL — ABNORMAL HIGH (ref 70–99)
Glucose-Capillary: 130 mg/dL — ABNORMAL HIGH (ref 70–99)
Glucose-Capillary: 144 mg/dL — ABNORMAL HIGH (ref 70–99)

## 2022-02-03 LAB — COMPREHENSIVE METABOLIC PANEL
ALT: 11 U/L (ref 0–44)
AST: 22 U/L (ref 15–41)
Albumin: 1.8 g/dL — ABNORMAL LOW (ref 3.5–5.0)
Alkaline Phosphatase: 56 U/L (ref 38–126)
Anion gap: 6 (ref 5–15)
BUN: 47 mg/dL — ABNORMAL HIGH (ref 8–23)
CO2: 24 mmol/L (ref 22–32)
Calcium: 8.4 mg/dL — ABNORMAL LOW (ref 8.9–10.3)
Chloride: 102 mmol/L (ref 98–111)
Creatinine, Ser: 1.5 mg/dL — ABNORMAL HIGH (ref 0.61–1.24)
GFR, Estimated: 47 mL/min — ABNORMAL LOW (ref >60)
Glucose, Bld: 142 mg/dL — ABNORMAL HIGH (ref 70–99)
Potassium: 4.2 mmol/L (ref 3.5–5.1)
Sodium: 132 mmol/L — ABNORMAL LOW (ref 135–145)
Total Bilirubin: 0.2 mg/dL — ABNORMAL LOW (ref 0.3–1.2)
Total Protein: 5.3 g/dL — ABNORMAL LOW (ref 6.5–8.1)

## 2022-02-03 LAB — CBC
HCT: 32.2 % — ABNORMAL LOW (ref 39.0–52.0)
Hemoglobin: 10.5 g/dL — ABNORMAL LOW (ref 13.0–17.0)
MCH: 31 pg (ref 26.0–34.0)
MCHC: 32.6 g/dL (ref 30.0–36.0)
MCV: 95 fL (ref 80.0–100.0)
Platelets: 378 10*3/uL (ref 150–400)
RBC: 3.39 MIL/uL — ABNORMAL LOW (ref 4.22–5.81)
RDW: 17.3 % — ABNORMAL HIGH (ref 11.5–15.5)
WBC: 20.9 10*3/uL — ABNORMAL HIGH (ref 4.0–10.5)
nRBC: 0 % (ref 0.0–0.2)

## 2022-02-03 LAB — MAGNESIUM: Magnesium: 2.1 mg/dL (ref 1.7–2.4)

## 2022-02-03 LAB — LACTIC ACID, PLASMA: Lactic Acid, Venous: 1.1 mmol/L (ref 0.5–1.9)

## 2022-02-03 LAB — SURGICAL PATHOLOGY

## 2022-02-03 MED ORDER — TRAVASOL 10 % IV SOLN
INTRAVENOUS | Status: AC
  Filled 2022-02-03: qty 1122

## 2022-02-03 NOTE — Progress Notes (Signed)
°  Progress Note   Patient: Kevin Villegas INO:[id number] DOB: 03/02/1941 DOA: 01/28/2022     5 DOS: the patient was seen and examined on 02/03/2022   Brief hospital course: 81 y.o. male with medical history significant of stage 4 pancreatic cancer, status post colectomy, hydronephrosis status post nephrostomy, hypertension, SBO, CKD 3A presents with ongoing abdominal pain.  Patient states he has had 2 to 3 days of increased abdominal pain.  Pain 8 out of 10 today.  He is concerned for issues with his known small bowel obstruction as he has a history of the same.  He states he does have some output in his ostomy still.  Has had some nausea and vomiting. Pt was found to have SBO. General Surgery consulted  Assessment and Plan:  Small bowel obstruction > Patient presented with abdominal pain for the past couple days he was concern for possible small bowel obstruction as he has had similar episodes in the past. > CT did not demonstrate small bowel obstruction.  General surgery following -Difficult situation given prior surgical history related to stage 4 pancreatic cancer. -Appreciate input by Oncology -Pt to undergo diagnostic laparoscopy with lysis of adhesions 2/14 -Now on TPN for nutritional support -NG suction -We will follow general surgery recommendation   CKD 3A > Creatinine stable 1.5 in ED - Avoid nephrotoxic agents - Cr improved with hydration -recheck bmet in AM  Hydronephrosis > Status post left nephrostomy -Cont to follow bmet trends  Stage 4 Pancreatic cancer > Known metastatic pancreatic cancer.  Follows with oncology, Dr. Marin Olp. - Appreciate input by Dr. Marin Olp   Hypertension - Not currently any medication for this -BP stable and controlled at this time  Ecoli UTI -UA suggestive of UTI -urine cx pos for pan-sensitive ecoli -Currently on cefazolin     Subjective: POD#2, on ng suction, on tpn, no bm, no flatus, still reports pain, getting prn  iv analgesics     Physical Exam: Vitals:   02/02/22 1300 02/02/22 2007 02/03/22 0432 02/03/22 1527  BP: 115/72 107/70 123/75 101/73  Pulse: 69 78 75 72  Resp: 16 16 14 17   Temp: (!) 97.5 F (36.4 C) 97.8 F (36.6 C) 97.8 F (36.6 C) (!) 97.5 F (36.4 C)  TempSrc: Axillary Oral Oral Oral  SpO2: 93% 95% 96% 96%  Weight:   65.7 kg   Height:       General exam: in pain Respiratory system: normal chest rise, clear, no audible wheezing Cardiovascular system: regular rhythm, s1-s2 Gastrointestinal system: + colostomy, ab dressing, I did not hear any bowel sounds Central nervous system: No seizures, no tremors Extremities: No cyanosis, no joint deformities Skin: No rashes, no pallor Psychiatry: Affect normal // no auditory hallucinations   Data Reviewed:  Labs reviewed, Cr improved  Family Communication: Pt in room, family not at bedside  Disposition: Status is: Inpatient Requires inpatient because: Severity of illness    Planned Discharge Destination: Home     Author: Florencia Reasons, MD PhD FACP 02/03/2022 7:21 PM  For on call review [email or web address].

## 2022-02-03 NOTE — TOC Initial Note (Signed)
Transition of Care [facility]) - Initial/Assessment Note    Patient Details  Name: Kevin Villegas MRN: [id number] Date of Birth: Jun 23, 1941  Transition of [facility]) CM/SW Contact:    Lynnell Catalan, RN Phone Number: 02/03/2022, 10:56 AM  Clinical Narrative:                 TOC is following for dc planning. Pt still has medical complexity and appears to be quite a ways from discharge. Pt was independent prior to admission and may progress to a higher level of mobility prior to dc. Will continue to follow.  Expected Discharge Plan: Skilled Nursing Facility Barriers to Discharge: Continued Medical Work up     Expected Discharge Plan and Services Expected Discharge Plan: [place]   Discharge Planning Services: CM Consult          Activities of Daily Living Home Assistive Devices/Equipment: None ADL Screening (condition at time of admission) Patient's cognitive ability adequate to safely complete daily activities?: Yes Is the patient deaf or have difficulty hearing?: No Does the patient have difficulty seeing, even when wearing glasses/contacts?: No Does the patient have difficulty concentrating, remembering, or making decisions?: No Patient able to express need for assistance with ADLs?: Yes Does the patient have difficulty dressing or bathing?: No Independently performs ADLs?: Yes (appropriate for developmental age) Does the patient have difficulty walking or climbing stairs?: No Weakness of Legs: None Weakness of Arms/Hands: None    Admission diagnosis:  SBO (small bowel obstruction) ([place]) [K56.609] Intestinal obstruction, unspecified cause, unspecified whether partial or complete ([place]) [K56.609] Patient Active Problem List   Diagnosis Date Noted   E-coli UTI 02/01/2022   SBO (small bowel obstruction) ([place]) 01/28/2022   Subcutaneous nodule of abdominal wall 01/09/2022   Partial small bowel obstruction ([place]) 01/07/2022   COVID-19 virus infection 01/07/2022    Colostomy in place ([place]) 11/10/2021   Stage 3a chronic kidney disease (CKD) ([place]) 11/10/2021   Pyelonephritis    Essential hypertension 10/30/2021   Hydronephrosis of left kidney 10/28/2021   Ureteral stricture, left 10/27/2021   Protein-calorie malnutrition, severe 06/12/2021   Hydroureter on right 06/10/2021   Aortic atherosclerosis ([place]) 06/10/2021   Right ureteral stone 06/02/2021   Colon stricture ([place])    Pancreatic mass 06/27/2019   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  with metastatic disease 06/27/2019   Rhinitis medicamentosa 02/26/2019   PCP:  Dettinger, [name], MD Pharmacy:   [facility] [address] [place] [address] [place] [address] Phone: [phone] Fax: [phone]     Social Determinants of Health (SDOH) Interventions    Readmission Risk Interventions Readmission Risk Prevention Plan 02/03/2022 06/16/2021 06/11/2021  Transportation Screening Complete Complete Complete  PCP or Specialist Appt within 3-5 Days - - Complete  HRI or Home Care Consult - - Complete  Social Work Consult for Recovery Care Planning/Counseling - - Complete  Palliative Care Screening - - Not Applicable  Medication Review Press photographer) Complete Complete Complete  PCP or Specialist appointment within 3-5 days of discharge Complete Complete -  HRI or Home Care Consult Complete Complete -  SW Recovery Care/Counseling Consult Complete Complete -  Palliative Care Screening Not Applicable Not Applicable -  [place] Not Applicable Not Applicable -  Some recent data might be hidden

## 2022-02-03 NOTE — Progress Notes (Signed)
Progress Note  2 Days Post-Op  Subjective: Alert and oriented x3. Does not remember me from yesterday but seems less confused this am. He complains of his abdomen feeling "sore" but similar to yesterday. Worked with therapies yesterday. No nausea or emesis. No flatus or stool output.   No family bedside this am  Objective: Vital signs in last 24 hours: Temp:  [97.5 F (36.4 C)-97.8 F (36.6 C)] 97.8 F (36.6 C) (02/16 0432) Pulse Rate:  [69-78] 75 (02/16 0432) Resp:  [14-16] 14 (02/16 0432) BP: (107-123)/(70-75) 123/75 (02/16 0432) SpO2:  [93 %-96 %] 96 % (02/16 0432) Weight:  [65.7 kg] 65.7 kg (02/16 0432) Last BM Date : 01/29/22  Intake/Output from previous day: 02/15 0701 - 02/16 0700 In: 3524 [I.V.:2028.2; IV Piggyback:1495.7] Out: 1110 [Urine:910; Emesis/NG output:200] Intake/Output this shift: No intake/output data recorded.  PE: General: pleasant, chronically ill appearing, male who is laying in bed in NAD HEENT: head is normocephalic, atraumatic. Mouth is pink and moist Heart: Palpable radial pulses bilaterally Lungs: Respiratory effort nonlabored Abd: soft, ND, hypoactive BS, mild appropriate TTP. Incisions with dressings c/d/I. No air or stool in colostomy. NGT with bilious output MSK: all 4 extremities are symmetrical with no cyanosis, clubbing, or edema. No calf edema or TTP bilaterally Skin: warm and dry Psych: A&Ox3 with an appropriate affect.    Lab Results:  Recent Labs    02/03/22 0523  WBC 20.9*  HGB 10.5*  HCT 32.2*  PLT 378   BMET Recent Labs    02/02/22 0627 02/03/22 0523  NA 131* 132*  K 4.7 4.2  CL 102 102  CO2 21* 24  GLUCOSE 179* 142*  BUN 44* 47*  CREATININE 1.48* 1.50*  CALCIUM 8.4* 8.4*    PT/INR No results for input(s): LABPROT, INR in the last 72 hours. CMP     Component Value Date/Time   NA 132 (L) 02/03/2022 0523   NA 139 12/31/2021 1127   K 4.2 02/03/2022 0523   CL 102 02/03/2022 0523   CO2 24 02/03/2022 0523    GLUCOSE 142 (H) 02/03/2022 0523   BUN 47 (H) 02/03/2022 0523   BUN 38 (H) 12/31/2021 1127   CREATININE 1.50 (H) 02/03/2022 0523   CREATININE 1.58 (H) 11/18/2021 1149   CALCIUM 8.4 (L) 02/03/2022 0523   PROT 5.3 (L) 02/03/2022 0523   PROT 7.2 12/31/2021 1127   ALBUMIN 1.8 (L) 02/03/2022 0523   ALBUMIN 3.9 12/31/2021 1127   AST 22 02/03/2022 0523   AST 44 (H) 11/18/2021 1149   ALT 11 02/03/2022 0523   ALT 45 (H) 11/18/2021 1149   ALKPHOS 56 02/03/2022 0523   BILITOT 0.2 (L) 02/03/2022 0523   BILITOT 0.3 12/31/2021 1127   BILITOT 0.3 11/18/2021 1149   GFRNONAA 47 (L) 02/03/2022 0523   GFRNONAA 44 (L) 11/18/2021 1149   GFRAA >60 11/24/2019 1600   Lipase     Component Value Date/Time   LIPASE 42 01/28/2022 1500       Studies/Results: DG Abd 1 View  Result Date: 02/02/2022 CLINICAL DATA:  Abdominal pain EXAM: ABDOMEN - 1 VIEW COMPARISON:  CT abdomen and pelvis 01/28/2022 FINDINGS: Abnormally distended gas-filled loops of small bowel in the mid abdomen measuring up to 6.7 cm in diameter in the supine position. Enteric tube tip is in the region of the stomach. Left-sided percutaneous nephrostomy tube and ureteral stent. Cholecystectomy clips. Vascular stent graft in the left mid abdomen. IMPRESSION: Abnormal bowel gas pattern suggesting small bowel obstruction  or regional ileus. Electronically Signed   By: Ofilia Neas M.D.   On: 02/02/2022 11:08    Anti-infectives: Anti-infectives (From admission, onward)    Start     Dose/Rate Route Frequency Ordered Stop   02/01/22 2200  ceFAZolin (ANCEF) IVPB 1 g/50 mL premix        1 g 100 mL/hr over 30 Minutes Intravenous Every 8 hours 02/01/22 1504     02/01/22 0930  cefoTEtan (CEFOTAN) 2 g in sodium chloride 0.9 % 100 mL IVPB        2 g 200 mL/hr over 30 Minutes Intravenous On call to O.R. 01/31/22 1908 02/01/22 1533   01/30/22 1000  cefTRIAXone (ROCEPHIN) 1 g in sodium chloride 0.9 % 100 mL IVPB  Status:  Discontinued        1  g 200 mL/hr over 30 Minutes Intravenous Every 24 hours 01/29/22 0013 02/01/22 1504   01/28/22 1830  cefTRIAXone (ROCEPHIN) 1 g in sodium chloride 0.9 % 100 mL IVPB        1 g 200 mL/hr over 30 Minutes Intravenous  Once 01/28/22 1828 01/28/22 1927        Assessment/Plan Stage IV pancreatic cancer with psbo  - POD2 s/p Lap LOA, Small bowel resection, excisional biopsy of mesentery mass - operative findings of oligometastatic disease acting as a transition point in LLQ and concern for serosal 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 thickened nodularity in right abdominal wall x 2, around abdominal wall of the colostomy, left colon colostomy mesentery, rectal stump and left retroperitoneum - suspicious for persistent focuses of carcinomatosis/metastatic disease. - follow pathology - afebrile (on schd tylenol), WBC 20.9 (14.0), lactic acid normal. Continue to monitor abd exam and WBC - abd xray 2/15 with abnormal gas pattern - NGT with 200 ml out charted. No gas or stool in ostomy. Continue LIWS and AROBF - continue TPN - PT/OT - recc snf with possible progression to [place]  FEN: NPO/NGT LIWS, TPN ID: cefotetan periop, ancef (UTI) VTE: lovenox  E coli UTI CKD 3A HTN   LOS: 5 days   Winferd Humphrey, [facility] Surgery 02/03/2022, 7:53 AM Please see Amion for pager number during day hours 7:00am-4:30pm

## 2022-02-03 NOTE — Progress Notes (Signed)
[name] still has an NG tube in.  He is on TNA.  He is still quite sore.  We are awaiting the pathology from the surgery.  He will be interesting to see if we can send off the specimen for molecular studies.  His labs show a BUN of 47 creatinine 1.5.  His albumin is only 1.8.  His white cell count is 20.9.  Hemoglobin 10.5.  Platelet count 378,000.  His abdomen is slightly distended.  I really do not hear much in the way of bowel sounds.  There is no cough.  He has had no fever.,  Sure is really out of bed yet.  His vital signs show temperature 97.8.  Pulse 75.  Blood pressure 123/75.  His lungs sound relatively clear bilaterally.  Cardiac exam regular rate and rhythm.  Extremity shows no clubbing, cyanosis or edema.  I talked to him about utilizing chemotherapy after surgery.  He would be agreeable to this.  I think one of the problems is that he clearly has more disease than what our scans have shown.  His tumor marker really has not been all that bad.  However, his CEA is quite elevated so this is probably going to have to use for a measure response.  I suspect she probably will be hospitalized for several more days at least.  Again, I do appreciate the incredible talents of Dr. [name].  I appreciate the wonderful care that [name] is getting from all the staff up on 6 E.  Kevin Haw, MD  Kevin Villegas 57:19

## 2022-02-03 NOTE — Progress Notes (Signed)
PHARMACY - TOTAL PARENTERAL NUTRITION CONSULT NOTE   Indication: Small bowel obstruction and inability to meet nutritional needs w/ PO intake  Patient Measurements: Height: 5' 11"  (180.3 cm) Weight: 65.7 kg (144 lb 13.5 oz) IBW/kg (Calculated) : 75.3 TPN AdjBW (KG): 57.7 Body mass index is 20.2 kg/m. Usual Weight: Weight noted in November 2022 to be 63.5 kg  Assessment: 81 yo male presenting to [facility] ED for abdominal pain, nausea, vomiting. PMH includes pancreatic cancer with evidence of metastatic disease to his bladder, lungs, and pleural space and history of SBO.  S/p stent placement between stomach and intestines and colostomy placement in June 2022. Pharmacy is consulted to begin TPN for possible bridge to surgery given low pre-albumin and inability to meet nutritional needs w/ PO intake.   Glucose / Insulin: No hx DM; 5 units sensitive SSI required in past 24 hours. CBGs improved (range 115-186).  -CBGs likely above goal range prior day secondary to Dexamethasone given in OR 2/14  Electrolytes: Na low/slightly increased to 132. Others, including Corrected Calcium, WNL.    Renal: SCr increased to 1.5, BUN increased to 47. Marland Kitchen Hepatic: AST/ALT, Alk Phos WNL. Tbili low. Pre-albumin low at 8.2 (2/11); albumin low at 1.8. TG 115 (2/13) Intake / Output; MIVF: I/O 3524/1110. 250m output from NG tube. [facility].  GI Imaging: - 2/10 CT abdomen pelvis: re-demonstration of high-grade partial SBO with transition point in the upper left pelvis psych likely due to adhesion.  GI Surgeries / Procedures:  - 2/14: laparoscopic lysis of adhesions, small bowel resection, excisional bipsy of mesentery mass   Central access: PAC TPN start date: 2/12  Nutritional Goals: Goal TPN rate is 85 mL/hr (provides 112g of protein and 2101 kcals per day)  RD Assessment:  Estimated Needs Total Energy Estimated Needs: 2050-2250 kcal Total Protein Estimated Needs:  105-120 grams Total Fluid Estimated Needs: >/= 2 L/day  Current Nutrition:  NPO except ice chips  TPN  Plan:  Continue TPN at goal rate of 82mhr at 1800 Electrolytes in TPN:  Na 10057mL (increased)  K 54m81m  Ca 2mEq64mMg 8mEq/15mPhos 15mmol19ml:Ac 1:2 Add standard trace elements daily to TPN. Add MVI to TPN on M/W/F only due to national shortage.  Continue Sensitive q4h SSI and adjust as needed. IVF per MD (none ordered at this time) Monitor TPN labs on Mon/Thurs BMET, Mg, Phos in AM   Shirley Bolle GLindell SparD, BCPS Clinical Pharmacist 02/03/2022,7:31 AM

## 2022-02-04 ENCOUNTER — Inpatient Hospital Stay (HOSPITAL_COMMUNITY): Payer: Medicare Other

## 2022-02-04 ENCOUNTER — Encounter: Payer: Self-pay | Admitting: *Deleted

## 2022-02-04 DIAGNOSIS — K56609 Unspecified intestinal obstruction, unspecified as to partial versus complete obstruction: Secondary | ICD-10-CM | POA: Diagnosis not present

## 2022-02-04 LAB — CBC
HCT: 34 % — ABNORMAL LOW (ref 39.0–52.0)
Hemoglobin: 10.8 g/dL — ABNORMAL LOW (ref 13.0–17.0)
MCH: 30.5 pg (ref 26.0–34.0)
MCHC: 31.8 g/dL (ref 30.0–36.0)
MCV: 96 fL (ref 80.0–100.0)
Platelets: 476 10*3/uL — ABNORMAL HIGH (ref 150–400)
RBC: 3.54 MIL/uL — ABNORMAL LOW (ref 4.22–5.81)
RDW: 17.5 % — ABNORMAL HIGH (ref 11.5–15.5)
WBC: 23 10*3/uL — ABNORMAL HIGH (ref 4.0–10.5)
nRBC: 0.1 % (ref 0.0–0.2)

## 2022-02-04 LAB — BASIC METABOLIC PANEL
Anion gap: 6 (ref 5–15)
BUN: 40 mg/dL — ABNORMAL HIGH (ref 8–23)
CO2: 25 mmol/L (ref 22–32)
Calcium: 8.6 mg/dL — ABNORMAL LOW (ref 8.9–10.3)
Chloride: 102 mmol/L (ref 98–111)
Creatinine, Ser: 1.31 mg/dL — ABNORMAL HIGH (ref 0.61–1.24)
GFR, Estimated: 55 mL/min — ABNORMAL LOW (ref >60)
Glucose, Bld: 159 mg/dL — ABNORMAL HIGH (ref 70–99)
Potassium: 4 mmol/L (ref 3.5–5.1)
Sodium: 133 mmol/L — ABNORMAL LOW (ref 135–145)

## 2022-02-04 LAB — PHOSPHORUS: Phosphorus: 3.3 mg/dL (ref 2.5–4.6)

## 2022-02-04 LAB — GLUCOSE, CAPILLARY
Glucose-Capillary: 127 mg/dL — ABNORMAL HIGH (ref 70–99)
Glucose-Capillary: 131 mg/dL — ABNORMAL HIGH (ref 70–99)
Glucose-Capillary: 139 mg/dL — ABNORMAL HIGH (ref 70–99)
Glucose-Capillary: 145 mg/dL — ABNORMAL HIGH (ref 70–99)
Glucose-Capillary: 148 mg/dL — ABNORMAL HIGH (ref 70–99)
Glucose-Capillary: 155 mg/dL — ABNORMAL HIGH (ref 70–99)
Glucose-Capillary: 171 mg/dL — ABNORMAL HIGH (ref 70–99)

## 2022-02-04 LAB — MAGNESIUM: Magnesium: 1.9 mg/dL (ref 1.7–2.4)

## 2022-02-04 MED ORDER — INSULIN ASPART 100 UNIT/ML IJ SOLN
0.0000 [IU] | Freq: Four times a day (QID) | INTRAMUSCULAR | Status: DC
  Administered 2022-02-04 (×2): 1 [IU] via SUBCUTANEOUS
  Administered 2022-02-04: 2 [IU] via SUBCUTANEOUS
  Administered 2022-02-05: 1 [IU] via SUBCUTANEOUS
  Administered 2022-02-05: 2 [IU] via SUBCUTANEOUS
  Administered 2022-02-05: 1 [IU] via SUBCUTANEOUS
  Administered 2022-02-06: 2 [IU] via SUBCUTANEOUS
  Administered 2022-02-06: 1 [IU] via SUBCUTANEOUS
  Administered 2022-02-06 – 2022-02-07 (×3): 2 [IU] via SUBCUTANEOUS

## 2022-02-04 MED ORDER — FENTANYL CITRATE PF 50 MCG/ML IJ SOSY: 25.0000 ug | PREFILLED_SYRINGE | INTRAMUSCULAR | Status: DC | PRN

## 2022-02-04 MED ORDER — METHOCARBAMOL 1000 MG/10ML IJ SOLN
1000.0000 mg | Freq: Three times a day (TID) | INTRAMUSCULAR | Status: DC
  Administered 2022-02-04 – 2022-02-10 (×16): 1000 mg via INTRAVENOUS
  Filled 2022-02-04 (×2): qty 1000
  Filled 2022-02-04: qty 10
  Filled 2022-02-04 (×10): qty 1000
  Filled 2022-02-04 (×2): qty 10
  Filled 2022-02-04 (×2): qty 1000
  Filled 2022-02-04: qty 10
  Filled 2022-02-04 (×2): qty 1000

## 2022-02-04 MED ORDER — BIOTENE DRY MOUTH MT LIQD: 15.0000 mL | OROMUCOSAL | Status: DC | PRN

## 2022-02-04 MED ORDER — TRAVASOL 10 % IV SOLN
INTRAVENOUS | Status: AC
  Filled 2022-02-04: qty 1122

## 2022-02-04 MED ORDER — ADULT MULTIVITAMIN W/MINERALS CH
1.0000 | ORAL_TABLET | Freq: Every day | ORAL | Status: DC
  Filled 2022-02-04: qty 1

## 2022-02-04 MED ORDER — MAGIC MOUTHWASH
15.0000 mL | Freq: Three times a day (TID) | ORAL | Status: AC
  Administered 2022-02-04 – 2022-02-07 (×9): 15 mL via ORAL
  Filled 2022-02-04 (×9): qty 15

## 2022-02-04 MED ORDER — FENTANYL CITRATE PF 50 MCG/ML IJ SOSY
25.0000 ug | PREFILLED_SYRINGE | INTRAMUSCULAR | Status: DC | PRN
  Administered 2022-02-05: 50 ug via INTRAVENOUS
  Administered 2022-02-05: 25 ug via INTRAVENOUS
  Administered 2022-02-05: 35 ug via INTRAVENOUS
  Administered 2022-02-06 – 2022-02-13 (×26): 50 ug via INTRAVENOUS
  Administered 2022-02-14 (×2): 25 ug via INTRAVENOUS
  Administered 2022-02-15: 50 ug via INTRAVENOUS
  Administered 2022-02-15: 25 ug via INTRAVENOUS
  Administered 2022-02-15 – 2022-02-19 (×6): 50 ug via INTRAVENOUS
  Administered 2022-02-19: 25 ug via INTRAVENOUS
  Administered 2022-02-19: 50 ug via INTRAVENOUS
  Filled 2022-02-04 (×42): qty 1

## 2022-02-04 MED ORDER — ACETAMINOPHEN 10 MG/ML IV SOLN
1000.0000 mg | Freq: Four times a day (QID) | INTRAVENOUS | Status: AC
  Administered 2022-02-04 – 2022-02-05 (×4): 1000 mg via INTRAVENOUS
  Filled 2022-02-04 (×4): qty 100

## 2022-02-04 MED ORDER — LACTATED RINGERS IV BOLUS: 1000.0000 mL | Freq: Three times a day (TID) | INTRAVENOUS | Status: AC | PRN

## 2022-02-04 MED ORDER — LACTATED RINGERS IV BOLUS
1000.0000 mL | Freq: Three times a day (TID) | INTRAVENOUS | Status: AC | PRN
Start: 2022-02-04 — End: 2022-02-06

## 2022-02-04 MED ORDER — PIPERACILLIN-TAZOBACTAM 3.375 G IVPB
3.3750 g | Freq: Three times a day (TID) | INTRAVENOUS | Status: AC
Start: 2022-02-04 — End: 2022-02-09
  Administered 2022-02-04 – 2022-02-09 (×14): 3.375 g via INTRAVENOUS
  Filled 2022-02-04 (×16): qty 50

## 2022-02-04 MED ORDER — MIRTAZAPINE 15 MG PO TABS: 15.0000 mg | ORAL_TABLET | Freq: Every day | ORAL | Status: DC

## 2022-02-04 MED ORDER — LIDOCAINE 5 % EX PTCH
1.0000 | MEDICATED_PATCH | CUTANEOUS | Status: DC
  Administered 2022-02-04 – 2022-02-24 (×16): 1 via TRANSDERMAL
  Filled 2022-02-04 (×21): qty 1

## 2022-02-04 MED ORDER — MAGIC MOUTHWASH
15.0000 mL | Freq: Four times a day (QID) | ORAL | Status: DC
  Administered 2022-02-04: 15 mL via ORAL
  Filled 2022-02-04 (×2): qty 15

## 2022-02-04 NOTE — Progress Notes (Signed)
°  Progress Note   Patient: Kevin Villegas HRC:[id number] DOB: [birth year] DOA: 01/28/2022     6 DOS: the patient was seen and examined on 02/04/2022   Brief hospital course: 81 y.o. male with medical history significant of stage 4 pancreatic cancer, status post colectomy, hydronephrosis status post nephrostomy, hypertension, SBO, CKD 3A presents with ongoing abdominal pain.  Patient states he has had 2 to 3 days of increased abdominal pain.  Pain 8 out of 10 today.  He is concerned for issues with his known small bowel obstruction as he has a history of the same.  He states he does have some output in his ostomy still.  Has had some nausea and vomiting. Pt was found to have SBO. General Surgery consulted  Assessment and Plan:  Small bowel obstruction, recurrent > Patient presented with abdominal pain  >-Pt to undergo diagnostic laparoscopy with lysis of adhesions 2/14 -Now on TPN for nutritional support -NG suction -will follow general surgery recommendation   Leukocytosis -UA suggestive of UTI, -urine cx pos for pan-sensitive ecoli -treated with 7 days of cefazolin for presumed UTI, not sure if active infection versus colonization in the setting of nephrostomy tube -But patient has persistent leukocytosis, WBC worsening, case discussed with general surgery Dr. [name] who recommended Zosyn for now to cover possible intra-abdominal infection though on exam not consistent with peritonitis  Stage 4 Pancreatic cancer > Known metastatic pancreatic cancer.  Follows with oncology, Dr. Marin Olp. - Appreciate input by Dr. Marin Olp, palliative care consulted with Dr. Marin Olp permission  Hydronephrosis from tumor compression > Status post left nephrostomy, last changed on 1/30 -Case discussed with urology who recommended renal ultrasound on 2/17 which showed chronic right hydronephrosis with atrophic right kidney, no hydronephrosis on left  CKD 3A > cr fluctuating, monitor Renal dosing  meds  Hypertension - Not currently any medication for this -BP stable and controlled at this time       Subjective: POD#3, on ng suction, on tpn, no colostomy output , better this morning, he is sitting up in chair, still reports pain, getting prn  iv analgesics    Physical Exam: Vitals:   02/03/22 2008 02/04/22 0412 02/04/22 0430 02/04/22 1405  BP: 140/81 (!) 133/92  132/84  Pulse: 85 80  79  Resp: 18 16  15   Temp: 98.1 F (36.7 C) 98.2 F (36.8 C)  97.8 F (36.6 C)  TempSrc: Oral Oral  Oral  SpO2: 94% 94%  99%  Weight:   66.9 kg   Height:       General exam: Sitting up in chair, appears more alert today Respiratory system: normal chest rise, clear, no audible wheezing, diminished breath sound on the left Cardiovascular system: regular rhythm, s1-s2 Gastrointestinal system: + colostomy, ab dressing, I did not hear any bowel sounds Central nervous system: No seizures, no tremors Extremities: No cyanosis, no joint deformities Skin: No rashes, no pallor Psychiatry: Affect normal // no auditory hallucinations   Data Reviewed:  Labs reviewed, Cr improved  Family Communication: Pt in room, family not at bedside  Disposition: Status is: Inpatient Requires inpatient because: Severity of illness    Planned Discharge Destination: Home     Author: Florencia Reasons, MD PhD FACP 02/04/2022 6:55 PM  For on call review [email or web address].

## 2022-02-04 NOTE — Progress Notes (Signed)
Kevin Villegas is having a slow recovery.  He still has the NG tube in.  His abdomen is still a little distended.  I really do not hear much in the way of bowel sounds.  I realize that surgery was only a couple days ago.  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  This may be why the CEA level is elevated and not the CA 19-9.  He is on TPN.  He is a little bit disoriented this afternoon.  He was not sure what room he was in.  I reassured him that he was in the same room that he came in on.  He is still having some abdominal pain.  I am sure this is no surprise.  He has had no fever.  His labs show white cell count 23,000.  Hemoglobin 10.8.  Platelet count 476,000.  His BUN is 40 creatinine 1.3.  He is on Lovenox.  I think that he is at significant risk for thromboembolic disease.  His vital signs show temperature of 97.8.  Pulse 79.  Blood pressure 132/84.  Abdomen is somewhat distended.  Bowel sounds are really not present.  He has little bit of tenderness to palpation.  There is no fluid wave.  Lungs are clear.  Cardiac exam regular rate and rhythm.  I am glad that he had the surgery.  Again we now know a lot more than we did before surgery.  He has more extensive disease and the scans really have shown.  Hopefully, we will be able to treat him systemically.  He does look somewhat frail right now.  I would like to hope that he will bounce back.  I know that he is getting incredibly good care from all the staff up on 6 E.  Lattie Haw, MD  Psalms 28:7

## 2022-02-04 NOTE — Progress Notes (Signed)
Progress Note  3 Days Post-Op  Subjective: Alert and oriented and remembers our discussion yesterday but very drowsy this am and requires frequent encouragement and repeat questioning. He states abdominal pain is a 9/10 and in the RLQ. Pain is similar to yesterday. He denies respiratory symptoms. He has some chronic pain/discomfort at nephrostomy but nothing new. He denies nausea or emesis.  No family bedside this am  Objective: Vital signs in last 24 hours: Temp:  [97.5 F (36.4 C)-98.2 F (36.8 C)] 98.2 F (36.8 C) (02/17 0412) Pulse Rate:  [72-85] 80 (02/17 0412) Resp:  [16-18] 16 (02/17 0412) BP: (101-140)/(73-92) 133/92 (02/17 0412) SpO2:  [94 %-96 %] 94 % (02/17 0412) Weight:  [66.9 kg] 66.9 kg (02/17 0430) Last BM Date : 01/29/22  Intake/Output from previous day: 02/16 0701 - 02/17 0700 In: 1171.2 [I.V.:849.1; IV Piggyback:322.1] Out: 1500 [Urine:1500] Intake/Output this shift: Total I/O In: 10 [I.V.:10] Out: 335 [Urine:215; Emesis/NG output:120]  PE: General: drowsy, chronically ill appearing, male who is laying in bed in NAD HEENT: head is normocephalic, atraumatic. Mouth is pink and moist Heart: Palpable radial pulses bilaterally Lungs: Respiratory effort nonlabored Abd: soft, ND, hypoactive BS, moderate TTP greatest in RLQ with voluntary guarding. dressings removed - incisions intact with scant old blood, no erythema, induration, or purulent drainage. No air or stool in colostomy. NGT with bilious output - flushed and functioning well MSK: all 4 extremities are symmetrical with no cyanosis, clubbing, or edema. No calf edema or TTP bilaterally Skin: warm and dry Psych: A&Ox3 with an appropriate affect.    Lab Results:  Recent Labs    02/03/22 0523 02/04/22 0531  WBC 20.9* 23.0*  HGB 10.5* 10.8*  HCT 32.2* 34.0*  PLT 378 476*    BMET Recent Labs    02/03/22 0523 02/04/22 0531  NA 132* 133*  K 4.2 4.0  CL 102 102  CO2 24 25  GLUCOSE 142* 159*   BUN 47* 40*  CREATININE 1.50* 1.31*  CALCIUM 8.4* 8.6*    PT/INR No results for input(s): LABPROT, INR in the last 72 hours. CMP     Component Value Date/Time   NA 133 (L) 02/04/2022 0531   NA 139 12/31/2021 1127   K 4.0 02/04/2022 0531   CL 102 02/04/2022 0531   CO2 25 02/04/2022 0531   GLUCOSE 159 (H) 02/04/2022 0531   BUN 40 (H) 02/04/2022 0531   BUN 38 (H) 12/31/2021 1127   CREATININE 1.31 (H) 02/04/2022 0531   CREATININE 1.58 (H) 11/18/2021 1149   CALCIUM 8.6 (L) 02/04/2022 0531   PROT 5.3 (L) 02/03/2022 0523   PROT 7.2 12/31/2021 1127   ALBUMIN 1.8 (L) 02/03/2022 0523   ALBUMIN 3.9 12/31/2021 1127   AST 22 02/03/2022 0523   AST 44 (H) 11/18/2021 1149   ALT 11 02/03/2022 0523   ALT 45 (H) 11/18/2021 1149   ALKPHOS 56 02/03/2022 0523   BILITOT 0.2 (L) 02/03/2022 0523   BILITOT 0.3 12/31/2021 1127   BILITOT 0.3 11/18/2021 1149   GFRNONAA 55 (L) 02/04/2022 0531   GFRNONAA 44 (L) 11/18/2021 1149   GFRAA >60 11/24/2019 1600   Lipase     Component Value Date/Time   LIPASE 42 01/28/2022 1500       Studies/Results: DG Abd 1 View  Result Date: 02/02/2022 CLINICAL DATA:  Abdominal pain EXAM: ABDOMEN - 1 VIEW COMPARISON:  CT abdomen and pelvis 01/28/2022 FINDINGS: Abnormally distended gas-filled loops of small bowel in the mid abdomen measuring  up to 6.7 cm in diameter in the supine position. Enteric tube tip is in the region of the stomach. Left-sided percutaneous nephrostomy tube and ureteral stent. Cholecystectomy clips. Vascular stent graft in the left mid abdomen. IMPRESSION: Abnormal bowel gas pattern suggesting small bowel obstruction or regional ileus. Electronically Signed   By: Ofilia Neas M.D.   On: 02/02/2022 11:08    Anti-infectives: Anti-infectives (From admission, onward)    Start     Dose/Rate Route Frequency Ordered Stop   02/01/22 2200  ceFAZolin (ANCEF) IVPB 1 g/50 mL premix        1 g 100 mL/hr over 30 Minutes Intravenous Every 8 hours  02/01/22 1504     02/01/22 0930  cefoTEtan (CEFOTAN) 2 g in sodium chloride 0.9 % 100 mL IVPB        2 g 200 mL/hr over 30 Minutes Intravenous On call to O.R. 01/31/22 1908 02/01/22 1533   01/30/22 1000  cefTRIAXone (ROCEPHIN) 1 g in sodium chloride 0.9 % 100 mL IVPB  Status:  Discontinued        1 g 200 mL/hr over 30 Minutes Intravenous Every 24 hours 01/29/22 0013 02/01/22 1504   01/28/22 1830  cefTRIAXone (ROCEPHIN) 1 g in sodium chloride 0.9 % 100 mL IVPB        1 g 200 mL/hr over 30 Minutes Intravenous  Once 01/28/22 1828 01/28/22 1927        Assessment/Plan Stage IV pancreatic cancer with psbo  - POD3 s/p Lap LOA, Small bowel resection, excisional biopsy of mesentery mass by Dr. [name] 2/14 - operative findings of oligometastatic disease acting as a transition point in LLQ and concern for serosal 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 thickened nodularity in right abdominal wall x 2, around abdominal wall of the colostomy, left colon colostomy mesentery, rectal stump and left retroperitoneum - suspicious for persistent focuses of carcinomatosis/metastatic disease. 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 Continue to monitor abd exam and WBC. CT scan in next few days if abdominal pain and WBC do not improve - abd xray 2/15 with abnormal gas pattern - NGT with 250 ml in cannister - flushed by me this am and functioning well. No gas or stool in ostomy. Continue LIWS and AROBF - continue TPN - PT/OT - recc snf with possible progression to [place]  FEN: NPO/NGT LIWS, TPN ID: cefotetan periop, ancef (UTI) VTE: lovenox  E coli UTI CKD 3A HTN   LOS: 6 days   Kevin Villegas, [facility] Surgery 02/04/2022, 10:07 AM Please see Amion for pager number during day hours 7:00am-4:30pm

## 2022-02-04 NOTE — Consult Note (Addendum)
Urology Consult Note   Requesting Attending Physician:  Florencia Reasons, MD Service Providing Consult: Urology  Consulting Attending: Deryk Barban, MD   Reason for Consult: Leukocytosis in the presence of left nephrostomy tube and ureteral stent  HPI: Kevin Villegas is seen in consultation for reasons noted above at the request of Florencia Reasons, MD for evaluation of leukocytosis.  This is a 81 y.o. male with metastatic pancreatic cancer which was diagnosed after the patient presented in renal failure.  This was back in November 2022.  Dr. Alinda Money took him to the operating room where he was found to have a right ureteral stricture beyond which wire would not pass.  He also had a left distal ureteral stricture which they were able to stent.  He then later underwent stent failure and had bilateral nephrostomy tubes placed.  The right nephrostomy tube did not put out significant amount of urine and was subsequently removed by interventional radiology.  Dr. Louis Meckel took the patient to the operating room on number 18th 2022 and performed resection of the distal right ureter as well as left ureteral biopsies which confirmed diagnosis of metastatic pancreatic cancer to the bladder.  Since then he has been managed with a left nephrostomy tube.  This was most recently exchanged on 12/23/2021.  Dr. Carlton Adam plan was to return to clinic on 12/17/2021 with a renal ultrasound at that time but the patient did not show up.  He is currently admitted for a small bowel obstruction and proceeded to the operating room on 02/01/2022 with Dr. [name] and underwent lysis of adhesions, small bowel resection, biopsy of mesenteric mass.  He is found to have serosal nodules suspicious for carcinomatosis of the mid small bowel only.  He also was found to have a couple nodules of his abdominal wall which were also suspicious for metastasis.  We are consulted for persistent uptrending leukocytosis.  His creatinine is stable at 1.3.  His  white blood cell count is 23 and has up trended since 12 on presentation on 01/28/2022.  Urine culture from time of admission collected presumably from his nephrostomy tube (as patient does not make urine) grew pansensitive E. coli.  A recent repeat renal ultrasound revealed stable moderate right hydronephrosis with an atrophic right kidney.  No hydronephrosis of the left kidney.  He does have a retained ureteral stent from late November 2022 on the left side.   Past Medical History: Past Medical History:  Diagnosis Date   Colostomy present ([place])    Large bowel obstruction ([place])    Pancreatic cancer [facility])     Past Surgical History:  Past Surgical History:  Procedure Laterality Date   ABDOMINAL SURGERY     BIOPSY  05/20/2021   Procedure: BIOPSY;  Surgeon: Otis Brace, MD;  [facility];  Service: Gastroenterology;;   BOWEL RESECTION     BOWEL RESECTION     CHOLECYSTECTOMY N/A 12/16/2014   Procedure: LAPAROSCOPIC CHOLECYSTECTOMY WITH INTRAOPERATIVE CHOLANGIOGRAM;  Surgeon: Georganna Skeans, MD;  Location: [facility];  Service: General;  Laterality: N/A;   COLONIC STENT PLACEMENT N/A 05/26/2021   Procedure: COLONIC STENT PLACEMENT;  Surgeon: Clarene Essex, MD;  [facility];  Service: Endoscopy;  Laterality: N/A;   CYSTOSCOPY W/ URETERAL STENT PLACEMENT Left 10/27/2021   Procedure: CYSTOSCOPY WITH RETROGRADE PYELOGRAM/URETERAL STENT PLACEMENT;  Surgeon: Raynelle Bring, MD;  [facility];  Service: Urology;  Laterality: Left;   ESOPHAGOGASTRODUODENOSCOPY N/A 05/20/2021   Procedure: ESOPHAGOGASTRODUODENOSCOPY (EGD);  Surgeon: Otis Brace, MD;  [facility];  Service: Gastroenterology;  Laterality: N/A;   EUS N/A 09/24/2015   Procedure: UPPER ENDOSCOPIC ULTRASOUND (EUS) LINEAR;  Surgeon: Milus Banister, MD;  [facility];  Service: Endoscopy;  Laterality: N/A;   FLEXIBLE SIGMOIDOSCOPY N/A 05/20/2021   Procedure: FLEXIBLE SIGMOIDOSCOPY;  Surgeon:  Otis Brace, MD;  [facility];  Service: Gastroenterology;  Laterality: N/A;   FLEXIBLE SIGMOIDOSCOPY N/A 05/26/2021   Procedure: FLEXIBLE SIGMOIDOSCOPY;  Surgeon: Clarene Essex, MD;  [facility];  Service: Endoscopy;  Laterality: N/A;   IR NEPHROSTOMY EXCHANGE LEFT  12/23/2021   IR NEPHROSTOMY EXCHANGE LEFT  01/17/2022   IR NEPHROSTOMY PLACEMENT LEFT  11/11/2021   IR NEPHROSTOMY PLACEMENT RIGHT  11/11/2021   LAPAROSCOPIC DISTAL PANCREATECTOMY  10/2017   [facility]   LAPAROSCOPIC LYSIS OF ADHESIONS N/A 02/01/2022   Procedure: LAPAROSCOPIC LYSIS OF ADHESIONS;  Surgeon: Michael Boston, MD;  [facility];  Service: General;  Laterality: N/A;   LAPAROSCOPIC PARTIAL GASTRECTOMY  10/2017   [facility]   LAPAROSCOPIC SIGMOID COLECTOMY N/A 06/14/2021   Procedure: LAPAROSCOPY WITH MOBILIZATION, PROXIMAL DIVERTING COLOSTOMY, CREATION OF DISTAL MUCOUS FISTULA;  Surgeon: Johnathan Hausen, MD;  [facility];  Service: General;  Laterality: N/A;   LAPAROSCOPIC SMALL BOWEL RESECTION N/A 02/01/2022   Procedure: SMALL BOWEL RESECTION;  Surgeon: Michael Boston, MD;  [facility];  Service: General;  Laterality: N/A;   LAPAROSCOPIC SPLENECTOMY  10/2017   [facility]   VASECTOMY      Medication: Current Facility-Administered Medications  Medication Dose Route Frequency Provider Last Rate Last Admin   0.9 %  sodium chloride infusion   Intravenous PRN Michael Boston, MD   Stopped at 01/28/22 2301   acetaminophen (OFIRMEV) IV 1,000 mg  1,000 mg Intravenous Q6H Winferd Humphrey, PA-C   Stopped at 02/04/22 1557   alum & mag hydroxide-simeth (MAALOX/MYLANTA) 200-200-20 MG/5ML suspension 30 mL  30 mL Oral Q6H PRN Michael Boston, MD       antiseptic oral rinse (BIOTENE) solution 15 mL  15 mL Mouth Rinse PRN Florencia Reasons, MD       Chlorhexidine Gluconate Cloth 2 % PADS 6 each  6 each Topical Daily Michael Boston, MD   6 each at 02/04/22 0918   diphenhydrAMINE (BENADRYL) injection 12.5-25 mg  12.5-25 mg  Intravenous Q6H PRN Michael Boston, MD       enoxaparin (LOVENOX) injection 40 mg  40 mg Subcutaneous Q24H Donne Hazel, MD   40 mg at 02/04/22 0918   fentaNYL (SUBLIMAZE) injection 25-50 mcg  25-50 mcg Intravenous Q1H PRN Michael Boston, MD       insulin aspart (novoLOG) injection 0-9 Units  0-9 Units Subcutaneous Q6H Shade, Christine E, RPH   1 Units at 02/04/22 1720   lactated ringers bolus 1,000 mL  1,000 mL Intravenous Q8H PRN Michael Boston, MD       lactated ringers bolus 1,000 mL  1,000 mL Intravenous Q8H PRN Gross, Remo Lipps, MD       lidocaine (LIDODERM) 5 % 1 patch  1 patch Transdermal Q24H Winferd Humphrey, PA-C   1 patch at 02/04/22 1158   lip balm (CARMEX) ointment 1 application  1 application Topical BID Michael Boston, MD   1 application at 26/20/35 5974   magic mouthwash  15 mL Oral TID Michael Boston, MD   15 mL at 02/04/22 1523   menthol-cetylpyridinium (CEPACOL) lozenge 3 mg  1 lozenge Oral PRN Michael Boston, MD   3 mg at 02/03/22 1435  methocarbamol (ROBAXIN) 1,000 mg in dextrose 5 % 100 mL IVPB  1,000 mg Intravenous Tor Netters, MD   Stopped at 02/04/22 1520   phenol (CHLORASEPTIC) mouth spray 2 spray  2 spray Mouth/Throat PRN Michael Boston, MD       piperacillin-tazobactam (ZOSYN) IVPB 3.375 g  3.375 g Intravenous Tor Netters, MD 12.5 mL/hr at 02/04/22 1834 Infusion Verify at 02/04/22 1834   prochlorperazine (COMPAZINE) injection 5-10 mg  5-10 mg Intravenous Q4H PRN Michael Boston, MD       simethicone (MYLICON) 40 RA/0.7MA suspension 80 mg  80 mg Oral QID PRN Michael Boston, MD       sodium chloride flush (NS) 0.9 % injection 10-40 mL  10-40 mL Intracatheter PRN Michael Boston, MD       sodium chloride flush (NS) 0.9 % injection 3 mL  3 mL Intravenous Gorden Harms, MD   3 mL at 02/04/22 0918   TPN ADULT (ION)   Intravenous Continuous TPN Randa Spike, RPH 85 mL/hr at 02/04/22 1834 Infusion Verify at 02/04/22 1834    Allergies: Allergies  Allergen  Reactions   Pollen Extract Other (See Comments)    Sinus congestion    Social History: Social History   Tobacco Use   Smoking status: Never   Smokeless tobacco: Never  Vaping Use   Vaping Use: Never used  Substance Use Topics   Alcohol use: No    Alcohol/week: 0.0 standard drinks   Drug use: No    Family History Family History  Problem Relation Age of Onset   Stroke Mother     Review of Systems 10 systems were reviewed and are negative except as noted specifically in the HPI.  Objective   Vital signs in last 24 hours: BP 132/84 (BP Location: Left Arm)    Pulse 79    Temp 97.8 F (36.6 C) (Oral)    Resp 15    Ht 5\' 11"  (1.803 m)    Wt 66.9 kg    SpO2 99%    BMI 20.57 kg/m   Physical Exam General: NAD, A&O, resting, appropriate HEENT: [place]/AT, EOMI, MMM Pulmonary: Normal work of breathing Cardiovascular: HDS, adequate peripheral perfusion Abdomen: Soft, NTTP.  Colostomy without output GU: No Foley catheter in place.  Left nephrostomy tube with clear yellow urine output. Extremities: warm and well perfused Neuro: Appropriate, no focal neurological deficits  Most Recent Labs: Lab Results  Component Value Date   WBC 23.0 (H) 02/04/2022   HGB 10.8 (L) 02/04/2022   HCT 34.0 (L) 02/04/2022   PLT 476 (H) 02/04/2022    Lab Results  Component Value Date   NA 133 (L) 02/04/2022   K 4.0 02/04/2022   CL 102 02/04/2022   CO2 25 02/04/2022   BUN 40 (H) 02/04/2022   CREATININE 1.31 (H) 02/04/2022   CALCIUM 8.6 (L) 02/04/2022   MG 1.9 02/04/2022   PHOS 3.3 02/04/2022    Lab Results  Component Value Date   INR 1.2 11/11/2021   APTT 26 06/11/2021     Urine Culture: @[id number] (laburin,org,[id number])@   IMAGING: US RENAL  Result Date: 02/04/2022 CLINICAL DATA:  Hydronephrosis EXAM: RENAL / URINARY TRACT ULTRASOUND COMPLETE COMPARISON:  CT 01/28/2022 FINDINGS: Right Kidney: Renal measurements: 9.4 x 4.3 x 4.9 cm = volume: 103.7 mL. Atrophic right kidney with  moderate right-sided hydronephrosis. Upper pole exophytic cyst measuring up to 2.7 cm. Left Kidney: Renal measurements: 11.3 x 6.4 x 5.8 cm = volume: 218.8 mL. No  hydronephrosis. Bladder: Not visualized Other: None. IMPRESSION: Atrophic right kidney with chronic moderate right hydronephrosis. No left-sided hydronephrosis. Electronically Signed   By: Maurine Simmering M.D.   On: 02/04/2022 18:26   DG CHEST PORT 1 VIEW  Result Date: 02/04/2022 CLINICAL DATA:  Cough.  History of pancreatic cancer. EXAM: PORTABLE CHEST 1 VIEW COMPARISON:  January 07, 2022. FINDINGS: Stable cardiomediastinal silhouette. Nasogastric tube is seen entering stomach. Right internal jugular Port-A-Cath is unchanged. Bilateral pulmonary nodules are noted. Stable probable loculated left pleural effusion is noted. Bony thorax is unremarkable. IMPRESSION: Stable bilateral pulmonary nodules consistent with metastatic disease. Stable loculated left pleural effusion. Electronically Signed   By: Marijo Conception M.D.   On: 02/04/2022 11:24    ------  Assessment:  81 y.o. male with history of metastatic pancreatic cancer, hydronephrosis likely due to malignant obstruction with an atrophic chronically dilated right kidney and left kidney with nephrostomy tube in place as well as a ureteral stent since Dr. Carlton Adam ureteroscopy in late November 2022.  Patient no showed for an appointment on 12/17/2021.  Presented with small bowel obstruction underwent small bowel resection with general surgery on 02/01/2021.  Continues to have uptrending leukocytosis to 23.  We are consulted to weigh in on possible UTI.  His creatinine is stable.  Patient is on Zosyn starting today for both UTI as well as intra-abdominal coverage.  He is afebrile hemodynamically stable.  Recommend continuing to monitor his white blood cell count.  His left ureteral stent does need to be removed at some point but this is not urgent.  We will arrange for close follow-up for the  patient once he is nearing discharge.  Recommendations: -No acute urologic intervention indicated - We will arrange for close outpatient follow-up for removal of left ureteral stent once patient is nearing discharge.  This has been requested.  If patient's hospitalization is prolonged, please page [place] as patient nears discharge to ensure this follow-up is set up.  Patient still does not have return of bowel function and is frail/on parenteral nutrition status post his exploratory laparotomy.   Thank you for this consult. Please contact the urology consult pager with any further questions/concerns. I performed a history and physical examination of the patient and discussed the patients management with the resident.  I reviewed the resident's note and agree with the documented findings and plan of care.

## 2022-02-04 NOTE — Progress Notes (Signed)
Patient continues to be hospitalized. Will follow for post discharge needs and follow up.  Oncology Nurse Navigator Documentation  Oncology Nurse Navigator Flowsheets 02/04/2022  Navigator Follow Up Date: 02/11/2022  Navigator Follow Up Reason: Appointment Review  Navigator Location [facility]  Navigator Encounter Type Appt/Treatment Plan Review  Telephone -  Patient Visit Type MedOnc  Treatment Phase Active Tx  Barriers/Navigation Needs Coordination of Care;Education  Education -  Interventions None Required  Acuity Level 2-Minimal Needs (1-2 Barriers Identified)  Coordination of Care -  Education Method -  Support Groups/Services Friends and Family  Time Spent with Patient 15

## 2022-02-04 NOTE — Progress Notes (Signed)
Physical Therapy Treatment Patient Details Name: Kevin Villegas MRN: [id number] DOB: May 16, 1941 Today's Date: 02/04/2022   History of Present Illness Patient is a 81 year old male who presented on 01/28/22 with 2-3 day history of abdominal pain. patient was found to have SBO. patient underwent diagnostic laparoscopy with lysis of adhesions 2/14. PMH:HTN, stage 4 pancreatic cancer,CKD III, status post colectomy, hydronephrosis status post nephrostomy,    PT Comments    Pt requires min A+2 for safety with powering to stand and limited steps in room using RW. Pt c/o abdominal pain and soreness with mobility, educated on exhaling with mobility and sequencing to improve  performance. Pt tolerates remaining up in recliner, but reports too fatigued and sore to perform seated exercises or additional ambulation. RN notified of pt's cognition and going into pt's room at [facility].   Recommendations for follow up therapy are one component of a multi-disciplinary discharge planning process, led by the attending physician.  Recommendations may be updated based on patient status, additional functional criteria and insurance authorization.  Follow Up Recommendations  Skilled nursing-short term rehab (<3 hours/day)     Assistance Recommended at Discharge Frequent or constant Supervision/Assistance  Patient can return home with the following A lot of help with walking and/or transfers;A lot of help with bathing/dressing/bathroom;Assistance with cooking/housework;Assist for transportation   Equipment Recommendations  Rolling walker (2 wheels)    Recommendations for Other Services       Precautions / Restrictions Precautions Precautions: Fall Precaution Comments: abdominal wounds, ostomy, drain, NG Restrictions Weight Bearing Restrictions: No     Mobility  Bed Mobility Overal bed mobility: Needs Assistance Bed Mobility: Supine to Sit  Sidelying to sit: Min assist, HOB elevated  General bed mobility  comments: cued for log rolling, able to mobilize BLE to EOB, min A to lift trunk up into sitting, increased time due to pain    Transfers Overall transfer level: Needs assistance Equipment used: Rolling walker (2 wheels) Transfers: Sit to/from Stand Sit to Stand: Min assist, +2 safety/equipment  General transfer comment: min A +2 for safety with multiple lines and cues for hand placement, requires some momentum and VC to avoid breath holding    Ambulation/Gait Ambulation/Gait assistance: Min assist, +2 safety/equipment Gait Distance (Feet): 10 Feet Assistive device: Rolling walker (2 wheels) Gait Pattern/deviations: Step-to pattern, Decreased stride length Gait velocity: decreased  General Gait Details: pt ambulates in room with RW, step to pattern with trunk slightly flexed forward, limited by pain, able to clear each foot from floor with steps   Stairs             Wheelchair Mobility    Modified Rankin (Stroke Patients Only)       Balance Overall balance assessment: Needs assistance Sitting-balance support: Feet supported Sitting balance-Leahy Scale: Fair Sitting balance - Comments: seated in midline, no lateral lean noted in previous sessions  Standing balance support: During functional activity, Bilateral upper extremity supported, Reliant on assistive device for balance Standing balance-Leahy Scale: Poor     Cognition Arousal/Alertness: Awake/alert Behavior During Therapy: Flat affect Overall Cognitive Status: No family/caregiver present to determine baseline cognitive functioning  General Comments: Pt awake, states name appropriately and aware he is not at home, pt follows commands with slightly increased time, asks for Butch Penny to come out of bathroom, educated pt that she is not here currently and oriented pt to being in hospital.        Exercises      General Comments  Pertinent Vitals/Pain Pain Assessment Pain Assessment: Faces Faces Pain  Scale: Hurts little more Pain Location: abdomen Pain Descriptors / Indicators: Discomfort, Grimacing, Guarding, Sore Pain Intervention(s): Limited activity within patient's tolerance, Monitored during session, Repositioned    Home Living                          Prior Function            PT Goals (current goals can now be found in the care plan section) Acute Rehab PT Goals Patient Stated Goal: decrease pain PT Goal Formulation: With patient Time For Goal Achievement: 02/16/22 Potential to Achieve Goals: Good Progress towards PT goals: Progressing toward goals    Frequency    Min 3X/week      PT Plan Current plan remains appropriate    Co-evaluation              AM-PAC PT "6 Clicks" Mobility   Outcome Measure  Help needed turning from your back to your side while in a flat bed without using bedrails?: A Little Help needed moving from lying on your back to sitting on the side of a flat bed without using bedrails?: A Little Help needed moving to and from a bed to a chair (including a wheelchair)?: A Lot Help needed standing up from a chair using your arms (e.g., wheelchair or bedside chair)?: A Lot Help needed to walk in hospital room?: Total Help needed climbing 3-5 steps with a railing? : Total 6 Click Score: 12    End of Session Equipment Utilized During Treatment: Gait belt Activity Tolerance: Patient limited by lethargy Patient left: in chair;with call bell/phone within reach (RN going into room to check on pt) Nurse Communication: Mobility status PT Visit Diagnosis: Other abnormalities of gait and mobility (R26.89);Muscle weakness (generalized) (M62.81)     Time: 6803-2122 PT Time Calculation (min) (ACUTE ONLY): 13 min  Charges:  $Therapeutic Activity: 8-22 mins                      Tori Kenzlie Disch PT, DPT 02/04/22, 1:24 PM

## 2022-02-04 NOTE — Progress Notes (Signed)
PHARMACY - TOTAL PARENTERAL NUTRITION CONSULT NOTE   Indication: Small bowel obstruction and inability to meet nutritional needs w/ PO intake  Patient Measurements: Height: _0  (180.3 cm) Weight: 66.9 kg (147 lb 7.8 oz) IBW/kg (Calculated) : 75.3 TPN AdjBW (KG): 57.7 Body mass index is 20.57 kg/m. Usual Weight: Weight noted in November 2022 to be 63.5 kg  Assessment: 81 yo male presenting to [facility] ED for abdominal pain, nausea, vomiting. PMH includes pancreatic cancer with evidence of metastatic disease to his bladder, lungs, and pleural space and history of SBO.  S/p stent placement between stomach and intestines and colostomy placement in June 2022. Pharmacy is consulted to begin TPN for possible bridge to surgery given low pre-albumin and inability to meet nutritional needs w/ PO intake.   Glucose / Insulin: No hx DM; 6 units sensitive SSI required in past 24 hours. CBGs improved (range 120-155).  - Dexamethasone given in OR 2/14  Electrolytes: Na low/slightly increased to 133. Others, including Corrected Calcium, Mag 1.9, K 4.0, WNL. Renal: SCr elevated but improved to 1.3, BUN elevated but improved to 40 Hepatic: AST/ALT, Alk Phos WNL. Tbili low. Pre-albumin low at 8.2 (2/11); albumin low at 1.8. TG 115 (2/13) Intake / Output; MIVF: I/O net -321m; UOP 1500 mL, No NG output documented. Intake IV charting does not account for full TPN infused.   - No additional IVF GI Imaging: - 2/10 CT abdomen pelvis: re-demonstration of high-grade partial SBO with transition point in the upper left pelvis psych likely due to adhesion.  GI Surgeries / Procedures:  - 2/14: laparoscopic lysis of adhesions, small bowel resection, excisional bipsy of mesentery mass   Central access: PAC TPN start date: 2/12  Nutritional Goals: Goal TPN rate is 85 mL/hr (provides 112g of protein and 2101 kcals per day)  RD Assessment:  Estimated Needs Total Energy Estimated Needs: 2050-2250 kcal Total Protein  Estimated Needs: 105-120 grams Total Fluid Estimated Needs: >/= 2 L/day  Current Nutrition:  NPO except ice chips  TPN  Plan:  Continue TPN at goal rate of 842mhr at 1800 Electrolytes in TPN:  Na 1254mL (increased)  K 79m26m  Ca 2mEq39mMg 79mEq23mincreased) Phos 15mmol17ml:Ac 1:2 Add standard trace elements daily to TPN. Add MVI to TPN on M/W/F only due to national shortage.  Sensitive SSI q6h and adjust as needed. IVF per MD (none ordered at this time) Monitor TPN labs on Mon/Thurs BMET, Mg in AM   ChristiGretta Arab, BCPS [facility] [phone] 7:24 AM

## 2022-02-05 ENCOUNTER — Inpatient Hospital Stay (HOSPITAL_COMMUNITY): Payer: Medicare Other

## 2022-02-05 DIAGNOSIS — K56609 Unspecified intestinal obstruction, unspecified as to partial versus complete obstruction: Secondary | ICD-10-CM | POA: Diagnosis not present

## 2022-02-05 LAB — CBC
HCT: 34.1 % — ABNORMAL LOW (ref 39.0–52.0)
Hemoglobin: 10.4 g/dL — ABNORMAL LOW (ref 13.0–17.0)
MCH: 30.6 pg (ref 26.0–34.0)
MCHC: 30.5 g/dL (ref 30.0–36.0)
MCV: 100.3 fL — ABNORMAL HIGH (ref 80.0–100.0)
Platelets: 449 10*3/uL — ABNORMAL HIGH (ref 150–400)
RBC: 3.4 MIL/uL — ABNORMAL LOW (ref 4.22–5.81)
RDW: 17.9 % — ABNORMAL HIGH (ref 11.5–15.5)
WBC: 22.8 10*3/uL — ABNORMAL HIGH (ref 4.0–10.5)
nRBC: 0.3 % — ABNORMAL HIGH (ref 0.0–0.2)

## 2022-02-05 LAB — BASIC METABOLIC PANEL
Anion gap: 8 (ref 5–15)
BUN: 40 mg/dL — ABNORMAL HIGH (ref 8–23)
CO2: 24 mmol/L (ref 22–32)
Calcium: 8.5 mg/dL — ABNORMAL LOW (ref 8.9–10.3)
Chloride: 103 mmol/L (ref 98–111)
Creatinine, Ser: 1.21 mg/dL (ref 0.61–1.24)
GFR, Estimated: >60 mL/min (ref >60)
Glucose, Bld: 144 mg/dL — ABNORMAL HIGH (ref 70–99)
Potassium: 3.3 mmol/L — ABNORMAL LOW (ref 3.5–5.1)
Sodium: 135 mmol/L (ref 135–145)

## 2022-02-05 LAB — GLUCOSE, CAPILLARY
Glucose-Capillary: 154 mg/dL — ABNORMAL HIGH (ref 70–99)
Glucose-Capillary: 139 mg/dL — ABNORMAL HIGH (ref 70–99)
Glucose-Capillary: 127 mg/dL — ABNORMAL HIGH (ref 70–99)
Glucose-Capillary: 133 mg/dL — ABNORMAL HIGH (ref 70–99)

## 2022-02-05 LAB — MAGNESIUM: Magnesium: 2 mg/dL (ref 1.7–2.4)

## 2022-02-05 MED ORDER — POTASSIUM CHLORIDE 10 MEQ/50ML IV SOLN
10.0000 meq | INTRAVENOUS | Status: AC
  Administered 2022-02-05 (×4): 10 meq via INTRAVENOUS
  Filled 2022-02-05 (×4): qty 50

## 2022-02-05 MED ORDER — TRAVASOL 10 % IV SOLN
INTRAVENOUS | Status: AC
  Filled 2022-02-05: qty 1122

## 2022-02-05 NOTE — Progress Notes (Addendum)
PHARMACY - TOTAL PARENTERAL NUTRITION CONSULT NOTE   Indication: Small bowel obstruction and inability to meet nutritional needs w/ PO intake  Patient Measurements: Height: 5' 11"  (180.3 cm) Weight: 68.7 kg (151 lb 7.3 oz) IBW/kg (Calculated) : 75.3 TPN AdjBW (KG): 57.7 Body mass index is 21.12 kg/m. Usual Weight: Weight noted in November 2022 to be 63.5 kg  Assessment: 81 yo Villegas presenting to [facility] ED for abdominal pain, nausea, vomiting. PMH includes pancreatic cancer with evidence of metastatic disease to his bladder, lungs, and pleural space and history of SBO.  S/p stent placement between stomach and intestines and colostomy placement in June 2022. Pharmacy is consulted to begin TPN for possible bridge to surgery given low pre-albumin and inability to meet nutritional needs w/ PO intake.   Glucose / Insulin: No hx DM; 4 units sensitive SSI required in past 24 hours. CBGs improved (range 120-160).  - Dexamethasone given in OR 2/14  Electrolytes: Na up 135, now WNL. Potassium low at 3.3.  Others, including Corrected Calcium, Mag WNL. Renal: SCr elevated but improved to 1.21, BUN elevated but stable at 40 Hepatic: AST/ALT, Alk Phos WNL. Tbili low. Pre-albumin low at 8.2 (2/11); albumin low at 1.8. TG 115 (2/13) Intake / Output; MIVF: I/O net 291.2; UOP 710 mL. Intake IV charting does not account for full TPN infused.   - No additional IVF GI Imaging: - 2/10 CT abdomen pelvis: re-demonstration of high-grade partial SBO with transition point in the upper left pelvis psych likely due to adhesion.  GI Surgeries / Procedures:  - 2/14: laparoscopic lysis of adhesions, small bowel resection, excisional bipsy of mesentery mass   Central access: PAC TPN start date: 2/12  Nutritional Goals: Goal TPN rate is 85 mL/hr (provides 112g of protein and 2101 kcals per day)  RD Assessment:  Estimated Needs Total Energy Estimated Needs: 2050-2250 kcal Total Protein Estimated Needs: 105-120  grams Total Fluid Estimated Needs: >/= 2 L/day  Current Nutrition:  NPO except ice chips  TPN  Plan:  BMP ordered; will replace electrolytes separately if needed Continue TPN at goal rate of 87m/hr at 1800 Electrolytes in TPN:  Na 1267m/L K 2091mL Ca 2mE69m Mg 10mE50mPhos 15mmo89mCl:Ac 1:2 Add standard trace elements daily to TPN. Add MVI to TPN on M/W/F only due to national shortage.  Sensitive SSI q6h and adjust as needed. IVF per MD (none ordered at this time) Monitor TPN labs on Mon/Thurs    MelissRoyetta AsalmD, BCPS [place] utilize Amion for appropriate phone number to reach the unit pharmacist ([facility] [place]/2023 7:43 AM

## 2022-02-05 NOTE — Progress Notes (Signed)
4 Days Post-Op   Chief Complaint/Subjective: No bowel movements or flatus, moderate abdominal pain  Objective: Vital signs in last 24 hours: Temp:  [97.8 F (36.6 C)-98.5 F (36.9 C)] 98.5 F (36.9 C) (02/18 0525) Pulse Rate:  [79-81] 80 (02/18 0525) Resp:  [15-16] 16 (02/18 0525) BP: (123-145)/(79-84) 123/80 (02/18 0525) SpO2:  [94 %-100 %] 94 % (02/18 0525) Weight:  [68.7 kg] 68.7 kg (02/18 0525) Last BM Date : 01/29/22 Intake/Output from previous day: 02/17 0701 - 02/18 0700 In: 2647.7 [I.V.:1815.7; IV Piggyback:831.9] Out: 1935 [Urine:1065; Emesis/NG output:570] Intake/Output this shift: No intake/output data recorded.  PE: Gen: NAd Resp: nonlabored Card: RRR Abd: soft, distended, no guarding  Lab Results:  Recent Labs    02/04/22 0531 02/05/22 0822  WBC 23.0* 22.8*  HGB 10.8* 10.4*  HCT 34.0* 34.1*  PLT 476* 449*   BMET Recent Labs    02/03/22 0523 02/04/22 0531  NA 132* 133*  K 4.2 4.0  CL 102 102  CO2 24 25  GLUCOSE 142* 159*  BUN 47* 40*  CREATININE 1.50* 1.31*  CALCIUM 8.4* 8.6*   PT/INR No results for input(s): LABPROT, INR in the last 72 hours. CMP     Component Value Date/Time   NA 133 (L) 02/04/2022 0531   NA 139 12/31/2021 1127   K 4.0 02/04/2022 0531   CL 102 02/04/2022 0531   CO2 25 02/04/2022 0531   GLUCOSE 159 (H) 02/04/2022 0531   BUN 40 (H) 02/04/2022 0531   BUN 38 (H) 12/31/2021 1127   CREATININE 1.31 (H) 02/04/2022 0531   CREATININE 1.58 (H) 11/18/2021 1149   CALCIUM 8.6 (L) 02/04/2022 0531   PROT 5.3 (L) 02/03/2022 0523   PROT 7.2 12/31/2021 1127   ALBUMIN 1.8 (L) 02/03/2022 0523   ALBUMIN 3.9 12/31/2021 1127   AST 22 02/03/2022 0523   AST 44 (H) 11/18/2021 1149   ALT 11 02/03/2022 0523   ALT 45 (H) 11/18/2021 1149   ALKPHOS 56 02/03/2022 0523   BILITOT 0.2 (L) 02/03/2022 0523   BILITOT 0.3 12/31/2021 1127   BILITOT 0.3 11/18/2021 1149   GFRNONAA 55 (L) 02/04/2022 0531   GFRNONAA 44 (L) 11/18/2021 1149    GFRAA >60 11/24/2019 1600   Lipase     Component Value Date/Time   LIPASE 42 01/28/2022 1500    Studies/Results: US RENAL  Result Date: 02/04/2022 CLINICAL DATA:  Hydronephrosis EXAM: RENAL / URINARY TRACT ULTRASOUND COMPLETE COMPARISON:  CT 01/28/2022 FINDINGS: Right Kidney: Renal measurements: 9.4 x 4.3 x 4.9 cm = volume: 103.7 mL. Atrophic right kidney with moderate right-sided hydronephrosis. Upper pole exophytic cyst measuring up to 2.7 cm. Left Kidney: Renal measurements: 11.3 x 6.4 x 5.8 cm = volume: 218.8 mL. No hydronephrosis. Bladder: Not visualized Other: None. IMPRESSION: Atrophic right kidney with chronic moderate right hydronephrosis. No left-sided hydronephrosis. Electronically Signed   By: Maurine Simmering M.D.   On: 02/04/2022 18:26   DG CHEST PORT 1 VIEW  Result Date: 02/04/2022 CLINICAL DATA:  Cough.  History of pancreatic cancer. EXAM: PORTABLE CHEST 1 VIEW COMPARISON:  January 07, 2022. FINDINGS: Stable cardiomediastinal silhouette. Nasogastric tube is seen entering stomach. Right internal jugular Port-A-Cath is unchanged. Bilateral pulmonary nodules are noted. Stable probable loculated left pleural effusion is noted. Bony thorax is unremarkable. IMPRESSION: Stable bilateral pulmonary nodules consistent with metastatic disease. Stable loculated left pleural effusion. Electronically Signed   By: Marijo Conception M.D.   On: 02/04/2022 11:24    Anti-infectives: Anti-infectives (From  admission, onward)    Start     Dose/Rate Route Frequency Ordered Stop   02/04/22 1545  piperacillin-tazobactam (ZOSYN) IVPB 3.375 g        3.375 g 12.5 mL/hr over 240 Minutes Intravenous Every 8 hours 02/04/22 1453 02/09/22 1359   02/01/22 2200  ceFAZolin (ANCEF) IVPB 1 g/50 mL premix  Status:  Discontinued        1 g 100 mL/hr over 30 Minutes Intravenous Every 8 hours 02/01/22 1504 02/04/22 1453   02/01/22 0930  cefoTEtan (CEFOTAN) 2 g in sodium chloride 0.9 % 100 mL IVPB        2 g 200 mL/hr  over 30 Minutes Intravenous On call to O.R. 01/31/22 1908 02/01/22 1533   01/30/22 1000  cefTRIAXone (ROCEPHIN) 1 g in sodium chloride 0.9 % 100 mL IVPB  Status:  Discontinued        1 g 200 mL/hr over 30 Minutes Intravenous Every 24 hours 01/29/22 0013 02/01/22 1504   01/28/22 1830  cefTRIAXone (ROCEPHIN) 1 g in sodium chloride 0.9 % 100 mL IVPB        1 g 200 mL/hr over 30 Minutes Intravenous  Once 01/28/22 1828 01/28/22 1927       Assessment/Plan  s/p Procedure(s): LAPAROSCOPIC LYSIS OF ADHESIONS SMALL BOWEL RESECTION 02/01/2022    FEN - TPN VTE - lovenox ID - [email or web address] Disposition - inpatient for ileus   LOS: 7 days   I reviewed last 24 h vitals and pain scores, last 48 h intake and output, last 24 h labs and trends, and last 24 h imaging results.  This care required high  level of medical decision making.   [facility] 02/05/2022, 9:09 AM Please see Amion for pager number during day hours 7:00am-4:30pm or 7:00am -11:30am on weekends

## 2022-02-05 NOTE — Progress Notes (Signed)
PIV assessed by this RN and Benito Mccreedy RN.  Pt c/o tenderness, no redness noted at site.  Pt watching flush process, loudly complained of discomfort during attempt to get blood return, not during flush.  Flushes easily with 20 ml of NS without evidence of infiltrate or redness.RN notified.  Recommended dressing change to PIV site.

## 2022-02-05 NOTE — Progress Notes (Signed)
Progress Note   Patient: Kevin Villegas KGS:[id number] DOB: 1941-08-07 DOA: 01/28/2022     7 DOS: the patient was seen and examined on 02/05/2022   Brief hospital course: 81 y.o. male with medical history significant of stage 4 pancreatic cancer, status post colectomy, hydronephrosis status post nephrostomy, hypertension, SBO, CKD 3A presents with ongoing abdominal pain.  Patient states he has had 2 to 3 days of increased abdominal pain.  Pain 8 out of 10 today.  He is concerned for issues with his known small bowel obstruction as he has a history of the same.  He states he does have some output in his ostomy still.  Has had some nausea and vomiting. Pt was found to have SBO. General Surgery consulted  Assessment and Plan:  Small bowel obstruction, recurrent > Patient presented with abdominal pain  >-Pt to undergo diagnostic laparoscopy with lysis of adhesions 2/14 -Now on TPN for nutritional support -NG suction -will follow general surgery recommendation   Leukocytosis -UA suggestive of UTI, -urine cx pos for pan-sensitive ecoli -treated with 7 days of cefazolin for presumed UTI, not sure if active infection versus colonization in the setting of nephrostomy tube -But patient has persistent leukocytosis, WBC worsening, case discussed with general surgery Dr. [name] who recommended Zosyn for now to cover possible intra-abdominal infection though on exam not consistent with peritonitis  Stage 4 Pancreatic cancer > Known metastatic pancreatic cancer.  Follows with oncology, Dr. Marin Olp. - Appreciate input by Dr. Marin Olp, palliative care consulted with Dr. Marin Olp permission  Hydronephrosis from tumor compression > Status post left nephrostomy, last changed on 1/30 -Case discussed with urology who recommended renal ultrasound on 2/17 which showed chronic right hydronephrosis with atrophic right kidney, no hydronephrosis on left -Urology recommend outpatient follow-up, please see urology  consult note on 2/18 by Dr. Milford Cage  CKD 3A > cr fluctuating, monitor Renal dosing meds  Hypertension - Not currently any medication for this -BP stable and controlled at this time       Subjective: POD#4, ng dislodged, he initially did not want to placed it back in, now his stomach appears more distended, he agreed to have it put back  He is tolerating tpn,  still reports pain, getting prn  iv analgesics    Physical Exam: Vitals:   02/04/22 1405 02/04/22 2104 02/05/22 0525 02/05/22 1000  BP: 132/84 (!) 145/79 123/80 127/81  Pulse: 79 81 80 83  Resp: 15 16 16    Temp: 97.8 F (36.6 C) 98.2 F (36.8 C) 98.5 F (36.9 C) 98.7 F (37.1 C)  TempSrc: Oral Oral Oral Oral  SpO2: 99% 100% 94% 94%  Weight:   68.7 kg   Height:       General exam: Weak, alert and interactive Respiratory system: normal chest rise, clear, no audible wheezing, diminished breath sound on the left Cardiovascular system: regular rhythm, s1-s2 Gastrointestinal system: + colostomy, no output, ab dressing, I did not hear any bowel sounds, ab appears more distended today, diffuse mild tender, no rebound, +left nephrostomy tube Central nervous system: No seizures, no tremors Extremities: No cyanosis, no joint deformities Skin: No rashes, no pallor Psychiatry: Affect normal // no auditory hallucinations   Data Reviewed:  Labs reviewed, Cr improved  Family Communication: Pt in room, family not at bedside  Disposition: Status is: Inpatient Requires inpatient because: Severity of illness    Planned Discharge Destination: Home     Author: Florencia Reasons, MD PhD FACP 02/05/2022 11:44 AM  For on call  review [email or web address].

## 2022-02-06 ENCOUNTER — Inpatient Hospital Stay (HOSPITAL_COMMUNITY): Payer: Medicare Other

## 2022-02-06 DIAGNOSIS — C252 Malignant neoplasm of tail of pancreas: Secondary | ICD-10-CM | POA: Diagnosis not present

## 2022-02-06 DIAGNOSIS — Z515 Encounter for palliative care: Secondary | ICD-10-CM | POA: Diagnosis not present

## 2022-02-06 DIAGNOSIS — Z7189 Other specified counseling: Secondary | ICD-10-CM | POA: Diagnosis not present

## 2022-02-06 DIAGNOSIS — K56609 Unspecified intestinal obstruction, unspecified as to partial versus complete obstruction: Secondary | ICD-10-CM | POA: Diagnosis not present

## 2022-02-06 LAB — BASIC METABOLIC PANEL
Anion gap: 7 (ref 5–15)
BUN: 45 mg/dL — ABNORMAL HIGH (ref 8–23)
CO2: 25 mmol/L (ref 22–32)
Calcium: 8.4 mg/dL — ABNORMAL LOW (ref 8.9–10.3)
Chloride: 107 mmol/L (ref 98–111)
Creatinine, Ser: 1.28 mg/dL — ABNORMAL HIGH (ref 0.61–1.24)
GFR, Estimated: 57 mL/min — ABNORMAL LOW (ref >60)
Glucose, Bld: 162 mg/dL — ABNORMAL HIGH (ref 70–99)
Potassium: 3.3 mmol/L — ABNORMAL LOW (ref 3.5–5.1)
Sodium: 139 mmol/L (ref 135–145)

## 2022-02-06 LAB — GLUCOSE, CAPILLARY
Glucose-Capillary: 132 mg/dL — ABNORMAL HIGH (ref 70–99)
Glucose-Capillary: 148 mg/dL — ABNORMAL HIGH (ref 70–99)
Glucose-Capillary: 158 mg/dL — ABNORMAL HIGH (ref 70–99)
Glucose-Capillary: 158 mg/dL — ABNORMAL HIGH (ref 70–99)
Glucose-Capillary: 159 mg/dL — ABNORMAL HIGH (ref 70–99)
Glucose-Capillary: 176 mg/dL — ABNORMAL HIGH (ref 70–99)

## 2022-02-06 MED ORDER — POTASSIUM CHLORIDE 10 MEQ/50ML IV SOLN
10.0000 meq | INTRAVENOUS | Status: AC
  Administered 2022-02-06 (×4): 10 meq via INTRAVENOUS
  Filled 2022-02-06 (×4): qty 50

## 2022-02-06 MED ORDER — TRAVASOL 10 % IV SOLN
INTRAVENOUS | Status: AC
  Filled 2022-02-06: qty 1122

## 2022-02-06 NOTE — Consult Note (Signed)
Consultation Note Date: 02/06/2022   Patient Name: Kevin Villegas  DOB: 1941/06/15  MRN: [id number]  Age / Sex: 81 y.o., male  PCP: Dettinger, [name], MD Referring Physician: Florencia Reasons, MD  Reason for Consultation: Establishing goals of care  HPI/Patient Profile: 81 y.o. male admitted on 01/28/2022   Clinical Assessment and Goals of Care: 81 year old gentleman who lives at home with his wife in [place], [place].  His daughter lives nearby.  He has a past medical history of stage IV pancreatic cancer, status post colectomy, hydronephrosis status post nephrostomy, underlying hypertension small bowel obstruction stage II chronic kidney disease.  Patient presented with ongoing abdominal pain, known to have concerns for small bowel obstruction issues.  Underwent a diagnostic laparoscopy with lysis of additions on 2-14.  He is being followed by general surgery and medical oncology.  Has been placed on TPN for nutritional support.  Has been placed on NG tube for suction.  So far, patient has not not had any bowel movements or passed flatus. Palliative medicine team has been consulted for CODE STATUS and goals of care discussions and for additional support. Patient is resting in bed.  He has just been given IV fentanyl.  His pain medication needs noted, chart reviewed.  Introduced myself and palliative care as follows: Palliative medicine is specialized medical care for people living with serious illness. It focuses on providing relief from the symptoms and stress of a serious illness. The goal is to improve quality of life for both the patient and the family. Goals of care: Broad aims of medical therapy in relation to the patient's values and preferences. Our aim is to provide medical care aimed at enabling patients to achieve the goals that matter most to them, given the circumstances of their particular medical  situation and their constraints.  Patient remains in bed with his eyes closed, states that that he has not passed any gas or had a bowel movement.  He complains of pain in his abdomen and back.  Patient states that he wishes to rest, however did give me permission to call and discussed with his wife. Call placed and discussed with wife.  Also separate call placed and discussed with daughter as well.  Brief life review performed.  Goals wishes and values important to the patient and family as a unit attempted to be explored.  In this initial palliative consultation, efforts were made at providing supportive presence, compassionate care and other therapeutic techniques. 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  He states that he has had a lot happen to him with regards to his health.  He has completed living will documents in the past.  His wife states that when they prepared those documents, both patient and his wife elected for no heroic measures at end-of-life."  If we do not make it we do not make it."  I discussed with her that his current CODE STATUS is full code.  Patient's wife  states that this is not in line with his wishes.  She states that he would not want resuscitation and he would not want to be on artificial means at end-of-life.  Hence, we discussed about full scope of DNR/DNI and will hold that it entails.  Patient's wife is in agreement for switching his CODE STATUS from full code to DNR/DNI. Patient's wife asks for ice chips and for oral care.  She states that the patient is complaining about his mouth being too dry and is also complaining about feeling thirsty.  She is very concerned that he continues to lose weight and continues to appear weaker. We acknowledged the serious nature of the patient's condition.  Patient's wife asks what happens if the patient does not regain bowel function or has further functional and cognitive  decline in this hospitalization.  With her permission, I briefly introduced a scope of hospice services and how they can help if the patient has ongoing decline.  For now, discussed with her that efforts are being made at improving the patient's condition, monitoring hospital course and overall disease trajectory of illness and taking it on a step-by-step basis.  Discussed about how the palliative team will continue to follow and try to be an additional resource while the patient is hospitalized.  His wife has been provided with our general team phone number [phone]. HCPOA  Wife Daughter Has another child that lives in [place].  SUMMARY OF RECOMMENDATIONS   CODE STATUS discussions undertaken.  See above.  Clarified as DNR/DNI to be better reflective of patient's previously expressed wishes and previously prepared documents.  Patient's wife states that they have been married for over 55 years and at that she herself is a retired [employer] account executive.  They have discussed end-of-life wishes in the past several times. Continue current mode of care and patient and family hopeful for some degree of return of bowel function and for initiation of some kind of oral diet.  Palliative will continue to follow along and help monitor. We will follow overall hospital course and disease trajectory of illness so as to be able to be of service in guiding patient and family with next steps, further goals of care decisions. Thank you for the consult.  Code Status/Advance Care Planning: DNR   Symptom Management:     Palliative Prophylaxis:  Delirium Protocol   Psycho-social/Spiritual:  Desire for further Chaplaincy support:yes Additional Recommendations: Caregiving  Support/Resources  Prognosis:  Unable to determine  Discharge Planning: To Be Determined      Primary Diagnoses: Present on Admission:  SBO (small bowel obstruction) (HCC)  Stage 3a chronic kidney disease (CKD) ([place])  Essential hypertension   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  with metastatic disease  Hydronephrosis of left kidney   I have reviewed the medical record, interviewed the patient and family, and examined the patient. The following aspects are pertinent.  Past Medical History:  Diagnosis Date   Colostomy present ([place])    Large bowel obstruction ([place])    Pancreatic cancer ([place])    Social History   Socioeconomic History   Marital status: Married    Spouse name: Not on file   Number of children: 2   Years of education: Not on file   Highest education level: Not on file  Occupational History   Occupation: retired  Tobacco Use   Smoking status: Never   Smokeless tobacco: Never  Vaping Use   Vaping Use: Never used  Substance and Sexual Activity  Alcohol use: No    Alcohol/week: 0.0 standard drinks   Drug use: No   Sexual activity: Yes  Other Topics Concern   Not on file  Social History Narrative   Not on file   Social Determinants of Health   Financial Resource Strain: Not on file  Food Insecurity: Not on file  Transportation Needs: Not on file  Physical Activity: Not on file  Stress: Not on file  Social Connections: Not on file   Family History  Problem Relation Age of Onset   Stroke Mother    Scheduled Meds:  Chlorhexidine Gluconate Cloth  6 each Topical Daily   enoxaparin (LOVENOX) injection  40 mg Subcutaneous Q24H   insulin aspart  0-9 Units Subcutaneous Q6H   lidocaine  1 patch Transdermal Q24H   lip balm  1 application Topical BID   magic mouthwash  15 mL Oral TID   sodium chloride flush  3 mL Intravenous Q12H   Continuous Infusions:  sodium chloride Stopped (01/28/22 2301)   lactated ringers     methocarbamol (ROBAXIN) IV 1,000 mg (02/06/22 0604)   piperacillin-tazobactam (ZOSYN)  IV 3.375 g (02/06/22 1250)   TPN ADULT (ION) 85 mL/hr at 02/05/22 1742   TPN ADULT (ION)     PRN Meds:.sodium chloride, alum & mag hydroxide-simeth, antiseptic oral rinse,  diphenhydrAMINE, fentaNYL (SUBLIMAZE) injection, lactated ringers, menthol-cetylpyridinium, phenol, prochlorperazine, simethicone, sodium chloride flush Medications Prior to Admission:  Prior to Admission medications   Medication Sig Start Date End Date Taking? Authorizing Provider  Multiple Vitamin (MULTIVITAMIN WITH MINERALS) TABS tablet Take 1 tablet by mouth daily.   Yes [provider]  polyethylene glycol (MIRALAX / GLYCOLAX) 17 g packet Take 17 g by mouth 2 (two) times daily. Titrate down to once daily if developing diarrhea. 01/10/22  Yes Mariel Aloe, MD  mirtazapine (REMERON) 15 MG tablet Take 1 tablet (15 mg total) by mouth at bedtime. Patient not taking: Reported on 01/29/2022 12/31/21   Dettinger, [name], MD   Allergies  Allergen Reactions   Pollen Extract Other (See Comments)    Sinus congestion   Review of Systems +admits to pain in abdomen and back  Physical Exam Frail-appearing elderly gentleman resting in bed in Open signs answers questions appropriately Has colostomy in place Has wound dressing Cannot appreciate bowel sounds Appears with generalized muscle weakness and cachexia Regular breath sounds S1-S2 Has NG tube Is on TPN  Vital Signs: BP (!) 159/87 (BP Location: Right Arm)    Pulse 73    Temp 98.1 F (36.7 C) (Oral)    Resp 19    Ht 5\' 11"  (1.803 m)    Wt 64.1 kg    SpO2 94%    BMI 19.71 kg/m  Pain Scale: 0-10 POSS *See Group Information*: S-Acceptable,Sleep, easy to arouse Pain Score: 7    SpO2: SpO2: 94 % O2 Device:SpO2: 94 % O2 Flow Rate: .O2 Flow Rate (L/min): 2 L/min  IO: Intake/output summary:  Intake/Output Summary (Last 24 hours) at 02/06/2022 1335 Last data filed at 02/06/2022 4034 Gross per 24 hour  Intake 2403.11 ml  Output 2210 ml  Net 193.11 ml    LBM: Last BM Date : 02/05/22 Baseline Weight: Weight: 57 kg Most recent weight: Weight: 64.1 kg     Palliative Assessment/Data:   PPS 30%  Time In:  12 Time Out:   1300 Time Total:  60  Greater than 50%  of this time was spent counseling and coordinating care related  to the above assessment and plan.  Signed by: Loistine Chance, MD   Please contact Palliative Medicine Team phone at [phone] for questions and concerns.  For individual provider: See Shea Evans

## 2022-02-06 NOTE — Progress Notes (Signed)
PHARMACY - TOTAL PARENTERAL NUTRITION CONSULT NOTE   Indication: Small bowel obstruction and inability to meet nutritional needs w/ PO intake  Patient Measurements: Height: _0  (180.3 cm) Weight: 64.1 kg (141 lb 5 oz) IBW/kg (Calculated) : 75.3 TPN AdjBW (KG): 57.7 Body mass index is 19.71 kg/m. Usual Weight: Weight noted in November 2022 to be 63.5 kg  Assessment: 81 yo male presenting to [facility] ED for abdominal pain, nausea, vomiting. PMH includes pancreatic cancer with evidence of metastatic disease to his bladder, lungs, and pleural space and history of SBO.  S/p stent placement between stomach and intestines and colostomy placement in June 2022. Pharmacy is consulted to begin TPN for possible bridge to surgery given low pre-albumin and inability to meet nutritional needs w/ PO intake.   Glucose / Insulin: No hx DM; 4 units sensitive SSI required in past 24 hours. CBGs improved (range 120-160).  - Dexamethasone given in OR 2/14  Electrolytes: Na up 135, now WNL. Potassium low at 3.3.  Others, including Corrected Calcium, Mag WNL. Renal: SCr elevated but improved to 1.21, BUN elevated but stable at 40 Hepatic: AST/ALT, Alk Phos WNL. Tbili low. Pre-albumin low at 8.2 (2/11); albumin low at 1.8. TG 115 (2/13) Intake / Output; MIVF: I/O net 291.2; UOP 710 mL. Intake IV charting does not account for full TPN infused.   - No additional IVF GI Imaging: - 2/10 CT abdomen pelvis: re-demonstration of high-grade partial SBO with transition point in the upper left pelvis psych likely due to adhesion.  GI Surgeries / Procedures:  - 2/14: laparoscopic lysis of adhesions, small bowel resection, excisional bipsy of mesentery mass   Central access: PAC TPN start date: 2/12  Nutritional Goals: Goal TPN rate is 85 mL/hr (provides 112g of protein and 2101 kcals per day)  RD Assessment:  Estimated Needs Total Energy Estimated Needs: 2050-2250 kcal Total Protein Estimated Needs: 105-120 grams Total  Fluid Estimated Needs: >/= 2 L/day  Current Nutrition:  NPO except ice chips  TPN  Plan:  KCl 10 mEq every 1 hour x 4 Continue TPN at goal rate of 57m/hr at 1800 Electrolytes in TPN:  Na 121m/L K 2029mL Ca 2mE70m Mg 10mE6mPhos 15mmo28mCl:Ac 1:2 Add standard trace elements daily to TPN. Add MVI to TPN on M/W/F only due to national shortage.  Sensitive SSI q6h and adjust as needed. IVF per MD (none ordered at this time) Monitor TPN labs on Mon/Thurs    MelissRoyetta AsalmD, BCPS CLac [place] utilize Amion for appropriate phone number to reach the unit pharmacist ([facility] [place]/2023 1:58 PM

## 2022-02-06 NOTE — Progress Notes (Signed)
5 Days Post-Op   Chief Complaint/Subjective: No flatus or BM, no nausea or vomiting, NG replaced yesterday  Objective: Vital signs in last 24 hours: Temp:  [98.1 F (36.7 C)-98.7 F (37.1 C)] 98.1 F (36.7 C) (02/19 0531) Pulse Rate:  [83-104] 88 (02/19 0531) Resp:  [17-18] 17 (02/19 0531) BP: (107-158)/(60-94) 158/94 (02/19 0531) SpO2:  [94 %-100 %] 95 % (02/19 0531) Weight:  [64.1 kg] 64.1 kg (02/19 0531) Last BM Date : 02/05/22 Intake/Output from previous day: 02/18 0701 - 02/19 0700 In: 2403.1 [I.V.:2161.8; IV Piggyback:241.3] Out: 2210 [Urine:2210] Intake/Output this shift: No intake/output data recorded.  PE: Gen: NAd Resp: nonlabored Card: RRR Abd: soft, slight tenderness throughout, ostomy pink with minimal output  Lab Results:  Recent Labs    02/04/22 0531 02/05/22 0822  WBC 23.0* 22.8*  HGB 10.8* 10.4*  HCT 34.0* 34.1*  PLT 476* 449*   BMET Recent Labs    02/04/22 0531 02/05/22 0822  NA 133* 135  K 4.0 3.3*  CL 102 103  CO2 25 24  GLUCOSE 159* 144*  BUN 40* 40*  CREATININE 1.31* 1.21  CALCIUM 8.6* 8.5*   PT/INR No results for input(s): LABPROT, INR in the last 72 hours. CMP     Component Value Date/Time   NA 135 02/05/2022 0822   NA 139 12/31/2021 1127   K 3.3 (L) 02/05/2022 0822   CL 103 02/05/2022 0822   CO2 24 02/05/2022 0822   GLUCOSE 144 (H) 02/05/2022 0822   BUN 40 (H) 02/05/2022 0822   BUN 38 (H) 12/31/2021 1127   CREATININE 1.21 02/05/2022 0822   CREATININE 1.58 (H) 11/18/2021 1149   CALCIUM 8.5 (L) 02/05/2022 0822   PROT 5.3 (L) 02/03/2022 0523   PROT 7.2 12/31/2021 1127   ALBUMIN 1.8 (L) 02/03/2022 0523   ALBUMIN 3.9 12/31/2021 1127   AST 22 02/03/2022 0523   AST 44 (H) 11/18/2021 1149   ALT 11 02/03/2022 0523   ALT 45 (H) 11/18/2021 1149   ALKPHOS 56 02/03/2022 0523   BILITOT 0.2 (L) 02/03/2022 0523   BILITOT 0.3 12/31/2021 1127   BILITOT 0.3 11/18/2021 1149   GFRNONAA >60 02/05/2022 0822   GFRNONAA 44 (L)  11/18/2021 1149   GFRAA >60 11/24/2019 1600   Lipase     Component Value Date/Time   LIPASE 42 01/28/2022 1500    Studies/Results: DG Abd 1 View  Result Date: 02/05/2022 CLINICAL DATA:  NG tube placement. EXAM: ABDOMEN - 1 VIEW COMPARISON:  02/02/2022 FINDINGS: Similar central gaseous bowel distention noted upper abdomen. Stent device noted left paramidline abdomen with percutaneous and internal left urinary stents. NG tube tip is positioned in the distal esophagus just proximal to the esophagogastric junction. Tube could be advanced 13-15 cm to place the proximal side port below the GE junction. IMPRESSION: NG tube tip is in the distal esophagus just proximal to the esophagogastric junction. Tube could be advanced 13-15 cm to place the proximal side port below the GE junction. Repeat x-ray could be performed at that time to ensure appropriate positioning. Electronically Signed   By: Misty Flett M.D.   On: 02/05/2022 12:54   US RENAL  Result Date: 02/04/2022 CLINICAL DATA:  Hydronephrosis EXAM: RENAL / URINARY TRACT ULTRASOUND COMPLETE COMPARISON:  CT 01/28/2022 FINDINGS: Right Kidney: Renal measurements: 9.4 x 4.3 x 4.9 cm = volume: 103.7 mL. Atrophic right kidney with moderate right-sided hydronephrosis. Upper pole exophytic cyst measuring up to 2.7 cm. Left Kidney: Renal measurements: 11.3 x 6.4  x 5.8 cm = volume: 218.8 mL. No hydronephrosis. Bladder: Not visualized Other: None. IMPRESSION: Atrophic right kidney with chronic moderate right hydronephrosis. No left-sided hydronephrosis. Electronically Signed   By: Maurine Simmering M.D.   On: 02/04/2022 18:26   DG CHEST PORT 1 VIEW  Result Date: 02/04/2022 CLINICAL DATA:  Cough.  History of pancreatic cancer. EXAM: PORTABLE CHEST 1 VIEW COMPARISON:  January 07, 2022. FINDINGS: Stable cardiomediastinal silhouette. Nasogastric tube is seen entering stomach. Right internal jugular Port-A-Cath is unchanged. Bilateral pulmonary nodules are noted. Stable  probable loculated left pleural effusion is noted. Bony thorax is unremarkable. IMPRESSION: Stable bilateral pulmonary nodules consistent with metastatic disease. Stable loculated left pleural effusion. Electronically Signed   By: Marijo Conception M.D.   On: 02/04/2022 11:24    Anti-infectives: Anti-infectives (From admission, onward)    Start     Dose/Rate Route Frequency Ordered Stop   02/04/22 1545  piperacillin-tazobactam (ZOSYN) IVPB 3.375 g        3.375 g 12.5 mL/hr over 240 Minutes Intravenous Every 8 hours 02/04/22 1453 02/09/22 1359   02/01/22 2200  ceFAZolin (ANCEF) IVPB 1 g/50 mL premix  Status:  Discontinued        1 g 100 mL/hr over 30 Minutes Intravenous Every 8 hours 02/01/22 1504 02/04/22 1453   02/01/22 0930  cefoTEtan (CEFOTAN) 2 g in sodium chloride 0.9 % 100 mL IVPB        2 g 200 mL/hr over 30 Minutes Intravenous On call to O.R. 01/31/22 1908 02/01/22 1533   01/30/22 1000  cefTRIAXone (ROCEPHIN) 1 g in sodium chloride 0.9 % 100 mL IVPB  Status:  Discontinued        1 g 200 mL/hr over 30 Minutes Intravenous Every 24 hours 01/29/22 0013 02/01/22 1504   01/28/22 1830  cefTRIAXone (ROCEPHIN) 1 g in sodium chloride 0.9 % 100 mL IVPB        1 g 200 mL/hr over 30 Minutes Intravenous  Once 01/28/22 1828 01/28/22 1927       Assessment/Plan  s/p Procedure(s): LAPAROSCOPIC LYSIS OF ADHESIONS SMALL BOWEL RESECTION 02/01/2022   FEN - NPO, TPN VTE - lovenox ID - zosyn Disposition - XR to follow up SBO progression   LOS: 8 days   I reviewed last 24 h vitals and pain scores, last 48 h intake and output, last 24 h labs and trends, and last 24 h imaging results.  This care required high  level of medical decision making.   [facility] 02/06/2022, 7:56 AM Please see Amion for pager number during day hours 7:00am-4:30pm or 7:00am -11:30am on weekends

## 2022-02-06 NOTE — Progress Notes (Signed)
°  Progress Note   Patient: Kevin Villegas EML:[id number] DOB: Oct 12, 1941 DOA: 01/28/2022     8 DOS: the patient was seen and examined on 02/06/2022   Brief hospital course: 81 y.o. male with medical history significant of stage 4 pancreatic cancer, status post colectomy, hydronephrosis status post left nephrostomy, hypertension, SBO, CKD 3A presents with ongoing abdominal pain, found to have SBO, s/p diagnostic laparoscopy with lysis of adhesions 2/14, n.p.o., on NG suction, awaiting return of bowel function    Assessment and Plan:  Small bowel obstruction, recurrent > Patient presented with abdominal pain  >-Pt to undergo diagnostic laparoscopy with lysis of adhesions 2/14 -Now on TPN for nutritional support -NG suction -will follow general surgery recommendation   Leukocytosis -UA suggestive of UTI, -urine cx pos for pan-sensitive ecoli -treated with 7 days of cefazolin for presumed UTI, not sure if active infection versus colonization in the setting of nephrostomy tube -But patient has persistent leukocytosis, WBC worsening, case discussed with general surgery Dr. [name] on 2/18 who recommended Zosyn for total of 5 days to cover possible intra-abdominal infection though on exam not consistent with peritonitis  Stage 4 Pancreatic cancer > Known metastatic pancreatic cancer.  Follows with oncology, Dr. Marin Olp. - Appreciate medical oncology  -Appreciate palliative care Dr Rowe Pavy  input    Hydronephrosis from tumor compression > Status post left nephrostomy, last changed on 1/30 -Case discussed with urology who recommended renal ultrasound on 2/17 which showed chronic right hydronephrosis with atrophic right kidney, no hydronephrosis on left -Urology recommend outpatient follow-up, please see urology consult note on 2/18 by Dr. Milford Cage  CKD 3A > cr fluctuating, monitor Renal dosing meds  Hypertension - Not currently any medication for this -BP stable and controlled at this  time       Subjective: POD#5, remains n.p.o. ,on NG suction , no colostomy output, abdomen is soft,   He is tolerating tpn, No fever  still reports pain, getting prn  iv analgesics    Physical Exam: Vitals:   02/05/22 1000 02/05/22 1212 02/06/22 0531 02/06/22 1220  BP: 127/81 107/60 (!) 158/94 (!) 159/87  Pulse: 83 (!) 104 88 73  Resp:  18 17 19   Temp: 98.7 F (37.1 C) 98.7 F (37.1 C) 98.1 F (36.7 C) 98.1 F (36.7 C)  TempSrc: Oral Oral Oral Oral  SpO2: 94% 100% 95% 94%  Weight:   64.1 kg   Height:       General exam: Weak, alert and interactive Respiratory system: normal chest rise, clear, no audible wheezing, diminished breath sound on the left Cardiovascular system: regular rhythm, s1-s2 Gastrointestinal system: + colostomy, no output, I did not hear any bowel sounds, ab is less distended, soft, no significant tenderness today, +left nephrostomy tube Central nervous system: No seizures, no tremors Extremities: No cyanosis, no joint deformities Skin: No rashes, no pallor Psychiatry: Calm, no agitation  Data Reviewed:  Labs reviewed, Cr improved  Family Communication: Pt in room, family not at bedside  Disposition: Status is: Inpatient Requires inpatient because: Severity of illness    Planned Discharge Destination: Home     Author: Florencia Reasons, MD PhD FACP 02/06/2022 2:39 PM  For on call review [email or web address].

## 2022-02-06 NOTE — Progress Notes (Signed)
Mr. Shenker is having slow recovery.  He still has not had any bowel movement or flatus.  He still is on TPN.  He has an NG tube in place.  He does look quite frail.  There are no labs back yet.  He is little bit sore.  He has had no issues with fever.  He is on Zosyn.He had E. coli in his urine.  He has really not gotten out of bed.  His labs yesterday showed a white cell count of 22.8.  Hemoglobin 10.4.  Platelet count 449,000.  His physical exam shows a temperature of 98.1.  Pulse 88.  Blood pressure 158/94.  His lungs sound pretty clear.  He has good air movement bilaterally.  Cardiac exam regular rate and rhythm.  He has no murmurs.  Abdomen is slightly distended.  He has a colostomy in place.  He has a wound dressed.  Bowel sounds are really minimally active.  Extremity shows no clubbing, cyanosis or edema.  He does have some symmetric muscle atrophy.  I do very much appreciate the incredible help that surgery provided.  I know this really allowed [place] to know what was going on with him.  I really would like to try systemic chemotherapy but I worry that his performance status may not be adequate for treatment.  Maybe, once he is able to eat, he will be able to improve his status.  I do appreciate the incredible care he is getting from all the great staff on 6 E.    Lattie Haw, MD  Psalm 147:3

## 2022-02-07 ENCOUNTER — Inpatient Hospital Stay (HOSPITAL_COMMUNITY): Payer: Medicare Other

## 2022-02-07 DIAGNOSIS — Z515 Encounter for palliative care: Secondary | ICD-10-CM | POA: Diagnosis not present

## 2022-02-07 DIAGNOSIS — C252 Malignant neoplasm of tail of pancreas: Secondary | ICD-10-CM | POA: Diagnosis not present

## 2022-02-07 DIAGNOSIS — K56609 Unspecified intestinal obstruction, unspecified as to partial versus complete obstruction: Secondary | ICD-10-CM | POA: Diagnosis not present

## 2022-02-07 DIAGNOSIS — Z7189 Other specified counseling: Secondary | ICD-10-CM | POA: Diagnosis not present

## 2022-02-07 LAB — COMPREHENSIVE METABOLIC PANEL
ALT: 122 U/L — ABNORMAL HIGH (ref 0–44)
AST: 207 U/L — ABNORMAL HIGH (ref 15–41)
Albumin: 1.7 g/dL — ABNORMAL LOW (ref 3.5–5.0)
Alkaline Phosphatase: 218 U/L — ABNORMAL HIGH (ref 38–126)
Anion gap: 9 (ref 5–15)
BUN: 45 mg/dL — ABNORMAL HIGH (ref 8–23)
CO2: 25 mmol/L (ref 22–32)
Calcium: 8.6 mg/dL — ABNORMAL LOW (ref 8.9–10.3)
Chloride: 106 mmol/L (ref 98–111)
Creatinine, Ser: 1.27 mg/dL — ABNORMAL HIGH (ref 0.61–1.24)
GFR, Estimated: 57 mL/min — ABNORMAL LOW (ref >60)
Glucose, Bld: 193 mg/dL — ABNORMAL HIGH (ref 70–99)
Potassium: 3.4 mmol/L — ABNORMAL LOW (ref 3.5–5.1)
Sodium: 140 mmol/L (ref 135–145)
Total Bilirubin: 0.6 mg/dL (ref 0.3–1.2)
Total Protein: 5.7 g/dL — ABNORMAL LOW (ref 6.5–8.1)

## 2022-02-07 LAB — CBC
HCT: 33.1 % — ABNORMAL LOW (ref 39.0–52.0)
Hemoglobin: 10.6 g/dL — ABNORMAL LOW (ref 13.0–17.0)
MCH: 29.9 pg (ref 26.0–34.0)
MCHC: 32 g/dL (ref 30.0–36.0)
MCV: 93.5 fL (ref 80.0–100.0)
Platelets: 541 10*3/uL — ABNORMAL HIGH (ref 150–400)
RBC: 3.54 MIL/uL — ABNORMAL LOW (ref 4.22–5.81)
RDW: 17.4 % — ABNORMAL HIGH (ref 11.5–15.5)
WBC: 23.5 10*3/uL — ABNORMAL HIGH (ref 4.0–10.5)
nRBC: 0.2 % (ref 0.0–0.2)

## 2022-02-07 LAB — TRIGLYCERIDES: Triglycerides: 119 mg/dL (ref <150)

## 2022-02-07 LAB — GLUCOSE, CAPILLARY
Glucose-Capillary: 129 mg/dL — ABNORMAL HIGH (ref 70–99)
Glucose-Capillary: 138 mg/dL — ABNORMAL HIGH (ref 70–99)
Glucose-Capillary: 158 mg/dL — ABNORMAL HIGH (ref 70–99)
Glucose-Capillary: 194 mg/dL — ABNORMAL HIGH (ref 70–99)

## 2022-02-07 LAB — PHOSPHORUS: Phosphorus: 3.6 mg/dL (ref 2.5–4.6)

## 2022-02-07 LAB — MAGNESIUM: Magnesium: 2.2 mg/dL (ref 1.7–2.4)

## 2022-02-07 MED ORDER — TRAVASOL 10 % IV SOLN
INTRAVENOUS | Status: AC
  Filled 2022-02-07: qty 1122

## 2022-02-07 MED ORDER — INSULIN ASPART 100 UNIT/ML IJ SOLN
0.0000 [IU] | Freq: Four times a day (QID) | INTRAMUSCULAR | Status: DC
  Administered 2022-02-07: 2 [IU] via SUBCUTANEOUS
  Administered 2022-02-07: 3 [IU] via SUBCUTANEOUS
  Administered 2022-02-08 (×4): 2 [IU] via SUBCUTANEOUS
  Administered 2022-02-09: 3 [IU] via SUBCUTANEOUS
  Administered 2022-02-09: 2 [IU] via SUBCUTANEOUS
  Administered 2022-02-09 – 2022-02-10 (×3): 3 [IU] via SUBCUTANEOUS
  Administered 2022-02-10 (×2): 2 [IU] via SUBCUTANEOUS
  Administered 2022-02-11: 3 [IU] via SUBCUTANEOUS
  Administered 2022-02-11: 2 [IU] via SUBCUTANEOUS
  Administered 2022-02-11: 3 [IU] via SUBCUTANEOUS
  Administered 2022-02-12 (×3): 2 [IU] via SUBCUTANEOUS
  Administered 2022-02-12: 3 [IU] via SUBCUTANEOUS
  Administered 2022-02-14 – 2022-02-18 (×6): 2 [IU] via SUBCUTANEOUS

## 2022-02-07 MED ORDER — POTASSIUM CHLORIDE 10 MEQ/100ML IV SOLN
10.0000 meq | INTRAVENOUS | Status: AC
  Administered 2022-02-07 (×4): 10 meq via INTRAVENOUS
  Filled 2022-02-07 (×4): qty 100

## 2022-02-07 MED ORDER — IOHEXOL 9 MG/ML PO SOLN
500.0000 mL | ORAL | Status: AC
  Administered 2022-02-07 (×2): 500 mL via ORAL

## 2022-02-07 MED ORDER — POTASSIUM CHLORIDE 10 MEQ/50ML IV SOLN
10.0000 meq | INTRAVENOUS | Status: DC
  Filled 2022-02-07 (×4): qty 50

## 2022-02-07 MED ORDER — IOHEXOL 300 MG/ML  SOLN: 100.0000 mL | Freq: Once | INTRAMUSCULAR | Status: DC | PRN

## 2022-02-07 NOTE — Progress Notes (Signed)
PHARMACY - TOTAL PARENTERAL NUTRITION CONSULT NOTE   Indication: Small bowel obstruction and inability to meet nutritional needs w/ PO intake  Patient Measurements: Height: 5\' 11"  (180.3 cm) Weight: 67.1 kg (147 lb 14.9 oz) IBW/kg (Calculated) : 75.3 TPN AdjBW (KG): 57.7 Body mass index is 20.63 kg/m. Usual Weight: Weight noted in November 2022 to be 63.5 kg  Assessment: 81 yo male presenting to [facility] ED for abdominal pain, nausea, vomiting. PMH includes pancreatic cancer with evidence of metastatic disease to his bladder, lungs, and pleural space and history of SBO.  S/p stent placement between stomach and intestines and colostomy placement in June 2022. Pharmacy is consulted to begin TPN for possible bridge to surgery given low pre-albumin and inability to meet nutritional needs w/ PO intake.   Glucose / Insulin: No hx DM; 6 units sensitive SSI required in past 24 hours. CBGs ok but slight trend up (range 130-190).  Electrolytes: WNL except K low at 3.4. Renal: SCr elevated but improved to 1.27, BUN elevated but stable at 45 Hepatic: LFTs and Tbili increasing, monitoring. Pre-albumin low at 8.2 (2/11); albumin low at 1.8. TG 119 Intake / Output; MIVF: UOP ok. Intake IV charting does not account for full TPN infused.  Currently no flatus or BM. GI Imaging: - 2/10 CT abdomen pelvis: re-demonstration of high-grade partial SBO with transition point in the upper left pelvis psych likely due to adhesion.  GI Surgeries / Procedures:  - 2/14: laparoscopic lysis of adhesions, small bowel resection, excisional bipsy of mesentery mass   Central access: PAC TPN start date: 2/12  Nutritional Goals: Goal TPN rate is 85 mL/hr (provides 112g of protein and 2061 kcals per day)  RD Assessment:  Estimated Needs Total Energy Estimated Needs: 2050-2250 kcal Total Protein Estimated Needs: 105-120 grams Total Fluid Estimated Needs: >/= 2 L/day  Current Nutrition:  NPO except ice chips  TPN  Plan:   KCl 10 mEq every 1 hour x 4 Continue TPN at goal rate of 20mL/hr at 1800 Electrolytes in TPN:  Na decrease to 176mEq/L K increase to 70mEq/L Ca 79mEq/L Mg 13mEq/L Phos 73mmol/L Cl:Ac 1:2 Add standard trace elements daily to TPN. Add MVI to TPN on M/W/F only due to national shortage.  Increase to moderate SSI q6h and adjust as needed. Monitor Cmet tomorrow, TPN labs on Mon/Thurs   Elenor Quinones, PharmD, [place], [place] Clinical Pharmacist 02/07/2022 7:19 AM

## 2022-02-07 NOTE — Progress Notes (Signed)
Daily Progress Note   Patient Name: Kevin Villegas       Date: 02/07/2022 DOB: Jun 13, 1941  Age: 81 y.o. MRN#: [id number] Attending Physician: Lorella Nimrod, MD Primary Care Physician: Dettinger, [name], MD Admit Date: 01/28/2022  Reason for Consultation/Follow-up: Establishing goals of care  Subjective:  Feels some better, participated some with PT, surgery following closely, prn Fentanyl being used, call placed and updated wife.   Length of Stay: 9  Current Medications: Scheduled Meds:   Chlorhexidine Gluconate Cloth  6 each Topical Daily   enoxaparin (LOVENOX) injection  40 mg Subcutaneous Q24H   insulin aspart  0-15 Units Subcutaneous Q6H   lidocaine  1 patch Transdermal Q24H   lip balm  1 application Topical BID   sodium chloride flush  3 mL Intravenous Q12H    Continuous Infusions:  sodium chloride Stopped (01/28/22 2301)   methocarbamol (ROBAXIN) IV 1,000 mg (02/07/22 0505)   piperacillin-tazobactam (ZOSYN)  IV 3.375 g (02/07/22 0506)   potassium chloride 10 mEq (02/07/22 1057)   TPN ADULT (ION) 85 mL/hr at 02/06/22 1730   TPN ADULT (ION)      PRN Meds: sodium chloride, alum & mag hydroxide-simeth, antiseptic oral rinse, diphenhydrAMINE, fentaNYL (SUBLIMAZE) injection, menthol-cetylpyridinium, phenol, prochlorperazine, simethicone, sodium chloride flush  Physical Exam         Appears chronically ill.  Resting in bed Ostomy with no output yet Has NGT No edema Awake alert S 1 S 2   Vital Signs: BP (!) 150/88 (BP Location: Right Arm)    Pulse 77    Temp 97.9 F (36.6 C) (Oral)    Resp 17    Ht 5\' 11"  (1.803 m)    Wt 67.1 kg    SpO2 93%    BMI 20.63 kg/m  SpO2: SpO2: 93 % O2 Device: O2 Device: Room Air O2 Flow Rate: O2 Flow Rate (L/min): 2 L/min  Intake/output  summary:  Intake/Output Summary (Last 24 hours) at 02/07/2022 1117 Last data filed at 02/07/2022 1039 Gross per 24 hour  Intake 0 ml  Output 1475 ml  Net -1475 ml   LBM: Last BM Date : 02/05/22 Baseline Weight: Weight: 57 kg Most recent weight: Weight: 67.1 kg       Palliative Assessment/Data:      Patient Active Problem List   Diagnosis  Date Noted   E-coli UTI 02/01/2022   SBO (small bowel obstruction) ([place]) 01/28/2022   Subcutaneous nodule of abdominal wall 01/09/2022   Partial small bowel obstruction ([place]) 01/07/2022   COVID-19 virus infection 01/07/2022   Colostomy in place [facility]) 11/10/2021   Stage 3a chronic kidney disease (CKD) ([place]) 11/10/2021   Pyelonephritis    Essential hypertension 10/30/2021   Hydronephrosis of left kidney 10/28/2021   Ureteral stricture, left 10/27/2021   Protein-calorie malnutrition, severe 06/12/2021   Hydroureter on right 06/10/2021   Aortic atherosclerosis ([place]) 06/10/2021   Right ureteral stone 06/02/2021   Colon stricture ([place])    Pancreatic mass 06/27/2019   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  with metastatic disease 06/27/2019   Rhinitis medicamentosa 02/26/2019    Palliative Care Assessment & Plan   Patient Profile:    Assessment:  81 year old gentleman who lives at home with his wife in [place], [place].  His daughter lives nearby.  He has a past medical history of stage IV pancreatic cancer, status post colectomy, hydronephrosis status post nephrostomy, underlying hypertension small bowel obstruction stage II chronic kidney disease.  Patient presented with ongoing abdominal pain, known to have concerns for small bowel obstruction issues.  Underwent a diagnostic laparoscopy with lysis of additions on 2-14.  He is being followed by general surgery and medical oncology.  Has been placed on TPN for nutritional support.  Has been placed on NG tube for suction.  So far, patient has not not had any bowel movements  or passed flatus. Palliative medicine team has been consulted for CODE STATUS and goals of care discussions and for additional support.  Recommendations/Plan: DNR Continue current mode of care.   Call placed and updated his wife, surgery likely to do CT abdomen at some point.  Continue current mode of care: patient and family hopeful for some degree of return of bowel function and for initiation of some kind of oral diet.  Palliative will continue to follow along and help monitor. We will follow overall hospital course and disease trajectory of illness so as to be able to be of service in guiding patient and family with next steps, further goals of care decisions.     Code Status:    Code Status Orders  (From admission, onward)           Start     Ordered   02/06/22 1335  Do not attempt resuscitation (DNR)  Continuous       Question Answer Comment  In the event of cardiac or respiratory ARREST Do not call a code blue   In the event of cardiac or respiratory ARREST Do not perform Intubation, CPR, defibrillation or ACLS   In the event of cardiac or respiratory ARREST Use medication by any route, position, wound care, and other measures to relive pain and suffering. May use oxygen, suction and manual treatment of airway obstruction as needed for comfort.      02/06/22 1334           Code Status History     Date Active Date Inactive Code Status Order ID Comments User Context   01/29/2022 0014 02/06/2022 1334 Full Code [id number]  Marcelyn Bruins, MD Inpatient   01/07/2022 2129 01/10/2022 2258 Full Code [id number]  Rise Patience, MD Inpatient   12/10/2021 1331 12/11/2021 2137 Full Code [id number]  Jonnie Finner, DO ED   11/10/2021 0354 11/13/2021 2039 Full Code [id number]  Chotiner, Yevonne Aline, MD Inpatient  10/27/2021 2317 10/30/2021 1833 Full Code [id number]  Lenore Cordia, MD Inpatient   06/09/2021 2201 06/18/2021 1923 Full Code [id number]  Lenore Cordia, MD Inpatient    06/01/2021 2102 06/05/2021 1830 Full Code [id number]  Rhetta Mura, DO ED   05/18/2021 1200 05/26/2021 1958 Partial Code [id number]  Jonnie Finner, DO Inpatient   12/12/2014 1730 12/17/2014 1704 Full Code [id number]  Velvet Bathe, MD Inpatient       Prognosis:  Guarded   Discharge Planning: To Be Determined  Care plan was discussed with patient, wife on the phone, also corresponded with RN colleague, appreciate her help.   Thank you for allowing the Palliative Medicine Team to assist in the care of this patient.   Time In: 10 Time Out: 10.35 Total Time 35 Prolonged Time Billed  no       Greater than 50%  of this time was spent counseling and coordinating care related to the above assessment and plan.  Loistine Chance, MD  Please contact Palliative Medicine Team phone at [phone] for questions and concerns.

## 2022-02-07 NOTE — Progress Notes (Signed)
Progress Note  6 Days Post-Op  Subjective: Still no ostomy output. Abdominal pain is better but still 5/10. States he was able to get out of bed and ambulate yesterday. No nausea or emesis. No respiratory or urinary complaints  Objective: Vital signs in last 24 hours: Temp:  [97.9 F (36.6 C)-98.1 F (36.7 C)] 97.9 F (36.6 C) (02/20 0553) Pulse Rate:  [73-90] 77 (02/20 0553) Resp:  [17-19] 17 (02/20 0553) BP: (144-159)/(87-95) 150/88 (02/20 0553) SpO2:  [92 %-94 %] 93 % (02/20 0553) Weight:  [67.1 kg] 67.1 kg (02/20 0607) Last BM Date : 02/05/22  Intake/Output from previous day: 02/19 0701 - 02/20 0700 In: 0  Out: 1325 [Urine:1325] Intake/Output this shift: No intake/output data recorded.  PE: General: chronically ill appearing, male who is laying in bed in NAD HEENT: head is normocephalic, atraumatic. Mouth is pink and moist Heart: Palpable radial pulses bilaterally Lungs: Respiratory effort nonlabored Abd: soft, ND, hypoactive BS, moderate TTP without rebound or guarding. incisions intact no erythema, induration, or purulent drainage. No air or stool in colostomy. NGT with rusty brown output MSK: all 4 extremities are symmetrical with no cyanosis, clubbing, or edema. No calf edema or TTP bilaterally Skin: warm and dry Psych: A&Ox3 with an appropriate affect.    Lab Results:  Recent Labs    02/05/22 0822 02/07/22 0517  WBC 22.8* 23.5*  HGB 10.4* 10.6*  HCT 34.1* 33.1*  PLT 449* 541*    BMET Recent Labs    02/06/22 1115 02/07/22 0517  NA 139 140  K 3.3* 3.4*  CL 107 106  CO2 25 25  GLUCOSE 162* 193*  BUN 45* 45*  CREATININE 1.28* 1.27*  CALCIUM 8.4* 8.6*    PT/INR No results for input(s): LABPROT, INR in the last 72 hours. CMP     Component Value Date/Time   NA 140 02/07/2022 0517   NA 139 12/31/2021 1127   K 3.4 (L) 02/07/2022 0517   CL 106 02/07/2022 0517   CO2 25 02/07/2022 0517   GLUCOSE 193 (H) 02/07/2022 0517   BUN 45 (H) 02/07/2022  0517   BUN 38 (H) 12/31/2021 1127   CREATININE 1.27 (H) 02/07/2022 0517   CREATININE 1.58 (H) 11/18/2021 1149   CALCIUM 8.6 (L) 02/07/2022 0517   PROT 5.7 (L) 02/07/2022 0517   PROT 7.2 12/31/2021 1127   ALBUMIN 1.7 (L) 02/07/2022 0517   ALBUMIN 3.9 12/31/2021 1127   AST 207 (H) 02/07/2022 0517   AST 44 (H) 11/18/2021 1149   ALT 122 (H) 02/07/2022 0517   ALT 45 (H) 11/18/2021 1149   ALKPHOS 218 (H) 02/07/2022 0517   BILITOT 0.6 02/07/2022 0517   BILITOT 0.3 12/31/2021 1127   BILITOT 0.3 11/18/2021 1149   GFRNONAA 57 (L) 02/07/2022 0517   GFRNONAA 44 (L) 11/18/2021 1149   GFRAA >60 11/24/2019 1600   Lipase     Component Value Date/Time   LIPASE 42 01/28/2022 1500       Studies/Results: DG Abd 1 View  Result Date: 02/05/2022 CLINICAL DATA:  NG tube placement. EXAM: ABDOMEN - 1 VIEW COMPARISON:  02/02/2022 FINDINGS: Similar central gaseous bowel distention noted upper abdomen. Stent device noted left paramidline abdomen with percutaneous and internal left urinary stents. NG tube tip is positioned in the distal esophagus just proximal to the esophagogastric junction. Tube could be advanced 13-15 cm to place the proximal side port below the GE junction. IMPRESSION: NG tube tip is in the distal esophagus just proximal to  the esophagogastric junction. Tube could be advanced 13-15 cm to place the proximal side port below the GE junction. Repeat x-ray could be performed at that time to ensure appropriate positioning. Electronically Signed   By: Misty Buer M.D.   On: 02/05/2022 12:54   DG Abd Portable 1V  Result Date: 02/06/2022 CLINICAL DATA:  Small-bowel obstruction, resection 2/14, no bowel movement EXAM: PORTABLE ABDOMEN - 1 VIEW COMPARISON:  02/05/2022 FINDINGS: Similar appearance of gas and fluid filled, distended small bowel in the central abdomen, largest loops measuring up to 5.0 cm in caliber. Esophagogastric tube remains with tip at the level of the gastroesophageal junction,  side port in the lower esophagus. Bowel stent projects at the left aspect of the lumbar spine. Left-sided percutaneous nephrostomy catheter and left-sided ureteral stent, partially imaged. No obvious free air in the abdomen on single supine radiograph. IMPRESSION: 1. Similar appearance of gas and fluid filled, distended small bowel in the central abdomen, largest loops measuring up to 5.0 cm in caliber. Findings are consistent with ileus or obstruction. 2. Esophagogastric tube remains with tip at the level of the gastroesophageal junction, side port in the lower esophagus. Electronically Signed   By: Delanna Ahmadi M.D.   On: 02/06/2022 10:07    Anti-infectives: Anti-infectives (From admission, onward)    Start     Dose/Rate Route Frequency Ordered Stop   02/04/22 1545  piperacillin-tazobactam (ZOSYN) IVPB 3.375 g        3.375 g 12.5 mL/hr over 240 Minutes Intravenous Every 8 hours 02/04/22 1453 02/09/22 1359   02/01/22 2200  ceFAZolin (ANCEF) IVPB 1 g/50 mL premix  Status:  Discontinued        1 g 100 mL/hr over 30 Minutes Intravenous Every 8 hours 02/01/22 1504 02/04/22 1453   02/01/22 0930  cefoTEtan (CEFOTAN) 2 g in sodium chloride 0.9 % 100 mL IVPB        2 g 200 mL/hr over 30 Minutes Intravenous On call to O.R. 01/31/22 1908 02/01/22 1533   01/30/22 1000  cefTRIAXone (ROCEPHIN) 1 g in sodium chloride 0.9 % 100 mL IVPB  Status:  Discontinued        1 g 200 mL/hr over 30 Minutes Intravenous Every 24 hours 01/29/22 0013 02/01/22 1504   01/28/22 1830  cefTRIAXone (ROCEPHIN) 1 g in sodium chloride 0.9 % 100 mL IVPB        1 g 200 mL/hr over 30 Minutes Intravenous  Once 01/28/22 1828 01/28/22 1927        Assessment/Plan Stage IV pancreatic cancer with psbo  - POD6 s/p Lap LOA, Small bowel resection, excisional biopsy of mesentery mass by Dr. 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 Continue to monitor abd exam and WBC. Given leukocytosis and ongoing ileus CT scan may be helpful to evaluate for intraabdominal process. Will discuss utility and timing with MD - abd xray 2/20 - NGT at GE junction, continued SB distension though diminished from prior. Will ask RN to advance - NGT with 300 ml in cannister, nothing charted. No gas or stool in ostomy. Continue LIWS and AROBF - continue TPN, LFTs elevated in setting of TPN - hypokalemia - repletion has been ordered - PT/OT - recc snf with possible progression to HH  FEN: NPO/NGT LIWS, TPN ID: cefotetan periop, ancef (UTI), zosyn 2/17>> VTE: lovenox  E coli UTI CKD 3A HTN   LOS: 9 days   Winferd Humphrey, [facility] 02/07/2022, 7:31 AM Please see Amion for pager number during day hours 7:00am-4:30pm

## 2022-02-07 NOTE — Progress Notes (Signed)
Physical Therapy Treatment Patient Details Name: Kevin Villegas MRN: [id number] DOB: April 17, 1941 Today's Date: 02/07/2022   History of Present Illness Patient is a 81 year old male who presented on 01/28/22 with 2-3 day history of abdominal pain. patient was found to have SBO. patient underwent diagnostic laparoscopy with lysis of adhesions 2/14. PMH:HTN, stage 4 pancreatic cancer,CKD III, status post colectomy, hydronephrosis status post nephrostomy,    PT Comments    The patient is asking to get OOB. Mod assist to sit up and 2 HHA to stand and pivot to recliner. Patient reports HA and abdominal and back pain. RN aware. Continue PT for progressive mobility as tolerated.   Recommendations for follow up therapy are one component of a multi-disciplinary discharge planning process, led by the attending physician.  Recommendations may be updated based on patient status, additional functional criteria and insurance authorization.  Follow Up Recommendations  Skilled nursing-short term rehab (<3 hours/day)     Assistance Recommended at Discharge Frequent or constant Supervision/Assistance  Patient can return home with the following A lot of help with walking and/or transfers;A lot of help with bathing/dressing/bathroom;Assistance with cooking/housework;Assist for transportation   Equipment Recommendations  Rolling walker (2 wheels)    Recommendations for Other Services       Precautions / Restrictions Precautions Precautions: Fall Precaution Comments: abdominal wounds, ostomy, drain, NG Restrictions Weight Bearing Restrictions: No     Mobility  Bed Mobility Overal bed mobility: Needs Assistance Bed Mobility: Supine to Sit   Sidelying to sit: +2 for safety/equipment, +2 for physical assistance       General bed mobility comments: patient asking for assistance to sit up/long sit, mod  of 2 to pull upright, patient then moved legs around to bed edge, assisted with bed pad to get to bed  edge completely.    Transfers Overall transfer level: Needs assistance Equipment used: 2 person hand held assist Transfers: Sit to/from Stand Sit to Stand: Min assist           General transfer comment: assist of 2 arm hold to power up to stnad, Pivot steps to recliner, assist to descend to sit down to recliner    Ambulation/Gait                   Stairs             Wheelchair Mobility    Modified Rankin (Stroke Patients Only)       Balance   Sitting-balance support: Feet supported Sitting balance-Leahy Scale: Fair Sitting balance - Comments: seated in midline, no lateral lean noted in previous sessions   Standing balance support: During functional activity, Bilateral upper extremity supported, Reliant on assistive device for balance Standing balance-Leahy Scale: Poor                              Cognition Arousal/Alertness: Awake/alert Behavior During Therapy: Anxious, Flat affect Overall Cognitive Status: No family/caregiver present to determine baseline cognitive functioning                                 General Comments: awake and able to follow directions and make needs known, wants to sit up and get to recliner.        Exercises      General Comments        Pertinent Vitals/Pain Pain Assessment Faces Pain Scale: Hurts  whole lot Pain Location: abdomen, back HA Pain Intervention(s): Monitored during session, Limited activity within patient's tolerance    Home Living                          Prior Function            PT Goals (current goals can now be found in the care plan section) Progress towards PT goals: Progressing toward goals    Frequency    Min 3X/week      PT Plan Current plan remains appropriate    Co-evaluation              AM-PAC PT "6 Clicks" Mobility   Outcome Measure  Help needed turning from your back to your side while in a flat bed without using  bedrails?: A Lot Help needed moving from lying on your back to sitting on the side of a flat bed without using bedrails?: A Lot Help needed moving to and from a bed to a chair (including a wheelchair)?: A Lot Help needed standing up from a chair using your arms (e.g., wheelchair or bedside chair)?: A Lot Help needed to walk in hospital room?: Total Help needed climbing 3-5 steps with a railing? : Total 6 Click Score: 10    End of Session   Activity Tolerance: Patient limited by fatigue;Patient limited by pain Patient left: in chair;with call bell/phone within reach;with chair alarm set Nurse Communication: Mobility status PT Visit Diagnosis: Other abnormalities of gait and mobility (R26.89);Muscle weakness (generalized) (M62.81)     Time: 1010-1026 PT Time Calculation (min) (ACUTE ONLY): 16 min  Charges:  $Therapeutic Activity: 8-22 mins                     Tresa Endo PT Acute Rehabilitation Services Pager [phone] Office [phone]    Claretha Cooper 02/07/2022, 10:33 AM

## 2022-02-07 NOTE — Progress Notes (Signed)
°  Progress Note   Patient: Kevin Villegas NOB:[id number] DOB: 04-24-1941 DOA: 01/28/2022     9 DOS: the patient was seen and examined on 02/07/2022   Brief hospital course: 81 y.o. male with medical history significant of stage 4 pancreatic cancer, status post colectomy, hydronephrosis status post left nephrostomy, hypertension, SBO, CKD 3A presents with ongoing abdominal pain, found to have SBO, s/p diagnostic laparoscopy with lysis of adhesions 2/14, n.p.o., on NG suction, awaiting return of bowel function. No colostomy output yet.  Might need a repeat CT abdomen by surgery. NG tube with significant coffee-ground colored secretions.  Assessment and Plan:  Small bowel obstruction, recurrent > Patient presented with abdominal pain  >-Pt to undergo diagnostic laparoscopy with lysis of adhesions 2/14 -Now on TPN for nutritional support -NG suction -follow general surgery recommendation -Might need a repeat CT abdomen   Leukocytosis.  Continue to get worse -UA suggestive of UTI, -urine cx pos for pan-sensitive ecoli -treated with 7 days of cefazolin for presumed UTI, not sure if active infection versus colonization in the setting of nephrostomy tube -But patient has persistent leukocytosis, WBC worsening, case discussed with general surgery Dr. [name] on 2/18 who recommended Zosyn for total of 5 days to cover possible intra-abdominal infection though on exam not consistent with peritonitis  Stage 4 Pancreatic cancer > Known metastatic pancreatic cancer.  Follows with oncology, Dr. Marin Olp. - Appreciate medical oncology  -Appreciate palliative care Dr Rowe Pavy  input    Hydronephrosis from tumor compression > Status post left nephrostomy, last changed on 1/30 -Case discussed with urology who recommended renal ultrasound on 2/17 which showed chronic right hydronephrosis with atrophic right kidney, no hydronephrosis on left -Urology recommend outpatient follow-up, please see urology consult note  on 2/18 by Dr. Milford Cage  CKD 3A > cr fluctuating, monitor Renal dosing meds -Avoid nephrotoxins  Hypertension, mildly elevated blood pressure - Not currently any medication for this -Continue to monitor     Subjective: POD day 6.  Having some abdominal pain, significant coffee-ground color NG secretions.  No colostomy output.  Physical Exam: Vitals:   02/06/22 1220 02/06/22 2110 02/07/22 0553 02/07/22 0607  BP: (!) 159/87 (!) 144/95 (!) 150/88   Pulse: 73 90 77   Resp: 19 18 17    Temp: 98.1 F (36.7 C) 97.9 F (36.6 C) 97.9 F (36.6 C)   TempSrc: Oral Oral Oral   SpO2: 94% 92% 93%   Weight:    67.1 kg  Height:       General.  Frail and malnourished gentleman, in no acute distress. Pulmonary.  Lungs clear bilaterally, normal respiratory effort. CV.  Regular rate and rhythm, no JVD, rub or murmur. Abdomen.  Mildly distended, mild diffuse tenderness, bowel sounds hypoactive CNS.  Alert and oriented .  No focal neurologic deficit. Extremities.  No edema, no cyanosis, pulses intact and symmetrical. Psychiatry.  Judgment and insight appears normal.   Data Reviewed: Labs were reviewed.  Family Communication:   Disposition: Status is: Inpatient Requires inpatient because: Severity of illness    Planned Discharge Destination: PT is recommending SNF  DVT prophylaxis.  Lovenox  This record has been created using Systems analyst. Errors have been sought and corrected,but may not always be located. Such creation errors do not reflect on the standard of care.   Author: Lorella Nimrod, MD  02/07/2022 2:48 PM  For on call review [email or web address].

## 2022-02-07 NOTE — Care Management Important Message (Signed)
Important Message  Patient Details IM Letter placed in Patients room. Name: Alfonza Toft MRN: [id number] Date of Birth: 01/21/1941   Medicare Important Message Given:  Yes     Kerin Salen 02/07/2022, 11:37 AM

## 2022-02-08 DIAGNOSIS — K56600 Partial intestinal obstruction, unspecified as to cause: Secondary | ICD-10-CM

## 2022-02-08 DIAGNOSIS — Z933 Colostomy status: Secondary | ICD-10-CM | POA: Diagnosis not present

## 2022-02-08 DIAGNOSIS — Z7189 Other specified counseling: Secondary | ICD-10-CM | POA: Diagnosis not present

## 2022-02-08 DIAGNOSIS — C252 Malignant neoplasm of tail of pancreas: Secondary | ICD-10-CM | POA: Diagnosis not present

## 2022-02-08 LAB — COMPREHENSIVE METABOLIC PANEL
ALT: 114 U/L — ABNORMAL HIGH (ref 0–44)
AST: 142 U/L — ABNORMAL HIGH (ref 15–41)
Albumin: 1.6 g/dL — ABNORMAL LOW (ref 3.5–5.0)
Alkaline Phosphatase: 245 U/L — ABNORMAL HIGH (ref 38–126)
Anion gap: 8 (ref 5–15)
BUN: 50 mg/dL — ABNORMAL HIGH (ref 8–23)
CO2: 26 mmol/L (ref 22–32)
Calcium: 8.3 mg/dL — ABNORMAL LOW (ref 8.9–10.3)
Chloride: 105 mmol/L (ref 98–111)
Creatinine, Ser: 1.39 mg/dL — ABNORMAL HIGH (ref 0.61–1.24)
GFR, Estimated: 51 mL/min — ABNORMAL LOW (ref >60)
Glucose, Bld: 162 mg/dL — ABNORMAL HIGH (ref 70–99)
Potassium: 3.6 mmol/L (ref 3.5–5.1)
Sodium: 139 mmol/L (ref 135–145)
Total Bilirubin: 0.6 mg/dL (ref 0.3–1.2)
Total Protein: 5.4 g/dL — ABNORMAL LOW (ref 6.5–8.1)

## 2022-02-08 LAB — CBC
HCT: 33 % — ABNORMAL LOW (ref 39.0–52.0)
Hemoglobin: 10.7 g/dL — ABNORMAL LOW (ref 13.0–17.0)
MCH: 30.1 pg (ref 26.0–34.0)
MCHC: 32.4 g/dL (ref 30.0–36.0)
MCV: 93 fL (ref 80.0–100.0)
Platelets: 622 10*3/uL — ABNORMAL HIGH (ref 150–400)
RBC: 3.55 MIL/uL — ABNORMAL LOW (ref 4.22–5.81)
RDW: 17.2 % — ABNORMAL HIGH (ref 11.5–15.5)
WBC: 25.7 10*3/uL — ABNORMAL HIGH (ref 4.0–10.5)
nRBC: 0.1 % (ref 0.0–0.2)

## 2022-02-08 LAB — GLUCOSE, CAPILLARY
Glucose-Capillary: 145 mg/dL — ABNORMAL HIGH (ref 70–99)
Glucose-Capillary: 150 mg/dL — ABNORMAL HIGH (ref 70–99)
Glucose-Capillary: 148 mg/dL — ABNORMAL HIGH (ref 70–99)
Glucose-Capillary: 148 mg/dL — ABNORMAL HIGH (ref 70–99)

## 2022-02-08 MED ORDER — TRAVASOL 10 % IV SOLN
INTRAVENOUS | Status: AC
  Filled 2022-02-08: qty 1122

## 2022-02-08 MED ORDER — POTASSIUM CHLORIDE 10 MEQ/100ML IV SOLN
10.0000 meq | INTRAVENOUS | Status: AC
  Administered 2022-02-08 (×2): 10 meq via INTRAVENOUS
  Filled 2022-02-08 (×2): qty 100

## 2022-02-08 NOTE — Progress Notes (Signed)
Ms. Coone still has a NG tube in place.  He says his colostomy is starting to have some output.  When I listen to his abdomen this morning.  I really cannot hear much in the way of bowel sounds.  The NG tube seems to be having some liquid coffee-ground-like secretions.  He says he is out of bed a little bit.  He said he walked a little bit yesterday.  He did have a CT scan done yesterday.  There is no bowel obstruction.  There is no abscess.  He had a moderate amount of free fluid in the right upper quadrant.  There was body wall edema.  He had a nodule in the right anterior abdominal wall.  His labs show a white count 25.7.  Hemoglobin 10.7.  Platelet count 622,000.  His sodium is 139.  Potassium 3.6.  BUN 50 creatinine 1.39.  His albumin is only 1.6.  SGPT 114 SGOT 142.  Hopefully, he will be able to have some substantial p.o. intake.  I think this is be the best way to try to improve his status.  I just feel that he has had a tough recovery.  I know he is trying hard.  I know that everybody is trying to work hard to get him better.    Lattie Haw, MD  Psalm 6:2

## 2022-02-08 NOTE — Progress Notes (Signed)
Progress Note  7 Days Post-Op  Subjective: Now having colostomy output. No nausea or emesis. Abdominal pain is stable. He denies respiratory complaints  Objective: Vital signs in last 24 hours: Temp:  [98.7 F (37.1 C)-99 F (37.2 C)] 98.7 F (37.1 C) (02/21 0358) Pulse Rate:  [83-89] 84 (02/21 0358) Resp:  [16-18] 18 (02/21 0358) BP: (142-144)/(91-98) 142/91 (02/21 0358) SpO2:  [92 %-95 %] 94 % (02/21 0358) Weight:  [70 kg] 70 kg (02/21 0353) Last BM Date : 02/07/22  Intake/Output from previous day: 02/20 0701 - 02/21 0700 In: 3881.2 [I.V.:3146.7; IV Piggyback:734.6] Out: 1829 [HBZJI:9678; Emesis/NG output:300] Intake/Output this shift: No intake/output data recorded.  PE: General: chronically ill appearing, male who is laying in bed in NAD HEENT: head is normocephalic, atraumatic. Mouth is pink and moist Heart: Palpable radial pulses bilaterally Lungs: Respiratory effort nonlabored Abd: soft, ND, + BS, moderate TTP without rebound or guarding. incisions intact - no erythema, induration, or purulent drainage. Large amount of liquid to soft stool in colostomy. NGT with scant rusty brown output MSK: all 4 extremities are symmetrical with no cyanosis, clubbing, or edema. No calf edema or TTP bilaterally Skin: warm and dry Psych: A&Ox3 with an appropriate affect.    Lab Results:  Recent Labs    02/07/22 0517 02/08/22 0606  WBC 23.5* 25.7*  HGB 10.6* 10.7*  HCT 33.1* 33.0*  PLT 541* 622*    BMET Recent Labs    02/07/22 0517 02/08/22 0606  NA 140 139  K 3.4* 3.6  CL 106 105  CO2 25 26  GLUCOSE 193* 162*  BUN 45* 50*  CREATININE 1.27* 1.39*  CALCIUM 8.6* 8.3*    PT/INR No results for input(s): LABPROT, INR in the last 72 hours. CMP     Component Value Date/Time   NA 139 02/08/2022 0606   NA 139 12/31/2021 1127   K 3.6 02/08/2022 0606   CL 105 02/08/2022 0606   CO2 26 02/08/2022 0606   GLUCOSE 162 (H) 02/08/2022 0606   BUN 50 (H) 02/08/2022 0606    BUN 38 (H) 12/31/2021 1127   CREATININE 1.39 (H) 02/08/2022 0606   CREATININE 1.58 (H) 11/18/2021 1149   CALCIUM 8.3 (L) 02/08/2022 0606   PROT 5.4 (L) 02/08/2022 0606   PROT 7.2 12/31/2021 1127   ALBUMIN 1.6 (L) 02/08/2022 0606   ALBUMIN 3.9 12/31/2021 1127   AST 142 (H) 02/08/2022 0606   AST 44 (H) 11/18/2021 1149   ALT 114 (H) 02/08/2022 0606   ALT 45 (H) 11/18/2021 1149   ALKPHOS 245 (H) 02/08/2022 0606   BILITOT 0.6 02/08/2022 0606   BILITOT 0.3 12/31/2021 1127   BILITOT 0.3 11/18/2021 1149   GFRNONAA 51 (L) 02/08/2022 0606   GFRNONAA 44 (L) 11/18/2021 1149   GFRAA >60 11/24/2019 1600   Lipase     Component Value Date/Time   LIPASE 42 01/28/2022 1500       Studies/Results: CT ABDOMEN PELVIS WO CONTRAST  Result Date: 02/08/2022 CLINICAL DATA:  Abdominal pain. Postop, assess for intra-abdominal abscess. Patient is post small bowel resection and excisional biopsy of mesenteric mass on 02/01/2022 EXAM: CT ABDOMEN AND PELVIS WITHOUT CONTRAST TECHNIQUE: Multidetector CT imaging of the abdomen and pelvis was performed following the standard protocol without IV contrast. Patient declined IV contrast administration. RADIATION DOSE REDUCTION: This exam was performed according to the departmental dose-optimization program which includes automated exposure control, adjustment of the mA and/or kV according to patient size and/or use of iterative  reconstruction technique. COMPARISON:  Preoperative CT 01/28/2022 FINDINGS: Lower chest: Pulmonary metastatic disease in the lung bases with multiple pulmonary nodules. Enlarging left pleural effusion with pleural thickening, possibly metastatic. Increasing compressive atelectasis in the included left lung base. Moderate-sized right pleural effusion is new. Hepatobiliary: Scattered water density lesions in the liver are unchanged from prior exam likely cysts cholecystectomy. Common bile duct is difficult defined, there is no visualized biliary  dilatation. Pancreas: The pancreas is difficult to delineate on the current exam, electronic records states distal pancreatectomy. Spleen: Surgically absent. Adrenals/Urinary Tract: No adrenal nodule. There is right hydroureteronephrosis with the ureter being dilated to the pelvis, the previous hyperattenuating focus in the distal right ureter tentatively visualized series 2, image 75. Marked right renal atrophy. Left nephrostomy and left nephroureteral stent in place. There is no left hydronephrosis. The urinary bladder is poorly defined, appears thick walled. There is air in the bladder. Stomach/Bowel: Detailed bowel assessment is limited given paucity of intra-abdominal fat and motion. Enteric tube terminates in the gastroesophageal junction. The stomach is decompressed. A duodenal stent is unchanged in position, patent. Descending colostomy with Hartmann's pouch. Small bowel is mildly prominent but nonobstructed, and enteric contrast is seen throughout the colon. Bowel loops in the pelvis are difficult to define, there is fluid and stool within the and set to sigmoid colon. Vascular/Lymphatic: Aortic atherosclerosis without aneurysm. Limited assessment for adenopathy given lack of IV contrast and motion artifact. Reproductive: Prostate is unremarkable. Other: There is a small amount of non organized free fluid in the pelvis. Moderate right upper quadrant ascites. Generalized edema of the intra-abdominal and mesenteric fat. There is no evidence of focal fluid collection/abscess. No free air. Diffuse subcutaneous edema. Soft tissue nodule in the right anterior abdominal wall. Musculoskeletal: No focal bone lesion. Degenerative change in the spine and hips. IMPRESSION: 1. No evidence of postoperative intra-abdominal abscess. Moderate right upper quadrant free fluid and small amount of free fluid in the pelvis is non organized. 2. Enteric tube terminates at the gastroesophageal junction. 3. Left nephrostomy and left  nephroureteral stent in place without left hydronephrosis. Chronic right renal atrophy and hydroureteronephrosis. 4. Pulmonary metastatic disease in the lung bases with multiple pulmonary nodules. Enlarging left pleural effusion with pleural thickening, possibly metastatic. Moderate-sized right pleural effusion is new. 5. Diffuse body wall edema. 6. Soft tissue nodule in the right anterior abdominal wall is unchanged, possibly metastatic. 7. Additional chronic findings are unchanged. Aortic Atherosclerosis (ICD10-I70.0). Electronically Signed   By: Keith Rake M.D.   On: 02/08/2022 00:13   DG Abd Portable 1V  Result Date: 02/07/2022 CLINICAL DATA:  NG tube placement EXAM: PORTABLE ABDOMEN - 1 VIEW COMPARISON:  Previous studies including the examination done earlier today FINDINGS: Tip enteric tube is noted at the gastroesophageal junction. There is cephalad migration of the tip of enteric tube since the previous examination. Side port in the enteric tube is noted in the lower thoracic esophagus. Bowel gas pattern is nonspecific. Surgical clips are seen in the right upper quadrant. Surgical staples are seen in the epigastrium. There is percutaneous left nephrostomy catheter in place. Left ureteral stent is seen. There is possible stent in a small bowel loop in the mid abdomen. There is homogeneous opacification with air bronchogram in the left lower lung fields suggesting atelectasis/pneumonia. There are faint nodular densities in the right lower lung fields suggesting pulmonary metastatic disease. IMPRESSION: Tip of enteric tube is at the gastroesophageal junction with side port in the lower thoracic esophagus. Other  findings as described in the body of the report. Electronically Signed   By: Elmer Picker M.D.   On: 02/07/2022 15:43   DG Abd Portable 1V  Result Date: 02/07/2022 CLINICAL DATA:  Confirm nasogastric tube placement. EXAM: PORTABLE ABDOMEN - 1 VIEW COMPARISON:  02/07/2022 at 5 a.m.  FINDINGS: Nasogastric tube is in place, tip overlying the level of the proximal stomach. Side port is in the region of the gastroesophageal junction. Position is similar to the prior study. Percutaneous nephrostomy and ureteral catheter appear unchanged. Duodenal stent is unchanged. No evidence for free intraperitoneal air. There is mild dilatation of small bowel loops, only partially imaged. There continues to be dense opacity of the LEFT lung base and numerous rounded masses throughout the lungs. IMPRESSION: Nasogastric tube tip overlying the level of the proximal stomach, similar in position to study. Electronically Signed   By: Nolon Nations M.D.   On: 02/07/2022 11:40   DG Abd Portable 1V  Result Date: 02/07/2022 CLINICAL DATA:  A male at age 38 presents for bowel obstruction. EXAM: PORTABLE ABDOMEN - 1 VIEW COMPARISON:  February 06, 2022. FINDINGS: LEFT-sided nephrostomy tube and nephroureteral stent remain in place. Gastric tube has been advanced since the prior study, side port likely at the level of the GE junction. Small bowel distension is diminished compared to previous imaging though remains dilated mildly in the central abdomen, potentially as much as 4.5 cm in the lower abdomen. No substantial stool visualized in the colon. Duodenal stent remaining in place similar position. On limited assessment there is no acute skeletal process. IMPRESSION: 1. Gastric tube has been advanced since the prior study, side port likely at the level of the GE junction. Consider approximally 2 cm advancement for more optimal placement. 2. Continued small bowel distension particularly in lower abdomen though diminished compared to previous imaging. Electronically Signed   By: Zetta Bills M.D.   On: 02/07/2022 07:58   DG Abd Portable 1V  Result Date: 02/06/2022 CLINICAL DATA:  Small-bowel obstruction, resection 2/14, no bowel movement EXAM: PORTABLE ABDOMEN - 1 VIEW COMPARISON:  02/05/2022 FINDINGS: Similar  appearance of gas and fluid filled, distended small bowel in the central abdomen, largest loops measuring up to 5.0 cm in caliber. Esophagogastric tube remains with tip at the level of the gastroesophageal junction, side port in the lower esophagus. Bowel stent projects at the left aspect of the lumbar spine. Left-sided percutaneous nephrostomy catheter and left-sided ureteral stent, partially imaged. No obvious free air in the abdomen on single supine radiograph. IMPRESSION: 1. Similar appearance of gas and fluid filled, distended small bowel in the central abdomen, largest loops measuring up to 5.0 cm in caliber. Findings are consistent with ileus or obstruction. 2. Esophagogastric tube remains with tip at the level of the gastroesophageal junction, side port in the lower esophagus. Electronically Signed   By: Delanna Ahmadi M.D.   On: 02/06/2022 10:07    Anti-infectives: Anti-infectives (From admission, onward)    Start     Dose/Rate Route Frequency Ordered Stop   02/04/22 1545  piperacillin-tazobactam (ZOSYN) IVPB 3.375 g        3.375 g 12.5 mL/hr over 240 Minutes Intravenous Every 8 hours 02/04/22 1453 02/09/22 1359   02/01/22 2200  ceFAZolin (ANCEF) IVPB 1 g/50 mL premix  Status:  Discontinued        1 g 100 mL/hr over 30 Minutes Intravenous Every 8 hours 02/01/22 1504 02/04/22 1453   02/01/22 0930  cefoTEtan (CEFOTAN) 2 g  in sodium chloride 0.9 % 100 mL IVPB        2 g 200 mL/hr over 30 Minutes Intravenous On call to O.R. 01/31/22 1908 02/01/22 1533   01/30/22 1000  cefTRIAXone (ROCEPHIN) 1 g in sodium chloride 0.9 % 100 mL IVPB  Status:  Discontinued        1 g 200 mL/hr over 30 Minutes Intravenous Every 24 hours 01/29/22 0013 02/01/22 1504   01/28/22 1830  cefTRIAXone (ROCEPHIN) 1 g in sodium chloride 0.9 % 100 mL IVPB        1 g 200 mL/hr over 30 Minutes Intravenous  Once 01/28/22 1828 01/28/22 1927        Assessment/Plan Stage IV pancreatic cancer with psbo  - POD7 s/p Lap LOA,  Small bowel resection, excisional biopsy of mesentery mass by Dr. 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  - CT scan 2/21 without intra abdominal abscess or obstruction. Some free fluid in the pelvis and RUQ postoperatively. Enlarging left pleural effusion and new right pleural effusion. Do not think something intraabdominal is driving his leukocytosis - NGT with minimal output. Stool in colostomy. Clamp NGT and start CLD - continue TPN, LFTs elevated in setting of TPN - hypokalemia - repletion has been ordered - PT/OT - recc snf with possible progression to [facility]  FEN: Clamp NGT and start CLD. TPN ID: cefotetan periop, ancef (UTI), zosyn 2/17>> VTE: lovenox  E coli UTI CKD 3A HTN   LOS: 10 days   Winferd Humphrey, [facility] 02/08/2022, 8:29 AM Please see Amion for pager number during day hours 7:00am-4:30pm

## 2022-02-08 NOTE — Progress Notes (Signed)
PROGRESS NOTE [place]  AOZ:[id number] DOB: 1941-01-29 DOA: 01/28/2022 PCP: Dettinger, [name], MD   Brief Narrative/Hospital Course: 81 year old male with stage IV pancreatic cancer status post colectomy, hydronephrosis status post left nephrostomy, hypertension, SBO, CKD stage III who presented with abdominal pain, found to have small bowel obstruction status post diagnostic laparoscopy with lysis of adhesion on 2/40 measuring NG suction n.p.o. while waiting for return of bowel function.  Followed by surgery oncology.  Hospital course complicated by coffee-ground colored secretion from NG tube, leukocytosis. -Repeat CT scan 2/20 no bowel obstruction, no abscess moderate amount of free fluid in the right upper quadrant, body wall edema noted to the right anterior wall. With persistent leukocytosis but afebrile, being managed with TPN, IV antibiotics.    Subjective: Seen this am On RA. Appears weak, wife on phone NGT on, port in place, colostomy with stool He reports he feels better. No nausea or vomiting and pain stable. Alert, awake oriented to president. He was talking abt [facility].  Assessment and Plan: * Partial small bowel obstruction (HCC)- (present on admission) Stage IV pancreatic cancer with partial small bowel obstruction: S/p LOA, small bowel resection, excisional biopsy of mesenteric mass Dr. [name] on 2/14.  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 CT scan 2/21 without intra-abdominal abscess or obstruction, enlarging left pleural effusion and new right pleural effusion. Being managed by surgery on board appreciate input continue NG tube plan, patient having stool output in colostomy noted plan for clear liquid diet.  Continue empiric Zosyn, TPN count and temperature curve.  Hydronephrosis of left kidney- (present on admission) Due to tumor compression, status post left nephrostomy last changed on 1/30.  Discussed  with urology previously recommended renal ultrasound on 2/17 that showed chronic right hydronephrosis with atrophic right kidney advise outpatient follow-up, seen on consult by Dr. Hervey Ard 2/18  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)M1  with metastatic disease- (present on admission) See #1.Oncology on board  E-coli UTI Urine culture with pansensitive E. coli.  Continue antibiotics.  Colostomy in place [facility]) Having output.  Stage 3a chronic kidney disease (CKD) ([place])- (present on admission) Renal function overall stable.  Level fluctuating avoid nephrotoxic medication, monitor Recent Labs  Lab 02/04/22 0531 02/05/22 0822 02/06/22 1115 02/07/22 0517 02/08/22 0606  BUN 40* 40* 45* 45* 50*  CREATININE 1.31* 1.21 1.28* 1.27* 1.39*    Essential hypertension- (present on admission) BP is controlled.Not on meds.  Leukocytosis Monitor wbc, on iv antibiotics. ?? Reactive. But has UTI and post op so on emperic antibiotics. Recent Labs  Lab 02/03/22 0523 02/04/22 0531 02/05/22 0822 02/07/22 0517 02/08/22 0606  WBC 20.9* 23.0* 22.8* 23.5* 25.7*    DVT prophylaxis: enoxaparin (LOVENOX) injection 40 mg Start: 02/02/22 1000 SCDs Start: 01/29/22 0013 Code Status:   Code Status: DNR Family Communication: plan of care discussed with patient/ wife on Phone  Disposition: Currently  not medically stable for discharge. Status is: Inpatient Remains inpatient appropriate because: ongoing management of SBO  Objective: Vitals last 24 hrs: Vitals:   02/07/22 2046 02/08/22 0353 02/08/22 0358 02/08/22 0839  BP: (!) 142/98  (!) 142/91 132/90  Pulse: 89  84 78  Resp: 16  18   Temp: 98.7 F (37.1 C)  98.7 F (37.1 C) 98.5 F (36.9 C)  TempSrc: Oral  Oral Oral  SpO2: 95%  94% 95%  Weight:  70 kg    Height:       Weight change: 2.9  kg  Physical Examination: General exam: AA0X3, ill looking mild confusion,weak,older than stated age,weak appearing. HEENT:Oral mucosa moist,  Ear/Nose WNL grossly, dentition normal. Respiratory system:Bilaterally diminished BS, no use of accessory muscle. Cardiovascular system:S1 & S2 +,No JVD. Gastrointestinal system:Abdomen soft,NT, colostomy in place with stool,BS sluggish. Nervous System:Alert, awake, moving extremities and grossly nonfocal Extremities: LE edema none,distal peripheral pulses palpable.  Skin: No rashes,no icterus. MSK: Normal muscle bulk,tone, power  Medications reviewed:  Scheduled Meds:  Chlorhexidine Gluconate Cloth  6 each Topical Daily   enoxaparin (LOVENOX) injection  40 mg Subcutaneous Q24H   insulin aspart  0-15 Units Subcutaneous Q6H   lidocaine  1 patch Transdermal Q24H   lip balm  1 application Topical BID   sodium chloride flush  3 mL Intravenous Q12H   Continuous Infusions:  sodium chloride Stopped (01/28/22 2301)   methocarbamol (ROBAXIN) IV 1,000 mg (02/08/22 0631)   piperacillin-tazobactam (ZOSYN)  IV 3.375 g (02/08/22 9485)   TPN ADULT (ION) 85 mL/hr at 02/07/22 1710   TPN ADULT (ION)        Diet Order             Diet clear liquid Room service appropriate? Yes; Fluid consistency: Thin  Diet effective now                    Nutrition Problem: Severe Malnutrition Etiology: chronic illness, cancer and cancer related treatments Signs/Symptoms: severe fat depletion, severe muscle depletion, percent weight loss Interventions: TPN   Intake/Output Summary (Last 24 hours) at 02/08/2022 1323 Last data filed at 02/08/2022 1045 Gross per 24 hour  Intake 3881.22 ml  Output 2350 ml  Net 1531.22 ml   Net IO Since Admission: 6,280.19 mL [02/08/22 1323]  Wt Readings from Last 3 Encounters:  02/08/22 70 kg  01/07/22 63.5 kg  12/31/21 63.5 kg     Unresulted Labs (From admission, onward)     Start     Ordered   02/09/22 0500  Phosphorus  Tomorrow morning,   R       Question:  Specimen collection method  Answer:  Lab=Lab collect   02/08/22 0754   02/09/22 0500  Magnesium   Tomorrow morning,   R       Question:  Specimen collection method  Answer:  Lab=Lab collect   02/08/22 0754   02/09/22 0500  Comprehensive metabolic panel  Tomorrow morning,   R       Question:  Specimen collection method  Answer:  Lab=Lab collect   02/08/22 0754   02/07/22 0500  CBC  Every Mon-Wed-Fri (0500),   R     Question:  Specimen collection method  Answer:  Lab=Lab collect   02/06/22 1432   01/31/22 0500  Comprehensive metabolic panel  (TPN Lab Panel)  Every Mon,Thu (0500),   R     Question:  Specimen collection method  Answer:  Unit=Unit collect   01/30/22 0742   01/31/22 0500  Magnesium  (TPN Lab Panel)  Every Mon,Thu (0500),   R     Question:  Specimen collection method  Answer:  Unit=Unit collect   01/30/22 0742   01/31/22 0500  Phosphorus  (TPN Lab Panel)  Every Mon,Thu (0500),   R     Question:  Specimen collection method  Answer:  Unit=Unit collect   01/30/22 0742   01/31/22 0500  Triglycerides  (TPN Lab Panel)  Every Monday (0500),   R     Question:  Specimen collection method  Answer:  Unit=Unit collect   01/30/22 0742          Data Reviewed: I have personally reviewed following labs and imaging studies CBC: Recent Labs  Lab 02/03/22 0523 02/04/22 0531 02/05/22 0822 02/07/22 0517 02/08/22 0606  WBC 20.9* 23.0* 22.8* 23.5* 25.7*  HGB 10.5* 10.8* 10.4* 10.6* 10.7*  HCT 32.2* 34.0* 34.1* 33.1* 33.0*  MCV 95.0 96.0 100.3* 93.5 93.0  PLT 378 476* 449* 541* 818*   Basic Metabolic Panel: Recent Labs  Lab 02/02/22 0627 02/03/22 0523 02/04/22 0531 02/05/22 0822 02/06/22 1115 02/07/22 0517 02/08/22 0606  NA 131* 132* 133* 135 139 140 139  K 4.7 4.2 4.0 3.3* 3.3* 3.4* 3.6  CL 102 102 102 103 107 106 105  CO2 21* 24 25 24 25 25 26   GLUCOSE 179* 142* 159* 144* 162* 193* 162*  BUN 44* 47* 40* 40* 45* 45* 50*  CREATININE 1.48* 1.50* 1.31* 1.21 1.28* 1.27* 1.39*  CALCIUM 8.4* 8.4* 8.6* 8.5* 8.4* 8.6* 8.3*  MG 1.9 2.1 1.9 2.0  --  2.2  --   PHOS 4.0 3.8 3.3   --   --  3.6  --    GFR: Estimated Creatinine Clearance: 42 mL/min (A) (by C-G formula based on SCr of 1.39 mg/dL (H)). Liver Function Tests: Recent Labs  Lab 02/02/22 0627 02/03/22 0523 02/07/22 0517 02/08/22 0606  AST 24 22 207* 142*  ALT 16 11 122* 114*  ALKPHOS 55 56 218* 245*  BILITOT 0.2* 0.2* 0.6 0.6  PROT 5.9* 5.3* 5.7* 5.4*  ALBUMIN 2.1* 1.8* 1.7* 1.6*   No results for input(s): LIPASE, AMYLASE in the last 168 hours. No results for input(s): AMMONIA in the last 168 hours. Coagulation Profile: No results for input(s): INR, PROTIME in the last 168 hours. Cardiac Enzymes: No results for input(s): CKTOTAL, CKMB, CKMBINDEX, TROPONINI in the last 168 hours. BNP (last 3 results) No results for input(s): PROBNP in the last 8760 hours. HbA1C: No results for input(s): HGBA1C in the last 72 hours. CBG: Recent Labs  Lab 02/07/22 1156 02/07/22 1800 02/07/22 2349 02/08/22 0536 02/08/22 1156  GLUCAP 158* 129* 138* 145* 150*   Lipid Profile: Recent Labs    02/07/22 0517  TRIG 119   Thyroid Function Tests: No results for input(s): TSH, T4TOTAL, FREET4, T3FREE, THYROIDAB in the last 72 hours. Anemia Panel: No results for input(s): VITAMINB12, FOLATE, FERRITIN, TIBC, IRON, RETICCTPCT in the last 72 hours. Sepsis Labs: Recent Labs  Lab 02/03/22 0523  LATICACIDVEN 1.1    Recent Results (from the past 240 hour(s))  Culture, blood (routine x 2)     Status: None (Preliminary result)   Collection Time: 02/04/22  3:42 PM   Specimen: BLOOD  Result Value Ref Range Status   Specimen Description BLOOD LEFT ANTECUBITAL  Final   Special Requests   Final    BOTTLES DRAWN AEROBIC AND ANAEROBIC Blood Culture adequate volume   Culture   Final    NO GROWTH 4 DAYS Performed at [facility], [address], [place], [place] [address]    Report Status PENDING  Incomplete  Culture, blood (routine x 2)     Status: None (Preliminary result)   Collection Time: 02/04/22  3:42  PM   Specimen: BLOOD  Result Value Ref Range Status   Specimen Description BLOOD RIGHT ANTECUBITAL  Final   Special Requests   Final    BOTTLES DRAWN AEROBIC ONLY Blood Culture results may not be optimal due to  an inadequate volume of blood received in culture bottles   Culture   Final    NO GROWTH 4 DAYS Performed at [facility], [place] [address], [place], [place] [address]    Report Status PENDING  Incomplete    Antimicrobials: Anti-infectives (From admission, onward)    Start     Dose/Rate Route Frequency Ordered Stop   02/04/22 1545  piperacillin-tazobactam (ZOSYN) IVPB 3.375 g        3.375 g 12.5 mL/hr over 240 Minutes Intravenous Every 8 hours 02/04/22 1453 02/09/22 1359   02/01/22 2200  ceFAZolin (ANCEF) IVPB 1 g/50 mL premix  Status:  Discontinued        1 g 100 mL/hr over 30 Minutes Intravenous Every 8 hours 02/01/22 1504 02/04/22 1453   02/01/22 0930  cefoTEtan (CEFOTAN) 2 g in sodium chloride 0.9 % 100 mL IVPB        2 g 200 mL/hr over 30 Minutes Intravenous On call to O.R. 01/31/22 1908 02/01/22 1533   01/30/22 1000  cefTRIAXone (ROCEPHIN) 1 g in sodium chloride 0.9 % 100 mL IVPB  Status:  Discontinued        1 g 200 mL/hr over 30 Minutes Intravenous Every 24 hours 01/29/22 0013 02/01/22 1504   01/28/22 1830  cefTRIAXone (ROCEPHIN) 1 g in sodium chloride 0.9 % 100 mL IVPB        1 g 200 mL/hr over 30 Minutes Intravenous  Once 01/28/22 1828 01/28/22 1927      Culture/Microbiology    Component Value Date/Time   SDES BLOOD LEFT ANTECUBITAL 02/04/2022 1542   SDES BLOOD RIGHT ANTECUBITAL 02/04/2022 1542   SPECREQUEST  02/04/2022 1542    BOTTLES DRAWN AEROBIC AND ANAEROBIC Blood Culture adequate volume   SPECREQUEST  02/04/2022 1542    BOTTLES DRAWN AEROBIC ONLY Blood Culture results may not be optimal due to an inadequate volume of blood received in culture bottles   CULT  02/04/2022 1542    NO GROWTH 4 DAYS Performed at [facility], [place] [address], [place], [place] [address]    CULT  02/04/2022 1542    NO GROWTH 4 DAYS Performed at [facility], [place] [address], [place], [place] [address]    REPTSTATUS PENDING 02/04/2022 1542   REPTSTATUS PENDING 02/04/2022 1542  Radiology Studies: CT ABDOMEN PELVIS WO CONTRAST  Result Date: 02/08/2022 CLINICAL DATA:  Abdominal pain. Postop, assess for intra-abdominal abscess. Patient is post small bowel resection and excisional biopsy of mesenteric mass on 02/01/2022 EXAM: CT ABDOMEN AND PELVIS WITHOUT CONTRAST TECHNIQUE: Multidetector CT imaging of the abdomen and pelvis was performed following the standard protocol without IV contrast. Patient declined IV contrast administration. RADIATION DOSE REDUCTION: This exam was performed according to the departmental dose-optimization program which includes automated exposure control, adjustment of the mA and/or kV according to patient size and/or use of iterative reconstruction technique. COMPARISON:  Preoperative CT 01/28/2022 FINDINGS: Lower chest: Pulmonary metastatic disease in the lung bases with multiple pulmonary nodules. Enlarging left pleural effusion with pleural thickening, possibly metastatic. Increasing compressive atelectasis in the included left lung base. Moderate-sized right pleural effusion is new. Hepatobiliary: Scattered water density lesions in the liver are unchanged from prior exam likely cysts cholecystectomy. Common bile duct is difficult defined, there is no visualized biliary dilatation. Pancreas: The pancreas is difficult to delineate on the current exam, electronic records states distal pancreatectomy. Spleen: Surgically absent. Adrenals/Urinary Tract: No adrenal nodule. There is right hydroureteronephrosis with the  ureter being dilated to the pelvis, the previous hyperattenuating focus in the distal right ureter tentatively visualized series 2, image 75. Marked right renal atrophy. Left nephrostomy and left nephroureteral stent in place.  There is no left hydronephrosis. The urinary bladder is poorly defined, appears thick walled. There is air in the bladder. Stomach/Bowel: Detailed bowel assessment is limited given paucity of intra-abdominal fat and motion. Enteric tube terminates in the gastroesophageal junction. The stomach is decompressed. A duodenal stent is unchanged in position, patent. Descending colostomy with Hartmann's pouch. Small bowel is mildly prominent but nonobstructed, and enteric contrast is seen throughout the colon. Bowel loops in the pelvis are difficult to define, there is fluid and stool within the and set to sigmoid colon. Vascular/Lymphatic: Aortic atherosclerosis without aneurysm. Limited assessment for adenopathy given lack of IV contrast and motion artifact. Reproductive: Prostate is unremarkable. Other: There is a small amount of non organized free fluid in the pelvis. Moderate right upper quadrant ascites. Generalized edema of the intra-abdominal and mesenteric fat. There is no evidence of focal fluid collection/abscess. No free air. Diffuse subcutaneous edema. Soft tissue nodule in the right anterior abdominal wall. Musculoskeletal: No focal bone lesion. Degenerative change in the spine and hips. IMPRESSION: 1. No evidence of postoperative intra-abdominal abscess. Moderate right upper quadrant free fluid and small amount of free fluid in the pelvis is non organized. 2. Enteric tube terminates at the gastroesophageal junction. 3. Left nephrostomy and left nephroureteral stent in place without left hydronephrosis. Chronic right renal atrophy and hydroureteronephrosis. 4. Pulmonary metastatic disease in the lung bases with multiple pulmonary nodules. Enlarging left pleural effusion with pleural thickening, possibly metastatic. Moderate-sized right pleural effusion is new. 5. Diffuse body wall edema. 6. Soft tissue nodule in the right anterior abdominal wall is unchanged, possibly metastatic. 7. Additional chronic findings  are unchanged. Aortic Atherosclerosis (ICD10-I70.0). Electronically Signed   By: Keith Rake M.D.   On: 02/08/2022 00:13   DG Abd Portable 1V  Result Date: 02/07/2022 CLINICAL DATA:  NG tube placement EXAM: PORTABLE ABDOMEN - 1 VIEW COMPARISON:  Previous studies including the examination done earlier today FINDINGS: Tip enteric tube is noted at the gastroesophageal junction. There is cephalad migration of the tip of enteric tube since the previous examination. Side port in the enteric tube is noted in the lower thoracic esophagus. Bowel gas pattern is nonspecific. Surgical clips are seen in the right upper quadrant. Surgical staples are seen in the epigastrium. There is percutaneous left nephrostomy catheter in place. Left ureteral stent is seen. There is possible stent in a small bowel loop in the mid abdomen. There is homogeneous opacification with air bronchogram in the left lower lung fields suggesting atelectasis/pneumonia. There are faint nodular densities in the right lower lung fields suggesting pulmonary metastatic disease. IMPRESSION: Tip of enteric tube is at the gastroesophageal junction with side port in the lower thoracic esophagus. Other findings as described in the body of the report. Electronically Signed   By: Elmer Picker M.D.   On: 02/07/2022 15:43   DG Abd Portable 1V  Result Date: 02/07/2022 CLINICAL DATA:  Confirm nasogastric tube placement. EXAM: PORTABLE ABDOMEN - 1 VIEW COMPARISON:  02/07/2022 at 5 a.m. FINDINGS: Nasogastric tube is in place, tip overlying the level of the proximal stomach. Side port is in the region of the gastroesophageal junction. Position is similar to the prior study. Percutaneous nephrostomy and ureteral catheter appear unchanged. Duodenal stent is unchanged. No evidence for free intraperitoneal air. There is mild  dilatation of small bowel loops, only partially imaged. There continues to be dense opacity of the LEFT lung base and numerous rounded  masses throughout the lungs. IMPRESSION: Nasogastric tube tip overlying the level of the proximal stomach, similar in position to study. Electronically Signed   By: Nolon Nations M.D.   On: 02/07/2022 11:40   DG Abd Portable 1V  Result Date: 02/07/2022 CLINICAL DATA:  A male at age 55 presents for bowel obstruction. EXAM: PORTABLE ABDOMEN - 1 VIEW COMPARISON:  February 06, 2022. FINDINGS: LEFT-sided nephrostomy tube and nephroureteral stent remain in place. Gastric tube has been advanced since the prior study, side port likely at the level of the GE junction. Small bowel distension is diminished compared to previous imaging though remains dilated mildly in the central abdomen, potentially as much as 4.5 cm in the lower abdomen. No substantial stool visualized in the colon. Duodenal stent remaining in place similar position. On limited assessment there is no acute skeletal process. IMPRESSION: 1. Gastric tube has been advanced since the prior study, side port likely at the level of the GE junction. Consider approximally 2 cm advancement for more optimal placement. 2. Continued small bowel distension particularly in lower abdomen though diminished compared to previous imaging. Electronically Signed   By: Zetta Bills M.D.   On: 02/07/2022 07:58     LOS: 10 days   Antonieta Pert, MD [facility]  02/08/2022, 1:23 PM

## 2022-02-08 NOTE — Assessment & Plan Note (Addendum)
Transition to comfort care.  Dr Marin Olp assisted with management.

## 2022-02-08 NOTE — Assessment & Plan Note (Addendum)
Due to tumor compression, s/p left nephrostomy last changed on 1/30, renal ultrasound on 2/17 -chronic right hydronephrosis with atrophic right kidney, urology advised outpatient follow-up.  Nephrostomy draining well

## 2022-02-08 NOTE — Hospital Course (Addendum)
81 year old male with stage IV pancreatic cancer status post distal pancreatectomy in 2018 at [facility], status post diverting colostomy for colonic obstruction 2022, hydronephrosis status post left nephrostomy, hypertension, SBO, CKD stage III who presented with abdominal pain, found to have small bowel obstruction status post diagnostic laparoscopy with lysis of adhesion on 2/4.  He was treated aggressively with IV antibiotics, TPN, subsequently diagnosed with empyema with Enterococcus, received pigtail drainage with lytics, he also developed ex-vacuo  pneumothorax status post pigtail, likely represents trapped lung.  He was evaluated by ID, he was treated with IV antibiotics.  He was also diagnosed with lower extremity DVT.  Treated with heparin drip.  Patient developed recurrence of SBO, x-ray showed possible partial small bowel obstruction.  Dr. Marin Olp assisted with patient care and subsequently recommended patient to transition to comfort care. Palliative care meeting with patient and family plan was to transition to comfort care residential hospice in [place]. Plan to transfer to residential Hospice.

## 2022-02-08 NOTE — Assessment & Plan Note (Addendum)
Stage IV pancreatic cancer with partial SBO s/p  Surgery CT 2/25 with small abscess in the abdominal wall Persistent abdominal pain distention nausea vomiting: S/p LOA, SBR,biopsy 2/14- positive for ACC with mucinous features of left colon mesentery and jejunum. CT abdomen 2/25-small abscess in the abdominal wall-on wound care. Treated with IV antibiotics, TPN. Develops recurrence SBO.  Transition to comfort care.

## 2022-02-08 NOTE — Assessment & Plan Note (Addendum)
AKI with creatinine at 1.59 improved to 1.1-1.2 with ivf, tpn.  Trend Recent Labs  Lab 02/09/22 0610 02/10/22 0812 02/12/22 0551 02/13/22 0623 02/14/22 0713  BUN 50* 51*   50* 55* 52* 46*  CREATININE 1.50* 1.23   1.20 1.19 1.24 1.15

## 2022-02-08 NOTE — Assessment & Plan Note (Addendum)
See #1 °

## 2022-02-08 NOTE — Progress Notes (Signed)
Daily Progress Note   Patient Name: Kevin Villegas       Date: 02/08/2022 DOB: [birth year]  Age: 81 y.o. MRN#: [id number] Attending Physician: Antonieta Pert, MD Primary Care Physician: Dettinger, [name], MD Admit Date: 01/28/2022  Reason for Consultation/Follow-up: Establishing goals of care  Subjective: I saw and examined Kevin Villegas.  He was lying in bed in no distress.  Reports that he has been trying to see how things go in regard to full liquid diet and feels his tolerance is improving.  Denies any other needs at this time.  Length of Stay: 10  Current Medications: Scheduled Meds:   Chlorhexidine Gluconate Cloth  6 each Topical Daily   enoxaparin (LOVENOX) injection  40 mg Subcutaneous Q24H   insulin aspart  0-15 Units Subcutaneous Q6H   lidocaine  1 patch Transdermal Q24H   lip balm  1 application Topical BID   sodium chloride flush  3 mL Intravenous Q12H    Continuous Infusions:  sodium chloride Stopped (01/28/22 2301)   methocarbamol (ROBAXIN) IV 1,000 mg (02/08/22 1421)   piperacillin-tazobactam (ZOSYN)  IV 3.375 g (02/08/22 1420)   TPN ADULT (ION) 85 mL/hr at 02/08/22 1708    PRN Meds: sodium chloride, alum & mag hydroxide-simeth, antiseptic oral rinse, diphenhydrAMINE, fentaNYL (SUBLIMAZE) injection, iohexol, menthol-cetylpyridinium, phenol, prochlorperazine, simethicone, sodium chloride flush  Physical Exam         Appears chronically ill.  Resting in bed Has NGT but not to suction No edema Awake alert S 1 S 2   Vital Signs: BP 133/81 (BP Location: Right Arm)    Pulse 83    Temp 98.4 F (36.9 C) (Oral)    Resp 18    Ht 5\' 11"  (1.803 m)    Wt 70 kg    SpO2 95%    BMI 21.52 kg/m  SpO2: SpO2: 95 % O2 Device: O2 Device: Room Air O2 Flow Rate: O2 Flow Rate (L/min):  2 L/min  Intake/output summary:  Intake/Output Summary (Last 24 hours) at 02/08/2022 1844 Last data filed at 02/08/2022 1807 Gross per 24 hour  Intake 1334.79 ml  Output 2900 ml  Net -1565.21 ml    LBM: Last BM Date : 02/07/22 Baseline Weight: Weight: 57 kg Most recent weight: Weight: 70 kg       Palliative Assessment/Data:  Patient Active Problem List   Diagnosis Date Noted   E-coli UTI 02/01/2022   SBO (small bowel obstruction) ([place]) 01/28/2022   Subcutaneous nodule of abdominal wall 01/09/2022   Partial small bowel obstruction ([place]) 01/07/2022   COVID-19 virus infection 01/07/2022   Colostomy in place [facility]) 11/10/2021   Stage 3a chronic kidney disease (CKD) ([place]) 11/10/2021   Pyelonephritis    Essential hypertension 10/30/2021   Hydronephrosis of left kidney 10/28/2021   Ureteral stricture, left 10/27/2021   Protein-calorie malnutrition, severe 06/12/2021   Hydroureter on right 06/10/2021   Aortic atherosclerosis ([place]) 06/10/2021   Leukocytosis 06/02/2021   Right ureteral stone 06/02/2021   Colon stricture ([place])    Pancreatic mass 06/27/2019   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)M1  with metastatic disease 06/27/2019   Rhinitis medicamentosa 02/26/2019    Palliative Care Assessment & Plan   Patient Profile:    Assessment:  81 year old gentleman who lives at home with his wife in [place], [place].  His daughter lives nearby.  He has a past medical history of stage IV pancreatic cancer, status post colectomy, hydronephrosis status post nephrostomy, underlying hypertension small bowel obstruction stage II chronic kidney disease.  Patient presented with ongoing abdominal pain, known to have concerns for small bowel obstruction issues.  Underwent a diagnostic laparoscopy with lysis of additions on 2-14.  He is being followed by general surgery and medical oncology.  Has been placed on TPN for nutritional support.  Has been placed on NG tube for  suction.  So far, patient has not not had any bowel movements or passed flatus. Palliative medicine team has been consulted for CODE STATUS and goals of care discussions and for additional support.  Recommendations/Plan: DNR Continue current mode of care: patient and family hopeful for tolerance/progression of diet.  He reports talking this morning with Dr. Marin Olp and understands need to improve both nutrition and functional status before there could even be consideration for systemic therapy for his cancer.  Code Status:    Code Status Orders  (From admission, onward)           Start     Ordered   02/06/22 1335  Do not attempt resuscitation (DNR)  Continuous       Question Answer Comment  In the event of cardiac or respiratory ARREST Do not call a code blue   In the event of cardiac or respiratory ARREST Do not perform Intubation, CPR, defibrillation or ACLS   In the event of cardiac or respiratory ARREST Use medication by any route, position, wound care, and other measures to relive pain and suffering. May use oxygen, suction and manual treatment of airway obstruction as needed for comfort.      02/06/22 1334           Code Status History     Date Active Date Inactive Code Status Order ID Comments User Context   01/29/2022 0014 02/06/2022 1334 Full Code [id number]  Marcelyn Bruins, MD Inpatient   01/07/2022 2129 01/10/2022 2258 Full Code [id number]  Rise Patience, MD Inpatient   12/10/2021 1331 12/11/2021 2137 Full Code [id number]  Jonnie Finner, DO ED   11/10/2021 0354 11/13/2021 2039 Full Code [id number]  Chotiner, Yevonne Aline, MD Inpatient   10/27/2021 2317 10/30/2021 1833 Full Code [id number]  Lenore Cordia, MD Inpatient   06/09/2021 2201 06/18/2021 1923 Full Code [id number]  Lenore Cordia, MD Inpatient   06/01/2021 2102 06/05/2021 1830 Full Code [id number]  Howerter,  Ethelda Chick, DO ED   05/18/2021 1200 05/26/2021 1958 Partial Code [id number]  Jonnie Finner, DO Inpatient    12/12/2014 1730 12/17/2014 1704 Full Code [id number]  Velvet Bathe, MD Inpatient       Prognosis:  Guarded   Discharge Planning: To Be Determined  Care plan was discussed with patient.  Thank you for allowing the Palliative Medicine Team to assist in the care of this patient.   Time In: 1600 Time Out: 1630 Total Time 30 Prolonged Time Billed  no       Greater than 50%  of this time was spent counseling and coordinating care related to the above assessment and plan.  Micheline Rough, MD  Please contact Palliative Medicine Team phone at [phone] for questions and concerns.

## 2022-02-08 NOTE — Progress Notes (Signed)
PHARMACY - TOTAL PARENTERAL NUTRITION CONSULT NOTE   Indication: Small bowel obstruction and inability to meet nutritional needs w/ PO intake  Patient Measurements: Height: 5' 11"  (180.3 cm) Weight: 70 kg (154 lb 5.2 oz) IBW/kg (Calculated) : 75.3 TPN AdjBW (KG): 57.7 Body mass index is 21.52 kg/m. Usual Weight: Weight noted in November 2022 to be 63.5 kg  Assessment: 81 yo male presenting to [facility] ED for abdominal pain, nausea, vomiting. PMH includes pancreatic cancer with evidence of metastatic disease to his bladder, lungs, and pleural space and history of SBO.  S/p stent placement between stomach and intestines and colostomy placement in June 2022. Pharmacy is consulted to begin TPN for possible bridge to surgery given low pre-albumin and inability to meet nutritional needs w/ PO intake.   Glucose / Insulin: No hx DM;  - mSS q6h (used 11 units in 24 hrs) - cbgs (goal <150): most <150 Electrolytes: K 3.6, CorrCa 10.22; CL and CO2 wnl - Phos and Mag on 2/20 ok Renal: SCr up 1.39 (crcl~42); BUN elevated 50 Hepatic: AST/ALT down 142/114, Alk phos elevated; Tbili wnl -  Pre-albumin low at 8.2 (2/11); albumin low at 1.7. TG 119 (2/20) Intake / Output; MIVF:  - I/O: +1906 ml - UOP: 1675 ml - NG: 300 ml GI Imaging: - 2/10 CT abdomen pelvis: re-demonstration of high-grade partial SBO with transition point in the upper left pelvis psych likely due to adhesion.  - 2/20 abd CT: No evidence of postoperative intra-abdominal abscess. BL pleural effusions. Pulmonary metastatic disease in the lung bases with multiple pulmonary nodules. GI Surgeries / Procedures:  - 2/14: laparoscopic lysis of adhesions, small bowel resection, excisional bipsy of mesentery mass   Central access: PAC TPN start date: 2/12  Nutritional Goals: Goal TPN rate is 85 mL/hr (provides 112g of protein and 2061 kcals per day)  RD Assessment:  Estimated Needs Total Energy Estimated Needs: 2050-2250 kcal Total Protein  Estimated Needs: 105-120 grams Total Fluid Estimated Needs: >/= 2 L/day  Current Nutrition:  NPO except ice chips  TPN  Plan:   Now:  - KCl 10 mEq IV x 2 runs  At 1800: - Continue TPN at goal rate of 62m/hr - Electrolytes in TPN:  Na decrease to 1051m/L K increase to 3053mL Ca 2mE55m Mg 10mE39mPhos 15mmo67mCl:Ac 1:1 - Add standard trace elements daily to TPN. Add MVI to TPN on M/W/F only due to national shortage.  - continue  moderate SSI q6h and adjust as needed. - TPN labs on Mon/Thurs - Cmet, phos and mag on 2/22   Rola Lennon PhDia SittermD, BCPS 02/08/2022 7:40 AM

## 2022-02-08 NOTE — TOC Progression Note (Signed)
Transition of Care [facility]) - Progression Note    Patient Details  Name: Velma Hanna MRN: [id number] Date of Birth: October 23, 1941  [facility]) CM/SW Contact  Beyounce Dickens, Marjie Skiff, RN Phone Number: 02/08/2022, 2:45 PM  Clinical Narrative:    Met with pt at bedside for dc planning. Pt informed that physical therapy recommendation is for SNF at dc. Pt states that he is open to SNF if needed. Pt encouraged to ambulate with staff as much as possible and to work hard with physical therapy while in the hospital. Anticipate pt will be in the hospital for a while as he is not close to being medically ready for dc. TOC will continue to follow.   Expected Discharge Plan: Skilled Nursing Facility Barriers to Discharge: Continued Medical Work up  Expected Discharge Plan and Services Expected Discharge Plan: [place]   Discharge Planning Services: CM Consult     Readmission Risk Interventions Readmission Risk Prevention Plan 02/03/2022 06/16/2021 06/11/2021  Transportation Screening Complete Complete Complete  PCP or Specialist Appt within 3-5 Days - - Complete  HRI or Home Care Consult - - Complete  Social Work Consult for [place] Planning/Counseling - - Complete  Palliative Care Screening - - Not Applicable  Medication Review Press photographer) Complete Complete Complete  PCP or Specialist appointment within 3-5 days of discharge Complete Complete -  Short or Home Care Consult Complete Complete -  SW Recovery Care/Counseling Consult Complete Complete -  Palliative Care Screening Not Applicable Not Applicable -  [place] Not Applicable Not Applicable -  Some recent data might be hidden

## 2022-02-08 NOTE — Assessment & Plan Note (Addendum)
Leukocytosis Thrombocytosis: Peaked to 38k-trended down to 18 K but now uptrending, afebrile.  Also has thrombocytosis which is most likely reactive.  On Zyvox.  Leukocytosis is  multifactorial due to patient's empyema and abdominal wall abscess. Recent Labs  Lab 02/11/22 0607 02/12/22 1039 02/13/22 0623 02/14/22 0713 02/15/22 0357  WBC 35.5* 31.2* 24.3* 18.4* 20.2*

## 2022-02-09 ENCOUNTER — Inpatient Hospital Stay (HOSPITAL_COMMUNITY): Payer: Medicare Other

## 2022-02-09 DIAGNOSIS — Z7189 Other specified counseling: Secondary | ICD-10-CM | POA: Diagnosis not present

## 2022-02-09 DIAGNOSIS — C252 Malignant neoplasm of tail of pancreas: Secondary | ICD-10-CM | POA: Diagnosis not present

## 2022-02-09 DIAGNOSIS — Z933 Colostomy status: Secondary | ICD-10-CM | POA: Diagnosis not present

## 2022-02-09 DIAGNOSIS — K56600 Partial intestinal obstruction, unspecified as to cause: Secondary | ICD-10-CM | POA: Diagnosis not present

## 2022-02-09 LAB — CBC
HCT: 34.1 % — ABNORMAL LOW (ref 39.0–52.0)
Hemoglobin: 11 g/dL — ABNORMAL LOW (ref 13.0–17.0)
MCH: 30.2 pg (ref 26.0–34.0)
MCHC: 32.3 g/dL (ref 30.0–36.0)
MCV: 93.7 fL (ref 80.0–100.0)
Platelets: 642 10*3/uL — ABNORMAL HIGH (ref 150–400)
RBC: 3.64 MIL/uL — ABNORMAL LOW (ref 4.22–5.81)
RDW: 17 % — ABNORMAL HIGH (ref 11.5–15.5)
WBC: 29.9 10*3/uL — ABNORMAL HIGH (ref 4.0–10.5)
nRBC: 0.1 % (ref 0.0–0.2)

## 2022-02-09 LAB — URINALYSIS, ROUTINE W REFLEX MICROSCOPIC
Bilirubin Urine: NEGATIVE
Glucose, UA: NEGATIVE mg/dL
Ketones, ur: NEGATIVE mg/dL
Nitrite: NEGATIVE
Protein, ur: 30 mg/dL — AB
Specific Gravity, Urine: 1.023 (ref 1.005–1.030)
pH: 5 (ref 5.0–8.0)

## 2022-02-09 LAB — CULTURE, BLOOD (ROUTINE X 2)
Culture: NO GROWTH
Culture: NO GROWTH
Special Requests: ADEQUATE

## 2022-02-09 LAB — GLUCOSE, CAPILLARY
Glucose-Capillary: 165 mg/dL — ABNORMAL HIGH (ref 70–99)
Glucose-Capillary: 169 mg/dL — ABNORMAL HIGH (ref 70–99)
Glucose-Capillary: 122 mg/dL — ABNORMAL HIGH (ref 70–99)
Glucose-Capillary: 168 mg/dL — ABNORMAL HIGH (ref 70–99)

## 2022-02-09 LAB — COMPREHENSIVE METABOLIC PANEL
ALT: 94 U/L — ABNORMAL HIGH (ref 0–44)
AST: 108 U/L — ABNORMAL HIGH (ref 15–41)
Albumin: 1.5 g/dL — ABNORMAL LOW (ref 3.5–5.0)
Alkaline Phosphatase: 207 U/L — ABNORMAL HIGH (ref 38–126)
Anion gap: 8 (ref 5–15)
BUN: 50 mg/dL — ABNORMAL HIGH (ref 8–23)
CO2: 25 mmol/L (ref 22–32)
Calcium: 8.3 mg/dL — ABNORMAL LOW (ref 8.9–10.3)
Chloride: 107 mmol/L (ref 98–111)
Creatinine, Ser: 1.5 mg/dL — ABNORMAL HIGH (ref 0.61–1.24)
GFR, Estimated: 47 mL/min — ABNORMAL LOW (ref >60)
Glucose, Bld: 173 mg/dL — ABNORMAL HIGH (ref 70–99)
Potassium: 3.6 mmol/L (ref 3.5–5.1)
Sodium: 140 mmol/L (ref 135–145)
Total Bilirubin: 0.8 mg/dL (ref 0.3–1.2)
Total Protein: 5.4 g/dL — ABNORMAL LOW (ref 6.5–8.1)

## 2022-02-09 LAB — PHOSPHORUS: Phosphorus: 4 mg/dL (ref 2.5–4.6)

## 2022-02-09 LAB — MAGNESIUM: Magnesium: 2.3 mg/dL (ref 1.7–2.4)

## 2022-02-09 MED ORDER — TRAVASOL 10 % IV SOLN
INTRAVENOUS | Status: AC
  Filled 2022-02-09: qty 1122

## 2022-02-09 MED ORDER — BOOST / RESOURCE BREEZE PO LIQD CUSTOM
1.0000 | Freq: Three times a day (TID) | ORAL | Status: DC
  Administered 2022-02-09 – 2022-02-15 (×6): 1 via ORAL

## 2022-02-09 MED ORDER — SODIUM CHLORIDE 0.9 % IV SOLN
INTRAVENOUS | Status: DC
Start: 2022-02-09 — End: 2022-02-10

## 2022-02-09 NOTE — Progress Notes (Signed)
PROGRESS NOTE Kevin Villegas  STM:[id number] DOB: [birth year] DOA: 01/28/2022 PCP: Dettinger, [name], MD   Brief Narrative/Hospital Course: 81 year old male with stage IV pancreatic cancer status post colectomy, hydronephrosis status post left nephrostomy, hypertension, SBO, CKD stage III who presented with abdominal pain, found to have small bowel obstruction status post diagnostic laparoscopy with lysis of adhesion on 2/40 measuring NG suction n.p.o. while waiting for return of bowel function.  Followed by surgery oncology.  Hospital course complicated by coffee-ground colored secretion from NG tube, leukocytosis. -Repeat CT scan 2/20 no bowel obstruction, no abscess moderate amount of free fluid in the right upper quadrant, body wall edema noted to the right anterior wall. With persistent leukocytosis but afebrile, being managed with TPN, IV antibiotics.    Subjective:  See examined this morning.  Patient is much more alert awake and oriented this morning. Overnight no fever but his WBC count continues to trend up, creatinine worsening this morning AST ALT remains up .Tmax 99.  Nephrostomy output left 1875 unmeasured urine 2 times off NG tube  Assessment and Plan: * Partial small bowel obstruction (HCC)- (present on admission) Stage IV pancreatic cancer with partial small bowel obstruction: S/p LOA, small bowel resection, excisional biopsy of mesenteric mass Dr. [name] on 2/14.  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 CT scan 2/21 without intra-abdominal abscess or obstruction, enlarging left pleural effusion and new right pleural effusion. Being managed by surgery on board.  Off NG tube 2/21, tolerating clears having colostomy output, advancing diet as tolerated as per surgery, on TPN and slowly wean.  Continue PT OT and ambulation will need a skilled nursing facility VS hh Continue empiric Zosyn ( 2/17>>), has persistent  leukocytosis   Hydronephrosis of left kidney- (present on admission) Due to tumor compression, status post left nephrostomy last changed on 1/30.  Discussed with urology previously recommended renal ultrasound on 2/17 that showed chronic right hydronephrosis with atrophic right kidney advise outpatient follow-up, seen on consult by urology 2/18  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)M1  with metastatic disease- (present on admission) See #1.Oncology on board  E-coli UTI Urine culture with pansensitive E. Coli.Continue antibiotics.  Colostomy in place [facility]) Having output.  Stage 3a chronic kidney disease (CKD) ([place])- (present on admission) Renal function overall stable only 1.2-1.5 slightly uptrending, add gentle IV fluids, on TPN and tolerating p.o Recent Labs  Lab 02/05/22 0822 02/06/22 1115 02/07/22 0517 02/08/22 0606 02/09/22 0610  BUN 40* 45* 45* 50* 50*  CREATININE 1.21 1.28* 1.27* 1.39* 1.50*    Essential hypertension- (present on admission) BP stable.Not on meds.  Leukocytosis Leukocytosis further worsening but clinically stable improving, afebrile.  Unclear etiology,?? Reactive. But has UTI and post op so on emperic antibiotics.  Will do chest x-ray today/check UA.  Keep on empiric Zosyn Recent Labs  Lab 02/04/22 0531 02/05/22 0822 02/07/22 0517 02/08/22 0606 02/09/22 0610  WBC 23.0* 22.8* 23.5* 25.7* 29.9*    DVT prophylaxis: enoxaparin (LOVENOX) injection 40 mg Start: 02/02/22 1000 SCDs Start: 01/29/22 0013 Code Status:   Code Status: DNR Family Communication: plan of care discussed with patient/ wife on Phone 2/21  Disposition: Currently  not medically stable for discharge. Status is: Inpatient Remains inpatient appropriate because: ongoing management of SBO  Objective: Vitals last 24 hrs: Vitals:   02/08/22 2010 02/09/22 0545 02/09/22 0545 02/09/22 1002  BP: 140/90  133/90 116/83  Pulse: 90  77 73  Resp: 18  18 17  Temp: 99 F (37.2 C)   (!) 97.5 F (36.4 C) 97.8 F (36.6 C)  TempSrc: Oral  Oral Oral  SpO2: 92%  93% 93%  Weight:  69.8 kg    Height:       Weight change: -0.2 kg  Physical Examination: General exam: AA0X3, ill looking, frail, older than stated age, weak appearing. HEENT:Oral mucosa dry,Ear/Nose WNL grossly, dentition normal. Respiratory system: bilaterally diminished at base, no use of accessory muscle Cardiovascular system: S1 & S2 +, No JVD,. Gastrointestinal system: Abdomen soft, mild tenderness,ND,BS+ Nervous System:Alert, awake, moving extremities and grossly nonfocal Extremities: LE ankle edema NONE, distal peripheral pulses palpable.  Skin: No rashes,no icterus. MSK: Normal muscle bulk,tone, power   Medications reviewed:  Scheduled Meds:  Chlorhexidine Gluconate Cloth  6 each Topical Daily   enoxaparin (LOVENOX) injection  40 mg Subcutaneous Q24H   feeding supplement  1 Container Oral TID BM   insulin aspart  0-15 Units Subcutaneous Q6H   lidocaine  1 patch Transdermal Q24H   lip balm  1 application Topical BID   sodium chloride flush  3 mL Intravenous Q12H   Continuous Infusions:  sodium chloride Stopped (01/28/22 2301)   methocarbamol (ROBAXIN) IV 1,000 mg (02/09/22 0606)   piperacillin-tazobactam (ZOSYN)  IV 3.375 g (02/09/22 0605)   TPN ADULT (ION) 85 mL/hr at 02/08/22 1708   TPN ADULT (ION)        Diet Order             Diet clear liquid Room service appropriate? Yes; Fluid consistency: Thin  Diet effective now                    Nutrition Problem: Severe Malnutrition Etiology: chronic illness, cancer and cancer related treatments Signs/Symptoms: severe fat depletion, severe muscle depletion, percent weight loss Interventions: TPN   Intake/Output Summary (Last 24 hours) at 02/09/2022 1140 Last data filed at 02/09/2022 1101 Gross per 24 hour  Intake 355 ml  Output 2025 ml  Net -1670 ml   Net IO Since Admission: 4,510.19 mL [02/09/22 1140]  Wt Readings from Last  3 Encounters:  02/09/22 69.8 kg  01/07/22 63.5 kg  12/31/21 63.5 kg     Unresulted Labs (From admission, onward)     Start     Ordered   02/07/22 0500  CBC  Every Mon-Wed-Fri (0500),   R     Question:  Specimen collection method  Answer:  Lab=Lab collect   02/06/22 1432   01/31/22 0500  Comprehensive metabolic panel  (TPN Lab Panel)  Every Mon,Thu (0500),   R     Question:  Specimen collection method  Answer:  Unit=Unit collect   01/30/22 0742   01/31/22 0500  Magnesium  (TPN Lab Panel)  Every Mon,Thu (0500),   R     Question:  Specimen collection method  Answer:  Unit=Unit collect   01/30/22 0742   01/31/22 0500  Phosphorus  (TPN Lab Panel)  Every Mon,Thu (0500),   R     Question:  Specimen collection method  Answer:  Unit=Unit collect   01/30/22 0742   01/31/22 0500  Triglycerides  (TPN Lab Panel)  Every Monday (0500),   R     Question:  Specimen collection method  Answer:  Unit=Unit collect   01/30/22 0742          Data Reviewed: I have personally reviewed following labs and imaging studies CBC: Recent Labs  Lab 02/04/22 0531 02/05/22 0539 02/07/22 0517  02/08/22 0606 02/09/22 0610  WBC 23.0* 22.8* 23.5* 25.7* 29.9*  HGB 10.8* 10.4* 10.6* 10.7* 11.0*  HCT 34.0* 34.1* 33.1* 33.0* 34.1*  MCV 96.0 100.3* 93.5 93.0 93.7  PLT 476* 449* 541* 622* 147*   Basic Metabolic Panel: Recent Labs  Lab 02/03/22 0523 02/04/22 0531 02/05/22 0822 02/06/22 1115 02/07/22 0517 02/08/22 0606 02/09/22 0610  NA 132* 133* 135 139 140 139 140  K 4.2 4.0 3.3* 3.3* 3.4* 3.6 3.6  CL 102 102 103 107 106 105 107  CO2 24 25 24 25 25 26 25   GLUCOSE 142* 159* 144* 162* 193* 162* 173*  BUN 47* 40* 40* 45* 45* 50* 50*  CREATININE 1.50* 1.31* 1.21 1.28* 1.27* 1.39* 1.50*  CALCIUM 8.4* 8.6* 8.5* 8.4* 8.6* 8.3* 8.3*  MG 2.1 1.9 2.0  --  2.2  --  2.3  PHOS 3.8 3.3  --   --  3.6  --  4.0   GFR: Estimated Creatinine Clearance: 38.8 mL/min (A) (by C-G formula based on SCr of 1.5 mg/dL  (H)). Liver Function Tests: Recent Labs  Lab 02/03/22 0523 02/07/22 0517 02/08/22 0606 02/09/22 0610  AST 22 207* 142* 108*  ALT 11 122* 114* 94*  ALKPHOS 56 218* 245* 207*  BILITOT 0.2* 0.6 0.6 0.8  PROT 5.3* 5.7* 5.4* 5.4*  ALBUMIN 1.8* 1.7* 1.6* 1.5*   No results for input(s): LIPASE, AMYLASE in the last 168 hours. No results for input(s): AMMONIA in the last 168 hours. Coagulation Profile: No results for input(s): INR, PROTIME in the last 168 hours. Cardiac Enzymes: No results for input(s): CKTOTAL, CKMB, CKMBINDEX, TROPONINI in the last 168 hours. BNP (last 3 results) No results for input(s): PROBNP in the last 8760 hours. HbA1C: No results for input(s): HGBA1C in the last 72 hours. CBG: Recent Labs  Lab 02/08/22 0536 02/08/22 1156 02/08/22 1800 02/08/22 2329 02/09/22 0549  GLUCAP 145* 150* 148* 148* 165*   Lipid Profile: Recent Labs    02/07/22 0517  TRIG 119   Thyroid Function Tests: No results for input(s): TSH, T4TOTAL, FREET4, T3FREE, THYROIDAB in the last 72 hours. Anemia Panel: No results for input(s): VITAMINB12, FOLATE, FERRITIN, TIBC, IRON, RETICCTPCT in the last 72 hours. Sepsis Labs: Recent Labs  Lab 02/03/22 0523  LATICACIDVEN 1.1    Recent Results (from the past 240 hour(s))  Culture, blood (routine x 2)     Status: None   Collection Time: 02/04/22  3:42 PM   Specimen: BLOOD  Result Value Ref Range Status   Specimen Description BLOOD LEFT ANTECUBITAL  Final   Special Requests   Final    BOTTLES DRAWN AEROBIC AND ANAEROBIC Blood Culture adequate volume   Culture   Final    NO GROWTH 5 DAYS Performed at [facility], [address] [place], [place] [address]    Report Status 02/09/2022 FINAL  Final  Culture, blood (routine x 2)     Status: None   Collection Time: 02/04/22  3:42 PM   Specimen: BLOOD  Result Value Ref Range Status   Specimen Description BLOOD RIGHT ANTECUBITAL  Final   Special Requests   Final    BOTTLES  DRAWN AEROBIC ONLY Blood Culture results may not be optimal due to an inadequate volume of blood received in culture bottles   Culture   Final    NO GROWTH 5 DAYS Performed at [facility], [place] [address], [place], [place] [address]    Report Status 02/09/2022 FINAL  Final    Antimicrobials: Anti-infectives (From admission, onward)    Start     Dose/Rate Route Frequency Ordered Stop   02/04/22 1545  piperacillin-tazobactam (ZOSYN) IVPB 3.375 g        3.375 g 12.5 mL/hr over 240 Minutes Intravenous Every 8 hours 02/04/22 1453 02/09/22 1359   02/01/22 2200  ceFAZolin (ANCEF) IVPB 1 g/50 mL premix  Status:  Discontinued        1 g 100 mL/hr over 30 Minutes Intravenous Every 8 hours 02/01/22 1504 02/04/22 1453   02/01/22 0930  cefoTEtan (CEFOTAN) 2 g in sodium chloride 0.9 % 100 mL IVPB        2 g 200 mL/hr over 30 Minutes Intravenous On call to O.R. 01/31/22 1908 02/01/22 1533   01/30/22 1000  cefTRIAXone (ROCEPHIN) 1 g in sodium chloride 0.9 % 100 mL IVPB  Status:  Discontinued        1 g 200 mL/hr over 30 Minutes Intravenous Every 24 hours 01/29/22 0013 02/01/22 1504   01/28/22 1830  cefTRIAXone (ROCEPHIN) 1 g in sodium chloride 0.9 % 100 mL IVPB        1 g 200 mL/hr over 30 Minutes Intravenous  Once 01/28/22 1828 01/28/22 1927      Culture/Microbiology    Component Value Date/Time   SDES BLOOD LEFT ANTECUBITAL 02/04/2022 1542   SDES BLOOD RIGHT ANTECUBITAL 02/04/2022 1542   SPECREQUEST  02/04/2022 1542    BOTTLES DRAWN AEROBIC AND ANAEROBIC Blood Culture adequate volume   SPECREQUEST  02/04/2022 1542    BOTTLES DRAWN AEROBIC ONLY Blood Culture results may not be optimal due to an inadequate volume of blood received in culture bottles   CULT  02/04/2022 1542    NO GROWTH 5 DAYS Performed at [facility], [place] [address] [place], [place] [address]    CULT  02/04/2022 1542    NO GROWTH 5 DAYS Performed at [facility], [place] [address], [place], [place]  [address]    REPTSTATUS 02/09/2022 FINAL 02/04/2022 1542   REPTSTATUS 02/09/2022 FINAL 02/04/2022 1542  Radiology Studies: CT ABDOMEN PELVIS WO CONTRAST  Result Date: 02/08/2022 CLINICAL DATA:  Abdominal pain. Postop, assess for intra-abdominal abscess. Patient is post small bowel resection and excisional biopsy of mesenteric mass on 02/01/2022 EXAM: CT ABDOMEN AND PELVIS WITHOUT CONTRAST TECHNIQUE: Multidetector CT imaging of the abdomen and pelvis was performed following the standard protocol without IV contrast. Patient declined IV contrast administration. RADIATION DOSE REDUCTION: This exam was performed according to the departmental dose-optimization program which includes automated exposure control, adjustment of the mA and/or kV according to patient size and/or use of iterative reconstruction technique. COMPARISON:  Preoperative CT 01/28/2022 FINDINGS: Lower chest: Pulmonary metastatic disease in the lung bases with multiple pulmonary nodules. Enlarging left pleural effusion with pleural thickening, possibly metastatic. Increasing compressive atelectasis in the included left lung base. Moderate-sized right pleural effusion is new. Hepatobiliary: Scattered water density lesions in the liver are unchanged from prior exam likely cysts cholecystectomy. Common bile duct is difficult defined, there is no visualized biliary dilatation. Pancreas: The pancreas is difficult to delineate on the current exam, electronic records states distal pancreatectomy. Spleen: Surgically absent. Adrenals/Urinary Tract: No adrenal nodule. There is right hydroureteronephrosis with the ureter being dilated to the pelvis, the previous hyperattenuating focus in the distal right ureter tentatively visualized series 2, image 75. Marked right renal atrophy. Left nephrostomy and left nephroureteral stent in place. There is no left hydronephrosis. The urinary  bladder is poorly defined, appears thick walled. There is air in the bladder.  Stomach/Bowel: Detailed bowel assessment is limited given paucity of intra-abdominal fat and motion. Enteric tube terminates in the gastroesophageal junction. The stomach is decompressed. A duodenal stent is unchanged in position, patent. Descending colostomy with Hartmann's pouch. Small bowel is mildly prominent but nonobstructed, and enteric contrast is seen throughout the colon. Bowel loops in the pelvis are difficult to define, there is fluid and stool within the and set to sigmoid colon. Vascular/Lymphatic: Aortic atherosclerosis without aneurysm. Limited assessment for adenopathy given lack of IV contrast and motion artifact. Reproductive: Prostate is unremarkable. Other: There is a small amount of non organized free fluid in the pelvis. Moderate right upper quadrant ascites. Generalized edema of the intra-abdominal and mesenteric fat. There is no evidence of focal fluid collection/abscess. No free air. Diffuse subcutaneous edema. Soft tissue nodule in the right anterior abdominal wall. Musculoskeletal: No focal bone lesion. Degenerative change in the spine and hips. IMPRESSION: 1. No evidence of postoperative intra-abdominal abscess. Moderate right upper quadrant free fluid and small amount of free fluid in the pelvis is non organized. 2. Enteric tube terminates at the gastroesophageal junction. 3. Left nephrostomy and left nephroureteral stent in place without left hydronephrosis. Chronic right renal atrophy and hydroureteronephrosis. 4. Pulmonary metastatic disease in the lung bases with multiple pulmonary nodules. Enlarging left pleural effusion with pleural thickening, possibly metastatic. Moderate-sized right pleural effusion is new. 5. Diffuse body wall edema. 6. Soft tissue nodule in the right anterior abdominal wall is unchanged, possibly metastatic. 7. Additional chronic findings are unchanged. Aortic Atherosclerosis (ICD10-I70.0). Electronically Signed   By: Keith Rake M.D.   On: 02/08/2022  00:13   DG Abd Portable 1V  Result Date: 02/07/2022 CLINICAL DATA:  NG tube placement EXAM: PORTABLE ABDOMEN - 1 VIEW COMPARISON:  Previous studies including the examination done earlier today FINDINGS: Tip enteric tube is noted at the gastroesophageal junction. There is cephalad migration of the tip of enteric tube since the previous examination. Side port in the enteric tube is noted in the lower thoracic esophagus. Bowel gas pattern is nonspecific. Surgical clips are seen in the right upper quadrant. Surgical staples are seen in the epigastrium. There is percutaneous left nephrostomy catheter in place. Left ureteral stent is seen. There is possible stent in a small bowel loop in the mid abdomen. There is homogeneous opacification with air bronchogram in the left lower lung fields suggesting atelectasis/pneumonia. There are faint nodular densities in the right lower lung fields suggesting pulmonary metastatic disease. IMPRESSION: Tip of enteric tube is at the gastroesophageal junction with side port in the lower thoracic esophagus. Other findings as described in the body of the report. Electronically Signed   By: Elmer Picker M.D.   On: 02/07/2022 15:43     LOS: 11 days   Antonieta Pert, MD [facility]  02/09/2022, 11:40 AM

## 2022-02-09 NOTE — Progress Notes (Signed)
Daily Progress Note   Patient Name: Kevin Villegas       Date: 02/09/2022 DOB: Apr 12, 1941  Age: 81 y.o. MRN#: [id number] Attending Physician: Antonieta Pert, MD Primary Care Physician: Dettinger, [name], MD Admit Date: 01/28/2022  Reason for Consultation/Follow-up: Establishing goals of care  Subjective: I saw and examined Kevin Villegas.    Denies complaints today.  Said his only concern is being cold and asked for assistance with his blanket.  We discussed slight increase in Cr and elevated WBC.  He stated that "all we can do is keep a close eye on it."  He continues to try to progress his diet and remains focused on improving his nutrition.  Length of Stay: 11  Current Medications: Scheduled Meds:   Chlorhexidine Gluconate Cloth  6 each Topical Daily   enoxaparin (LOVENOX) injection  40 mg Subcutaneous Q24H   feeding supplement  1 Container Oral TID BM   insulin aspart  0-15 Units Subcutaneous Q6H   lidocaine  1 patch Transdermal Q24H   lip balm  1 application Topical BID   sodium chloride flush  3 mL Intravenous Q12H    Continuous Infusions:  sodium chloride Stopped (01/28/22 2301)   sodium chloride 50 mL/hr at 02/09/22 1215   methocarbamol (ROBAXIN) IV 1,000 mg (02/09/22 0606)   TPN ADULT (ION) 85 mL/hr at 02/08/22 1708   TPN ADULT (ION)      PRN Meds: sodium chloride, alum & mag hydroxide-simeth, antiseptic oral rinse, diphenhydrAMINE, fentaNYL (SUBLIMAZE) injection, iohexol, menthol-cetylpyridinium, phenol, prochlorperazine, simethicone, sodium chloride flush  Physical Exam         Appears chronically ill.  Resting in bed Has NGT but not to suction No edema Awake alert S 1 S 2   Vital Signs: BP 118/69 (BP Location: Right Arm)    Pulse 84    Temp 97.8 F (36.6 C)  (Oral)    Resp 19    Ht 5\' 11"  (1.803 m)    Wt 69.8 kg    SpO2 93%    BMI 21.46 kg/m  SpO2: SpO2: 93 % O2 Device: O2 Device: Room Air O2 Flow Rate: O2 Flow Rate (L/min): 2 L/min  Intake/output summary:  Intake/Output Summary (Last 24 hours) at 02/09/2022 1511 Last data filed at 02/09/2022 1300 Gross per 24 hour  Intake 240 ml  Output 2000 ml  Net -1760 ml    LBM: Last BM Date : 02/08/22 Baseline Weight: Weight: 57 kg Most recent weight: Weight: 69.8 kg       Palliative Assessment/Data:      Patient Active Problem List   Diagnosis Date Noted   E-coli UTI 02/01/2022   SBO (small bowel obstruction) ([place]) 01/28/2022   Subcutaneous nodule of abdominal wall 01/09/2022   Partial small bowel obstruction ([place]) 01/07/2022   COVID-19 virus infection 01/07/2022   Colostomy in place [facility]) 11/10/2021   Stage 3a chronic kidney disease (CKD) ([place]) 11/10/2021   Pyelonephritis    Essential hypertension 10/30/2021   Hydronephrosis of left kidney 10/28/2021   Ureteral stricture, left 10/27/2021   Protein-calorie malnutrition, severe 06/12/2021   Hydroureter on right 06/10/2021   Aortic atherosclerosis ([place]) 06/10/2021   Leukocytosis 06/02/2021   Right ureteral stone 06/02/2021   Colon stricture ([place])    Pancreatic mass 06/27/2019   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)M1  with metastatic disease 06/27/2019   Rhinitis medicamentosa 02/26/2019    Palliative Care Assessment & Plan   Patient Profile:    Assessment:  81 year old gentleman who lives at home with his wife in [place], [place].  His daughter lives nearby.  He has a past medical history of stage IV pancreatic cancer, status post colectomy, hydronephrosis status post nephrostomy, underlying hypertension small bowel obstruction stage II chronic kidney disease.  Patient presented with ongoing abdominal pain, known to have concerns for small bowel obstruction issues.  Underwent a diagnostic laparoscopy with  lysis of additions on 2-14.  He is being followed by general surgery and medical oncology.  Has been placed on TPN for nutritional support.  Has been placed on NG tube for suction.  So far, patient has not not had any bowel movements or passed flatus. Palliative medicine team has been consulted for CODE STATUS and goals of care discussions and for additional support.  Recommendations/Plan: DNR Continue current mode of care: patient and family hopeful for tolerance/progression of diet.  He reports talking this morning with Dr. Marin Olp and understands need to improve both nutrition and functional status before there could even be consideration for systemic therapy for his cancer.  Code Status:    Code Status Orders  (From admission, onward)           Start     Ordered   02/06/22 1335  Do not attempt resuscitation (DNR)  Continuous       Question Answer Comment  In the event of cardiac or respiratory ARREST Do not call a code blue   In the event of cardiac or respiratory ARREST Do not perform Intubation, CPR, defibrillation or ACLS   In the event of cardiac or respiratory ARREST Use medication by any route, position, wound care, and other measures to relive pain and suffering. May use oxygen, suction and manual treatment of airway obstruction as needed for comfort.      02/06/22 1334           Code Status History     Date Active Date Inactive Code Status Order ID Comments User Context   01/29/2022 0014 02/06/2022 1334 Full Code [id number]  Marcelyn Bruins, MD Inpatient   01/07/2022 2129 01/10/2022 2258 Full Code [id number]  Rise Patience, MD Inpatient   12/10/2021 1331 12/11/2021 2137 Full Code [id number]  Jonnie Finner, DO ED   11/10/2021 0354 11/13/2021 2039 Full Code [id number]  Chotiner, Yevonne Aline, MD Inpatient  10/27/2021 2317 10/30/2021 1833 Full Code [id number]  Lenore Cordia, MD Inpatient   06/09/2021 2201 06/18/2021 1923 Full Code [id number]  Lenore Cordia, MD  Inpatient   06/01/2021 2102 06/05/2021 1830 Full Code [id number]  Rhetta Mura, DO ED   05/18/2021 1200 05/26/2021 1958 Partial Code [id number]  Jonnie Finner, DO Inpatient   12/12/2014 1730 12/17/2014 1704 Full Code [id number]  Velvet Bathe, MD Inpatient       Prognosis:  Guarded   Discharge Planning: To Be Determined  Care plan was discussed with patient.  Thank you for allowing the Palliative Medicine Team to assist in the care of this patient. Micheline Rough, MD  Please contact Palliative Medicine Team phone at [phone] for questions and concerns.

## 2022-02-09 NOTE — Progress Notes (Signed)
PHARMACY - TOTAL PARENTERAL NUTRITION CONSULT NOTE   Indication: Small bowel obstruction and inability to meet nutritional needs w/ PO intake  Patient Measurements: Height: 5' 11" (180.3 cm) Weight: 69.8 kg (153 lb 14.1 oz) IBW/kg (Calculated) : 75.3 TPN AdjBW (KG): 57.7 Body mass index is 21.46 kg/m. Usual Weight: Weight noted in November 2022 to be 63.5 kg  Assessment: 81 yo male presenting to [facility] ED for abdominal pain, nausea, vomiting. PMH includes pancreatic cancer with evidence of metastatic disease to his bladder, lungs, and pleural space and history of SBO.  S/p stent placement between stomach and intestines and colostomy placement in June 2022. Pharmacy is consulted to begin TPN for possible bridge to surgery given low pre-albumin and inability to meet nutritional needs w/ PO intake.   Glucose / Insulin: No hx DM;  - mSS q6h (used 11 units in 24 hrs) - cbgs (goal <150): most <150 Electrolytes: K 3.6, CorrCa 10.22; CL and CO2 wnl - Mag wnl but is at upper end of goal range; phos wnl but trending up to 4 Renal: SCr up 1.50 (crcl~39); BUN elevated but stable at 50 Hepatic: AST/ALT trending down 94/207, Alk phos elevated; Tbili wnl -  Pre-albumin low at 8.2 (2/11); albumin low at 1.7. TG 119 (2/20) Intake / Output; MIVF:  - I/O: +2340 ml - UOP: 1875 ml - NG: 600 ml GI Imaging: - 2/10 CT abdomen pelvis: re-demonstration of high-grade partial SBO with transition point in the upper left pelvis psych likely due to adhesion.  - 2/20 abd CT: No evidence of postoperative intra-abdominal abscess. BL pleural effusions. Pulmonary metastatic disease in the lung bases with multiple pulmonary nodules. GI Surgeries / Procedures:  - 2/14: laparoscopic lysis of adhesions, small bowel resection, excisional bipsy of mesentery mass   Central access: PAC TPN start date: 2/12  Nutritional Goals: Goal TPN rate is 85 mL/hr (provides 112g of protein and 2061 kcals per day)  RD Assessment:   Estimated Needs Total Energy Estimated Needs: 2050-2250 kcal Total Protein Estimated Needs: 105-120 grams Total Fluid Estimated Needs: >/= 2 L/day  Current Nutrition:  NPO except ice chips  TPN  Plan:   At 1800: - Continue TPN at goal rate of 23m/hr - Electrolytes in TPN:  Na 1044m/L K 3087mL Ca 2mE34m Mg reduce to 5 mEq/L Phos reduce to 10mm58m Cl:Ac 1:1 - Add standard trace elements daily to TPN. Add MVI to TPN on M/W/F only due to national shortage.  - continue  moderate SSI q6h and adjust as needed. - TPN labs on Mon/Thurs   Karlei Waldo PDia SitterrmD, BCPS 02/09/2022 7:42 AM

## 2022-02-09 NOTE — Plan of Care (Signed)

## 2022-02-09 NOTE — Progress Notes (Signed)
PHYSICAL THERAPY  Attempted to see twice (student nurses in room then chest X ray) Will attempt to see tomorrow  Rica Koyanagi  PTA Acute  Rehabilitation Services Pager      [phone] Office      [phone]

## 2022-02-09 NOTE — Progress Notes (Addendum)
Occupational Therapy Treatment Patient Details Name: Kevin Villegas MRN: [id number] DOB: 02/19/1941 Today's Date: 02/09/2022   History of present illness Patient is a 81 year old male who presented on 01/28/22 with 2-3 day history of abdominal pain. patient was found to have SBO. patient underwent diagnostic laparoscopy with lysis of adhesions 2/14. PMH:HTN, stage 4 pancreatic cancer,CKD III, status post colectomy, hydronephrosis status post nephrostomy,   OT comments  Treatment focused on OOB ADLs. Patient needing assistance to donn and doff socks but able t ambulate to bathroom for standing toileting. Use of RW for safety as he felt unsteady and min guard. He ambulated too quickly and somewhat unsafely to bathroom due to urinary urgency that led to some urinary incontinence. He was able to stand over toilet and maintain balance for toileting. Once back to recliner patient able to assist with LB bathing and changin his gown. He requested to go back to bed due to sleeping poorly last night. +2 assistance for safety to assist with managing IV pole, drain and patient during in room ambulation. He is agreeable to continued therapy in hospital to get stronger and improve independence.    Recommendations for follow up therapy are one component of a multi-disciplinary discharge planning process, led by the attending physician.  Recommendations may be updated based on patient status, additional functional criteria and insurance authorization.    Follow Up Recommendations  Skilled nursing-short term rehab (<3 hours/day)    Assistance Recommended at Discharge Frequent or constant Supervision/Assistance  Patient can return home with the following  A lot of help with bathing/dressing/bathroom;Assistance with cooking/housework;Direct supervision/assist for financial management;Assist for transportation;Direct supervision/assist for medications management;Help with stairs or ramp for entrance;A little help with  walking and/or transfers   Equipment Recommendations  Other (comment) (TBD)    Recommendations for Other Services      Precautions / Restrictions Precautions Precautions: Fall Precaution Comments: abdominal wounds, ostomy, drain, Restrictions Weight Bearing Restrictions: No          Balance Overall balance assessment: Mild deficits observed, not formally tested                                         ADL either performed or assessed with clinical judgement   ADL Overall ADL's : Needs assistance/impaired                     Lower Body Dressing: Moderate assistance Lower Body Dressing Details (indicate cue type and reason): needed assist to don and doff socks - able to manage clothing above knees Toilet Transfer: Min guard;+2 for safety/equipment;Regular Toilet Toilet Transfer Details (indicate cue type and reason): ambulated from side of bed to bathroom with RW - with predominantly min guard. +2 to manage IV pole. Patient having urinary urgency and moving too quickly Toileting- Clothing Manipulation and Hygiene: Min guard Toileting - Clothing Manipulation Details (indicate cue type and reason): performed standing toileting - able to manage gown and have hands off of walker     Functional mobility during ADLs: +2 for safety/equipment General ADL Comments: Min assist to rise from low bed and min guard with RW in room. Once in recliner patient able to wash and dry periarea and had assistance to change hospital gown. Had assistance to change socks. Min guard with  verbal cue for technique to stand from recliner and return to bed.    Extremity/Trunk  Assessment Upper Extremity Assessment Upper Extremity Assessment: Overall WFL for tasks assessed            Vision   Vision Assessment?: No apparent visual deficits   Perception     Praxis      Cognition Arousal/Alertness: Awake/alert Behavior During Therapy: WFL for tasks  assessed/performed Overall Cognitive Status: Within Functional Limits for tasks assessed                                 General Comments: somewhat sleepy today - reports not sleeping well last night        Exercises      Shoulder Instructions       General Comments      Pertinent Vitals/ Pain       Pain Assessment Pain Assessment: No/denies pain  Home Living                                          Prior Functioning/Environment              Frequency  Min 2X/week        Progress Toward Goals  OT Goals(current goals can now be found in the care plan section)  Progress towards OT goals: Progressing toward goals  Acute Rehab OT Goals Patient Stated Goal: to work on getting stronger in order to go home OT Goal Formulation: With patient Time For Goal Achievement: 02/16/22 Potential to Achieve Goals: Good  Plan Discharge plan remains appropriate    Co-evaluation          OT goals addressed during session: ADL's and self-care      AM-PAC OT "6 Clicks" Daily Activity     Outcome Measure   Help from another person eating meals?: A Little Help from another person taking care of personal grooming?: A Little Help from another person toileting, which includes using toliet, bedpan, or urinal?: A Little Help from another person bathing (including washing, rinsing, drying)?: A Lot Help from another person to put on and taking off regular upper body clothing?: A Little Help from another person to put on and taking off regular lower body clothing?: A Lot 6 Click Score: 16    End of Session Equipment Utilized During Treatment: Rolling walker (2 wheels)  OT Visit Diagnosis: Unsteadiness on feet (R26.81);Pain;Muscle weakness (generalized) (M62.81)   Activity Tolerance Patient limited by fatigue   Patient Left in bed;with call bell/phone within reach;with bed alarm set;with nursing/sitter in room   Nurse Communication Mobility  status (okay to see)        Time: 8550-1586 OT Time Calculation (min): 18 min  Charges: OT General Charges $OT Visit: 1 Visit OT Treatments $Self Care/Home Management : 8-22 mins  Derl Barrow, OTR/L [employer]  Office [phone] Pager: [facility] 02/09/2022, 9:26 AM

## 2022-02-09 NOTE — Progress Notes (Signed)
Nutrition Follow-up  DOCUMENTATION CODES:   Severe malnutrition in context of chronic illness, Underweight  INTERVENTION:   -TPN management per Pharmacy  -Boost Breeze po TID, each supplement provides 250 kcal and 9 grams of protein  NUTRITION DIAGNOSIS:   Severe Malnutrition related to chronic illness, cancer and cancer related treatments as evidenced by severe fat depletion, severe muscle depletion, percent weight loss.  Ongoing.  GOAL:   Patient will meet greater than or equal to 90% of their needs  Meeting with TPN  MONITOR:   Labs, Weight trends, Skin, I & O's (TPN)  ASSESSMENT:   81 y.o. male with medical history of stage 4 pancreatic cancer, hydronephrosis s/p nephrostomy, HTN, SBO s/p colectomy, and stage 3 CKD. He presented to the ED due to ongoing abdominal pain x2-3 days with pain being 8/10. He also reported N/V. He was having some ostomy output. He was found to have recurrent SBO and General Surgery was consulted.  2/10: admitted 2/14: s/p dx lap, SB resection, LOA 2/21: CLD  Patient's diet was advanced to full liquids today around lunch. Boost Breeze was ordered. Will see if pt accepts. Does like Ensure, will likely switch once off TPN and tolerating PO.  TPN continues at 85 ml/hr, providing 2061 kcals and 112g protein.  Admission weight: 125 lbs Current weight: 153 lbs   I/Os: +6.8L since admit UOP: 1375 ml x 24 hrs  Medications reviewed.  Labs reviewed: CBGs: 148-169  Diet Order:   Diet Order             Diet full liquid Room service appropriate? Yes; Fluid consistency: Thin  Diet effective now                   EDUCATION NEEDS:   Education needs have been addressed  Skin:  Skin Assessment: Skin Integrity Issues: Skin Integrity Issues:: Incisions Incisions: 2/14 -abdomen  Last BM:  2/22 -colostomy  Height:   Ht Readings from Last 1 Encounters:  01/28/22 5\' 11"  (1.803 m)    Weight:   Wt Readings from Last 1 Encounters:   02/09/22 69.8 kg    Ideal Body Weight:  78.2 kg  BMI:  Body mass index is 21.46 kg/m.  Estimated Nutritional Needs:   Kcal:  9532-0233 kcal  Protein:  105-120 grams  Fluid:  >/= 2 L/day   Clayton Bibles, MS, RD, LDN Inpatient Clinical Dietitian Contact information available via Amion

## 2022-02-09 NOTE — Progress Notes (Addendum)
Progress Note  8 Days Post-Op  Subjective: Tolerating clears without nausea, emesis or worsened abdominal pain. Still having abdominal pain but states it is the same as yesterday. Tells me this morning that "he just wants to rest"  Objective: Vital signs in last 24 hours: Temp:  [97.5 F (36.4 C)-99 F (37.2 C)] 97.5 F (36.4 C) (02/22 0545) Pulse Rate:  [77-90] 77 (02/22 0545) Resp:  [18] 18 (02/22 0545) BP: (133-140)/(81-90) 133/90 (02/22 0545) SpO2:  [92 %-93 %] 93 % (02/22 0545) Weight:  [69.8 kg] 69.8 kg (02/22 0545) Last BM Date : 02/08/22  Intake/Output from previous day: 02/21 0701 - 02/22 0700 In: 235 [P.O.:235] Out: 2575 [Urine:1875; Emesis/NG output:100; Stool:600] Intake/Output this shift: No intake/output data recorded.  PE: General: chronically ill appearing, male who is laying in bed in NAD HEENT: head is normocephalic, atraumatic. Mouth is pink and moist Heart: Palpable radial pulses bilaterally Lungs: Respiratory effort nonlabored Abd: soft, ND, + BS, moderate TTP without rebound or guarding greatest in RLQ. incisions intact - no erythema, induration, or purulent drainage. Large amount of liquid stool in colostomy. MSK: all 4 extremities are symmetrical with no cyanosis, clubbing, or edema. No calf edema or TTP bilaterally Skin: warm and dry Psych: A&Ox3 with an appropriate affect.    Lab Results:  Recent Labs    02/08/22 0606 02/09/22 0610  WBC 25.7* 29.9*  HGB 10.7* 11.0*  HCT 33.0* 34.1*  PLT 622* 642*    BMET Recent Labs    02/08/22 0606 02/09/22 0610  NA 139 140  K 3.6 3.6  CL 105 107  CO2 26 25  GLUCOSE 162* 173*  BUN 50* 50*  CREATININE 1.39* 1.50*  CALCIUM 8.3* 8.3*    PT/INR No results for input(s): LABPROT, INR in the last 72 hours. CMP     Component Value Date/Time   NA 140 02/09/2022 0610   NA 139 12/31/2021 1127   K 3.6 02/09/2022 0610   CL 107 02/09/2022 0610   CO2 25 02/09/2022 0610   GLUCOSE 173 (H)  02/09/2022 0610   BUN 50 (H) 02/09/2022 0610   BUN 38 (H) 12/31/2021 1127   CREATININE 1.50 (H) 02/09/2022 0610   CREATININE 1.58 (H) 11/18/2021 1149   CALCIUM 8.3 (L) 02/09/2022 0610   PROT 5.4 (L) 02/09/2022 0610   PROT 7.2 12/31/2021 1127   ALBUMIN 1.5 (L) 02/09/2022 0610   ALBUMIN 3.9 12/31/2021 1127   AST 108 (H) 02/09/2022 0610   AST 44 (H) 11/18/2021 1149   ALT 94 (H) 02/09/2022 0610   ALT 45 (H) 11/18/2021 1149   ALKPHOS 207 (H) 02/09/2022 0610   BILITOT 0.8 02/09/2022 0610   BILITOT 0.3 12/31/2021 1127   BILITOT 0.3 11/18/2021 1149   GFRNONAA 47 (L) 02/09/2022 0610   GFRNONAA 44 (L) 11/18/2021 1149   GFRAA >60 11/24/2019 1600   Lipase     Component Value Date/Time   LIPASE 42 01/28/2022 1500       Studies/Results: CT ABDOMEN PELVIS WO CONTRAST  Result Date: 02/08/2022 CLINICAL DATA:  Abdominal pain. Postop, assess for intra-abdominal abscess. Patient is post small bowel resection and excisional biopsy of mesenteric mass on 02/01/2022 EXAM: CT ABDOMEN AND PELVIS WITHOUT CONTRAST TECHNIQUE: Multidetector CT imaging of the abdomen and pelvis was performed following the standard protocol without IV contrast. Patient declined IV contrast administration. RADIATION DOSE REDUCTION: This exam was performed according to the departmental dose-optimization program which includes automated exposure control, adjustment of the mA and/or  kV according to patient size and/or use of iterative reconstruction technique. COMPARISON:  Preoperative CT 01/28/2022 FINDINGS: Lower chest: Pulmonary metastatic disease in the lung bases with multiple pulmonary nodules. Enlarging left pleural effusion with pleural thickening, possibly metastatic. Increasing compressive atelectasis in the included left lung base. Moderate-sized right pleural effusion is new. Hepatobiliary: Scattered water density lesions in the liver are unchanged from prior exam likely cysts cholecystectomy. Common bile duct is difficult  defined, there is no visualized biliary dilatation. Pancreas: The pancreas is difficult to delineate on the current exam, electronic records states distal pancreatectomy. Spleen: Surgically absent. Adrenals/Urinary Tract: No adrenal nodule. There is right hydroureteronephrosis with the ureter being dilated to the pelvis, the previous hyperattenuating focus in the distal right ureter tentatively visualized series 2, image 75. Marked right renal atrophy. Left nephrostomy and left nephroureteral stent in place. There is no left hydronephrosis. The urinary bladder is poorly defined, appears thick walled. There is air in the bladder. Stomach/Bowel: Detailed bowel assessment is limited given paucity of intra-abdominal fat and motion. Enteric tube terminates in the gastroesophageal junction. The stomach is decompressed. A duodenal stent is unchanged in position, patent. Descending colostomy with Hartmann's pouch. Small bowel is mildly prominent but nonobstructed, and enteric contrast is seen throughout the colon. Bowel loops in the pelvis are difficult to define, there is fluid and stool within the and set to sigmoid colon. Vascular/Lymphatic: Aortic atherosclerosis without aneurysm. Limited assessment for adenopathy given lack of IV contrast and motion artifact. Reproductive: Prostate is unremarkable. Other: There is a small amount of non organized free fluid in the pelvis. Moderate right upper quadrant ascites. Generalized edema of the intra-abdominal and mesenteric fat. There is no evidence of focal fluid collection/abscess. No free air. Diffuse subcutaneous edema. Soft tissue nodule in the right anterior abdominal wall. Musculoskeletal: No focal bone lesion. Degenerative change in the spine and hips. IMPRESSION: 1. No evidence of postoperative intra-abdominal abscess. Moderate right upper quadrant free fluid and small amount of free fluid in the pelvis is non organized. 2. Enteric tube terminates at the  gastroesophageal junction. 3. Left nephrostomy and left nephroureteral stent in place without left hydronephrosis. Chronic right renal atrophy and hydroureteronephrosis. 4. Pulmonary metastatic disease in the lung bases with multiple pulmonary nodules. Enlarging left pleural effusion with pleural thickening, possibly metastatic. Moderate-sized right pleural effusion is new. 5. Diffuse body wall edema. 6. Soft tissue nodule in the right anterior abdominal wall is unchanged, possibly metastatic. 7. Additional chronic findings are unchanged. Aortic Atherosclerosis (ICD10-I70.0). Electronically Signed   By: Keith Rake M.D.   On: 02/08/2022 00:13   DG Abd Portable 1V  Result Date: 02/07/2022 CLINICAL DATA:  NG tube placement EXAM: PORTABLE ABDOMEN - 1 VIEW COMPARISON:  Previous studies including the examination done earlier today FINDINGS: Tip enteric tube is noted at the gastroesophageal junction. There is cephalad migration of the tip of enteric tube since the previous examination. Side port in the enteric tube is noted in the lower thoracic esophagus. Bowel gas pattern is nonspecific. Surgical clips are seen in the right upper quadrant. Surgical staples are seen in the epigastrium. There is percutaneous left nephrostomy catheter in place. Left ureteral stent is seen. There is possible stent in a small bowel loop in the mid abdomen. There is homogeneous opacification with air bronchogram in the left lower lung fields suggesting atelectasis/pneumonia. There are faint nodular densities in the right lower lung fields suggesting pulmonary metastatic disease. IMPRESSION: Tip of enteric tube is at the gastroesophageal junction  with side port in the lower thoracic esophagus. Other findings as described in the body of the report. Electronically Signed   By: Elmer Picker M.D.   On: 02/07/2022 15:43   DG Abd Portable 1V  Result Date: 02/07/2022 CLINICAL DATA:  Confirm nasogastric tube placement. EXAM:  PORTABLE ABDOMEN - 1 VIEW COMPARISON:  02/07/2022 at 5 a.m. FINDINGS: Nasogastric tube is in place, tip overlying the level of the proximal stomach. Side port is in the region of the gastroesophageal junction. Position is similar to the prior study. Percutaneous nephrostomy and ureteral catheter appear unchanged. Duodenal stent is unchanged. No evidence for free intraperitoneal air. There is mild dilatation of small bowel loops, only partially imaged. There continues to be dense opacity of the LEFT lung base and numerous rounded masses throughout the lungs. IMPRESSION: Nasogastric tube tip overlying the level of the proximal stomach, similar in position to study. Electronically Signed   By: Nolon Nations M.D.   On: 02/07/2022 11:40    Anti-infectives: Anti-infectives (From admission, onward)    Start     Dose/Rate Route Frequency Ordered Stop   02/04/22 1545  piperacillin-tazobactam (ZOSYN) IVPB 3.375 g        3.375 g 12.5 mL/hr over 240 Minutes Intravenous Every 8 hours 02/04/22 1453 02/09/22 1359   02/01/22 2200  ceFAZolin (ANCEF) IVPB 1 g/50 mL premix  Status:  Discontinued        1 g 100 mL/hr over 30 Minutes Intravenous Every 8 hours 02/01/22 1504 02/04/22 1453   02/01/22 0930  cefoTEtan (CEFOTAN) 2 g in sodium chloride 0.9 % 100 mL IVPB        2 g 200 mL/hr over 30 Minutes Intravenous On call to O.R. 01/31/22 1908 02/01/22 1533   01/30/22 1000  cefTRIAXone (ROCEPHIN) 1 g in sodium chloride 0.9 % 100 mL IVPB  Status:  Discontinued        1 g 200 mL/hr over 30 Minutes Intravenous Every 24 hours 01/29/22 0013 02/01/22 1504   01/28/22 1830  cefTRIAXone (ROCEPHIN) 1 g in sodium chloride 0.9 % 100 mL IVPB        1 g 200 mL/hr over 30 Minutes Intravenous  Once 01/28/22 1828 01/28/22 1927        Assessment/Plan Stage IV pancreatic cancer with psbo  - POD8 s/p Lap LOA, Small bowel resection, excisional biopsy of mesentery mass by Dr. 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  - CT scan 2/21 without intra abdominal abscess or obstruction. Some free fluid in the pelvis and RUQ postoperatively. Enlarging left pleural effusion and new right pleural effusion. Do not think something intraabdominal is driving his leukocytosis - NGT out yesterday and tolerating clears. Having colostomy output. May advance to fulls today - continue TPN, LFTs elevated in setting of TPN - PT/OT - recc snf with possible progression to [facility]  FEN: CLD, breeze. TPN ID: cefotetan periop, ancef (UTI), zosyn 2/17>> VTE: lovenox  E coli UTI CKD 3A HTN   LOS: 11 days   Winferd Humphrey, [facility] Surgery 02/09/2022, 9:47 AM Please see Amion for pager number during day hours 7:00am-4:30pm

## 2022-02-10 ENCOUNTER — Other Ambulatory Visit: Payer: Self-pay | Admitting: Student

## 2022-02-10 ENCOUNTER — Inpatient Hospital Stay (HOSPITAL_COMMUNITY): Payer: Medicare Other

## 2022-02-10 ENCOUNTER — Other Ambulatory Visit (HOSPITAL_COMMUNITY): Payer: Medicare Other

## 2022-02-10 DIAGNOSIS — J869 Pyothorax without fistula: Secondary | ICD-10-CM | POA: Insufficient documentation

## 2022-02-10 DIAGNOSIS — Z7189 Other specified counseling: Secondary | ICD-10-CM | POA: Diagnosis not present

## 2022-02-10 DIAGNOSIS — C252 Malignant neoplasm of tail of pancreas: Secondary | ICD-10-CM | POA: Diagnosis not present

## 2022-02-10 DIAGNOSIS — Z933 Colostomy status: Secondary | ICD-10-CM | POA: Diagnosis not present

## 2022-02-10 DIAGNOSIS — K56600 Partial intestinal obstruction, unspecified as to cause: Secondary | ICD-10-CM | POA: Diagnosis not present

## 2022-02-10 DIAGNOSIS — J90 Pleural effusion, not elsewhere classified: Secondary | ICD-10-CM

## 2022-02-10 LAB — BASIC METABOLIC PANEL
Anion gap: 7 (ref 5–15)
BUN: 50 mg/dL — ABNORMAL HIGH (ref 8–23)
CO2: 23 mmol/L (ref 22–32)
Calcium: 8.2 mg/dL — ABNORMAL LOW (ref 8.9–10.3)
Chloride: 112 mmol/L — ABNORMAL HIGH (ref 98–111)
Creatinine, Ser: 1.2 mg/dL (ref 0.61–1.24)
GFR, Estimated: >60 mL/min (ref >60)
Glucose, Bld: 160 mg/dL — ABNORMAL HIGH (ref 70–99)
Potassium: 4 mmol/L (ref 3.5–5.1)
Sodium: 142 mmol/L (ref 135–145)

## 2022-02-10 LAB — COMPREHENSIVE METABOLIC PANEL
ALT: 91 U/L — ABNORMAL HIGH (ref 0–44)
AST: 102 U/L — ABNORMAL HIGH (ref 15–41)
Albumin: 1.6 g/dL — ABNORMAL LOW (ref 3.5–5.0)
Alkaline Phosphatase: 204 U/L — ABNORMAL HIGH (ref 38–126)
Anion gap: 7 (ref 5–15)
BUN: 51 mg/dL — ABNORMAL HIGH (ref 8–23)
CO2: 24 mmol/L (ref 22–32)
Calcium: 8.3 mg/dL — ABNORMAL LOW (ref 8.9–10.3)
Chloride: 112 mmol/L — ABNORMAL HIGH (ref 98–111)
Creatinine, Ser: 1.23 mg/dL (ref 0.61–1.24)
GFR, Estimated: 59 mL/min — ABNORMAL LOW (ref >60)
Glucose, Bld: 160 mg/dL — ABNORMAL HIGH (ref 70–99)
Potassium: 4 mmol/L (ref 3.5–5.1)
Sodium: 143 mmol/L (ref 135–145)
Total Bilirubin: 0.7 mg/dL (ref 0.3–1.2)
Total Protein: 5.8 g/dL — ABNORMAL LOW (ref 6.5–8.1)

## 2022-02-10 LAB — BODY FLUID CELL COUNT WITH DIFFERENTIAL
Eos, Fluid: 0 %
Lymphs, Fluid: 0 %
Monocyte-Macrophage-Serous Fluid: 0 % — ABNORMAL LOW (ref 50–90)
Neutrophil Count, Fluid: 100 % — ABNORMAL HIGH (ref 0–25)
Total Nucleated Cell Count, Fluid: 25908 cu mm — ABNORMAL HIGH (ref 0–1000)

## 2022-02-10 LAB — CBC
HCT: 33.7 % — ABNORMAL LOW (ref 39.0–52.0)
Hemoglobin: 10.7 g/dL — ABNORMAL LOW (ref 13.0–17.0)
MCH: 30.3 pg (ref 26.0–34.0)
MCHC: 31.8 g/dL (ref 30.0–36.0)
MCV: 95.5 fL (ref 80.0–100.0)
Platelets: 771 10*3/uL — ABNORMAL HIGH (ref 150–400)
RBC: 3.53 MIL/uL — ABNORMAL LOW (ref 4.22–5.81)
RDW: 17.5 % — ABNORMAL HIGH (ref 11.5–15.5)
WBC: 38.6 10*3/uL — ABNORMAL HIGH (ref 4.0–10.5)
nRBC: 0.1 % (ref 0.0–0.2)

## 2022-02-10 LAB — PROCALCITONIN: Procalcitonin: 1.88 ng/mL

## 2022-02-10 LAB — GLUCOSE, CAPILLARY
Glucose-Capillary: 129 mg/dL — ABNORMAL HIGH (ref 70–99)
Glucose-Capillary: 148 mg/dL — ABNORMAL HIGH (ref 70–99)
Glucose-Capillary: 150 mg/dL — ABNORMAL HIGH (ref 70–99)
Glucose-Capillary: 157 mg/dL — ABNORMAL HIGH (ref 70–99)
Glucose-Capillary: 171 mg/dL — ABNORMAL HIGH (ref 70–99)

## 2022-02-10 LAB — PROTEIN, PLEURAL OR PERITONEAL FLUID: Total protein, fluid: <3 g/dL

## 2022-02-10 LAB — PHOSPHORUS: Phosphorus: 3.6 mg/dL (ref 2.5–4.6)

## 2022-02-10 LAB — LACTATE DEHYDROGENASE, PLEURAL OR PERITONEAL FLUID: LD, Fluid: 4995 U/L — ABNORMAL HIGH (ref 3–23)

## 2022-02-10 LAB — MAGNESIUM: Magnesium: 2.3 mg/dL (ref 1.7–2.4)

## 2022-02-10 MED ORDER — METHOCARBAMOL 500 MG PO TABS
500.0000 mg | ORAL_TABLET | Freq: Four times a day (QID) | ORAL | Status: DC
  Administered 2022-02-10 – 2022-02-18 (×20): 500 mg via ORAL
  Filled 2022-02-10 (×27): qty 1

## 2022-02-10 MED ORDER — ADULT MULTIVITAMIN W/MINERALS CH
1.0000 | ORAL_TABLET | Freq: Every day | ORAL | Status: DC
  Administered 2022-02-10: 1 via ORAL
  Filled 2022-02-10 (×3): qty 1

## 2022-02-10 MED ORDER — ACETAMINOPHEN 325 MG PO TABS
650.0000 mg | ORAL_TABLET | Freq: Four times a day (QID) | ORAL | Status: DC | PRN
Start: 2022-02-10 — End: 2022-02-24
  Administered 2022-02-10 – 2022-02-22 (×4): 650 mg via ORAL
  Administered 2022-02-23: 325 mg via ORAL
  Filled 2022-02-10 (×5): qty 2

## 2022-02-10 MED ORDER — ONDANSETRON HCL 4 MG/2ML IJ SOLN: 4.0000 mg | Freq: Four times a day (QID) | INTRAMUSCULAR | Status: DC | PRN

## 2022-02-10 MED ORDER — TRAVASOL 10 % IV SOLN
INTRAVENOUS | Status: AC
  Filled 2022-02-10: qty 1122

## 2022-02-10 MED ORDER — LIDOCAINE HCL 1 % IJ SOLN
INTRAMUSCULAR | Status: AC
  Administered 2022-02-10: 15 mL
  Filled 2022-02-10: qty 20

## 2022-02-10 MED ORDER — ENSURE ENLIVE PO LIQD
237.0000 mL | Freq: Two times a day (BID) | ORAL | Status: DC
  Administered 2022-02-10 – 2022-02-18 (×5): 237 mL via ORAL

## 2022-02-10 MED ORDER — OXYCODONE HCL 5 MG PO TABS
5.0000 mg | ORAL_TABLET | ORAL | Status: DC | PRN
  Administered 2022-02-11 – 2022-02-22 (×17): 5 mg via ORAL
  Filled 2022-02-10 (×19): qty 1

## 2022-02-10 NOTE — Progress Notes (Addendum)
Progress Note  9 Days Post-Op  Subjective: Tolerating fulls. Continued right lower quadrant abdominal pain. Low appetite. No other complaints  Objective: Vital signs in last 24 hours: Temp:  [97.7 F (36.5 C)-98.2 F (36.8 C)] 98.2 F (36.8 C) (02/23 0518) Pulse Rate:  [73-84] 84 (02/23 0518) Resp:  [16-19] 16 (02/23 0518) BP: (116-155)/(69-88) 140/85 (02/23 0518) SpO2:  [93 %-94 %] 94 % (02/23 0518) Weight:  [70.4 kg] 70.4 kg (02/23 0518) Last BM Date : 02/08/22  Intake/Output from previous day: 02/22 0701 - 02/23 0700 In: 3790 [P.O.:240; I.V.:2700; IV Piggyback:850] Out: 2979 [Urine:1700; Stool:75] Intake/Output this shift: No intake/output data recorded.  PE: General: chronically ill appearing, male who is laying in bed in NAD HEENT: head is normocephalic, atraumatic. Mouth is pink and moist Heart: Palpable radial pulses bilaterally Lungs: Respiratory effort nonlabored Abd: soft, ND, + BS, moderate TTP without rebound or guarding greatest in RLQ. incisions intact - no erythema, induration, or purulent drainage. liquid stool in colostomy. MSK: all 4 extremities are symmetrical with no cyanosis, clubbing, or edema. No calf edema or TTP bilaterally Skin: warm and dry Psych: A&Ox3 with an appropriate affect.    Lab Results:  Recent Labs    02/08/22 0606 02/09/22 0610  WBC 25.7* 29.9*  HGB 10.7* 11.0*  HCT 33.0* 34.1*  PLT 622* 642*    BMET Recent Labs    02/08/22 0606 02/09/22 0610  NA 139 140  K 3.6 3.6  CL 105 107  CO2 26 25  GLUCOSE 162* 173*  BUN 50* 50*  CREATININE 1.39* 1.50*  CALCIUM 8.3* 8.3*    PT/INR No results for input(s): LABPROT, INR in the last 72 hours. CMP     Component Value Date/Time   NA 140 02/09/2022 0610   NA 139 12/31/2021 1127   K 3.6 02/09/2022 0610   CL 107 02/09/2022 0610   CO2 25 02/09/2022 0610   GLUCOSE 173 (H) 02/09/2022 0610   BUN 50 (H) 02/09/2022 0610   BUN 38 (H) 12/31/2021 1127   CREATININE 1.50 (H)  02/09/2022 0610   CREATININE 1.58 (H) 11/18/2021 1149   CALCIUM 8.3 (L) 02/09/2022 0610   PROT 5.4 (L) 02/09/2022 0610   PROT 7.2 12/31/2021 1127   ALBUMIN 1.5 (L) 02/09/2022 0610   ALBUMIN 3.9 12/31/2021 1127   AST 108 (H) 02/09/2022 0610   AST 44 (H) 11/18/2021 1149   ALT 94 (H) 02/09/2022 0610   ALT 45 (H) 11/18/2021 1149   ALKPHOS 207 (H) 02/09/2022 0610   BILITOT 0.8 02/09/2022 0610   BILITOT 0.3 12/31/2021 1127   BILITOT 0.3 11/18/2021 1149   GFRNONAA 47 (L) 02/09/2022 0610   GFRNONAA 44 (L) 11/18/2021 1149   GFRAA >60 11/24/2019 1600   Lipase     Component Value Date/Time   LIPASE 42 01/28/2022 1500       Studies/Results: DG Chest Port 1 View  Result Date: 02/09/2022 CLINICAL DATA:  Leukocytosis, pancreatic cancer EXAM: PORTABLE CHEST 1 VIEW COMPARISON:  02/04/2022 FINDINGS: Right-sided chest port remains in place. Enteric tube has been removed. Stable heart size. Multiple pulmonary nodules and masses throughout both lungs. Moderate sized left pleural effusion. Trace right pleural effusion. No pneumothorax. IMPRESSION: 1. Moderate left and trace right pleural effusions. 2. Multiple pulmonary nodules and masses throughout both lungs compatible with known metastatic disease. Electronically Signed   By: Davina Poke D.O.   On: 02/09/2022 12:33    Anti-infectives: Anti-infectives (From admission, onward)    Start  Dose/Rate Route Frequency Ordered Stop   02/04/22 1545  piperacillin-tazobactam (ZOSYN) IVPB 3.375 g        3.375 g 12.5 mL/hr over 240 Minutes Intravenous Every 8 hours 02/04/22 1453 02/09/22 1359   02/01/22 2200  ceFAZolin (ANCEF) IVPB 1 g/50 mL premix  Status:  Discontinued        1 g 100 mL/hr over 30 Minutes Intravenous Every 8 hours 02/01/22 1504 02/04/22 1453   02/01/22 0930  cefoTEtan (CEFOTAN) 2 g in sodium chloride 0.9 % 100 mL IVPB        2 g 200 mL/hr over 30 Minutes Intravenous On call to O.R. 01/31/22 1908 02/01/22 1533   01/30/22 1000   cefTRIAXone (ROCEPHIN) 1 g in sodium chloride 0.9 % 100 mL IVPB  Status:  Discontinued        1 g 200 mL/hr over 30 Minutes Intravenous Every 24 hours 01/29/22 0013 02/01/22 1504   01/28/22 1830  cefTRIAXone (ROCEPHIN) 1 g in sodium chloride 0.9 % 100 mL IVPB        1 g 200 mL/hr over 30 Minutes Intravenous  Once 01/28/22 1828 01/28/22 1927        Assessment/Plan Stage IV pancreatic cancer with psbo  - POD9 s/p Lap LOA, Small bowel resection, excisional biopsy of mesentery mass by Dr. 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  - CT scan 2/21 without intra abdominal abscess or obstruction. Some free fluid in the pelvis and RUQ postoperatively. Enlarging left pleural effusion and new right pleural effusion. Do not think something intraabdominal is driving his leukocytosis - NGT out and tolerating FLD. Having colostomy output. Advance to soft - continue TPN, consult RD for calorie count and consider weaning TPN once tolerating soft - PT/OT - recc snf with possible progression to [facility]  FEN: SOFT, ensure. CC. TPN ID: cefotetan periop, ancef (UTI), zosyn 2/17>> VTE: lovenox  E coli UTI CKD 3A HTN   LOS: 12 days   Winferd Humphrey, [facility] Surgery 02/10/2022, 7:56 AM Please see Amion for pager number during day hours 7:00am-4:30pm

## 2022-02-10 NOTE — NC FL2 (Signed)
[place] LEVEL OF CARE SCREENING TOOL     IDENTIFICATION  Patient Name: Kevin Villegas Birthdate: Jul 01, 1941 Sex: male Admission Date (Current Location): 01/28/2022  [facility] and [place] Number:  Herbalist and Address:  [facility],  [place], [place]      Provider Number: [id number]  Attending Physician Name and Address:  Antonieta Pert, MD  Relative Name and Phone Number:       Current Level of Care: Hospital Recommended Level of Care: [place] Prior Approval Number:    Date Approved/Denied:   PASRR Number: [id number]  Discharge Plan: SNF    Current Diagnoses: Patient Active Problem List   Diagnosis Date Noted   Goals of care, counseling/discussion 02/10/2022   Pleural effusion, left 02/10/2022   E-coli UTI 02/01/2022   SBO (small bowel obstruction) ([place]) 01/28/2022   Subcutaneous nodule of abdominal wall 01/09/2022   Partial small bowel obstruction ([place]) 01/07/2022   COVID-19 virus infection 01/07/2022   Colostomy in place [facility]) 11/10/2021   Stage 3a chronic kidney disease (CKD) ([place]) 11/10/2021   Pyelonephritis    Essential hypertension 10/30/2021   Hydronephrosis of left kidney 10/28/2021   Ureteral stricture, left 10/27/2021   Protein-calorie malnutrition, severe 06/12/2021   Hydroureter on right 06/10/2021   Aortic atherosclerosis ([place]) 06/10/2021   Leukocytosis 06/02/2021   Right ureteral stone 06/02/2021   Colon stricture ([place])    Pancreatic mass 06/27/2019   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)M1  with metastatic disease 06/27/2019   Rhinitis medicamentosa 02/26/2019    Orientation RESPIRATION BLADDER Height & Weight     Self, Situation, Time, Place  Normal Continent Weight: 70.4 kg Height:  5\' 11"  (180.3 cm)  BEHAVIORAL SYMPTOMS/MOOD NEUROLOGICAL BOWEL NUTRITION STATUS      Colostomy Diet (soft)  AMBULATORY STATUS COMMUNICATION OF NEEDS Skin   Extensive Assist  Verbally Normal                       Personal Care Assistance Level of Assistance  Bathing, Feeding, Dressing Bathing Assistance: Limited assistance Feeding assistance: Limited assistance Dressing Assistance: Limited assistance     Functional Limitations Info  Sight, Hearing, Speech Sight Info: Impaired Hearing Info: Adequate Speech Info: Adequate    SPECIAL CARE FACTORS FREQUENCY  PT (By licensed PT), OT (By licensed OT)     PT Frequency: 5x/week OT Frequency: 5x/week            Contractures Contractures Info: Not present    Additional Factors Info  Code Status, Allergies Code Status Info: DNR Allergies Info: Pollen extract           Current Medications (02/10/2022):  This is the current hospital active medication list Current Facility-Administered Medications  Medication Dose Route Frequency Provider Last Rate Last Admin   0.9 %  sodium chloride infusion   Intravenous PRN Michael Boston, MD   Stopped at 01/28/22 2301   acetaminophen (TYLENOL) tablet 650 mg  650 mg Oral Q6H PRN Clovis Riley, MD       alum & mag hydroxide-simeth (MAALOX/MYLANTA) 200-200-20 MG/5ML suspension 30 mL  30 mL Oral Q6H PRN Michael Boston, MD       antiseptic oral rinse (BIOTENE) solution 15 mL  15 mL Mouth Rinse PRN Florencia Reasons, MD       Chlorhexidine Gluconate Cloth 2 % PADS 6 each  6 each Topical Daily Michael Boston, MD   6 each at 02/10/22 1013  diphenhydrAMINE (BENADRYL) injection 12.5-25 mg  12.5-25 mg Intravenous Q6H PRN Michael Boston, MD       enoxaparin (LOVENOX) injection 40 mg  40 mg Subcutaneous Q24H Donne Hazel, MD   40 mg at 02/09/22 6568   feeding supplement (BOOST / RESOURCE BREEZE) liquid 1 Container  1 Container Oral TID BM Winferd Humphrey, PA-C   1 Container at 02/09/22 2000   feeding supplement (ENSURE ENLIVE / ENSURE PLUS) liquid 237 mL  237 mL Oral BID BM Winferd Humphrey, PA-C       fentaNYL (SUBLIMAZE) injection 25-50 mcg  25-50 mcg Intravenous Q1H PRN  Michael Boston, MD   50 mcg at 02/09/22 1932   insulin aspart (novoLOG) injection 0-15 Units  0-15 Units Subcutaneous Q6H Reginia Naas, RPH   3 Units at 02/10/22 0543   iohexol (OMNIPAQUE) 300 MG/ML solution 100 mL  100 mL Intravenous Once PRN Winferd Humphrey, PA-C       lidocaine (LIDODERM) 5 % 1 patch  1 patch Transdermal Q24H Winferd Humphrey, PA-C   1 patch at 02/09/22 1223   lidocaine (XYLOCAINE) 1 % (with pres) injection            lip balm (CARMEX) ointment 1 application  1 application Topical BID Michael Boston, MD   1 application at 12/75/17 1013   menthol-cetylpyridinium (CEPACOL) lozenge 3 mg  1 lozenge Oral PRN Michael Boston, MD   3 mg at 02/03/22 1435   methocarbamol (ROBAXIN) tablet 500 mg  500 mg Oral QID Winferd Humphrey, PA-C   500 mg at 02/10/22 1013   multivitamin with minerals tablet 1 tablet  1 tablet Oral Daily Eudelia Bunch, RPH   1 tablet at 02/10/22 1013   ondansetron (ZOFRAN) injection 4 mg  4 mg Intravenous Q6H PRN Clovis Riley, MD       oxyCODONE (Oxy IR/ROXICODONE) immediate release tablet 5 mg  5 mg Oral Q4H PRN Clovis Riley, MD       phenol (CHLORASEPTIC) mouth spray 2 spray  2 spray Mouth/Throat PRN Michael Boston, MD       prochlorperazine (COMPAZINE) injection 5-10 mg  5-10 mg Intravenous Q4H PRN Michael Boston, MD       simethicone (MYLICON) 40 GY/1.7CB suspension 80 mg  80 mg Oral QID PRN Michael Boston, MD       sodium chloride flush (NS) 0.9 % injection 10-40 mL  10-40 mL Intracatheter PRN Michael Boston, MD       sodium chloride flush (NS) 0.9 % injection 3 mL  3 mL Intravenous Gorden Harms, MD   3 mL at 02/09/22 2109   TPN ADULT (ION)   Intravenous Continuous TPN Lynelle Doctor, RPH 85 mL/hr at 02/10/22 1015 Infusion Verify at 02/10/22 1015   TPN ADULT (ION)   Intravenous Continuous TPN Eudelia Bunch, [facility]         Discharge Medications: Please see discharge summary for a list of discharge medications.  Relevant Imaging  Results:  Relevant Lab Results:   Additional Information SS# [id number]; covid vaccine - J&J x1, Moderna x1  Tawanna Cooler, RN

## 2022-02-10 NOTE — Progress Notes (Signed)
Nutrition Note  RD consulted for nutritional assessment and to start 48 hr Calorie Count.  Continues to receive TPN at goal rate of 85 ml/hr, providing 2061 kcals and 112g protein.  Diet now advanced to soft diet. Patient has been drinking Boost Breeze supplements but states he would prefer Ensure either strawberry (currently OOS) or vanilla.  Encouraged pt to try and drink at least 2 Ensures today. Explained that we are monitoring his intakes. Pt understood.   Day 1 results of calorie count will be available 2/24.  Clayton Bibles, MS, RD, LDN Inpatient Clinical Dietitian Contact information available via Amion

## 2022-02-10 NOTE — Progress Notes (Signed)
Daily Progress Note   Patient Name: Kevin Villegas       Date: 02/10/2022 DOB: [birth year]  Age: 81 y.o. MRN#: [id number] Attending Physician: Antonieta Pert, MD Primary Care Physician: Dettinger, [name], MD Admit Date: 01/28/2022  Reason for Consultation/Follow-up: Establishing goals of care  Subjective: I saw and examined [name] this morning.  He reports feeling "a little stronger."  States he continues to work to increase his intake and is tolerating full liquid diet.  Discussed that creatinine has started to trend back down but he continues to have elevation in climbing of WBCs.  Discussed with [name] that I talked about this with Dr. Lupita Villegas and still of unclear etiology.  He expressed understanding.  Continues to state, "we will have to see."    Length of Stay: 12  Current Medications: Scheduled Meds:   Chlorhexidine Gluconate Cloth  6 each Topical Daily   enoxaparin (LOVENOX) injection  40 mg Subcutaneous Q24H   feeding supplement  1 Container Oral TID BM   feeding supplement  237 mL Oral BID BM   insulin aspart  0-15 Units Subcutaneous Q6H   lidocaine  1 patch Transdermal Q24H   lip balm  1 application Topical BID   methocarbamol  500 mg Oral QID   multivitamin with minerals  1 tablet Oral Daily   sodium chloride flush  3 mL Intravenous Q12H    Continuous Infusions:  sodium chloride Stopped (01/28/22 2301)   TPN ADULT (ION) 85 mL/hr at 02/10/22 1015   TPN ADULT (ION)      PRN Meds: sodium chloride, acetaminophen, alum & mag hydroxide-simeth, antiseptic oral rinse, diphenhydrAMINE, fentaNYL (SUBLIMAZE) injection, iohexol, menthol-cetylpyridinium, ondansetron (ZOFRAN) IV, oxyCODONE, phenol, prochlorperazine, simethicone, sodium chloride flush  Physical Exam          Appears chronically ill.  Resting in bed NG tube out No edema Awake alert S 1 S 2   Vital Signs: BP 140/85 (BP Location: Right Arm)    Pulse 84    Temp 98.2 F (36.8 C) (Oral)    Resp 16    Ht 5\' 11"  (1.803 m)    Wt 70.4 kg    SpO2 94%    BMI 21.65 kg/m  SpO2: SpO2: 94 % O2 Device: O2 Device: Room Air O2 Flow Rate: O2 Flow Rate (L/min):  2 L/min  Intake/output summary:  Intake/Output Summary (Last 24 hours) at 02/10/2022 1047 Last data filed at 02/10/2022 1015 Gross per 24 hour  Intake 4555.61 ml  Output 1550 ml  Net 3005.61 ml    LBM: Last BM Date : 02/08/22 Baseline Weight: Weight: 57 kg Most recent weight: Weight: 70.4 kg       Palliative Assessment/Data:      Patient Active Problem List   Diagnosis Date Noted   Goals of care, counseling/discussion 02/10/2022   E-coli UTI 02/01/2022   SBO (small bowel obstruction) ([place]) 01/28/2022   Subcutaneous nodule of abdominal wall 01/09/2022   Partial small bowel obstruction ([place]) 01/07/2022   COVID-19 virus infection 01/07/2022   Colostomy in place [facility]) 11/10/2021   Stage 3a chronic kidney disease (CKD) ([place]) 11/10/2021   Pyelonephritis    Essential hypertension 10/30/2021   Hydronephrosis of left kidney 10/28/2021   Ureteral stricture, left 10/27/2021   Protein-calorie malnutrition, severe 06/12/2021   Hydroureter on right 06/10/2021   Aortic atherosclerosis ([place]) 06/10/2021   Leukocytosis 06/02/2021   Right ureteral stone 06/02/2021   Colon stricture ([place])    Pancreatic mass 06/27/2019   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)M1  with metastatic disease 06/27/2019   Rhinitis medicamentosa 02/26/2019    Palliative Care Assessment & Plan   Patient Profile:    Assessment:  81 year old gentleman who lives at home with his wife in [place], [place].  His daughter lives nearby.  He has a past medical history of stage IV pancreatic cancer, status post colectomy, hydronephrosis status post  nephrostomy, underlying hypertension small bowel obstruction stage II chronic kidney disease.  Patient presented with ongoing abdominal pain, known to have concerns for small bowel obstruction issues.  Underwent a diagnostic laparoscopy with lysis of additions on 2-14.  He is being followed by general surgery and medical oncology.  Has been placed on TPN for nutritional support.  Has been placed on NG tube for suction.  So far, patient has not not had any bowel movements or passed flatus. Palliative medicine team has been consulted for CODE STATUS and goals of care discussions and for additional support.  Recommendations/Plan: DNR Continue current mode of care.  Unclear etiology of increased WBCs.   He continues to progress his diet tolerance. He understands need to improve both nutrition and functional status before there could even be consideration for systemic therapy for his cancer but this remains his ultimate goal.  Code Status:    Code Status Orders  (From admission, onward)           Start     Ordered   02/06/22 1335  Do not attempt resuscitation (DNR)  Continuous       Question Answer Comment  In the event of cardiac or respiratory ARREST Do not call a code blue   In the event of cardiac or respiratory ARREST Do not perform Intubation, CPR, defibrillation or ACLS   In the event of cardiac or respiratory ARREST Use medication by any route, position, wound care, and other measures to relive pain and suffering. May use oxygen, suction and manual treatment of airway obstruction as needed for comfort.      02/06/22 1334           Code Status History     Date Active Date Inactive Code Status Order ID Comments User Context   01/29/2022 0014 02/06/2022 1334 Full Code [id number]  Marcelyn Bruins, MD Inpatient   01/07/2022 2129 01/10/2022 2258  Full Code [id number]  Rise Patience, MD Inpatient   12/10/2021 1331 12/11/2021 2137 Full Code [id number]  Jonnie Finner, DO ED    11/10/2021 0354 11/13/2021 2039 Full Code [id number]  Chotiner, Yevonne Aline, MD Inpatient   10/27/2021 2317 10/30/2021 1833 Full Code [id number]  Lenore Cordia, MD Inpatient   06/09/2021 2201 06/18/2021 1923 Full Code [id number]  Lenore Cordia, MD Inpatient   06/01/2021 2102 06/05/2021 1830 Full Code [id number]  Rhetta Mura, DO ED   05/18/2021 1200 05/26/2021 1958 Partial Code [id number]  Jonnie Finner, DO Inpatient   12/12/2014 1730 12/17/2014 1704 Full Code [id number]  Velvet Bathe, MD Inpatient       Prognosis:  Guarded   Discharge Planning: To Be Determined  Care plan was discussed with patient.  Thank you for allowing the Palliative Medicine Team to assist in the care of this patient. Micheline Rough, MD  Please contact Palliative Medicine Team phone at [phone] for questions and concerns.

## 2022-02-10 NOTE — Progress Notes (Signed)
PROGRESS NOTE Kevin Villegas  PZW:[id number] DOB: [birth year] DOA: 01/28/2022 PCP: Dettinger, [name], MD   Brief Narrative/Hospital Course: 81 year old male with stage IV pancreatic cancer status post colectomy, hydronephrosis status post left nephrostomy, hypertension, SBO, CKD stage III who presented with abdominal pain, found to have small bowel obstruction status post diagnostic laparoscopy with lysis of adhesion on 2/40 measuring NG suction n.p.o. while waiting for return of bowel function.  Followed by surgery oncology.  Hospital course complicated by coffee-ground colored secretion from NG tube, leukocytosis. -Repeat CT scan 2/20 no bowel obstruction, no abscess moderate amount of free fluid in the right upper quadrant, body wall edema noted to the right anterior wall. With persistent leukocytosis but afebrile, being managed with TPN, IV antibiotics.    Subjective: Seen and examined this morning.  Patient is alert awake not in distress Has no complaint. Overnight afebrile Tmax 98.2 blood pressure stable, on room air Labs -came back with worsening WBC count stable renal function Urine output 1.7 L with nephrostomy  Assessment and Plan: * Partial small bowel obstruction ([place])- (present on admission) Stage IV pancreatic cancer with partial small bowel obstruction: S/p LOA, small bowel resection, excisional biopsy of mesenteric mass Dr. [name] on 2/14.  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 CT scan 2/21 without intra-abdominal abscess or obstruction, enlarging left pleural effusion and new right pleural effusion. Being managed by surgery on board.  Off NG tube 2/21, tolerating clears having colostomy output, advancing diet as tolerated as per surgery, on TPN and slowly weaning.  Now with worsening leukocytosis. Encourage OOB PT OT ambulation.   On Zosyn ( 2/17>>2/22) ID consulted for persistent leukocytosis   Hydronephrosis of left  kidney- (present on admission) Due to tumor compression, status post left nephrostomy last changed on 1/30.  Discussed with urology previously- recommended renal ultrasound on 2/17 that showed chronic right hydronephrosis with atrophic right kidney advise outpatient follow-up, seen on consult by urology 2/18.  Nephrostomy draining.  Renal function is stable   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)M1  with metastatic disease- (present on admission) See #1.Oncology on board.  Sent message to Dr. Marin Olp regarding his his WBC count  E-coli UTI Urine culture with pansensitive E. Coli and completed antibiotic  Colostomy in place [facility]) Having output.  Tolerating diet, supportive care  Pleural effusion, left Pleural effusion left more than right.?  Infected effusion in the setting of abdominal infection.  Will order IR thoracentesis and pleural fluid analysis.  He is not symptomatic with shortness of breath or hypoxia.  Goals of care, counseling/discussion Palliative care on board,  appreciate input.  Patient is DNR, plan is to follow-up palliative care at the facility  Stage 3a chronic kidney disease (CKD) ([place])- (present on admission) Slightly AKI with creatinine at 1.59 improved to 1.2 with extra additional IV fluids.  Continue TPN and wean as tolerated.  Monitor Recent Labs  Lab 02/06/22 1115 02/07/22 0517 02/08/22 0606 02/09/22 0610 02/10/22 0812  BUN 45* 45* 50* 50* 51*   50*  CREATININE 1.28* 1.27* 1.39* 1.50* 1.23   1.20    Essential hypertension- (present on admission) BP stable.Not on meds.  Protein-calorie malnutrition, severe- (present on admission) Augment nutritional status as tolerated.  Dietitian on board. Nutrition Status: Nutrition Problem: Severe Malnutrition Etiology: chronic illness, cancer and cancer related treatments Signs/Symptoms: severe fat depletion, severe muscle depletion, percent weight loss Interventions:  TPN    Leukocytosis Leukocytosis further worsening but clinically afebrile,unclear etiology,But  has UTI and post op so on emperic antibiotics with Zosyn completed 2/22, chest x-ray with pleural effusion we will order IR thoracentesis and fluid analysis, ID has been consulted.  We will wait for oncology input as well. Recent Labs  Lab 02/05/22 0822 02/07/22 0517 02/08/22 0606 02/09/22 0610 02/10/22 0812  WBC 22.8* 23.5* 25.7* 29.9* 38.6*    DVT prophylaxis: enoxaparin (LOVENOX) injection 40 mg Start: 02/02/22 1000 SCDs Start: 01/29/22 0013 Code Status:   Code Status: DNR Family Communication: plan of care discussed with patient/ wife on Phone 2/21. Tried calling daughter no answer 2/23. Called his spouse and updated HER.  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   Disposition: Currently  not medically stable for discharge. Status is: Inpatient Remains inpatient appropriate because: ongoing management of SBO  Objective: Vitals last 24 hrs: Vitals:   02/09/22 1002 02/09/22 1311 02/09/22 2111 02/10/22 0518  BP: 116/83 118/69 (!) 155/88 140/85  Pulse: 73 84 81 84  Resp: 17 19 17 16   Temp: 97.8 F (36.6 C) 97.8 F (36.6 C) 97.7 F (36.5 C) 98.2 F (36.8 C)  TempSrc: Oral Oral Oral Oral  SpO2: 93%  93% 94%  Weight:    70.4 kg  Height:       Weight change: 0.6 kg  Physical Examination: General exam:AA,oriented,Interacting well, on room air not in distress.  Ill looking, older than stated age, weak appearing. HEENT:Oral mucosa moist, Ear/Nose WNL grossly, dentition normal. Respiratory system:Bilaterally diminished, no use of accessory muscle Cardiovascular system:S1 & S2 +, No JVD. Gastrointestinal system:Abdomen soft,NT,ND,BS+,colostomy in place with a stool output,Nephrostomy drain in place. Nervous System:Alert, awake, moving extremities and grossly nonfocal Extremities: LE ankle edema none, distal peripheral pulses  palpable.  Skin: No rashes,no icterus. MSK: thin muscle bulk,tone, power.   Medications reviewed:  Scheduled Meds:  Chlorhexidine Gluconate Cloth  6 each Topical Daily   enoxaparin (LOVENOX) injection  40 mg Subcutaneous Q24H   feeding supplement  1 Container Oral TID BM   feeding supplement  237 mL Oral BID BM   insulin aspart  0-15 Units Subcutaneous Q6H   lidocaine  1 patch Transdermal Q24H   lip balm  1 application Topical BID   methocarbamol  500 mg Oral QID   multivitamin with minerals  1 tablet Oral Daily   sodium chloride flush  3 mL Intravenous Q12H   Continuous Infusions:  sodium chloride Stopped (01/28/22 2301)   TPN ADULT (ION) 85 mL/hr at 02/10/22 1015   TPN ADULT (ION)        Diet Order             DIET SOFT Room service appropriate? Yes; Fluid consistency: Thin  Diet effective now                    Nutrition Problem: Severe Malnutrition Etiology: chronic illness, cancer and cancer related treatments Signs/Symptoms: severe fat depletion, severe muscle depletion, percent weight loss Interventions: TPN   Intake/Output Summary (Last 24 hours) at 02/10/2022 1116 Last data filed at 02/10/2022 1103 Gross per 24 hour  Intake 4435.61 ml  Output 2025 ml  Net 2410.61 ml   Net IO Since Admission: 6,920.8 mL [02/10/22 1116]  Wt Readings from Last 3 Encounters:  02/10/22 70.4 kg  01/07/22 63.5 kg  12/31/21 63.5 kg     Unresulted Labs (From admission, onward)     Start     Ordered   02/11/22 0500  Procalcitonin  Daily,   R     Question:  Specimen collection method  Answer:  Lab=Lab collect   02/10/22 0954   02/10/22 0955  Procalcitonin - Baseline  Add-on,   AD       Question:  Specimen collection method  Answer:  Lab=Lab collect   02/10/22 0954   02/07/22 0500  CBC  Every Mon-Wed-Fri (0500),   R     Question:  Specimen collection method  Answer:  Lab=Lab collect   02/06/22 1432   01/31/22 0500  Comprehensive metabolic panel  (TPN Lab Panel)  Every  Mon,Thu (0500),   R     Question:  Specimen collection method  Answer:  Unit=Unit collect   01/30/22 0742   01/31/22 0500  Magnesium  (TPN Lab Panel)  Every Mon,Thu (0500),   R     Question:  Specimen collection method  Answer:  Unit=Unit collect   01/30/22 0742   01/31/22 0500  Phosphorus  (TPN Lab Panel)  Every Mon,Thu (0500),   R     Question:  Specimen collection method  Answer:  Unit=Unit collect   01/30/22 0742   01/31/22 0500  Triglycerides  (TPN Lab Panel)  Every Monday (0500),   R     Question:  Specimen collection method  Answer:  Unit=Unit collect   01/30/22 0742          Data Reviewed: I have personally reviewed following labs and imaging studies CBC: Recent Labs  Lab 02/05/22 0822 02/07/22 0517 02/08/22 0606 02/09/22 0610 02/10/22 0812  WBC 22.8* 23.5* 25.7* 29.9* 38.6*  HGB 10.4* 10.6* 10.7* 11.0* 10.7*  HCT 34.1* 33.1* 33.0* 34.1* 33.7*  MCV 100.3* 93.5 93.0 93.7 95.5  PLT 449* 541* 622* 642* 916*   Basic Metabolic Panel: Recent Labs  Lab 02/04/22 0531 02/05/22 0822 02/06/22 1115 02/07/22 0517 02/08/22 0606 02/09/22 0610 02/10/22 0812  NA 133* 135 139 140 139 140 143   142  K 4.0 3.3* 3.3* 3.4* 3.6 3.6 4.0   4.0  CL 102 103 107 106 105 107 112*   112*  CO2 25 24 25 25 26 25 24   23   GLUCOSE 159* 144* 162* 193* 162* 173* 160*   160*  BUN 40* 40* 45* 45* 50* 50* 51*   50*  CREATININE 1.31* 1.21 1.28* 1.27* 1.39* 1.50* 1.23   1.20  CALCIUM 8.6* 8.5* 8.4* 8.6* 8.3* 8.3* 8.3*   8.2*  MG 1.9 2.0  --  2.2  --  2.3 2.3  PHOS 3.3  --   --  3.6  --  4.0 3.6   GFR: Estimated Creatinine Clearance: 47.7 mL/min (by C-G formula based on SCr of 1.23 mg/dL). Liver Function Tests: Recent Labs  Lab 02/07/22 0517 02/08/22 0606 02/09/22 0610 02/10/22 0812  AST 207* 142* 108* 102*  ALT 122* 114* 94* 91*  ALKPHOS 218* 245* 207* 204*  BILITOT 0.6 0.6 0.8 0.7  PROT 5.7* 5.4* 5.4* 5.8*  ALBUMIN 1.7* 1.6* 1.5* 1.6*   No results for input(s): LIPASE, AMYLASE in  the last 168 hours. No results for input(s): AMMONIA in the last 168 hours. Coagulation Profile: No results for input(s): INR, PROTIME in the last 168 hours. Cardiac Enzymes: No results for input(s): CKTOTAL, CKMB, CKMBINDEX, TROPONINI in the last 168 hours. BNP (last 3 results) No results for input(s): PROBNP in the last 8760 hours. HbA1C: No results for input(s): HGBA1C in the last 72 hours. CBG: Recent Labs  Lab 02/09/22 [phone]  02/09/22 1157 02/09/22 1728 02/09/22 2302 02/10/22 0519  GLUCAP 165* 169* 122* 168* 171*   Lipid Profile: No results for input(s): CHOL, HDL, LDLCALC, TRIG, CHOLHDL, LDLDIRECT in the last 72 hours.  Thyroid Function Tests: No results for input(s): TSH, T4TOTAL, FREET4, T3FREE, THYROIDAB in the last 72 hours. Anemia Panel: No results for input(s): VITAMINB12, FOLATE, FERRITIN, TIBC, IRON, RETICCTPCT in the last 72 hours. Sepsis Labs: No results for input(s): PROCALCITON, LATICACIDVEN in the last 168 hours.   Recent Results (from the past 240 hour(s))  Culture, blood (routine x 2)     Status: None   Collection Time: 02/04/22  3:42 PM   Specimen: BLOOD  Result Value Ref Range Status   Specimen Description BLOOD LEFT ANTECUBITAL  Final   Special Requests   Final    BOTTLES DRAWN AEROBIC AND ANAEROBIC Blood Culture adequate volume   Culture   Final    NO GROWTH 5 DAYS Performed at [facility], [address], [place], [place] [address]    Report Status 02/09/2022 FINAL  Final  Culture, blood (routine x 2)     Status: None   Collection Time: 02/04/22  3:42 PM   Specimen: BLOOD  Result Value Ref Range Status   Specimen Description BLOOD RIGHT ANTECUBITAL  Final   Special Requests   Final    BOTTLES DRAWN AEROBIC ONLY Blood Culture results may not be optimal due to an inadequate volume of blood received in culture bottles   Culture   Final    NO GROWTH 5 DAYS Performed at [facility], [place] [address] [place], [place] [address]     Report Status 02/09/2022 FINAL  Final    Antimicrobials: Anti-infectives (From admission, onward)    Start     Dose/Rate Route Frequency Ordered Stop   02/04/22 1545  piperacillin-tazobactam (ZOSYN) IVPB 3.375 g        3.375 g 12.5 mL/hr over 240 Minutes Intravenous Every 8 hours 02/04/22 1453 02/09/22 1359   02/01/22 2200  ceFAZolin (ANCEF) IVPB 1 g/50 mL premix  Status:  Discontinued        1 g 100 mL/hr over 30 Minutes Intravenous Every 8 hours 02/01/22 1504 02/04/22 1453   02/01/22 0930  cefoTEtan (CEFOTAN) 2 g in sodium chloride 0.9 % 100 mL IVPB        2 g 200 mL/hr over 30 Minutes Intravenous On call to O.R. 01/31/22 1908 02/01/22 1533   01/30/22 1000  cefTRIAXone (ROCEPHIN) 1 g in sodium chloride 0.9 % 100 mL IVPB  Status:  Discontinued        1 g 200 mL/hr over 30 Minutes Intravenous Every 24 hours 01/29/22 0013 02/01/22 1504   01/28/22 1830  cefTRIAXone (ROCEPHIN) 1 g in sodium chloride 0.9 % 100 mL IVPB        1 g 200 mL/hr over 30 Minutes Intravenous  Once 01/28/22 1828 01/28/22 1927      Culture/Microbiology    Component Value Date/Time   SDES BLOOD LEFT ANTECUBITAL 02/04/2022 1542   SDES BLOOD RIGHT ANTECUBITAL 02/04/2022 1542   SPECREQUEST  02/04/2022 1542    BOTTLES DRAWN AEROBIC AND ANAEROBIC Blood Culture adequate volume   SPECREQUEST  02/04/2022 1542    BOTTLES DRAWN AEROBIC ONLY Blood Culture results may not be optimal due to an inadequate volume of blood received in culture bottles   CULT  02/04/2022 1542    NO GROWTH 5 DAYS Performed at [facility], [address] [place], [place] [address]    CULT  02/04/2022 1542    NO GROWTH 5 DAYS Performed at [facility] [address] [place], [place] [address]    REPTSTATUS 02/09/2022 FINAL 02/04/2022 1542   REPTSTATUS 02/09/2022 FINAL 02/04/2022 1542  Radiology Studies: [facility] Chest Port 1 View  Result Date: 02/09/2022 CLINICAL DATA:  Leukocytosis, pancreatic cancer EXAM: PORTABLE CHEST 1 VIEW  COMPARISON:  02/04/2022 FINDINGS: Right-sided chest port remains in place. Enteric tube has been removed. Stable heart size. Multiple pulmonary nodules and masses throughout both lungs. Moderate sized left pleural effusion. Trace right pleural effusion. No pneumothorax. IMPRESSION: 1. Moderate left and trace right pleural effusions. 2. Multiple pulmonary nodules and masses throughout both lungs compatible with known metastatic disease. Electronically Signed   By: Davina Poke D.O.   On: 02/09/2022 12:33     LOS: 12 days   Antonieta Pert, MD [facility]  02/10/2022, 11:16 AM

## 2022-02-10 NOTE — Progress Notes (Signed)
PHARMACY - TOTAL PARENTERAL NUTRITION CONSULT NOTE   Indication: Small bowel obstruction and inability to meet nutritional needs w/ PO intake  Patient Measurements: Height: _0  (180.3 cm) Weight: 70.4 kg (155 lb 3.3 oz) IBW/kg (Calculated) : 75.3 TPN AdjBW (KG): 57.7 Body mass index is 21.65 kg/m. Usual Weight: Weight noted in November 2022 to be 63.5 kg  Assessment: 81 yo male presenting to [facility] ED for abdominal pain, nausea, vomiting. PMH includes pancreatic cancer with evidence of metastatic disease to his bladder, lungs, and pleural space and history of SBO.  S/p stent placement between stomach and intestines and colostomy placement in June 2022. Pharmacy is consulted to begin TPN for possible bridge to surgery given low pre-albumin and inability to meet nutritional needs w/ PO intake.   Glucose / Insulin: No hx DM;  - mSS q6h (used 9 units in 24 hrs) - cbgs (goal <150): 122- 171 Electrolytes: all WNL Renal: SCr 1.5> 1.23  (crcl~48); BUN elevated but stable at 50 Hepatic: AST/ALT remain slightly elevated, Alk phos elevated; Tbili wnl -  Pre-albumin low at 8.2 (2/11); albumin low at 1.6. TG 119 (2/20) Intake / Output; MIVF:  - I/O: +2015 ml - UOP: 1700 ml - NGT out 2/21 - Stool 75 ml, down from 600 ml 2/21 NS 50 mlhr> DC 2/22 2330 GI Imaging: - 2/10 CT abdomen pelvis: re-demonstration of high-grade partial SBO with transition point in the upper left pelvis psych likely due to adhesion.  - 2/20 abd CT: No evidence of postoperative intra-abdominal abscess. BL pleural effusions. Pulmonary metastatic disease in the lung bases with multiple pulmonary nodules. GI Surgeries / Procedures:  - 2/14: laparoscopic lysis of adhesions, small bowel resection, excisional bipsy of mesentery mass   Central access: PAC TPN start date: 2/12  Nutritional Goals: Goal TPN rate is 85 mL/hr (provides 112g of protein and 2061 kcals per day)  RD Assessment:  Estimated Needs Total Energy Estimated  Needs: 2050-2250 kcal Total Protein Estimated Needs: 105-120 grams Total Fluid Estimated Needs: >/= 2 L/day  Current Nutrition:  Soft diet Boost Breeze TID, each provides 250 kcal and 9 grams of protein Ensure plus BID TPN  Plan:  Diet advanced to soft by CCS At 1800: - Continue TPN at goal rate of 35m/hr - Electrolytes in TPN:  Na 1028m/L K 3044mL Ca 2mE101m Mg 5 mEq/L Phos 10mm65m Cl:Ac 1:1 - change MVI to PO   - continue  moderate SSI q6h and adjust as needed. - TPN labs on Mon/Thurs - f/u tolerance of PO diet and ability to wean TPN   MicheEudelia [email or web address] 02/10/2022 9:31 AM

## 2022-02-10 NOTE — TOC Progression Note (Signed)
Transition of Care [facility]) - Progression Note    Patient Details  Name: Kevin Villegas MRN: [id number] Date of Birth: Dec 27, 1940  Transition of Care [facility]) CM/SW Contact  Tawanna Cooler, RN Phone Number: 02/10/2022, 1:28 PM  Clinical Narrative:     [facility] faxed out. TOC will follow up with bed offers.    Expected Discharge Plan: Skilled Nursing Facility Barriers to Discharge: Continued Medical Work up  Expected Discharge Plan and Services Expected Discharge Plan: [place]   Discharge Planning Services: CM Consult                                           Social Determinants of Health (SDOH) Interventions    Readmission Risk Interventions Readmission Risk Prevention Plan 02/03/2022 06/16/2021 06/11/2021  Transportation Screening Complete Complete Complete  PCP or Specialist Appt within 3-5 Days - - Complete  HRI or Council - - Complete  Social Work Consult for [place] Planning/Counseling - - Complete  Palliative Care Screening - - Not Applicable  Medication Review Press photographer) Complete Complete Complete  PCP or Specialist appointment within 3-5 days of discharge Complete Complete -  [place] or Home Care Consult Complete Complete -  SW Recovery Care/Counseling Consult Complete Complete -  Palliative Care Screening Not Applicable Not Applicable -  [place] Not Applicable Not Applicable -  Some recent data might be hidden

## 2022-02-10 NOTE — Procedures (Signed)
PROCEDURE SUMMARY:  Successful US guided left thoracentesis. Fluid loculated; ordering MD aware.  Yielded 50 ml of light red/purulent/thick fluid. Pt tolerated procedure well. No immediate complications.  Specimen sent for labs. CXR ordered; no post-procedure pneumothorax identified  EBL < 2 mL  Theresa Duty, NP 02/10/2022 2:00 PM

## 2022-02-10 NOTE — Consult Note (Signed)
[place] for Infectious Disease       Reason for Consult: leukocytosis    Referring Physician: Dr. Lupita Leash  Principal Problem:   Partial small bowel obstruction ([place]) Active Problems:   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)M1  with metastatic disease   Leukocytosis   Protein-calorie malnutrition, severe   Hydronephrosis of left kidney   Essential hypertension   Colostomy in place [facility])   Stage 3a chronic kidney disease (CKD) ([place])   E-coli UTI   Goals of care, counseling/discussion    Chlorhexidine Gluconate Cloth  6 each Topical Daily   enoxaparin (LOVENOX) injection  40 mg Subcutaneous Q24H   feeding supplement  1 Container Oral TID BM   feeding supplement  237 mL Oral BID BM   insulin aspart  0-15 Units Subcutaneous Q6H   lidocaine  1 patch Transdermal Q24H   lip balm  1 application Topical BID   methocarbamol  500 mg Oral QID   multivitamin with minerals  1 tablet Oral Daily   sodium chloride flush  3 mL Intravenous Q12H    Recommendations:  Continue to monitor off of antibiotics  Assessment: He has leukocytosis of unknown etiology.  No obvious signs of infection with no localizing symptoms.  It is possible his pleural effusion and abdominal fluid is infected but he is having no particular pain in the areas or concerns to suggest there is active infection clinically.  Certainly, leukocytosis can be reactive from his underlying metastatic cancer and was rising while on very broad antibiotics suggesting a bacterial infection not present.    Antibiotics: None currently  HPI: Kevin Villegas is a 81 y.o. male with a history of metastatic pancreatic cancer stage IV now POD 9 small bowel resection by Dr. [name] with leukocytosis up to 38.6 today.  WBC has steadily climbed and has been treated perioperatively with cefazolin and recently piperacillin/tazobactam for UTI.  CT scan of his abdomen without any findings of abscess.  He has fluid noted in the right  upper quadrant of the abdomen, some in the pelvis and a left pleural effusion.  He has no fever, no chills.  No localizing symptoms including no cough, no headache, no neck pain, no dental pain, no increased ostomy output, no joint swelling.     Review of Systems:  Constitutional: negative for fevers and chills Musculoskeletal: negative for myalgias and arthralgias All other systems reviewed and are negative    Past Medical History:  Diagnosis Date   Colostomy present ([place])    Large bowel obstruction (H[facility])    Pancreatic cancer ([place])     Social History   Tobacco Use   Smoking status: Never   Smokeless tobacco: Never  Vaping Use   Vaping Use: Never used  Substance Use Topics   Alcohol use: No    Alcohol/week: 0.0 standard drinks   Drug use: No    Family History  Problem Relation Age of Onset   Stroke Mother     Allergies  Allergen Reactions   Pollen Extract Other (See Comments)    Sinus congestion    Physical Exam: Constitutional: in no apparent distress  Vitals:   02/09/22 2111 02/10/22 0518  BP: (!) 155/88 140/85  Pulse: 81 84  Resp: 17 16  Temp: 97.7 F (36.5 C) 98.2 F (36.8 C)  SpO2: 93% 94%   EYES: anicteric ENMT: no mucositis Chest: port in place with no surrounding erythema Cardiovascular: Cor RRR Respiratory: clear; GI: soft Musculoskeletal: no edema  Skin: no rashes Neuro: non-focal  Lab Results  Component Value Date   WBC 38.6 (H) 02/10/2022   HGB 10.7 (L) 02/10/2022   HCT 33.7 (L) 02/10/2022   MCV 95.5 02/10/2022   PLT 771 (H) 02/10/2022    Lab Results  Component Value Date   CREATININE 1.23 02/10/2022   CREATININE 1.20 02/10/2022   BUN 51 (H) 02/10/2022   BUN 50 (H) 02/10/2022   NA 143 02/10/2022   NA 142 02/10/2022   K 4.0 02/10/2022   K 4.0 02/10/2022   CL 112 (H) 02/10/2022   CL 112 (H) 02/10/2022   CO2 24 02/10/2022   CO2 23 02/10/2022    Lab Results  Component Value Date   ALT 91 (H) 02/10/2022   AST 102 (H)  02/10/2022   ALKPHOS 204 (H) 02/10/2022     Microbiology: Recent Results (from the past 240 hour(s))  Culture, blood (routine x 2)     Status: None   Collection Time: 02/04/22  3:42 PM   Specimen: BLOOD  Result Value Ref Range Status   Specimen Description BLOOD LEFT ANTECUBITAL  Final   Special Requests   Final    BOTTLES DRAWN AEROBIC AND ANAEROBIC Blood Culture adequate volume   Culture   Final    NO GROWTH 5 DAYS Performed at [facility], [place] [address] [place], [place] [address]    Report Status 02/09/2022 FINAL  Final  Culture, blood (routine x 2)     Status: None   Collection Time: 02/04/22  3:42 PM   Specimen: BLOOD  Result Value Ref Range Status   Specimen Description BLOOD RIGHT ANTECUBITAL  Final   Special Requests   Final    BOTTLES DRAWN AEROBIC ONLY Blood Culture results may not be optimal due to an inadequate volume of blood received in culture bottles   Culture   Final    NO GROWTH 5 DAYS Performed at [facility], [place] [address], [place], [place] [address]    Report Status 02/09/2022 FINAL  Final    Thayer Headings, [place] for Infectious Disease [email or web address] 02/10/2022, 10:57 AM

## 2022-02-10 NOTE — Assessment & Plan Note (Addendum)
Augment nutritional status as tolerated.  Dietitian on board.  On Soft diet/TPN and wean TPN as tolerated Nutrition Problem: Severe Malnutrition Etiology: chronic illness, cancer and cancer related treatments Signs/Symptoms: severe fat depletion, severe muscle depletion, percent weight loss Interventions: TPN

## 2022-02-10 NOTE — Assessment & Plan Note (Addendum)
S/P thick purulent drainage- 50 ml by IR 2/23- grew Enterococcus, likely seeding previously known loculated left effusion likely malignant. S/p small bore chest tube placement 2/24 Dr Leandra Kern a candidate for surgery. -ID following and started on Zyvox -lytics done 2/25, and placed to suction 2/26-patient has a small left hydropneumothorax. Fluid cytology is negative.  Chest tube removed.  Now comfort care.

## 2022-02-10 NOTE — Progress Notes (Signed)
PT Cancellation Note  Patient Details Name: Demani Mcbrien MRN: [id number] DOB: 05-16-1941   Cancelled Treatment:     Pt scheduled for a Thoracentesis later today.  Pending schedule will attempt to see later today or tomorrow.  Pt has been evaluated with rec for SNF.  Pt is hoping for home.     Rica Koyanagi  PTA Acute  Rehabilitation Services Pager      [phone] Office      [phone]

## 2022-02-10 NOTE — Assessment & Plan Note (Addendum)
Patient with metastatic pancreatic cancer  with complicated course and hospitalization appears ill deconditioned with failure to thrive.Remains DNR.   Palliative care meeting, decision was to proceed with comfort care. Transition to residential hospice facility.

## 2022-02-11 ENCOUNTER — Inpatient Hospital Stay (HOSPITAL_COMMUNITY): Payer: Medicare Other

## 2022-02-11 ENCOUNTER — Encounter: Payer: Self-pay | Admitting: *Deleted

## 2022-02-11 DIAGNOSIS — R109 Unspecified abdominal pain: Secondary | ICD-10-CM | POA: Diagnosis not present

## 2022-02-11 DIAGNOSIS — Z7189 Other specified counseling: Secondary | ICD-10-CM | POA: Diagnosis not present

## 2022-02-11 DIAGNOSIS — K56600 Partial intestinal obstruction, unspecified as to cause: Secondary | ICD-10-CM | POA: Diagnosis not present

## 2022-02-11 DIAGNOSIS — J869 Pyothorax without fistula: Secondary | ICD-10-CM | POA: Diagnosis not present

## 2022-02-11 DIAGNOSIS — C252 Malignant neoplasm of tail of pancreas: Secondary | ICD-10-CM | POA: Diagnosis not present

## 2022-02-11 DIAGNOSIS — G893 Neoplasm related pain (acute) (chronic): Secondary | ICD-10-CM

## 2022-02-11 LAB — CBC
HCT: 35.2 % — ABNORMAL LOW (ref 39.0–52.0)
Hemoglobin: 10.8 g/dL — ABNORMAL LOW (ref 13.0–17.0)
MCH: 29.9 pg (ref 26.0–34.0)
MCHC: 30.7 g/dL (ref 30.0–36.0)
MCV: 97.5 fL (ref 80.0–100.0)
Platelets: 854 10*3/uL — ABNORMAL HIGH (ref 150–400)
RBC: 3.61 MIL/uL — ABNORMAL LOW (ref 4.22–5.81)
RDW: 17.7 % — ABNORMAL HIGH (ref 11.5–15.5)
WBC: 35.5 10*3/uL — ABNORMAL HIGH (ref 4.0–10.5)
nRBC: 0.1 % (ref 0.0–0.2)

## 2022-02-11 LAB — GLUCOSE, CAPILLARY
Glucose-Capillary: 120 mg/dL — ABNORMAL HIGH (ref 70–99)
Glucose-Capillary: 139 mg/dL — ABNORMAL HIGH (ref 70–99)
Glucose-Capillary: 141 mg/dL — ABNORMAL HIGH (ref 70–99)
Glucose-Capillary: 158 mg/dL — ABNORMAL HIGH (ref 70–99)

## 2022-02-11 LAB — PROCALCITONIN: Procalcitonin: 1.41 ng/mL

## 2022-02-11 MED ORDER — TRAVASOL 10 % IV SOLN
INTRAVENOUS | Status: AC
  Filled 2022-02-11: qty 1122

## 2022-02-11 MED ORDER — LINEZOLID 600 MG/300ML IV SOLN
600.0000 mg | Freq: Two times a day (BID) | INTRAVENOUS | Status: DC
  Administered 2022-02-11 – 2022-02-13 (×6): 600 mg via INTRAVENOUS
  Filled 2022-02-11 (×7): qty 300

## 2022-02-11 MED ORDER — SODIUM CHLORIDE 0.9% FLUSH
10.0000 mL | Freq: Three times a day (TID) | INTRAVENOUS | Status: DC
  Administered 2022-02-11 – 2022-02-14 (×8): 10 mL

## 2022-02-11 NOTE — Progress Notes (Signed)
Inbound call received from [place]. Advised Dr. Harlow Asa of patient's status. Advised to clean area and apply bandage to absorb drainage.

## 2022-02-11 NOTE — Plan of Care (Signed)

## 2022-02-11 NOTE — Progress Notes (Signed)
Patient currently hospitalized for SBO. He was scheduled to come into the office to see Dr Marin Olp today. Will continue to follow for post discharge needs and follow up.   Oncology Nurse Navigator Documentation  Oncology Nurse Navigator Flowsheets 02/11/2022  Navigator Follow Up Date: 02/18/2022  Navigator Follow Up Reason: Appointment Review  Navigator Location [facility]  Navigator Encounter Type Appt/Treatment Plan Review  Telephone -  Patient Visit Type MedOnc  Treatment Phase Active Tx  Barriers/Navigation Needs Coordination of Care;Education  Education -  Interventions None Required  Acuity Level 2-Minimal Needs (1-2 Barriers Identified)  Coordination of Care -  Education Method -  Support Groups/Services Friends and Family  Time Spent with Patient 15

## 2022-02-11 NOTE — Progress Notes (Signed)
Kevin Villegas for Infectious Disease   Reason for visit: Follow up on pleural effusion  Interval History: s/p thoracentesis with 25,000 WBCs, elevated LDH and now culture growth with Enterococcus faecium WBC stable at 35.5.  Reports pain on right side, back, constant  Physical Exam: Constitutional:  Vitals:   02/11/22 0512 02/11/22 1303  BP: (!) 135/94 113/79  Pulse: 83 74  Resp: 18 18  Temp: 98.6 F (37 C) 98.1 F (36.7 C)  SpO2: 94% 100%   patient appears in NAD Respiratory: Normal respiratory effort Cardiovascular: RRR  Review of Systems: Constitutional: negative for fevers and chills Gastrointestinal: negative for nausea and diarrhea  Lab Results  Component Value Date   WBC 35.5 (H) 02/11/2022   HGB 10.8 (L) 02/11/2022   HCT 35.2 (L) 02/11/2022   MCV 97.5 02/11/2022   PLT 854 (H) 02/11/2022    Lab Results  Component Value Date   CREATININE 1.23 02/10/2022   CREATININE 1.20 02/10/2022   BUN 51 (H) 02/10/2022   BUN 50 (H) 02/10/2022   NA 143 02/10/2022   NA 142 02/10/2022   K 4.0 02/10/2022   K 4.0 02/10/2022   CL 112 (H) 02/10/2022   CL 112 (H) 02/10/2022   CO2 24 02/10/2022   CO2 23 02/10/2022    Lab Results  Component Value Date   ALT 91 (H) 02/10/2022   AST 102 (H) 02/10/2022   ALKPHOS 204 (H) 02/10/2022     Microbiology: Recent Results (from the past 240 hour(s))  Culture, blood (routine x 2)     Status: None   Collection Time: 02/04/22  3:42 PM   Specimen: BLOOD  Result Value Ref Range Status   Specimen Description BLOOD LEFT ANTECUBITAL  Final   Special Requests   Final    BOTTLES DRAWN AEROBIC AND ANAEROBIC Blood Culture adequate volume   Culture   Final    NO GROWTH 5 DAYS Performed at [facility], [address] [place], [place] [address]    Report Status 02/09/2022 FINAL  Final  Culture, blood (routine x 2)     Status: None   Collection Time: 02/04/22  3:42 PM   Specimen: BLOOD  Result Value Ref Range Status    Specimen Description BLOOD RIGHT ANTECUBITAL  Final   Special Requests   Final    BOTTLES DRAWN AEROBIC ONLY Blood Culture results may not be optimal due to an inadequate volume of blood received in culture bottles   Culture   Final    NO GROWTH 5 DAYS Performed at [facility], [place] [address], [place], [place] [address]    Report Status 02/09/2022 FINAL  Final  Body fluid culture w Gram Stain     Status: None (Preliminary result)   Collection Time: 02/10/22  1:37 PM   Specimen: PATH Cytology Pleural fluid  Result Value Ref Range Status   Specimen Description   Final    PLEURAL Performed at [facility] [address] [place], [place] [address]    Special Requests   Final    NONE Performed at [facility], [place] [address], [place], [place] [address]    Gram Stain FEW WBC SEEN NO ORGANISMS SEEN   Final   Culture   Final    MODERATE ENTEROCOCCUS FAECIUM SUSCEPTIBILITIES TO FOLLOW CRITICAL RESULT CALLED TO, READ BACK BY AND VERIFIED WITH: RN LISA.P AT 1144 ON 02/11/2022 BY T.SAAD. Performed at [facility], [address] [place], [place] [address]    Report Status PENDING  Incomplete    Impression/Plan:  1. Pleural effusion - increased WBCs, culture growth with Enterococcus.  Noted that he will get a chest tube later today for drainage.  Metastatic disease noted in lungs.   Will start linezolid based on culture growth and monitor.    2.  Leukocytosis - likely from #1.  Will continue to monitor.   3.  Acute kidney injury - creat down to normal yesterday.  Will continue to monitor.    Dr. Baxter Flattery on over the weekend and available if needed, otherwise we will follow up again on Monday

## 2022-02-11 NOTE — Progress Notes (Signed)
Occupational Therapy Treatment Patient Details Name: Kevin Villegas MRN: [id number] DOB: 23-Apr-1941 Today's Date: 02/11/2022   History of present illness Patient is a 81 year old male who presented on 01/28/22 with 2-3 day history of abdominal pain. patient was found to have SBO. patient underwent diagnostic laparoscopy with lysis of adhesions 2/14. PMH:HTN, stage 4 pancreatic cancer,CKD III, status post colectomy, hydronephrosis status post nephrostomy,   OT comments  Treatment focused on out of bed ADLs to improve overall strength and activity tolerance. Patient declined standing at sink and performed grooming task in recliner. Patient educated on why he needs to get out of the bed, ambulate and sit upright. Patient verbalized understanding but will probably need reiteration. Cont POC.    Recommendations for follow up therapy are one component of a multi-disciplinary discharge planning process, led by the attending physician.  Recommendations may be updated based on patient status, additional functional criteria and insurance authorization.    Follow Up Recommendations  Skilled nursing-short term rehab (<3 hours/day)    Assistance Recommended at Discharge Frequent or constant Supervision/Assistance  Patient can return home with the following  A lot of help with bathing/dressing/bathroom;Assistance with cooking/housework;Direct supervision/assist for financial management;Assist for transportation;Direct supervision/assist for medications management;Help with stairs or ramp for entrance;A little help with walking and/or transfers   Equipment Recommendations   (TBD)    Recommendations for Other Services      Precautions / Restrictions Precautions Precautions: Fall Precaution Comments: abdominal wounds, ostomy, drain, Restrictions Weight Bearing Restrictions: No          Balance Overall balance assessment: Needs assistance Sitting-balance support: No upper extremity supported, Feet  supported Sitting balance-Leahy Scale: Good     Standing balance support: Reliant on assistive device for balance                               ADL either performed or assessed with clinical judgement   ADL Overall ADL's : Needs assistance/impaired Eating/Feeding: Independent   Grooming: Set up;Oral care;Wash/dry face;Sitting Grooming Details (indicate cue type and reason): requested seating position instead of standing at sink.                               General ADL Comments: Patient in bed when therapist entered the room. Patinet min assist for hand hold to transfer in to sitting ,verbal cue for hand placement with walker to stand and transfer to recliner.Declined standing grooming and "lets go easy today."     Vision   Vision Assessment?: No apparent visual deficits          Cognition Arousal/Alertness: Awake/alert Behavior During Therapy: WFL for tasks assessed/performed Overall Cognitive Status: Within Functional Limits for tasks assessed                                 General Comments: Mostly functional cognition - but then asked "where did my wife go" who hadn't been in the room.                   Pertinent Vitals/ Pain       Pain Assessment Pain Assessment: Faces Faces Pain Scale: Hurts little more Pain Location: R flank Pain Descriptors / Indicators: Sharp, Grimacing Pain Intervention(s): Monitored during session   Frequency  Min 2X/week  Progress Toward Goals  OT Goals(current goals can now be found in the care plan section)  Progress towards OT goals: Progressing toward goals  Acute Rehab OT Goals Patient Stated Goal: get stronger to go home OT Goal Formulation: With patient Time For Goal Achievement: 02/16/22 Potential to Achieve Goals: Good  Plan Discharge plan remains appropriate    Co-evaluation          OT goals addressed during session: ADL's and self-care (activity tolerance)       AM-PAC OT "6 Clicks" Daily Activity     Outcome Measure   Help from another person eating meals?: A Little Help from another person taking care of personal grooming?: A Little Help from another person toileting, which includes using toliet, bedpan, or urinal?: A Little Help from another person bathing (including washing, rinsing, drying)?: A Lot Help from another person to put on and taking off regular upper body clothing?: A Little Help from another person to put on and taking off regular lower body clothing?: A Lot 6 Click Score: 16    End of Session Equipment Utilized During Treatment: Rolling walker (2 wheels)  OT Visit Diagnosis: Unsteadiness on feet (R26.81);Pain;Muscle weakness (generalized) (M62.81)   Activity Tolerance Patient tolerated treatment well   Patient Left in chair;with call bell/phone within reach;with chair alarm set   Nurse Communication Mobility status        Time: 0383-3383 OT Time Calculation (min): 18 min  Charges: OT General Charges $OT Visit: 1 Visit OT Treatments $Self Care/Home Management : 8-22 mins  Derl Barrow, OTR/L [place]  Office [phone] Pager: [place] 02/11/2022, 9:47 AM

## 2022-02-11 NOTE — Progress Notes (Signed)
PT Cancellation Note  Patient Details Name: Kevin Villegas MRN: [id number] DOB: March 30, 1941   Cancelled Treatment:     attempted twice to see Am "come back later but call first" Sleeping Pt has been evaluated with rec for SNF.  Will continue to follow.    Rica Koyanagi  PTA Acute  Rehabilitation Services Pager      [phone] Office      [phone]

## 2022-02-11 NOTE — Progress Notes (Addendum)
PROGRESS NOTE Kevin Villegas  ZJQ:[id number] DOB: November 14, 1941 DOA: 01/28/2022 PCP: Dettinger, [name], MD   Brief Narrative/Hospital Course: 81 year old male with stage IV pancreatic cancer status post colectomy, hydronephrosis status post left nephrostomy, hypertension, SBO, CKD stage III who presented with abdominal pain, found to have small bowel obstruction status post diagnostic laparoscopy with lysis of adhesion on 2/40 measuring NG suction n.p.o. while waiting for return of bowel function.  Followed by surgery oncology.  Hospital course complicated by coffee-ground colored secretion from NG tube, leukocytosis. -Repeat CT scan 2/20 no bowel obstruction, no abscess moderate amount of free fluid in the right upper quadrant, body wall edema noted to the right anterior wall. With persistent leukocytosis but afebrile, being managed with TPN, IV antibiotics-.  Patient completed IV Zosyn 2/17-2/22.  Diet was slowly advanced NG tube removed at this time patient is tolerating diet having output in colostomy bag, having output in nephrostomy tube.  CT scan 2/21 no intra-abdominal abscess or obstruction but shows enlarging left pleural effusion.  Patient had persistent leukocytosis ID consulted, underwent left pleural diagnostic tap with 50 mL appears exudative fluid but Gram stain negative, total nucleated cells 25,000, neutrophils100,    Subjective: Seen and examined this morning.Patient is alert awake but appears weak and deconditioned. Mostly lying on the bed, not ambulatory.Saturating well on room air, WBC count remains low but slightly better.  Underwent thoracentesis with putaminal fluid for diagnostic reason, had loculated effusion. Urine output 1.100  ml with nephrostomy.  Assessment and Plan: * Partial small bowel obstruction (HCC)- (present on admission) Stage IV pancreatic cancer with partial small bowel obstruction: S/p LOA, small bowel resection, excisional biopsy of mesenteric mass Dr.  Johney Villegas on 2/14.  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 CT scan 2/21 without intra-abdominal abscess or obstruction but enlarging left pleural effusion and new right pleural effusion-surgery on board-Off NG tube 2/21.  On soft diet, Ensure TPN, calorie count. having colostomy output.Completed Zosyn ( 2/17>>2/22)  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)M1  with metastatic disease- (present on admission) See #1.Oncology on board.  Sent message to Dr. Marin Olp regarding his his WBC count  Pleural effusion, left Pleural effusion left more than right. underwent left pleural diagnostic tap with 50 mL appears exudative fluid, loculated, but Gram stain negative, total nucleated cells 25,000, neutrophils100. Will await ID recommendation, consulted pulmonary for further evaluation however patient asymptomatic and not on oxygen.  Leukocytosis Leukocytosis persistent, seen by ID see pleural effusion above, patient completed Zosyn 2/22 for intra-abdominal process.  He is however afebrile.  Recent Labs  Lab 02/05/22 0822 02/07/22 0517 02/08/22 0606 02/09/22 0610 02/10/22 0812  WBC 22.8* 23.5* 25.7* 29.9* 38.6*    E-coli UTI Urine culture with pansensitive E. Coli and completed antibiotic  Hydronephrosis of left kidney- (present on admission) Due to tumor compression, status post left nephrostomy last changed on 1/30.  Discussed with urology previously- recommended renal ultrasound on 2/17 that showed chronic right hydronephrosis with atrophic right kidney advise outpatient follow-up, seen on consult by urology 2/18.  Nephrostomy draining.  Renal function is stable   Goals of care, counseling/discussion Palliative care on board,appreciate input.Patient is DNR, plan is to follow-up palliative care at the facility.  Overall prognosis does not appear bright patient continues to be very weak deconditioned now with local pleural effusion persistent  leukocytosis.  We will continue palliative care engagement.  Stage 3a chronic kidney disease (CKD) ([place])- (present on admission) Slightly AKI with creatinine  at 1.59 improved to 1.2 with extra additional IV fluids.  Continue TPN and wean as tolerated.  Monitor Recent Labs  Lab 02/06/22 1115 02/07/22 0517 02/08/22 0606 02/09/22 0610 02/10/22 0812  BUN 45* 45* 50* 50* 51*   50*  CREATININE 1.28* 1.27* 1.39* 1.50* 1.23   1.20    Colostomy in place [facility]) Having output, cont routine colostomy care.  Essential hypertension- (present on admission) BP stable. Not on meds.  Protein-calorie malnutrition, severe- (present on admission) Augment nutritional status as tolerated.  Dietitian on board. Wean off TPN- calorie count Nutrition Problem: Severe Malnutrition Etiology: chronic illness, cancer and cancer related treatments Signs/Symptoms: severe fat depletion, severe muscle depletion, percent weight loss Interventions: TPN    DVT prophylaxis: enoxaparin (LOVENOX) injection 40 mg Start: 02/02/22 1000 SCDs Start: 01/29/22 0013 Code Status:   Code Status: DNR Family Communication:plan of care discussed with patient/ wife on Phone 2/21.  Wife updated 2/23. 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   Disposition:Currently not medically stable for discharge. Status FX:[id number] Remains Inpatient appropriate because: ongoing management of SBO  I Updated his wife. Patient having serosanguinous with some clots discharge.Surgery will be paged. I informed his wife abt today updates chest tubes, bleeding issues. I informed overall poor prognosis. She will  be here tomorrow.   Objective: Vitals last 24 hrs: Vitals:   02/10/22 1330 02/10/22 1741 02/10/22 2022 02/11/22 0512  BP: 137/90 (!) 139/95 121/85 (!) 135/94  Pulse:  81 78 83  Resp:  18 18 18   Temp:  (!) 97.5 F (36.4 C) 98.3 F (36.8 C) 98.6 F (37 C)  TempSrc:  Oral Oral Oral   SpO2:  (!) 89% 94% 94%  Weight:      Height:       Weight change:   Physical Examination: General exam: AA, ill looking frail, deconditioned older than stated age, weak appearing. HEENT:Oral mucosa moist, Ear/Nose WNL grossly, dentition normal. Respiratory system: bilaterally diminished,no use of accessory muscle Cardiovascular system: S1 & S2 +, No JVD,. Gastrointestinal system: Abdomen soft, colostomy in place bowel sounds present nontender  Nervous System:Alert, awake, generalized weakness  extremities: mild edema in ankle,distal peripheral pulses palpable.  Skin: No rashes,no icterus. MSK: Normal muscle bulk,tone, power  Medications reviewed:  Scheduled Meds:  Chlorhexidine Gluconate Cloth  6 each Topical Daily   enoxaparin (LOVENOX) injection  40 mg Subcutaneous Q24H   feeding supplement  1 Container Oral TID BM   feeding supplement  237 mL Oral BID BM   insulin aspart  0-15 Units Subcutaneous Q6H   lidocaine  1 patch Transdermal Q24H   lip balm  1 application Topical BID   methocarbamol  500 mg Oral QID   multivitamin with minerals  1 tablet Oral Daily   sodium chloride flush  3 mL Intravenous Q12H   Continuous Infusions:  sodium chloride Stopped (01/28/22 2301)   linezolid (ZYVOX) IV     TPN ADULT (ION) 85 mL/hr at 02/10/22 1808   TPN ADULT (ION)        Diet Order             DIET SOFT Room service appropriate? Yes; Fluid consistency: Thin  Diet effective now                    Nutrition Problem: Severe Malnutrition Etiology: chronic illness, cancer and cancer related treatments Signs/Symptoms: severe fat depletion, severe muscle depletion, percent weight loss Interventions: TPN  Intake/Output Summary (Last 24 hours) at 02/11/2022 1245 Last data filed at 02/11/2022 0500 Gross per 24 hour  Intake 1809.4 ml  Output 625 ml  Net 1184.4 ml   Net IO Since Admission: 8,105.2 mL [02/11/22 1245]  Wt Readings from Last 3 Encounters:  02/10/22 70.4 kg   01/07/22 63.5 kg  12/31/21 63.5 kg     Unresulted Labs (From admission, onward)     Start     Ordered   02/12/22 0354  Basic metabolic panel  Tomorrow morning,   R       Question:  Specimen collection method  Answer:  Lab=Lab collect   02/11/22 0726   02/12/22 0500  Magnesium  Tomorrow morning,   R       Question:  Specimen collection method  Answer:  Lab=Lab collect   02/11/22 0726   02/11/22 0500  Procalcitonin  Daily,   R     Question:  Specimen collection method  Answer:  Lab=Lab collect   02/10/22 0954   01/31/22 0500  Comprehensive metabolic panel  (TPN Lab Panel)  Every Mon,Thu (0500),   R     Question:  Specimen collection method  Answer:  Unit=Unit collect   01/30/22 0742   01/31/22 0500  Magnesium  (TPN Lab Panel)  Every Mon,Thu (0500),   R     Question:  Specimen collection method  Answer:  Unit=Unit collect   01/30/22 0742   01/31/22 0500  Phosphorus  (TPN Lab Panel)  Every Mon,Thu (0500),   R     Question:  Specimen collection method  Answer:  Unit=Unit collect   01/30/22 0742   01/31/22 0500  Triglycerides  (TPN Lab Panel)  Every Monday (0500),   R     Question:  Specimen collection method  Answer:  Unit=Unit collect   01/30/22 0742          Data Reviewed: I have personally reviewed following labs and imaging studies CBC: Recent Labs  Lab 02/07/22 0517 02/08/22 0606 02/09/22 0610 02/10/22 0812 02/11/22 0607  WBC 23.5* 25.7* 29.9* 38.6* 35.5*  HGB 10.6* 10.7* 11.0* 10.7* 10.8*  HCT 33.1* 33.0* 34.1* 33.7* 35.2*  MCV 93.5 93.0 93.7 95.5 97.5  PLT 541* 622* 642* 771* 656*   Basic Metabolic Panel: Recent Labs  Lab 02/05/22 0822 02/06/22 1115 02/07/22 0517 02/08/22 0606 02/09/22 0610 02/10/22 0812  NA 135 139 140 139 140 143   142  K 3.3* 3.3* 3.4* 3.6 3.6 4.0   4.0  CL 103 107 106 105 107 112*   112*  CO2 24 25 25 26 25 24   23   GLUCOSE 144* 162* 193* 162* 173* 160*   160*  BUN 40* 45* 45* 50* 50* 51*   50*  CREATININE 1.21 1.28* 1.27* 1.39*  1.50* 1.23   1.20  CALCIUM 8.5* 8.4* 8.6* 8.3* 8.3* 8.3*   8.2*  MG 2.0  --  2.2  --  2.3 2.3  PHOS  --   --  3.6  --  4.0 3.6   GFR: Estimated Creatinine Clearance: 47.7 mL/min (by C-G formula based on SCr of 1.23 mg/dL). Liver Function Tests: Recent Labs  Lab 02/07/22 0517 02/08/22 0606 02/09/22 0610 02/10/22 0812  AST 207* 142* 108* 102*  ALT 122* 114* 94* 91*  ALKPHOS 218* 245* 207* 204*  BILITOT 0.6 0.6 0.8 0.7  PROT 5.7* 5.4* 5.4* 5.8*  ALBUMIN 1.7* 1.6* 1.5* 1.6*   No results for input(s): LIPASE, AMYLASE in the  last 168 hours. No results for input(s): AMMONIA in the last 168 hours. Coagulation Profile: No results for input(s): INR, PROTIME in the last 168 hours. Cardiac Enzymes: No results for input(s): CKTOTAL, CKMB, CKMBINDEX, TROPONINI in the last 168 hours. BNP (last 3 results) No results for input(s): PROBNP in the last 8760 hours. HbA1C: No results for input(s): HGBA1C in the last 72 hours. CBG: Recent Labs  Lab 02/10/22 1513 02/10/22 1704 02/10/22 2338 02/11/22 0550 02/11/22 1118  GLUCAP 150* 148* 157* 158* 139*   Lipid Profile: No results for input(s): CHOL, HDL, LDLCALC, TRIG, CHOLHDL, LDLDIRECT in the last 72 hours.  Thyroid Function Tests: No results for input(s): TSH, T4TOTAL, FREET4, T3FREE, THYROIDAB in the last 72 hours. Anemia Panel: No results for input(s): VITAMINB12, FOLATE, FERRITIN, TIBC, IRON, RETICCTPCT in the last 72 hours. Sepsis Labs: Recent Labs  Lab 02/10/22 1540 02/11/22 0607  PROCALCITON 1.88 1.41     Recent Results (from the past 240 hour(s))  Culture, blood (routine x 2)     Status: None   Collection Time: 02/04/22  3:42 PM   Specimen: BLOOD  Result Value Ref Range Status   Specimen Description BLOOD LEFT ANTECUBITAL  Final   Special Requests   Final    BOTTLES DRAWN AEROBIC AND ANAEROBIC Blood Culture adequate volume   Culture   Final    NO GROWTH 5 DAYS Performed at [facility], [address],  [place], [place] [address]    Report Status 02/09/2022 FINAL  Final  Culture, blood (routine x 2)     Status: None   Collection Time: 02/04/22  3:42 PM   Specimen: BLOOD  Result Value Ref Range Status   Specimen Description BLOOD RIGHT ANTECUBITAL  Final   Special Requests   Final    BOTTLES DRAWN AEROBIC ONLY Blood Culture results may not be optimal due to an inadequate volume of blood received in culture bottles   Culture   Final    NO GROWTH 5 DAYS Performed at [facility], [place] [address], [place], [place] [address]    Report Status 02/09/2022 FINAL  Final  Body fluid culture w Gram Stain     Status: None (Preliminary result)   Collection Time: 02/10/22  1:37 PM   Specimen: PATH Cytology Pleural fluid  Result Value Ref Range Status   Specimen Description   Final    PLEURAL Performed at [facility] [address] [place], [place] [address]    Special Requests   Final    NONE Performed at [facility], [place] [address], [place], [place] [address]    Gram Stain FEW WBC SEEN NO ORGANISMS SEEN   Final   Culture   Final    MODERATE ENTEROCOCCUS FAECIUM SUSCEPTIBILITIES TO FOLLOW CRITICAL RESULT CALLED TO, READ BACK BY AND VERIFIED WITH: RN LISA.P AT 1144 ON 02/11/2022 BY T.SAAD. Performed at [facility], [place] [address] [place], [place] [address]    Report Status PENDING  Incomplete    Antimicrobials: Anti-infectives (From admission, onward)    Start     Dose/Rate Route Frequency Ordered Stop   02/11/22 1330  linezolid (ZYVOX) IVPB 600 mg        600 mg 300 mL/hr over 60 Minutes Intravenous Every 12 hours 02/11/22 1234     02/04/22 1545  piperacillin-tazobactam (ZOSYN) IVPB 3.375 g        3.375 g 12.5 mL/hr over 240 Minutes Intravenous Every 8  hours 02/04/22 1453 02/09/22 1359   02/01/22 2200  ceFAZolin (ANCEF) IVPB 1 g/50 mL premix  Status:  Discontinued        1 g 100 mL/hr over 30 Minutes Intravenous Every 8 hours  02/01/22 1504 02/04/22 1453   02/01/22 0930  cefoTEtan (CEFOTAN) 2 g in sodium chloride 0.9 % 100 mL IVPB        2 g 200 mL/hr over 30 Minutes Intravenous On call to O.R. 01/31/22 1908 02/01/22 1533   01/30/22 1000  cefTRIAXone (ROCEPHIN) 1 g in sodium chloride 0.9 % 100 mL IVPB  Status:  Discontinued        1 g 200 mL/hr over 30 Minutes Intravenous Every 24 hours 01/29/22 0013 02/01/22 1504   01/28/22 1830  cefTRIAXone (ROCEPHIN) 1 g in sodium chloride 0.9 % 100 mL IVPB        1 g 200 mL/hr over 30 Minutes Intravenous  Once 01/28/22 1828 01/28/22 1927      Culture/Microbiology    Component Value Date/Time   SDES  02/10/2022 1337    PLEURAL Performed at [facility], [place] [address], [place], [place] [address]    SPECREQUEST  02/10/2022 1337    NONE Performed at [facility], [place] [address], [place], [place] [address]    CULT  02/10/2022 1337    MODERATE ENTEROCOCCUS FAECIUM SUSCEPTIBILITIES TO FOLLOW CRITICAL RESULT CALLED TO, READ BACK BY AND VERIFIED WITH: RN LISA.P AT 1144 ON 02/11/2022 BY T.SAAD. Performed at [facility], [place] [address], [place], [place] [address]    REPTSTATUS PENDING 02/10/2022 1337  Radiology Studies: DG Chest 1 View  Result Date: 02/10/2022 CLINICAL DATA:  Status post thoracentesis EXAM: CHEST  1 VIEW COMPARISON:  02/09/2022 FINDINGS: Numerous bilateral pulmonary nodules. Trace right pleural effusion. Loculated left pleural effusion. No significant interval change compared with the prior exam. No pneumothorax. Stable cardiomediastinal silhouette. Right-sided Port-A-Cath in satisfactory position. No acute osseous abnormality. IMPRESSION: 1. No significant interval change compared with the prior exam. No pneumothorax. 2. Numerous bilateral pulmonary nodules. 3. Trace right pleural effusion. 4. Loculated left pleural effusion. Electronically Signed   By: Kathreen Devoid M.D.   On: 02/10/2022 13:55   US THORACENTESIS  ASP PLEURAL SPACE W/IMG GUIDE  Result Date: 02/10/2022 INDICATION: Patient with metastatic pancreatic cancer presents today with left pleural effusion. Interventional radiology asked to perform a diagnostic and therapeutic thoracentesis. EXAM: ULTRASOUND GUIDED THORACENTESIS MEDICATIONS: 1% lidocaine 10 mL COMPLICATIONS: None immediate. PROCEDURE: An ultrasound guided thoracentesis was thoroughly discussed with the patient and questions answered. The benefits, risks, alternatives and complications were also discussed. The patient understands and wishes to proceed with the procedure. Written consent was obtained. Ultrasound was performed to localize and mark an adequate pocket of fluid in the left chest. Loculated collection identified. The area was then prepped and draped in the normal sterile fashion. 1% Lidocaine was used for local anesthesia. Under ultrasound guidance a 6 Fr Safe-T-Centesis catheter was introduced. Thoracentesis was performed. The catheter was removed and a dressing applied. FINDINGS: A total of approximately 50 mL of light red/thick/purulent fluid was removed. Samples were sent to the laboratory as requested by the clinical team. IMPRESSION: Successful ultrasound guided left thoracentesis yielding 50 mL of pleural fluid. Read by: Soyla Dryer, NP Electronically Signed   By: Aletta Edouard M.D.   On: 02/10/2022 14:38     LOS: 13 days   Antonieta Pert, MD [facility]  02/11/2022, 12:45 PM

## 2022-02-11 NOTE — Progress Notes (Signed)
Attending physician bedside for assessment. Advised Probation officer to Barrister's clerk to report status of patient. [facility] notified via page by phone attendant.

## 2022-02-11 NOTE — Progress Notes (Signed)
During assessment of patient noticed there was significant amount of bloody, purulent drainage mixed with coin sized blood clots exiting from one of his incisional sites. Notified attending physician.

## 2022-02-11 NOTE — Progress Notes (Signed)
° ° ° °  Called to see patient by nurse due to drainage from surgical wound.  Patient seen and examined with nurse, Caryl Pina.  Previous dressing removed.  Purulent drainage on gauze dressing, small to moderate.  No evidence of bleeding or sign of GI contents.  Surrounding induration and mild erythema consistent with wound infection.  Wound open at skin 1.5 cm.  Dressing replaced with gauze and ABD pad.  Infectious disease has patient on linezolid starting today.  Will monitor wound.  No need for acute surgical intervention.  Armandina Gemma, MD [facility] [place] [facility] Office: [phone]

## 2022-02-11 NOTE — Progress Notes (Addendum)
PHARMACY - TOTAL PARENTERAL NUTRITION CONSULT NOTE   Indication: Small bowel obstruction and inability to meet nutritional needs w/ PO intake  Patient Measurements: Height: 5' 11"  (180.3 cm) Weight: 70.4 kg (155 lb 3.3 oz) IBW/kg (Calculated) : 75.3 TPN AdjBW (KG): 57.7 Body mass index is 21.65 kg/m. Usual Weight: Weight noted in November 2022 to be 63.5 kg  Assessment: 81 yo male presenting to [facility] ED for abdominal pain, nausea, vomiting. PMH includes pancreatic cancer with evidence of metastatic disease to his bladder, lungs, and pleural space and history of SBO.  S/p stent placement between stomach and intestines and colostomy placement in June 2022. Pharmacy is consulted to begin TPN for possible bridge to surgery given low pre-albumin and inability to meet nutritional needs w/ PO intake.   Glucose / Insulin: No hx DM;  - mSS q6h (used 10 units in 24 hrs) - cbgs (goal <150): 129-158 - acceptable Electrolytes: no labs today, 2/23 all WNL Renal: SCr 1.23 (2/23)  (crcl~48); BUN elevated but stable at 50 Hepatic: AST/ALT remain slightly elevated, Alk phos elevated; Tbili wnl -  Pre-albumin low at 8.2 (2/11); albumin low at 1.6. TG 119 (2/20) Intake / Output; MIVF:  - I/O: +1475 ml - UOP: 1100 ml - NGT out 2/21 - 2/23 no stool OP recorded, 2/22 75 ml, 2/21600 ml 2/21 NS 50 mlhr> DC 2/22 2330 2/23 240 ml po intake recorded, 10% of 1 meal recorded 2/24 start of calorie count GI Imaging: - 2/10 CT abdomen pelvis: re-demonstration of high-grade partial SBO with transition point in the upper left pelvis psych likely due to adhesion.  - 2/20 abd CT: No evidence of postoperative intra-abdominal abscess. BL pleural effusions. Pulmonary metastatic disease in the lung bases with multiple pulmonary nodules. GI Surgeries / Procedures:  - 2/14: laparoscopic lysis of adhesions, small bowel resection, excisional bipsy of mesentery mass   Central access: PAC TPN start date: 2/12  Nutritional  Goals: Goal TPN rate is 85 mL/hr (provides 112g of protein and 2061 kcals per day)  RD Assessment:  Estimated Needs Total Energy Estimated Needs: 2050-2250 kcal Total Protein Estimated Needs: 105-120 grams Total Fluid Estimated Needs: >/= 2 L/day  Current Nutrition:  Soft diet Boost Breeze TID, each provides 250 kcal and 9 grams of protein Ensure plus BID TPN  Plan:  Soft diet plus supplements with Boost Breeze & Ensure plus. First day of 48 hour calorie count At 1800: - Continue TPN at goal rate of 83m/hr - Electrolytes in TPN:  Na 1033m/L K 3066mL Ca 2mE25m Mg 5 mEq/L Phos 10mm51m Cl:Ac 1:1 - MVI PO   - continue moderate SSI q6h and adjust as needed. - TPN labs on Mon/Thurs, BMET & Mg in AM - f/u tolerance of PO diet and ability to wean TPN   MicheEudelia Bunchrm.D 02/11/2022 7:15 AM

## 2022-02-11 NOTE — Progress Notes (Signed)
Progress Note  10 Days Post-Op  Subjective: No acute changes. Abdominal pain is stable overall though he does complain of some bilateral musculoskeletal pain along his lateral abdomen/lower back - robaxin seems to help this. He is drinking ensures but did not try soft foods yesterday - he has low appetite  Objective: Vital signs in last 24 hours: Temp:  [97.5 F (36.4 C)-98.6 F (37 C)] 98.6 F (37 C) (02/24 0512) Pulse Rate:  [78-83] 83 (02/24 0512) Resp:  [18] 18 (02/24 0512) BP: (121-139)/(80-95) 135/94 (02/24 0512) SpO2:  [89 %-94 %] 94 % (02/24 0512) Last BM Date : 02/10/22 (ostomy - small output)  Intake/Output from previous day: 02/23 0701 - 02/24 0700 In: 2575 [P.O.:240; I.V.:2235; IV Piggyback:100] Out: 1100 [Urine:1100] Intake/Output this shift: No intake/output data recorded.  PE: General: chronically ill appearing, male who is laying in bed in NAD HEENT: head is normocephalic, atraumatic. Mouth is pink and moist Heart: Palpable radial pulses bilaterally Lungs: Respiratory effort nonlabored Abd: soft, ND, + BS, moderate TTP without rebound or guarding greatest in RLQ. incisions intact - no erythema, induration, or purulent drainage. Soft stool in colostomy. MSK: all 4 extremities are symmetrical with no cyanosis, clubbing, or edema. No calf edema or TTP bilaterally Skin: warm and dry Psych: A&Ox3 with an appropriate affect.    Lab Results:  Recent Labs    02/10/22 0812 02/11/22 0607  WBC 38.6* 35.5*  HGB 10.7* 10.8*  HCT 33.7* 35.2*  PLT 771* 854*    BMET Recent Labs    02/09/22 0610 02/10/22 0812  NA 140 143   142  K 3.6 4.0   4.0  CL 107 112*   112*  CO2 25 24   23   GLUCOSE 173* 160*   160*  BUN 50* 51*   50*  CREATININE 1.50* 1.23   1.20  CALCIUM 8.3* 8.3*   8.2*    PT/INR No results for input(s): LABPROT, INR in the last 72 hours. CMP     Component Value Date/Time   NA 143 02/10/2022 0812   NA 142 02/10/2022 0812   NA 139  12/31/2021 1127   K 4.0 02/10/2022 0812   K 4.0 02/10/2022 0812   CL 112 (H) 02/10/2022 0812   CL 112 (H) 02/10/2022 0812   CO2 24 02/10/2022 0812   CO2 23 02/10/2022 0812   GLUCOSE 160 (H) 02/10/2022 0812   GLUCOSE 160 (H) 02/10/2022 0812   BUN 51 (H) 02/10/2022 0812   BUN 50 (H) 02/10/2022 0812   BUN 38 (H) 12/31/2021 1127   CREATININE 1.23 02/10/2022 0812   CREATININE 1.20 02/10/2022 0812   CREATININE 1.58 (H) 11/18/2021 1149   CALCIUM 8.3 (L) 02/10/2022 0812   CALCIUM 8.2 (L) 02/10/2022 0812   PROT 5.8 (L) 02/10/2022 0812   PROT 7.2 12/31/2021 1127   ALBUMIN 1.6 (L) 02/10/2022 0812   ALBUMIN 3.9 12/31/2021 1127   AST 102 (H) 02/10/2022 0812   AST 44 (H) 11/18/2021 1149   ALT 91 (H) 02/10/2022 0812   ALT 45 (H) 11/18/2021 1149   ALKPHOS 204 (H) 02/10/2022 0812   BILITOT 0.7 02/10/2022 0812   BILITOT 0.3 12/31/2021 1127   BILITOT 0.3 11/18/2021 1149   GFRNONAA 59 (L) 02/10/2022 0812   GFRNONAA >60 02/10/2022 0812   GFRNONAA 44 (L) 11/18/2021 1149   GFRAA >60 11/24/2019 1600   Lipase     Component Value Date/Time   LIPASE 42 01/28/2022 1500  Studies/Results: DG Chest 1 View  Result Date: 02/10/2022 CLINICAL DATA:  Status post thoracentesis EXAM: CHEST  1 VIEW COMPARISON:  02/09/2022 FINDINGS: Numerous bilateral pulmonary nodules. Trace right pleural effusion. Loculated left pleural effusion. No significant interval change compared with the prior exam. No pneumothorax. Stable cardiomediastinal silhouette. Right-sided Port-A-Cath in satisfactory position. No acute osseous abnormality. IMPRESSION: 1. No significant interval change compared with the prior exam. No pneumothorax. 2. Numerous bilateral pulmonary nodules. 3. Trace right pleural effusion. 4. Loculated left pleural effusion. Electronically Signed   By: Kathreen Devoid M.D.   On: 02/10/2022 13:55   DG Chest Port 1 View  Result Date: 02/09/2022 CLINICAL DATA:  Leukocytosis, pancreatic cancer EXAM: PORTABLE  CHEST 1 VIEW COMPARISON:  02/04/2022 FINDINGS: Right-sided chest port remains in place. Enteric tube has been removed. Stable heart size. Multiple pulmonary nodules and masses throughout both lungs. Moderate sized left pleural effusion. Trace right pleural effusion. No pneumothorax. IMPRESSION: 1. Moderate left and trace right pleural effusions. 2. Multiple pulmonary nodules and masses throughout both lungs compatible with known metastatic disease. Electronically Signed   By: Davina Poke D.O.   On: 02/09/2022 12:33   US THORACENTESIS ASP PLEURAL SPACE W/IMG GUIDE  Result Date: 02/10/2022 INDICATION: Patient with metastatic pancreatic cancer presents today with left pleural effusion. Interventional radiology asked to perform a diagnostic and therapeutic thoracentesis. EXAM: ULTRASOUND GUIDED THORACENTESIS MEDICATIONS: 1% lidocaine 10 mL COMPLICATIONS: None immediate. PROCEDURE: An ultrasound guided thoracentesis was thoroughly discussed with the patient and questions answered. The benefits, risks, alternatives and complications were also discussed. The patient understands and wishes to proceed with the procedure. Written consent was obtained. Ultrasound was performed to localize and mark an adequate pocket of fluid in the left chest. Loculated collection identified. The area was then prepped and draped in the normal sterile fashion. 1% Lidocaine was used for local anesthesia. Under ultrasound guidance a 6 Fr Safe-T-Centesis catheter was introduced. Thoracentesis was performed. The catheter was removed and a dressing applied. FINDINGS: A total of approximately 50 mL of light red/thick/purulent fluid was removed. Samples were sent to the laboratory as requested by the clinical team. IMPRESSION: Successful ultrasound guided left thoracentesis yielding 50 mL of pleural fluid. Read by: Soyla Dryer, NP Electronically Signed   By: Aletta Edouard M.D.   On: 02/10/2022 14:38     Anti-infectives: Anti-infectives (From admission, onward)    Start     Dose/Rate Route Frequency Ordered Stop   02/04/22 1545  piperacillin-tazobactam (ZOSYN) IVPB 3.375 g        3.375 g 12.5 mL/hr over 240 Minutes Intravenous Every 8 hours 02/04/22 1453 02/09/22 1359   02/01/22 2200  ceFAZolin (ANCEF) IVPB 1 g/50 mL premix  Status:  Discontinued        1 g 100 mL/hr over 30 Minutes Intravenous Every 8 hours 02/01/22 1504 02/04/22 1453   02/01/22 0930  cefoTEtan (CEFOTAN) 2 g in sodium chloride 0.9 % 100 mL IVPB        2 g 200 mL/hr over 30 Minutes Intravenous On call to O.R. 01/31/22 1908 02/01/22 1533   01/30/22 1000  cefTRIAXone (ROCEPHIN) 1 g in sodium chloride 0.9 % 100 mL IVPB  Status:  Discontinued        1 g 200 mL/hr over 30 Minutes Intravenous Every 24 hours 01/29/22 0013 02/01/22 1504   01/28/22 1830  cefTRIAXone (ROCEPHIN) 1 g in sodium chloride 0.9 % 100 mL IVPB        1 g 200 mL/hr  over 30 Minutes Intravenous  Once 01/28/22 1828 01/28/22 1927        Assessment/Plan Stage IV pancreatic cancer with psbo  - POD10 s/p Lap LOA, Small bowel resection, excisional biopsy of mesentery mass by Dr. 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 May be reactive in setting of metastatic disease.  - CT scan 2/21 without intra abdominal abscess or obstruction. Some free fluid in the pelvis and RUQ postoperatively. Enlarging left pleural effusion and new right pleural effusion. Do not think something intraabdominal is driving his leukocytosis - Having colostomy output. Has not tried soft foods yet. Low appetite but says he will try today - continue TPN, consulted RD for calorie count and consider weaning TPN. CC start 2/23 - PT/OT - recc snf with possible progression to [facility]  FEN:  SOFT, ensure. CC. TPN ID: cefotetan periop, ancef (UTI), zosyn 2/17>2/21 VTE: lovenox  E coli UTI CKD 3A HTN   LOS: 13 days   Winferd Humphrey, [facility] 02/11/2022, 8:30 AM Please see Amion for pager number during day hours 7:00am-4:30pm

## 2022-02-11 NOTE — Progress Notes (Signed)
Independently examined pt, evaluated data & formulated above care plan with NP/resident   81 year old unfortunate man with metastatic pancreatic cancer.  He was admitted 2/9 for recurrent small bowel obstruction and on 2/14 underwent lysis of adhesions, small bowel resection and excision biopsy of mesenteric mass by Dr. 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 He was  treated for E. coli UTI  He also has a history of left hydronephrosis status post nephrostomy tube with stent, chronic right renal atrophy and hydroureteronephrosis  Review of his serial imaging dating back to November of last year shows metastatic lung nodules with left pleural disease and a small to moderate size left effusion. He had persistent leukocytosis and underwent left thoracentesis, only 50 cc of thick right purulent fluid was removed. Pleural fluid results shows 25K cells with 100% neutrophils, culture showing Enterococcus.  On exam Afebrile, cachectic man, 100% on room air, S1-S2 regular, soft nontender abdomen, decreased breath sounds on left.   Labs show extreme leukocytosis, stable anemia, thrombocytosis, normal electrolytes slight high alk phos, stable creatinine.  Impression/plan Left empyema -with Enterococcus likely seeding previously known loculated left effusion, likely malignant  -He is not a candidate for surgery.  We will plan to place small bore chest tube at the bedside and use intrapleural lytics, if unable will need CT-guided chest tube placement  Suhaan Perleberg V. Elsworth Soho MD

## 2022-02-11 NOTE — Care Management Important Message (Signed)
Important Message  Patient Details IM Letter placed in Patients room. Name: Kevin Villegas MRN: [id number] Date of Birth: 11-01-1941   Medicare Important Message Given:  Yes     Kerin Salen 02/11/2022, 9:29 AM

## 2022-02-11 NOTE — Progress Notes (Signed)
PCCM progress note  I presented to patient's bedside again this afternoon to attempt to place pigtail chest tube.  More awake and alert compared to original assessment but there seems to be some underlying confusion as well. He asked if procedure would be painful educated that local anesthetic would be used and he is stated that the thoracentesis yesterday was quite painful and he does not want to repeat that  He refused for chest tube to be placed currently requested time to "think about it".   Indications for chest tube explained and patient verbalized understanding.  Also emphasized risk for untreated empyema and patient verbalized understanding.  Asked the patient if I could contact family to help make decisions and patient refused.  I notified my attending and the primary team.   Jamarri Vuncannon D. Kenton Kingfisher, NP-C [facility] Personal contact information can be found on Amion  02/11/2022, 2:50 PM

## 2022-02-11 NOTE — Procedures (Signed)
Insertion of Chest Tube Procedure Note  Frederick Marro  [id number]  [birth year]  Date:02/11/22  Time:4:25 PM    Provider Performing: Leanna Sato. Lucindia Lemley   Procedure: Pleural Catheter Insertion w/ Imaging Guidance [phone])  Indication(s) Effusion  Consent Risks of the procedure as well as the alternatives and risks of each were explained to the patient and/or caregiver.  Consent for the procedure was obtained and is signed in the bedside chart  Anesthesia Topical only with 1% lidocaine    Time Out Verified patient identification, verified procedure, site/side was marked, verified correct patient position, special equipment/implants available, medications/allergies/relevant history reviewed, required imaging and test results available.   Sterile Technique Maximal sterile technique including full sterile barrier drape, hand hygiene, sterile gown, sterile gloves, mask, hair covering, sterile ultrasound probe cover (if used).   Procedure Description Ultrasound used to identify appropriate pleural anatomy for placement and overlying skin marked. Area of placement cleaned and draped in sterile fashion.  A 14 French pigtail pleural catheter was placed into the left pleural space using Seldinger technique. Appropriate return of fluid was obtained.  The tube was connected to atrium and placed on -20 cm H2O wall suction.   Complications/Tolerance None; patient tolerated the procedure well. Chest X-ray is ordered to verify placement.   EBL Minimal  Specimen(s) none  Edda Orea V. Elsworth Soho MD

## 2022-02-11 NOTE — Consult Note (Addendum)
NAME:  Kevin Villegas, MRN:  [id number], DOB:  1941/07/18, LOS: 55 ADMISSION DATE:  01/28/2022, CONSULTATION DATE:  02/11/2022 REFERRING MD:  Dr. Antonieta Pert, CHIEF COMPLAINT:  Pleural effusion   History of Present Illness:  Kevin Villegas is a 81 y.o. male with a PMH significant for metastatic pancreatic cancer, large bowel obstruction resulting in colectomy and colostomy who originally 2/9 for concern of recurrent bowel obstruction.   He has been managed medically but Hospitalist services and general surgery for partial small bowel obstruction in the setting of metastatic pancreatic cancer and an E.Coli UTI  On 2/24 PCCM was consulted for assistance in management of a loculated pleural effusion. On 2/23 patient underwent left thoracentesis with only 81mls of fluid removed, low volume output flet secondary to loculation.   Pertinent  Medical History  Metastatic pancreatic cancer, large bowel obstruction resulting in colectomy and colostomy  Significant Hospital Events: Including procedures, antibiotic start and stop dates in addition to other pertinent events   2/10 admitted  2/14 Underwent lysis of adhesions and laparoscopic small bowel resection with Dr. Johney Villegas 2/23 underwent left thoracentesis with 51ml fluid removed 2/24 Pulmonary consulted for management of loculated pleural effusion   Images  1.No evidence of postoperative intra-abdominal abscess. Moderate right upper quadrant free fluid and small amount of free fluid in the pelvis is non organized. 2. Enteric tube terminates at the gastroesophageal junction. 3. Left nephrostomy and left nephroureteral stent in place without left hydronephrosis. Chronic right renal atrophy and hydroureteronephrosis. 4. Pulmonary metastatic disease in the lung bases with multiple pulmonary nodules. Enlarging left pleural effusion with pleural thickening, possibly metastatic. Moderate-sized right pleural effusion is new. 5. Diffuse body wall  edema. 6. Soft tissue nodule in the right anterior abdominal wall is unchanged, possibly metastatic. 7. Additional chronic findings are unchanged.  Interim History / Subjective:  Seen lying in bed lethargic, reports intermittent non specific pain   Objective   Blood pressure (!) 135/94, pulse 83, temperature 98.6 F (37 C), temperature source Oral, resp. rate 18, height 5\' 11"  (1.803 m), weight 70.4 kg, SpO2 94 %.        Intake/Output Summary (Last 24 hours) at 02/11/2022 1055 Last data filed at 02/11/2022 0500 Gross per 24 hour  Intake 1809.4 ml  Output 800 ml  Net 1009.4 ml   Filed Weights   02/08/22 0353 02/09/22 0545 02/10/22 0518  Weight: 70 kg 69.8 kg 70.4 kg    Examination: General: Acute on chronically ill appearing deconditioned frail elderly male lying in bed in NAD HEENT: [place]/AT, MM pink/moist, PERRL,  Neuro: Lethargic but will arouse easy, alert and oriented x3 CV: s1s2 regular rate and rhythm, no murmur, rubs, or gallops,  PULM:  Diminished left side, no increased work of breathing, no added breath sounds GI: soft, bowel sounds active in all 4 quadrants, non-tender, slightly distended, left nephrostomy tube  Extremities: warm/dry, no edema  Skin: no rashes or lesions  Resolved Hospital Problem list     Assessment & Plan:  Loculated left pleural effusion Trace right pleura effusion  -CT ABD pelvis revealed pulmonary metastatic disease in the lung bases with multiple pulmonary nodules. Enlarging left pleural effusion with pleural thickening, possibly metastatic and moderate-sized right pleural effusion -IR preformed left thoracentesis 2/23 with 50 ml of light red/purulent/thick fluid removed  P: Plan to place small bore chest tube this afternoon  Obtain pleural cytology  Once placed routine chest tube care  Pleural fluid growing Enterococcus, Zyvox started  Best Practice (right click and "Reselect all SmartList Selections" daily)  Per primary   Labs    CBC: Recent Labs  Lab 02/07/22 0517 02/08/22 0606 02/09/22 0610 02/10/22 0812 02/11/22 0607  WBC 23.5* 25.7* 29.9* 38.6* 35.5*  HGB 10.6* 10.7* 11.0* 10.7* 10.8*  HCT 33.1* 33.0* 34.1* 33.7* 35.2*  MCV 93.5 93.0 93.7 95.5 97.5  PLT 541* 622* 642* 771* 854*    Basic Metabolic Panel: Recent Labs  Lab 02/05/22 0822 02/06/22 1115 02/07/22 0517 02/08/22 0606 02/09/22 0610 02/10/22 0812  NA 135 139 140 139 140 143   142  K 3.3* 3.3* 3.4* 3.6 3.6 4.0   4.0  CL 103 107 106 105 107 112*   112*  CO2 24 25 25 26 25 24   23   GLUCOSE 144* 162* 193* 162* 173* 160*   160*  BUN 40* 45* 45* 50* 50* 51*   50*  CREATININE 1.21 1.28* 1.27* 1.39* 1.50* 1.23   1.20  CALCIUM 8.5* 8.4* 8.6* 8.3* 8.3* 8.3*   8.2*  MG 2.0  --  2.2  --  2.3 2.3  PHOS  --   --  3.6  --  4.0 3.6   GFR: Estimated Creatinine Clearance: 47.7 mL/min (by C-G formula based on SCr of 1.23 mg/dL). Recent Labs  Lab 02/08/22 0606 02/09/22 0610 02/10/22 0812 02/11/22 0607  PROCALCITON  --   --  1.88 1.41  WBC 25.7* 29.9* 38.6* 35.5*    Liver Function Tests: Recent Labs  Lab 02/07/22 0517 02/08/22 0606 02/09/22 0610 02/10/22 0812  AST 207* 142* 108* 102*  ALT 122* 114* 94* 91*  ALKPHOS 218* 245* 207* 204*  BILITOT 0.6 0.6 0.8 0.7  PROT 5.7* 5.4* 5.4* 5.8*  ALBUMIN 1.7* 1.6* 1.5* 1.6*   No results for input(s): LIPASE, AMYLASE in the last 168 hours. No results for input(s): AMMONIA in the last 168 hours.  ABG    Component Value Date/Time   TCO2 28 12/10/2021 0036     Coagulation Profile: No results for input(s): INR, PROTIME in the last 168 hours.  Cardiac Enzymes: No results for input(s): CKTOTAL, CKMB, CKMBINDEX, TROPONINI in the last 168 hours.  HbA1C: Hgb A1c MFr Bld  Date/Time Value Ref Range Status  06/14/2021 09:38 PM 6.1 (H) 4.8 - 5.6 % Final    Comment:    (NOTE)         Prediabetes: 5.7 - 6.4         Diabetes: >6.4         Glycemic control for adults with diabetes: <7.0    06/14/2021 04:49 AM 6.2 (H) 4.8 - 5.6 % Final    Comment:    (NOTE)         Prediabetes: 5.7 - 6.4         Diabetes: >6.4         Glycemic control for adults with diabetes: <7.0     CBG: Recent Labs  Lab 02/10/22 1155 02/10/22 1513 02/10/22 1704 02/10/22 2338 02/11/22 0550  GLUCAP 129* 150* 148* 157* 158*    Review of Systems:   Please see the history of present illness. All other systems reviewed and are negative   Past Medical History:  He,  has a past medical history of Colostomy present [facility]), Large bowel obstruction ([place]), and Pancreatic cancer ([place]).   Surgical History:   Past Surgical History:  Procedure Laterality Date   ABDOMINAL SURGERY     BIOPSY  05/20/2021   Procedure:  BIOPSY;  Surgeon: Otis Brace, MD;  Location: [facility];  Service: Gastroenterology;;   BOWEL RESECTION     BOWEL RESECTION     CHOLECYSTECTOMY N/A 12/16/2014   Procedure: LAPAROSCOPIC CHOLECYSTECTOMY WITH INTRAOPERATIVE CHOLANGIOGRAM;  Surgeon: Georganna Skeans, MD;  Location: [place];  Service: General;  Laterality: N/A;   COLONIC STENT PLACEMENT N/A 05/26/2021   Procedure: COLONIC STENT PLACEMENT;  Surgeon: Clarene Essex, MD;  Location: [facility];  Service: Endoscopy;  Laterality: N/A;   CYSTOSCOPY W/ URETERAL STENT PLACEMENT Left 10/27/2021   Procedure: CYSTOSCOPY WITH RETROGRADE PYELOGRAM/URETERAL STENT PLACEMENT;  Surgeon: Raynelle Bring, MD;  Location: [facility];  Service: Urology;  Laterality: Left;   ESOPHAGOGASTRODUODENOSCOPY N/A 05/20/2021   Procedure: ESOPHAGOGASTRODUODENOSCOPY (EGD);  Surgeon: Otis Brace, MD;  Location: [facility];  Service: Gastroenterology;  Laterality: N/A;   EUS N/A 09/24/2015   Procedure: UPPER ENDOSCOPIC ULTRASOUND (EUS) LINEAR;  Surgeon: Milus Banister, MD;  Location: [facility];  Service: Endoscopy;  Laterality: N/A;   FLEXIBLE SIGMOIDOSCOPY N/A 05/20/2021   Procedure: FLEXIBLE SIGMOIDOSCOPY;  Surgeon: Otis Brace, MD;  Location: [facility];  Service: Gastroenterology;  Laterality: N/A;   FLEXIBLE SIGMOIDOSCOPY N/A 05/26/2021   Procedure: FLEXIBLE SIGMOIDOSCOPY;  Surgeon: Clarene Essex, MD;  Location: [facility];  Service: Endoscopy;  Laterality: N/A;   IR NEPHROSTOMY EXCHANGE LEFT  12/23/2021   IR NEPHROSTOMY EXCHANGE LEFT  01/17/2022   IR NEPHROSTOMY PLACEMENT LEFT  11/11/2021   IR NEPHROSTOMY PLACEMENT RIGHT  11/11/2021   LAPAROSCOPIC DISTAL PANCREATECTOMY  10/2017   [facility]   LAPAROSCOPIC LYSIS OF ADHESIONS N/A 02/01/2022   Procedure: LAPAROSCOPIC LYSIS OF ADHESIONS;  Surgeon: Michael Boston, MD;  Location: [facility];  Service: General;  Laterality: N/A;   LAPAROSCOPIC PARTIAL GASTRECTOMY  10/2017   [facility]   LAPAROSCOPIC SIGMOID COLECTOMY N/A 06/14/2021   Procedure: LAPAROSCOPY WITH MOBILIZATION, PROXIMAL DIVERTING COLOSTOMY, CREATION OF DISTAL MUCOUS FISTULA;  Surgeon: Johnathan Hausen, MD;  Location: [facility];  Service: General;  Laterality: N/A;   LAPAROSCOPIC SMALL BOWEL RESECTION N/A 02/01/2022   Procedure: SMALL BOWEL RESECTION;  Surgeon: Michael Boston, MD;  Location: [facility];  Service: General;  Laterality: N/A;   LAPAROSCOPIC SPLENECTOMY  10/2017   [facility]   VASECTOMY       Social History:   reports that he has never smoked. He has never used smokeless tobacco. He reports that he does not drink alcohol and does not use drugs.   Family History:  His family history includes Stroke in his mother.   Allergies Allergies  Allergen Reactions   Pollen Extract Other (See Comments)    Sinus congestion     Home Medications  Prior to Admission medications   Medication Sig Start Date End Date Taking? Authorizing Provider  Multiple Vitamin (MULTIVITAMIN WITH MINERALS) TABS tablet Take 1 tablet by mouth daily.   Yes [provider]  polyethylene glycol (MIRALAX / GLYCOLAX) 17 g packet Take 17 g by mouth 2 (two) times daily. Titrate down to once daily if developing diarrhea. 01/10/22  Yes Mariel Aloe, MD   mirtazapine (REMERON) 15 MG tablet Take 1 tablet (15 mg total) by mouth at bedtime. Patient not taking: Reported on 01/29/2022 12/31/21   Dettinger, [name], MD     Critical care time: NA  Devean Skoczylas D. Kenton Kingfisher, NP-C [facility] Personal contact information can be found on Amion  02/11/2022, 12:58 PM

## 2022-02-11 NOTE — Progress Notes (Signed)
Nutrition Follow-up  DOCUMENTATION CODES:   Severe malnutrition in context of chronic illness, Underweight  INTERVENTION:   -TPN management per Pharmacy  -Continue Ensure Plus High Protein po BID, each supplement provides 350 kcal and 20 grams of protein.  -Continue Boost Breeze po TID, each supplement provides 250 kcal and 9 grams of protein   NUTRITION DIAGNOSIS:   Severe Malnutrition related to chronic illness, cancer and cancer related treatments as evidenced by severe fat depletion, severe muscle depletion, percent weight loss.  Ongoing.  GOAL:   Patient will meet greater than or equal to 90% of their needs  Meeting with TPN.  MONITOR:   Labs, Weight trends, Skin, I & O's (TPN)  REASON FOR ASSESSMENT:   Malnutrition Screening Tool, Consult New TPN/TNA  ASSESSMENT:   81 y.o. male with medical history of stage 4 pancreatic cancer, hydronephrosis s/p nephrostomy, HTN, SBO s/p colectomy, and stage 3 CKD. He presented to the ED due to ongoing abdominal pain x2-3 days with pain being 8/10. He also reported N/V. He was having some ostomy output. He was found to have recurrent SBO and General Surgery was consulted.  2/10: admitted 2/14: s/p dx lap, SB resection, LOA 2/21: CLD 2/22: FLD 2/23: Soft diet  Calorie Count: 2/23 B: 0% L: 0% D: 10% = 75 kcals, 2g protein Supplements: Some Ensure was reported. ~350 kcals, 20g protein  Total: 425 kcals (20% of needs) and 22g protein (20% of needs).  2/24 Per RN, pt has had very little intake other than sips of water. No meal or supplement intakes today.  TPN continues at goal rate of 85 ml/hr, providing 2061 kcals and 112g protein.  Admission weight: 125 lbs. Current weight: 155 lbs.  I/Os: +8.1L since admit UOP: 625 ml x 24 hrs Thoracentesis: 50 ml 2/23  Medications: Multivitamin with minerals daily  Labs reviewed: CBGs: 139-158  Diet Order:   Diet Order             DIET SOFT Room service appropriate?  Yes; Fluid consistency: Thin  Diet effective now                   EDUCATION NEEDS:   Education needs have been addressed  Skin:  Skin Assessment: Skin Integrity Issues: Skin Integrity Issues:: Incisions Incisions: 2/14 -abdomen  Last BM:  2/23 -colostomy  Height:   Ht Readings from Last 1 Encounters:  01/28/22 5\' 11"  (1.803 m)    Weight:   Wt Readings from Last 1 Encounters:  02/10/22 70.4 kg    Ideal Body Weight:  78.2 kg  BMI:  Body mass index is 21.65 kg/m.  Estimated Nutritional Needs:   Kcal:  4287-6811 kcal  Protein:  105-120 grams  Fluid:  >/= 2 L/day  Clayton Bibles, MS, RD, LDN Inpatient Clinical Dietitian Contact information available via Amion

## 2022-02-12 ENCOUNTER — Inpatient Hospital Stay (HOSPITAL_COMMUNITY): Payer: Medicare Other

## 2022-02-12 DIAGNOSIS — K56600 Partial intestinal obstruction, unspecified as to cause: Secondary | ICD-10-CM | POA: Diagnosis not present

## 2022-02-12 DIAGNOSIS — Z7189 Other specified counseling: Secondary | ICD-10-CM | POA: Diagnosis not present

## 2022-02-12 DIAGNOSIS — C252 Malignant neoplasm of tail of pancreas: Secondary | ICD-10-CM | POA: Diagnosis not present

## 2022-02-12 DIAGNOSIS — J869 Pyothorax without fistula: Secondary | ICD-10-CM | POA: Diagnosis not present

## 2022-02-12 LAB — CBC
HCT: 33.1 % — ABNORMAL LOW (ref 39.0–52.0)
Hemoglobin: 10.1 g/dL — ABNORMAL LOW (ref 13.0–17.0)
MCH: 30.2 pg (ref 26.0–34.0)
MCHC: 30.5 g/dL (ref 30.0–36.0)
MCV: 99.1 fL (ref 80.0–100.0)
Platelets: 866 10*3/uL — ABNORMAL HIGH (ref 150–400)
RBC: 3.34 MIL/uL — ABNORMAL LOW (ref 4.22–5.81)
RDW: 18.2 % — ABNORMAL HIGH (ref 11.5–15.5)
WBC: 31.2 10*3/uL — ABNORMAL HIGH (ref 4.0–10.5)
nRBC: 0.1 % (ref 0.0–0.2)

## 2022-02-12 LAB — MAGNESIUM: Magnesium: 2.2 mg/dL (ref 1.7–2.4)

## 2022-02-12 LAB — BASIC METABOLIC PANEL
Anion gap: 4 — ABNORMAL LOW (ref 5–15)
BUN: 55 mg/dL — ABNORMAL HIGH (ref 8–23)
CO2: 23 mmol/L (ref 22–32)
Calcium: 8.2 mg/dL — ABNORMAL LOW (ref 8.9–10.3)
Chloride: 114 mmol/L — ABNORMAL HIGH (ref 98–111)
Creatinine, Ser: 1.19 mg/dL (ref 0.61–1.24)
GFR, Estimated: >60 mL/min (ref >60)
Glucose, Bld: 177 mg/dL — ABNORMAL HIGH (ref 70–99)
Potassium: 3.8 mmol/L (ref 3.5–5.1)
Sodium: 141 mmol/L (ref 135–145)

## 2022-02-12 LAB — PROCALCITONIN: Procalcitonin: 1.37 ng/mL

## 2022-02-12 LAB — GLUCOSE, CAPILLARY
Glucose-Capillary: 152 mg/dL — ABNORMAL HIGH (ref 70–99)
Glucose-Capillary: 145 mg/dL — ABNORMAL HIGH (ref 70–99)
Glucose-Capillary: 96 mg/dL (ref 70–99)

## 2022-02-12 MED ORDER — DIPHENHYDRAMINE HCL 12.5 MG/5ML PO ELIX
12.5000 mg | ORAL_SOLUTION | Freq: Four times a day (QID) | ORAL | Status: DC | PRN
  Administered 2022-02-13: 12.5 mg via ORAL
  Filled 2022-02-12: qty 5

## 2022-02-12 MED ORDER — TRAVASOL 10 % IV SOLN
INTRAVENOUS | Status: AC
  Filled 2022-02-12: qty 528

## 2022-02-12 MED ORDER — SODIUM CHLORIDE 0.9% FLUSH
10.0000 mL | Freq: Three times a day (TID) | INTRAVENOUS | Status: DC
  Administered 2022-02-12 – 2022-02-17 (×15): 10 mL

## 2022-02-12 MED ORDER — STERILE WATER FOR INJECTION IJ SOLN
5.0000 mg | Freq: Once | RESPIRATORY_TRACT | Status: AC
  Administered 2022-02-12: 5 mg via INTRAPLEURAL
  Filled 2022-02-12: qty 5

## 2022-02-12 MED ORDER — DIPHENHYDRAMINE HCL 25 MG PO CAPS: 25.0000 mg | ORAL_CAPSULE | Freq: Four times a day (QID) | ORAL | Status: DC | PRN

## 2022-02-12 MED ORDER — SODIUM CHLORIDE (PF) 0.9 % IJ SOLN
10.0000 mg | Freq: Once | INTRAMUSCULAR | Status: AC
  Administered 2022-02-12: 10 mg via INTRAPLEURAL
  Filled 2022-02-12: qty 10

## 2022-02-12 NOTE — Progress Notes (Signed)
Daily Progress Note   Patient Name: Kevin Villegas       Date: 02/12/2022 DOB: [birth year]  Age: 81 y.o. MRN#: [id number] Attending Physician: Antonieta Pert, MD Primary Care Physician: Dettinger, [name], MD Admit Date: 01/28/2022  Reason for Consultation/Follow-up: Establishing goals of care  Subjective: Case discussed throughout the day today with RN, primary attending, and critical care multiple times.  Initially, Kevin Villegas was refusing consideration for chest tube for loculated effusion.  When I came to discuss this and potential care options if he did not want to have chest tube, he had changed his mind and was open to placement at that point.  This was done this afternoon by PCCM.  I then also discussed with his RN following development of drainage from his abdominal wound.  I saw and examined him at that time.  He appeared to be hemodynamically stable and was awake, alert, and talking with me.  Discussed plan for Dr. Harlow Asa to evaluate later this evening.  RN and I discussed pain management as he has been waking with noted pain.  Reviewed MAR and he did receive 3 doses of fentanyl today, and he has been getting it around every 3 hours.  He can receive it up to every hour as needed.  Discussed potentially transitioning to Dilaudid for longer acting agent, however, there is also concern about confusion and short acting fentanyl is probably less likely to contribute to worsening this.  Additionally, I want to ensure that there is no concerning intra-abdominal process going on and while his vital signs are currently stable, will continue with fentanyl right now as this would be less likely to drop his blood pressure in the event he does have intra-abdominal pathology.  Again, on my exam of him this  afternoon, I think this is likely more an issue of potential wound infection or rather being more concerning intra-abdominal process.  Length of Stay: 14  Current Medications: Scheduled Meds:   Chlorhexidine Gluconate Cloth  6 each Topical Daily   enoxaparin (LOVENOX) injection  40 mg Subcutaneous Q24H   feeding supplement  1 Container Oral TID BM   feeding supplement  237 mL Oral BID BM   insulin aspart  0-15 Units Subcutaneous Q6H   lidocaine  1 patch Transdermal Q24H   lip balm  1 application  Topical BID   methocarbamol  500 mg Oral QID   multivitamin with minerals  1 tablet Oral Daily   sodium chloride flush  10 mL Other Q8H   sodium chloride flush  3 mL Intravenous Q12H    Continuous Infusions:  sodium chloride Stopped (01/28/22 2301)   linezolid (ZYVOX) IV Stopped (02/12/22 0013)   TPN ADULT (ION) 85 mL/hr at 02/12/22 0600    PRN Meds: sodium chloride, acetaminophen, alum & mag hydroxide-simeth, antiseptic oral rinse, diphenhydrAMINE, fentaNYL (SUBLIMAZE) injection, iohexol, menthol-cetylpyridinium, ondansetron (ZOFRAN) IV, oxyCODONE, phenol, prochlorperazine, simethicone, sodium chloride flush  Physical Exam         Appears chronically il But hemodynamically stable Resting in bed NG tube out No edema Awake alert S 1 S 2  ABD pad in place over abdominal wound.  Bloody discharge noted on bedding but does not appear to be actively bleeding  Vital Signs: BP 134/88 (BP Location: Right Arm)    Pulse 73    Temp 97.9 F (36.6 C) (Oral)    Resp 18    Ht 5\' 11"  (1.803 m)    Wt 72.1 kg    SpO2 (!) 88%    BMI 22.17 kg/m  SpO2: SpO2: (!) 88 % O2 Device: O2 Device: Room Air O2 Flow Rate: O2 Flow Rate (L/min): 2 L/min  Intake/output summary:  Intake/Output Summary (Last 24 hours) at 02/12/2022 0831 Last data filed at 02/12/2022 2025 Gross per 24 hour  Intake 3641.17 ml  Output 2000 ml  Net 1641.17 ml    LBM: Last BM Date : 02/11/22 Baseline Weight: Weight: 57 kg Most  recent weight: Weight: 72.1 kg       Palliative Assessment/Data:      Patient Active Problem List   Diagnosis Date Noted   Empyema lung ([place])    Goals of care, counseling/discussion 02/10/2022   Pleural effusion, left 02/10/2022   E-coli UTI 02/01/2022   SBO (small bowel obstruction) ([place]) 01/28/2022   Subcutaneous nodule of abdominal wall 01/09/2022   Partial small bowel obstruction ([place]) 01/07/2022   COVID-19 virus infection 01/07/2022   Colostomy in place ([place]) 11/10/2021   Stage 3a chronic kidney disease (CKD) ([place]) 11/10/2021   Pyelonephritis    Essential hypertension 10/30/2021   Hydronephrosis of left kidney 10/28/2021   Ureteral stricture, left 10/27/2021   Protein-calorie malnutrition, severe 06/12/2021   Hydroureter on right 06/10/2021   Aortic atherosclerosis ([place]) 06/10/2021   Leukocytosis 06/02/2021   Right ureteral stone 06/02/2021   Colon stricture ([place])    Pancreatic mass 06/27/2019   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  with metastatic disease 06/27/2019   Rhinitis medicamentosa 02/26/2019    Palliative Care Assessment & Plan   Patient Profile:    Assessment:  81 year old gentleman who lives at home with his wife in [place], [place].  His daughter lives nearby.  He has a past medical history of stage IV pancreatic cancer, status post colectomy, hydronephrosis status post nephrostomy, underlying hypertension small bowel obstruction stage II chronic kidney disease.  Patient presented with ongoing abdominal pain, known to have concerns for small bowel obstruction issues.  Underwent a diagnostic laparoscopy with lysis of additions on 2-14.  He is being followed by general surgery and medical oncology.  Has been placed on TPN for nutritional support.  Has been placed on NG tube for suction.  So far, patient has not not had any bowel movements or passed flatus. Palliative medicine team has been consulted for CODE STATUS and  goals of care  discussions and for additional support.  Recommendations/Plan: DNR Continue current mode of care.  Unclear etiology of increased WBCs.   Drainage noted from wound.  Surgery to evaluate. Pain: Could consider transition to Dilaudid for longer acting agent.  At this point, however we will continue with current regimen of fentanyl until he is evaluated by surgery and we ensure pressure remains stable as fentanyl would be less likely to cause blood pressure decrease if there is something going on in his abdomen.  Code Status:    Code Status Orders  (From admission, onward)           Start     Ordered   02/06/22 1335  Do not attempt resuscitation (DNR)  Continuous       Question Answer Comment  In the event of cardiac or respiratory ARREST Do not call a code blue   In the event of cardiac or respiratory ARREST Do not perform Intubation, CPR, defibrillation or ACLS   In the event of cardiac or respiratory ARREST Use medication by any route, position, wound care, and other measures to relive pain and suffering. May use oxygen, suction and manual treatment of airway obstruction as needed for comfort.      02/06/22 1334           Code Status History     Date Active Date Inactive Code Status Order ID Comments User Context   01/29/2022 0014 02/06/2022 1334 Full Code [id number]  Marcelyn Bruins, MD Inpatient   01/07/2022 2129 01/10/2022 2258 Full Code [id number]  Rise Patience, MD Inpatient   12/10/2021 1331 12/11/2021 2137 Full Code [id number]  Jonnie Finner, DO ED   11/10/2021 0354 11/13/2021 2039 Full Code [id number]  Chotiner, Yevonne Aline, MD Inpatient   10/27/2021 2317 10/30/2021 1833 Full Code [id number]  Lenore Cordia, MD Inpatient   06/09/2021 2201 06/18/2021 1923 Full Code [id number]  Lenore Cordia, MD Inpatient   06/01/2021 2102 06/05/2021 1830 Full Code [id number]  Rhetta Mura, DO ED   05/18/2021 1200 05/26/2021 1958 Partial Code [id number]  Jonnie Finner, DO Inpatient    12/12/2014 1730 12/17/2014 1704 Full Code [id number]  Velvet Bathe, MD Inpatient       Prognosis:  Guarded   Discharge Planning: To Be Determined  Care plan was discussed with patient.  Thank you for allowing the Palliative Medicine Team to assist in the care of this patient. Micheline Rough, MD  Please contact Palliative Medicine Team phone at [phone] for questions and concerns.

## 2022-02-12 NOTE — Progress Notes (Incomplete)
Pt refused to drink oral contrast for CT abdomen/pelvis. Delora Fuel, RN, stated she would notify the ordering provider.

## 2022-02-12 NOTE — Progress Notes (Signed)
PROGRESS NOTE Kevin Villegas  WUJ:[id number] DOB: 13-Dec-1941 DOA: 01/28/2022 PCP: Dettinger, [name], MD   Brief Narrative/Hospital Course: 81 year old male with stage IV pancreatic cancer status post colectomy, hydronephrosis status post left nephrostomy, hypertension, SBO, CKD stage III who presented with abdominal pain, found to have small bowel obstruction status post diagnostic laparoscopy with lysis of adhesion on 2/40 measuring NG suction n.p.o. while waiting for return of bowel function.  Followed by surgery oncology.  Hospital course complicated by coffee-ground colored secretion from NG tube, leukocytosis. -Repeat CT scan 2/20 no bowel obstruction, no abscess moderate amount of free fluid in the right upper quadrant, body wall edema noted to the right anterior wall. With persistent leukocytosis but afebrile, being managed with TPN, IV antibiotics-.  Patient completed IV Zosyn 2/17-2/22.  Diet was slowly advanced NG tube removed at this time patient is tolerating diet having output in colostomy bag, having output in nephrostomy tube.  CT scan 2/21 no intra-abdominal abscess or obstruction but shows enlarging left pleural effusion.  Patient had persistent leukocytosis ID consulted, underwent left pleural diagnostic tap with 50 mL appears exudative fluid but Gram stain negative, total nucleated cells 25,000, neutrophils100 2/24:placed on Zyvox and underwent chest tube placement    Subjective: Seen and examined this morning.  Patient is lethargic but alert awake interacts answers questions currently no pain Overnight afebrile, pulse ox early this morning 88% on room air. Labs with stable renal function and electrolytes Procalcitonin 1.3, CBC pending On soft diet and TPN. On room air currently-without shortness of breath CBC pending  Assessment and Plan: * Partial small bowel obstruction ([place])- (present on admission) Stage IV pancreatic cancer with partial small bowel obstruction: S/p LOA,  small bowel resection, excisional biopsy of mesenteric mass Dr. [name] on 2/14.  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 CT scan 2/21 without intra-abdominal abscess or obstruction but enlarging left pleural effusion and new right pleural effusion Surgery on board-Off NG tube 2/21, on soft diet, Ensure-but not much eating.  Remains on TPN, continue pain control symptomatic management colostomy care Completed Zosyn ( 2/17>>2/22) for intra-abdominal process patient having drainage from surgical wound-CT abdomen ordered ( 2/25) to evaluate for abdominal wall abscess per [facility].   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)M1  with metastatic disease- (present on admission) See #1.Oncology on board.  Dr. Marin Olp following less likely will be candidate for systemic chemotherapy unless he is able to return home per hem-onc  Empyema of left pleural space (HCC) S/P thick purulent drainage by IR 2/23- PMN 2500, exudative and culture with Enterococcus likely seeding previously known loculated left effusion likely malignant. Appreciate pulmonary input underwent small bore chest tube placement 2/24, ID following and started on Zyvox.  Not a candidate for surgery.  Monitor respiratory status, WBC count   Leukocytosis Worsening of WBC count likely from his empyema, monitor, continue antibiotics see empyema above.  CBC pending today. Recent Labs  Lab 02/07/22 0517 02/08/22 0606 02/09/22 0610 02/10/22 0812 02/11/22 0607  WBC 23.5* 25.7* 29.9* 38.6* 35.5*    E-coli UTI Urine culture with pansensitive E. Coli and completed antibiotic  Hydronephrosis of left kidney- (present on admission) Due to tumor compression, status post left nephrostomy last changed on 1/30.  Discussed with urology previously- recommended renal ultrasound on 2/17 that showed chronic right hydronephrosis with atrophic right kidney advise outpatient follow-up, seen on consult by  urology 2/18.  Nephrostomy draining.  Renal function is stable   Goals of  care, counseling/discussion Palliative care on board,appreciate input.Patient is DNR, plan is to follow-up palliative care at the facility.  Overall prognosis does not appear bright patient continues to be very weak deconditioned now with empyema needing chest tube placement palliative care consultation appreciated hopefully will be able to meet with wife today .  Wife lives in farm [place] to [place].  Continue to address pain management  Stage 3a chronic kidney disease (CKD) ([place])- (present on admission) Slightly AKI with creatinine at 1.59 improved to 1.1 with ivf, tpn.Monitor Recent Labs  Lab 02/07/22 0517 02/08/22 0606 02/09/22 0610 02/10/22 0812 02/12/22 0551  BUN 45* 50* 50* 51*   50* 55*  CREATININE 1.27* 1.39* 1.50* 1.23   1.20 1.19    Colostomy in place [facility]) Having output, cont routine colostomy care.  Essential hypertension- (present on admission) BP stable. Not on meds.  Protein-calorie malnutrition, severe- (present on admission) Augment nutritional status as tolerated.  Dietitian on board.  On Soft diet/TPN and wean TPN as tolerated Nutrition Problem: Severe Malnutrition Etiology: chronic illness, cancer and cancer related treatments Signs/Symptoms: severe fat depletion, severe muscle depletion, percent weight loss Interventions: TPN    DVT prophylaxis: enoxaparin (LOVENOX) injection 40 mg Start: 02/02/22 1000 SCDs Start: 01/29/22 0013 Code Status:   Code Status: DNR Family Communication:plan of care discussed with patient/ wife on Phone 2/21.  Wife updated 2/23. 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   Disposition:Currently not medically stable for discharge. Status KN:[id number] Remains Inpatient appropriate because: ongoing management of SBO/empyema   Objective: Vitals last 24 hrs: Vitals:   02/11/22 1620 02/11/22 1754 02/11/22  2124 02/12/22 0505  BP: 110/77 121/84 125/84 134/88  Pulse:  81 85 73  Resp:  20 18 18   Temp:  98.5 F (36.9 C) 97.6 F (36.4 C) 97.9 F (36.6 C)  TempSrc:  Oral Oral Oral  SpO2: 96% 96% 97% (!) 88%  Weight:    72.1 kg  Height:       Weight change:   Physical Examination: General exam: Ill looking lethargic frail on room air,older than stated age, weak appearing. HEENT:Oral mucosa moist, Ear/Nose WNL grossly, dentition normal. Respiratory system: bilaterally clear breath sounds with small bore chest tube in place Cardiovascular system: S1 & S2 +, No JVD,. Gastrointestinal system: Abdomen soft, mild tenderness bowel sounds present left colostomy in place, nephrostomy tube in place  Nervous System:Alert, awake, moving extremities and grossly nonfocal Extremities: LE ankle edema mild, distal peripheral pulses palpable.  Skin: No rashes,no icterus. MSK: Normal muscle bulk,tone, power   Medications reviewed:  Scheduled Meds:  alteplase (TPA) for intrapleural administration  10 mg Intrapleural Once   And   pulmozyme (DORNASE) for intrapleural administration  5 mg Intrapleural Once   Chlorhexidine Gluconate Cloth  6 each Topical Daily   enoxaparin (LOVENOX) injection  40 mg Subcutaneous Q24H   feeding supplement  1 Container Oral TID BM   feeding supplement  237 mL Oral BID BM   insulin aspart  0-15 Units Subcutaneous Q6H   lidocaine  1 patch Transdermal Q24H   lip balm  1 application Topical BID   methocarbamol  500 mg Oral QID   multivitamin with minerals  1 tablet Oral Daily   sodium chloride flush  10 mL Other Q8H   sodium chloride flush  10 mL Other Q8H   sodium chloride flush  3 mL Intravenous Q12H   Continuous Infusions:  sodium chloride Stopped (01/28/22 2301)  linezolid (ZYVOX) IV Stopped (02/12/22 0013)   TPN ADULT (ION) 85 mL/hr at 02/12/22 0600   TPN ADULT (ION)        Diet Order             Diet NPO time specified  Diet effective now                     Nutrition Problem: Severe Malnutrition Etiology: chronic illness, cancer and cancer related treatments Signs/Symptoms: severe fat depletion, severe muscle depletion, percent weight loss Interventions: TPN   Intake/Output Summary (Last 24 hours) at 02/12/2022 1039 Last data filed at 02/12/2022 1004 Gross per 24 hour  Intake 3641.17 ml  Output 2150 ml  Net 1491.17 ml   Net IO Since Admission: 9,596.37 mL [02/12/22 1039]  Wt Readings from Last 3 Encounters:  02/12/22 72.1 kg  01/07/22 63.5 kg  12/31/21 63.5 kg     Unresulted Labs (From admission, onward)     Start     Ordered   02/13/22 0500  CBC with Differential/Platelet  Daily,   R     Question:  Specimen collection method  Answer:  Lab=Lab collect   02/12/22 0649   02/13/22 0500  Prealbumin  Tomorrow morning,   R       Question:  Specimen collection method  Answer:  Lab=Lab collect   02/12/22 0649   02/13/22 0500  Comprehensive metabolic panel  Daily,   R     Question:  Specimen collection method  Answer:  Lab=Lab collect   02/12/22 0841   02/12/22 1039  CBC  ONCE - STAT,   STAT       Question:  Specimen collection method  Answer:  Lab=Lab collect   02/12/22 1038   01/31/22 0500  Comprehensive metabolic panel  (TPN Lab Panel)  Every Mon,Thu (0500),   R     Question:  Specimen collection method  Answer:  Unit=Unit collect   01/30/22 0742   01/31/22 0500  Magnesium  (TPN Lab Panel)  Every Mon,Thu (0500),   R     Question:  Specimen collection method  Answer:  Unit=Unit collect   01/30/22 0742   01/31/22 0500  Phosphorus  (TPN Lab Panel)  Every Mon,Thu (0500),   R     Question:  Specimen collection method  Answer:  Unit=Unit collect   01/30/22 0742   01/31/22 0500  Triglycerides  (TPN Lab Panel)  Every Monday (0500),   R     Question:  Specimen collection method  Answer:  Unit=Unit collect   01/30/22 0742          Data Reviewed: I have personally reviewed following labs and imaging studies CBC: Recent Labs   Lab 02/07/22 0517 02/08/22 0606 02/09/22 0610 02/10/22 0812 02/11/22 0607  WBC 23.5* 25.7* 29.9* 38.6* 35.5*  HGB 10.6* 10.7* 11.0* 10.7* 10.8*  HCT 33.1* 33.0* 34.1* 33.7* 35.2*  MCV 93.5 93.0 93.7 95.5 97.5  PLT 541* 622* 642* 771* 277*   Basic Metabolic Panel: Recent Labs  Lab 02/07/22 0517 02/08/22 0606 02/09/22 0610 02/10/22 0812 02/12/22 0551  NA 140 139 140 143   142 141  K 3.4* 3.6 3.6 4.0   4.0 3.8  CL 106 105 107 112*   112* 114*  CO2 25 26 25 24   23 23   GLUCOSE 193* 162* 173* 160*   160* 177*  BUN 45* 50* 50* 51*   50* 55*  CREATININE 1.27* 1.39* 1.50* 1.23  1.20 1.19  CALCIUM 8.6* 8.3* 8.3* 8.3*   8.2* 8.2*  MG 2.2  --  2.3 2.3 2.2  PHOS 3.6  --  4.0 3.6  --    GFR: Estimated Creatinine Clearance: 50.5 mL/min (by C-G formula based on SCr of 1.19 mg/dL). Liver Function Tests: Recent Labs  Lab 02/07/22 0517 02/08/22 0606 02/09/22 0610 02/10/22 0812  AST 207* 142* 108* 102*  ALT 122* 114* 94* 91*  ALKPHOS 218* 245* 207* 204*  BILITOT 0.6 0.6 0.8 0.7  PROT 5.7* 5.4* 5.4* 5.8*  ALBUMIN 1.7* 1.6* 1.5* 1.6*   No results for input(s): LIPASE, AMYLASE in the last 168 hours. No results for input(s): AMMONIA in the last 168 hours. Coagulation Profile: No results for input(s): INR, PROTIME in the last 168 hours. Cardiac Enzymes: No results for input(s): CKTOTAL, CKMB, CKMBINDEX, TROPONINI in the last 168 hours. BNP (last 3 results) No results for input(s): PROBNP in the last 8760 hours. HbA1C: No results for input(s): HGBA1C in the last 72 hours. CBG: Recent Labs  Lab 02/11/22 0550 02/11/22 1118 02/11/22 1653 02/11/22 2357 02/12/22 0642  GLUCAP 158* 139* 120* 141* 152*   Lipid Profile: No results for input(s): CHOL, HDL, LDLCALC, TRIG, CHOLHDL, LDLDIRECT in the last 72 hours.  Thyroid Function Tests: No results for input(s): TSH, T4TOTAL, FREET4, T3FREE, THYROIDAB in the last 72 hours. Anemia Panel: No results for input(s): VITAMINB12,  FOLATE, FERRITIN, TIBC, IRON, RETICCTPCT in the last 72 hours. Sepsis Labs: Recent Labs  Lab 02/10/22 4098 02/11/22 0607 02/12/22 0551  PROCALCITON 1.88 1.41 1.37     Recent Results (from the past 240 hour(s))  Culture, blood (routine x 2)     Status: None   Collection Time: 02/04/22  3:42 PM   Specimen: BLOOD  Result Value Ref Range Status   Specimen Description BLOOD LEFT ANTECUBITAL  Final   Special Requests   Final    BOTTLES DRAWN AEROBIC AND ANAEROBIC Blood Culture adequate volume   Culture   Final    NO GROWTH 5 DAYS Performed at [facility], [address] [place], [place] [address]    Report Status 02/09/2022 FINAL  Final  Culture, blood (routine x 2)     Status: None   Collection Time: 02/04/22  3:42 PM   Specimen: BLOOD  Result Value Ref Range Status   Specimen Description BLOOD RIGHT ANTECUBITAL  Final   Special Requests   Final    BOTTLES DRAWN AEROBIC ONLY Blood Culture results may not be optimal due to an inadequate volume of blood received in culture bottles   Culture   Final    NO GROWTH 5 DAYS Performed at [facility], [place] [address], [place], [place] [address]    Report Status 02/09/2022 FINAL  Final  Body fluid culture w Gram Stain     Status: None (Preliminary result)   Collection Time: 02/10/22  1:37 PM   Specimen: PATH Cytology Pleural fluid  Result Value Ref Range Status   Specimen Description   Final    PLEURAL Performed at [place] [address] [place], [place] [address]    Special Requests   Final    NONE Performed at [facility], [place] [address] [place], [place] [address]    Gram Stain FEW WBC SEEN NO ORGANISMS SEEN   Final   Culture   Final    MODERATE ENTEROCOCCUS FAECIUM VANCOMYCIN RESISTANT ENTEROCOCCUS CRITICAL RESULT CALLED TO, READ BACK  BY AND VERIFIED WITH: RN LISA.P AT 1144 ON 02/11/2022 BY T.SAAD. Performed at [facility], [place] [address], [place],  [place] [address]    Report Status PENDING  Incomplete   Organism ID, Bacteria ENTEROCOCCUS FAECIUM  Final      Susceptibility   Enterococcus faecium - MIC*    AMPICILLIN >=32 RESISTANT Resistant     VANCOMYCIN >=32 RESISTANT Resistant     GENTAMICIN SYNERGY SENSITIVE Sensitive     LINEZOLID 2 SENSITIVE Sensitive     * MODERATE ENTEROCOCCUS FAECIUM    Antimicrobials: Anti-infectives (From admission, onward)    Start     Dose/Rate Route Frequency Ordered Stop   02/11/22 1330  linezolid (ZYVOX) IVPB 600 mg        600 mg 300 mL/hr over 60 Minutes Intravenous Every 12 hours 02/11/22 1234     02/04/22 1545  piperacillin-tazobactam (ZOSYN) IVPB 3.375 g        3.375 g 12.5 mL/hr over 240 Minutes Intravenous Every 8 hours 02/04/22 1453 02/09/22 1359   02/01/22 2200  ceFAZolin (ANCEF) IVPB 1 g/50 mL premix  Status:  Discontinued        1 g 100 mL/hr over 30 Minutes Intravenous Every 8 hours 02/01/22 1504 02/04/22 1453   02/01/22 0930  cefoTEtan (CEFOTAN) 2 g in sodium chloride 0.9 % 100 mL IVPB        2 g 200 mL/hr over 30 Minutes Intravenous On call to O.R. 01/31/22 1908 02/01/22 1533   01/30/22 1000  cefTRIAXone (ROCEPHIN) 1 g in sodium chloride 0.9 % 100 mL IVPB  Status:  Discontinued        1 g 200 mL/hr over 30 Minutes Intravenous Every 24 hours 01/29/22 0013 02/01/22 1504   01/28/22 1830  cefTRIAXone (ROCEPHIN) 1 g in sodium chloride 0.9 % 100 mL IVPB        1 g 200 mL/hr over 30 Minutes Intravenous  Once 01/28/22 1828 01/28/22 1927      Culture/Microbiology    Component Value Date/Time   SDES  02/10/2022 1337    PLEURAL Performed at [facility], [place] [address] [place], [place] [address]    SPECREQUEST  02/10/2022 1337    NONE Performed at [facility], [place] [address] [place], [place] [address]    CULT  02/10/2022 1337    MODERATE ENTEROCOCCUS FAECIUM VANCOMYCIN RESISTANT ENTEROCOCCUS CRITICAL RESULT CALLED TO, READ BACK BY AND VERIFIED  WITH: RN LISA.P AT 1144 ON 02/11/2022 BY T.SAAD. Performed at [facility], [place] [address] [place],  [address]    REPTSTATUS PENDING 02/10/2022 1337  Radiology Studies: DG Chest 1 View  Result Date: 02/10/2022 CLINICAL DATA:  Status post thoracentesis EXAM: CHEST  1 VIEW COMPARISON:  02/09/2022 FINDINGS: Numerous bilateral pulmonary nodules. Trace right pleural effusion. Loculated left pleural effusion. No significant interval change compared with the prior exam. No pneumothorax. Stable cardiomediastinal silhouette. Right-sided Port-A-Cath in satisfactory position. No acute osseous abnormality. IMPRESSION: 1. No significant interval change compared with the prior exam. No pneumothorax. 2. Numerous bilateral pulmonary nodules. 3. Trace right pleural effusion. 4. Loculated left pleural effusion. Electronically Signed   By: Kathreen Devoid M.D.   On: 02/10/2022 13:55   DG CHEST PORT 1 VIEW  Result Date: 02/11/2022 CLINICAL DATA:  Left thoracentesis EXAM: PORTABLE CHEST 1 VIEW COMPARISON:  Previous studies including the examination of 02/10/2022 FINDINGS: There is interval placement of pigtail left chest tube with its tip in the  lateral aspect of left lower lung fields. There is interval decrease in amount of left pleural effusion. Still, there is moderate residual effusion on the left side. Evaluation of left lower lung fields for infiltrates is limited by the effusion. There are patchy densities in the right lower lung fields with interval improvement. There are scattered nodular densities in the visualized portions of lung fields suggesting pulmonary metastatic disease. There is no pneumothorax. IMPRESSION: There is interval decrease in left pleural effusion after placement of left chest tube. Still, there is moderate residual left pleural effusion. There are scattered noncalcified nodules in both lungs suggesting pulmonary metastatic disease. Electronically Signed   By: Elmer Picker M.D.    On: 02/11/2022 20:06   US THORACENTESIS ASP PLEURAL SPACE W/IMG GUIDE  Result Date: 02/10/2022 INDICATION: Patient with metastatic pancreatic cancer presents today with left pleural effusion. Interventional radiology asked to perform a diagnostic and therapeutic thoracentesis. EXAM: ULTRASOUND GUIDED THORACENTESIS MEDICATIONS: 1% lidocaine 10 mL COMPLICATIONS: None immediate. PROCEDURE: An ultrasound guided thoracentesis was thoroughly discussed with the patient and questions answered. The benefits, risks, alternatives and complications were also discussed. The patient understands and wishes to proceed with the procedure. Written consent was obtained. Ultrasound was performed to localize and mark an adequate pocket of fluid in the left chest. Loculated collection identified. The area was then prepped and draped in the normal sterile fashion. 1% Lidocaine was used for local anesthesia. Under ultrasound guidance a 6 Fr Safe-T-Centesis catheter was introduced. Thoracentesis was performed. The catheter was removed and a dressing applied. FINDINGS: A total of approximately 50 mL of light red/thick/purulent fluid was removed. Samples were sent to the laboratory as requested by the clinical team. IMPRESSION: Successful ultrasound guided left thoracentesis yielding 50 mL of pleural fluid. Read by: Soyla Dryer, NP Electronically Signed   By: Aletta Edouard M.D.   On: 02/10/2022 14:38     LOS: 14 days   Antonieta Pert, MD [facility]  02/12/2022, 10:39 AM

## 2022-02-12 NOTE — Progress Notes (Signed)
PHARMACY - TOTAL PARENTERAL NUTRITION CONSULT NOTE   Indication: Small bowel obstruction and inability to meet nutritional needs w/ PO intake  Patient Measurements: Height: 5\' 11"  (180.3 cm) Weight: 72.1 kg (158 lb 15.2 oz) IBW/kg (Calculated) : 75.3 TPN AdjBW (KG): 57.7 Body mass index is 22.17 kg/m. Usual Weight: Weight noted in November 2022 to be 63.5 kg  Assessment: 81 yo male presenting to [facility] ED for abdominal pain, nausea, vomiting. PMH includes pancreatic cancer with evidence of metastatic disease to his bladder, lungs, and pleural space and history of SBO.  S/p stent placement between stomach and intestines and colostomy placement in June 2022. Pharmacy is consulted to begin TPN for possible bridge to surgery given low pre-albumin and inability to meet nutritional needs w/ PO intake.   Glucose / Insulin: No hx DM; CBGs acceptable (max 152, no lows) on moderate SSI q6h - 7 units SSI used yesterday Electrolytes: Ca and Cl slightly elevated; all others stable WNL Renal: SCr slightly elevated but continues to improve; BUN elevated but stable ~50s; UOP remains adequate Hepatic: (2/23) LFTs slightly elevated but trending down; Tbili stable WNL; albumin low/stable - TG stable WNL Intake / Output; MIVF: net + 8L this admission (assuming full charting); not on diuretics - no IVF - no stool charted from ostomy, although Surgery note mentions output - 2/24 start of calorie count - Per RN, no n/v but refusing POs d/t lack of appetite GI Imaging: - 2/10 CT abdomen pelvis: re-demonstration of high-grade partial SBO with transition point in the upper left pelvis psych likely due to adhesion.  - 2/20 abd CT: No evidence of postoperative intra-abdominal abscess. BL pleural effusions. Pulmonary metastatic disease in the lung bases with multiple pulmonary nodules. GI Surgeries / Procedures:  - 2/14: laparoscopic lysis of adhesions, small bowel resection, excisional bipsy of mesentery mass    Central access: PAC TPN start date: 2/12  Nutritional Goals: Goal TPN rate is 85 mL/hr (provides 112g of protein and 2061 kcals per day)  RD Assessment:  Estimated Needs Total Energy Estimated Needs: 2050-2250 kcal Total Protein Estimated Needs: 105-120 grams Total Fluid Estimated Needs: >/= 2 L/day  Current Nutrition:  Soft diet Boost Breeze TID, each provides 250 kcal and 9 grams of protein Ensure plus BID TPN  Plan:  Soft diet plus supplements with Boost Breeze & Ensure plus. 2nd day of 48 hour calorie count At 1800: - Reduce TPN to 40 mL/hr; try to max out remaining calorie count - Electrolytes in TPN: decr Cl, Ca Na 187mEq/L K 39mEq/L Ca 62mEq/L Mg 5 mEq/L Phos 55mmol/L Cl:Ac max Ac - MVI PO   - continue moderate SSI q6h and adjust as needed. - TPN labs on Mon/Thurs - f/u tolerance of PO diet and ability to wean [place], PharmD, BCPS [phone] 02/12/2022, 8:20 AM

## 2022-02-12 NOTE — TOC Progression Note (Signed)
Transition of Care [facility]) - Progression Note    Patient Details  Name: Kevin Villegas MRN: [id number] Date of Birth: 07/31/1941  Transition of Care [facility]) CM/SW Contact  Demani Mcbrien, Marta Lamas,  Phone Number: 02/12/2022, 3:42 PM  Clinical Narrative:     Several unsuccessful attempts made to try and contact both patient and wife today to provide list of bed offers received thus far, which include:   [facility] and [facility] HIPAA compliant messages left on voicemail.  TOC CM/SW will follow-up on 02/26.   Expected Discharge Plan: Skilled Nursing Facility Barriers to Discharge: Continued Medical Work up  Expected Discharge Plan and Services Expected Discharge Plan: [place]   Discharge Planning Services: CM Consult  Social Determinants of Health (SDOH) Interventions  N/A  Readmission Risk Interventions Readmission Risk Prevention Plan 02/03/2022 06/16/2021 06/11/2021  Transportation Screening Complete Complete Complete  PCP or Specialist Appt within 3-5 Days - - Complete  HRI or [place] - - Complete  Social Work Consult for [place] Planning/Counseling - - Complete  Palliative Care Screening - - Not Applicable  Medication Review Press photographer) Complete Complete Complete  PCP or Specialist appointment within 3-5 days of discharge Complete Complete -  [place] or Home Care Consult Complete Complete -  SW Recovery Care/Counseling Consult Complete Complete -  Palliative Care Screening Not Applicable Not Applicable -  [place] Not Applicable Not Applicable -  Some recent data might be hidden

## 2022-02-12 NOTE — Progress Notes (Signed)
11 Days Post-Op   Subjective/Chief Complaint: Complains of not feeling well. Wants to go home   Objective: Vital signs in last 24 hours: Temp:  [97.6 F (36.4 C)-98.5 F (36.9 C)] 97.9 F (36.6 C) (02/25 0505) Pulse Rate:  [73-85] 73 (02/25 0505) Resp:  [18-20] 18 (02/25 0505) BP: (110-134)/(77-90) 134/88 (02/25 0505) SpO2:  [88 %-100 %] 88 % (02/25 0505) Weight:  [72.1 kg] 72.1 kg (02/25 0505) Last BM Date : 02/11/22  Intake/Output from previous day: 02/24 0701 - 02/25 0700 In: 3641.2 [I.V.:3041.3; IV Piggyback:599.9] Out: 1800 [Urine:1350; Chest Tube:450] Intake/Output this shift: Total I/O In: -  Out: 200 [Urine:200]  General appearance: alert and cooperative Resp: clear to auscultation bilaterally Cardio: regular rate and rhythm GI: soft, tendern at suprapubic area. Small opening suprapubic incision draining pus  Lab Results:  Recent Labs    02/10/22 0812 02/11/22 0607  WBC 38.6* 35.5*  HGB 10.7* 10.8*  HCT 33.7* 35.2*  PLT 771* 854*   BMET Recent Labs    02/10/22 0812 02/12/22 0551  NA 143   142 141  K 4.0   4.0 3.8  CL 112*   112* 114*  CO2 24   23 23   GLUCOSE 160*   160* 177*  BUN 51*   50* 55*  CREATININE 1.23   1.20 1.19  CALCIUM 8.3*   8.2* 8.2*   PT/INR No results for input(s): LABPROT, INR in the last 72 hours. ABG No results for input(s): PHART, HCO3 in the last 72 hours.  Invalid input(s): PCO2, PO2  Studies/Results: DG Chest 1 View  Result Date: 02/10/2022 CLINICAL DATA:  Status post thoracentesis EXAM: CHEST  1 VIEW COMPARISON:  02/09/2022 FINDINGS: Numerous bilateral pulmonary nodules. Trace right pleural effusion. Loculated left pleural effusion. No significant interval change compared with the prior exam. No pneumothorax. Stable cardiomediastinal silhouette. Right-sided Port-A-Cath in satisfactory position. No acute osseous abnormality. IMPRESSION: 1. No significant interval change compared with the prior exam. No pneumothorax. 2.  Numerous bilateral pulmonary nodules. 3. Trace right pleural effusion. 4. Loculated left pleural effusion. Electronically Signed   By: Kathreen Devoid M.D.   On: 02/10/2022 13:55   DG CHEST PORT 1 VIEW  Result Date: 02/11/2022 CLINICAL DATA:  Left thoracentesis EXAM: PORTABLE CHEST 1 VIEW COMPARISON:  Previous studies including the examination of 02/10/2022 FINDINGS: There is interval placement of pigtail left chest tube with its tip in the lateral aspect of left lower lung fields. There is interval decrease in amount of left pleural effusion. Still, there is moderate residual effusion on the left side. Evaluation of left lower lung fields for infiltrates is limited by the effusion. There are patchy densities in the right lower lung fields with interval improvement. There are scattered nodular densities in the visualized portions of lung fields suggesting pulmonary metastatic disease. There is no pneumothorax. IMPRESSION: There is interval decrease in left pleural effusion after placement of left chest tube. Still, there is moderate residual left pleural effusion. There are scattered noncalcified nodules in both lungs suggesting pulmonary metastatic disease. Electronically Signed   By: Elmer Picker M.D.   On: 02/11/2022 20:06   US THORACENTESIS ASP PLEURAL SPACE W/IMG GUIDE  Result Date: 02/10/2022 INDICATION: Patient with metastatic pancreatic cancer presents today with left pleural effusion. Interventional radiology asked to perform a diagnostic and therapeutic thoracentesis. EXAM: ULTRASOUND GUIDED THORACENTESIS MEDICATIONS: 1% lidocaine 10 mL COMPLICATIONS: None immediate. PROCEDURE: An ultrasound guided thoracentesis was thoroughly discussed with the patient and questions answered. The benefits,  risks, alternatives and complications were also discussed. The patient understands and wishes to proceed with the procedure. Written consent was obtained. Ultrasound was performed to localize and mark an  adequate pocket of fluid in the left chest. Loculated collection identified. The area was then prepped and draped in the normal sterile fashion. 1% Lidocaine was used for local anesthesia. Under ultrasound guidance a 6 Fr Safe-T-Centesis catheter was introduced. Thoracentesis was performed. The catheter was removed and a dressing applied. FINDINGS: A total of approximately 50 mL of light red/thick/purulent fluid was removed. Samples were sent to the laboratory as requested by the clinical team. IMPRESSION: Successful ultrasound guided left thoracentesis yielding 50 mL of pleural fluid. Read by: Soyla Dryer, NP Electronically Signed   By: Aletta Edouard M.D.   On: 02/10/2022 14:38    Anti-infectives: Anti-infectives (From admission, onward)    Start     Dose/Rate Route Frequency Ordered Stop   02/11/22 1330  linezolid (ZYVOX) IVPB 600 mg        600 mg 300 mL/hr over 60 Minutes Intravenous Every 12 hours 02/11/22 1234     02/04/22 1545  piperacillin-tazobactam (ZOSYN) IVPB 3.375 g        3.375 g 12.5 mL/hr over 240 Minutes Intravenous Every 8 hours 02/04/22 1453 02/09/22 1359   02/01/22 2200  ceFAZolin (ANCEF) IVPB 1 g/50 mL premix  Status:  Discontinued        1 g 100 mL/hr over 30 Minutes Intravenous Every 8 hours 02/01/22 1504 02/04/22 1453   02/01/22 0930  cefoTEtan (CEFOTAN) 2 g in sodium chloride 0.9 % 100 mL IVPB        2 g 200 mL/hr over 30 Minutes Intravenous On call to O.R. 01/31/22 1908 02/01/22 1533   01/30/22 1000  cefTRIAXone (ROCEPHIN) 1 g in sodium chloride 0.9 % 100 mL IVPB  Status:  Discontinued        1 g 200 mL/hr over 30 Minutes Intravenous Every 24 hours 01/29/22 0013 02/01/22 1504   01/28/22 1830  cefTRIAXone (ROCEPHIN) 1 g in sodium chloride 0.9 % 100 mL IVPB        1 g 200 mL/hr over 30 Minutes Intravenous  Once 01/28/22 1828 01/28/22 1927       Assessment/Plan: s/p Procedure(s): LAPAROSCOPIC LYSIS OF ADHESIONS (N/A) SMALL BOWEL RESECTION (N/A) Will get CT  today to evaluate for abd wall abscess Stage IV pancreatic cancer with psbo  - POD11 s/p Lap LOA, Small bowel resection, excisional biopsy of mesentery mass by Dr. 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 May be reactive in setting of metastatic disease.  - CT scan 2/21 without intra abdominal abscess or obstruction. Some free fluid in the pelvis and RUQ postoperatively. Enlarging left pleural effusion and new right pleural effusion.  - Having colostomy output. Has not tried soft foods yet. Low appetite but says he will try today - continue TPN, consulted RD for calorie count and consider weaning TPN. CC start 2/23 - PT/OT - recc snf with possible progression to [facility]   FEN: SOFT, ensure. CC. TPN ID: cefotetan periop, ancef (UTI), zosyn 2/17>2/21 VTE: lovenox   E coli UTI CKD 3A HTN  LOS: 14 days    Kevin Villegas 02/12/2022

## 2022-02-12 NOTE — Progress Notes (Signed)
Kevin Villegas is just having a tough time.  He now has a chest tube in.  He had some pleural fluid taken out a couple days ago.  Shockingly, this is growing Enterococcus faecium.  This is typically a GI organism.  He now has a chest tube in to try to drain the fluid.  He is on linezolid.  He is incredibly weak.  I am not sure how much he really is eating.  He is still on TNA.  His albumin 2 days ago was 1.6.  Had a marked leukocytosis yesterday.  Had thrombocytosis.  He is always in a degree of thrombocytosis.  I suspect this probably is from his underlying malignancy.  He will be interesting to see if the white cell count decreases with the treatment of the Enterococcus.  Again, he really has had a tough time after surgery.  I am not sure that he is going to be a candidate for any systemic therapy after he gets home unless he really makes a marked improvement in his status.  There is still some element of confusion.  This might be from deconditioning.  He also might be from medications.  Again, I just hate the fact that he now has a chest tube in.  I am not sure why he would be grown Enterococcus in the pleural effusion.  Somehow, this must be coming from the GI tract.  We will just continue to follow along.  He is a DO NOT RESUSCITATE which is totally appropriate.  I know that his case is incredibly complicated.  Everybody is really doing a great job trying to help him and trying to address all of the issues that are ongoing.  Lattie Haw, MD  Darlyn Chamber 29:11

## 2022-02-13 ENCOUNTER — Encounter (HOSPITAL_COMMUNITY): Payer: Medicare Other

## 2022-02-13 ENCOUNTER — Inpatient Hospital Stay (HOSPITAL_COMMUNITY): Payer: Medicare Other

## 2022-02-13 DIAGNOSIS — K56600 Partial intestinal obstruction, unspecified as to cause: Secondary | ICD-10-CM | POA: Diagnosis not present

## 2022-02-13 DIAGNOSIS — R109 Unspecified abdominal pain: Secondary | ICD-10-CM | POA: Diagnosis not present

## 2022-02-13 DIAGNOSIS — Z7189 Other specified counseling: Secondary | ICD-10-CM | POA: Diagnosis not present

## 2022-02-13 DIAGNOSIS — J869 Pyothorax without fistula: Secondary | ICD-10-CM | POA: Diagnosis not present

## 2022-02-13 DIAGNOSIS — R600 Localized edema: Secondary | ICD-10-CM | POA: Insufficient documentation

## 2022-02-13 DIAGNOSIS — C252 Malignant neoplasm of tail of pancreas: Secondary | ICD-10-CM | POA: Diagnosis not present

## 2022-02-13 LAB — COMPREHENSIVE METABOLIC PANEL
ALT: 65 U/L — ABNORMAL HIGH (ref 0–44)
AST: 83 U/L — ABNORMAL HIGH (ref 15–41)
Albumin: <1.5 g/dL — ABNORMAL LOW (ref 3.5–5.0)
Alkaline Phosphatase: 224 U/L — ABNORMAL HIGH (ref 38–126)
Anion gap: 7 (ref 5–15)
BUN: 52 mg/dL — ABNORMAL HIGH (ref 8–23)
CO2: 26 mmol/L (ref 22–32)
Calcium: 8.5 mg/dL — ABNORMAL LOW (ref 8.9–10.3)
Chloride: 109 mmol/L (ref 98–111)
Creatinine, Ser: 1.24 mg/dL (ref 0.61–1.24)
GFR, Estimated: 59 mL/min — ABNORMAL LOW (ref >60)
Glucose, Bld: 113 mg/dL — ABNORMAL HIGH (ref 70–99)
Potassium: 4.5 mmol/L (ref 3.5–5.1)
Sodium: 142 mmol/L (ref 135–145)
Total Bilirubin: 0.3 mg/dL (ref 0.3–1.2)
Total Protein: 5.7 g/dL — ABNORMAL LOW (ref 6.5–8.1)

## 2022-02-13 LAB — GLUCOSE, CAPILLARY
Glucose-Capillary: 132 mg/dL — ABNORMAL HIGH (ref 70–99)
Glucose-Capillary: 93 mg/dL (ref 70–99)
Glucose-Capillary: 121 mg/dL — ABNORMAL HIGH (ref 70–99)
Glucose-Capillary: 98 mg/dL (ref 70–99)

## 2022-02-13 LAB — CBC WITH DIFFERENTIAL/PLATELET
Abs Immature Granulocytes: 0.69 10*3/uL — ABNORMAL HIGH (ref 0.00–0.07)
Basophils Absolute: 0.1 10*3/uL (ref 0.0–0.1)
Basophils Relative: 1 %
Eosinophils Absolute: 0.5 10*3/uL (ref 0.0–0.5)
Eosinophils Relative: 2 %
HCT: 33.7 % — ABNORMAL LOW (ref 39.0–52.0)
Hemoglobin: 10.4 g/dL — ABNORMAL LOW (ref 13.0–17.0)
Immature Granulocytes: 3 %
Lymphocytes Relative: 8 %
Lymphs Abs: 1.8 10*3/uL (ref 0.7–4.0)
MCH: 29.9 pg (ref 26.0–34.0)
MCHC: 30.9 g/dL (ref 30.0–36.0)
MCV: 96.8 fL (ref 80.0–100.0)
Monocytes Absolute: 1 10*3/uL (ref 0.1–1.0)
Monocytes Relative: 4 %
Neutro Abs: 20.2 10*3/uL — ABNORMAL HIGH (ref 1.7–7.7)
Neutrophils Relative %: 82 %
Platelets: 1086 10*3/uL (ref 150–400)
RBC: 3.48 MIL/uL — ABNORMAL LOW (ref 4.22–5.81)
RDW: 17.9 % — ABNORMAL HIGH (ref 11.5–15.5)
WBC: 24.3 10*3/uL — ABNORMAL HIGH (ref 4.0–10.5)
nRBC: 0.1 % (ref 0.0–0.2)

## 2022-02-13 LAB — PREALBUMIN: Prealbumin: 8.1 mg/dL — ABNORMAL LOW (ref 18–38)

## 2022-02-13 MED ORDER — TRAVASOL 10 % IV SOLN
INTRAVENOUS | Status: AC
  Filled 2022-02-13: qty 1122

## 2022-02-13 MED ORDER — TRAVASOL 10 % IV SOLN: INTRAVENOUS | Status: DC

## 2022-02-13 NOTE — Progress Notes (Signed)
NAME:  Norton Bivins, MRN:  [id number], DOB:  Dec 23, 1940, LOS: 83 ADMISSION DATE:  01/28/2022, CONSULTATION DATE:  02/11/2022 REFERRING MD:  Dr. Antonieta Pert, CHIEF COMPLAINT:  Pleural effusion   History of Present Illness:  Joaovictor Krone is a 81 year old unfortunate man with metastatic pancreatic cancer.  He was admitted 2/9 for recurrent small bowel obstruction and on 2/14 underwent lysis of adhesions, small bowel resection and excision biopsy of mesenteric mass by Dr. 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 He was  treated for E. coli UTI   He also has a history of left hydronephrosis status post nephrostomy tube with stent, chronic right renal atrophy and hydroureteronephrosis   Review of his serial imaging dating back to November of last year shows metastatic lung nodules with left pleural disease and a small to moderate size left effusion. He had persistent leukocytosis and underwent left thoracentesis 2/23 , only 50 cc of thick right purulent fluid was removed. Pleural fluid results shows 25K cells with 100% neutrophils, culture showing Enterococcus.  Pertinent  Medical History  Metastatic pancreatic cancer, large bowel obstruction resulting in colectomy and colostomy  Significant Hospital Events: Including procedures, antibiotic start and stop dates in addition to other pertinent events   2/10 admitted  2/14 Underwent lysis of adhesions and laparoscopic small bowel resection with Dr. [name] 2/23 underwent left thoracentesis with 80ml fluid removed 2/24 Pulmonary consulted for management of loculated pleural effusion   Images  1.No evidence of postoperative intra-abdominal abscess. Moderate right upper quadrant free fluid and small amount of free fluid in the pelvis is non organized. 2. Enteric tube terminates at the gastroesophageal junction. 3. Left nephrostomy and left nephroureteral stent in place without left hydronephrosis. Chronic right  renal atrophy and hydroureteronephrosis. 4. Pulmonary metastatic disease in the lung bases with multiple pulmonary nodules. Enlarging left pleural effusion with pleural thickening, possibly metastatic. Moderate-sized right pleural effusion is new. 5. Diffuse body wall edema. 6. Soft tissue nodule in the right anterior abdominal wall is unchanged, possibly metastatic. 7. Additional chronic findings are unchanged.  Interim History / Subjective:    Afebrile. Reports pain at chest tube site 900 cc chest tube output  Objective   Blood pressure (!) 136/93, pulse 77, temperature (!) 97.5 F (36.4 C), temperature source Oral, resp. rate 16, height 5\' 11"  (1.803 m), weight 69.7 kg, SpO2 94 %.        Intake/Output Summary (Last 24 hours) at 02/13/2022 1139 Last data filed at 02/13/2022 1018 Gross per 24 hour  Intake 1027.96 ml  Output 2400 ml  Net -1372.04 ml    Filed Weights   02/10/22 0518 02/12/22 0505 02/13/22 0518  Weight: 70.4 kg 72.1 kg 69.7 kg    Examination: General: Acute on chronically ill appearing deconditioned frail elderly male lying in bed in NAD HEENT: [place]/AT, MM pink/moist, PERRL,  Neuro: Nonfocal, alert oriented x3, lethargic CV: s1s2 regular rate and rhythm, no murmur, rubs, or gallops,  PULM: No accessory muscle use, decreased breath sounds on left, no pleural variation on chest tube GI: soft, bowel sounds active in all 4 quadrants, non-tender, slightly distended, left nephrostomy tube  Extremities: warm/dry, no edema  Skin: no rashes or lesions  Resolved Hospital Problem list     Assessment & Plan:  Left empyema -with Enterococcus likely seeding previously known loculated left effusion, likely malignant -CT ABD pelvis revealed pulmonary metastatic disease in the lung bases with multiple pulmonary nodules. Enlarging left pleural effusion with pleural  thickening, possibly metastatic and moderate-sized right pleural effusion -IR preformed left thoracentesis  2/23 with 50 ml of light red/purulent/thick fluid removed  P:  Continue Zyvox Chest x-ray/CT abdomen shows partial evacuation of fluid. We will place intrapleural lytics with tPA and Pulmozyme to break up loculations and help with evacuation  Best Practice (right click and "Reselect all SmartList Selections" daily)  Per primary   Labs   CBC: Recent Labs  Lab 02/09/22 0610 02/10/22 0812 02/11/22 0607 02/12/22 1039 02/13/22 0623  WBC 29.9* 38.6* 35.5* 31.2* 24.3*  NEUTROABS  --   --   --   --  20.2*  HGB 11.0* 10.7* 10.8* 10.1* 10.4*  HCT 34.1* 33.7* 35.2* 33.1* 33.7*  MCV 93.7 95.5 97.5 99.1 96.8  PLT 642* 771* 854* 866* 1,086*     Basic Metabolic Panel: Recent Labs  Lab 02/07/22 0517 02/08/22 0606 02/09/22 0610 02/10/22 0812 02/12/22 0551 02/13/22 0623  NA 140 139 140 143   142 141 142  K 3.4* 3.6 3.6 4.0   4.0 3.8 4.5  CL 106 105 107 112*   112* 114* 109  CO2 25 26 25 24   23 23 26   GLUCOSE 193* 162* 173* 160*   160* 177* 113*  BUN 45* 50* 50* 51*   50* 55* 52*  CREATININE 1.27* 1.39* 1.50* 1.23   1.20 1.19 1.24  CALCIUM 8.6* 8.3* 8.3* 8.3*   8.2* 8.2* 8.5*  MG 2.2  --  2.3 2.3 2.2  --   PHOS 3.6  --  4.0 3.6  --   --     GFR: Estimated Creatinine Clearance: 46.8 mL/min (by C-G formula based on SCr of 1.24 mg/dL). Recent Labs  Lab 02/10/22 0812 02/11/22 0607 02/12/22 0551 02/12/22 1039 02/13/22 0623  PROCALCITON 1.88 1.41 1.37  --   --   WBC 38.6* 35.5*  --  31.2* 24.3*     Liver Function Tests: Recent Labs  Lab 02/07/22 0517 02/08/22 0606 02/09/22 0610 02/10/22 0812 02/13/22 0623  AST 207* 142* 108* 102* 83*  ALT 122* 114* 94* 91* 65*  ALKPHOS 218* 245* 207* 204* 224*  BILITOT 0.6 0.6 0.8 0.7 0.3  PROT 5.7* 5.4* 5.4* 5.8* 5.7*  ALBUMIN 1.7* 1.6* 1.5* 1.6* <1.5*    No results for input(s): LIPASE, AMYLASE in the last 168 hours. No results for input(s): AMMONIA in the last 168 hours.  ABG    Component Value Date/Time   TCO2 28  12/10/2021 0036     Kara Mead MD. FCCP. [facility] Pager : [phone]  If no response to pager , please call [phone] until 7 pm After 7:00 pm call Elink  [phone]   02/13/2022   02/13/2022, 11:39 AM

## 2022-02-13 NOTE — Progress Notes (Signed)
NAME:  Kevin Villegas, MRN:  [id number], DOB:  1941-02-06, LOS: 95 ADMISSION DATE:  01/28/2022, CONSULTATION DATE:  02/11/2022 REFERRING MD:  Dr. Antonieta Pert, CHIEF COMPLAINT:  Pleural effusion   History of Present Illness:  Kevin Villegas is a 81 year old unfortunate man with metastatic pancreatic cancer.  He was admitted 2/9 for recurrent small bowel obstruction and on 2/14 underwent lysis of adhesions, small bowel resection and excision biopsy of mesenteric mass by Dr. 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 He was  treated for E. coli UTI   He also has a history of left hydronephrosis status post nephrostomy tube with stent, chronic right renal atrophy and hydroureteronephrosis   Review of his serial imaging dating back to November of last year shows metastatic lung nodules with left pleural disease and a small to moderate size left effusion. He had persistent leukocytosis and underwent left thoracentesis 2/23 , only 50 cc of thick right purulent fluid was removed. Pleural fluid results shows 25K cells with 100% neutrophils, culture showing Enterococcus.  Pertinent  Medical History  Metastatic pancreatic cancer, large bowel obstruction resulting in colectomy and colostomy  Significant Hospital Events: Including procedures, antibiotic start and stop dates in addition to other pertinent events   2/10 admitted  2/14 Underwent lysis of adhesions and laparoscopic small bowel resection with Dr. [name] 2/23 underwent left thoracentesis with 45ml fluid removed 2/24 Pulmonary consulted for management of loculated pleural effusion , pigtail placed 2/25 lytics #1  Images  1.No evidence of postoperative intra-abdominal abscess. Moderate right upper quadrant free fluid and small amount of free fluid in the pelvis is non organized. 2. Enteric tube terminates at the gastroesophageal junction. 3. Left nephrostomy and left nephroureteral stent in place without left  hydronephrosis. Chronic right renal atrophy and hydroureteronephrosis. 4. Pulmonary metastatic disease in the lung bases with multiple pulmonary nodules. Enlarging left pleural effusion with pleural thickening, possibly metastatic. Moderate-sized right pleural effusion is new. 5. Diffuse body wall edema. 6. Soft tissue nodule in the right anterior abdominal wall is unchanged, possibly metastatic. 7. Additional chronic findings are unchanged.  Interim History / Subjective:   Afebrile. Complains of pain at chest tube site. Saturation 94% on room air 650 cc chest tube output  Objective   Blood pressure (!) 136/93, pulse 77, temperature (!) 97.5 F (36.4 C), temperature source Oral, resp. rate 16, height 5\' 11"  (1.803 m), weight 69.7 kg, SpO2 94 %.        Intake/Output Summary (Last 24 hours) at 02/13/2022 1144 Last data filed at 02/13/2022 1018 Gross per 24 hour  Intake 1027.96 ml  Output 2400 ml  Net -1372.04 ml    Filed Weights   02/10/22 0518 02/12/22 0505 02/13/22 0518  Weight: 70.4 kg 72.1 kg 69.7 kg    Examination: General: Acute on chronically ill appearing deconditioned frail elderly male lying in bed in NAD HEENT: [place]/AT, MM pink/moist, PERRL,  Neuro: Nonfocal, alert oriented x3, lethargic CV: s1s2 regular rate and rhythm, no murmur, rubs, or gallops,  PULM: No respiratory variation in Pleur-evac, decreased breath sounds on left, no accessory muscle use GI: soft, bowel sounds active in all 4 quadrants, non-tender, slightly distended, left nephrostomy tube  Extremities: warm/dry, no edema  Skin: no rashes or lesions  Resolved Hospital Problem list     Assessment & Plan:  Left empyema -with Enterococcus likely seeding previously known loculated left effusion, likely malignant -CT ABD pelvis revealed pulmonary metastatic disease in the lung bases with  multiple pulmonary nodules. Enlarging left pleural effusion with pleural thickening, possibly metastatic and  moderate-sized right pleural effusion -IR preformed left thoracentesis 2/23 with 50 ml of light red/purulent/thick fluid removed  P:  Continue Zyvox Chest x-ray 2/26 independently reviewed shows larger pneumothorax component at the left costophrenic angle Hold off on lytics today and place chest tube to suction but feel that this may represent trapped lung due to malignant effusion. Await pleural fluid cytology   Best Practice (right click and "Reselect all SmartList Selections" daily)  Per primary   Labs   CBC: Recent Labs  Lab 02/09/22 0610 02/10/22 0812 02/11/22 0607 02/12/22 1039 02/13/22 0623  WBC 29.9* 38.6* 35.5* 31.2* 24.3*  NEUTROABS  --   --   --   --  20.2*  HGB 11.0* 10.7* 10.8* 10.1* 10.4*  HCT 34.1* 33.7* 35.2* 33.1* 33.7*  MCV 93.7 95.5 97.5 99.1 96.8  PLT 642* 771* 854* 866* 1,086*     Basic Metabolic Panel: Recent Labs  Lab 02/07/22 0517 02/08/22 0606 02/09/22 0610 02/10/22 0812 02/12/22 0551 02/13/22 0623  NA 140 139 140 143   142 141 142  K 3.4* 3.6 3.6 4.0   4.0 3.8 4.5  CL 106 105 107 112*   112* 114* 109  CO2 25 26 25 24   23 23 26   GLUCOSE 193* 162* 173* 160*   160* 177* 113*  BUN 45* 50* 50* 51*   50* 55* 52*  CREATININE 1.27* 1.39* 1.50* 1.23   1.20 1.19 1.24  CALCIUM 8.6* 8.3* 8.3* 8.3*   8.2* 8.2* 8.5*  MG 2.2  --  2.3 2.3 2.2  --   PHOS 3.6  --  4.0 3.6  --   --     GFR: Estimated Creatinine Clearance: 46.8 mL/min (by C-G formula based on SCr of 1.24 mg/dL). Recent Labs  Lab 02/10/22 0812 02/11/22 0607 02/12/22 0551 02/12/22 1039 02/13/22 0623  PROCALCITON 1.88 1.41 1.37  --   --   WBC 38.6* 35.5*  --  31.2* 24.3*     Liver Function Tests: Recent Labs  Lab 02/07/22 0517 02/08/22 0606 02/09/22 0610 02/10/22 0812 02/13/22 0623  AST 207* 142* 108* 102* 83*  ALT 122* 114* 94* 91* 65*  ALKPHOS 218* 245* 207* 204* 224*  BILITOT 0.6 0.6 0.8 0.7 0.3  PROT 5.7* 5.4* 5.4* 5.8* 5.7*  ALBUMIN 1.7* 1.6* 1.5* 1.6* <1.5*     No results for input(s): LIPASE, AMYLASE in the last 168 hours. No results for input(s): AMMONIA in the last 168 hours.  ABG    Component Value Date/Time   TCO2 28 12/10/2021 0036     Kara Mead MD. FCCP. [facility] Pager : [phone]  If no response to pager , please call [phone] until 7 pm After 7:00 pm call Elink  [phone]    02/13/2022, 11:44 AM

## 2022-02-13 NOTE — Procedures (Signed)
Pleural Fibrinolytic Administration Procedure Note  Kevin Villegas  [id number]  1941-04-06  Date:02/13/22  Time:10:44 AM   Provider Performing:Alize Borrayo V. Akesha Uresti   Procedure: Pleural Fibrinolysis Initial day (64158)  Indication(s) Fibrinolysis of complicated pleural effusion  Consent Risks of the procedure as well as the alternatives and risks of each were explained to the patient and/or caregiver.  Consent for the procedure was obtained.   Anesthesia None   Time Out Verified patient identification, verified procedure, site/side was marked, verified correct patient position, special equipment/implants available, medications/allergies/relevant history reviewed, required imaging and test results available.   Sterile Technique Hand hygiene, gloves   Procedure Description Existing pleural catheter was cleaned and accessed in sterile manner.  10mg  of tPA in 30cc of saline and 5mg  of dornase in 30cc of sterile water were injected into pleural space using existing pleural catheter.  Catheter will be clamped for 1 hour and then placed back to suction.   Complications/Tolerance None; patient tolerated the procedure well.   EBL None   Specimen(s) None  Kevin Villegas V. Kevin Soho MD

## 2022-02-13 NOTE — Progress Notes (Signed)
PROGRESS NOTE Kaydn Kumpf  SWN:[id number] DOB: [birth year] DOA: 01/28/2022 PCP: Dettinger, [name], MD   Brief Narrative/Hospital Course: 81 year old male with stage IV pancreatic cancer status post colectomy, hydronephrosis status post left nephrostomy, hypertension, SBO, CKD stage III who presented with abdominal pain, found to have small bowel obstruction status post diagnostic laparoscopy with lysis of adhesion on 2/40 measuring NG suction n.p.o. while waiting for return of bowel function.  Followed by surgery oncology.  Hospital course complicated by coffee-ground colored secretion from NG tube, leukocytosis. -Repeat CT scan 2/20 no bowel obstruction, no abscess moderate amount of free fluid in the right upper quadrant, body wall edema noted to the right anterior wall. With persistent leukocytosis but afebrile, being managed with TPN, IV antibiotics-.  Patient completed IV Zosyn 2/17-2/22.  Diet was slowly advanced NG tube removed at this time patient is tolerating diet having output in colostomy bag, having output in nephrostomy tube.  CT scan 2/21 no intra-abdominal abscess or obstruction but shows enlarging left pleural effusion.  Patient had persistent leukocytosis ID consulted, underwent left pleural diagnostic tap with 50 mL appears exudative fluid but Gram stain negative, total nucleated cells 25,000, neutrophils100 2/24:placed on Zyvox and underwent chest tube placement    Subjective:  Seen and examined this morning.  Patient reports of abdominal discomfort and pain appears weak frail on room air able to move his extremities.  Not in significant distress Over no fever. Bp stable. Wbc coming down, plt up 1086. On soft diet and TPN.  Has chest tube/nephrostomy tube in place   Assessment and Plan: * Partial small bowel obstruction ([place])- (present on admission) Stage IV pancreatic cancer with partial small bowel obstruction CT 2/25 with small abscess in the abdominal wall: S/p LOA,  small bowel resection, excisional biopsy of mesenteric mass Dr. [name] on 2/14.  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 CT scan 2/21 without intra-abdominal abscess or obstruction but enlarging left pleural effusion and new right pleural effusion Surgery on board-Off NG tube 2/21, on soft diet, Ensure-but not much eating.  Remains on TPN, continue pain control symptomatic management colostomy care Completed Zosyn ( 2/17>>2/22) for intra-abdominal process patient having drainage from surgical wound-CT abdomen 2/25-small abscess in the abdominal wall-Continue with antibiotics, surgery following. Continue to monitor his CBC and temperature curve and continue pain management, palliative on board   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)M1  with metastatic disease- (present on admission) See #1.Oncology on board.  Dr. Marin Olp following less likely will be candidate for systemic chemotherapy unless he is able to return home per hem-onc  Empyema of left pleural space (HCC) S/P thick purulent drainage- 50 ml by IR 2/23- grew Enterococcus, likely seeding previously known loculated left effusion likely malignant. S/p small bore chest tube placement 2/24 Dr Leandra Kern a candidate for surgery. ID following and started on Zyvox 2.24. Monitor respiratory status, WBC count now improving.   Leukocytosis Peaked to 38k-none downtrending on antibiotics and after chest tube placement.   Recent Labs  Lab 02/09/22 0610 02/10/22 0812 02/11/22 0607 02/12/22 1039 02/13/22 0623  WBC 29.9* 38.6* 35.5* 31.2* 24.3*    E-coli UTI Urine culture with pansensitive E. Coli and completed antibiotic  Hydronephrosis of left kidney- (present on admission) Due to tumor compression, status post left nephrostomy last changed on 1/30.  Discussed with urology previously- recommended renal ultrasound on 2/17 that showed chronic right hydronephrosis with atrophic right  kidney advise outpatient follow-up, seen on consult by  urology 2/18.  Nephrostomy draining.  Renal function is stable   Leg edema, left Asymmetric more on left side-check duplex of the leg  Goals of care, counseling/discussion Palliative care on board,appreciate input.Patient is DNR, plan is to follow-up palliative care at the facility.  Overall prognosis does not appear bright patient continues to be very weak deconditioned now with empyema needing chest tube placement palliative care consultation appreciated-continue ongoing discussion with patient's wife and family.   Stage 3a chronic kidney disease (CKD) ([place])- (present on admission) AKI with creatinine at 1.59 improved to 1.1-1.2 with ivf, tpn.Monitor labs Recent Labs  Lab 02/08/22 0606 02/09/22 0610 02/10/22 0812 02/12/22 0551 02/13/22 0623  BUN 50* 50* 51*   50* 55* 52*  CREATININE 1.39* 1.50* 1.23   1.20 1.19 1.24    Colostomy in place [facility]) Having output, cont routine colostomy care.  Essential hypertension- (present on admission) Controlled, not on meds.  Protein-calorie malnutrition, severe- (present on admission) Augment nutritional status as tolerated.  Dietitian on board.  On Soft diet/TPN and wean TPN as tolerated Nutrition Problem: Severe Malnutrition Etiology: chronic illness, cancer and cancer related treatments Signs/Symptoms: severe fat depletion, severe muscle depletion, percent weight loss Interventions: TPN    DVT prophylaxis: enoxaparin (LOVENOX) injection 40 mg Start: 02/02/22 1000 SCDs Start: 01/29/22 0013 Code Status:   Code Status: DNR Family Communication:plan of care discussed with patient/wife was updated previously.  Palliative care met with wife and daughter and updated 2/25.    Disposition:Currently not medically stable for discharge. Status WG:[id number] Remains Inpatient appropriate because: ongoing management of SBO/empyema   Objective: Vitals last 24 hrs: Vitals:   02/12/22 1312  02/12/22 2100 02/13/22 0456 02/13/22 0518  BP: 128/77 139/86 (!) 136/93   Pulse: 74 76 77   Resp: _0 Temp: 97.7 F (36.5 C) 98.3 F (36.8 C) (!) 97.5 F (36.4 C)   TempSrc: Oral Oral Oral   SpO2: 96% 93% 94%   Weight:    69.7 kg  Height:       Weight change: -2.4 kg  Physical Examination:  General exam: Alert awake ill looking frail thin deconditioned on room air  HEENT:Oral mucosa moist, Ear/Nose WNL grossly, dentition normal. Respiratory system: bilaterally clear breath sounds,no use of accessory muscle Cardiovascular system: S1 & S2 +, No JVD,. Gastrointestinal system: Abdomen soft, mild generalized tenderness present, area of redness on lower abdomen with dressing in place, left colostomy in place, PCN nephrostomy tube in place with urine output  Nervous System:Alert, awake, moving extremities and grossly nonfocal Extremities: Edema more on left lower extremity  Skin: No rashes,no icterus. MSK: Normal muscle bulk,tone, power  Medications reviewed:  Scheduled Meds:  Chlorhexidine Gluconate Cloth  6 each Topical Daily   enoxaparin (LOVENOX) injection  40 mg Subcutaneous Q24H   feeding supplement  1 Container Oral TID BM   feeding supplement  237 mL Oral BID BM   insulin aspart  0-15 Units Subcutaneous Q6H   lidocaine  1 patch Transdermal Q24H   lip balm  1 application Topical BID   methocarbamol  500 mg Oral QID   multivitamin with minerals  1 tablet Oral Daily   sodium chloride flush  10 mL Other Q8H   sodium chloride flush  10 mL Other Q8H   sodium chloride flush  3 mL Intravenous Q12H   Continuous Infusions:  sodium chloride Stopped (01/28/22 2301)   linezolid (ZYVOX) IV 600 mg (02/13/22 1018)   TPN ADULT (ION) 40 mL/hr  at 02/12/22 1708   TPN ADULT (ION)        Diet Order             DIET SOFT Room service appropriate? Yes; Fluid consistency: Thin  Diet effective now                    Nutrition Problem: Severe Malnutrition Etiology: chronic  illness, cancer and cancer related treatments Signs/Symptoms: severe fat depletion, severe muscle depletion, percent weight loss Interventions: TPN   Intake/Output Summary (Last 24 hours) at 02/13/2022 1055 Last data filed at 02/13/2022 1018 Gross per 24 hour  Intake 1027.96 ml  Output 2400 ml  Net -1372.04 ml   Net IO Since Admission: 8,224.33 mL [02/13/22 1055]  Wt Readings from Last 3 Encounters:  02/13/22 69.7 kg  01/07/22 63.5 kg  12/31/21 63.5 kg     Unresulted Labs (From admission, onward)     Start     Ordered   02/13/22 0500  CBC with Differential/Platelet  Daily,   R     Question:  Specimen collection method  Answer:  Lab=Lab collect   02/12/22 0649   02/13/22 0500  Prealbumin  Tomorrow morning,   R       Question:  Specimen collection method  Answer:  Lab=Lab collect   02/12/22 0649   02/13/22 0500  Comprehensive metabolic panel  Daily,   R     Question:  Specimen collection method  Answer:  Lab=Lab collect   02/12/22 0841   01/31/22 0500  Comprehensive metabolic panel  (TPN Lab Panel)  Every Mon,Thu (0500),   R     Question:  Specimen collection method  Answer:  Unit=Unit collect   01/30/22 0742   01/31/22 0500  Magnesium  (TPN Lab Panel)  Every Mon,Thu (0500),   R     Question:  Specimen collection method  Answer:  Unit=Unit collect   01/30/22 0742   01/31/22 0500  Phosphorus  (TPN Lab Panel)  Every Mon,Thu (0500),   R     Question:  Specimen collection method  Answer:  Unit=Unit collect   01/30/22 0742   01/31/22 0500  Triglycerides  (TPN Lab Panel)  Every Monday (0500),   R     Question:  Specimen collection method  Answer:  Unit=Unit collect   01/30/22 0742          Data Reviewed: I have personally reviewed following labs and imaging studies CBC: Recent Labs  Lab 02/09/22 0610 02/10/22 0812 02/11/22 0607 02/12/22 1039 02/13/22 0623  WBC 29.9* 38.6* 35.5* 31.2* 24.3*  NEUTROABS  --   --   --   --  20.2*  HGB 11.0* 10.7* 10.8* 10.1* 10.4*  HCT  34.1* 33.7* 35.2* 33.1* 33.7*  MCV 93.7 95.5 97.5 99.1 96.8  PLT 642* 771* 854* 866* 2,440*   Basic Metabolic Panel: Recent Labs  Lab 02/07/22 0517 02/08/22 0606 02/09/22 0610 02/10/22 0812 02/12/22 0551 02/13/22 0623  NA 140 139 140 143   142 141 142  K 3.4* 3.6 3.6 4.0   4.0 3.8 4.5  CL 106 105 107 112*   112* 114* 109  CO2 _0 GLUCOSE 193* 162* 173* 160*   160* 177* 113*  BUN 45* 50* 50* 51*   50* 55* 52*  CREATININE 1.27* 1.39* 1.50* 1.23   1.20 1.19 1.24  CALCIUM 8.6* 8.3* 8.3* 8.3*   8.2* 8.2* 8.5*  MG 2.2  --  2.3 2.3 2.2  --   PHOS 3.6  --  4.0 3.6  --   --    GFR: Estimated Creatinine Clearance: 46.8 mL/min (by C-G formula based on SCr of 1.24 mg/dL). Liver Function Tests: Recent Labs  Lab 02/07/22 0517 02/08/22 0606 02/09/22 0610 02/10/22 0812 02/13/22 0623  AST 207* 142* 108* 102* 83*  ALT 122* 114* 94* 91* 65*  ALKPHOS 218* 245* 207* 204* 224*  BILITOT 0.6 0.6 0.8 0.7 0.3  PROT 5.7* 5.4* 5.4* 5.8* 5.7*  ALBUMIN 1.7* 1.6* 1.5* 1.6* <1.5*   No results for input(s): LIPASE, AMYLASE in the last 168 hours. No results for input(s): AMMONIA in the last 168 hours. Coagulation Profile: No results for input(s): INR, PROTIME in the last 168 hours. Cardiac Enzymes: No results for input(s): CKTOTAL, CKMB, CKMBINDEX, TROPONINI in the last 168 hours. BNP (last 3 results) No results for input(s): PROBNP in the last 8760 hours. HbA1C: No results for input(s): HGBA1C in the last 72 hours. CBG: Recent Labs  Lab 02/11/22 2357 02/12/22 0642 02/12/22 1131 02/12/22 2325 02/13/22 0507  GLUCAP 141* 152* 145* 96 93   Lipid Profile: No results for input(s): CHOL, HDL, LDLCALC, TRIG, CHOLHDL, LDLDIRECT in the last 72 hours.  Thyroid Function Tests: No results for input(s): TSH, T4TOTAL, FREET4, T3FREE, THYROIDAB in the last 72 hours. Anemia Panel: No results for input(s): VITAMINB12, FOLATE, FERRITIN, TIBC, IRON, RETICCTPCT in the last 72  hours. Sepsis Labs: Recent Labs  Lab 02/10/22 7654 02/11/22 0607 02/12/22 0551  PROCALCITON 1.88 1.41 1.37     Recent Results (from the past 240 hour(s))  Culture, blood (routine x 2)     Status: None   Collection Time: 02/04/22  3:42 PM   Specimen: BLOOD  Result Value Ref Range Status   Specimen Description BLOOD LEFT ANTECUBITAL  Final   Special Requests   Final    BOTTLES DRAWN AEROBIC AND ANAEROBIC Blood Culture adequate volume   Culture   Final    NO GROWTH 5 DAYS Performed at [facility], [address] [place], [place] [address]    Report Status 02/09/2022 FINAL  Final  Culture, blood (routine x 2)     Status: None   Collection Time: 02/04/22  3:42 PM   Specimen: BLOOD  Result Value Ref Range Status   Specimen Description BLOOD RIGHT ANTECUBITAL  Final   Special Requests   Final    BOTTLES DRAWN AEROBIC ONLY Blood Culture results may not be optimal due to an inadequate volume of blood received in culture bottles   Culture   Final    NO GROWTH 5 DAYS Performed at [facility], [place] [address] [place], [place] [address]    Report Status 02/09/2022 FINAL  Final  Body fluid culture w Gram Stain     Status: None (Preliminary result)   Collection Time: 02/10/22  1:37 PM   Specimen: PATH Cytology Pleural fluid  Result Value Ref Range Status   Specimen Description   Final    PLEURAL Performed at [place] [address], [place], [place] [address]    Special Requests   Final    NONE Performed at [facility], [place] [address] [place], [place] [address]    Gram Stain FEW WBC SEEN NO ORGANISMS SEEN   Final   Culture   Final    MODERATE ENTEROCOCCUS FAECIUM VANCOMYCIN RESISTANT ENTEROCOCCUS CRITICAL RESULT CALLED TO, READ BACK BY AND VERIFIED WITH:  RN LISA.P AT 1144 ON 02/11/2022 BY T.SAAD. Performed at [facility], [place] [address], [place], [place] [address]    Report Status PENDING  Incomplete    Organism ID, Bacteria ENTEROCOCCUS FAECIUM  Final      Susceptibility   Enterococcus faecium - MIC*    AMPICILLIN >=32 RESISTANT Resistant     VANCOMYCIN >=32 RESISTANT Resistant     GENTAMICIN SYNERGY SENSITIVE Sensitive     LINEZOLID 2 SENSITIVE Sensitive     * MODERATE ENTEROCOCCUS FAECIUM    Antimicrobials: Anti-infectives (From admission, onward)    Start     Dose/Rate Route Frequency Ordered Stop   02/11/22 1330  linezolid (ZYVOX) IVPB 600 mg        600 mg 300 mL/hr over 60 Minutes Intravenous Every 12 hours 02/11/22 1234     02/04/22 1545  piperacillin-tazobactam (ZOSYN) IVPB 3.375 g        3.375 g 12.5 mL/hr over 240 Minutes Intravenous Every 8 hours 02/04/22 1453 02/09/22 1359   02/01/22 2200  ceFAZolin (ANCEF) IVPB 1 g/50 mL premix  Status:  Discontinued        1 g 100 mL/hr over 30 Minutes Intravenous Every 8 hours 02/01/22 1504 02/04/22 1453   02/01/22 0930  cefoTEtan (CEFOTAN) 2 g in sodium chloride 0.9 % 100 mL IVPB        2 g 200 mL/hr over 30 Minutes Intravenous On call to O.R. 01/31/22 1908 02/01/22 1533   01/30/22 1000  cefTRIAXone (ROCEPHIN) 1 g in sodium chloride 0.9 % 100 mL IVPB  Status:  Discontinued        1 g 200 mL/hr over 30 Minutes Intravenous Every 24 hours 01/29/22 0013 02/01/22 1504   01/28/22 1830  cefTRIAXone (ROCEPHIN) 1 g in sodium chloride 0.9 % 100 mL IVPB        1 g 200 mL/hr over 30 Minutes Intravenous  Once 01/28/22 1828 01/28/22 1927      Culture/Microbiology    Component Value Date/Time   SDES  02/10/2022 1337    PLEURAL Performed at [facility], [place] [address], [place], [place] [address]    SPECREQUEST  02/10/2022 1337    NONE Performed at [facility], [place] [address], [place], [place] [address]    CULT  02/10/2022 1337    MODERATE ENTEROCOCCUS FAECIUM VANCOMYCIN RESISTANT ENTEROCOCCUS CRITICAL RESULT CALLED TO, READ BACK BY AND VERIFIED WITH: RN LISA.P AT 1144 ON 02/11/2022 BY  T.SAAD. Performed at [facility], [place] [address], [place], [place] [address]    REPTSTATUS PENDING 02/10/2022 1337  Radiology Studies: CT ABDOMEN PELVIS WO CONTRAST  Result Date: 02/12/2022 CLINICAL DATA:  Abdominal pain, pancreatic carcinoma EXAM: CT ABDOMEN AND PELVIS WITHOUT CONTRAST TECHNIQUE: Multidetector CT imaging of the abdomen and pelvis was performed following the standard protocol without IV contrast. RADIATION DOSE REDUCTION: This exam was performed according to the departmental dose-optimization program which includes automated exposure control, adjustment of the mA and/or kV according to patient size and/or use of iterative reconstruction technique. COMPARISON:  02/07/2022 FINDINGS: Lower chest: There is interval placement of left chest tube with a decrease in the amount of left pleural effusion. There is residual moderate bilateral pleural effusions. Atelectatic changes are noted in the lower lung fields, more so on the left side. There are multiple noncalcified nodules of varying sizes in both lower lung fields suggesting pulmonary metastatic disease. Hepatobiliary: There are few low-density lesions in the liver largest measuring  2.3 cm in the right lobe. There is no significant interval change. Density measurements suggest possible cysts. Surgical clips are seen in gallbladder fossa. Pancreas: No focal abnormality is seen. Spleen: Not seen Adrenals/Urinary Tract: There is mild nodularity in the adrenals. There is marked right hydronephrosis. Right ureter is dilated. There is percutaneous left nephrostomy tube ureteral stent. There is no hydronephrosis in the left kidney. There is 3.1 cm cyst in the upper pole of right kidney. Right kidney is smaller than left with cortical atrophy. Stomach/Bowel: Stomach is not distended. There is stent in the distal duodenum. There is dilation of small-bowel loops measuring up to 5.5 cm in maximum diameter. Appendix is not distinctly seen. Scattered  diverticula are seen in colon without signs of focal diverticulitis. There is liquid stool in the rectum. Colostomy is seen in left paramedian location slightly above the level of umbilicus. Vascular/Lymphatic: Scattered arterial calcifications are seen. Reproductive: Unremarkable. Other: There is diffuse edema in subcutaneous plane suggesting anasarca. There is 2.7 cm soft tissue nodule in the right anterior abdominal wall with no significant change. There is no pneumoperitoneum. Small to moderate ascites is present. Musculoskeletal: Degenerative changes are noted in the lumbar spine with spinal stenosis and encroachment of neural foramina at multiple levels. IMPRESSION: There is decrease in left pleural effusion after placement of left chest tube. Moderate bilateral pleural effusions are present. There are multiple noncalcified nodules of varying sizes in the lower lung fields consistent with pulmonary metastatic disease. There is dilation of small-bowel loops with interval worsening suggesting ileus or partial obstruction. Diverticulosis of colon without signs of diverticulitis. Severe chronic dilation of collecting system and ureter on the right side with no significant change. There is percutaneous nephrostomy and ureteral stent on the left side. Small to moderate ascites. Other findings as described in the body of the report. Electronically Signed   By: Elmer Picker M.D.   On: 02/12/2022 14:01   DG CHEST PORT 1 VIEW  Result Date: 02/11/2022 CLINICAL DATA:  Left thoracentesis EXAM: PORTABLE CHEST 1 VIEW COMPARISON:  Previous studies including the examination of 02/10/2022 FINDINGS: There is interval placement of pigtail left chest tube with its tip in the lateral aspect of left lower lung fields. There is interval decrease in amount of left pleural effusion. Still, there is moderate residual effusion on the left side. Evaluation of left lower lung fields for infiltrates is limited by the effusion.  There are patchy densities in the right lower lung fields with interval improvement. There are scattered nodular densities in the visualized portions of lung fields suggesting pulmonary metastatic disease. There is no pneumothorax. IMPRESSION: There is interval decrease in left pleural effusion after placement of left chest tube. Still, there is moderate residual left pleural effusion. There are scattered noncalcified nodules in both lungs suggesting pulmonary metastatic disease. Electronically Signed   By: Elmer Picker M.D.   On: 02/11/2022 20:06     LOS: 15 days   Antonieta Pert, MD [facility]  02/13/2022, 10:55 AM

## 2022-02-13 NOTE — Assessment & Plan Note (Deleted)
Asymmetric more on left side-pending duplex of the left leg

## 2022-02-13 NOTE — Progress Notes (Signed)
12 Days Post-Op   Subjective/Chief Complaint: Complains of lower abd pain   Objective: Vital signs in last 24 hours: Temp:  [97.5 F (36.4 C)-98.3 F (36.8 C)] 97.5 F (36.4 C) (02/26 0456) Pulse Rate:  [74-77] 77 (02/26 0456) Resp:  [16-18] 16 (02/26 0456) BP: (128-139)/(77-93) 136/93 (02/26 0456) SpO2:  [93 %-96 %] 94 % (02/26 0456) Weight:  [69.7 kg] 69.7 kg (02/26 0518) Last BM Date : 02/12/22  Intake/Output from previous day: 02/25 0701 - 02/26 0700 In: 1028 [I.V.:391; IV Piggyback:637] Out: 2300 [Urine:1650; Chest Tube:650] Intake/Output this shift: Total I/O In: -  Out: 300 [Urine:250; Stool:50]  General appearance: alert and cooperative Resp: clear to auscultation bilaterally Cardio: regular rate and rhythm GI: soft, tender over suprapubic port site that is draining pus. Cellulitis slightly worse. Packed with gauze  Lab Results:  Recent Labs    02/12/22 1039 02/13/22 0623  WBC 31.2* 24.3*  HGB 10.1* 10.4*  HCT 33.1* 33.7*  PLT 866* 1,086*   BMET Recent Labs    02/12/22 0551 02/13/22 0623  NA 141 142  K 3.8 4.5  CL 114* 109  CO2 23 26  GLUCOSE 177* 113*  BUN 55* 52*  CREATININE 1.19 1.24  CALCIUM 8.2* 8.5*   PT/INR No results for input(s): LABPROT, INR in the last 72 hours. ABG No results for input(s): PHART, HCO3 in the last 72 hours.  Invalid input(s): PCO2, PO2  Studies/Results: CT ABDOMEN PELVIS WO CONTRAST  Result Date: 02/12/2022 CLINICAL DATA:  Abdominal pain, pancreatic carcinoma EXAM: CT ABDOMEN AND PELVIS WITHOUT CONTRAST TECHNIQUE: Multidetector CT imaging of the abdomen and pelvis was performed following the standard protocol without IV contrast. RADIATION DOSE REDUCTION: This exam was performed according to the departmental dose-optimization program which includes automated exposure control, adjustment of the mA and/or kV according to patient size and/or use of iterative reconstruction technique. COMPARISON:  02/07/2022 FINDINGS:  Lower chest: There is interval placement of left chest tube with a decrease in the amount of left pleural effusion. There is residual moderate bilateral pleural effusions. Atelectatic changes are noted in the lower lung fields, more so on the left side. There are multiple noncalcified nodules of varying sizes in both lower lung fields suggesting pulmonary metastatic disease. Hepatobiliary: There are few low-density lesions in the liver largest measuring 2.3 cm in the right lobe. There is no significant interval change. Density measurements suggest possible cysts. Surgical clips are seen in gallbladder fossa. Pancreas: No focal abnormality is seen. Spleen: Not seen Adrenals/Urinary Tract: There is mild nodularity in the adrenals. There is marked right hydronephrosis. Right ureter is dilated. There is percutaneous left nephrostomy tube ureteral stent. There is no hydronephrosis in the left kidney. There is 3.1 cm cyst in the upper pole of right kidney. Right kidney is smaller than left with cortical atrophy. Stomach/Bowel: Stomach is not distended. There is stent in the distal duodenum. There is dilation of small-bowel loops measuring up to 5.5 cm in maximum diameter. Appendix is not distinctly seen. Scattered diverticula are seen in colon without signs of focal diverticulitis. There is liquid stool in the rectum. Colostomy is seen in left paramedian location slightly above the level of umbilicus. Vascular/Lymphatic: Scattered arterial calcifications are seen. Reproductive: Unremarkable. Other: There is diffuse edema in subcutaneous plane suggesting anasarca. There is 2.7 cm soft tissue nodule in the right anterior abdominal wall with no significant change. There is no pneumoperitoneum. Small to moderate ascites is present. Musculoskeletal: Degenerative changes are noted in the lumbar  spine with spinal stenosis and encroachment of neural foramina at multiple levels. IMPRESSION: There is decrease in left pleural  effusion after placement of left chest tube. Moderate bilateral pleural effusions are present. There are multiple noncalcified nodules of varying sizes in the lower lung fields consistent with pulmonary metastatic disease. There is dilation of small-bowel loops with interval worsening suggesting ileus or partial obstruction. Diverticulosis of colon without signs of diverticulitis. Severe chronic dilation of collecting system and ureter on the right side with no significant change. There is percutaneous nephrostomy and ureteral stent on the left side. Small to moderate ascites. Other findings as described in the body of the report. Electronically Signed   By: Elmer Picker M.D.   On: 02/12/2022 14:01   DG CHEST PORT 1 VIEW  Result Date: 02/11/2022 CLINICAL DATA:  Left thoracentesis EXAM: PORTABLE CHEST 1 VIEW COMPARISON:  Previous studies including the examination of 02/10/2022 FINDINGS: There is interval placement of pigtail left chest tube with its tip in the lateral aspect of left lower lung fields. There is interval decrease in amount of left pleural effusion. Still, there is moderate residual effusion on the left side. Evaluation of left lower lung fields for infiltrates is limited by the effusion. There are patchy densities in the right lower lung fields with interval improvement. There are scattered nodular densities in the visualized portions of lung fields suggesting pulmonary metastatic disease. There is no pneumothorax. IMPRESSION: There is interval decrease in left pleural effusion after placement of left chest tube. Still, there is moderate residual left pleural effusion. There are scattered noncalcified nodules in both lungs suggesting pulmonary metastatic disease. Electronically Signed   By: Elmer Picker M.D.   On: 02/11/2022 20:06    Anti-infectives: Anti-infectives (From admission, onward)    Start     Dose/Rate Route Frequency Ordered Stop   02/11/22 1330  linezolid (ZYVOX)  IVPB 600 mg        600 mg 300 mL/hr over 60 Minutes Intravenous Every 12 hours 02/11/22 1234     02/04/22 1545  piperacillin-tazobactam (ZOSYN) IVPB 3.375 g        3.375 g 12.5 mL/hr over 240 Minutes Intravenous Every 8 hours 02/04/22 1453 02/09/22 1359   02/01/22 2200  ceFAZolin (ANCEF) IVPB 1 g/50 mL premix  Status:  Discontinued        1 g 100 mL/hr over 30 Minutes Intravenous Every 8 hours 02/01/22 1504 02/04/22 1453   02/01/22 0930  cefoTEtan (CEFOTAN) 2 g in sodium chloride 0.9 % 100 mL IVPB        2 g 200 mL/hr over 30 Minutes Intravenous On call to O.R. 01/31/22 1908 02/01/22 1533   01/30/22 1000  cefTRIAXone (ROCEPHIN) 1 g in sodium chloride 0.9 % 100 mL IVPB  Status:  Discontinued        1 g 200 mL/hr over 30 Minutes Intravenous Every 24 hours 01/29/22 0013 02/01/22 1504   01/28/22 1830  cefTRIAXone (ROCEPHIN) 1 g in sodium chloride 0.9 % 100 mL IVPB        1 g 200 mL/hr over 30 Minutes Intravenous  Once 01/28/22 1828 01/28/22 1927       Assessment/Plan: s/p Procedure(s): LAPAROSCOPIC LYSIS OF ADHESIONS (N/A) SMALL BOWEL RESECTION (N/A) Advance diet Continue IV abx CT showed small abscess in abd wall Stage IV pancreatic cancer with psbo  - POD11 s/p Lap LOA, Small bowel resection, excisional biopsy of mesentery mass by Dr. 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 Likely from abd wall abscess - CT scan 2/21 without intra abdominal abscess or obstruction. Some free fluid in the pelvis and RUQ postoperatively. Enlarging left pleural effusion and new right pleural effusion.  - Having colostomy output. Has not tried soft foods yet. Low appetite but says he will try today - continue TPN, consulted RD for calorie count and consider weaning TPN. CC start 2/23 - PT/OT -  recc snf with possible progression to [facility]   FEN: SOFT, ensure. CC. TPN ID: cefotetan periop, ancef (UTI), zosyn 2/17>2/21 VTE: lovenox   E coli UTI CKD 3A HTN  LOS: 15 days    Autumn Messing III 02/13/2022

## 2022-02-13 NOTE — Progress Notes (Signed)
Daily Progress Note   Patient Name: Kevin Villegas       Date: 02/13/2022 DOB: [birth year]  Age: 81 y.o. MRN#: [id number] Attending Physician: Antonieta Pert, MD Primary Care Physician: Dettinger, [name], MD Admit Date: 01/28/2022  Reason for Consultation/Follow-up: Establishing goals of care  Subjective: I saw and examined Kevin Villegas today.  He was lying in bed in no distress.  Reports that he continues to feel current regimen is sufficient to control his pain.  We discussed oral medication being longer acting and this may be why he thinks it is more effective.  States he will use this preferentially.  Reports pain has been controlled overall other than when surgeon probed the area of infection.  Discussed continued concern about his long-term ability to improve his nutrition and functional status.  States he is, " going to keep trying."  Length of Stay: 15  Current Medications: Scheduled Meds:   Chlorhexidine Gluconate Cloth  6 each Topical Daily   enoxaparin (LOVENOX) injection  40 mg Subcutaneous Q24H   feeding supplement  1 Container Oral TID BM   feeding supplement  237 mL Oral BID BM   insulin aspart  0-15 Units Subcutaneous Q6H   lidocaine  1 patch Transdermal Q24H   lip balm  1 application Topical BID   methocarbamol  500 mg Oral QID   multivitamin with minerals  1 tablet Oral Daily   sodium chloride flush  10 mL Other Q8H   sodium chloride flush  10 mL Other Q8H   sodium chloride flush  3 mL Intravenous Q12H    Continuous Infusions:  sodium chloride Stopped (01/28/22 2301)   linezolid (ZYVOX) IV Stopped (02/13/22 1242)   TPN ADULT (ION) 85 mL/hr at 02/13/22 1739    PRN Meds: sodium chloride, acetaminophen, alum & mag hydroxide-simeth, antiseptic oral rinse,  diphenhydrAMINE **OR** diphenhydrAMINE, fentaNYL (SUBLIMAZE) injection, iohexol, menthol-cetylpyridinium, ondansetron (ZOFRAN) IV, oxyCODONE, phenol, prochlorperazine, simethicone, sodium chloride flush  Physical Exam         Appears chronically il But hemodynamically stable Resting in bed NG tube out No edema Awake alert S 1 S 2  ABD pad in place over abdominal wound.    Vital Signs: BP 127/81 (BP Location: Right Arm)    Pulse 65    Temp 98 F (36.7 C) (Oral)  Resp 20    Ht 5\' 11"  (1.803 m)    Wt 69.7 kg    SpO2 96%    BMI 21.43 kg/m  SpO2: SpO2: 96 % O2 Device: O2 Device: Room Air O2 Flow Rate: O2 Flow Rate (L/min): 2 L/min  Intake/output summary:  Intake/Output Summary (Last 24 hours) at 02/13/2022 1909 Last data filed at 02/13/2022 1739 Gross per 24 hour  Intake 2099.45 ml  Output 2230 ml  Net -130.55 ml    LBM: Last BM Date : 02/12/22 Baseline Weight: Weight: 57 kg Most recent weight: Weight: 69.7 kg       Palliative Assessment/Data:      Patient Active Problem List   Diagnosis Date Noted   Leg edema, left 02/13/2022   Empyema lung ([place])    Goals of care, counseling/discussion 02/10/2022   Empyema of left pleural space ([place]) 02/10/2022   E-coli UTI 02/01/2022   SBO (small bowel obstruction) ([place]) 01/28/2022   Subcutaneous nodule of abdominal wall 01/09/2022   Partial small bowel obstruction ([place]) 01/07/2022   COVID-19 virus infection 01/07/2022   Colostomy in place ([place]) 11/10/2021   Stage 3a chronic kidney disease (CKD) ([place]) 11/10/2021   Pyelonephritis    Essential hypertension 10/30/2021   Hydronephrosis of left kidney 10/28/2021   Ureteral stricture, left 10/27/2021   Protein-calorie malnutrition, severe 06/12/2021   Hydroureter on right 06/10/2021   Aortic atherosclerosis ([place]) 06/10/2021   Leukocytosis 06/02/2021   Right ureteral stone 06/02/2021   Colon stricture ([place])    Pancreatic mass 06/27/2019   Malignant neoplasm of tail of pancreas  [id number])M1  with metastatic disease 06/27/2019   Rhinitis medicamentosa 02/26/2019    Palliative Care Assessment & Plan   Patient Profile:    Assessment:  81 year old gentleman who lives at home with his wife in [place], [place].  His daughter lives nearby.  He has a past medical history of stage IV pancreatic cancer, status post colectomy, hydronephrosis status post nephrostomy, underlying hypertension small bowel obstruction stage II chronic kidney disease.  Patient presented with ongoing abdominal pain, known to have concerns for small bowel obstruction issues.  Underwent a diagnostic laparoscopy with lysis of additions on 2-14.  He is being followed by general surgery and medical oncology.  Has been placed on TPN for nutritional support.  Has been placed on NG tube for suction.  So far, patient has not not had any bowel movements or passed flatus. Palliative medicine team has been consulted for CODE STATUS and goals of care discussions and for additional support.  Recommendations/Plan: DNR Continue current mode of care.  He is invested in plan to continue with current interventions.  He is still hopeful that he will recover enough to receive systemic therapy for his pancreatic cancer.  He places a great deal of confidence in Dr. Antonieta Pert recommendations and appreciate his input greatly. Pain: Continue same.  He has been using intermittent IV fentanyl and we talked about trying the oxycodone if he is not having an acute pain crisis as this would likely last longer than IV fentanyl.  Code Status:    Code Status Orders  (From admission, onward)           Start     Ordered   02/06/22 1335  Do not attempt resuscitation (DNR)  Continuous       Question Answer Comment  In the event of cardiac or respiratory ARREST Do not call a code blue   In the event of cardiac or respiratory  ARREST Do not perform Intubation, CPR, defibrillation or ACLS   In the event of cardiac or  respiratory ARREST Use medication by any route, position, wound care, and other measures to relive pain and suffering. May use oxygen, suction and manual treatment of airway obstruction as needed for comfort.      02/06/22 1334           Code Status History     Date Active Date Inactive Code Status Order ID Comments User Context   01/29/2022 0014 02/06/2022 1334 Full Code [id number]  Marcelyn Bruins, MD Inpatient   01/07/2022 2129 01/10/2022 2258 Full Code [id number]  Rise Patience, MD Inpatient   12/10/2021 1331 12/11/2021 2137 Full Code [id number]  Jonnie Finner, DO ED   11/10/2021 0354 11/13/2021 2039 Full Code [id number]  Chotiner, Yevonne Aline, MD Inpatient   10/27/2021 2317 10/30/2021 1833 Full Code [id number]  Lenore Cordia, MD Inpatient   06/09/2021 2201 06/18/2021 1923 Full Code [id number]  Lenore Cordia, MD Inpatient   06/01/2021 2102 06/05/2021 1830 Full Code [id number]  Rhetta Mura, DO ED   05/18/2021 1200 05/26/2021 1958 Partial Code [id number]  Jonnie Finner, DO Inpatient   12/12/2014 1730 12/17/2014 1704 Full Code [id number]  Velvet Bathe, MD Inpatient       Prognosis:  Guarded   Discharge Planning: To Be Determined  Care plan was discussed with patient.  Thank you for allowing the Palliative Medicine Team to assist in the care of this patient. Micheline Rough, MD  Please contact Palliative Medicine Team phone at [phone] for questions and concerns.

## 2022-02-13 NOTE — Progress Notes (Signed)
Patient transferred to 1445 for higher level of care to monitor chest tube on suction.. Patient stable at transfer.

## 2022-02-13 NOTE — Progress Notes (Signed)
Daily Progress Note   Patient Name: Kevin Villegas       Date: 02/13/2022 DOB: 04/30/1941  Age: 81 y.o. MRN#: [id number] Attending Physician: Antonieta Pert, MD Primary Care Physician: Dettinger, [name], MD Admit Date: 01/28/2022  Reason for Consultation/Follow-up: Establishing goals of care  Subjective: I met today with Kevin Villegas in conjunction with his wife and daughter.  He was awake for most of encounter but did drift off in a couple of occasions.  He has been in and out as far as confusion but did appear to be able to follow most of conversation and did interject appropriate comments at several points during our conversation.  We discussed his current clinical situation and the fact that he has multiple comorbidities going on, some of which have potential to improve, and some of which are not going to go away regardless of interventions moving forward.  Discussed underlying cancer and that this is primary driving factor for his other comorbidities and is not a curable illness.  We also discussed current problems of poor appetite and anorexia, recent bowel surgery with question of local wound infection at site of incision, and loculated effusion for which she has had a chest tube placed and has been getting lytic therapy.  Discussed my concern that he has not made significant gains in his progress and his wife attributes this to the fact that antibiotics had been stopped as thought was that his leukocytosis was quite possibly due to underlying cancer rather than being related to infectious process.  Discussed that decisions are made based upon best current clinical knowledge at any point in time and antibiotics were stopped the recommendation of infectious disease specialist and this sort of  infection is not 1 that would resolve without having drainage performed as well.  She feels he needs more time with current therapy to determine how he is going to do prior to any consideration for care plan other than aggressive care.  He then spoke up and said that he remains hopeful to pursue chemotherapy but understands this may not be realistic possibility.  He places a high amount of confidence in Kevin Villegas and tells me that he is relying on his input and recommendations for care plan.  We discussed plan to continue with current interventions through the weekend and reassess his  situation again on Monday.  I indicated that I am hopeful we seen improvement in nutrition as well as progress and treatment of loculated effusion.  Discussed that if we are not seeing continued clinical improvement, the reality of the situation is likely that he is not going to reach a point of being a candidate for systemic therapy and we should reevaluate what our plan looks like for long-term goals moving forward.   Length of Stay: 15  Current Medications: Scheduled Meds:   Chlorhexidine Gluconate Cloth  6 each Topical Daily   enoxaparin (LOVENOX) injection  40 mg Subcutaneous Q24H   feeding supplement  1 Container Oral TID BM   feeding supplement  237 mL Oral BID BM   insulin aspart  0-15 Units Subcutaneous Q6H   lidocaine  1 patch Transdermal Q24H   lip balm  1 application Topical BID   methocarbamol  500 mg Oral QID   multivitamin with minerals  1 tablet Oral Daily   sodium chloride flush  10 mL Other Q8H   sodium chloride flush  10 mL Other Q8H   sodium chloride flush  3 mL Intravenous Q12H    Continuous Infusions:  sodium chloride Stopped (01/28/22 2301)   linezolid (ZYVOX) IV 600 mg (02/12/22 2046)   TPN ADULT (ION) 40 mL/hr at 02/12/22 1708    PRN Meds: sodium chloride, acetaminophen, alum & mag hydroxide-simeth, antiseptic oral rinse, diphenhydrAMINE **OR** diphenhydrAMINE, fentaNYL  (SUBLIMAZE) injection, iohexol, menthol-cetylpyridinium, ondansetron (ZOFRAN) IV, oxyCODONE, phenol, prochlorperazine, simethicone, sodium chloride flush  Physical Exam         Appears chronically il But hemodynamically stable Resting in bed NG tube out No edema Awake alert S 1 S 2  ABD pad in place over abdominal wound.  Bloody discharge noted on bedding but does not appear to be actively bleeding  Vital Signs: BP (!) 136/93 (BP Location: Right Arm)    Pulse 77    Temp (!) 97.5 F (36.4 C) (Oral)    Resp 16    Ht 5' 11"  (1.803 m)    Wt 69.7 kg    SpO2 94%    BMI 21.43 kg/m  SpO2: SpO2: 94 % O2 Device: O2 Device: Room Air O2 Flow Rate: O2 Flow Rate (L/min): 2 L/min  Intake/output summary:  Intake/Output Summary (Last 24 hours) at 02/13/2022 0847 Last data filed at 02/13/2022 1761 Gross per 24 hour  Intake 1027.96 ml  Output 2400 ml  Net -1372.04 ml    LBM: Last BM Date : 02/12/22 Baseline Weight: Weight: 57 kg Most recent weight: Weight: 69.7 kg       Palliative Assessment/Data:      Patient Active Problem List   Diagnosis Date Noted   Empyema lung ([place])    Goals of care, counseling/discussion 02/10/2022   Empyema of left pleural space ([place]) 02/10/2022   E-coli UTI 02/01/2022   SBO (small bowel obstruction) ([place]) 01/28/2022   Subcutaneous nodule of abdominal wall 01/09/2022   Partial small bowel obstruction ([place]) 01/07/2022   COVID-19 virus infection 01/07/2022   Colostomy in place [facility]) 11/10/2021   Stage 3a chronic kidney disease (CKD) ([place]) 11/10/2021   Pyelonephritis    Essential hypertension 10/30/2021   Hydronephrosis of left kidney 10/28/2021   Ureteral stricture, left 10/27/2021   Protein-calorie malnutrition, severe 06/12/2021   Hydroureter on right 06/10/2021   Aortic atherosclerosis ([place]) 06/10/2021   Leukocytosis 06/02/2021   Right ureteral stone 06/02/2021   Colon stricture ([facility])    Pancreatic mass  06/27/2019   Malignant neoplasm of tail of  pancreas [id number]  with metastatic disease 06/27/2019   Rhinitis medicamentosa 02/26/2019    Palliative Care Assessment & Plan   Patient Profile:    Assessment:  81 year old gentleman who lives at home with his wife in [place], [place].  His daughter lives nearby.  He has a past medical history of stage IV pancreatic cancer, status post colectomy, hydronephrosis status post nephrostomy, underlying hypertension small bowel obstruction stage II chronic kidney disease.  Patient presented with ongoing abdominal pain, known to have concerns for small bowel obstruction issues.  Underwent a diagnostic laparoscopy with lysis of additions on 2-14.  He is being followed by general surgery and medical oncology.  Has been placed on TPN for nutritional support.  Has been placed on NG tube for suction.  So far, patient has not not had any bowel movements or passed flatus. Palliative medicine team has been consulted for CODE STATUS and goals of care discussions and for additional support.  Recommendations/Plan: DNR Continue current mode of care.  He is invested in plan to continue with current interventions.  He is still hopeful that he will recover enough to receive systemic therapy for his pancreatic cancer.  He places a great deal of confidence in Dr. Antonieta Pert recommendations and appreciate his input greatly. Pain: He feels today that current regimen has been sufficient and requests to keep with it.  Family reports he has had confusion problems with other opioids in the past and this regimen seems to cause less confusion.  We will continue with fentanyl and oxycodone as needed.  Code Status:    Code Status Orders  (From admission, onward)           Start     Ordered   02/06/22 1335  Do not attempt resuscitation (DNR)  Continuous       Question Answer Comment  In the event of cardiac or respiratory ARREST Do not call a code blue   In the event of cardiac or respiratory ARREST Do not  perform Intubation, CPR, defibrillation or ACLS   In the event of cardiac or respiratory ARREST Use medication by any route, position, wound care, and other measures to relive pain and suffering. May use oxygen, suction and manual treatment of airway obstruction as needed for comfort.      02/06/22 1334           Code Status History     Date Active Date Inactive Code Status Order ID Comments User Context   01/29/2022 0014 02/06/2022 1334 Full Code [id number]  Marcelyn Bruins, MD Inpatient   01/07/2022 2129 01/10/2022 2258 Full Code [id number]  Rise Patience, MD Inpatient   12/10/2021 1331 12/11/2021 2137 Full Code [id number]  Jonnie Finner, DO ED   11/10/2021 0354 11/13/2021 2039 Full Code [id number]  Chotiner, Yevonne Aline, MD Inpatient   10/27/2021 2317 10/30/2021 1833 Full Code [id number]  Lenore Cordia, MD Inpatient   06/09/2021 2201 06/18/2021 1923 Full Code [id number]  Lenore Cordia, MD Inpatient   06/01/2021 2102 06/05/2021 1830 Full Code [id number]  Rhetta Mura, DO ED   05/18/2021 1200 05/26/2021 1958 Partial Code [id number]  Jonnie Finner, DO Inpatient   12/12/2014 1730 12/17/2014 1704 Full Code [id number]  Velvet Bathe, MD Inpatient       Prognosis:  Guarded   Discharge Planning: To Be Determined  Care plan was discussed with patient.  Total time: 55 minutes  Thank you for  allowing the Palliative Medicine Team to assist in the care of this patient. Micheline Rough, MD  Please contact Palliative Medicine Team phone at [phone] for questions and concerns.

## 2022-02-13 NOTE — Progress Notes (Addendum)
PHARMACY - TOTAL PARENTERAL NUTRITION CONSULT NOTE   Indication: Small bowel obstruction and inability to meet nutritional needs w/ PO intake  Patient Measurements: Height: 5' 11"  (180.3 cm) Weight: 69.7 kg (153 lb 10.6 oz) IBW/kg (Calculated) : 75.3 TPN AdjBW (KG): 57.7 Body mass index is 21.43 kg/m. Usual Weight: Weight noted in November 2022 to be 63.5 kg  Assessment: 81 yo male presenting to [facility] ED for abdominal pain, nausea, vomiting. PMH includes pancreatic cancer with evidence of metastatic disease to his bladder, lungs, and pleural space and history of SBO.  S/p stent placement between stomach and intestines and colostomy placement in June 2022. Pharmacy is consulted to begin TPN for possible bridge to surgery given low pre-albumin and inability to meet nutritional needs w/ PO intake.   Glucose / Insulin: No hx DM; CBGs lower but WNL on moderate SSI q6h after reducing TPN by half yesterday - 4 units SSI used yesterday Electrolytes: Ca slightly elevated; all others stable WNL Renal: SCr slightly up today, had been trending down; BUN elevated but stable ~50s; UOP remains adequate Hepatic: LFTs slightly elevated but trending down (except Alk phos back up slightly since 2/23); Tbili stable WNL; albumin low/stable - TG stable WNL Intake / Output; MIVF: net +6L this admission (assuming full charting); not on diuretics - no IVF - no stool charted from ostomy, although Surgery note mentions output - 2/24 started 48-hr calorie count - Per RN, ate nothing yesterday except 1 diet coke GI Imaging: - 2/10 CT abdomen pelvis: re-demonstration of high-grade partial SBO with transition point in the upper left pelvis psych likely due to adhesion.  - 2/20 abd CT: No evidence of postoperative intra-abdominal abscess. BL pleural effusions. Pulmonary metastatic disease in the lung bases with multiple pulmonary nodules. - 2/25 CT: worsened SB loop dilation suggesting ileus or pSBO GI Surgeries /  Procedures:  - 2/14: laparoscopic lysis of adhesions, small bowel resection, excisional bipsy of mesentery mass   Central access: PAC TPN start date: 2/12  Nutritional Goals: Goal TPN rate is 85 mL/hr (provides 112g of protein and 2061 kcals per day)  RD Assessment:  Estimated Needs Total Energy Estimated Needs: 2050-2250 kcal Total Protein Estimated Needs: 105-120 grams Total Fluid Estimated Needs: >/= 2 L/day  Current Nutrition:  Soft diet Boost Breeze TID, each provides 250 kcal and 9 grams of protein Ensure plus BID TPN  Plan:   At 1800: - Resume full-rate TPN at 85 ml/hr - Electrolytes in TPN: no chg from yesterday Na 1741mq/L K 329m/L Ca 41m31mL Mg 5 mEq/L Phos 141m55mL Cl:Ac max Ac - MVI PO   - continue moderate SSI q6h and adjust as needed - TPN labs on Mon/Thurs - f/u tolerance of PO diet and ability to wean TPN   DrewReuel BoomarmD, BCPS [phone]/2023, 7:54 AM

## 2022-02-14 ENCOUNTER — Encounter: Payer: Self-pay | Admitting: Hematology & Oncology

## 2022-02-14 ENCOUNTER — Inpatient Hospital Stay (HOSPITAL_COMMUNITY): Payer: Medicare Other

## 2022-02-14 ENCOUNTER — Other Ambulatory Visit (HOSPITAL_COMMUNITY): Payer: Self-pay

## 2022-02-14 DIAGNOSIS — R609 Edema, unspecified: Secondary | ICD-10-CM | POA: Diagnosis not present

## 2022-02-14 DIAGNOSIS — I350 Nonrheumatic aortic (valve) stenosis: Secondary | ICD-10-CM

## 2022-02-14 DIAGNOSIS — I82402 Acute embolism and thrombosis of unspecified deep veins of left lower extremity: Secondary | ICD-10-CM

## 2022-02-14 DIAGNOSIS — C252 Malignant neoplasm of tail of pancreas: Secondary | ICD-10-CM | POA: Diagnosis not present

## 2022-02-14 DIAGNOSIS — J869 Pyothorax without fistula: Secondary | ICD-10-CM | POA: Diagnosis not present

## 2022-02-14 DIAGNOSIS — Z7189 Other specified counseling: Secondary | ICD-10-CM | POA: Diagnosis not present

## 2022-02-14 DIAGNOSIS — K56600 Partial intestinal obstruction, unspecified as to cause: Secondary | ICD-10-CM | POA: Diagnosis not present

## 2022-02-14 LAB — COMPREHENSIVE METABOLIC PANEL
ALT: 60 U/L — ABNORMAL HIGH (ref 0–44)
AST: 70 U/L — ABNORMAL HIGH (ref 15–41)
Albumin: <1.5 g/dL — ABNORMAL LOW (ref 3.5–5.0)
Alkaline Phosphatase: 210 U/L — ABNORMAL HIGH (ref 38–126)
Anion gap: 4 — ABNORMAL LOW (ref 5–15)
BUN: 46 mg/dL — ABNORMAL HIGH (ref 8–23)
CO2: 26 mmol/L (ref 22–32)
Calcium: 7.9 mg/dL — ABNORMAL LOW (ref 8.9–10.3)
Chloride: 109 mmol/L (ref 98–111)
Creatinine, Ser: 1.15 mg/dL (ref 0.61–1.24)
GFR, Estimated: >60 mL/min (ref >60)
Glucose, Bld: 160 mg/dL — ABNORMAL HIGH (ref 70–99)
Potassium: 4 mmol/L (ref 3.5–5.1)
Sodium: 139 mmol/L (ref 135–145)
Total Bilirubin: 0.3 mg/dL (ref 0.3–1.2)
Total Protein: 5.3 g/dL — ABNORMAL LOW (ref 6.5–8.1)

## 2022-02-14 LAB — CBC WITH DIFFERENTIAL/PLATELET
Abs Immature Granulocytes: 0.38 10*3/uL — ABNORMAL HIGH (ref 0.00–0.07)
Basophils Absolute: 0.1 10*3/uL (ref 0.0–0.1)
Basophils Relative: 1 %
Eosinophils Absolute: 0.5 10*3/uL (ref 0.0–0.5)
Eosinophils Relative: 3 %
HCT: 32.3 % — ABNORMAL LOW (ref 39.0–52.0)
Hemoglobin: 10 g/dL — ABNORMAL LOW (ref 13.0–17.0)
Immature Granulocytes: 2 %
Lymphocytes Relative: 7 %
Lymphs Abs: 1.3 10*3/uL (ref 0.7–4.0)
MCH: 30 pg (ref 26.0–34.0)
MCHC: 31 g/dL (ref 30.0–36.0)
MCV: 97 fL (ref 80.0–100.0)
Monocytes Absolute: 1.1 10*3/uL — ABNORMAL HIGH (ref 0.1–1.0)
Monocytes Relative: 6 %
Neutro Abs: 15.1 10*3/uL — ABNORMAL HIGH (ref 1.7–7.7)
Neutrophils Relative %: 81 %
Platelets: 1049 10*3/uL (ref 150–400)
RBC: 3.33 MIL/uL — ABNORMAL LOW (ref 4.22–5.81)
RDW: 17.6 % — ABNORMAL HIGH (ref 11.5–15.5)
WBC: 18.4 10*3/uL — ABNORMAL HIGH (ref 4.0–10.5)
nRBC: 0.1 % (ref 0.0–0.2)

## 2022-02-14 LAB — ECHOCARDIOGRAM COMPLETE BUBBLE STUDY
AR max vel: 0.93 cm2
AV Area VTI: 0.97 cm2
AV Area mean vel: 0.98 cm2
AV Mean grad: 17.8 mmHg
AV Peak grad: 32.6 mmHg
Ao pk vel: 2.86 m/s
Area-P 1/2: 2.36 cm2
S' Lateral: 2.4 cm

## 2022-02-14 LAB — APTT: aPTT: 30 seconds (ref 24–36)

## 2022-02-14 LAB — GLUCOSE, CAPILLARY
Glucose-Capillary: 113 mg/dL — ABNORMAL HIGH (ref 70–99)
Glucose-Capillary: 137 mg/dL — ABNORMAL HIGH (ref 70–99)
Glucose-Capillary: 141 mg/dL — ABNORMAL HIGH (ref 70–99)
Glucose-Capillary: 143 mg/dL — ABNORMAL HIGH (ref 70–99)

## 2022-02-14 LAB — MAGNESIUM: Magnesium: 2 mg/dL (ref 1.7–2.4)

## 2022-02-14 LAB — HEPARIN LEVEL (UNFRACTIONATED): Heparin Unfractionated: <0.1 IU/mL — ABNORMAL LOW (ref 0.30–0.70)

## 2022-02-14 LAB — CYTOLOGY - NON PAP

## 2022-02-14 LAB — PHOSPHORUS: Phosphorus: 3.6 mg/dL (ref 2.5–4.6)

## 2022-02-14 LAB — TRIGLYCERIDES: Triglycerides: 130 mg/dL (ref <150)

## 2022-02-14 LAB — PROTIME-INR
INR: 1.2 (ref 0.8–1.2)
Prothrombin Time: 14.9 seconds (ref 11.4–15.2)

## 2022-02-14 MED ORDER — OLANZAPINE 5 MG PO TABS
2.5000 mg | ORAL_TABLET | Freq: Every day | ORAL | Status: DC
  Administered 2022-02-14 – 2022-02-21 (×6): 2.5 mg via ORAL
  Filled 2022-02-14 (×7): qty 1

## 2022-02-14 MED ORDER — HEPARIN BOLUS VIA INFUSION
2250.0000 [IU] | Freq: Once | INTRAVENOUS | Status: AC
  Administered 2022-02-14: 2250 [IU] via INTRAVENOUS
  Filled 2022-02-14: qty 2250

## 2022-02-14 MED ORDER — LINEZOLID 600 MG PO TABS
600.0000 mg | ORAL_TABLET | Freq: Two times a day (BID) | ORAL | Status: DC
  Administered 2022-02-14 – 2022-02-15 (×3): 600 mg via ORAL
  Filled 2022-02-14 (×5): qty 1

## 2022-02-14 MED ORDER — HEPARIN (PORCINE) 25000 UT/250ML-% IV SOLN
1550.0000 [IU]/h | INTRAVENOUS | Status: DC
Start: 2022-02-14 — End: 2022-02-15
  Administered 2022-02-14: 1200 [IU]/h via INTRAVENOUS
  Administered 2022-02-15: 1350 [IU]/h via INTRAVENOUS
  Filled 2022-02-14 (×2): qty 250

## 2022-02-14 MED ORDER — HEPARIN BOLUS VIA INFUSION
2000.0000 [IU] | Freq: Once | INTRAVENOUS | Status: AC
  Administered 2022-02-14: 2000 [IU] via INTRAVENOUS
  Filled 2022-02-14: qty 2000

## 2022-02-14 MED ORDER — TRAVASOL 10 % IV SOLN
INTRAVENOUS | Status: AC
  Filled 2022-02-14: qty 1122

## 2022-02-14 NOTE — Progress Notes (Signed)
PROGRESS NOTE Kevin Villegas  NGE:[id number] DOB: 04-May-1941 DOA: 01/28/2022 PCP: Dettinger, [name], MD   Brief Narrative/Hospital Course: 81 year old male with stage IV pancreatic cancer status post colectomy, hydronephrosis status post left nephrostomy, hypertension, SBO, CKD stage III who presented with abdominal pain, found to have small bowel obstruction status post diagnostic laparoscopy with lysis of adhesion on 2/40 measuring NG suction n.p.o. while waiting for return of bowel function.  Followed by surgery oncology.  Hospital course complicated by coffee-ground colored secretion from NG tube, leukocytosis. -Repeat CT scan 2/20 no bowel obstruction, no abscess moderate amount of free fluid in the right upper quadrant, body wall edema noted to the right anterior wall. With persistent leukocytosis but afebrile, being managed with TPN, IV antibiotics-.  Patient completed IV Zosyn 2/17-2/22.  Diet was slowly advanced NG tube removed at this time patient is tolerating diet having output in colostomy bag, having output in nephrostomy tube.  CT scan 2/21 no intra-abdominal abscess or obstruction but shows enlarging left pleural effusion.  Patient had persistent leukocytosis ID consulted, underwent left pleural diagnostic tap with 50 mL appears exudative fluid but Gram stain negative, total nucleated cells 25,000, neutrophils100 2/24:placed on Zyvox and underwent small bore chest tube placement by PCCM, lytics done 2/25. 2/26-chest tube placed to suction.    Subjective: Seen and examined this morning.  Appears more alert awake today Nursing reports he refused all oral medication this morning He was refusing labs for heparin but after my discussion and counseling he agreed Overnight afebrile, on room air blood pressure stable chest tube with 650 ml output, stool in colostomy 100 mL,Nephrostomy tube with 1950 mL  Assessment and Plan: * Partial small bowel obstruction ([place])- (present on  admission) Stage IV pancreatic cancer with partial SBO s/p  Surgery CT 2/25 with small abscess in the abdominal wall Persistent abdominal pain distention nausea vomiting: S/p LOA, SBR,biopsy 2/14- positive for ACC with mucinous features of left colon mesentery and jejunum. Off NG tube 2/21, S/P Zosyn  2/17>>2/22. CT abdomen 2/25-small abscess in the abdominal wall On wound care, zyvox. Colostomy having output, on soft diet and TPN but having persistent abdominal pain distention and nausea vomiting-downgrading to clear liquid diet, continue TPN. Surgery following closely discussed this morning  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  with metastatic disease- (present on admission) See #1.Oncology on board.  Dr. Marin Olp following less likely will be candidate for systemic chemotherapy unless he is able to return home per hem-onc. Oncology to discuss goals of care/prognosis notified Dr. Marin Olp   Empyema of left pleural space ([place]) S/P thick purulent drainage- 50 ml by IR 2/23- grew Enterococcus, likely seeding previously known loculated left effusion likely malignant. S/p small bore chest tube placement 2/24 Dr Leandra Kern a candidate for surgery. ID following and started on Zyvox 2.24. lytics done 2/25. 2/26-chest tube placed to suction-pulmonary managing closely. Remains on room air. Follow-up pleural fluid cytology. Follow-up daily x-ray pending today  Leukocytosis Peaked to 38k-now improving.  This is due to patient's empyema and abdominal wall abscess. Recent Labs  Lab 02/10/22 0812 02/11/22 0607 02/12/22 1039 02/13/22 0623 02/14/22 0713  WBC 38.6* 35.5* 31.2* 24.3* 18.4*    E-coli UTI Completed antibiotics   Hydronephrosis of left kidney- (present on admission) Due to tumor compression, s/p left nephrostomy last changed on 1/30, renal ultrasound on 2/17 -chronic right hydronephrosis with atrophic right kidney, urology advised outpatient follow-up.  Nephrostomy  draining well  Leg DVT (deep venous thromboembolism), acute, left ([place]) -  duplex of the left leg + for acute DVT in left mid and distal femoral vein, posterior tibial vein and peroneal vein.  This in the setting of his metastatic cancer, postop status immobility despite being on Lovenox prophylaxis.Consult pharmacy for heparin gtt-patient is refusing lab- if he continues to refuse labs for heparin - could consider Lovenox treatment dose versus Eliquis, will await for Hem onc inputs too  Goals of care, counseling/discussion Patient with metastatic pancreatic cancer now with complicated course and hospitalization appears ill deconditioned with failure to thrive.Remains DNR.  Continue ongoing goals of care.  Continue ongoing pain management per palliative care.  Palliative care to have meeting today overall prognosis does not appear bright-patient continues to have persistent abdominal pain with nausea vomiting-along with complex other medical issues DVT deconditioning failure to thrive.  Oncology as well to discuss w/ family  Stage 3a chronic kidney disease (CKD) ([place])- (present on admission) AKI with creatinine at 1.59 improved to 1.1-1.2 with ivf, tpn.  Trend Recent Labs  Lab 02/09/22 0610 02/10/22 0812 02/12/22 0551 02/13/22 0623 02/14/22 0713  BUN 50* 51*   50* 55* 52* 46*  CREATININE 1.50* 1.23   1.20 1.19 1.24 1.15    Colostomy in place [facility]) See #1  Essential hypertension- (present on admission) Controlled, not on meds.  Protein-calorie malnutrition, severe- (present on admission) Augment nutritional status as tolerated.  Dietitian on board.  On Soft diet/TPN and wean TPN as tolerated Nutrition Problem: Severe Malnutrition Etiology: chronic illness, cancer and cancer related treatments Signs/Symptoms: severe fat depletion, severe muscle depletion, percent weight loss Interventions: TPN    DVT prophylaxis: SCDs Start: 01/29/22 0013 Code Status:   Code Status: DNR Family  Communication:plan of care discussed with patient/wife was updated previously.  Palliative care met with wife and daughter and updated 2/25.    Disposition:Currently not medically stable for discharge. Status EN:IDPOEUMPN Remains Inpatient appropriate because: ongoing management of SBO/empyema   Objective: Vitals last 24 hrs: Vitals:   02/14/22 0011 02/14/22 0058 02/14/22 0407 02/14/22 0500  BP: (!) 144/89 121/85 (!) 149/90   Pulse: 77 71 72   Resp: 16 16 16    Temp: 98.4 F (36.9 C) 98.7 F (37.1 C) (!) 97.5 F (36.4 C)   TempSrc:  Oral Oral   SpO2: 95% 94% 97%   Weight:    69.8 kg  Height:       Weight change: 0.1 kg  Physical Examination: General exam: Alert awake, ill looking frai, frail, weak appearing. HEENT:Oral mucosa moist, Ear/Nose WNL grossly, dentition normal. Respiratory system: bilaterally clear,no use of accessory muscle Cardiovascular system: S1 & S2 +, No JVD,. Gastrointestinal system: Abdomen soft, generalized tenderness bowel sounds sluggish, mildly distended.  Colostomy in place Nervous System:Alert, awake, moving extremities and grossly nonfocal Extremities: edema lt>rt le,distal peripheral pulses palpable.  Skin: No rashes,no icterus. MSK: Normal muscle bulk,tone, power Colostomy in place, chest tube in place and nephrostomy tube in place  Medications reviewed:  Scheduled Meds:  Chlorhexidine Gluconate Cloth  6 each Topical Daily   feeding supplement  1 Container Oral TID BM   feeding supplement  237 mL Oral BID BM   insulin aspart  0-15 Units Subcutaneous Q6H   lidocaine  1 patch Transdermal Q24H   linezolid  600 mg Oral Q12H   lip balm  1 application Topical BID   methocarbamol  500 mg Oral QID   multivitamin with minerals  1 tablet Oral Daily   sodium chloride flush  10 mL Other  Q8H   sodium chloride flush  10 mL Other Q8H   sodium chloride flush  3 mL Intravenous Q12H   Continuous Infusions:  sodium chloride Stopped (01/28/22 2301)    heparin 1,200 Units/hr (02/14/22 0954)   TPN ADULT (ION) 85 mL/hr at 02/13/22 1739   TPN ADULT (ION)        Diet Order             Diet clear liquid Room service appropriate? Yes; Fluid consistency: Thin  Diet effective now                    Nutrition Problem: Severe Malnutrition Etiology: chronic illness, cancer and cancer related treatments Signs/Symptoms: severe fat depletion, severe muscle depletion, percent weight loss Interventions: TPN   Intake/Output Summary (Last 24 hours) at 02/14/2022 1059 Last data filed at 02/14/2022 1040 Gross per 24 hour  Intake 1791.29 ml  Output 2480 ml  Net -688.71 ml   Net IO Since Admission: 7,655.62 mL [02/14/22 1059]  Wt Readings from Last 3 Encounters:  02/14/22 69.8 kg  01/07/22 63.5 kg  12/31/21 63.5 kg     Unresulted Labs (From admission, onward)     Start     Ordered   02/15/22 0500  Comprehensive metabolic panel  Tomorrow morning,   R       Question:  Specimen collection method  Answer:  Lab=Lab collect   02/14/22 1030   02/15/22 0500  Magnesium  Tomorrow morning,   R       Question:  Specimen collection method  Answer:  Lab=Lab collect   02/14/22 1030   02/15/22 0500  Phosphorus  Tomorrow morning,   R       Question:  Specimen collection method  Answer:  Lab=Lab collect   02/14/22 1030   02/14/22 1800  Heparin level (unfractionated)  Once-Timed,   TIMED       Question:  Specimen collection method  Answer:  Lab=Lab collect   02/14/22 1008   02/14/22 0713  Pathologist smear review  Once,   R        02/14/22 0713   02/13/22 0500  CBC with Differential/Platelet  Daily,   R     Question:  Specimen collection method  Answer:  Lab=Lab collect   02/12/22 0649   02/13/22 0500  Comprehensive metabolic panel  Daily,   R     Question:  Specimen collection method  Answer:  Lab=Lab collect   02/12/22 0841   01/31/22 0500  Comprehensive metabolic panel  (TPN Lab Panel)  Every Mon,Thu (0500),   R     Question:  Specimen  collection method  Answer:  Unit=Unit collect   01/30/22 0742   01/31/22 0500  Magnesium  (TPN Lab Panel)  Every Mon,Thu (0500),   R     Question:  Specimen collection method  Answer:  Unit=Unit collect   01/30/22 0742   01/31/22 0500  Phosphorus  (TPN Lab Panel)  Every Mon,Thu (0500),   R     Question:  Specimen collection method  Answer:  Unit=Unit collect   01/30/22 0742   01/31/22 0500  Triglycerides  (TPN Lab Panel)  Every Monday (0500),   R     Question:  Specimen collection method  Answer:  Unit=Unit collect   01/30/22 6834          Data Reviewed: I have personally reviewed following labs and imaging studies CBC: Recent Labs  Lab 02/10/22 0812 02/11/22 0607 02/12/22  1039 02/13/22 0623 02/14/22 0713  WBC 38.6* 35.5* 31.2* 24.3* 18.4*  NEUTROABS  --   --   --  20.2* 15.1*  HGB 10.7* 10.8* 10.1* 10.4* 10.0*  HCT 33.7* 35.2* 33.1* 33.7* 32.3*  MCV 95.5 97.5 99.1 96.8 97.0  PLT 771* 854* 866* 1,086* 1,027*   Basic Metabolic Panel: Recent Labs  Lab 02/09/22 0610 02/10/22 0812 02/12/22 0551 02/13/22 0623 02/14/22 0713  NA 140 143   142 141 142 139  K 3.6 4.0   4.0 3.8 4.5 4.0  CL 107 112*   112* 114* 109 109  CO2 25 24   23 23 26 26   GLUCOSE 173* 160*   160* 177* 113* 160*  BUN 50* 51*   50* 55* 52* 46*  CREATININE 1.50* 1.23   1.20 1.19 1.24 1.15  CALCIUM 8.3* 8.3*   8.2* 8.2* 8.5* 7.9*  MG 2.3 2.3 2.2  --  2.0  PHOS 4.0 3.6  --   --  3.6   GFR: Estimated Creatinine Clearance: 50.6 mL/min (by C-G formula based on SCr of 1.15 mg/dL). Liver Function Tests: Recent Labs  Lab 02/08/22 0606 02/09/22 0610 02/10/22 0812 02/13/22 0623 02/14/22 0713  AST 142* 108* 102* 83* 70*  ALT 114* 94* 91* 65* 60*  ALKPHOS 245* 207* 204* 224* 210*  BILITOT 0.6 0.8 0.7 0.3 0.3  PROT 5.4* 5.4* 5.8* 5.7* 5.3*  ALBUMIN 1.6* 1.5* 1.6* <1.5* <1.5*   No results for input(s): LIPASE, AMYLASE in the last 168 hours. No results for input(s): AMMONIA in the last 168  hours. Coagulation Profile: Recent Labs  Lab 02/14/22 0950  INR 1.2   Cardiac Enzymes: No results for input(s): CKTOTAL, CKMB, CKMBINDEX, TROPONINI in the last 168 hours. BNP (last 3 results) No results for input(s): PROBNP in the last 8760 hours. HbA1C: No results for input(s): HGBA1C in the last 72 hours. CBG: Recent Labs  Lab 02/13/22 0507 02/13/22 1148 02/13/22 1731 02/14/22 0014 02/14/22 0533  GLUCAP 93 121* 98 141* 137*   Lipid Profile: Recent Labs    02/14/22 0713  TRIG 130    Thyroid Function Tests: No results for input(s): TSH, T4TOTAL, FREET4, T3FREE, THYROIDAB in the last 72 hours. Anemia Panel: No results for input(s): VITAMINB12, FOLATE, FERRITIN, TIBC, IRON, RETICCTPCT in the last 72 hours. Sepsis Labs: Recent Labs  Lab 02/10/22 2536 02/11/22 0607 02/12/22 0551  PROCALCITON 1.88 1.41 1.37     Recent Results (from the past 240 hour(s))  Culture, blood (routine x 2)     Status: None   Collection Time: 02/04/22  3:42 PM   Specimen: BLOOD  Result Value Ref Range Status   Specimen Description BLOOD LEFT ANTECUBITAL  Final   Special Requests   Final    BOTTLES DRAWN AEROBIC AND ANAEROBIC Blood Culture adequate volume   Culture   Final    NO GROWTH 5 DAYS Performed at [facility], [address] [place], [place] [address]    Report Status 02/09/2022 FINAL  Final  Culture, blood (routine x 2)     Status: None   Collection Time: 02/04/22  3:42 PM   Specimen: BLOOD  Result Value Ref Range Status   Specimen Description BLOOD RIGHT ANTECUBITAL  Final   Special Requests   Final    BOTTLES DRAWN AEROBIC ONLY Blood Culture results may not be optimal due to an inadequate volume of blood received in culture bottles   Culture   Final    NO  GROWTH 5 DAYS Performed at [facility], [place] [address] [place], [place] [address]    Report Status 02/09/2022 FINAL  Final  Body fluid culture w Gram Stain     Status: None   Collection Time:  02/10/22  1:37 PM   Specimen: PATH Cytology Pleural fluid  Result Value Ref Range Status   Specimen Description   Final    PLEURAL Performed at [facility] [address] [place], [place] [address]    Special Requests   Final    NONE Performed at [facility], [place] [address], [place], [place] [address]    Gram Stain FEW WBC SEEN NO ORGANISMS SEEN   Final   Culture   Final    MODERATE ENTEROCOCCUS FAECIUM VANCOMYCIN RESISTANT ENTEROCOCCUS CRITICAL RESULT CALLED TO, READ BACK BY AND VERIFIED WITH: RN LISA.P AT 1144 ON 02/11/2022 BY T.SAAD. Performed at [facility], [place] [address], [place], [place] [address]    Report Status 02/13/2022 FINAL  Final   Organism ID, Bacteria ENTEROCOCCUS FAECIUM  Final      Susceptibility   Enterococcus faecium - MIC*    AMPICILLIN >=32 RESISTANT Resistant     VANCOMYCIN >=32 RESISTANT Resistant     GENTAMICIN SYNERGY SENSITIVE Sensitive     LINEZOLID 2 SENSITIVE Sensitive     * MODERATE ENTEROCOCCUS FAECIUM    Antimicrobials: Anti-infectives (From admission, onward)    Start     Dose/Rate Route Frequency Ordered Stop   02/14/22 1015  linezolid (ZYVOX) tablet 600 mg        600 mg Oral Every 12 hours 02/14/22 0929     02/11/22 1330  linezolid (ZYVOX) IVPB 600 mg  Status:  Discontinued        600 mg 300 mL/hr over 60 Minutes Intravenous Every 12 hours 02/11/22 1234 02/14/22 0929   02/04/22 1545  piperacillin-tazobactam (ZOSYN) IVPB 3.375 g        3.375 g 12.5 mL/hr over 240 Minutes Intravenous Every 8 hours 02/04/22 1453 02/09/22 1359   02/01/22 2200  ceFAZolin (ANCEF) IVPB 1 g/50 mL premix  Status:  Discontinued        1 g 100 mL/hr over 30 Minutes Intravenous Every 8 hours 02/01/22 1504 02/04/22 1453   02/01/22 0930  cefoTEtan (CEFOTAN) 2 g in sodium chloride 0.9 % 100 mL IVPB        2 g 200 mL/hr over 30 Minutes Intravenous On call to O.R. 01/31/22 1908 02/01/22 1533   01/30/22 1000   cefTRIAXone (ROCEPHIN) 1 g in sodium chloride 0.9 % 100 mL IVPB  Status:  Discontinued        1 g 200 mL/hr over 30 Minutes Intravenous Every 24 hours 01/29/22 0013 02/01/22 1504   01/28/22 1830  cefTRIAXone (ROCEPHIN) 1 g in sodium chloride 0.9 % 100 mL IVPB        1 g 200 mL/hr over 30 Minutes Intravenous  Once 01/28/22 1828 01/28/22 1927      Culture/Microbiology    Component Value Date/Time   SDES  02/10/2022 1337    PLEURAL Performed at [facility], [place] [address], [place], [place] [address]    SPECREQUEST  02/10/2022 1337    NONE Performed at [facility], [place] [address], [place], [place] [address]    CULT  02/10/2022 1337    MODERATE ENTEROCOCCUS FAECIUM VANCOMYCIN RESISTANT ENTEROCOCCUS CRITICAL RESULT CALLED TO, READ BACK BY AND VERIFIED WITH: RN  LISA.P AT 1144 ON 02/11/2022 BY T.SAAD. Performed at [facility], [place] [address], [place],  [address]    REPTSTATUS 02/13/2022 FINAL 02/10/2022 1337  Radiology Studies: CT ABDOMEN PELVIS WO CONTRAST  Result Date: 02/12/2022 CLINICAL DATA:  Abdominal pain, pancreatic carcinoma EXAM: CT ABDOMEN AND PELVIS WITHOUT CONTRAST TECHNIQUE: Multidetector CT imaging of the abdomen and pelvis was performed following the standard protocol without IV contrast. RADIATION DOSE REDUCTION: This exam was performed according to the departmental dose-optimization program which includes automated exposure control, adjustment of the mA and/or kV according to patient size and/or use of iterative reconstruction technique. COMPARISON:  02/07/2022 FINDINGS: Lower chest: There is interval placement of left chest tube with a decrease in the amount of left pleural effusion. There is residual moderate bilateral pleural effusions. Atelectatic changes are noted in the lower lung fields, more so on the left side. There are multiple noncalcified nodules of varying sizes in both lower lung fields suggesting pulmonary  metastatic disease. Hepatobiliary: There are few low-density lesions in the liver largest measuring 2.3 cm in the right lobe. There is no significant interval change. Density measurements suggest possible cysts. Surgical clips are seen in gallbladder fossa. Pancreas: No focal abnormality is seen. Spleen: Not seen Adrenals/Urinary Tract: There is mild nodularity in the adrenals. There is marked right hydronephrosis. Right ureter is dilated. There is percutaneous left nephrostomy tube ureteral stent. There is no hydronephrosis in the left kidney. There is 3.1 cm cyst in the upper pole of right kidney. Right kidney is smaller than left with cortical atrophy. Stomach/Bowel: Stomach is not distended. There is stent in the distal duodenum. There is dilation of small-bowel loops measuring up to 5.5 cm in maximum diameter. Appendix is not distinctly seen. Scattered diverticula are seen in colon without signs of focal diverticulitis. There is liquid stool in the rectum. Colostomy is seen in left paramedian location slightly above the level of umbilicus. Vascular/Lymphatic: Scattered arterial calcifications are seen. Reproductive: Unremarkable. Other: There is diffuse edema in subcutaneous plane suggesting anasarca. There is 2.7 cm soft tissue nodule in the right anterior abdominal wall with no significant change. There is no pneumoperitoneum. Small to moderate ascites is present. Musculoskeletal: Degenerative changes are noted in the lumbar spine with spinal stenosis and encroachment of neural foramina at multiple levels. IMPRESSION: There is decrease in left pleural effusion after placement of left chest tube. Moderate bilateral pleural effusions are present. There are multiple noncalcified nodules of varying sizes in the lower lung fields consistent with pulmonary metastatic disease. There is dilation of small-bowel loops with interval worsening suggesting ileus or partial obstruction. Diverticulosis of colon without signs  of diverticulitis. Severe chronic dilation of collecting system and ureter on the right side with no significant change. There is percutaneous nephrostomy and ureteral stent on the left side. Small to moderate ascites. Other findings as described in the body of the report. Electronically Signed   By: Elmer Picker M.D.   On: 02/12/2022 14:01   DG CHEST PORT 1 VIEW  Result Date: 02/13/2022 CLINICAL DATA:  Empyema lung EXAM: PORTABLE CHEST 1 VIEW COMPARISON:  02/11/2022 FINDINGS: Left basilar chest tube. Right chest wall port with catheter tip unchanged. Bilateral pulmonary opacities with nodularity are again identified. Overall aeration is slightly worse. Increased lucency at the left costophrenic angle suggesting greater pleural air conjunction with residual effusion. Similar cardiomediastinal contours. IMPRESSION: Bilateral pulmonary opacities with nodularity. Aeration is slightly worse. Increased lucency at the left costophrenic angle likely represents greater pneumothorax  component of a hydropneumothorax. Electronically Signed   By: Macy Mis M.D.   On: 02/13/2022 11:39   VAS [place] LOWER EXTREMITY VENOUS (DVT)  Result Date: 02/14/2022  Lower Venous DVT Study Patient Name:  IZZY COURVILLE  Date of Exam:   02/14/2022 Medical Rec #: [id number]       Accession #:    [id number] Date of Birth: 1941-09-24       Patient Gender: M Patient Age:   59 years Exam Location:  [facility] Procedure:      VAS [place] LOWER EXTREMITY VENOUS (DVT) Referring Phys: [place] --------------------------------------------------------------------------------  Indications: Edema.  Comparison Study: No prior study Performing Technologist: Maudry Mayhew MHA, RDMS, RVT, RDCS  Examination Guidelines: A complete evaluation includes B-mode imaging, spectral Doppler, color Doppler, and power Doppler as needed of all accessible portions of each vessel. Bilateral testing is considered an integral part of a complete  examination. Limited examinations for reoccurring indications may be performed as noted. The reflux portion of the exam is performed with the patient in reverse Trendelenburg.  +-----+---------------+---------+-----------+----------+--------------+  RIGHT Compressibility Phasicity Spontaneity Properties Thrombus Aging  +-----+---------------+---------+-----------+----------+--------------+  CFV   Full            Yes       Yes                                    +-----+---------------+---------+-----------+----------+--------------+   +---------+---------------+---------+-----------+----------+-----------------+  LEFT      Compressibility Phasicity Spontaneity Properties Thrombus Aging     +---------+---------------+---------+-----------+----------+-----------------+  CFV       Full            No        Yes                                       +---------+---------------+---------+-----------+----------+-----------------+  SFJ       Full                                                                +---------+---------------+---------+-----------+----------+-----------------+  FV Prox   Full                                                                +---------+---------------+---------+-----------+----------+-----------------+  FV Mid    None                      No                     Acute              +---------+---------------+---------+-----------+----------+-----------------+  FV Distal None                      No                     Acute              +---------+---------------+---------+-----------+----------+-----------------+  PFV       Full                                                                +---------+---------------+---------+-----------+----------+-----------------+  POP       Full            No        Yes                                       +---------+---------------+---------+-----------+----------+-----------------+  PTV       None            Yes       Yes                    Age  Indeterminate  +---------+---------------+---------+-----------+----------+-----------------+  PERO      None                      No                     Acute              +---------+---------------+---------+-----------+----------+-----------------+    Summary: RIGHT: - No evidence of common femoral vein obstruction.  LEFT: - Findings consistent with acute deep vein thrombosis involving the left femoral vein, and left peroneal veins. - Findings consistent with age indeterminate deep vein thrombosis involving the left peroneal veins.  *See table(s) above for measurements and observations. Electronically signed by Servando Snare MD on 02/14/2022 at 10:42:12 AM.    Final      LOS: 64 days   Antonieta Pert, MD [facility]  02/14/2022, 10:59 AM

## 2022-02-14 NOTE — Progress Notes (Addendum)
[place] for Infectious Disease  Date of Admission:  01/28/2022   Total days of inpatient antibiotics 12  Principal Problem:   Partial small bowel obstruction (HCC) Active Problems:   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)M1  with metastatic disease   Leukocytosis   Protein-calorie malnutrition, severe   Hydronephrosis of left kidney   Essential hypertension   Colostomy in place [facility])   Stage 3a chronic kidney disease (CKD) ([place])   E-coli UTI   Goals of care, counseling/discussion   Empyema of left pleural space (HCC)   Empyema lung (HCC)   Leg edema, left   Leg DVT (deep venous thromboembolism), acute, left ([place])          Assessment: 81YM with metastatic pancreatic cancer admitted for recurrent small bowel obstruction underwent LAP on 2/14. Hospital course complicated by [facility] UTI(treated with pip-tazo). Pt had increased wbc despite antibiotics with enlarging pleural effusion.   #Left sided empyema with VRE SP chest tube placement on 2/24 #Pancreatic Cancer with metastatic disease to lungs -SP thoracentesis on 2/23 with Cx+ E faecium (VRE). Cytology did not show malignant cell -Palliative care following Recommendations: -Switch  linezolid to PO. Will likely need atleast 4 weeks of antibiotics(plan on switching to doxycyline after 2 weeks of linezolid to avoid  adverse effects) -Continue to monitor clinically -TTE Microbiology:   Antibiotics: Linezolid 2/24-p Pip-tazo 2/17-2/21 Cefazolin 2/14-2/17 Ceftriaxone 2/13 Cefotetan 2/14 Cultures:  Other Thoracentesis on 2/23 VRE E facium  SUBJECTIVE: Resting in bed on RA. Denies cough or SHOB.  Interval: Afebrile overnight. Wbc 18K.  Review of Systems: Review of Systems  All other systems reviewed and are negative.   Scheduled Meds:  Chlorhexidine Gluconate Cloth  6 each Topical Daily   feeding supplement  1 Container Oral TID BM   feeding supplement  237 mL Oral BID BM   insulin aspart   0-15 Units Subcutaneous Q6H   lidocaine  1 patch Transdermal Q24H   linezolid  600 mg Oral Q12H   lip balm  1 application Topical BID   methocarbamol  500 mg Oral QID   multivitamin with minerals  1 tablet Oral Daily   sodium chloride flush  10 mL Other Q8H   sodium chloride flush  10 mL Other Q8H   sodium chloride flush  3 mL Intravenous Q12H   Continuous Infusions:  sodium chloride Stopped (01/28/22 2301)   heparin 1,200 Units/hr (02/14/22 0954)   TPN ADULT (ION) 85 mL/hr at 02/13/22 1739   TPN ADULT (ION)     PRN Meds:.sodium chloride, acetaminophen, alum & mag hydroxide-simeth, antiseptic oral rinse, diphenhydrAMINE **OR** diphenhydrAMINE, fentaNYL (SUBLIMAZE) injection, iohexol, menthol-cetylpyridinium, ondansetron (ZOFRAN) IV, oxyCODONE, phenol, prochlorperazine, simethicone, sodium chloride flush Allergies  Allergen Reactions   Pollen Extract Other (See Comments)    Sinus congestion    OBJECTIVE: Vitals:   02/14/22 0058 02/14/22 0407 02/14/22 0500 02/14/22 1302  BP: 121/85 (!) 149/90  (!) 133/95  Pulse: 71 72  80  Resp: 16 16    Temp: 98.7 F (37.1 C) (!) 97.5 F (36.4 C)  98.1 F (36.7 C)  TempSrc: Oral Oral  Oral  SpO2: 94% 97%  94%  Weight:   69.8 kg   Height:       Body mass index is 21.46 kg/m.  Physical Exam Constitutional:      General: He is not in acute distress.    Appearance: He is normal weight. He is not toxic-appearing.  HENT:     Head: Normocephalic and atraumatic.     Right Ear: External ear normal.     Left Ear: External ear normal.     Nose: No congestion or rhinorrhea.     Mouth/Throat:     Mouth: Mucous membranes are moist.     Pharynx: Oropharynx is clear.  Eyes:     Extraocular Movements: Extraocular movements intact.     Conjunctiva/sclera: Conjunctivae normal.     Pupils: Pupils are equal, round, and reactive to light.  Cardiovascular:     Rate and Rhythm: Normal rate and regular rhythm.     Heart sounds: No murmur heard.   No  friction rub. No gallop.  Pulmonary:     Effort: Pulmonary effort is normal.     Breath sounds: Normal breath sounds.     Comments: Left chest tube in place Abdominal:     General: Abdomen is flat. Bowel sounds are normal.     Palpations: Abdomen is soft.  Musculoskeletal:        General: No swelling. Normal range of motion.     Cervical back: Normal range of motion and neck supple.  Skin:    General: Skin is warm and dry.  Neurological:     General: No focal deficit present.     Mental Status: He is oriented to person, place, and time.  Psychiatric:        Mood and Affect: Mood normal.      Lab Results Lab Results  Component Value Date   WBC 18.4 (H) 02/14/2022   HGB 10.0 (L) 02/14/2022   HCT 32.3 (L) 02/14/2022   MCV 97.0 02/14/2022   PLT 1,049 (HH) 02/14/2022    Lab Results  Component Value Date   CREATININE 1.15 02/14/2022   BUN 46 (H) 02/14/2022   NA 139 02/14/2022   K 4.0 02/14/2022   CL 109 02/14/2022   CO2 26 02/14/2022    Lab Results  Component Value Date   ALT 60 (H) 02/14/2022   AST 70 (H) 02/14/2022   ALKPHOS 210 (H) 02/14/2022   BILITOT 0.3 02/14/2022        Laurice Record, [place] for Infectious Disease [facility] 02/14/2022, 1:57 PM

## 2022-02-14 NOTE — Progress Notes (Addendum)
OT Cancellation Note  Patient Details Name: Kevin Villegas MRN: [id number] DOB: 03-Dec-1941   Cancelled Treatment:    Reason Eval/Treat Not Completed: Patient not medically ready;Other (comment) (Pt with acute DVT in LLE and platelets >1000K/uL. Will hold until pt has been on heparin for 24 hrs.) Shanon Payor, OTD OTR/L  02/14/22, 11:00 AM

## 2022-02-14 NOTE — Progress Notes (Signed)
PHARMACY - TOTAL PARENTERAL NUTRITION CONSULT NOTE   Indication: Small bowel obstruction and inability to meet nutritional needs w/ PO intake  Patient Measurements: Height: 5' 11" (180.3 cm) Weight: 69.8 kg (153 lb 14.1 oz) IBW/kg (Calculated) : 75.3 TPN AdjBW (KG): 57.7 Body mass index is 21.46 kg/m. Usual Weight: Weight noted in November 2022 to be 63.5 kg  Assessment: 81 yo male presenting to [facility] ED for abdominal pain, nausea, vomiting. PMH includes pancreatic cancer with evidence of metastatic disease to his bladder, lungs, and pleural space and history of SBO.  S/p stent placement between stomach and intestines and colostomy placement in June 2022. Pharmacy is consulted to begin TPN for possible bridge to surgery given low pre-albumin and inability to meet nutritional needs w/ PO intake.   Glucose / Insulin: No hx DM; CBGs at goal on moderate SSI q6h - 4 units SSI used yesterday Electrolytes: All, including Corrected Calcium, WNL Renal: SCr WNL, BUN elevated but decreased to 46; UOP remains adequate Hepatic: AST/ALT elevated, but trending down. Alk Phos elevated, but trending down. Tbili stable/WNL. Albumin low. Prealbumin low at 8.1. TG WNL, 130 (2/27) Intake / Output; MIVF: I/O 1950/2730; no IVF 0% of meals charted GI Imaging: - 2/10 CT abdomen pelvis: re-demonstration of high-grade partial SBO with transition point in the upper left pelvis psych likely due to adhesion.  - 2/20 abd CT: No evidence of postoperative intra-abdominal abscess. BL pleural effusions. Pulmonary metastatic disease in the lung bases with multiple pulmonary nodules. - 2/25 CT: worsened SB loop dilation suggesting ileus or pSBO GI Surgeries / Procedures:  - 2/14: laparoscopic lysis of adhesions, small bowel resection, excisional bipsy of mesentery mass   Central access: PAC TPN start date: 2/12  Nutritional Goals: Goal TPN rate is 85 mL/hr (provides 112g of protein and 2061 kcals per day)  RD Assessment:   Estimated Needs Total Energy Estimated Needs: 2050-2250 kcal Total Protein Estimated Needs: 105-120 grams Total Fluid Estimated Needs: >/= 2 L/day  Current Nutrition:  Soft diet > clear liquid diet Boost Breeze TID (currently refusing) Ensure Plus BID (currently refusing) TPN  Plan:  At 1800: - Continue full-rate TPN at 85 ml/hr - Electrolytes in TPN: no change from yesterday Na 147mq/L K 34m/L Ca 8m42mL Mg 5 mEq/L Phos 18m26mL Cl:Ac max Ac - Continue MVI PO   - Continue moderate SSI q6h and adjust as needed - TPN labs on Mon/Thurs - CMET, Mg, Phos in AM - f/u ability to advance diet and wean TPN when able   JignLindell SpararmD, BCPS Clinical Pharmacist  02/14/2022, 10:12 AM

## 2022-02-14 NOTE — Progress Notes (Signed)
Wife called and made aware of patient's room change, plans to be at bedside at 1300 for Palliative Meeting. Patient A&O to self and place, confused to time and situation. Patient irritable and restless, does not appear comfortable but refuses many interventions. Currently resting in bed, no signs of distress. Chest tube flushed as ordered with no complications, drainage system changed also. Connected to suction as ordered. Heparin initiated with no complications. Will continue to monitor and change dressings as patient allows.

## 2022-02-14 NOTE — Progress Notes (Signed)
Echocardiogram 2D Echocardiogram has been performed.  Arlyss Gandy 02/14/2022, 4:09 PM

## 2022-02-14 NOTE — Progress Notes (Signed)
Daily Progress Note   Patient Name: Kevin Villegas       Date: 02/14/2022 DOB: 09-23-1941  Age: 81 y.o. MRN#: [id number] Attending Physician: Antonieta Pert, MD Primary Care Physician: Dettinger, [name], MD Admit Date: 01/28/2022  Reason for Consultation/Follow-up: Establishing goals of care  Subjective: I met today with Kevin Villegas in conjunction with his wife.  He has been noted to intermittently be confused throughout the day but he is awake and alert at time of my encounter.  He participates in conversation and seems to have insight into what has been going on.  We discussed his current clinical situation and the fact that he has multiple comorbidities going on, some of which have potential to improve, and some of which are not going to go away regardless of interventions moving forward.  Discussed underlying cancer and that this is primary driving factor for his other comorbidities and is not a curable illness.  We also discussed current problems of poor appetite, anorexia, bowel surgery, local wound infection, loculated effusion with chest tube and new discovery of DVT.  We talked about my concern that he is not making significant progress towards discharge from the hospital and his wife reports he has had, "backward steps" that she attributes to him not being listened to.  For example, he has new discovery of DVT and she states that she told somebody about him having a swollen legs a couple of days before any testing was done.  This and prior discussion with her about antibiotics has her very frustrated.  We discussed again concern that he may not reach a point of being a candidate for chemotherapy due to the fact he still has very poor functional status, poor nutritional status, and  further complications that have caused him to become extremely weak and frail.  He again expressed that he feels that Dr. Marin Olp is the person who knows his case the best and is relying on him to help guide long-term decisions.  We discussed again regarding his concern about anorexia, poor sleep, and nausea.  He and his wife would like to see if there is anything else that may assist with this.  We discussed trial of Zyprexa.  He asked to proceed.  Length of Stay: 16  Current Medications: Scheduled Meds:   Chlorhexidine Gluconate Cloth  6 each Topical Daily   feeding supplement  1 Container Oral TID BM   feeding supplement  237 mL Oral BID BM   insulin aspart  0-15 Units Subcutaneous Q6H   lidocaine  1 patch Transdermal Q24H   linezolid  600 mg Oral Q12H   lip balm  1 application Topical BID   methocarbamol  500 mg Oral QID   multivitamin with minerals  1 tablet Oral Daily   OLANZapine  2.5 mg Oral QHS   sodium chloride flush  10 mL Other Q8H   sodium chloride flush  3 mL Intravenous Q12H    Continuous Infusions:  sodium chloride Stopped (01/28/22 2301)   heparin 1,350 Units/hr (02/14/22 1947)   TPN ADULT (ION) 85 mL/hr at 02/14/22 1833    PRN Meds: sodium chloride, acetaminophen, alum & mag hydroxide-simeth, antiseptic oral rinse, diphenhydrAMINE **OR** diphenhydrAMINE, fentaNYL (SUBLIMAZE) injection, iohexol, menthol-cetylpyridinium, ondansetron (ZOFRAN) IV, oxyCODONE, phenol, prochlorperazine, simethicone, sodium chloride flush  Physical Exam         Appears chronically il But hemodynamically stable Resting in bed NG tube out No edema Awake alert S 1 S 2  ABD pad in place over abdominal wound.  Bloody discharge noted on bedding but does not appear to be actively bleeding  Vital Signs: BP 111/87 (BP Location: Left Wrist)    Pulse 80    Temp 98.5 F (36.9 C) (Oral)    Resp 20    Ht 5' 11"  (1.803 m)    Wt 69.8 kg    SpO2 94%    BMI 21.46 kg/m  SpO2: SpO2: 94 % O2 Device:  O2 Device: Room Air O2 Flow Rate: O2 Flow Rate (L/min): 2 L/min  Intake/output summary:  Intake/Output Summary (Last 24 hours) at 02/14/2022 2213 Last data filed at 02/14/2022 1839 Gross per 24 hour  Intake 1219.8 ml  Output 2730 ml  Net -1510.2 ml    LBM: Last BM Date : 02/13/22 Baseline Weight: Weight: 57 kg Most recent weight: Weight: 69.8 kg       Palliative Assessment/Data:      Patient Active Problem List   Diagnosis Date Noted   Leg DVT (deep venous thromboembolism), acute, left (HCC) 02/14/2022   Leg edema, left 02/13/2022   Empyema lung ([place])    Goals of care, counseling/discussion 02/10/2022   Empyema of left pleural space ([place]) 02/10/2022   E-coli UTI 02/01/2022   SBO (small bowel obstruction) ([place]) 01/28/2022   Subcutaneous nodule of abdominal wall 01/09/2022   Partial small bowel obstruction ([place]) 01/07/2022   COVID-19 virus infection 01/07/2022   Colostomy in place ([place]) 11/10/2021   Stage 3a chronic kidney disease (CKD) ([place]) 11/10/2021   Pyelonephritis    Essential hypertension 10/30/2021   Hydronephrosis of left kidney 10/28/2021   Ureteral stricture, left 10/27/2021   Protein-calorie malnutrition, severe 06/12/2021   Hydroureter on right 06/10/2021   Aortic atherosclerosis ([place]) 06/10/2021   Leukocytosis 06/02/2021   Right ureteral stone 06/02/2021   Colon stricture ([place])    Pancreatic mass 06/27/2019   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)M1  with metastatic disease 06/27/2019   Rhinitis medicamentosa 02/26/2019    Palliative Care Assessment & Plan   Patient Profile:    Assessment:  81 year old gentleman who lives at home with his wife in [place], [place].  His daughter lives nearby.  He has a past medical history of stage IV pancreatic cancer, status post colectomy, hydronephrosis status post nephrostomy, underlying hypertension small bowel obstruction stage II  chronic kidney disease.  Patient presented with ongoing  abdominal pain, known to have concerns for small bowel obstruction issues.  Underwent a diagnostic laparoscopy with lysis of additions on 2-14.  He is being followed by general surgery and medical oncology.  Has been placed on TPN for nutritional support.  Has been placed on NG tube for suction.  So far, patient has not not had any bowel movements or passed flatus. Palliative medicine team has been consulted for CODE STATUS and goals of care discussions and for additional support.  Recommendations/Plan: DNR Continue current mode of care.  He is invested in plan to continue with current interventions.  He is still hopeful that he will recover enough to receive systemic therapy for his pancreatic cancer.  He places a great deal of confidence in Dr. Antonieta Pert recommendations and appreciate his input greatly. Pain: He feels today that current regimen has been sufficient and requests to keep with it.    Code Status:    Code Status Orders  (From admission, onward)           Start     Ordered   02/06/22 1335  Do not attempt resuscitation (DNR)  Continuous       Question Answer Comment  In the event of cardiac or respiratory ARREST Do not call a code blue   In the event of cardiac or respiratory ARREST Do not perform Intubation, CPR, defibrillation or ACLS   In the event of cardiac or respiratory ARREST Use medication by any route, position, wound care, and other measures to relive pain and suffering. May use oxygen, suction and manual treatment of airway obstruction as needed for comfort.      02/06/22 1334           Code Status History     Date Active Date Inactive Code Status Order ID Comments User Context   01/29/2022 0014 02/06/2022 1334 Full Code [id number]  Marcelyn Bruins, MD Inpatient   01/07/2022 2129 01/10/2022 2258 Full Code [id number]  Rise Patience, MD Inpatient   12/10/2021 1331 12/11/2021 2137 Full Code [id number]  Jonnie Finner, DO ED   11/10/2021 0354 11/13/2021  2039 Full Code [id number]  Chotiner, Yevonne Aline, MD Inpatient   10/27/2021 2317 10/30/2021 1833 Full Code [id number]  Lenore Cordia, MD Inpatient   06/09/2021 2201 06/18/2021 1923 Full Code [id number]  Lenore Cordia, MD Inpatient   06/01/2021 2102 06/05/2021 1830 Full Code [id number]  Rhetta Mura, DO ED   05/18/2021 1200 05/26/2021 1958 Partial Code [id number]  Jonnie Finner, DO Inpatient   12/12/2014 1730 12/17/2014 1704 Full Code [id number]  Velvet Bathe, MD Inpatient       Prognosis:  Guarded   Discharge Planning: To Be Determined  Care plan was discussed with patient.  Total time: 50 minutes  Thank you for allowing the Palliative Medicine Team to assist in the care of this patient. Micheline Rough, MD  Please contact Palliative Medicine Team phone at [phone] for questions and concerns.

## 2022-02-14 NOTE — Progress Notes (Signed)
Left lower extremity venous duplex completed. Refer to "CV Proc" under chart review to view preliminary results.  Preliminary results discussed with Dr. Lupita Leash.  02/14/2022 9:20 AM Kelby Aline., MHA, RVT, RDCS, RDMS

## 2022-02-14 NOTE — Progress Notes (Signed)
Nutrition Follow-up  DOCUMENTATION CODES:   Severe malnutrition in context of chronic illness, Underweight  INTERVENTION:  - continue orders for Boost Breeze TID and Ensure BID. - TPN per Pharmacist.  - diet re-advancement as medically feasible.    NUTRITION DIAGNOSIS:   Severe Malnutrition related to chronic illness, cancer and cancer related treatments as evidenced by severe fat depletion, severe muscle depletion, percent weight loss. -ongoing  GOAL:   Patient will meet greater than or equal to 90% of their needs -met with TPN and minimal PO intakes  MONITOR:   PO intake, Supplement acceptance, Diet advancement, Labs, Weight trends, Other (Comment) (TPN regimen)  REASON FOR ASSESSMENT:   Consult Calorie Count  ASSESSMENT:   81 y.o. male with medical history of stage 4 pancreatic cancer, hydronephrosis s/p nephrostomy, HTN, SBO s/p colectomy, and stage 3 CKD. He presented to the ED due to ongoing abdominal pain x2-3 days with pain being 8/10. He also reported N/V. He was having some ostomy output. He was found to have recurrent SBO and General Surgery was consulted.  Calorie Count started on 2/23. Patient transferred from [place] to 4W on 2/26 afternoon and Calorie Count envelope was not transported with him.  Diet downgraded from Soft to CLD today at 0930 d/t increased nausea, abdominal pain, and abdominal distention. The only documented meal completion percentages since 2/23 was 0% of breakfast and 0% of lunch yesterday on Soft diet.   Able to talk with Surgery PA on the phone prior to visit to patient's room. Able to talk with patient as he was laying in bed, no visitors present at that time.  Patient confirms above listed symptoms. He has Boost Breeze (accepting 75-90% of the time offered) and Ensure Plus High Protein (accepting 50% of the time offered) ordered. He would like to continue orders for both supplements.    Visit brief as 2 RNs were at bedside providing  care.  Weight has been fluctuating throughout hospitalization. Mild pitting edema to BLE documented in the edema section of flow sheet.  He has implanted R chest port from 10/28/21. He is receiving custom TPN @ 85 ml/hr which is providing 2061 kcal and 112 grams protein to meet 100% estimated needs.   Labs reviewed; CBGs: 141 and 137 mg/dl, BUN: 46 mg/dl, Ca: 7.9 mg/dl, Alk Phos elevated, LFTs elevated but trending down, triglycerides: 130 mg/dl today.  Medications reviewed; sliding scale novolog, 1 tablet multivitamin with minerals/day.   Diet Order:   Diet Order             Diet clear liquid Room service appropriate? Yes; Fluid consistency: Thin  Diet effective now                   EDUCATION NEEDS:   Education needs have been addressed  Skin:  Skin Assessment: Skin Integrity Issues: Skin Integrity Issues:: Incisions Incisions: 2/14 -abdomen  Last BM:  2/26 (100 ml via colostomy)  Height:   Ht Readings from Last 1 Encounters:  01/28/22 5' 11"  (1.803 m)    Weight:   Wt Readings from Last 1 Encounters:  02/14/22 69.8 kg     BMI:  Body mass index is 21.46 kg/m.  Estimated Nutritional Needs:  Kcal:  2050-2250 kcal Protein:  105-120 grams Fluid:  >/= 2 L/day     Kevin Matin, MS, RD, LDN Inpatient Clinical Dietitian RD pager # available in [place]  After hours/weekend pager # available in [facility]

## 2022-02-14 NOTE — Progress Notes (Signed)
ANTICOAGULATION CONSULT NOTE - Initial Consult  Pharmacy Consult for IV heparin Indication: DVT  Allergies  Allergen Reactions   Pollen Extract Other (See Comments)    Sinus congestion    Patient Measurements: Height: 5\' 11"  (180.3 cm) Weight: 69.8 kg (153 lb 14.1 oz) IBW/kg (Calculated) : 75.3 Heparin Dosing Weight: actual body weight   Vital Signs: Temp: 97.5 F (36.4 C) (02/27 0407) Temp Source: Oral (02/27 0407) BP: 149/90 (02/27 0407) Pulse Rate: 72 (02/27 0407)  Labs: Recent Labs    02/12/22 0551 02/12/22 1039 02/12/22 1039 02/13/22 0623 02/14/22 0713  HGB  --  10.1*   < > 10.4* 10.0*  HCT  --  33.1*  --  33.7* 32.3*  PLT  --  866*  --  1,086* 1,049*  CREATININE 1.19  --   --  1.24 1.15   < > = values in this interval not displayed.    Estimated Creatinine Clearance: 50.6 mL/min (by C-G formula based on SCr of 1.15 mg/dL).   Medical History: Past Medical History:  Diagnosis Date   Colostomy present [facility])    Large bowel obstruction ([place])    Pancreatic cancer [facility])     Assessment: 81 y/oM with PMH of metastatic pancreatic cancer and history of SBO presented to [facility] ED for abdominal pain, nausea, vomiting found to have partial SBO s/p laparoscopic lysis of adhesions, small bowel resection, excisional bipsy of mesentery mass on 2/14. Underwent left thoracentesis for pleural effusion 2/23, chest tube placed 2/24, intrapleural lytics given 2/25. Today, left lower extremity venous duplex + for LLE DVT. Pharmacy consulted for IV heparin dosing. Hgb low but stable at 10, Pltc elevated at 1049K. Note patient has been ordered Enoxaparin 40mg  SQ q24h for VTE prophylaxis throughout this admission, but patient refused doses 2/24-2/26 (last dose administered 2/23 at 1004).   Goal of Therapy:  Heparin level 0.3-0.7 units/ml Monitor platelets by anticoagulation protocol: Yes   Plan:  Discontinue SQ enoxaparin Add on aPTT, PT/INR Heparin 2250 units IV bolus x 1, then  start heparin infusion at 1200 units/hr Heparin level 8 hours after initiation Daily CBC, heparin level Monitor closely for s/sx of bleeding   Lindell Spar, PharmD, BCPS Clinical Pharmacist 02/14/2022,9:37 AM

## 2022-02-14 NOTE — Assessment & Plan Note (Addendum)
Duplex of the left leg + for acute DVT in left mid and distal femoral vein, posterior tibial vein and peroneal vein. Started heparin 2/27. Transition to comfort care.

## 2022-02-14 NOTE — Progress Notes (Signed)
PT Cancellation Note  Patient Details Name: Kevin Villegas MRN: [id number] DOB: Jun 10, 1941   Cancelled Treatment:    Reason Eval/Treat Not Completed: Medical issues which prohibited therapy (Pt with new acute DVT in Lt LE and platelets >1000K/uL. Will hold until pt has been on heparin for 24 hours.)   Verner Mould, DPT [place] Office [phone] Pager [phone]

## 2022-02-14 NOTE — Progress Notes (Signed)
Patient ID: Kevin Villegas, male   DOB: 08-20-1941, 81 y.o.   MRN: [id number] [facility] ([facility] Surgery Progress Note  13 Days Post-Op  Subjective: CC-  Complaining of persistent abdominal pain, distension, and n/v. Did not eat anything yesterday. Colostomy with some output.  Objective: Vital signs in last 24 hours: Temp:  [97.5 F (36.4 C)-98.7 F (37.1 C)] 97.5 F (36.4 C) (02/27 0407) Pulse Rate:  [65-84] 72 (02/27 0407) Resp:  [16-20] 16 (02/27 0407) BP: (121-149)/(81-91) 149/90 (02/27 0407) SpO2:  [91 %-98 %] 97 % (02/27 0407) Weight:  [69.8 kg] 69.8 kg (02/27 0500) Last BM Date : 02/12/22  Intake/Output from previous day: 02/26 0701 - 02/27 0700 In: 1911.3 [P.O.:180; I.V.:1131.3; IV Piggyback:600] Out: 2730 [Urine:1950; Stool:100; Chest Tube:680] Intake/Output this shift: No intake/output data recorded.  PE: Gen:  Alert, uncomfortable Abd: distended but overall soft, mostly right sided abdominal tenderness, ostomy viable with some soft brown stool in pouch. Suprapubic port site open and draining puss with cellulitis  Lab Results:  Recent Labs    02/13/22 0623 02/14/22 0713  WBC 24.3* 18.4*  HGB 10.4* 10.0*  HCT 33.7* 32.3*  PLT 1,086* 1,049*   BMET Recent Labs    02/13/22 0623 02/14/22 0713  NA 142 139  K 4.5 4.0  CL 109 109  CO2 26 26  GLUCOSE 113* 160*  BUN 52* 46*  CREATININE 1.24 1.15  CALCIUM 8.5* 7.9*   PT/INR No results for input(s): LABPROT, INR in the last 72 hours. CMP     Component Value Date/Time   NA 139 02/14/2022 0713   NA 139 12/31/2021 1127   K 4.0 02/14/2022 0713   CL 109 02/14/2022 0713   CO2 26 02/14/2022 0713   GLUCOSE 160 (H) 02/14/2022 0713   BUN 46 (H) 02/14/2022 0713   BUN 38 (H) 12/31/2021 1127   CREATININE 1.15 02/14/2022 0713   CREATININE 1.58 (H) 11/18/2021 1149   CALCIUM 7.9 (L) 02/14/2022 0713   PROT 5.3 (L) 02/14/2022 0713   PROT 7.2 12/31/2021 1127   ALBUMIN <1.5 (L) 02/14/2022 0713   ALBUMIN 3.9  12/31/2021 1127   AST 70 (H) 02/14/2022 0713   AST 44 (H) 11/18/2021 1149   ALT 60 (H) 02/14/2022 0713   ALT 45 (H) 11/18/2021 1149   ALKPHOS 210 (H) 02/14/2022 0713   BILITOT 0.3 02/14/2022 0713   BILITOT 0.3 12/31/2021 1127   BILITOT 0.3 11/18/2021 1149   GFRNONAA >60 02/14/2022 0713   GFRNONAA 44 (L) 11/18/2021 1149   GFRAA >60 11/24/2019 1600   Lipase     Component Value Date/Time   LIPASE 42 01/28/2022 1500       Studies/Results: CT ABDOMEN PELVIS WO CONTRAST  Result Date: 02/12/2022 CLINICAL DATA:  Abdominal pain, pancreatic carcinoma EXAM: CT ABDOMEN AND PELVIS WITHOUT CONTRAST TECHNIQUE: Multidetector CT imaging of the abdomen and pelvis was performed following the standard protocol without IV contrast. RADIATION DOSE REDUCTION: This exam was performed according to the departmental dose-optimization program which includes automated exposure control, adjustment of the mA and/or kV according to patient size and/or use of iterative reconstruction technique. COMPARISON:  02/07/2022 FINDINGS: Lower chest: There is interval placement of left chest tube with a decrease in the amount of left pleural effusion. There is residual moderate bilateral pleural effusions. Atelectatic changes are noted in the lower lung fields, more so on the left side. There are multiple noncalcified nodules of varying sizes in both lower lung fields suggesting pulmonary metastatic disease. Hepatobiliary: There are  few low-density lesions in the liver largest measuring 2.3 cm in the right lobe. There is no significant interval change. Density measurements suggest possible cysts. Surgical clips are seen in gallbladder fossa. Pancreas: No focal abnormality is seen. Spleen: Not seen Adrenals/Urinary Tract: There is mild nodularity in the adrenals. There is marked right hydronephrosis. Right ureter is dilated. There is percutaneous left nephrostomy tube ureteral stent. There is no hydronephrosis in the left kidney.  There is 3.1 cm cyst in the upper pole of right kidney. Right kidney is smaller than left with cortical atrophy. Stomach/Bowel: Stomach is not distended. There is stent in the distal duodenum. There is dilation of small-bowel loops measuring up to 5.5 cm in maximum diameter. Appendix is not distinctly seen. Scattered diverticula are seen in colon without signs of focal diverticulitis. There is liquid stool in the rectum. Colostomy is seen in left paramedian location slightly above the level of umbilicus. Vascular/Lymphatic: Scattered arterial calcifications are seen. Reproductive: Unremarkable. Other: There is diffuse edema in subcutaneous plane suggesting anasarca. There is 2.7 cm soft tissue nodule in the right anterior abdominal wall with no significant change. There is no pneumoperitoneum. Small to moderate ascites is present. Musculoskeletal: Degenerative changes are noted in the lumbar spine with spinal stenosis and encroachment of neural foramina at multiple levels. IMPRESSION: There is decrease in left pleural effusion after placement of left chest tube. Moderate bilateral pleural effusions are present. There are multiple noncalcified nodules of varying sizes in the lower lung fields consistent with pulmonary metastatic disease. There is dilation of small-bowel loops with interval worsening suggesting ileus or partial obstruction. Diverticulosis of colon without signs of diverticulitis. Severe chronic dilation of collecting system and ureter on the right side with no significant change. There is percutaneous nephrostomy and ureteral stent on the left side. Small to moderate ascites. Other findings as described in the body of the report. Electronically Signed   By: Elmer Picker M.D.   On: 02/12/2022 14:01   DG CHEST PORT 1 VIEW  Result Date: 02/13/2022 CLINICAL DATA:  Empyema lung EXAM: PORTABLE CHEST 1 VIEW COMPARISON:  02/11/2022 FINDINGS: Left basilar chest tube. Right chest wall port with  catheter tip unchanged. Bilateral pulmonary opacities with nodularity are again identified. Overall aeration is slightly worse. Increased lucency at the left costophrenic angle suggesting greater pleural air conjunction with residual effusion. Similar cardiomediastinal contours. IMPRESSION: Bilateral pulmonary opacities with nodularity. Aeration is slightly worse. Increased lucency at the left costophrenic angle likely represents greater pneumothorax component of a hydropneumothorax. Electronically Signed   By: Macy Mis M.D.   On: 02/13/2022 11:39   VAS [place] LOWER EXTREMITY VENOUS (DVT)  Result Date: 02/14/2022  Lower Venous DVT Study Patient Name:  CONG HIGHTOWER  Date of Exam:   02/14/2022 Medical Rec #: [id number]       Accession #:    [id number] Date of Birth: [birth year]       Patient Gender: M Patient Age:   48 years Exam Location:  [facility] Procedure:      VAS [place] LOWER EXTREMITY VENOUS (DVT) Referring Phys: [place] --------------------------------------------------------------------------------  Indications: Edema.  Comparison Study: No prior study Performing Technologist: Maudry Mayhew MHA, RDMS, RVT, RDCS  Examination Guidelines: A complete evaluation includes B-mode imaging, spectral Doppler, color Doppler, and power Doppler as needed of all accessible portions of each vessel. Bilateral testing is considered an integral part of a complete examination. Limited examinations for reoccurring indications may be performed as noted. The reflux portion of  the exam is performed with the patient in reverse Trendelenburg.  +-----+---------------+---------+-----------+----------+--------------+  RIGHT Compressibility Phasicity Spontaneity Properties Thrombus Aging  +-----+---------------+---------+-----------+----------+--------------+  CFV   Full            Yes       Yes                                    +-----+---------------+---------+-----------+----------+--------------+    +---------+---------------+---------+-----------+----------+-----------------+  LEFT      Compressibility Phasicity Spontaneity Properties Thrombus Aging     +---------+---------------+---------+-----------+----------+-----------------+  CFV       Full            No        Yes                                       +---------+---------------+---------+-----------+----------+-----------------+  SFJ       Full                                                                +---------+---------------+---------+-----------+----------+-----------------+  FV Prox   Full                                                                +---------+---------------+---------+-----------+----------+-----------------+  FV Mid    None                      No                     Acute              +---------+---------------+---------+-----------+----------+-----------------+  FV Distal None                      No                     Acute              +---------+---------------+---------+-----------+----------+-----------------+  PFV       Full                                                                +---------+---------------+---------+-----------+----------+-----------------+  POP       Full            No        Yes                                       +---------+---------------+---------+-----------+----------+-----------------+  PTV       None  Yes       Yes                    Age Indeterminate  +---------+---------------+---------+-----------+----------+-----------------+  PERO      None                      No                     Acute              +---------+---------------+---------+-----------+----------+-----------------+     Summary: RIGHT: - No evidence of common femoral vein obstruction.  LEFT: - Findings consistent with acute deep vein thrombosis involving the left femoral vein, and left peroneal veins. - Findings consistent with age indeterminate deep vein thrombosis involving the left peroneal veins.   *See table(s) above for measurements and observations.    Preliminary     Anti-infectives: Anti-infectives (From admission, onward)    Start     Dose/Rate Route Frequency Ordered Stop   02/11/22 1330  linezolid (ZYVOX) IVPB 600 mg        600 mg 300 mL/hr over 60 Minutes Intravenous Every 12 hours 02/11/22 1234     02/04/22 1545  piperacillin-tazobactam (ZOSYN) IVPB 3.375 g        3.375 g 12.5 mL/hr over 240 Minutes Intravenous Every 8 hours 02/04/22 1453 02/09/22 1359   02/01/22 2200  ceFAZolin (ANCEF) IVPB 1 g/50 mL premix  Status:  Discontinued        1 g 100 mL/hr over 30 Minutes Intravenous Every 8 hours 02/01/22 1504 02/04/22 1453   02/01/22 0930  cefoTEtan (CEFOTAN) 2 g in sodium chloride 0.9 % 100 mL IVPB        2 g 200 mL/hr over 30 Minutes Intravenous On call to O.R. 01/31/22 1908 02/01/22 1533   01/30/22 1000  cefTRIAXone (ROCEPHIN) 1 g in sodium chloride 0.9 % 100 mL IVPB  Status:  Discontinued        1 g 200 mL/hr over 30 Minutes Intravenous Every 24 hours 01/29/22 0013 02/01/22 1504   01/28/22 1830  cefTRIAXone (ROCEPHIN) 1 g in sodium chloride 0.9 % 100 mL IVPB        1 g 200 mL/hr over 30 Minutes Intravenous  Once 01/28/22 1828 01/28/22 1927        Assessment/Plan Stage IV pancreatic cancer with psbo  - POD#13 s/p Lap LOA, Small bowel resection, excisional biopsy of mesentery mass by Dr. [name] 2/14 - operative findings of oligometastatic disease acting as a transition point in LLQ  - pathology with adenocarcinoma with mucinous features of left colon mesentery and jejunum - CT scan 2/25 without intra abdominal abscess, small abdominal wall abscess; ileus vs partial obstruction. Decrease in left pleural effusion after chest tube placement - WBC trending down, 18.4 from 24 - ID consulted and oncology following. Likely from empyema and abdominal wall abscess. Continue packing suprapubic port site - worsening n/v. Back diet down to CLD. Continue TPN   FEN: TPN,  CLD ID: cefotetan periop, ancef (UTI), zosyn 2/17>2/21. Linezolid 2/24>> VTE: lovenox   Left empyema s/p chest tube E coli UTI - completed abx CKD 3A HTN Code status DNR - palliative following Severe protein calorie malnutrition   LOS: 16 days    Wellington Hampshire, [facility] 02/14/2022, 9:22 AM Please see Amion for pager number during day hours 7:00am-4:30pm

## 2022-02-14 NOTE — Progress Notes (Signed)
ANTICOAGULATION CONSULT NOTE - Initial Consult  Pharmacy Consult for IV heparin Indication: DVT  Allergies  Allergen Reactions   Pollen Extract Other (See Comments)    Sinus congestion    Patient Measurements: Height: 5\' 11"  (180.3 cm) Weight: 69.8 kg (153 lb 14.1 oz) IBW/kg (Calculated) : 75.3 Heparin Dosing Weight: actual body weight   Vital Signs: Temp: 98.1 F (36.7 C) (02/27 1302) Temp Source: Oral (02/27 1302) BP: 133/95 (02/27 1302) Pulse Rate: 80 (02/27 1302)  Labs: Recent Labs    02/12/22 0551 02/12/22 1039 02/12/22 1039 02/13/22 0623 02/14/22 0713 02/14/22 0950  HGB  --  10.1*   < > 10.4* 10.0*  --   HCT  --  33.1*  --  33.7* 32.3*  --   PLT  --  866*  --  1,086* 1,049*  --   APTT  --   --   --   --   --  30  LABPROT  --   --   --   --   --  14.9  INR  --   --   --   --   --  1.2  CREATININE 1.19  --   --  1.24 1.15  --    < > = values in this interval not displayed.     Estimated Creatinine Clearance: 50.6 mL/min (by C-G formula based on SCr of 1.15 mg/dL).   Medical History: Past Medical History:  Diagnosis Date   Colostomy present [facility])    Large bowel obstruction ([place])    Pancreatic cancer [facility])     Assessment: 73 y/oM with PMH of metastatic pancreatic cancer and history of SBO presented to [facility] ED for abdominal pain, nausea, vomiting found to have partial SBO s/p laparoscopic lysis of adhesions, small bowel resection, excisional bipsy of mesentery mass on 2/14. Underwent left thoracentesis for pleural effusion 2/23, chest tube placed 2/24, intrapleural lytics given 2/25. Today, left lower extremity venous duplex + for LLE DVT. Pharmacy consulted for IV heparin dosing. Hgb low but stable at 10, Pltc elevated at 1049K. Note patient has been ordered Enoxaparin 40mg  SQ q24h for VTE prophylaxis throughout this admission, but patient refused doses 2/24-2/26 (last dose administered 2/23 at 1004).   Today, 02/14/22 - Heparin level <0.10 on heparin 1200  units/hr - Discussed with RN - no issues with heparin drip infusing and no bleeding reported  Goal of Therapy:  Heparin level 0.3-0.7 units/ml Monitor platelets by anticoagulation protocol: Yes   Plan:  Heparin bolus 2000 units x1 Increase heparin drip to 1350 units/hr Heparin level 8 hours after increase Daily CBC, heparin level Monitor closely for s/sx of bleeding   Dimple Nanas, PharmD 02/14/2022 6:53 PM

## 2022-02-14 NOTE — Progress Notes (Signed)
NAME:  Kevin Villegas, MRN:  [id number], DOB:  [birth year], LOS: 70 ADMISSION DATE:  01/28/2022, CONSULTATION DATE:  02/11/2022 REFERRING MD:  Dr. Antonieta Pert, CHIEF COMPLAINT:  Pleural effusion   History of Present Illness:  Kevin Villegas is a 81 year old unfortunate man with metastatic pancreatic cancer.  He was admitted 2/9 for recurrent small bowel obstruction and on 2/14 underwent lysis of adhesions, small bowel resection and excision biopsy of mesenteric mass by Dr. 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 He was  treated for E. coli UTI   He also has a history of left hydronephrosis status post nephrostomy tube with stent, chronic right renal atrophy and hydroureteronephrosis   Review of his serial imaging dating back to November of last year shows metastatic lung nodules with left pleural disease and a small to moderate size left effusion. He had persistent leukocytosis and underwent left thoracentesis 2/23 , only 50 cc of thick right purulent fluid was removed. Pleural fluid results shows 25K cells with 100% neutrophils, culture showing Enterococcus.  Pertinent  Medical History  Metastatic pancreatic cancer, large bowel obstruction resulting in colectomy and colostomy  Significant Hospital Events: Including procedures, antibiotic start and stop dates in addition to other pertinent events   2/10 admitted  2/14 Underwent lysis of adhesions and laparoscopic small bowel resection with Dr. [name] 2/23 underwent left thoracentesis with 67ml fluid removed 2/24 Pulmonary consulted for management of loculated pleural effusion , pigtail placed 2/25 lytics #1 2/26 left ex-vacuo pneumothorax  Images  1.No evidence of postoperative intra-abdominal abscess. Moderate right upper quadrant free fluid and small amount of free fluid in the pelvis is non organized. 2. Enteric tube terminates at the gastroesophageal junction. 3. Left nephrostomy and left  nephroureteral stent in place without left hydronephrosis. Chronic right renal atrophy and hydroureteronephrosis. 4. Pulmonary metastatic disease in the lung bases with multiple pulmonary nodules. Enlarging left pleural effusion with pleural thickening, possibly metastatic. Moderate-sized right pleural effusion is new. 5. Diffuse body wall edema. 6. Soft tissue nodule in the right anterior abdominal wall is unchanged, possibly metastatic. 7. Additional chronic findings are unchanged.  Interim History / Subjective:   Transferred to a different floor for chest tube management Saturation 97% on room air. Denies chest pain or dyspnea Chest tube output 650 cc last 24 hours, new Pleur-evac  Objective   Blood pressure (!) 149/90, pulse 72, temperature (!) 97.5 F (36.4 C), temperature source Oral, resp. rate 16, height 5\' 11"  (1.803 m), weight 69.8 kg, SpO2 97 %.        Intake/Output Summary (Last 24 hours) at 02/14/2022 1157 Last data filed at 02/14/2022 1100 Gross per 24 hour  Intake 1921.29 ml  Output 2880 ml  Net -958.71 ml    Filed Weights   02/12/22 0505 02/13/22 0518 02/14/22 0500  Weight: 72.1 kg 69.7 kg 69.8 kg    Examination: General: chronically ill appearing deconditioned frail elderly male lying in bed in NAD HEENT: [place]/AT, MM pink/moist, PERRL,  Neuro: Nonfocal, alert oriented x3, lethargic CV: s1s2 regular rate and rhythm, no murmur, rubs, or gallops,  PULM: Good respiratory variation in Pleur-evac, no accessory muscle use, decreased breath sounds on left GI: soft, bowel sounds active in all 4 quadrants, non-tender, slightly distended, left nephrostomy tube  Extremities: warm/dry, no edema  Skin: no rashes or lesions  Chest x-ray 2/28 independently reviewed shows ex-vacuo pneumothorax is persistent, decreased diffusion  Resolved Hospital Problem list     Assessment & Plan:  Left empyema -with Enterococcus likely seeding previously known loculated left  effusion, likely malignant -CT ABD pelvis revealed pulmonary metastatic disease in the lung bases with multiple pulmonary nodules. Enlarging left pleural effusion with pleural thickening, possibly metastatic and moderate-sized right pleural effusion -IR preformed left thoracentesis 2/23 with 50 ml of light red/purulent/thick fluid removed  P:  Continue Zyvox Ex-vacuo pneumothorax likely represents trapped lung due to malignant effusion. Await pleural fluid cytology Do not think more lytics will help here , we may have reached maximum benefit with chest tube  -will continue chest tube to suction and once drainage decreases can discontinue chest tube  Acute LLE DVT -on IV heparin   Best Practice (right click and "Reselect all SmartList Selections" daily)  Per primary   Labs   CBC: Recent Labs  Lab 02/10/22 0812 02/11/22 0607 02/12/22 1039 02/13/22 0623 02/14/22 0713  WBC 38.6* 35.5* 31.2* 24.3* 18.4*  NEUTROABS  --   --   --  20.2* 15.1*  HGB 10.7* 10.8* 10.1* 10.4* 10.0*  HCT 33.7* 35.2* 33.1* 33.7* 32.3*  MCV 95.5 97.5 99.1 96.8 97.0  PLT 771* 854* 866* 1,086* 1,049*     Basic Metabolic Panel: Recent Labs  Lab 02/09/22 0610 02/10/22 0812 02/12/22 0551 02/13/22 0623 02/14/22 0713  NA 140 143   142 141 142 139  K 3.6 4.0   4.0 3.8 4.5 4.0  CL 107 112*   112* 114* 109 109  CO2 25 24   23 23 26 26   GLUCOSE 173* 160*   160* 177* 113* 160*  BUN 50* 51*   50* 55* 52* 46*  CREATININE 1.50* 1.23   1.20 1.19 1.24 1.15  CALCIUM 8.3* 8.3*   8.2* 8.2* 8.5* 7.9*  MG 2.3 2.3 2.2  --  2.0  PHOS 4.0 3.6  --   --  3.6    GFR: Estimated Creatinine Clearance: 50.6 mL/min (by C-G formula based on SCr of 1.15 mg/dL). Recent Labs  Lab 02/10/22 0812 02/11/22 0607 02/12/22 0551 02/12/22 1039 02/13/22 0623 02/14/22 0713  PROCALCITON 1.88 1.41 1.37  --   --   --   WBC 38.6* 35.5*  --  31.2* 24.3* 18.4*     Liver Function Tests: Recent Labs  Lab 02/08/22 0606  02/09/22 0610 02/10/22 0812 02/13/22 0623 02/14/22 0713  AST 142* 108* 102* 83* 70*  ALT 114* 94* 91* 65* 60*  ALKPHOS 245* 207* 204* 224* 210*  BILITOT 0.6 0.8 0.7 0.3 0.3  PROT 5.4* 5.4* 5.8* 5.7* 5.3*  ALBUMIN 1.6* 1.5* 1.6* <1.5* <1.5*    No results for input(s): LIPASE, AMYLASE in the last 168 hours. No results for input(s): AMMONIA in the last 168 hours.  ABG    Component Value Date/Time   TCO2 28 12/10/2021 0036     Kara Mead MD. FCCP. [facility] Pager : [phone]  If no response to pager , please call [phone] until 7 pm After 7:00 pm call Elink  [phone]    02/14/2022, 11:57 AM

## 2022-02-15 ENCOUNTER — Other Ambulatory Visit (HOSPITAL_COMMUNITY): Payer: Self-pay

## 2022-02-15 DIAGNOSIS — J869 Pyothorax without fistula: Secondary | ICD-10-CM | POA: Diagnosis not present

## 2022-02-15 DIAGNOSIS — Z7189 Other specified counseling: Secondary | ICD-10-CM | POA: Diagnosis not present

## 2022-02-15 DIAGNOSIS — E43 Unspecified severe protein-calorie malnutrition: Secondary | ICD-10-CM

## 2022-02-15 DIAGNOSIS — D75839 Thrombocytosis, unspecified: Secondary | ICD-10-CM

## 2022-02-15 DIAGNOSIS — G9341 Metabolic encephalopathy: Secondary | ICD-10-CM

## 2022-02-15 DIAGNOSIS — K56600 Partial intestinal obstruction, unspecified as to cause: Secondary | ICD-10-CM | POA: Diagnosis not present

## 2022-02-15 DIAGNOSIS — L8990 Pressure ulcer of unspecified site, unspecified stage: Secondary | ICD-10-CM | POA: Insufficient documentation

## 2022-02-15 LAB — CBC WITH DIFFERENTIAL/PLATELET
Abs Immature Granulocytes: 0.63 10*3/uL — ABNORMAL HIGH (ref 0.00–0.07)
Basophils Absolute: 0.1 10*3/uL (ref 0.0–0.1)
Basophils Relative: 1 %
Eosinophils Absolute: 0.6 10*3/uL — ABNORMAL HIGH (ref 0.0–0.5)
Eosinophils Relative: 3 %
HCT: 33.2 % — ABNORMAL LOW (ref 39.0–52.0)
Hemoglobin: 10.3 g/dL — ABNORMAL LOW (ref 13.0–17.0)
Immature Granulocytes: 3 %
Lymphocytes Relative: 10 %
Lymphs Abs: 2 10*3/uL (ref 0.7–4.0)
MCH: 29.8 pg (ref 26.0–34.0)
MCHC: 31 g/dL (ref 30.0–36.0)
MCV: 96 fL (ref 80.0–100.0)
Monocytes Absolute: 1.6 10*3/uL — ABNORMAL HIGH (ref 0.1–1.0)
Monocytes Relative: 8 %
Neutro Abs: 15.3 10*3/uL — ABNORMAL HIGH (ref 1.7–7.7)
Neutrophils Relative %: 75 %
Platelets: 1066 10*3/uL (ref 150–400)
RBC: 3.46 MIL/uL — ABNORMAL LOW (ref 4.22–5.81)
RDW: 18 % — ABNORMAL HIGH (ref 11.5–15.5)
WBC: 20.2 10*3/uL — ABNORMAL HIGH (ref 4.0–10.5)
nRBC: 0.1 % (ref 0.0–0.2)

## 2022-02-15 LAB — PATHOLOGIST SMEAR REVIEW

## 2022-02-15 LAB — COMPREHENSIVE METABOLIC PANEL
ALT: 53 U/L — ABNORMAL HIGH (ref 0–44)
AST: 60 U/L — ABNORMAL HIGH (ref 15–41)
Albumin: 1.6 g/dL — ABNORMAL LOW (ref 3.5–5.0)
Alkaline Phosphatase: 228 U/L — ABNORMAL HIGH (ref 38–126)
Anion gap: 6 (ref 5–15)
BUN: 48 mg/dL — ABNORMAL HIGH (ref 8–23)
CO2: 28 mmol/L (ref 22–32)
Calcium: 8 mg/dL — ABNORMAL LOW (ref 8.9–10.3)
Chloride: 107 mmol/L (ref 98–111)
Creatinine, Ser: 1.25 mg/dL — ABNORMAL HIGH (ref 0.61–1.24)
GFR, Estimated: 58 mL/min — ABNORMAL LOW (ref >60)
Glucose, Bld: 150 mg/dL — ABNORMAL HIGH (ref 70–99)
Potassium: 4.3 mmol/L (ref 3.5–5.1)
Sodium: 141 mmol/L (ref 135–145)
Total Bilirubin: 0.5 mg/dL (ref 0.3–1.2)
Total Protein: 5.7 g/dL — ABNORMAL LOW (ref 6.5–8.1)

## 2022-02-15 LAB — IRON AND TIBC
Iron: 32 ug/dL — ABNORMAL LOW (ref 45–182)
Saturation Ratios: 17 % — ABNORMAL LOW (ref 17.9–39.5)
TIBC: 186 ug/dL — ABNORMAL LOW (ref 250–450)
UIBC: 154 ug/dL

## 2022-02-15 LAB — MAGNESIUM: Magnesium: 2.1 mg/dL (ref 1.7–2.4)

## 2022-02-15 LAB — HEPARIN LEVEL (UNFRACTIONATED)
Heparin Unfractionated: 0.15 IU/mL — ABNORMAL LOW (ref 0.30–0.70)
Heparin Unfractionated: 0.13 IU/mL — ABNORMAL LOW (ref 0.30–0.70)
Heparin Unfractionated: 0.53 IU/mL (ref 0.30–0.70)

## 2022-02-15 LAB — GLUCOSE, CAPILLARY
Glucose-Capillary: 103 mg/dL — ABNORMAL HIGH (ref 70–99)
Glucose-Capillary: 107 mg/dL — ABNORMAL HIGH (ref 70–99)
Glucose-Capillary: 114 mg/dL — ABNORMAL HIGH (ref 70–99)
Glucose-Capillary: 142 mg/dL — ABNORMAL HIGH (ref 70–99)

## 2022-02-15 LAB — PREALBUMIN: Prealbumin: 11.6 mg/dL — ABNORMAL LOW (ref 18–38)

## 2022-02-15 LAB — PHOSPHORUS: Phosphorus: 3.5 mg/dL (ref 2.5–4.6)

## 2022-02-15 MED ORDER — HEPARIN (PORCINE) 25000 UT/250ML-% IV SOLN
1800.0000 [IU]/h | INTRAVENOUS | Status: DC
  Administered 2022-02-15 – 2022-02-16 (×4): 1800 [IU]/h via INTRAVENOUS
  Filled 2022-02-15 (×4): qty 250

## 2022-02-15 MED ORDER — HEPARIN BOLUS VIA INFUSION
2000.0000 [IU] | Freq: Once | INTRAVENOUS | Status: AC
  Administered 2022-02-15: 2000 [IU] via INTRAVENOUS
  Filled 2022-02-15: qty 2000

## 2022-02-15 MED ORDER — TRAVASOL 10 % IV SOLN
INTRAVENOUS | Status: AC
  Filled 2022-02-15: qty 1122

## 2022-02-15 MED ORDER — DRONABINOL 5 MG PO CAPS
5.0000 mg | ORAL_CAPSULE | Freq: Two times a day (BID) | ORAL | Status: DC
  Administered 2022-02-15 – 2022-02-18 (×5): 5 mg via ORAL
  Filled 2022-02-15 (×7): qty 1

## 2022-02-15 NOTE — Progress Notes (Signed)
[facility] for Infectious Disease  Date of Admission:  01/28/2022   Total days of inpatient antibiotics 12  Principal Problem:   Partial small bowel obstruction (HCC) Active Problems:   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  with metastatic disease   Leukocytosis   Protein-calorie malnutrition, severe   Hydronephrosis of left kidney   Essential hypertension   Colostomy in place [facility])   Stage 3a chronic kidney disease (CKD) ([place])   E-coli UTI   Goals of care, counseling/discussion   Empyema of left pleural space ([place])   Empyema lung ([place])   Leg DVT (deep venous thromboembolism), acute, left (HCC)   Pressure injury of skin   Thrombocytosis   Acute metabolic encephalopathy          Assessment: 81YM with metastatic pancreatic cancer admitted for recurrent small bowel obstruction underwent LAP on 2/14. Hospital course complicated by [facility] UTI(treated with pip-tazo). Pt had increased wbc despite antibiotics with enlarging pleural effusion.   #Left sided empyema with VRE SP chest tube placement on 2/24 #Pancreatic Cancer with metastatic disease with psbo #Left leg thrombus now on heparin -SP thoracentesis on 2/23 with Cx+ E faecium (VRE). Cytology did not show malignant cell -Palliative care following -TTE showed thickened aortic valve. As such will order TEE. 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 As such will wait on TEE to see if antibiotic regimen regimen needs to changed.  Recommendations: -Continue  linezolid to PO. Will likely need atleast 4 weeks of antibiotics(plan on switching to doxycyline after 2 weeks of linezolid to avoid  adverse effects) -Continue to monitor clinically -TEE Microbiology:   Antibiotics: Linezolid 2/24-p Pip-tazo 2/17-2/21 Cefazolin  2/14-2/17 Ceftriaxone 2/13 Cefotetan 2/14 Cultures: Blood 2/17 NG Other Thoracentesis on 2/23 VRE E facium  SUBJECTIVE: Resting bed. Family at bedside Interval: Afebrile overnight. Wbc 20.2K.  Review of Systems: Review of Systems  All other systems reviewed and are negative.   Scheduled Meds:  Chlorhexidine Gluconate Cloth  6 each Topical Daily   dronabinol  5 mg Oral BID AC   feeding supplement  1 Container Oral TID BM   feeding supplement  237 mL Oral BID BM   insulin aspart  0-15 Units Subcutaneous Q6H   lidocaine  1 patch Transdermal Q24H   linezolid  600 mg Oral Q12H   lip balm  1 application Topical BID   methocarbamol  500 mg Oral QID   OLANZapine  2.5 mg Oral QHS   sodium chloride flush  10 mL Other Q8H   sodium chloride flush  3 mL Intravenous Q12H   Continuous Infusions:  sodium chloride Stopped (01/28/22 2301)   heparin 1,800 Units/hr (02/15/22 1531)   TPN ADULT (ION) 85 mL/hr at 02/15/22 1531   TPN ADULT (ION)     PRN Meds:.sodium chloride, acetaminophen, alum & mag hydroxide-simeth, antiseptic oral rinse, diphenhydrAMINE **OR** diphenhydrAMINE, fentaNYL (SUBLIMAZE) injection, iohexol, menthol-cetylpyridinium, ondansetron (ZOFRAN) IV, oxyCODONE, phenol, prochlorperazine, simethicone, sodium chloride flush Allergies  Allergen Reactions   Pollen Extract Other (See Comments)    Sinus congestion    OBJECTIVE: Vitals:   02/14/22 2104 02/15/22 0500 02/15/22 0703 02/15/22 1142  BP: 111/87  130/85 (!) 145/93  Pulse: 80  76 81  Resp: 20  18 16   Temp: 98.5 F (36.9 C)  (!) 97.5 F (36.4 C) 98.1  F (36.7 C)  TempSrc: Oral  Oral Oral  SpO2: 94%  96% 98%  Weight:  69.9 kg    Height:       Body mass index is 21.49 kg/m.  Physical Exam Constitutional:      General: He is not in acute distress.    Appearance: He is normal weight. He is not toxic-appearing.  HENT:     Head: Normocephalic and atraumatic.     Right Ear: External ear normal.     Left Ear:  External ear normal.     Nose: No congestion or rhinorrhea.     Mouth/Throat:     Mouth: Mucous membranes are moist.     Pharynx: Oropharynx is clear.  Eyes:     Extraocular Movements: Extraocular movements intact.     Conjunctiva/sclera: Conjunctivae normal.     Pupils: Pupils are equal, round, and reactive to light.  Cardiovascular:     Rate and Rhythm: Normal rate and regular rhythm.     Heart sounds: No murmur heard.   No friction rub. No gallop.  Pulmonary:     Effort: Pulmonary effort is normal.     Breath sounds: Normal breath sounds.     Comments: Left chest tube in place Abdominal:     General: Abdomen is flat. Bowel sounds are normal.     Palpations: Abdomen is soft.  Musculoskeletal:        General: No swelling. Normal range of motion.     Cervical back: Normal range of motion and neck supple.  Skin:    General: Skin is warm and dry.  Neurological:     General: No focal deficit present.     Mental Status: He is oriented to person, place, and time.  Psychiatric:        Mood and Affect: Mood normal.      Lab Results Lab Results  Component Value Date   WBC 20.2 (H) 02/15/2022   HGB 10.3 (L) 02/15/2022   HCT 33.2 (L) 02/15/2022   MCV 96.0 02/15/2022   PLT 1,066 (HH) 02/15/2022    Lab Results  Component Value Date   CREATININE 1.25 (H) 02/15/2022   BUN 48 (H) 02/15/2022   NA 141 02/15/2022   K 4.3 02/15/2022   CL 107 02/15/2022   CO2 28 02/15/2022    Lab Results  Component Value Date   ALT 53 (H) 02/15/2022   AST 60 (H) 02/15/2022   ALKPHOS 228 (H) 02/15/2022   BILITOT 0.5 02/15/2022        Laurice Record, [facility] 02/15/2022, 4:03 PM

## 2022-02-15 NOTE — Progress Notes (Addendum)
[facility] for IV heparin Indication: DVT  Allergies  Allergen Reactions   Pollen Extract Other (See Comments)    Sinus congestion    Patient Measurements: Height: 5\' 11"  (180.3 cm) Weight: 69.9 kg (154 lb 1.6 oz) IBW/kg (Calculated) : 75.3 Heparin Dosing Weight: actual body weight   Vital Signs: Temp: 98.1 F (36.7 C) (02/28 1142) Temp Source: Oral (02/28 1142) BP: 145/93 (02/28 1142) Pulse Rate: 81 (02/28 1142)  Labs: Recent Labs    02/13/22 0623 02/14/22 0713 02/14/22 0950 02/14/22 1801 02/15/22 0357 02/15/22 1249  HGB 10.4* 10.0*  --   --  10.3*  --   HCT 33.7* 32.3*  --   --  33.2*  --   PLT 1,086* 1,049*  --   --  1,066*  --   APTT  --   --  30  --   --   --   LABPROT  --   --  14.9  --   --   --   INR  --   --  1.2  --   --   --   HEPARINUNFRC  --   --   --  <0.10* 0.15* 0.13*  CREATININE 1.24 1.15  --   --  1.25*  --      Estimated Creatinine Clearance: 46.6 mL/min (A) (by C-G formula based on SCr of 1.25 mg/dL (H)).   Medical History: Past Medical History:  Diagnosis Date   Colostomy present [facility])    Large bowel obstruction ([place])    Pancreatic cancer [facility])     Assessment: 73 y/oM with PMH of metastatic pancreatic cancer and history of SBO presented to [facility] ED for abdominal pain, nausea, vomiting found to have partial SBO s/p laparoscopic lysis of adhesions, small bowel resection, excisional bipsy of mesentery mass on 2/14. Underwent left thoracentesis for pleural effusion 2/23, chest tube placed 2/24, intrapleural lytics given 2/25. Today, left lower extremity venous duplex + for LLE DVT. Pharmacy consulted for IV heparin dosing. Hgb low but stable at 10, Pltc elevated at 1049K. Note patient has been ordered Enoxaparin 40mg  SQ q24h for VTE prophylaxis throughout this admission, but patient refused doses 2/24-2/26 (last dose administered 2/23 at 1004).   Today, 02/15/22 - Heparin level 0.13 units/mL, remains  subtherapeutic despite bolus and rate increase early this morning  - CBC: Hgb 10.3 (low, but stable), Pltc remains elevated at 1066K - Discussed with RN - no issues with heparin drip infusing and no bleeding reported  Goal of Therapy:  Heparin level 0.3-0.7 units/ml Monitor platelets by anticoagulation protocol: Yes   Plan:  Rebolus IV heparin 2000 units x 1 Increase heparin infusion to 1800 units/hr Heparin level 8 hours after rate increase Daily CBC, heparin level Monitor closely for s/sx of bleeding   Lindell Spar, PharmD, BCPS Clinical Pharmacist  02/15/2022 1:35 PM

## 2022-02-15 NOTE — Progress Notes (Signed)
PROGRESS NOTE Kevin Villegas  PXT:[id number] DOB: 1941/01/12 DOA: 01/28/2022 PCP: Dettinger, [name], MD   Brief Narrative/Hospital Course: 81 year old male with stage IV pancreatic cancer status post colectomy, hydronephrosis status post left nephrostomy, hypertension, SBO, CKD stage III who presented with abdominal pain, found to have small bowel obstruction status post diagnostic laparoscopy with lysis of adhesion on 2/40 measuring NG suction n.p.o. while waiting for return of bowel function.  Followed by surgery oncology.  Hospital course complicated by coffee-ground colored secretion from NG tube, leukocytosis. -Repeat CT scan 2/20 no bowel obstruction, no abscess moderate amount of free fluid in the right upper quadrant, body wall edema noted to the right anterior wall. With persistent leukocytosis but afebrile, being managed with TPN, IV antibiotics-.  Patient completed IV Zosyn 2/17-2/22.  Diet was slowly advanced NG tube removed at this time patient is tolerating diet having output in colostomy bag, having output in nephrostomy tube.  CT scan 2/21 no intra-abdominal abscess or obstruction but shows enlarging left pleural effusion.  Patient had persistent leukocytosis ID consulted, underwent left pleural diagnostic tap with 50 mL appears exudative fluid but Gram stain negative, total nucleated cells 25,000, neutrophils100 2/24:placed on Zyvox and underwent small bore chest tube placement by PCCM, lytics done 2/25. 2/26-chest tube placed to suction. Over the weekend -lower leg edema noted more on left-left duplex ordered came back positive for DVT and placed on heparin drip 2/27. Ongoing GOC discussion by palliative care-continue with current DNR and full scope of treatment.    Subjective: Seen and examined this morning.  Nursing reporting somewhat more confused not answering alert and orientation questions-was having pain had some relief with fentanyl this morning. To me he denied any new  complaints but still complaining ofongoing abdominal pain mostly in the right upper  abdomen. Denies shortness of breath.on RA chest tube with 5780 M, SOME stool in colostomy,Nephrostomy tube with  2liter  Assessment and Plan: * Partial small bowel obstruction ([place])- (present on admission) Stage IV pancreatic cancer with partial SBO s/p  Surgery CT 2/25 with small abscess in the abdominal wall Persistent abdominal pain distention nausea vomiting: S/p LOA, SBR,biopsy 2/14- positive for ACC with mucinous features of left colon mesentery and jejunum. Off NG tube 2/21, S/P Zosyn  2/17>>2/22. CT abdomen 2/25-small abscess in the abdominal wall-on wound care. Colostomy having output but having persistent abdominal pain distention and nausea vomiting-downgrading to clear liquid diet from soft 2/27, on TPN. Surgery following closely-who feels overall he is little worse.  Continue with supportive care.  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  with metastatic disease- (present on admission) See #1.Oncology on board.  Dr. Marin Olp following less likely will be candidate for systemic chemotherapy-oncology to discuss goals of care/prognosis with family I spoke with Dr. Marin Olp this morning -and he had seen the patient.    Empyema of left pleural space (HCC) S/P thick purulent drainage- 50 ml by IR 2/23- grew Enterococcus, likely seeding previously known loculated left effusion likely malignant. S/p small bore chest tube placement 2/24 Dr Leandra Kern a candidate for surgery. ID following and started on Zyvox 2/24 change to PO 2/27> plan to switch to Doxy after 2 weeks-needs at least 4 weeks of antibiotics. lytics done 2/25, and placed to suction 2/26-patient has a small left hydropneumothorax. Fluid cytology is negative. Continue chest tube suction and once drainage decreases can discontinue chest tube-PCCM managing  Leukocytosis Leukocytosis Thrombocytosis: Peaked to 38k-trended down to 18 K  but now uptrending, afebrile.  Also  has thrombocytosis which is most likely reactive.  On Zyvox.  Leukocytosis is  multifactorial due to patient's empyema and abdominal wall abscess. Recent Labs  Lab 02/11/22 0607 02/12/22 1039 02/13/22 0623 02/14/22 0713 02/15/22 0357  WBC 35.5* 31.2* 24.3* 18.4* 20.2*    E-coli UTI Completed antibiotics   Hydronephrosis of left kidney- (present on admission) Due to tumor compression, s/p left nephrostomy last changed on 1/30, renal ultrasound on 2/17 -chronic right hydronephrosis with atrophic right kidney, urology advised outpatient follow-up.  Nephrostomy draining well  Acute metabolic encephalopathy Patient having intermittent mild confusion, multifactorial with deconditioning multiple complex comorbidities.  He is nonfocal.  Communicative.  Continue supportive care fall precaution ambulate.  Thrombocytosis likely reactive.  See above  Leg DVT (deep venous thromboembolism), acute, left (HCC) Duplex of the left leg + for acute DVT in left mid and distal femoral vein, posterior tibial vein and peroneal vein.  This in the setting of his metastatic cancer, postop status immobility despite being on Lovenox prophylaxis. Started heparin 2/27.  Goals of care, counseling/discussion Patient with metastatic pancreatic cancer now with complicated course and hospitalization appears ill deconditioned with failure to thrive.Remains DNR.  Continue ongoing goals of care,ongoing pain management per palliative care Unable to advance diet further, continue ongoing goals of care discussion by palliative care, family hoping to hear overall prognosis goals of care plan from oncology.  Stage 3a chronic kidney disease (CKD) ([place])- (present on admission) AKI with creatinine at 1.59 improved to 1.1-1.2 with ivf, tpn.  Trend Recent Labs  Lab 02/09/22 0610 02/10/22 0812 02/12/22 0551 02/13/22 0623 02/14/22 0713  BUN 50* 51*   50* 55* 52* 46*  CREATININE 1.50* 1.23    1.20 1.19 1.24 1.15    Colostomy in place [facility]) See #1  Essential hypertension- (present on admission) Controlled, not on meds.  Protein-calorie malnutrition, severe- (present on admission) Augment nutritional status as tolerated.  Dietitian on board.  On Soft diet/TPN and wean TPN as tolerated Nutrition Problem: Severe Malnutrition Etiology: chronic illness, cancer and cancer related treatments Signs/Symptoms: severe fat depletion, severe muscle depletion, percent weight loss Interventions: TPN    DVT prophylaxis: SCDs Start: 01/29/22 0013. On heparin gtt since 2/27 Code Status:   Code Status: DNR Family Communication:plan of care discussed with patient/wife was updated previously.Palliative care met with  FAMILY 2/27.discussed with Dr. Marin Olp today. To update and discuss plan of care, I called daughter at 5:26 pm-no answer. Called his wife's cell  at 5/28- no answer. Also no answer at Home phone no. Per RN She was here during PT session today   Disposition:Currently not medically stable for discharge. Status QI:[id number] Remains Inpatient appropriate because: ongoing management of SBO/empyema   Objective: Vitals last 24 hrs: Vitals:   02/14/22 2104 02/15/22 0500 02/15/22 0703 02/15/22 1142  BP: 111/87  130/85 (!) 145/93  Pulse: 80  76 81  Resp: 20  18 16   Temp: 98.5 F (36.9 C)  (!) 97.5 F (36.4 C) 98.1 F (36.7 C)  TempSrc: Oral  Oral Oral  SpO2: 94%  96% 98%  Weight:  69.9 kg    Height:       Weight change: 0.1 kg  Physical Examination: General exam: AA0X2-3, ill looking, elderly, frail older than stated age, weak appearing. HEENT:Oral mucosa moist, Ear/Nose WNL grossly, dentition normal. Respiratory system: Slightly diminished on the left side,no use of accessory muscle Cardiovascular system: S1 & S2 +, No JVD,. Gastrointestinal system: Abdomen soft,NT,ND, BS+ Nervous System:Alert, awake, moving extremities  and grossly nonfocal Extremities: edema neg,distal  peripheral pulses palpable.  Skin: No rashes,no icterus. MSK: Normal muscle bulk,tone, power Colostomy in place w/ small amt of stool,nephrostomy in place with urine, smallbore chest tube in place  Medications reviewed:  Scheduled Meds:  Chlorhexidine Gluconate Cloth  6 each Topical Daily   dronabinol  5 mg Oral BID AC   feeding supplement  1 Container Oral TID BM   feeding supplement  237 mL Oral BID BM   insulin aspart  0-15 Units Subcutaneous Q6H   lidocaine  1 patch Transdermal Q24H   linezolid  600 mg Oral Q12H   lip balm  1 application Topical BID   methocarbamol  500 mg Oral QID   OLANZapine  2.5 mg Oral QHS   sodium chloride flush  10 mL Other Q8H   sodium chloride flush  3 mL Intravenous Q12H   Continuous Infusions:  sodium chloride Stopped (01/28/22 2301)   heparin 1,800 Units/hr (02/15/22 1723)   TPN ADULT (ION) Stopped (02/15/22 1717)   TPN ADULT (ION) 85 mL/hr at 02/15/22 1723    Pressure Injury 02/13/22 Coccyx Posterior Stage 2 -  Partial thickness loss of dermis presenting as a shallow open injury with a red, pink wound bed without slough. pink red shallow open area (Active)  02/13/22 (documented for [name] RN) [id number]  Location: Coccyx  Location Orientation: Posterior  Staging: Stage 2 -  Partial thickness loss of dermis presenting as a shallow open injury with a red, pink wound bed without slough.  Wound Description (Comments): pink red shallow open area  Present on Admission: Yes (per [name] RN present on assessment)   Diet Order             Diet clear liquid Room service appropriate? Yes; Fluid consistency: Thin  Diet effective now                    Nutrition Problem: Severe Malnutrition Etiology: chronic illness, cancer and cancer related treatments Signs/Symptoms: severe fat depletion, severe muscle depletion, percent weight loss Interventions: Boost Breeze, Ensure Enlive (each supplement provides 350kcal and 20 grams of protein),  TPN   Intake/Output Summary (Last 24 hours) at 02/15/2022 1724 Last data filed at 02/15/2022 1723 Gross per 24 hour  Intake 2482.65 ml  Output 2230 ml  Net 252.65 ml   Net IO Since Admission: 7,428.27 mL [02/15/22 1724]  Wt Readings from Last 3 Encounters:  02/15/22 69.9 kg  01/07/22 63.5 kg  12/31/21 63.5 kg     Unresulted Labs (From admission, onward)     Start     Ordered   02/16/22 0500  Comprehensive metabolic panel  Tomorrow morning,   R       Question:  Specimen collection method  Answer:  Lab=Lab collect   02/15/22 1232   02/15/22 2200  Heparin level (unfractionated)  Once-Timed,   TIMED       Question:  Specimen collection method  Answer:  Lab=Lab collect   02/15/22 1351   02/15/22 1316  Prealbumin  Once,   R       Question:  Specimen collection method  Answer:  Lab=Lab collect   02/15/22 1316   02/13/22 0500  CBC with Differential/Platelet  Daily,   R     Question:  Specimen collection method  Answer:  Lab=Lab collect   02/12/22 0649   01/31/22 0500  Comprehensive metabolic panel  (TPN Lab Panel)  Every Mon,Thu (0500),   R  Question:  Specimen collection method  Answer:  Unit=Unit collect   01/30/22 0742   01/31/22 0500  Magnesium  (TPN Lab Panel)  Every Mon,Thu (0500),   R     Question:  Specimen collection method  Answer:  Unit=Unit collect   01/30/22 0742   01/31/22 0500  Phosphorus  (TPN Lab Panel)  Every Mon,Thu (0500),   R     Question:  Specimen collection method  Answer:  Unit=Unit collect   01/30/22 0742   01/31/22 0500  Triglycerides  (TPN Lab Panel)  Every Monday (0500),   R     Question:  Specimen collection method  Answer:  Unit=Unit collect   01/30/22 0742          Data Reviewed: I have personally reviewed following labs and imaging studies CBC: Recent Labs  Lab 02/11/22 0607 02/12/22 1039 02/13/22 0623 02/14/22 0713 02/15/22 0357  WBC 35.5* 31.2* 24.3* 18.4* 20.2*  NEUTROABS  --   --  20.2* 15.1* 15.3*  HGB 10.8* 10.1* 10.4* 10.0*  10.3*  HCT 35.2* 33.1* 33.7* 32.3* 33.2*  MCV 97.5 99.1 96.8 97.0 96.0  PLT 854* 866* 1,086* 1,049* 1,610*   Basic Metabolic Panel: Recent Labs  Lab 02/09/22 0610 02/10/22 0812 02/12/22 0551 02/13/22 0623 02/14/22 0713 02/15/22 0357  NA 140 143   142 141 142 139 141  K 3.6 4.0   4.0 3.8 4.5 4.0 4.3  CL 107 112*   112* 114* 109 109 107  CO2 25 24   23 23 26 26 28   GLUCOSE 173* 160*   160* 177* 113* 160* 150*  BUN 50* 51*   50* 55* 52* 46* 48*  CREATININE 1.50* 1.23   1.20 1.19 1.24 1.15 1.25*  CALCIUM 8.3* 8.3*   8.2* 8.2* 8.5* 7.9* 8.0*  MG 2.3 2.3 2.2  --  2.0 2.1  PHOS 4.0 3.6  --   --  3.6 3.5   GFR: Estimated Creatinine Clearance: 46.6 mL/min (A) (by C-G formula based on SCr of 1.25 mg/dL (H)). Liver Function Tests: Recent Labs  Lab 02/09/22 0610 02/10/22 0812 02/13/22 0623 02/14/22 0713 02/15/22 0357  AST 108* 102* 83* 70* 60*  ALT 94* 91* 65* 60* 53*  ALKPHOS 207* 204* 224* 210* 228*  BILITOT 0.8 0.7 0.3 0.3 0.5  PROT 5.4* 5.8* 5.7* 5.3* 5.7*  ALBUMIN 1.5* 1.6* <1.5* <1.5* 1.6*   No results for input(s): LIPASE, AMYLASE in the last 168 hours. No results for input(s): AMMONIA in the last 168 hours. Coagulation Profile: Recent Labs  Lab 02/14/22 0950  INR 1.2   Cardiac Enzymes: No results for input(s): CKTOTAL, CKMB, CKMBINDEX, TROPONINI in the last 168 hours. BNP (last 3 results) No results for input(s): PROBNP in the last 8760 hours. HbA1C: No results for input(s): HGBA1C in the last 72 hours. CBG: Recent Labs  Lab 02/14/22 1211 02/14/22 1810 02/15/22 0015 02/15/22 0632 02/15/22 1144  GLUCAP 143* 113* 107* 142* 103*   Lipid Profile: Recent Labs    02/14/22 0713  TRIG 130    Thyroid Function Tests: No results for input(s): TSH, T4TOTAL, FREET4, T3FREE, THYROIDAB in the last 72 hours. Anemia Panel: Recent Labs    02/15/22 1358  TIBC 186*  IRON 32*   Sepsis Labs: Recent Labs  Lab 02/10/22 0812 02/11/22 0607 02/12/22 0551   PROCALCITON 1.88 1.41 1.37     Recent Results (from the past 240 hour(s))  Body fluid culture w Gram Stain     Status:  None (Preliminary result)   Collection Time: 02/10/22  1:37 PM   Specimen: PATH Cytology Pleural fluid  Result Value Ref Range Status   Specimen Description   Final    PLEURAL Performed at [facility] [address] [place], [place] [address]    Special Requests   Final    NONE Performed at [facility], [place] [address] [place], [place] [address]    Gram Stain FEW WBC SEEN NO ORGANISMS SEEN   Final   Culture   Final    MODERATE ENTEROCOCCUS FAECIUM VANCOMYCIN RESISTANT ENTEROCOCCUS CRITICAL RESULT CALLED TO, READ BACK BY AND VERIFIED WITH: RN LISA.P AT 1144 ON 02/11/2022 BY T.SAAD. CULTURE REINCUBATED FOR BETTER GROWTH Performed at [facility], [place] [address], [place], [place] [address]    Report Status PENDING  Incomplete   Organism ID, Bacteria ENTEROCOCCUS FAECIUM  Final      Susceptibility   Enterococcus faecium - MIC*    AMPICILLIN >=32 RESISTANT Resistant     VANCOMYCIN >=32 RESISTANT Resistant     GENTAMICIN SYNERGY SENSITIVE Sensitive     LINEZOLID 2 SENSITIVE Sensitive     * MODERATE ENTEROCOCCUS FAECIUM    Antimicrobials: Anti-infectives (From admission, onward)    Start     Dose/Rate Route Frequency Ordered Stop   02/14/22 1015  linezolid (ZYVOX) tablet 600 mg        600 mg Oral Every 12 hours 02/14/22 0929     02/11/22 1330  linezolid (ZYVOX) IVPB 600 mg  Status:  Discontinued        600 mg 300 mL/hr over 60 Minutes Intravenous Every 12 hours 02/11/22 1234 02/14/22 0929   02/04/22 1545  piperacillin-tazobactam (ZOSYN) IVPB 3.375 g        3.375 g 12.5 mL/hr over 240 Minutes Intravenous Every 8 hours 02/04/22 1453 02/09/22 1359   02/01/22 2200  ceFAZolin (ANCEF) IVPB 1 g/50 mL premix  Status:  Discontinued        1 g 100 mL/hr over 30 Minutes Intravenous Every 8 hours 02/01/22 1504 02/04/22  1453   02/01/22 0930  cefoTEtan (CEFOTAN) 2 g in sodium chloride 0.9 % 100 mL IVPB        2 g 200 mL/hr over 30 Minutes Intravenous On call to O.R. 01/31/22 1908 02/01/22 1533   01/30/22 1000  cefTRIAXone (ROCEPHIN) 1 g in sodium chloride 0.9 % 100 mL IVPB  Status:  Discontinued        1 g 200 mL/hr over 30 Minutes Intravenous Every 24 hours 01/29/22 0013 02/01/22 1504   01/28/22 1830  cefTRIAXone (ROCEPHIN) 1 g in sodium chloride 0.9 % 100 mL IVPB        1 g 200 mL/hr over 30 Minutes Intravenous  Once 01/28/22 1828 01/28/22 1927      Culture/Microbiology    Component Value Date/Time   SDES  02/10/2022 1337    PLEURAL Performed at [facility], [place] [address], [place], [place] [address]    SPECREQUEST  02/10/2022 1337    NONE Performed at [facility], [place] [address] [place], [place] [address]    CULT  02/10/2022 1337    MODERATE ENTEROCOCCUS FAECIUM VANCOMYCIN RESISTANT ENTEROCOCCUS CRITICAL RESULT CALLED TO, READ BACK BY AND VERIFIED WITH: RN LISA.P AT 1144 ON 02/11/2022 BY T.SAAD. CULTURE REINCUBATED FOR BETTER GROWTH Performed at [facility], [place] [address], [place], [place] [address]    REPTSTATUS PENDING 02/10/2022 [phone]  Radiology Studies: DG CHEST PORT 1 VIEW  Result Date: 02/14/2022 CLINICAL DATA:  Empyema EXAM: PORTABLE CHEST 1 VIEW COMPARISON:  Chest x-ray dated February 13, 2022 FINDINGS: Cardiac and mediastinal contours are unchanged. Bilateral nodular opacities and heterogeneous opacities of left lung, unchanged compared to prior. Small left hydropneumothorax with slightly decreased size of air component. Small right pleural effusion. IMPRESSION: Small left hydropneumothorax with slightly decreased size of air component. Left-sided chest tube in place. Electronically Signed   By: Yetta Glassman M.D.   On: 02/14/2022 12:10   ECHOCARDIOGRAM COMPLETE BUBBLE STUDY  Result Date: 02/14/2022    ECHOCARDIOGRAM REPORT    Patient Name:   Kevin Villegas Date of Exam: 02/14/2022 Medical Rec #:  [id number]      Height:       71.0 in Accession #:    [id number]     Weight:       153.9 lb Date of Birth:  Dec 12, 1941      BSA:          1.886 m Patient Age:    59 years       BP:           133/95 mmHg Patient Gender: M              HR:           80 bpm. Exam Location:  Inpatient Procedure: 2D Echo and Saline Contrast Bubble Study Indications:    Pleural effusion. Saline contrast study requested.  History:        Patient has no prior history of Echocardiogram examinations.  Sonographer:    Arlyss Gandy Referring Phys: [id number] [place]  1. The aortic valve is calcified. There is severe calcifcation of the aortic valve. There is severe thickening of the aortic valve. Aortic valve regurgitation is not visualized. LVOT VTI signal suboptimal and therefore difficult to calculate AVA and DI.  Suspect severe vs moderate-to-severe aortic stenosis. Mean gradient 29mHg, Vmax 3.540m. AVA by rough estimate 0.7cm2, DI 0.22. Consider TEE vs CTA for further evaluation.  2. Left ventricular ejection fraction, by estimation, is 55 to 60%. The left ventricle has normal function. The left ventricle has no regional wall motion abnormalities. There is moderate hypertrophy of the basal septum. The rest of the LV segments demonstrate mild left ventricular hypertrophy. Left ventricular diastolic parameters are consistent with Grade I diastolic dysfunction (impaired relaxation).  3. Right ventricular systolic function is normal. The right ventricular size is normal. There is normal pulmonary artery systolic pressure.  4. The mitral valve is normal in structure. Trivial mitral valve regurgitation.  5. Aortic dilatation noted. There is moderate dilatation of the ascending aorta, measuring 45 mm.  6. The inferior vena cava is normal in size with greater than 50% respiratory variability, suggesting right atrial pressure of 3 mmHg. Comparison(s): No prior  Echocardiogram. FINDINGS  Left Ventricle: Left ventricular ejection fraction, by estimation, is 55 to 60%. The left ventricle has normal function. The left ventricle has no regional wall motion abnormalities. The left ventricular internal cavity size was normal in size. There is  moderate hypertrophy of the basal septum. The rest of the LV segments demonstrate mild left ventricular hypertrophy. Left ventricular diastolic parameters are consistent with Grade I diastolic dysfunction (impaired relaxation). Right Ventricle: The right ventricular size is normal. No increase in right ventricular wall thickness. Right ventricular systolic function is normal. There is normal pulmonary artery systolic pressure. The tricuspid regurgitant velocity is 2.81 m/s, and  with an assumed right atrial pressure of 3 mmHg, the estimated right ventricular systolic pressure is 58.8 mmHg. Left Atrium: Left atrial size was normal in size. Right Atrium: Right atrial size was normal in size. Pericardium: There is no evidence of pericardial effusion. Mitral Valve: The mitral valve is normal in structure. Mild mitral annular calcification. Trivial mitral valve regurgitation. Tricuspid Valve: The tricuspid valve is normal in structure. Tricuspid valve regurgitation is trivial. Aortic Valve: The aortic valve is calcified. There is severe calcifcation of the aortic valve. There is severe thickening of the aortic valve. Aortic valve regurgitation is not visualized. Moderate to severe aortic stenosis is present. Aortic valve mean gradient measures 17.8 mmHg. Aortic valve peak gradient measures 32.6 mmHg. Aortic valve area, by VTI measures 0.97 cm. Pulmonic Valve: The pulmonic valve was not well visualized. Pulmonic valve regurgitation is trivial. Aorta: Aortic dilatation noted. There is moderate dilatation of the ascending aorta, measuring 45 mm. Venous: The inferior vena cava is normal in size with greater than 50% respiratory variability,  suggesting right atrial pressure of 3 mmHg. IAS/Shunts: The atrial septum is grossly normal. Agitated saline contrast was given intravenously to evaluate for intracardiac shunting.  LEFT VENTRICLE PLAX 2D LVIDd:         3.40 cm   Diastology LVIDs:         2.40 cm   LV e' medial:    5.11 cm/s LV PW:         1.30 cm   LV E/e' medial:  12.6 LV IVS:        1.50 cm   LV e' lateral:   5.22 cm/s LVOT diam:     2.40 cm   LV E/e' lateral: 12.3 LV SV:         49 LV SV Index:   26 LVOT Area:     4.52 cm  RIGHT VENTRICLE             IVC RV Basal diam:  3.40 cm     IVC diam: 1.20 cm RV Mid diam:    2.90 cm RV S prime:     15.40 cm/s TAPSE (M-mode): 1.9 cm LEFT ATRIUM           Index        RIGHT ATRIUM           Index LA diam:      2.00 cm 1.06 cm/m   RA Area:     10.40 cm LA Vol (A2C): 16.1 ml 8.54 ml/m   RA Volume:   23.70 ml  12.56 ml/m LA Vol (A4C): 24.0 ml 12.72 ml/m  AORTIC VALVE AV Area (Vmax):    0.93 cm AV Area (Vmean):   0.98 cm AV Area (VTI):     0.97 cm AV Vmax:           285.50 cm/s AV Vmean:          187.250 cm/s AV VTI:            0.507 m AV Peak Grad:      32.6 mmHg AV Mean Grad:      17.8 mmHg LVOT Vmax:         58.80 cm/s LVOT Vmean:        40.400 cm/s LVOT VTI:          0.109 m LVOT/AV VTI ratio: 0.21  AORTA Ao Root diam: 3.50 cm Ao Asc diam:  4.50 cm MITRAL VALVE  TRICUSPID VALVE MV Area (PHT): 2.36 cm    TR Peak grad:   31.6 mmHg MV Decel Time: 322 msec    TR Vmax:        281.00 cm/s MV E velocity: 64.30 cm/s MV A velocity: 80.80 cm/s  SHUNTS MV E/A ratio:  0.80        Systemic VTI:  0.11 m                            Systemic Diam: 2.40 cm Gwyndolyn Kaufman MD Electronically signed by Gwyndolyn Kaufman MD Signature Date/Time: 02/14/2022/5:22:27 PM    Final    VAS [place] LOWER EXTREMITY VENOUS (DVT)  Result Date: 02/14/2022  Lower Venous DVT Study Patient Name:  CHANDON LAZCANO  Date of Exam:   02/14/2022 Medical Rec #: [id number]       Accession #:    [id number] Date of Birth: 1941/06/15        Patient Gender: M Patient Age:   74 years Exam Location:  [facility] Procedure:      VAS [place] LOWER EXTREMITY VENOUS (DVT) Referring Phys: [place] --------------------------------------------------------------------------------  Indications: Edema.  Comparison Study: No prior study Performing Technologist: Maudry Mayhew MHA, RDMS, RVT, RDCS  Examination Guidelines: A complete evaluation includes B-mode imaging, spectral Doppler, color Doppler, and power Doppler as needed of all accessible portions of each vessel. Bilateral testing is considered an integral part of a complete examination. Limited examinations for reoccurring indications may be performed as noted. The reflux portion of the exam is performed with the patient in reverse Trendelenburg.  +-----+---------------+---------+-----------+----------+--------------+  RIGHT Compressibility Phasicity Spontaneity Properties Thrombus Aging  +-----+---------------+---------+-----------+----------+--------------+  CFV   Full            Yes       Yes                                    +-----+---------------+---------+-----------+----------+--------------+   +---------+---------------+---------+-----------+----------+-----------------+  LEFT      Compressibility Phasicity Spontaneity Properties Thrombus Aging     +---------+---------------+---------+-----------+----------+-----------------+  CFV       Full            No        Yes                                       +---------+---------------+---------+-----------+----------+-----------------+  SFJ       Full                                                                +---------+---------------+---------+-----------+----------+-----------------+  FV Prox   Full                                                                +---------+---------------+---------+-----------+----------+-----------------+  FV Mid    None  No                     Acute               +---------+---------------+---------+-----------+----------+-----------------+  FV Distal None                      No                     Acute              +---------+---------------+---------+-----------+----------+-----------------+  PFV       Full                                                                +---------+---------------+---------+-----------+----------+-----------------+  POP       Full            No        Yes                                       +---------+---------------+---------+-----------+----------+-----------------+  PTV       None            Yes       Yes                    Age Indeterminate  +---------+---------------+---------+-----------+----------+-----------------+  PERO      None                      No                     Acute              +---------+---------------+---------+-----------+----------+-----------------+    Summary: RIGHT: - No evidence of common femoral vein obstruction.  LEFT: - Findings consistent with acute deep vein thrombosis involving the left femoral vein, and left peroneal veins. - Findings consistent with age indeterminate deep vein thrombosis involving the left peroneal veins.  *See table(s) above for measurements and observations. Electronically signed by Servando Snare MD on 02/14/2022 at 10:42:12 AM.    Final      LOS: 66 days   Antonieta Pert, MD [facility]  02/15/2022, 5:24 PM

## 2022-02-15 NOTE — Assessment & Plan Note (Signed)
likely reactive.  See above

## 2022-02-15 NOTE — Assessment & Plan Note (Addendum)
Multifactorial with deconditioning multiple complex comorbidities.

## 2022-02-15 NOTE — Progress Notes (Signed)
Mr. Wacker is now down on 4 W.  He has the thrombus in the left leg.  I just hate that he has this.  He was on prophylactic measures.  However, given his deconditioning, metastatic pancreatic cancer, I suppose that the thrombus certainly is not all that surprising.  He is on heparin right now.  I think is by going need to be on Lovenox as an outpatient.  I would keep him on heparin for right now as this can be monitored closely.  He has the Enterococcus in the pleural fluid.  Thankfully, there is no malignancy in the pleural effusion.  He is on oral Zyvox now.  Hopefully, this will be absorbed and levels will be adequate to help eradicate this Enterococcus.  He is still on TPN.  I really do not think he is eating much.  I think were going to somehow have to get him to eat better.  I think this is can be the only way that we will be able to think about doing any kind of treatment on the metastatic cancer.  I will try him on some Marinol to see if this may help stimulate his appetite.  He does have some confusion.  Again I think he is just so deconditioned.  I will check a prealbumin on him and we can see what that looks like.  I did have a long talk with his wife on the phone this afternoon.  I gave her my take as to what is going on.  I just hate the fact that he has had a really difficult time after surgery.  I would have never thought that he would have had this type of difficulty.  However, I told her that wound patient has cancer surgery for what ever reason, there can certainly be complications that no one can foretell and then recovery can be incredibly difficult.  I still would like to think that he can make some improvement so he can have some quality of life.  I told her that his nutritional state will dictate whether or not we can even think about doing any kind of treatment on him.  She would like for him to be able to come home and be active, if possible.  Again, I know that I sound like  a broken record but we really have to get the nutritional state up.  Hopefully, Marinol will be able to help with this.  I know that his marked thrombocytosis is all reactive.  I am sure this will come down over time.  I will check iron studies on him to see if he is iron deficient.  It would not surprise me if he is iron deficient.  I realize this is incredibly complicated.  I know that the nurses on 4 W. will try to do a wonderful job helping take care of him.  I know that he has had a lot of strength in the past.  Hopefully he will be able to regain this.  Lattie Haw, MD  [place] 23:25

## 2022-02-15 NOTE — Progress Notes (Signed)
[place] for IV heparin Indication: DVT  Allergies  Allergen Reactions   Pollen Extract Other (See Comments)    Sinus congestion    Patient Measurements: Height: 5\' 11"  (180.3 cm) Weight: 69.8 kg (153 lb 14.1 oz) IBW/kg (Calculated) : 75.3 Heparin Dosing Weight: actual body weight   Vital Signs: Temp: 98.5 F (36.9 C) (02/27 2104) Temp Source: Oral (02/27 2104) BP: 111/87 (02/27 2104) Pulse Rate: 80 (02/27 2104)  Labs: Recent Labs    02/13/22 0623 02/14/22 0713 02/14/22 0950 02/14/22 1801 02/15/22 0357  HGB 10.4* 10.0*  --   --  10.3*  HCT 33.7* 32.3*  --   --  33.2*  PLT 1,086* 1,049*  --   --  1,066*  APTT  --   --  30  --   --   LABPROT  --   --  14.9  --   --   INR  --   --  1.2  --   --   HEPARINUNFRC  --   --   --  <0.10* 0.15*  CREATININE 1.24 1.15  --   --  1.25*     Estimated Creatinine Clearance: 46.5 mL/min (A) (by C-G formula based on SCr of 1.25 mg/dL (H)).   Medical History: Past Medical History:  Diagnosis Date   Colostomy present [facility])    Large bowel obstruction ([place])    Pancreatic cancer [facility])     Assessment: 20 y/oM with PMH of metastatic pancreatic cancer and history of SBO presented to [facility] ED for abdominal pain, nausea, vomiting found to have partial SBO s/p laparoscopic lysis of adhesions, small bowel resection, excisional bipsy of mesentery mass on 2/14. Underwent left thoracentesis for pleural effusion 2/23, chest tube placed 2/24, intrapleural lytics given 2/25. Today, left lower extremity venous duplex + for LLE DVT. Pharmacy consulted for IV heparin dosing. Hgb low but stable at 10, Pltc elevated at 1049K. Note patient has been ordered Enoxaparin 40mg  SQ q24h for VTE prophylaxis throughout this admission, but patient refused doses 2/24-2/26 (last dose administered 2/23 at 1004).   Today, 02/15/22 - Heparin level 0.15 on heparin 1350 units/hr - CBC:  WBC 10.3 (stable), Pltc remains elevated at  1066K - Discussed with RN - no issues with heparin drip infusing and no bleeding reported  Goal of Therapy:  Heparin level 0.3-0.7 units/ml Monitor platelets by anticoagulation protocol: Yes   Plan:  Heparin rebolus 2000 units x1 Increase heparin drip to 1550 units/hr Heparin level 8 hours after increase Daily CBC, heparin level Monitor closely for s/sx of bleeding   Leone Haven, PharmD 02/15/2022 4:38 AM

## 2022-02-15 NOTE — Progress Notes (Signed)
Patient refused to take his robaxin, olanzapine and linezolid. NP informed.

## 2022-02-15 NOTE — Progress Notes (Addendum)
NAME:  Kevin Villegas, MRN:  [id number], DOB:  12/31/1940, LOS: 28 ADMISSION DATE:  01/28/2022, CONSULTATION DATE:  02/11/2022 REFERRING MD:  Dr. Antonieta Pert, CHIEF COMPLAINT:  Pleural effusion   History of Present Illness:  Kevin Villegas is a 81 year old unfortunate man with metastatic pancreatic cancer.  He was admitted 2/9 for recurrent small bowel obstruction and on 2/14 underwent lysis of adhesions, small bowel resection and excision biopsy of mesenteric mass by Dr. 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 He was  treated for E. coli UTI   He also has a history of left hydronephrosis status post nephrostomy tube with stent, chronic right renal atrophy and hydroureteronephrosis   Review of his serial imaging dating back to November of last year shows metastatic lung nodules with left pleural disease and a small to moderate size left effusion. He had persistent leukocytosis and underwent left thoracentesis 2/23 , only 50 cc of thick right purulent fluid was removed. Pleural fluid results shows 25K cells with 100% neutrophils, culture showing Enterococcus.  Pertinent  Medical History  Metastatic pancreatic cancer, large bowel obstruction resulting in colectomy and colostomy  Significant Hospital Events: Including procedures, antibiotic start and stop dates in addition to other pertinent events   2/10 admitted  2/14 Underwent lysis of adhesions and laparoscopic small bowel resection with Dr. [name] 2/23 underwent left thoracentesis with 15ml fluid removed 2/24 Pulmonary consulted for management of loculated pleural effusion , pigtail placed 2/25 lytics #1 2/26 left ex-vacuo pneumothorax  Images  1.No evidence of postoperative intra-abdominal abscess. Moderate right upper quadrant free fluid and small amount of free fluid in the pelvis is non organized. 2. Enteric tube terminates at the gastroesophageal junction. 3. Left nephrostomy and left  nephroureteral stent in place without left hydronephrosis. Chronic right renal atrophy and hydroureteronephrosis. 4. Pulmonary metastatic disease in the lung bases with multiple pulmonary nodules. Enlarging left pleural effusion with pleural thickening, possibly metastatic. Moderate-sized right pleural effusion is new. 5. Diffuse body wall edema. 6. Soft tissue nodule in the right anterior abdominal wall is unchanged, possibly metastatic. 7. Additional chronic findings are unchanged.  Interim History / Subjective:   Afebrile 98% sat on room air. Chest tube drained 580 cc last 24 hours  Objective   Blood pressure (!) 145/93, pulse 81, temperature 98.1 F (36.7 C), temperature source Oral, resp. rate 16, height 5\' 11"  (1.803 m), weight 69.9 kg, SpO2 98 %.        Intake/Output Summary (Last 24 hours) at 02/15/2022 1342 Last data filed at 02/15/2022 1141 Gross per 24 hour  Intake 1547.31 ml  Output 2280 ml  Net -732.69 ml    Filed Weights   02/13/22 0518 02/14/22 0500 02/15/22 0500  Weight: 69.7 kg 69.8 kg 69.9 kg    Examination: General: chronically ill appearing deconditioned frail elderly male lying in bed in NAD HEENT: [place]/AT, MM pink/moist, PERRL,  Neuro: Nonfocal, alert oriented x3, lethargic CV: S1-S2 regular PULM: Decreased breath sounds on left, left pigtail with serous drainage, good pleural variation GI: soft, bowel sounds active in all 4 quadrants, non-tender, slightly distended, left nephrostomy tube  Extremities: warm/dry, no edema  Skin: no rashes or lesions  Chest x-ray 2/27 independently reviewed shows ex-vacuo pneumothorax is persistent, decreased diffusion  Resolved Hospital Problem list     Assessment & Plan:  Left empyema -with Enterococcus likely seeding previously known loculated left effusion, likely malignant -CT ABD pelvis revealed pulmonary metastatic disease in the lung bases with  multiple pulmonary nodules. Enlarging left pleural effusion  with pleural thickening, possibly metastatic and moderate-sized right pleural effusion -IR preformed left thoracentesis 2/23 with 50 ml of light red/purulent/thick fluid removed  Ex-vacuo pneumothorax status post pigtail likely represents trapped lung  Pleural fluid cytology is negative P:  Continue Zyvox Do not think more lytics will help here , we may have reached maximum benefit with chest tube  -will continue chest tube to suction  -Plan to discontinue chest tube once drainage less than 150 cc  Acute LLE DVT -on IV heparin Okay to mobilize by PT once therapeutic  Palliative conversations in progress, reviewed notes from palliative care, surgery, oncology  Best Practice (right click and "Reselect all SmartList Selections" daily)  Per primary   Labs   CBC: Recent Labs  Lab 02/11/22 0607 02/12/22 1039 02/13/22 0623 02/14/22 0713 02/15/22 0357  WBC 35.5* 31.2* 24.3* 18.4* 20.2*  NEUTROABS  --   --  20.2* 15.1* 15.3*  HGB 10.8* 10.1* 10.4* 10.0* 10.3*  HCT 35.2* 33.1* 33.7* 32.3* 33.2*  MCV 97.5 99.1 96.8 97.0 96.0  PLT 854* 866* 1,086* 1,049* 1,066*     Basic Metabolic Panel: Recent Labs  Lab 02/09/22 0610 02/10/22 0812 02/12/22 0551 02/13/22 0623 02/14/22 0713 02/15/22 0357  NA 140 143   142 141 142 139 141  K 3.6 4.0   4.0 3.8 4.5 4.0 4.3  CL 107 112*   112* 114* 109 109 107  CO2 25 24   23 23 26 26 28   GLUCOSE 173* 160*   160* 177* 113* 160* 150*  BUN 50* 51*   50* 55* 52* 46* 48*  CREATININE 1.50* 1.23   1.20 1.19 1.24 1.15 1.25*  CALCIUM 8.3* 8.3*   8.2* 8.2* 8.5* 7.9* 8.0*  MG 2.3 2.3 2.2  --  2.0 2.1  PHOS 4.0 3.6  --   --  3.6 3.5    GFR: Estimated Creatinine Clearance: 46.6 mL/min (A) (by C-G formula based on SCr of 1.25 mg/dL (H)). Recent Labs  Lab 02/10/22 0812 02/11/22 0607 02/12/22 0551 02/12/22 1039 02/13/22 0623 02/14/22 0713 02/15/22 0357  PROCALCITON 1.88 1.41 1.37  --   --   --   --   WBC 38.6* 35.5*  --  31.2* 24.3* 18.4* 20.2*      Liver Function Tests: Recent Labs  Lab 02/09/22 0610 02/10/22 0812 02/13/22 0623 02/14/22 0713 02/15/22 0357  AST 108* 102* 83* 70* 60*  ALT 94* 91* 65* 60* 53*  ALKPHOS 207* 204* 224* 210* 228*  BILITOT 0.8 0.7 0.3 0.3 0.5  PROT 5.4* 5.8* 5.7* 5.3* 5.7*  ALBUMIN 1.5* 1.6* <1.5* <1.5* 1.6*    No results for input(s): LIPASE, AMYLASE in the last 168 hours. No results for input(s): AMMONIA in the last 168 hours.  ABG    Component Value Date/Time   TCO2 28 12/10/2021 0036     Kara Mead MD. FCCP. [facility] Pager : [phone]  If no response to pager , please call [phone] until 7 pm After 7:00 pm call Elink  [phone]    02/15/2022, 1:42 PM

## 2022-02-15 NOTE — Progress Notes (Addendum)
PHARMACY - TOTAL PARENTERAL NUTRITION CONSULT NOTE   Indication: Small bowel obstruction and inability to meet nutritional needs w/ PO intake  Patient Measurements: Height: 5' 11"  (180.3 cm) Weight: 69.9 kg (154 lb 1.6 oz) IBW/kg (Calculated) : 75.3 TPN AdjBW (KG): 57.7 Body mass index is 21.49 kg/m. Usual Weight: Weight noted in November 2022 to be 63.5 kg  Assessment: 81 yo male presenting to [facility] ED for abdominal pain, nausea, vomiting. PMH includes pancreatic cancer with evidence of metastatic disease to his bladder, lungs, and pleural space and history of SBO.  S/p stent placement between stomach and intestines and colostomy placement in June 2022. Pharmacy is consulted for TPN management for SBO and inability to meet nutritional needs w/ PO intake.   Glucose / Insulin: No hx DM; CBGs at goal on moderate SSI q6h - 2 units SSI used in past 24 hours Electrolytes: All, including Corrected Calcium, WNL Renal: SCr increased to 1.25, BUN elevated/increased to 48; UOP remains adequate Hepatic: AST/ALT elevated, but trending down. Alk Phos elevated and slightly increased. Tbili stable/WNL. Albumin low. Prealbumin low at 8.1 (2/26). TG WNL, 130 (2/27) Intake / Output; MIVF: I/O 1667/2580; no IVF -Chest tube output/drainage 536m -Oral intake charted: 3655m GI Imaging: - 2/10 CT abdomen pelvis: re-demonstration of high-grade partial SBO with transition point in the upper left pelvis psych likely due to adhesion.  - 2/20 abd CT: No evidence of postoperative intra-abdominal abscess. BL pleural effusions. Pulmonary metastatic disease in the lung bases with multiple pulmonary nodules. - 2/25 CT: worsened SB loop dilation suggesting ileus or pSBO GI Surgeries / Procedures:  - 2/14: laparoscopic lysis of adhesions, small bowel resection, excisional bipsy of mesentery mass   Central access: PAC TPN start date: 2/12  Nutritional Goals: Goal TPN rate is 85 mL/hr (provides 112g of protein and 2061  kcals per day)  RD Assessment:  Estimated Needs Total Energy Estimated Needs: 2050-2250 kcal Total Protein Estimated Needs: 105-120 grams Total Fluid Estimated Needs: >/= 2 L/day  Current Nutrition:  Clear liquid diet Boost Breeze TID (currently refusing) Ensure Plus BID (currently refusing) TPN  Plan:  At 1800: - Continue full-rate TPN at 85 ml/hr - Electrolytes in TPN: no change from yesterday Na 10078mL K 4m37m Ca 0mEq65mMg 5 mEq/L Phos 10mmo24mCl:Ac max Ac - Discontinue MVI PO since patient refusing. Add back trace elements daily into TPN. Will add multivitamins M/W/F only in TPN due to national shortage.    - Continue moderate SSI q6h and adjust as needed - TPN labs on Mon/Thurs - CMET in AM - f/u ability to advance diet and wean TPN when able   Yotam Rhine Lindell SparmD, BCPS Clinical Pharmacist  02/15/2022, 7:37 AM

## 2022-02-15 NOTE — Progress Notes (Addendum)
OT Cancellation Note  Patient Details Name: [name] MRN: [id number] DOB: Jul 12, 1941   Cancelled Treatment:    Reason Eval/Treat Not Completed: Other (comment) Patient noted to have increased confusion and fatigue at this time. Patient agreed to participate in session and reported wanting to complete oral hygiene. When set up with task, patient reported he wanted to sleep and having increased pain. Nurse made aware. OT to continue to follow and check back as schedule will allow.  Jackelyn Poling OTR/L, [place] [facility] Department Office# [phone] Pager# [phone]   02/15/2022, 4:02 PM

## 2022-02-15 NOTE — TOC Benefit Eligibility Note (Addendum)
Patient Teacher, English as a foreign language completed.    The patient is currently admitted and upon discharge could be taking linezolid (Zyvox) 600 mg tablets.  The current 14 day co-pay is, $68.23.   The patient is insured through [place], [place] Patient Advocate Specialist [place] Patient Advocate Team Direct Number: [phone]  Fax: [phone]

## 2022-02-15 NOTE — Progress Notes (Signed)
Physical Therapy Treatment Patient Details Name: Kevin Villegas MRN: [id number] DOB: 08/04/1941 Today's Date: 02/15/2022   History of Present Illness Patient is a 81 year old male who presented on 01/28/22 with 2-3 day history of abdominal pain. patient was found to have SBO. patient underwent diagnostic laparoscopy with lysis of adhesions 2/14. New LLE DVT 2/27. PMH:HTN, stage 4 pancreatic cancer,CKD III, status post colectomy, hydronephrosis status post nephrostomy.    PT Comments    Pt resting upon arrival to room, verbally responds to PT but keeps eyes closed initially. Pt's wife present and encouraging pt to mobilize. Per Dr. Elsworth Soho, pt appropriate to sit EOB/stand but do not progress beyond until heparin is therapeutic. Pt requiring mod +2 to move to/from EOB, very limited tolerance for siting given fatigue and R flank pain. PT to continue to follow acutely.    Recommendations for follow up therapy are one component of a multi-disciplinary discharge planning process, led by the attending physician.  Recommendations may be updated based on patient status, additional functional criteria and insurance authorization.  Follow Up Recommendations  Skilled nursing-short term rehab (<3 hours/day)     Assistance Recommended at Discharge Frequent or constant Supervision/Assistance  Patient can return home with the following A lot of help with walking and/or transfers;A lot of help with bathing/dressing/bathroom;Assistance with cooking/housework;Assist for transportation   Equipment Recommendations  Rolling walker (2 wheels)    Recommendations for Other Services       Precautions / Restrictions Precautions Precautions: Fall Precaution Comments: abdominal wounds, ostomy, nephrostomy, colostomy, chest tube to L chest wall to wall suction; heparin is subtherapeutic after 24 hours, per Dr. Elsworth Soho via secure chat on 2/28 pt appropriate to mobilize to EOB and into standing, further mobility should be  deferred until heparin is therapeutic Restrictions Weight Bearing Restrictions: No     Mobility  Bed Mobility Overal bed mobility: Needs Assistance Bed Mobility: Rolling, Sit to Supine, Supine to Sit Rolling: Min assist   Supine to sit: Mod assist, +2 for physical assistance Sit to supine: Mod assist, +2 for physical assistance   General bed mobility comments: mod +2 for trunk elevation/lowering and LE managment. Increased time and effort, requires max encouragement    Transfers                   General transfer comment: pt declines attempt    Ambulation/Gait                   Stairs             Wheelchair Mobility    Modified Rankin (Stroke Patients Only)       Balance Overall balance assessment: Needs assistance Sitting-balance support: No upper extremity supported, Feet supported Sitting balance-Leahy Scale: Fair Sitting balance - Comments: EOB sitting x1.5 minutes, then states "let me lay back down, that's enough"       Standing balance comment: pt declines attempt                            Cognition Arousal/Alertness: Awake/alert Behavior During Therapy: WFL for tasks assessed/performed Overall Cognitive Status: Impaired/Different from baseline Area of Impairment: Orientation                 Orientation Level: Disoriented to, Place, Time, Situation             General Comments: Pt states he is in new [place], PT oriented pt to location and  situation which pt does not recall.        Exercises General Exercises - Lower Extremity Heel Slides: AROM, Right, 5 reps, Supine Hip ABduction/ADduction: AAROM, Right, 5 reps, Supine Shoulder Exercises Shoulder Flexion: AROM, Both, 5 reps, Supine    General Comments        Pertinent Vitals/Pain Pain Assessment Pain Assessment: Faces Faces Pain Scale: Hurts even more Pain Location: R flank Pain Descriptors / Indicators: Sharp, Grimacing, Sore Pain  Intervention(s): Limited activity within patient's tolerance, Monitored during session, Repositioned    Home Living                          Prior Function            PT Goals (current goals can now be found in the care plan section) Acute Rehab PT Goals Patient Stated Goal: decrease pain PT Goal Formulation: With patient Time For Goal Achievement: 02/16/22 Potential to Achieve Goals: Good Progress towards PT goals: Progressing toward goals    Frequency    Min 3X/week      PT Plan Current plan remains appropriate    Co-evaluation              AM-PAC PT "6 Clicks" Mobility   Outcome Measure  Help needed turning from your back to your side while in a flat bed without using bedrails?: A Lot Help needed moving from lying on your back to sitting on the side of a flat bed without using bedrails?: A Lot Help needed moving to and from a bed to a chair (including a wheelchair)?: Total Help needed standing up from a chair using your arms (e.g., wheelchair or bedside chair)?: Total Help needed to walk in hospital room?: Total Help needed climbing 3-5 steps with a railing? : Total 6 Click Score: 8    End of Session   Activity Tolerance: Patient limited by fatigue;Patient limited by pain Patient left: in chair;with call bell/phone within reach;with chair alarm set Nurse Communication: Mobility status PT Visit Diagnosis: Other abnormalities of gait and mobility (R26.89);Muscle weakness (generalized) (M62.81)     Time: 1610-9604 PT Time Calculation (min) (ACUTE ONLY): 23 min  Charges:  $Therapeutic Activity: 8-22 mins                     Stacie Glaze, PT DPT Acute Rehabilitation Services Pager [phone]  Office [phone]    Roxine Caddy E Ruffin Pyo 02/15/2022, 3:19 PM

## 2022-02-15 NOTE — Progress Notes (Signed)
Patient did not eat anything yesterday (02/14/22), only drank some water and some cranberry juice. Pt asked for food several times, but stated his stomach felt "full" when drinking and c/o abdominal pain. Today, (02/15/22) patient ate 3 bites of jello and then refused any more. C/o increasing abdominal pain and pain to his right ribs. PRN Fentanyl given x3 this shift so far. Will continue to monitor.

## 2022-02-15 NOTE — Progress Notes (Signed)
Daily Progress Note   Patient Name: Kevin Villegas       Date: 02/15/2022 DOB: May 16, 1941  Age: 81 y.o. MRN#: [id number] Attending Physician: Antonieta Pert, MD Primary Care Physician: Dettinger, [name], MD Admit Date: 01/28/2022  Reason for Consultation/Follow-up: Establishing goals of care  Subjective: I met today with Kevin Villegas, also discussed with his bedside RN. Patient complains of pain. Does not feel well today.     Length of Stay: 17  Current Medications: Scheduled Meds:   Chlorhexidine Gluconate Cloth  6 each Topical Daily   feeding supplement  1 Container Oral TID BM   feeding supplement  237 mL Oral BID BM   insulin aspart  0-15 Units Subcutaneous Q6H   lidocaine  1 patch Transdermal Q24H   linezolid  600 mg Oral Q12H   lip balm  1 application Topical BID   methocarbamol  500 mg Oral QID   OLANZapine  2.5 mg Oral QHS   sodium chloride flush  10 mL Other Q8H   sodium chloride flush  3 mL Intravenous Q12H    Continuous Infusions:  sodium chloride Stopped (01/28/22 2301)   heparin 1,550 Units/hr (02/15/22 0444)   TPN ADULT (ION) 85 mL/hr at 02/14/22 1833   TPN ADULT (ION)      PRN Meds: sodium chloride, acetaminophen, alum & mag hydroxide-simeth, antiseptic oral rinse, diphenhydrAMINE **OR** diphenhydrAMINE, fentaNYL (SUBLIMAZE) injection, iohexol, menthol-cetylpyridinium, ondansetron (ZOFRAN) IV, oxyCODONE, phenol, prochlorperazine, simethicone, sodium chloride flush  Physical Exam         Appears chronically ill  Resting in bed No edema Awake alert S 1 S 2  Tender over lower abdomen mild to moderate.   Vital Signs: BP (!) 145/93 (BP Location: Right Arm)    Pulse 81    Temp 98.1 F (36.7 C) (Oral)    Resp 16    Ht 5' 11"  (1.803 m)    Wt 69.9 kg    SpO2 98%     BMI 21.49 kg/m  SpO2: SpO2: 98 % O2 Device: O2 Device: Room Air O2 Flow Rate: O2 Flow Rate (L/min): 2 L/min  Intake/output summary:  Intake/Output Summary (Last 24 hours) at 02/15/2022 1256 Last data filed at 02/15/2022 1141 Gross per 24 hour  Intake 1547.31 ml  Output 2280 ml  Net -732.69 ml  LBM: Last BM Date : 02/13/22 Baseline Weight: Weight: 57 kg Most recent weight: Weight: 69.9 kg       Palliative Assessment/Data:      Patient Active Problem List   Diagnosis Date Noted   Pressure injury of skin 02/15/2022   Thrombocytosis 19/50/9326   Acute metabolic encephalopathy 71/24/5809   Leg DVT (deep venous thromboembolism), acute, left ([place]) 02/14/2022   Leg edema, left 02/13/2022   Empyema lung ([place])    Goals of care, counseling/discussion 02/10/2022   Empyema of left pleural space ([place]) 02/10/2022   E-coli UTI 02/01/2022   SBO (small bowel obstruction) ([place]) 01/28/2022   Subcutaneous nodule of abdominal wall 01/09/2022   Partial small bowel obstruction ([place]) 01/07/2022   COVID-19 virus infection 01/07/2022   Colostomy in place [facility]) 11/10/2021   Stage 3a chronic kidney disease (CKD) ([place]) 11/10/2021   Pyelonephritis    Essential hypertension 10/30/2021   Hydronephrosis of left kidney 10/28/2021   Ureteral stricture, left 10/27/2021   Protein-calorie malnutrition, severe 06/12/2021   Hydroureter on right 06/10/2021   Aortic atherosclerosis ([place]) 06/10/2021   Leukocytosis 06/02/2021   Right ureteral stone 06/02/2021   Colon stricture ([place])    Pancreatic mass 06/27/2019   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)M1  with metastatic disease 06/27/2019   Rhinitis medicamentosa 02/26/2019    Palliative Care Assessment & Plan   Patient Profile:    Assessment:  81 year old gentleman who lives at home with his wife in [place], [place].  His daughter lives nearby.  He has a past medical history of stage IV pancreatic cancer, status post colectomy,  hydronephrosis status post nephrostomy, underlying hypertension small bowel obstruction stage II chronic kidney disease.  Patient presented with ongoing abdominal pain, known to have concerns for small bowel obstruction issues.  Underwent a diagnostic laparoscopy with lysis of additions on 2-14.  He is being followed by general surgery and medical oncology.  Has been placed on TPN for nutritional support.  Has been placed on NG tube for suction.  So far, patient has not not had any bowel movements or passed flatus. Palliative medicine team has been consulted for CODE STATUS and goals of care discussions and for additional support.  Recommendations/Plan: DNR Continue current mode of care.  He is invested in plan to continue with current interventions.  He is still hopeful that he will recover enough to receive systemic therapy for his pancreatic cancer.  He places a great deal of confidence in Dr. Antonieta Pert recommendations and appreciate his input greatly. Pain: Discussed with both patient and his bedside RN about his pain medications. Continue to monitor.    Code Status:    Code Status Orders  (From admission, onward)           Start     Ordered   02/06/22 1335  Do not attempt resuscitation (DNR)  Continuous       Question Answer Comment  In the event of cardiac or respiratory ARREST Do not call a code blue   In the event of cardiac or respiratory ARREST Do not perform Intubation, CPR, defibrillation or ACLS   In the event of cardiac or respiratory ARREST Use medication by any route, position, wound care, and other measures to relive pain and suffering. May use oxygen, suction and manual treatment of airway obstruction as needed for comfort.      02/06/22 1334           Code Status History     Date  Active Date Inactive Code Status Order ID Comments User Context   01/29/2022 0014 02/06/2022 1334 Full Code [id number]  Marcelyn Bruins, MD Inpatient   01/07/2022 2129 01/10/2022 2258  Full Code [id number]  Rise Patience, MD Inpatient   12/10/2021 1331 12/11/2021 2137 Full Code [id number]  Jonnie Finner, DO ED   11/10/2021 0354 11/13/2021 2039 Full Code [id number]  Chotiner, Yevonne Aline, MD Inpatient   10/27/2021 2317 10/30/2021 1833 Full Code [id number]  Lenore Cordia, MD Inpatient   06/09/2021 2201 06/18/2021 1923 Full Code [id number]  Lenore Cordia, MD Inpatient   06/01/2021 2102 06/05/2021 1830 Full Code [id number]  Rhetta Mura, DO ED   05/18/2021 1200 05/26/2021 1958 Partial Code [id number]  Jonnie Finner, DO Inpatient   12/12/2014 1730 12/17/2014 1704 Full Code [id number]  Velvet Bathe, MD Inpatient       Prognosis:  Guarded   Discharge Planning: To Be Determined  Care plan was discussed with patient.  Total time: 25 minutes  Thank you for allowing the Palliative Medicine Team to assist in the care of this patient. Loistine Chance, MD  Please contact Palliative Medicine Team phone at [phone] for questions and concerns.

## 2022-02-15 NOTE — Progress Notes (Signed)
Evening abdominal dressing saturated since last changed at 11AM. Drainage purulent and area around wound pink and raised. On call surgeon called and returned call. No new orders at this time. Continue dressing changes as ordered. Area cleansed and re-dressed per orders.

## 2022-02-15 NOTE — Progress Notes (Signed)
ANTICOAGULATION CONSULT NOTE    Pharmacy Consult for IV heparin Indication: DVT  Allergies  Allergen Reactions   Pollen Extract Other (See Comments)    Sinus congestion    Patient Measurements: Height: 5\' 11"  (180.3 cm) Weight: 69.9 kg (154 lb 1.6 oz) IBW/kg (Calculated) : 75.3 Heparin Dosing Weight: actual body weight   Vital Signs: Temp: 98.2 F (36.8 C) (02/28 2003) Temp Source: Oral (02/28 2003) BP: 160/92 (02/28 2003) Pulse Rate: 93 (02/28 2003)  Labs: Recent Labs    02/13/22 7564 02/14/22 0713 02/14/22 0950 02/14/22 1801 02/15/22 0357 02/15/22 1249 02/15/22 2205  HGB 10.4* 10.0*  --   --  10.3*  --   --   HCT 33.7* 32.3*  --   --  33.2*  --   --   PLT 1,086* 1,049*  --   --  1,066*  --   --   APTT  --   --  30  --   --   --   --   LABPROT  --   --  14.9  --   --   --   --   INR  --   --  1.2  --   --   --   --   HEPARINUNFRC  --   --   --    < > 0.15* 0.13* 0.53  CREATININE 1.24 1.15  --   --  1.25*  --   --    < > = values in this interval not displayed.     Estimated Creatinine Clearance: 46.6 mL/min (A) (by C-G formula based on SCr of 1.25 mg/dL (H)).   Medical History: Past Medical History:  Diagnosis Date   Colostomy present [facility])    Large bowel obstruction ([place])    Pancreatic cancer [facility])     Assessment: 68 y/oM with PMH of metastatic pancreatic cancer and history of SBO presented to [facility] ED for abdominal pain, nausea, vomiting found to have partial SBO s/p laparoscopic lysis of adhesions, small bowel resection, excisional bipsy of mesentery mass on 2/14. Underwent left thoracentesis for pleural effusion 2/23, chest tube placed 2/24, intrapleural lytics given 2/25. Today, left lower extremity venous duplex + for LLE DVT. Pharmacy consulted for IV heparin dosing. Hgb low but stable at 10, Pltc elevated at 1049K. Note patient has been ordered Enoxaparin 40mg  SQ q24h for VTE prophylaxis throughout this admission, but patient refused doses 2/24-2/26  (last dose administered 2/23 at 1004).   Today, 02/15/22 - Heparin level 0.13 units/mL, remains subtherapeutic despite bolus and rate increase early this morning  - CBC:  WBC 10.3 (low, but stable), Pltc remains elevated at 1066K - Discussed with RN - no issues with heparin drip infusing and no bleeding reported  PM follow-up: Heparin level = 0.53 (therapeutic) with heparin gtt @ 1800 units/hr No complications of therapy noted  Goal of Therapy:  Heparin level 0.3-0.7 units/ml Monitor platelets by anticoagulation protocol: Yes   Plan:  Continue heparin infusion @ 1800 units/hr Daily CBC, heparin level Monitor closely for s/sx of bleeding   Leone Haven, PharmD 02/15/2022 10:36 PM

## 2022-02-15 NOTE — Progress Notes (Signed)
14 Days Post-Op   Subjective/Chief Complaint: Complains of abd pain   Objective: Vital signs in last 24 hours: Temp:  [97.5 F (36.4 C)-98.5 F (36.9 C)] 97.5 F (36.4 C) (02/28 0703) Pulse Rate:  [76-80] 76 (02/28 0703) Resp:  [16-20] 18 (02/28 0703) BP: (111-133)/(85-95) 130/85 (02/28 0703) SpO2:  [94 %-96 %] 96 % (02/28 0703) Weight:  [69.9 kg] 69.9 kg (02/28 0500) Last BM Date : 02/13/22  Intake/Output from previous day: 02/27 0701 - 02/28 0700 In: 1667.3 [P.O.:360; I.V.:1297.3] Out: 2580 [Urine:2000; Chest Tube:580] Intake/Output this shift: No intake/output data recorded.  General appearance: cachectic and slowed mentation Resp: clear to auscultation bilaterally Cardio: regular rate and rhythm GI: tender over lower abd. Still has pus draining from suprapubic port opening  Lab Results:  Recent Labs    02/14/22 0713 02/15/22 0357  WBC 18.4* 20.2*  HGB 10.0* 10.3*  HCT 32.3* 33.2*  PLT 1,049* 1,066*   BMET Recent Labs    02/14/22 0713 02/15/22 0357  NA 139 141  K 4.0 4.3  CL 109 107  CO2 26 28  GLUCOSE 160* 150*  BUN 46* 48*  CREATININE 1.15 1.25*  CALCIUM 7.9* 8.0*   PT/INR Recent Labs    02/14/22 0950  LABPROT 14.9  INR 1.2   ABG No results for input(s): PHART, HCO3 in the last 72 hours.  Invalid input(s): PCO2, PO2  Studies/Results: DG CHEST PORT 1 VIEW  Result Date: 02/14/2022 CLINICAL DATA:  [place]ema EXAM: PORTABLE CHEST 1 VIEW COMPARISON:  Chest x-ray dated February 13, 2022 FINDINGS: Cardiac and mediastinal contours are unchanged. Bilateral nodular opacities and heterogeneous opacities of left lung, unchanged compared to prior. Small left hydropneumothorax with slightly decreased size of air component. Small right pleural effusion. IMPRESSION: Small left hydropneumothorax with slightly decreased size of air component. Left-sided chest tube in place. Electronically Signed   By: Yetta Glassman M.D.   On: 02/14/2022 12:10   DG CHEST PORT  1 VIEW  Result Date: 02/13/2022 CLINICAL DATA:  Empyema lung EXAM: PORTABLE CHEST 1 VIEW COMPARISON:  02/11/2022 FINDINGS: Left basilar chest tube. Right chest wall port with catheter tip unchanged. Bilateral pulmonary opacities with nodularity are again identified. Overall aeration is slightly worse. Increased lucency at the left costophrenic angle suggesting greater pleural air conjunction with residual effusion. Similar cardiomediastinal contours. IMPRESSION: Bilateral pulmonary opacities with nodularity. Aeration is slightly worse. Increased lucency at the left costophrenic angle likely represents greater pneumothorax component of a hydropneumothorax. Electronically Signed   By: Macy Mis M.D.   On: 02/13/2022 11:39   ECHOCARDIOGRAM COMPLETE BUBBLE STUDY  Result Date: 02/14/2022    ECHOCARDIOGRAM REPORT   Patient Name:   [name] Date of Exam: 02/14/2022 Medical Rec #:  [id number]      Height:       71.0 in Accession #:    [id number]     Weight:       153.9 lb Date of Birth:  05/14/1941      BSA:          1.886 m Patient Age:    70 years       BP:           133/95 mmHg Patient Gender: M              HR:           80 bpm. Exam Location:  Inpatient Procedure: 2D Echo and Saline Contrast Bubble Study Indications:    Pleural effusion. Saline  contrast study requested.  History:        Patient has no prior history of Echocardiogram examinations.  Sonographer:    Arlyss Gandy Referring Phys: [id number] [name]  1. The aortic valve is calcified. There is severe calcifcation of the aortic valve. There is severe thickening of the aortic valve. Aortic valve regurgitation is not visualized. LVOT VTI signal suboptimal and therefore difficult to calculate AVA and DI.  Suspect severe vs moderate-to-severe aortic stenosis. Mean gradient 42mmHg, Vmax 3.94m/s. AVA by rough estimate 0.7cm2, DI 0.22. Consider TEE vs CTA for further evaluation.  2. Left ventricular ejection fraction, by estimation, is  55 to 60%. The left ventricle has normal function. The left ventricle has no regional wall motion abnormalities. There is moderate hypertrophy of the basal septum. The rest of the LV segments demonstrate mild left ventricular hypertrophy. Left ventricular diastolic parameters are consistent with Grade I diastolic dysfunction (impaired relaxation).  3. Right ventricular systolic function is normal. The right ventricular size is normal. There is normal pulmonary artery systolic pressure.  4. The mitral valve is normal in structure. Trivial mitral valve regurgitation.  5. Aortic dilatation noted. There is moderate dilatation of the ascending aorta, measuring 45 mm.  6. The inferior vena cava is normal in size with greater than 50% respiratory variability, suggesting right atrial pressure of 3 mmHg. Comparison(s): No prior Echocardiogram. FINDINGS  Left Ventricle: Left ventricular ejection fraction, by estimation, is 55 to 60%. The left ventricle has normal function. The left ventricle has no regional wall motion abnormalities. The left ventricular internal cavity size was normal in size. There is  moderate hypertrophy of the basal septum. The rest of the LV segments demonstrate mild left ventricular hypertrophy. Left ventricular diastolic parameters are consistent with Grade I diastolic dysfunction (impaired relaxation). Right Ventricle: The right ventricular size is normal. No increase in right ventricular wall thickness. Right ventricular systolic function is normal. There is normal pulmonary artery systolic pressure. The tricuspid regurgitant velocity is 2.81 m/s, and  with an assumed right atrial pressure of 3 mmHg, the estimated right ventricular systolic pressure is 38.1 mmHg. Left Atrium: Left atrial size was normal in size. Right Atrium: Right atrial size was normal in size. Pericardium: There is no evidence of pericardial effusion. Mitral Valve: The mitral valve is normal in structure. Mild mitral annular  calcification. Trivial mitral valve regurgitation. Tricuspid Valve: The tricuspid valve is normal in structure. Tricuspid valve regurgitation is trivial. Aortic Valve: The aortic valve is calcified. There is severe calcifcation of the aortic valve. There is severe thickening of the aortic valve. Aortic valve regurgitation is not visualized. Moderate to severe aortic stenosis is present. Aortic valve mean gradient measures 17.8 mmHg. Aortic valve peak gradient measures 32.6 mmHg. Aortic valve area, by VTI measures 0.97 cm. Pulmonic Valve: The pulmonic valve was not well visualized. Pulmonic valve regurgitation is trivial. Aorta: Aortic dilatation noted. There is moderate dilatation of the ascending aorta, measuring 45 mm. Venous: The inferior vena cava is normal in size with greater than 50% respiratory variability, suggesting right atrial pressure of 3 mmHg. IAS/Shunts: The atrial septum is grossly normal. Agitated saline contrast was given intravenously to evaluate for intracardiac shunting.  LEFT VENTRICLE PLAX 2D LVIDd:         3.40 cm   Diastology LVIDs:         2.40 cm   LV e' medial:    5.11 cm/s LV PW:         1.30  cm   LV E/e' medial:  12.6 LV IVS:        1.50 cm   LV e' lateral:   5.22 cm/s LVOT diam:     2.40 cm   LV E/e' lateral: 12.3 LV SV:         49 LV SV Index:   26 LVOT Area:     4.52 cm  RIGHT VENTRICLE             IVC RV Basal diam:  3.40 cm     IVC diam: 1.20 cm RV Mid diam:    2.90 cm RV S prime:     15.40 cm/s TAPSE (M-mode): 1.9 cm LEFT ATRIUM           Index        RIGHT ATRIUM           Index LA diam:      2.00 cm 1.06 cm/m   RA Area:     10.40 cm LA Vol (A2C): 16.1 ml 8.54 ml/m   RA Volume:   23.70 ml  12.56 ml/m LA Vol (A4C): 24.0 ml 12.72 ml/m  AORTIC VALVE AV Area (Vmax):    0.93 cm AV Area (Vmean):   0.98 cm AV Area (VTI):     0.97 cm AV Vmax:           285.50 cm/s AV Vmean:          187.250 cm/s AV VTI:            0.507 m AV Peak Grad:      32.6 mmHg AV Mean Grad:      17.8  mmHg LVOT Vmax:         58.80 cm/s LVOT Vmean:        40.400 cm/s LVOT VTI:          0.109 m LVOT/AV VTI ratio: 0.21  AORTA Ao Root diam: 3.50 cm Ao Asc diam:  4.50 cm MITRAL VALVE               TRICUSPID VALVE MV Area (PHT): 2.36 cm    TR Peak grad:   31.6 mmHg MV Decel Time: 322 msec    TR Vmax:        281.00 cm/s MV E velocity: 64.30 cm/s MV A velocity: 80.80 cm/s  SHUNTS MV E/A ratio:  0.80        Systemic VTI:  0.11 m                            Systemic Diam: 2.40 cm Gwyndolyn Kaufman MD Electronically signed by Gwyndolyn Kaufman MD Signature Date/Time: 02/14/2022/5:22:27 PM    Final    VAS [place] LOWER EXTREMITY VENOUS (DVT)  Result Date: 02/14/2022  Lower Venous DVT Study Patient Name:  Kevin Villegas  Date of Exam:   02/14/2022 Medical Rec #: [id number]       Accession #:    [id number] Date of Birth: October 30, 1941       Patient Gender: M Patient Age:   44 years Exam Location:  [facility] Procedure:      VAS [place] LOWER EXTREMITY VENOUS (DVT) Referring Phys: [employer] --------------------------------------------------------------------------------  Indications: Edema.  Comparison Study: No prior study Performing Technologist: Maudry Mayhew MHA, RDMS, RVT, RDCS  Examination Guidelines: A complete evaluation includes B-mode imaging, spectral Doppler, color Doppler, and power Doppler as needed of all accessible portions of each vessel. Bilateral  testing is considered an integral part of a complete examination. Limited examinations for reoccurring indications may be performed as noted. The reflux portion of the exam is performed with the patient in reverse Trendelenburg.  +-----+---------------+---------+-----------+----------+--------------+  RIGHT Compressibility Phasicity Spontaneity Properties Thrombus Aging  +-----+---------------+---------+-----------+----------+--------------+  CFV   Full            Yes       Yes                                     +-----+---------------+---------+-----------+----------+--------------+   +---------+---------------+---------+-----------+----------+-----------------+  LEFT      Compressibility Phasicity Spontaneity Properties Thrombus Aging     +---------+---------------+---------+-----------+----------+-----------------+  CFV       Full            No        Yes                                       +---------+---------------+---------+-----------+----------+-----------------+  SFJ       Full                                                                +---------+---------------+---------+-----------+----------+-----------------+  FV Prox   Full                                                                +---------+---------------+---------+-----------+----------+-----------------+  FV Mid    None                      No                     Acute              +---------+---------------+---------+-----------+----------+-----------------+  FV Distal None                      No                     Acute              +---------+---------------+---------+-----------+----------+-----------------+  PFV       Full                                                                +---------+---------------+---------+-----------+----------+-----------------+  POP       Full            No        Yes                                       +---------+---------------+---------+-----------+----------+-----------------+  PTV       None            Yes       Yes                    Age Indeterminate  +---------+---------------+---------+-----------+----------+-----------------+  PERO      None                      No                     Acute              +---------+---------------+---------+-----------+----------+-----------------+    Summary: RIGHT: - No evidence of common femoral vein obstruction.  LEFT: - Findings consistent with acute deep vein thrombosis involving the left femoral vein, and left peroneal veins. - Findings consistent with  age indeterminate deep vein thrombosis involving the left peroneal veins.  *See table(s) above for measurements and observations. Electronically signed by Servando Snare MD on 02/14/2022 at 10:42:12 AM.    Final     Anti-infectives: Anti-infectives (From admission, onward)    Start     Dose/Rate Route Frequency Ordered Stop   02/14/22 1015  linezolid (ZYVOX) tablet 600 mg        600 mg Oral Every 12 hours 02/14/22 0929     02/11/22 1330  linezolid (ZYVOX) IVPB 600 mg  Status:  Discontinued        600 mg 300 mL/hr over 60 Minutes Intravenous Every 12 hours 02/11/22 1234 02/14/22 0929   02/04/22 1545  piperacillin-tazobactam (ZOSYN) IVPB 3.375 g        3.375 g 12.5 mL/hr over 240 Minutes Intravenous Every 8 hours 02/04/22 1453 02/09/22 1359   02/01/22 2200  ceFAZolin (ANCEF) IVPB 1 g/50 mL premix  Status:  Discontinued        1 g 100 mL/hr over 30 Minutes Intravenous Every 8 hours 02/01/22 1504 02/04/22 1453   02/01/22 0930  cefoTEtan (CEFOTAN) 2 g in sodium chloride 0.9 % 100 mL IVPB        2 g 200 mL/hr over 30 Minutes Intravenous On call to O.R. 01/31/22 1908 02/01/22 1533   01/30/22 1000  cefTRIAXone (ROCEPHIN) 1 g in sodium chloride 0.9 % 100 mL IVPB  Status:  Discontinued        1 g 200 mL/hr over 30 Minutes Intravenous Every 24 hours 01/29/22 0013 02/01/22 1504   01/28/22 1830  cefTRIAXone (ROCEPHIN) 1 g in sodium chloride 0.9 % 100 mL IVPB        1 g 200 mL/hr over 30 Minutes Intravenous  Once 01/28/22 1828 01/28/22 1927       Assessment/Plan: s/p Procedure(s): LAPAROSCOPIC LYSIS OF ADHESIONS (N/A) SMALL BOWEL RESECTION (N/A) Continue IV abx Stage IV pancreatic cancer with psbo  - POD#14 s/p Lap LOA, Small bowel resection, excisional biopsy of mesentery mass by Dr. 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 Decrease in left pleural effusion after chest tube placement - WBC trending down, 18.4 from 24 - ID consulted and oncology following. Likely from empyema and abdominal wall abscess. Continue packing suprapubic port site - worsening n/v. Back diet down to CLD. Continue TPN   FEN: TPN, CLD ID:  cefotetan periop, ancef (UTI), zosyn 2/17>2/21. Linezolid 2/24>> VTE: lovenox   Left empyema s/p chest tube E coli UTI - completed abx CKD 3A HTN Code status DNR - palliative following Severe protein calorie malnutrition Overall he seems to be a little worse. No option for further surgery at this point. Will follow  LOS: 17 days    Autumn Messing III 02/15/2022

## 2022-02-15 NOTE — Progress Notes (Signed)
Patient more confused today, does not answer A&O questions- told RN, "don't start that again." Appears to be in more pain also. Told RN, "All of my insides are hurting." Guarding stomach and grimacing; grimaces and moans with any touch or movement- PRN Fentanyl given with some relief. Pt reached his hand out with his eyes closed while RN was charting in his room. Told RN, "I just want to touch somebody." Pt held RN's hand close to his heart for several minutes while RN charted. Will continue to monitor closely.

## 2022-02-16 ENCOUNTER — Inpatient Hospital Stay (HOSPITAL_COMMUNITY): Payer: Medicare Other

## 2022-02-16 DIAGNOSIS — J869 Pyothorax without fistula: Secondary | ICD-10-CM | POA: Diagnosis not present

## 2022-02-16 DIAGNOSIS — K56600 Partial intestinal obstruction, unspecified as to cause: Secondary | ICD-10-CM | POA: Diagnosis not present

## 2022-02-16 LAB — COMPREHENSIVE METABOLIC PANEL
ALT: 40 U/L (ref 0–44)
AST: 45 U/L — ABNORMAL HIGH (ref 15–41)
Albumin: <1.5 g/dL — ABNORMAL LOW (ref 3.5–5.0)
Alkaline Phosphatase: 191 U/L — ABNORMAL HIGH (ref 38–126)
Anion gap: 6 (ref 5–15)
BUN: 46 mg/dL — ABNORMAL HIGH (ref 8–23)
CO2: 27 mmol/L (ref 22–32)
Calcium: 7.8 mg/dL — ABNORMAL LOW (ref 8.9–10.3)
Chloride: 107 mmol/L (ref 98–111)
Creatinine, Ser: 1.02 mg/dL (ref 0.61–1.24)
GFR, Estimated: >60 mL/min (ref >60)
Glucose, Bld: 132 mg/dL — ABNORMAL HIGH (ref 70–99)
Potassium: 4.1 mmol/L (ref 3.5–5.1)
Sodium: 140 mmol/L (ref 135–145)
Total Bilirubin: 0.1 mg/dL — ABNORMAL LOW (ref 0.3–1.2)
Total Protein: 5.5 g/dL — ABNORMAL LOW (ref 6.5–8.1)

## 2022-02-16 LAB — CBC WITH DIFFERENTIAL/PLATELET
Abs Immature Granulocytes: 0.61 10*3/uL — ABNORMAL HIGH (ref 0.00–0.07)
Basophils Absolute: 0.1 10*3/uL (ref 0.0–0.1)
Basophils Relative: 1 %
Eosinophils Absolute: 0.6 10*3/uL — ABNORMAL HIGH (ref 0.0–0.5)
Eosinophils Relative: 3 %
HCT: 29.7 % — ABNORMAL LOW (ref 39.0–52.0)
Hemoglobin: 9.3 g/dL — ABNORMAL LOW (ref 13.0–17.0)
Immature Granulocytes: 3 %
Lymphocytes Relative: 11 %
Lymphs Abs: 2.3 10*3/uL (ref 0.7–4.0)
MCH: 30.1 pg (ref 26.0–34.0)
MCHC: 31.3 g/dL (ref 30.0–36.0)
MCV: 96.1 fL (ref 80.0–100.0)
Monocytes Absolute: 1.7 10*3/uL — ABNORMAL HIGH (ref 0.1–1.0)
Monocytes Relative: 8 %
Neutro Abs: 15.7 10*3/uL — ABNORMAL HIGH (ref 1.7–7.7)
Neutrophils Relative %: 74 %
Platelets: 1053 10*3/uL (ref 150–400)
RBC: 3.09 MIL/uL — ABNORMAL LOW (ref 4.22–5.81)
RDW: 17.7 % — ABNORMAL HIGH (ref 11.5–15.5)
WBC: 21 10*3/uL — ABNORMAL HIGH (ref 4.0–10.5)
nRBC: 0.1 % (ref 0.0–0.2)

## 2022-02-16 LAB — CYTOLOGY - NON PAP

## 2022-02-16 LAB — GLUCOSE, CAPILLARY
Glucose-Capillary: 107 mg/dL — ABNORMAL HIGH (ref 70–99)
Glucose-Capillary: 118 mg/dL — ABNORMAL HIGH (ref 70–99)
Glucose-Capillary: 118 mg/dL — ABNORMAL HIGH (ref 70–99)
Glucose-Capillary: 119 mg/dL — ABNORMAL HIGH (ref 70–99)
Glucose-Capillary: 119 mg/dL — ABNORMAL HIGH (ref 70–99)
Glucose-Capillary: 132 mg/dL — ABNORMAL HIGH (ref 70–99)

## 2022-02-16 LAB — HEPARIN LEVEL (UNFRACTIONATED): Heparin Unfractionated: 0.46 IU/mL (ref 0.30–0.70)

## 2022-02-16 MED ORDER — LINEZOLID 600 MG/300ML IV SOLN
600.0000 mg | Freq: Two times a day (BID) | INTRAVENOUS | Status: DC
  Administered 2022-02-16 – 2022-02-21 (×11): 600 mg via INTRAVENOUS
  Filled 2022-02-16 (×13): qty 300

## 2022-02-16 MED ORDER — TRAVASOL 10 % IV SOLN
INTRAVENOUS | Status: DC
  Filled 2022-02-16: qty 1122

## 2022-02-16 MED ORDER — TRAVASOL 10 % IV SOLN
INTRAVENOUS | Status: AC
  Filled 2022-02-16: qty 1122

## 2022-02-16 NOTE — Progress Notes (Signed)
PHARMACY - TOTAL PARENTERAL NUTRITION CONSULT NOTE  ? ?Indication: Small bowel obstruction and inability to meet nutritional needs w/ PO intake ? ?Patient Measurements: ?Height: 5' 11"  (180.3 cm) ?Weight: 67.4 kg (148 lb 9.4 oz) ?IBW/kg (Calculated) : 75.3 ?TPN AdjBW (KG): 57.7 ?Body mass index is 20.72 kg/m?. ?Usual Weight: Weight noted in November 2022 to be 63.5 kg ? ?Assessment: 81 yo male presenting to [facility] ED for abdominal pain, nausea, vomiting. PMH includes pancreatic cancer with evidence of metastatic disease to his bladder, lungs, and pleural space and history of SBO.  S/p stent placement between stomach and intestines and colostomy placement in June 2022. Pharmacy is consulted for TPN management for SBO and inability to meet nutritional needs w/ PO intake.  ? ?Glucose / Insulin: No hx DM; CBGs at goal on moderate SSI q6h ?- 0 units SSI used in past 24 hours ?Electrolytes: All, including Corrected Calcium, WNL ?Renal: SCr improved to WNL, BUN elevated but decreased to 46; UOP remains adequate ?Hepatic: AST elevated, but trending down. ALT improved to WNL. Alk Phos elevated, but trending down. Tbili low. Albumin low. Prealbumin low but improved to 11.6 (2/28). TG WNL, 130 (2/27) ?Intake / Output; MIVF: I/O 2537/1640; no IVF ?-Chest tube output/drainage decreased to 268m ?-UOP 17042m?-Oral intake charted: 11085miquids  ?GI Imaging: ?- 2/10 CT abdomen pelvis: re-demonstration of high-grade partial SBO with transition point in the upper left pelvis psych likely due to adhesion.  ?- 2/20 abd CT: No evidence of postoperative intra-abdominal abscess. BL pleural effusions. Pulmonary metastatic disease in the lung bases with multiple pulmonary nodules. ?- 2/25 CT: worsened SB loop dilation suggesting ileus or pSBO ?GI Surgeries / Procedures:  ?- 2/14: laparoscopic lysis of adhesions, small bowel resection, excisional bipsy of mesentery mass  ? ?Central access: PAC ?TPN start date: 2/12 ? ?Nutritional Goals: ?Goal  TPN rate is 85 mL/hr (provides 112g of protein and 2061 kcals per day) ? ?RD Assessment:  ?Estimated Needs ?Total Energy Estimated Needs: 2050-2250 kcal ?Total Protein Estimated Needs: 105-120 grams ?Total Fluid Estimated Needs: >/= 2 L/day ? ?Current Nutrition:  ?Clear liquid diet > full liquid diet ?Boost Breeze TID (accepted 1 on 2/28 PM, refused all others) ?Ensure Plus BID (currently refusing) ?TPN ? ?Plan:  ?At 1800: ?- Continue full-rate TPN at 85 ml/hr ?- Electrolytes in TPN: no change from yesterday ?Na 100m64m ?K 30mE63m?Ca 0mEq/59mMg 5 mEq/L ?Phos 10mmol29mCl:Ac max Ac ?- Add trace elements daily to TPN. Will add multivitamins M/W/F only in TPN due to national shortage.    ?- Continue moderate SSI q6h and adjust as needed ?- TPN labs on Mon/Thurs ?- f/u ability to advance diet and wean TPN when able ? ? ?Kevin Boutelle GLindell Villegas, BCPS ?Clinical Pharmacist  ?02/16/2022, 7:26 AM ? ?

## 2022-02-16 NOTE — Progress Notes (Signed)
Physical Therapy Treatment ?Patient Details ?Name: Kevin Villegas ?MRN: [id number] ?DOB: 1941/01/14 ?Today's Date: 02/16/2022 ? ? ?History of Present Illness Patient is a 81 year old male who presented on 01/28/22 with 2-3 day history of abdominal pain. patient was found to have SBO. patient underwent diagnostic laparoscopy with lysis of adhesions 2/14. New LLE DVT 2/27. PMH:HTN, stage 4 pancreatic cancer,CKD III, status post colectomy, hydronephrosis status post nephrostomy. ? ?  ?PT Comments  ? ? Pt limited by pain, requiring heavy encouragement and cues to mobilize with therapy. Pt requires mod A+2 with bed mobility, tolerates sitting EOB ~8  minutes with heavy encouragement. Pt declines standing or ambulation due to pain in rib cage. VC for sequencing with mobility, pt requires increased time and cues. Progress limited due to pain, plan to continue progressing as able. ?   ?Recommendations for follow up therapy are one component of a multi-disciplinary discharge planning process, led by the attending physician.  Recommendations may be updated based on patient status, additional functional criteria and insurance authorization. ? ?Follow Up Recommendations ? Skilled nursing-short term rehab (<3 hours/day) ?  ?  ?Assistance Recommended at Discharge Frequent or constant Supervision/Assistance  ?Patient can return home with the following A lot of help with walking and/or transfers;A lot of help with bathing/dressing/bathroom;Assistance with cooking/housework;Assist for transportation ?  ?Equipment Recommendations ? Rolling walker (2 wheels)  ?  ?Recommendations for Other Services   ? ? ?  ?Precautions / Restrictions Precautions ?Precautions: Fall ?Precaution Comments: abdominal wounds, ostomy, nephrostomy, colostomy, chest tube to L chest wall to wall suction, LLE DVT on heparin ?Restrictions ?Weight Bearing Restrictions: No  ?  ? ?Mobility ? Bed Mobility ?Overal bed mobility: Needs Assistance ?Bed Mobility: Rolling,  Supine to Sit, Sit to Supine ?Rolling: Mod assist, +2 for physical assistance ? Supine to sit: Mod assist, +2 for physical assistance ?Sit to supine: Mod assist, +2 for physical assistance ? General bed mobility comments: mod A+2 for supine<>sit for BLE and trunk management, increased time with cues for log roll to protect abdomen; mod A+2 for rolling, pt limited by pain, able to reach with BUE with cues ?  ? ?Transfers ? General transfer comment: pt declines despite heavy encouragement and education due to pain ?  ? ?Ambulation/Gait ?   ? ? ?Stairs ?  ?  ?  ?  ?  ? ? ?Wheelchair Mobility ?  ? ?Modified Rankin (Stroke Patients Only) ?  ? ? ?  ?Balance Overall balance assessment: Needs assistance ?Sitting-balance support: Feet supported ?Sitting balance-Leahy Scale: Fair ? Standing balance comment: pt declines attempt ?  ?  ?Cognition Arousal/Alertness: Awake/alert ?Behavior During Therapy: [facility] for tasks assessed/performed ?Overall Cognitive Status: Within Functional Limits for tasks assessed ?Area of Impairment: Following commands, Problem solving ? Following Commands: Follows one step commands with increased time ? Problem Solving: Requires verbal cues, Requires tactile cues ?General Comments: pt somewhat tired, able to arouse and maintain eyes opened when cued, requires increased time with cues and requires cues for sequencing ?  ?  ? ?  ?Exercises   ? ?  ?General Comments   ?  ?  ? ?Pertinent Vitals/Pain Pain Assessment ?Pain Assessment: Faces ?Faces Pain Scale: Hurts even more ?Pain Location: "the whole rib cage" ?Pain Descriptors / Indicators: Discomfort, Sore ?Pain Intervention(s): Limited activity within patient's tolerance, Monitored during session, Repositioned, Premedicated before session  ? ? ?Home Living   ?  ?  ?  ?  ?  ?  ?  ?  ?  ?   ?  ?  Prior Function    ?  ?  ?   ? ?PT Goals (current goals can now be found in the care plan section) Acute Rehab PT Goals ?Patient Stated Goal: decrease pain ?PT Goal  Formulation: With patient ?Time For Goal Achievement: 03/02/22 ?Potential to Achieve Goals: Fair ?Progress towards PT goals: Not progressing toward goals - comment (limited by pain) ? ?  ?Frequency ? ? ? Min 3X/week ? ? ? ?  ?PT Plan Current plan remains appropriate  ? ? ?Co-evaluation PT/OT/SLP Co-Evaluation/Treatment: Yes ?Reason for Co-Treatment: For patient/therapist safety;To address functional/ADL transfers ?PT goals addressed during session: Mobility/safety with mobility;Balance ?  ?  ? ?  ?AM-PAC PT "6 Clicks" Mobility   ?Outcome Measure ? Help needed turning from your back to your side while in a flat bed without using bedrails?: A Lot ?Help needed moving from lying on your back to sitting on the side of a flat bed without using bedrails?: A Lot ?Help needed moving to and from a bed to a chair (including a wheelchair)?: Total ?Help needed standing up from a chair using your arms (e.g., wheelchair or bedside chair)?: Total ?Help needed to walk in hospital room?: Total ?Help needed climbing 3-5 steps with a railing? : Total ?6 Click Score: 8 ? ?  ?End of Session   ?Activity Tolerance: Patient limited by pain ?Patient left: in bed;with call bell/phone within reach;with bed alarm set ?Nurse Communication: Mobility status ?PT Visit Diagnosis: Other abnormalities of gait and mobility (R26.89);Muscle weakness (generalized) (M62.81) ?  ? ? ?Time: 6979-4801 ?PT Time Calculation (min) (ACUTE ONLY): 23 min ? ?Charges:  $Therapeutic Activity: 8-22 mins          ?          ? ? ?Talbot Grumbling PT, DPT ?02/16/22, 12:55 PM ? ? ? ?

## 2022-02-16 NOTE — Plan of Care (Signed)
?  Problem: Clinical Measurements: Goal: Ability to maintain clinical measurements within normal limits will improve Outcome: Progressing Goal: Respiratory complications will improve Outcome: Progressing   Problem: Safety: Goal: Ability to remain free from injury will improve Outcome: Progressing   

## 2022-02-16 NOTE — Progress Notes (Signed)
?   ? ? ? ? ?[facility] ? ?Date of Admission:  01/28/2022   Total days of inpatient antibiotics 12 ? ?Principal Problem: ?  Partial small bowel obstruction ([place]) ?Active Problems: ?  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  Leukocytosis ?  Protein-calorie malnutrition, severe ?  Hydronephrosis of left kidney ?  Essential hypertension ?  Colostomy in place [facility]) ?  Stage 3a chronic kidney disease (CKD) ([place]) ?  E-coli UTI ?  Goals of care, counseling/discussion ?  Empyema of left pleural space (HCC) ?  Empyema lung ([place]) ?  Leg DVT (deep venous thromboembolism), acute, left ([place]) ?  Pressure injury of skin ?  Thrombocytosis ?  Acute metabolic encephalopathy ?     ?    ?Assessment: ?81YM with metastatic pancreatic cancer admitted for recurrent small bowel obstruction underwent LAP on 2/14. Hospital course complicated by [facility] UTI(treated with pip-tazo). Pt had increased wbc despite antibiotics with enlarging pleural effusion.  ? ?#Left sided empyema with VRE SP chest tube placement on 2/24 ?#Pancreatic Cancer with metastatic disease with psbo ?#Left leg thrombus now on heparin ?-SP thoracentesis on 2/23 with Cx+ E faecium (VRE). Cytology did not show malignant cell ?-Palliative care following ?-TTE showed thickened aortic valve. As such will order TEE. 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 As such will wait on TEE to see if antibiotic regimen regimen needs to changed.  ?Recommendations: ?-Continue  linezolid to PO. Will likely need atleast 4 weeks of antibiotics. Additional sensitivities pending(would like to avoid linezolid past 2 weeks of treatment due to side effect profile) ?-Continue to monitor clinically ?-TEE scheduled for Monday 02/21/22 ?-Palliative following ?Microbiology:    ?Antibiotics: ?Linezolid 2/24-p ?Pip-tazo 2/17-2/21 ?Cefazolin 2/14-2/17 ?Ceftriaxone 2/13 ?Cefotetan 2/14 ?Cultures: ?Blood ?2/17 NG ?Other ?Thoracentesis on 2/23 VRE E facium ? ?SUBJECTIVE: ?Resting bed. No new complaints.  ?Interval: Afebrile overnight. Wbc 21K.  ?Review of Systems: ?Review of Systems  ?All other systems reviewed and are negative. ? ? ?Scheduled Meds: ? Chlorhexidine Gluconate Cloth  6 each Topical Daily  ? dronabinol  5 mg Oral BID AC  ? feeding supplement  1 Container Oral TID BM  ? feeding supplement  237 mL Oral BID BM  ? insulin aspart  0-15 Units Subcutaneous Q6H  ? lidocaine  1 patch Transdermal Q24H  ? lip balm  1 application Topical BID  ? methocarbamol  500 mg Oral QID  ? OLANZapine  2.5 mg Oral QHS  ? sodium chloride flush  10 mL Other Q8H  ? sodium chloride flush  3 mL Intravenous Q12H  ? ?Continuous Infusions: ? sodium chloride Stopped (01/28/22 2301)  ? heparin 1,800 Units/hr (02/16/22 7564)  ? linezolid (ZYVOX) IV    ? TPN ADULT (ION) 85 mL/hr at 02/15/22 1723  ? TPN ADULT (ION)    ? ?PRN Meds:.sodium chloride, acetaminophen, alum & mag hydroxide-simeth, antiseptic oral rinse, diphenhydrAMINE **OR** diphenhydrAMINE, fentaNYL (SUBLIMAZE) injection, iohexol, menthol-cetylpyridinium, ondansetron (ZOFRAN) IV, oxyCODONE, phenol, prochlorperazine, simethicone, sodium chloride flush ?Allergies  ?Allergen Reactions  ? Pollen Extract Other (See Comments)  ?  Sinus congestion  ? ? ?OBJECTIVE: ?Vitals:  ? 02/15/22 1142 02/15/22 2003 02/16/22 0500 02/16/22 0616  ?BP: (!) 145/93 (!) 160/92  135/88  ?Pulse: 81 93  73  ?Resp: 16 20  20   ?Temp: 98.1 ?  F (36.7 ?C) 98.2 ?F (36.8 ?C)  98.4 ?F (36.9 ?C)  ?TempSrc: Oral Oral  Oral  ?SpO2: 98% (!) 89%  95%  ?Weight:   67.4 kg   ?Height:      ? ?Body mass index is 20.72 kg/m?. ? ?Physical Exam ?Constitutional:   ?   General: He is not in acute distress. ?   Appearance: He is normal weight. He is not toxic-appearing.  ?HENT:  ?   Head: Normocephalic and  atraumatic.  ?   Right Ear: External ear normal.  ?   Left Ear: External ear normal.  ?   Nose: No congestion or rhinorrhea.  ?   Mouth/Throat:  ?   Mouth: Mucous membranes are moist.  ?   Pharynx: Oropharynx is clear.  ?Eyes:  ?   Extraocular Movements: Extraocular movements intact.  ?   Conjunctiva/sclera: Conjunctivae normal.  ?   Pupils: Pupils are equal, round, and reactive to light.  ?Cardiovascular:  ?   Rate and Rhythm: Normal rate and regular rhythm.  ?   Heart sounds: No murmur heard. ?  No friction rub. No gallop.  ?Pulmonary:  ?   Effort: Pulmonary effort is normal.  ?   Breath sounds: Normal breath sounds.  ?   Comments: Left chest tube in place ?Abdominal:  ?   General: Abdomen is flat. Bowel sounds are normal.  ?   Palpations: Abdomen is soft.  ?Musculoskeletal:     ?   General: No swelling. Normal range of motion.  ?   Cervical back: Normal range of motion and neck supple.  ?Skin: ?   General: Skin is warm and dry.  ?Neurological:  ?   General: No focal deficit present.  ?   Mental Status: He is oriented to person, place, and time.  ?Psychiatric:     ?   Mood and Affect: Mood normal.  ? ? ? ? ?Lab Results ?Lab Results  ?Component Value Date  ? WBC 21.0 (H) 02/16/2022  ? HGB 9.3 (L) 02/16/2022  ? HCT 29.7 (L) 02/16/2022  ? MCV 96.1 02/16/2022  ? PLT 1,053 ([place]) 02/16/2022  ?  ?Lab Results  ?Component Value Date  ? CREATININE 1.02 02/16/2022  ? BUN 46 (H) 02/16/2022  ? NA 140 02/16/2022  ? K 4.1 02/16/2022  ? CL 107 02/16/2022  ? CO2 27 02/16/2022  ?  ?Lab Results  ?Component Value Date  ? ALT 40 02/16/2022  ? AST 45 (H) 02/16/2022  ? ALKPHOS 191 (H) 02/16/2022  ? BILITOT 0.1 (L) 02/16/2022  ?  ? ? ? ? ?Laurice Record, MD ?[facility] ?[facility] ?02/16/2022, 1:54 PM  ?

## 2022-02-16 NOTE — Progress Notes (Signed)
PROGRESS NOTE Kevin Villegas  KGY:[id number] DOB: 01/08/1941 DOA: 01/28/2022 PCP: Dettinger, [name], MD   Brief Narrative/Hospital Course: 81 year old male with stage IV pancreatic cancer status post colectomy, hydronephrosis status post left nephrostomy, hypertension, SBO, CKD stage III who presented with abdominal pain, found to have small bowel obstruction status post diagnostic laparoscopy with lysis of adhesion on 2/40 measuring NG suction n.p.o. while waiting for return of bowel function.    complicated by coffee-ground colored secretion from NG tube, leukocytosis. -Repeat CT scan 2/20 no bowel obstruction, no abscess moderate amount of free fluid in the right upper quadrant, body wall edema noted to the right anterior wall. With persistent leukocytosis but afebrile, being managed with TPN, IV antibiotics-.   Patient completed IV Zosyn 2/17-2/22.   Diet was slowly advanced NG tube removed at this time patient is tolerating diet having output in colostomy bag, having output in nephrostomy tube.    CT scan 2/21 no intra-abdominal abscess or obstruction but shows enlarging left pleural effusion.   ID consulted, underwent left pleural diagnostic tap with 50 mL appears exudative fluid but Gram stain negative, total nucleated cells 25,000, neutrophils100  2/24:placed on Zyvox and underwent small bore chest tube placement by PCCM, lytics done 2/25. 2/26-chest tube placed to suction. 2/27 lower leg edema noted more on left-left duplex ordered came back positive for DVT and placed on heparin drip Ongoing GOC discussion by palliative care-continue with current DNR and full scope of treatment.    Subjective: Awake coherent  Sat oob some today  Assessment and Plan: * Partial small bowel obstruction ([place])- (present on admission) Stage IV pancreatic cancer with partial SBO s/p  Surgery CT 2/25 with small abscess in the abdominal wall Persistent abdominal pain distention nausea vomiting: S/p  LOA, SBR,biopsy 2/14- positive for ACC with mucinous features of left colon mesentery and jejunum. Off NG tube 2/21, S/P Zosyn  2/17>>2/22. CT abdomen 2/25-small abscess in the abdominal wall-on wound care--not a surgery candidate? Colostomy having output  Has persistent abdominal pain distention and nausea vomiting-downgrade to clear liquid diet from soft 2/27, on TPN. Surgery following closely-he is little worse. When should we re-image?  Empyema of left pleural space (HCC) S/P thick purulent drainage- 50 ml by IR 2/23- grew vanc resistant Enterococcus, likely seeding previously known loculated left effusion S/p small bore chest tube placement 2/24 Dr Elsworth Soho + lytics 2/25-Not a candidate for surgery. on Zyvox 2/24 change to PO 2/27> plan to switch to Doxy after 2 weeks-needs at least 4 weeks of antibiotics --TEE scheduled 02/21/22  Fluid cytology is negative.  Continue chest tube suction and once drainage decreases can discontinue chest tube-PCCM managing  Acute metabolic encephalopathy intermittent mild confusion, multifactorial with deconditioning multiple complex comorbidities.    Communicative.  Continue supportive care fall precaution ambulate.  Thrombocytosis likely reactive.  See above  Leg DVT (deep venous thromboembolism), acute, left (HCC) Duplex of the left leg + for acute DVT in left mid and distal femoral vein, posterior tibial vein and peroneal vein.  This in the setting of his metastatic cancer, postop status immobility despite being on Lovenox prophylaxis. Started heparin 2/27 Eventual life-long lovenox.  E-coli UTI Completed antibiotics   Stage 3a chronic kidney disease (CKD) ([place])- (present on admission) AKI with creatinine at 1.59 improved to 1.1-1.2 with ivf, tpn.  Trend  Colostomy in place [facility]) See #1  Essential hypertension- (present on admission) Controlled, not on meds.  Hydronephrosis of left kidney- (present on admission) Due to tumor  compression,  s/p  left nephrostomy last changed on 1/30, renal ultrasound on 2/17  -chronic right hydronephrosis with atrophic right kidney, urology advised outpatient follow-up.  Nephrostomy draining well  Protein-calorie malnutrition, severe- (present on admission) Augment nutritional status as tolerated.  Dietitian on board.  On Soft diet/TPN and wean TPN as tolerated Nutrition Problem: Severe Malnutrition Etiology: chronic illness, cancer and cancer related treatments Signs/Symptoms: severe fat depletion, severe muscle depletion, percent weight loss Interventions: TPN  Leukocytosis Thrombocytosis: Peaked to 38k-trended down to 18 K but now uptrending, afebrile.  Also has thrombocytosis which is most likely reactive.  On Zyvox.  Leukocytosis is  multifactorial due to patient's empyema and abdominal wall abscess. We may need to re-image his abdomen if he continues to have pain--although not ideal surgical candidate  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)M1  with metastatic disease- (present on admission) See #1.Oncology on board.  Dr. Marin Olp following less likely will be candidate for systemic chemotherapy-oncology to discuss goals of care/prognosis with family I spoke with Dr. Marin Olp this morning  DVT prophylaxis: SCDs Start: 01/29/22 0013. On heparin gtt since 2/27 Code Status:   Code Status: DNR Family Communication:plan of care discussed with patient/wife was updated previously.Palliative care met with  FAMILY 2/27.discussed with Dr. Marin Olp today.  Goals of care, counseling/discussion Patient with metastatic pancreatic cancer now with complicated course and hospitalization appears ill deconditioned with failure to thrive.Remains DNR.  family hoping to hear overall prognosis goals of care plan from oncology.   Disposition:Currently not medically stable for discharge. Status SJ:[id number] Remains Inpatient appropriate because: ongoing management of SBO/empyema   Objective: Vitals last 24  hrs: Vitals:   02/15/22 2003 02/16/22 0500 02/16/22 0616 02/16/22 1447  BP: (!) 160/92  135/88 (!) 147/88  Pulse: 93  73 78  Resp: _0 Temp: 98.2 F (36.8 C)  98.4 F (36.9 C) 98.1 F (36.7 C)  TempSrc: Oral  Oral Oral  SpO2: (!) 89%  95% 97%  Weight:  67.4 kg    Height:       Weight change: -2.5 kg  Physical Examination:  Frail cachectic WM in nad--he is coherent Bitemproal supraclav wasting S1 s2 mildy tachy Ctab no rales no rhonchi Chest tube on L side in place--draining ~ 220 so far today Abd soft with Ostomy--slight tender lower L quadrant No Le edema  Medications reviewed:  Scheduled Meds:  Chlorhexidine Gluconate Cloth  6 each Topical Daily   dronabinol  5 mg Oral BID AC   feeding supplement  1 Container Oral TID BM   feeding supplement  237 mL Oral BID BM   insulin aspart  0-15 Units Subcutaneous Q6H   lidocaine  1 patch Transdermal Q24H   lip balm  1 application Topical BID   methocarbamol  500 mg Oral QID   OLANZapine  2.5 mg Oral QHS   sodium chloride flush  10 mL Other Q8H   sodium chloride flush  3 mL Intravenous Q12H   Continuous Infusions:  sodium chloride Stopped (01/28/22 2301)   heparin 1,800 Units/hr (02/16/22 7654)   linezolid (ZYVOX) IV 600 mg (02/16/22 1428)   TPN ADULT (ION) 85 mL/hr at 02/15/22 1723   TPN ADULT (ION) 85 mL/hr at 02/16/22 1712    Pressure Injury 02/13/22 Coccyx Posterior Stage 3 -  Full thickness tissue loss. Subcutaneous fat may be visible but bone, tendon or muscle are NOT exposed. pink red shallow open area with yellow center (Active)  02/13/22 (documented for  RN) 1946  °Location: Coccyx  °Location Orientation: Posterior  °Staging: Stage 3 -  Full thickness tissue loss. Subcutaneous fat may be visible but bone, tendon or muscle are NOT exposed.  °Wound Description (Comments): pink red shallow open area with yellow center  °Present on Admission: Yes (per Nicole Atkins RN present on assessment)  ° °Diet  Order   ° °       °  Diet full liquid Room service appropriate? Yes; Fluid consistency: Thin  Diet effective now       °  ° °  °  ° °  °  ° °Nutrition Problem: Severe Malnutrition °Etiology: chronic illness, cancer and cancer related treatments °Signs/Symptoms: severe fat depletion, severe muscle depletion, percent weight loss °Interventions: Boost Breeze, Ensure Enlive (each supplement provides 350kcal and 20 grams of protein), TPN ° ° °Intake/Output Summary (Last 24 hours) at 02/16/2022 1755 °Last data filed at 02/16/2022 0700 °Gross per 24 hour  °Intake 1351.37 ml  °Output 1240 ml  °Net 111.37 ml  ° ° °Net IO Since Admission: 7,539.64 mL [02/16/22 1755]  °Wt Readings from Last 3 Encounters:  °02/16/22 67.4 kg  °01/07/22 63.5 kg  °12/31/21 63.5 kg  °  ° °Unresulted Labs (From admission, onward)  ° °  Start     Ordered  ° 02/16/22 0500  Heparin level (unfractionated)  Daily,   R     °Question:  Specimen collection method  Answer:  Lab=Lab collect  ° 02/15/22 2239  ° 02/13/22 0500  CBC with Differential/Platelet  Daily,   R     °Question:  Specimen collection method  Answer:  Lab=Lab collect  ° 02/12/22 0649  ° 02/10/22 1337  Minimum Inhibitory Conc. (1 Drug)  Once,   R       ° 02/10/22 1337  ° 01/31/22 0500  Comprehensive metabolic panel  (TPN Lab Panel)  Every Mon,Thu (0500),   R (with TIMED occurrences)     °Question:  Specimen collection method  Answer:  Unit=Unit collect  ° 01/30/22 0742  ° 01/31/22 0500  Magnesium  (TPN Lab Panel)  Every Mon,Thu (0500),   R (with TIMED occurrences)     °Question:  Specimen collection method  Answer:  Unit=Unit collect  ° 01/30/22 0742  ° 01/31/22 0500  Phosphorus  (TPN Lab Panel)  Every Mon,Thu (0500),   R (with TIMED occurrences)     °Question:  Specimen collection method  Answer:  Unit=Unit collect  ° 01/30/22 0742  ° 01/31/22 0500  Triglycerides  (TPN Lab Panel)  Every Monday (0500),   R     °Question:  Specimen collection method  Answer:  Unit=Unit collect  ° 01/30/22 0742   ° °  °  ° °  °Data Reviewed: I have personally reviewed following labs and imaging studies °CBC: °Recent Labs  °Lab 02/12/22 °1039 02/13/22 °0623 02/14/22 °0713 02/15/22 °0357 02/16/22 °0358  °WBC 31.2* 24.3* 18.4* 20.2* 21.0*  °NEUTROABS  --  20.2* 15.1* 15.3* 15.7*  °HGB 10.1* 10.4* 10.0* 10.3* 9.3*  °HCT 33.1* 33.7* 32.3* 33.2* 29.7*  °MCV 99.1 96.8 97.0 96.0 96.1  °PLT 866* 1,086* 1,049* 1,066* 1,053*  ° ° °Basic Metabolic Panel: °Recent Labs  °Lab 02/10/22 °0812 02/12/22 °0551 02/13/22 °0623 02/14/22 °0713 02/15/22 °0357 02/16/22 °0358  °NA 143   142 141 142 139 141 140  °K 4.0   4.0 3.8 4.5 4.0 4.3 4.1  °CL 112*   112* 114* 109 109 107 107  °CO2 24     23 23 26 26 28 27  °GLUCOSE 160*   160* 177* 113* 160* 150* 132*  °BUN 51*   50* 55* 52* 46* 48* 46*  °CREATININE 1.23   1.20 1.19 1.24 1.15 1.25* 1.02  °CALCIUM 8.3*   8.2* 8.2* 8.5* 7.9* 8.0* 7.8*  °MG 2.3 2.2  --  2.0 2.1  --   °PHOS 3.6  --   --  3.6 3.5  --   ° ° °GFR: °Estimated Creatinine Clearance: 55.1 mL/min (by C-G formula based on SCr of 1.02 mg/dL). °Liver Function Tests: °Recent Labs  °Lab 02/10/22 °0812 02/13/22 °0623 02/14/22 °0713 02/15/22 °0357 02/16/22 °0358  °AST 102* 83* 70* 60* 45*  °ALT 91* 65* 60* 53* 40  °ALKPHOS 204* 224* 210* 228* 191*  °BILITOT 0.7 0.3 0.3 0.5 0.1*  °PROT 5.8* 5.7* 5.3* 5.7* 5.5*  °ALBUMIN 1.6* <1.5* <1.5* 1.6* <1.5*  ° ° °No results for input(s): LIPASE, AMYLASE in the last 168 hours. °No results for input(s): AMMONIA in the last 168 hours. °Coagulation Profile: °Recent Labs  °Lab 02/14/22 °0950  °INR 1.2  ° ° °Cardiac Enzymes: °No results for input(s): CKTOTAL, CKMB, CKMBINDEX, TROPONINI in the last 168 hours. °BNP (last 3 results) °No results for input(s): PROBNP in the last 8760 hours. °HbA1C: °No results for input(s): HGBA1C in the last 72 hours. °CBG: °Recent Labs  °Lab 02/16/22 °0009 02/16/22 °0609 02/16/22 °0742 02/16/22 °1057 02/16/22 °1344  °GLUCAP 118* 107* 119* 132* 119*  ° ° °Lipid Profile: °Recent Labs  °   02/14/22 °0713  °TRIG 130  ° ° ° °Thyroid Function Tests: °No results for input(s): TSH, T4TOTAL, FREET4, T3FREE, THYROIDAB in the last 72 hours. °Anemia Panel: °Recent Labs  °  02/15/22 °1358  °TIBC 186*  °IRON 32*  ° ° °Sepsis Labs: °Recent Labs  °Lab 02/10/22 °0812 02/11/22 °0607 02/12/22 °0551  °PROCALCITON 1.88 1.41 1.37  ° ° ° ° °Recent Results (from the past 240 hour(s))  °Body fluid culture w Gram Stain     Status: None (Preliminary result)  ° Collection Time: 02/10/22  1:37 PM  ° Specimen: PATH Cytology Pleural fluid  °Result Value Ref Range Status  ° Specimen Description   Final  °  PLEURAL °Performed at [facility], [address] [place], [place] [address] °  ° Special Requests   Final  °  NONE °Performed at [facility], [address] [place], [place] [address] °  ° Gram Stain FEW WBC SEEN °NO ORGANISMS SEEN °  Final  ° Culture   Final  °  MODERATE ENTEROCOCCUS FAECIUM °VANCOMYCIN RESISTANT ENTEROCOCCUS °CRITICAL RESULT CALLED TO, READ BACK BY AND VERIFIED WITH: RN LISA.P AT 1144 ON 02/11/2022 BY T.SAAD. °Sent to [facility] for further susceptibility testing. °Performed at [facility], [address] [place], [place] [address] °  ° Report Status PENDING  Incomplete  ° Organism ID, Bacteria ENTEROCOCCUS FAECIUM  Final  °    Susceptibility  ° Enterococcus faecium - MIC*  °  AMPICILLIN >=32 RESISTANT Resistant   °  VANCOMYCIN >=32 RESISTANT Resistant   °  GENTAMICIN SYNERGY SENSITIVE Sensitive   °  LINEZOLID 2 SENSITIVE Sensitive   °  * MODERATE ENTEROCOCCUS FAECIUM  ° °  °Antimicrobials: °Anti-infectives (From admission, onward)  ° ° Start     Dose/Rate Route Frequency Ordered Stop  ° 02/16/22 1200  linezolid (ZYVOX) IVPB 600 mg       ° 600 mg °300 mL/hr   over 60 Minutes Intravenous Every 12 hours 02/16/22 1115    ° 02/14/22 1015  linezolid (ZYVOX) tablet 600 mg  Status:  Discontinued       ° 600 mg Oral Every 12 hours 02/14/22 0929 02/16/22 1115  ° 02/11/22  1330  linezolid (ZYVOX) IVPB 600 mg  Status:  Discontinued       ° 600 mg °300 mL/hr over 60 Minutes Intravenous Every 12 hours 02/11/22 1234 02/14/22 0929  ° 02/04/22 1545  piperacillin-tazobactam (ZOSYN) IVPB 3.375 g       ° 3.375 g °12.5 mL/hr over 240 Minutes Intravenous Every 8 hours 02/04/22 1453 02/09/22 1359  ° 02/01/22 2200  ceFAZolin (ANCEF) IVPB 1 g/50 mL premix  Status:  Discontinued       ° 1 g °100 mL/hr over 30 Minutes Intravenous Every 8 hours 02/01/22 1504 02/04/22 1453  ° 02/01/22 0930  cefoTEtan (CEFOTAN) 2 g in sodium chloride 0.9 % 100 mL IVPB       ° 2 g °200 mL/hr over 30 Minutes Intravenous On call to O.R. 01/31/22 1908 02/01/22 1533  ° 01/30/22 1000  cefTRIAXone (ROCEPHIN) 1 g in sodium chloride 0.9 % 100 mL IVPB  Status:  Discontinued       ° 1 g °200 mL/hr over 30 Minutes Intravenous Every 24 hours 01/29/22 0013 02/01/22 1504  ° 01/28/22 1830  cefTRIAXone (ROCEPHIN) 1 g in sodium chloride 0.9 % 100 mL IVPB       ° 1 g °200 mL/hr over 30 Minutes Intravenous  Once 01/28/22 1828 01/28/22 1927  ° °  ° °Culture/Microbiology °   °Component Value Date/Time  ° SDES  02/10/2022 1337  °  PLEURAL °Performed at [facility], [address] [place], [place] [address] °  ° SPECREQUEST  02/10/2022 1337  °  NONE °Performed at [facility], [address] [place], [place] [address] °  ° CULT  02/10/2022 1337  °  MODERATE ENTEROCOCCUS FAECIUM °VANCOMYCIN RESISTANT ENTEROCOCCUS °CRITICAL RESULT CALLED TO, READ BACK BY AND VERIFIED WITH: RN LISA.P AT 1144 ON 02/11/2022 BY T.SAAD. °Sent to [facility] for further susceptibility testing. °Performed at [facility], [address] [place], [place] [address] °  ° REPTSTATUS PENDING 02/10/2022 1337  °Radiology Studies: °DG Chest Port 1 View ° °Result Date: 02/16/2022 °CLINICAL DATA:  Empyema lung EXAM: PORTABLE CHEST - 1 VIEW COMPARISON:  02/14/2022 FINDINGS: Pigtail chest tube stable at the lateral right lung base.  Loculated left inferolateral pleural gas in the empyema cavity as before. Extensive airspace consolidation in the left mid and lower lung stable. Some worsening of diffuse a airspace opacities on the right predominantly perihilar. Stable right IJ port catheter. Heart size and mediastinal contours are within normal limits. Visualized bones unremarkable. Left nephrostomy tube and proximal aspect of ureteral stent noted. Metallic GI stent in the left upper abdomen partially visualized. Cholecystectomy clips. IMPRESSION: 1. Persistent loculated gas in the left empyema cavity, with stable chest drain. 2. worsening of  right perihilar airspace opacities. Electronically Signed   By: D  Hassell M.D.   On: 02/16/2022 08:52   ° ° LOS: 18 days  ° °Jai-Gurmukh , MD °[facility] ° °02/16/2022, 5:55 PM  °  °

## 2022-02-16 NOTE — Progress Notes (Signed)
Occupational Therapy Treatment ?Patient Details ?Name: Kevin Villegas ?MRN: [id number] ?DOB: 1941/11/23 ?Today's Date: 02/16/2022 ? ? ?History of present illness Patient is a 81 year old male who presented on 01/28/22 with 2-3 day history of abdominal pain. patient was found to have SBO. patient underwent diagnostic laparoscopy with lysis of adhesions 2/14. New LLE DVT 2/27. PMH:HTN, stage 4 pancreatic cancer,CKD III, status post colectomy, hydronephrosis status post nephrostomy. ?  ?OT comments ? Patient participated in oral hygiene sitting edge of bed with increased encouragement from hospital staff. Patient declined to participate in standing attempts on this date. Patient was mod A x2 for repositioning in bed with line management. Patient would continue to benefit from skilled OT services at this time while admitted and after d/c to address noted deficits in order to improve overall safety and independence in ADLs.  ?  ? ?Recommendations for follow up therapy are one component of a multi-disciplinary discharge planning process, led by the attending physician.  Recommendations may be updated based on patient status, additional functional criteria and insurance authorization. ?   ?Follow Up Recommendations ? Skilled nursing-short term rehab (<3 hours/day)  ?  ?Assistance Recommended at Discharge Frequent or constant Supervision/Assistance  ?Patient can return home with the following ? A lot of help with bathing/dressing/bathroom;Assistance with cooking/housework;Direct supervision/assist for financial management;Assist for transportation;Direct supervision/assist for medications management;Help with stairs or ramp for entrance;A little help with walking and/or transfers ?  ?Equipment Recommendations ?    ?  ?Recommendations for Other Services   ? ?  ?Precautions / Restrictions Precautions ?Precautions: Fall ?Precaution Comments: abdominal wounds, ostomy, nephrostomy, colostomy, chest tube to L chest wall to wall  suction, LLE DVT on heparin ?Restrictions ?Weight Bearing Restrictions: No  ? ? ?  ? ?Mobility Bed Mobility ?Overal bed mobility: Needs Assistance ?Bed Mobility: Rolling, Supine to Sit, Sit to Supine ?Rolling: Mod assist, +2 for physical assistance ?Sidelying to sit: +2 for safety/equipment, +2 for physical assistance ?Supine to sit: Mod assist, +2 for physical assistance ?Sit to supine: Mod assist, +2 for physical assistance ?  ?General bed mobility comments: mod A+2 for supine<>sit for BLE and trunk management, increased time with cues for log roll to protect abdomen; mod A+2 for rolling, pt limited by pain, able to reach with BUE with cues ?  ? ?Transfers ?  ?  ?  ?  ?  ?  ?  ?  ?  ?  ?  ?  ?Balance Overall balance assessment: Needs assistance ?Sitting-balance support: Feet supported ?Sitting balance-Leahy Scale: Fair ?  ?  ?  ?  ?  ?  ?  ?  ?  ?  ?  ?  ?  ?  ?  ?  ?   ? ?ADL either performed or assessed with clinical judgement  ? ?ADL Overall ADL's : Needs assistance/impaired ?  ?  ?Grooming: Set up;Oral care;Wash/dry face;Sitting ?Grooming Details (indicate cue type and reason): sitting on edge of bed with increased cues to participate in task. ?  ?  ?  ?  ?  ?  ?  ?  ?  ?  ?  ?  ?  ?  ?  ?General ADL Comments: patient was max A for sitting on edge of bed with increased time. patient was educated on importance of participation in therapy. patient verbalized understanding but continued to report increased pain with sitting. nursing in room to assess bottom. patient encouraged to attempt to participate in standing but patient declined requesting  to lie back down. ?  ? ?Extremity/Trunk Assessment   ?  ?  ?  ?  ?  ? ?Vision   ?  ?  ?Perception   ?  ?Praxis   ?  ? ?Cognition Arousal/Alertness: Awake/alert ?Behavior During Therapy: [facility] for tasks assessed/performed ?Overall Cognitive Status: Within Functional Limits for tasks assessed ?  ?  ?  ?  ?  ?  ?  ?  ?  ?  ?  ?  ?  ?  ?  ?  ?General Comments: patient was noted  to have difficulty keeping eyes open but able to open when specifically cued. ?  ?  ?   ?Exercises   ? ?  ?Shoulder Instructions   ? ? ?  ?General Comments    ? ? ?Pertinent Vitals/ Pain       Pain Assessment ?Pain Assessment: Faces ?Faces Pain Scale: Hurts even more ?Pain Location: "the whole rib cage" ?Pain Descriptors / Indicators: Discomfort, Sore ?Pain Intervention(s): Limited activity within patient's tolerance, Monitored during session, Premedicated before session, Repositioned ? ?Home Living   ?  ?  ?  ?  ?  ?  ?  ?  ?  ?  ?  ?  ?  ?  ?  ?  ?  ?  ? ?  ?Prior Functioning/Environment    ?  ?  ?  ?   ? ?Frequency ? Min 2X/week  ? ? ? ? ?  ?Progress Toward Goals ? ?OT Goals(current goals can now be found in the care plan section) ? Progress towards OT goals: Progressing toward goals ? ?   ?Plan Discharge plan remains appropriate   ? ?Co-evaluation ? ? ? PT/OT/SLP Co-Evaluation/Treatment: Yes ?Reason for Co-Treatment: For patient/therapist safety;To address functional/ADL transfers ?PT goals addressed during session: Mobility/safety with mobility ?OT goals addressed during session: ADL's and self-care ?  ? ?  ?AM-PAC OT "6 Clicks" Daily Activity     ?Outcome Measure ? ? Help from another person eating meals?: A Little ?Help from another person taking care of personal grooming?: A Little ?Help from another person toileting, which includes using toliet, bedpan, or urinal?: A Little ?Help from another person bathing (including washing, rinsing, drying)?: A Lot ?Help from another person to put on and taking off regular upper body clothing?: A Little ?Help from another person to put on and taking off regular lower body clothing?: A Lot ?6 Click Score: 16 ? ?  ?End of Session   ? ?OT Visit Diagnosis: Unsteadiness on feet (R26.81);Pain;Muscle weakness (generalized) (M62.81) ?  ?Activity Tolerance Patient tolerated treatment well ?  ?Patient Left in chair;with call bell/phone within reach;with chair alarm set ?  ?Nurse  Communication Mobility status ?  ? ?   ? ?Time: 9924-2683 ?OT Time Calculation (min): 23 min ? ?Charges: OT General Charges ?$OT Visit: 1 Visit ?OT Treatments ?$Self Care/Home Management : 8-22 mins ? ?Takesha Steger OTR/L, MS ?Acute Rehabilitation Department ?Office# [phone] ?Pager# [phone] ? ? ?[place] ?02/16/2022, 1:15 PM ?

## 2022-02-16 NOTE — Progress Notes (Signed)
ANTICOAGULATION CONSULT NOTE   ? ?Pharmacy Consult for IV heparin ?Indication: DVT ? ?Allergies  ?Allergen Reactions  ? Pollen Extract Other (See Comments)  ?  Sinus congestion  ? ? ?Patient Measurements: ?Height: 5\' 11"  (180.3 cm) ?Weight: 67.4 kg (148 lb 9.4 oz) ?IBW/kg (Calculated) : 75.3 ?Heparin Dosing Weight: actual body weight  ? ?Vital Signs: ?Temp: 98.4 ?F (36.9 ?C) (03/01 4496) ?Temp Source: Oral (03/01 7591) ?BP: 135/88 (03/01 6384) ?Pulse Rate: 73 (03/01 0616) ? ?Labs: ?Recent Labs  ?  02/14/22 ?0713 02/14/22 ?0950 02/14/22 ?1801 02/15/22 ?0357 02/15/22 ?1249 02/15/22 ?2205 02/16/22 ?0358  ?HGB 10.0*  --   --  10.3*  --   --  9.3*  ?HCT 32.3*  --   --  33.2*  --   --  29.7*  ?PLT 1,049*  --   --  1,066*  --   --  1,053*  ?APTT  --  30  --   --   --   --   --   ?LABPROT  --  14.9  --   --   --   --   --   ?INR  --  1.2  --   --   --   --   --   ?HEPARINUNFRC  --   --    < > 0.15* 0.13* 0.53 0.46  ?CREATININE 1.15  --   --  1.25*  --   --  1.02  ? < > = values in this interval not displayed.  ? ? ? ?Estimated Creatinine Clearance: 55.1 mL/min (by C-G formula based on SCr of 1.02 mg/dL). ? ? ?Medical History: ?Past Medical History:  ?Diagnosis Date  ? Colostomy present ([place])   ? Large bowel obstruction ([facility])   ? Pancreatic cancer ([place])   ? ? ?Assessment: ?79 y/oM with PMH of metastatic pancreatic cancer and history of SBO presented to [facility] ED for abdominal pain, nausea, vomiting found to have partial SBO s/p laparoscopic lysis of adhesions, small bowel resection, excisional bipsy of mesentery mass on 2/14. Underwent left thoracentesis for pleural effusion 2/23, chest tube placed 2/24, intrapleural lytics given 2/25. Today, left lower extremity venous duplex + for LLE DVT. Pharmacy consulted for IV heparin dosing. Hgb low but stable at 10, Pltc elevated at 1049K. Note patient has been ordered Enoxaparin 40mg  SQ q24h for VTE prophylaxis throughout this admission, but patient refused doses 2/24-2/26 (last dose  administered 2/23 at 1004).  ? ?Today, 02/16/22 ?- Heparin level 0.46 units/mL, now therapeutic x 2   ?- CBC: Hgb decreased to 9.3, Pltc remains elevated at 1053K ?- No bleeding or infusion issues reported per nursing  ? ?Goal of Therapy:  ?Heparin level 0.3-0.7 units/ml ?Monitor platelets by anticoagulation protocol: Yes ?  ?Plan:  ?Continue heparin infusion at 1800 units/hr ?Daily CBC, heparin level ?Monitor closely for s/sx of bleeding ? ? ?Lindell Spar, PharmD, BCPS ?Clinical Pharmacist  ?02/16/2022 7:06 AM ?

## 2022-02-16 NOTE — Progress Notes (Signed)
? ?NAME:  Kevin Villegas, MRN:  [id number], DOB:  1941/02/23, LOS: 72 ?ADMISSION DATE:  01/28/2022, CONSULTATION DATE:  02/11/2022 ?REFERRING MD:  Dr. Antonieta Pert, CHIEF COMPLAINT:  Pleural effusion  ? ?History of Present Illness:  ?Kevin Villegas is a 81 year old unfortunate man with metastatic pancreatic cancer.  He was admitted 2/9 for recurrent small bowel obstruction and on 2/14 underwent lysis of adhesions, small bowel resection and excision biopsy of mesenteric mass by Dr. 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 ?He was  treated for E. coli UTI ?  ?He also has a history of left hydronephrosis status post nephrostomy tube with stent, chronic right renal atrophy and hydroureteronephrosis ?  ?Review of his serial imaging dating back to November of last year shows metastatic lung nodules with left pleural disease and a small to moderate size left effusion. ?He had persistent leukocytosis and underwent left thoracentesis 2/23 , only 50 cc of thick right purulent fluid was removed. ?Pleural fluid results shows 25K cells with 100% neutrophils, culture showing Enterococcus. ? ?Pertinent  Medical History  ?Metastatic pancreatic cancer, large bowel obstruction resulting in colectomy and colostomy ? ?Significant Hospital Events: ?Including procedures, antibiotic start and stop dates in addition to other pertinent events   ?2/10 admitted  ?2/14 Underwent lysis of adhesions and laparoscopic small bowel resection with Dr. [name] ?2/23 underwent left thoracentesis with 39ml fluid removed ?2/24 Pulmonary consulted for management of loculated pleural effusion , pigtail placed ?2/25 lytics #1 ?2/26 left ex-vacuo pneumothorax ? ?Images  ?1.No evidence of postoperative intra-abdominal abscess. Moderate ?right upper quadrant free fluid and small amount of free fluid in ?the pelvis is non organized. ?2. Enteric tube terminates at the gastroesophageal junction. ?3. Left nephrostomy and left  nephroureteral stent in place without ?left hydronephrosis. Chronic right renal atrophy and ?hydroureteronephrosis. ?4. Pulmonary metastatic disease in the lung bases with multiple ?pulmonary nodules. Enlarging left pleural effusion with pleural ?thickening, possibly metastatic. Moderate-sized right pleural ?effusion is new. ?5. Diffuse body wall edema. ?6. Soft tissue nodule in the right anterior abdominal wall is ?unchanged, possibly metastatic. ? ? ?Interim History / Subjective:  ? ?Chest tube drainage to 20 cc last 24 hours ?Afebrile ?On room air ? ?Objective   ?Blood pressure 135/88, pulse 73, temperature 98.4 ?F (36.9 ?C), temperature source Oral, resp. rate 20, height 5\' 11"  (1.803 m), weight 67.4 kg, SpO2 95 %. ?   ?   ? ?Intake/Output Summary (Last 24 hours) at 02/16/2022 1141 ?Last data filed at 02/16/2022 0700 ?Gross per 24 hour  ?Intake 2526.71 ml  ?Output 1640 ml  ?Net 886.71 ml  ? ? ?Filed Weights  ? 02/14/22 0500 02/15/22 0500 02/16/22 0500  ?Weight: 69.8 kg 69.9 kg 67.4 kg  ? ? ?Examination: ?General: chronically ill appearing deconditioned frail elderly male lying in bed in NAD ?HEENT: [place]/AT, MM pink/moist, PERRL,  ?Neuro: Nonfocal, alert oriented x3, lethargic ?CV: S1-S2 regular ?PULM: Decreased breath sounds on left, left pigtail with serous drainage, good pleural variation ?GI: soft, bowel sounds active in all 4 quadrants, non-tender, slightly distended, left nephrostomy tube  ?Extremities: warm/dry, no edema  ?Skin: no rashes or lesions ? ?Chest x-ray 3/1 independently reviewed shows ex-vacuo pneumothorax is persistent, consolidation in the left lung and on the right with some worsening ? ?Resolved Hospital Problem list   ? ? ?Assessment & Plan:  ?Left empyema -with Enterococcus likely seeding previously known loculated left effusion, likely malignant ?-CT ABD pelvis revealed pulmonary metastatic disease in the  lung bases with multiple pulmonary nodules. Enlarging left pleural effusion with pleural  thickening, possibly metastatic and moderate-sized right pleural effusion ?-IR preformed left thoracentesis 2/23 with 50 ml of light red/purulent/thick fluid removed  ?Ex-vacuo pneumothorax status post pigtail likely represents trapped lung  ?Pleural fluid cytology is negative ?P: ? ?Continue Zyvox ?Further lytics not indicated ? we  have reached maximum benefit with chest tube  ?-will continue chest tube to suction  ?-Plan to discontinue chest tube once drainage less than 150 cc , getting close ? ?Acute LLE DVT -on IV heparin ?Okay to mobilize by PT once therapeutic ? ?Palliative conversations ongoing, palliative care, surgery, oncology following ? ?Best Practice (right click and "Reselect all SmartList Selections" daily)  ?Per primary  ? ?Labs   ?CBC: ?Recent Labs  ?Lab 02/12/22 ?1039 02/13/22 ?3491 02/14/22 ?7915 02/15/22 ?0569 02/16/22 ?0358  ?WBC 31.2* 24.3* 18.4* 20.2* 21.0*  ?NEUTROABS  --  20.2* 15.1* 15.3* 15.7*  ?HGB 10.1* 10.4* 10.0* 10.3* 9.3*  ?HCT 33.1* 33.7* 32.3* 33.2* 29.7*  ?MCV 99.1 96.8 97.0 96.0 96.1  ?PLT 866* 1,086* 1,049* 1,066* 1,053*  ? ? ? ?Basic Metabolic Panel: ?Recent Labs  ?Lab 02/10/22 ?0812 02/12/22 ?7948 02/13/22 ?0165 02/14/22 ?5374 02/15/22 ?8270 02/16/22 ?7867  ?NA 143  142 141 142 139 141 140  ?K 4.0  4.0 3.8 4.5 4.0 4.3 4.1  ?CL 112*  112* 114* 109 109 107 107  ?CO2 24  23 23 26 26 28 27   ?GLUCOSE 160*  160* 177* 113* 160* 150* 132*  ?BUN 51*  50* 55* 52* 46* 48* 46*  ?CREATININE 1.23  1.20 1.19 1.24 1.15 1.25* 1.02  ?CALCIUM 8.3*  8.2* 8.2* 8.5* 7.9* 8.0* 7.8*  ?MG 2.3 2.2  --  2.0 2.1  --   ?PHOS 3.6  --   --  3.6 3.5  --   ? ? ?GFR: ?Estimated Creatinine Clearance: 55.1 mL/min (by C-G formula based on SCr of 1.02 mg/dL). ?Recent Labs  ?Lab 02/10/22 ?0812 02/11/22 ?5449 02/12/22 ?2010 02/12/22 ?1039 02/13/22 ?0712 02/14/22 ?1975 02/15/22 ?8832 02/16/22 ?0358  ?PROCALCITON 1.88 1.41 1.37  --   --   --   --   --   ?WBC 38.6* 35.5*  --    < > 24.3* 18.4* 20.2* 21.0*  ? <  > = values in this interval not displayed.  ? ? ? ?Liver Function Tests: ?Recent Labs  ?Lab 02/10/22 ?5498 02/13/22 ?2641 02/14/22 ?5830 02/15/22 ?9407 02/16/22 ?0358  ?AST 102* 83* 70* 60* 45*  ?ALT 91* 65* 60* 53* 40  ?ALKPHOS 204* 224* 210* 228* 191*  ?BILITOT 0.7 0.3 0.3 0.5 0.1*  ?PROT 5.8* 5.7* 5.3* 5.7* 5.5*  ?ALBUMIN 1.6* <1.5* <1.5* 1.6* <1.5*  ? ? ?No results for input(s): LIPASE, AMYLASE in the last 168 hours. ?No results for input(s): AMMONIA in the last 168 hours. ? ?ABG ?   ?Component Value Date/Time  ? TCO2 28 12/10/2021 0036  ? ?  ?Kara Mead MD. [facility]ade Flood. ? Pulmonary & Critical care ?Pager : [phone] ? ?If no response to pager , please call [phone] until 7 pm ?After 7:00 pm call Elink  [phone]  ? ? ?02/16/2022, 11:41 AM ? ? ? ? ? ? ?

## 2022-02-16 NOTE — Progress Notes (Signed)
Patient ID: Kevin Villegas, male   DOB: Mar 23, 1941, 81 y.o.   MRN: [id number] [facility] Surgery Progress Note  15 Days Post-Op  Subjective: CC-  Complaining of pain in his ribs. States that he still has some abdominal pain as well, but no further n/v. Tolerating clear liquids. Ostomy with some output.  Objective: Vital signs in last 24 hours: Temp:  [98.1 F (36.7 C)-98.4 F (36.9 C)] 98.4 F (36.9 C) (03/01 0616) Pulse Rate:  [73-93] 73 (03/01 0616) Resp:  [16-20] 20 (03/01 0616) BP: (135-160)/(88-93) 135/88 (03/01 0616) SpO2:  [89 %-98 %] 95 % (03/01 0616) Weight:  [67.4 kg] 67.4 kg (03/01 0500) Last BM Date : 02/13/22  Intake/Output from previous day: 02/28 0701 - 03/01 0700 In: 2536.7 [P.O.:110; I.V.:2416.7] Out: 1940 [Urine:1700; Stool:20; Chest Tube:220] Intake/Output this shift: No intake/output data recorded.  PE: Gen:  Alert, uncomfortable Abd: distended but overall soft, mostly right sided abdominal tenderness, ostomy viable with some soft brown stool in pouch. Suprapubic port site open and draining pus with less fluctuance and cellulitis today  Lab Results:  Recent Labs    02/15/22 0357 02/16/22 0358  WBC 20.2* 21.0*  HGB 10.3* 9.3*  HCT 33.2* 29.7*  PLT 1,066* 1,053*   BMET Recent Labs    02/15/22 0357 02/16/22 0358  NA 141 140  K 4.3 4.1  CL 107 107  CO2 28 27  GLUCOSE 150* 132*  BUN 48* 46*  CREATININE 1.25* 1.02  CALCIUM 8.0* 7.8*   PT/INR Recent Labs    02/14/22 0950  LABPROT 14.9  INR 1.2   CMP     Component Value Date/Time   NA 140 02/16/2022 0358   NA 139 12/31/2021 1127   K 4.1 02/16/2022 0358   CL 107 02/16/2022 0358   CO2 27 02/16/2022 0358   GLUCOSE 132 (H) 02/16/2022 0358   BUN 46 (H) 02/16/2022 0358   BUN 38 (H) 12/31/2021 1127   CREATININE 1.02 02/16/2022 0358   CREATININE 1.58 (H) 11/18/2021 1149   CALCIUM 7.8 (L) 02/16/2022 0358   PROT 5.5 (L) 02/16/2022 0358   PROT 7.2 12/31/2021 1127   ALBUMIN <1.5 (L)  02/16/2022 0358   ALBUMIN 3.9 12/31/2021 1127   AST 45 (H) 02/16/2022 0358   AST 44 (H) 11/18/2021 1149   ALT 40 02/16/2022 0358   ALT 45 (H) 11/18/2021 1149   ALKPHOS 191 (H) 02/16/2022 0358   BILITOT 0.1 (L) 02/16/2022 0358   BILITOT 0.3 12/31/2021 1127   BILITOT 0.3 11/18/2021 1149   GFRNONAA >60 02/16/2022 0358   GFRNONAA 44 (L) 11/18/2021 1149   GFRAA >60 11/24/2019 1600   Lipase     Component Value Date/Time   LIPASE 42 01/28/2022 1500       Studies/Results: DG Chest Port 1 View  Result Date: 02/16/2022 CLINICAL DATA:  Empyema lung EXAM: PORTABLE CHEST - 1 VIEW COMPARISON:  02/14/2022 FINDINGS: Pigtail chest tube stable at the lateral right lung base. Loculated left inferolateral pleural gas in the empyema cavity as before. Extensive airspace consolidation in the left mid and lower lung stable. Some worsening of diffuse a airspace opacities on the right predominantly perihilar. Stable right IJ port catheter. Heart size and mediastinal contours are within normal limits. Visualized bones unremarkable. Left nephrostomy tube and proximal aspect of ureteral stent noted. Metallic GI stent in the left upper abdomen partially visualized. Cholecystectomy clips. IMPRESSION: 1. Persistent loculated gas in the left empyema cavity, with stable chest drain. 2. worsening of  right perihilar airspace opacities. Electronically Signed   By: Lucrezia Europe M.D.   On: 02/16/2022 08:52   DG CHEST PORT 1 VIEW  Result Date: 02/14/2022 CLINICAL DATA:  Empyema EXAM: PORTABLE CHEST 1 VIEW COMPARISON:  Chest x-ray dated February 13, 2022 FINDINGS: Cardiac and mediastinal contours are unchanged. Bilateral nodular opacities and heterogeneous opacities of left lung, unchanged compared to prior. Small left hydropneumothorax with slightly decreased size of air component. Small right pleural effusion. IMPRESSION: Small left hydropneumothorax with slightly decreased size of air component. Left-sided chest tube in  place. Electronically Signed   By: Yetta Glassman M.D.   On: 02/14/2022 12:10   ECHOCARDIOGRAM COMPLETE BUBBLE STUDY  Result Date: 02/14/2022    ECHOCARDIOGRAM REPORT   Patient Name:   Kevin Villegas Date of Exam: 02/14/2022 Medical Rec #:  [id number]      Height:       71.0 in Accession #:    [id number]     Weight:       153.9 lb Date of Birth:  Aug 19, 1941      BSA:          1.886 m Patient Age:    35 years       BP:           133/95 mmHg Patient Gender: M              HR:           80 bpm. Exam Location:  Inpatient Procedure: 2D Echo and Saline Contrast Bubble Study Indications:    Pleural effusion. Saline contrast study requested.  History:        Patient has no prior history of Echocardiogram examinations.  Sonographer:    Arlyss Gandy Referring Phys: [id number] [place]  1. The aortic valve is calcified. There is severe calcifcation of the aortic valve. There is severe thickening of the aortic valve. Aortic valve regurgitation is not visualized. LVOT VTI signal suboptimal and therefore difficult to calculate AVA and DI.  Suspect severe vs moderate-to-severe aortic stenosis. Mean gradient 20mmHg, Vmax 3.21m/s. AVA by rough estimate 0.7cm2, DI 0.22. Consider TEE vs CTA for further evaluation.  2. Left ventricular ejection fraction, by estimation, is 55 to 60%. The left ventricle has normal function. The left ventricle has no regional wall motion abnormalities. There is moderate hypertrophy of the basal septum. The rest of the LV segments demonstrate mild left ventricular hypertrophy. Left ventricular diastolic parameters are consistent with Grade I diastolic dysfunction (impaired relaxation).  3. Right ventricular systolic function is normal. The right ventricular size is normal. There is normal pulmonary artery systolic pressure.  4. The mitral valve is normal in structure. Trivial mitral valve regurgitation.  5. Aortic dilatation noted. There is moderate dilatation of the ascending aorta,  measuring 45 mm.  6. The inferior vena cava is normal in size with greater than 50% respiratory variability, suggesting right atrial pressure of 3 mmHg. Comparison(s): No prior Echocardiogram. FINDINGS  Left Ventricle: Left ventricular ejection fraction, by estimation, is 55 to 60%. The left ventricle has normal function. The left ventricle has no regional wall motion abnormalities. The left ventricular internal cavity size was normal in size. There is  moderate hypertrophy of the basal septum. The rest of the LV segments demonstrate mild left ventricular hypertrophy. Left ventricular diastolic parameters are consistent with Grade I diastolic dysfunction (impaired relaxation). Right Ventricle: The right ventricular size is normal. No increase in right ventricular wall thickness. Right ventricular systolic function  is normal. There is normal pulmonary artery systolic pressure. The tricuspid regurgitant velocity is 2.81 m/s, and  with an assumed right atrial pressure of 3 mmHg, the estimated right ventricular systolic pressure is 44.0 mmHg. Left Atrium: Left atrial size was normal in size. Right Atrium: Right atrial size was normal in size. Pericardium: There is no evidence of pericardial effusion. Mitral Valve: The mitral valve is normal in structure. Mild mitral annular calcification. Trivial mitral valve regurgitation. Tricuspid Valve: The tricuspid valve is normal in structure. Tricuspid valve regurgitation is trivial. Aortic Valve: The aortic valve is calcified. There is severe calcifcation of the aortic valve. There is severe thickening of the aortic valve. Aortic valve regurgitation is not visualized. Moderate to severe aortic stenosis is present. Aortic valve mean gradient measures 17.8 mmHg. Aortic valve peak gradient measures 32.6 mmHg. Aortic valve area, by VTI measures 0.97 cm. Pulmonic Valve: The pulmonic valve was not well visualized. Pulmonic valve regurgitation is trivial. Aorta: Aortic dilatation  noted. There is moderate dilatation of the ascending aorta, measuring 45 mm. Venous: The inferior vena cava is normal in size with greater than 50% respiratory variability, suggesting right atrial pressure of 3 mmHg. IAS/Shunts: The atrial septum is grossly normal. Agitated saline contrast was given intravenously to evaluate for intracardiac shunting.  LEFT VENTRICLE PLAX 2D LVIDd:         3.40 cm   Diastology LVIDs:         2.40 cm   LV e' medial:    5.11 cm/s LV PW:         1.30 cm   LV E/e' medial:  12.6 LV IVS:        1.50 cm   LV e' lateral:   5.22 cm/s LVOT diam:     2.40 cm   LV E/e' lateral: 12.3 LV SV:         49 LV SV Index:   26 LVOT Area:     4.52 cm  RIGHT VENTRICLE             IVC RV Basal diam:  3.40 cm     IVC diam: 1.20 cm RV Mid diam:    2.90 cm RV S prime:     15.40 cm/s TAPSE (M-mode): 1.9 cm LEFT ATRIUM           Index        RIGHT ATRIUM           Index LA diam:      2.00 cm 1.06 cm/m   RA Area:     10.40 cm LA Vol (A2C): 16.1 ml 8.54 ml/m   RA Volume:   23.70 ml  12.56 ml/m LA Vol (A4C): 24.0 ml 12.72 ml/m  AORTIC VALVE AV Area (Vmax):    0.93 cm AV Area (Vmean):   0.98 cm AV Area (VTI):     0.97 cm AV Vmax:           285.50 cm/s AV Vmean:          187.250 cm/s AV VTI:            0.507 m AV Peak Grad:      32.6 mmHg AV Mean Grad:      17.8 mmHg LVOT Vmax:         58.80 cm/s LVOT Vmean:        40.400 cm/s LVOT VTI:          0.109 m LVOT/AV VTI ratio: 0.21  AORTA  Ao Root diam: 3.50 cm Ao Asc diam:  4.50 cm MITRAL VALVE               TRICUSPID VALVE MV Area (PHT): 2.36 cm    TR Peak grad:   31.6 mmHg MV Decel Time: 322 msec    TR Vmax:        281.00 cm/s MV E velocity: 64.30 cm/s MV A velocity: 80.80 cm/s  SHUNTS MV E/A ratio:  0.80        Systemic VTI:  0.11 m                            Systemic Diam: 2.40 cm Gwyndolyn Kaufman MD Electronically signed by Gwyndolyn Kaufman MD Signature Date/Time: 02/14/2022/5:22:27 PM    Final     Anti-infectives: Anti-infectives (From admission,  onward)    Start     Dose/Rate Route Frequency Ordered Stop   02/14/22 1015  linezolid (ZYVOX) tablet 600 mg        600 mg Oral Every 12 hours 02/14/22 0929     02/11/22 1330  linezolid (ZYVOX) IVPB 600 mg  Status:  Discontinued        600 mg 300 mL/hr over 60 Minutes Intravenous Every 12 hours 02/11/22 1234 02/14/22 0929   02/04/22 1545  piperacillin-tazobactam (ZOSYN) IVPB 3.375 g        3.375 g 12.5 mL/hr over 240 Minutes Intravenous Every 8 hours 02/04/22 1453 02/09/22 1359   02/01/22 2200  ceFAZolin (ANCEF) IVPB 1 g/50 mL premix  Status:  Discontinued        1 g 100 mL/hr over 30 Minutes Intravenous Every 8 hours 02/01/22 1504 02/04/22 1453   02/01/22 0930  cefoTEtan (CEFOTAN) 2 g in sodium chloride 0.9 % 100 mL IVPB        2 g 200 mL/hr over 30 Minutes Intravenous On call to O.R. 01/31/22 1908 02/01/22 1533   01/30/22 1000  cefTRIAXone (ROCEPHIN) 1 g in sodium chloride 0.9 % 100 mL IVPB  Status:  Discontinued        1 g 200 mL/hr over 30 Minutes Intravenous Every 24 hours 01/29/22 0013 02/01/22 1504   01/28/22 1830  cefTRIAXone (ROCEPHIN) 1 g in sodium chloride 0.9 % 100 mL IVPB        1 g 200 mL/hr over 30 Minutes Intravenous  Once 01/28/22 1828 01/28/22 1927        Assessment/Plan Stage IV pancreatic cancer with psbo  - POD#15 s/p Lap LOA, Small bowel resection, excisional biopsy of mesentery mass by Dr. [name] 2/14 - operative findings of oligometastatic disease acting as a transition point in LLQ  - pathology with adenocarcinoma with mucinous features of left colon mesentery and jejunum - CT scan 2/25 without intra abdominal abscess, small abdominal wall abscess; ileus vs partial obstruction. Decrease in left pleural effusion after chest tube placement - WBC is stable elevated at 21, multiple sources. ID consulted and oncology following. No indication for any further abdominal surgery - Lower midline wound infection improving, continue packing suprapubic port site -  Tolerating clears and having some bowel function. Advance to full liquids and continue TPN - Unfortunately the patient is not progressing well. He has had multiple complications and does not look well. He has metastatic cancer. No option for further surgery at this point. Palliative team is following, appreciate their assistance.    FEN: TPN, FLD ID: cefotetan periop, ancef (UTI), zosyn 2/17>2/21. Linezolid  2/24>> VTE: heparin gtt   Left leg DVT on heparin Left empyema s/p chest tube E coli UTI - completed abx CKD 3A HTN Code status DNR - palliative following Severe protein calorie malnutrition   LOS: 18 days    Wellington Hampshire, PA-C [facility] 02/16/2022, 10:00 AM Please see Amion for pager number during day hours 7:00am-4:30pm

## 2022-02-16 NOTE — Progress Notes (Incomplete)
NAME:  Kevin Villegas, MRN:  [id number], DOB:  09/20/1941, LOS: 70 ADMISSION DATE:  01/28/2022, CONSULTATION DATE:  02/11/2022 REFERRING MD:  Dr. Antonieta Pert, CHIEF COMPLAINT:  Pleural effusion   History of Present Illness:  Kevin Villegas is a 81 year old unfortunate man with metastatic pancreatic cancer.  He was admitted 2/9 for recurrent small bowel obstruction and on 2/14 underwent lysis of adhesions, small bowel resection and excision biopsy of mesenteric mass by Dr. 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 He was  treated for E. coli UTI   He also has a history of left hydronephrosis status post nephrostomy tube with stent, chronic right renal atrophy and hydroureteronephrosis   Review of his serial imaging dating back to November of last year shows metastatic lung nodules with left pleural disease and a small to moderate size left effusion. He had persistent leukocytosis and underwent left thoracentesis 2/23 , only 50 cc of thick right purulent fluid was removed. Pleural fluid results shows 25K cells with 100% neutrophils, culture showing Enterococcus.  Pertinent  Medical History  Metastatic pancreatic cancer, large bowel obstruction resulting in colectomy and colostomy  Significant Hospital Events: Including procedures, antibiotic start and stop dates in addition to other pertinent events   2/10 admitted  2/14 Underwent lysis of adhesions and laparoscopic small bowel resection with Dr. [name] 2/23 underwent left thoracentesis with 54ml fluid removed 2/24 Pulmonary consulted for management of loculated pleural effusion , pigtail placed 2/25 lytics #1 2/26 left ex-vacuo pneumothorax   Interim History / Subjective:  Working with PT.  Fatigued but otherwise comfortable.  Vitals stable. Chest tube with 220 output overnight. CXR about the same.  Objective   Blood pressure 135/88, pulse 73, temperature 98.4 F (36.9 C), temperature source Oral, resp.  rate 20, height 5\' 11"  (1.803 m), weight 67.4 kg, SpO2 95 %.        Intake/Output Summary (Last 24 hours) at 02/16/2022 0937 Last data filed at 02/16/2022 0700 Gross per 24 hour  Intake 2536.71 ml  Output 1640 ml  Net 896.71 ml    Filed Weights   02/14/22 0500 02/15/22 0500 02/16/22 0500  Weight: 69.8 kg 69.9 kg 67.4 kg    Examination: General: Adult male, frail, sitting up in bed working with PT, in NAD. Neuro: A&O x 3, no deficits but globally weak. HEENT: [place]/AT. Sclerae anicteric. EOMI. Cardiovascular: RRR, no M/R/G.  Lungs: Respirations even and unlabored.  Diminished on left.  Chest tube in place, no leak. Abdomen: BS x 4, soft, NT/ND.  Musculoskeletal: No gross deformities, no edema.  Skin: Intact, warm, no rashes.  Assessment & Plan:  Left empyema - with Enterococcus likely seeding previously known loculated left effusion, likely malignant -CT ABD pelvis revealed pulmonary metastatic disease in the lung bases with multiple pulmonary nodules. Enlarging left pleural effusion with pleural thickening, possibly metastatic and moderate-sized right pleural effusion -IR preformed left thoracentesis 2/23 with 50 ml of light red/purulent/thick fluid removed  Ex-vacuo pneumothorax status post pigtail likely represents trapped lung  Pleural fluid cytology is negative P: Continue Zyvox Do not think more lytics will help here , we may have reached maximum benefit with chest tube  Continue chest tube to suction for now, consider d/c 3/2 or 3/3 depending on output (ideally wait until decreased to less than 150cc)   Acute LLE DVT -on IV heparin P: - Okay to mobilize by PT once therapeutic   Rest per primary team. Palliative conversations in progress, reviewed notes  from palliative care, surgery, oncology   Montey Hora, PA - C [facility] For pager details, please see AMION or use Epic chat  After 1900, please call [facility] for cross coverage  needs 02/16/2022, 9:42 AM

## 2022-02-17 DIAGNOSIS — Z7189 Other specified counseling: Secondary | ICD-10-CM | POA: Diagnosis not present

## 2022-02-17 DIAGNOSIS — J869 Pyothorax without fistula: Secondary | ICD-10-CM | POA: Diagnosis not present

## 2022-02-17 DIAGNOSIS — K56600 Partial intestinal obstruction, unspecified as to cause: Secondary | ICD-10-CM | POA: Diagnosis not present

## 2022-02-17 LAB — GLUCOSE, CAPILLARY
Glucose-Capillary: 102 mg/dL — ABNORMAL HIGH (ref 70–99)
Glucose-Capillary: 121 mg/dL — ABNORMAL HIGH (ref 70–99)
Glucose-Capillary: 130 mg/dL — ABNORMAL HIGH (ref 70–99)
Glucose-Capillary: 134 mg/dL — ABNORMAL HIGH (ref 70–99)
Glucose-Capillary: 97 mg/dL (ref 70–99)

## 2022-02-17 LAB — CBC WITH DIFFERENTIAL/PLATELET
Abs Immature Granulocytes: 0.74 10*3/uL — ABNORMAL HIGH (ref 0.00–0.07)
Basophils Absolute: 0.2 10*3/uL — ABNORMAL HIGH (ref 0.0–0.1)
Basophils Relative: 1 %
Eosinophils Absolute: 0.6 10*3/uL — ABNORMAL HIGH (ref 0.0–0.5)
Eosinophils Relative: 2 %
HCT: 31.3 % — ABNORMAL LOW (ref 39.0–52.0)
Hemoglobin: 9.5 g/dL — ABNORMAL LOW (ref 13.0–17.0)
Immature Granulocytes: 3 %
Lymphocytes Relative: 10 %
Lymphs Abs: 2.3 10*3/uL (ref 0.7–4.0)
MCH: 29.9 pg (ref 26.0–34.0)
MCHC: 30.4 g/dL (ref 30.0–36.0)
MCV: 98.4 fL (ref 80.0–100.0)
Monocytes Absolute: 1.9 10*3/uL — ABNORMAL HIGH (ref 0.1–1.0)
Monocytes Relative: 8 %
Neutro Abs: 17.6 10*3/uL — ABNORMAL HIGH (ref 1.7–7.7)
Neutrophils Relative %: 76 %
Platelets: 1020 10*3/uL (ref 150–400)
RBC: 3.18 MIL/uL — ABNORMAL LOW (ref 4.22–5.81)
RDW: 17.4 % — ABNORMAL HIGH (ref 11.5–15.5)
WBC: 23.4 10*3/uL — ABNORMAL HIGH (ref 4.0–10.5)
nRBC: 0.1 % (ref 0.0–0.2)

## 2022-02-17 LAB — COMPREHENSIVE METABOLIC PANEL
ALT: 32 U/L (ref 0–44)
AST: 35 U/L (ref 15–41)
Albumin: 1.5 g/dL — ABNORMAL LOW (ref 3.5–5.0)
Alkaline Phosphatase: 177 U/L — ABNORMAL HIGH (ref 38–126)
Anion gap: 6 (ref 5–15)
BUN: 46 mg/dL — ABNORMAL HIGH (ref 8–23)
CO2: 28 mmol/L (ref 22–32)
Calcium: 7.8 mg/dL — ABNORMAL LOW (ref 8.9–10.3)
Chloride: 103 mmol/L (ref 98–111)
Creatinine, Ser: 1.26 mg/dL — ABNORMAL HIGH (ref 0.61–1.24)
GFR, Estimated: 58 mL/min — ABNORMAL LOW (ref >60)
Glucose, Bld: 119 mg/dL — ABNORMAL HIGH (ref 70–99)
Potassium: 4 mmol/L (ref 3.5–5.1)
Sodium: 137 mmol/L (ref 135–145)
Total Bilirubin: 0.3 mg/dL (ref 0.3–1.2)
Total Protein: 5.6 g/dL — ABNORMAL LOW (ref 6.5–8.1)

## 2022-02-17 LAB — HEPARIN LEVEL (UNFRACTIONATED): Heparin Unfractionated: 0.44 IU/mL (ref 0.30–0.70)

## 2022-02-17 LAB — PHOSPHORUS: Phosphorus: 3.6 mg/dL (ref 2.5–4.6)

## 2022-02-17 LAB — MAGNESIUM: Magnesium: 2.1 mg/dL (ref 1.7–2.4)

## 2022-02-17 MED ORDER — DOCUSATE SODIUM 100 MG PO CAPS
100.0000 mg | ORAL_CAPSULE | Freq: Two times a day (BID) | ORAL | Status: DC
  Administered 2022-02-17 – 2022-02-18 (×3): 100 mg via ORAL
  Filled 2022-02-17 (×3): qty 1

## 2022-02-17 MED ORDER — ENOXAPARIN SODIUM 120 MG/0.8ML IJ SOSY
1.5000 mg/kg | PREFILLED_SYRINGE | INTRAMUSCULAR | Status: DC
  Administered 2022-02-17 – 2022-02-19 (×3): 105 mg via SUBCUTANEOUS
  Filled 2022-02-17 (×3): qty 0.7

## 2022-02-17 MED ORDER — TRAVASOL 10 % IV SOLN
INTRAVENOUS | Status: AC
  Filled 2022-02-17: qty 1122

## 2022-02-17 MED ORDER — SODIUM CHLORIDE 0.9 % IV SOLN
510.0000 mg | Freq: Once | INTRAVENOUS | Status: AC
  Administered 2022-02-17: 510 mg via INTRAVENOUS
  Filled 2022-02-17: qty 17

## 2022-02-17 NOTE — Progress Notes (Signed)
PROGRESS NOTE Kevin Villegas  FUX:[id number] DOB: 1941-03-02 DOA: 01/28/2022 PCP: Dettinger, [name], MD   Brief Narrative/Hospital Course: 81 year old male with stage IV pancreatic cancer status post colectomy, hydronephrosis status post left nephrostomy, hypertension, SBO, CKD stage III who presented with abdominal pain, found to have small bowel obstruction status post diagnostic laparoscopy with lysis of adhesion on 2/40 measuring NG suction n.p.o. while waiting for return of bowel function.    complicated by coffee-ground colored secretion from NG tube, leukocytosis. -Repeat CT scan 2/20 no bowel obstruction, no abscess moderate amount of free fluid in the right upper quadrant, body wall edema noted to the right anterior wall. With persistent leukocytosis but afebrile, being managed with TPN, IV antibiotics-.   Patient completed IV Zosyn 2/17-2/22.   Diet was slowly advanced NG tube removed at this time patient is tolerating diet having output in colostomy bag, having output in nephrostomy tube.    CT scan 2/21 no intra-abdominal abscess or obstruction but shows enlarging left pleural effusion.   ID consulted, underwent left pleural diagnostic tap with 50 mL appears exudative fluid but Gram stain negative, total nucleated cells 25,000, neutrophils100  2/24:placed on Zyvox and underwent small bore chest tube placement by PCCM, lytics done 2/25. 2/26-chest tube placed to suction. 2/27 lower leg edema noted more on left-left duplex ordered came back positive for DVT and placed on heparin drip Ongoing GOC discussion by palliative care-continue with current DNR and full scope of treatment.    Subjective: Awake coherent seems to be understanding of the gravity of the situation Sat oob some today Refusing NG tube feeds--wife and I spoke later and they are discussing this He doesn't like the Tube  Assessment and Plan:  * Partial small bowel obstruction ([place])- (present on admission) Stage  IV pancreatic cancer with partial SBO s/p  Surgery CT 2/25 with small abscess in the abdominal wall Persistent abdominal pain distention nausea vomiting: S/p LOA, SBR,biopsy 2/14- positive for ACC with mucinous features of left colon mesentery and jejunum. Off NG tube 2/21, S/P Zosyn  2/17>>2/22. CT abdomen 2/25-small abscess in the abdominal wall-on wound care Colostomy having output  Has persistent abdominal pain distention and nausea vomiting-downgrade to clear liquid diet from soft 2/27, on TPN. Surgery following closely-he is sick but stable currently ?!?!?!? NG tube feeds--with a goal to what end?  Empyema of left pleural space (HCC) S/P thick purulent drainage- 50 ml by IR 2/23- grew vanc resistant Enterococcus, likely seeding previously known loculated left effusion S/p small bore chest tube placement 2/24 Dr Elsworth Soho + lytics 2/25-Not a candidate for surgery. on Zyvox 2/24 change to PO 2/27> plan to switch to Doxy after 2 weeks-needs at least 4 weeks of antibiotics --TEE scheduled 02/21/22  Fluid cytology is negative.  Chest tube removed 3/2 Rpt CXR in am  Acute metabolic encephalopathy intermittent confusion, multifactorial with deconditioning multiple complex comorbidities.   Communicative.    Thrombocytosis likely reactive.  See above  Leg DVT (deep venous thromboembolism), acute, left (HCC) Duplex of the left leg + for acute DVT in left mid and distal femoral vein, posterior tibial vein and peroneal vein.  This in the setting of his metastatic cancer, postop status immobility despite being on Lovenox prophylaxis. Started heparin 2/27 Eventual life-long lovenox.  E-coli UTI Completed antibiotics   Stage 3a chronic kidney disease (CKD) ([place])- (present on admission) AKI with creatinine at 1.59 improved to 1.1-1.2 with ivf, tpn.  Trend  Colostomy in place [facility]) See #1  Essential  hypertension- (present on admission) Controlled, not on meds.  Hydronephrosis of left kidney-  (present on admission) Due to tumor compression,  s/p left nephrostomy last changed on 1/30, renal ultrasound on 2/17  -chronic right hydronephrosis with atrophic right kidney, urology advised outpatient follow-up.  Nephrostomy draining well  Protein-calorie malnutrition, severe- (present on admission) Augment nutritional status as tolerated.  Dietitian on board.  On Soft diet/TPN and wean TPN as tolerated Nutrition Problem: Severe Malnutrition Etiology: chronic illness, cancer and cancer related treatments Signs/Symptoms: severe fat depletion, severe muscle depletion, percent weight loss Interventions: TPN  Leukocytosis Thrombocytosis: Peaked to 38k-trended down to 18 K but now uptrending, afebrile.  Also has thrombocytosis which is most likely reactive.  On Zyvox.  Leukocytosis is  multifactorial due to patient's empyema and abdominal wall abscess.  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  Dr. Marin Olp following less likely will be candidate for systemic chemotherapy-oncology to discuss goals of care/prognosis with family I spoke with Dr. Marin Olp this morning  Sacral decub stage 3 Turn q 2   DVT prophylaxis: SCDs Start: 01/29/22 0013. On heparin gtt since 2/27 Code Status:   Code Status: DNR Family Communication:plan of care discussed with patient/wife was updated previously.Palliative care met with  FAMILY 2/27.discussed with Dr. Marin Olp today.  Goals of care, counseling/discussion Patient with metastatic pancreatic cancer now with complicated course and hospitalization appears ill deconditioned with failure to thrive.Remains DNR.  family hoping to hear overall prognosis goals of care plan from oncology.   Disposition:Currently not medically stable for discharge. Status OB:[id number] Remains Inpatient appropriate because: ongoing management of SBO/empyema   Objective: Vitals last 24  hrs: Vitals:   02/16/22 2100 02/17/22 0415 02/17/22 0500 02/17/22 1434  BP: 133/87 130/72  102/71  Pulse: 82 73  79  Resp: _0 Temp: 97.7 F (36.5 C) 98.4 F (36.9 C)  98.3 F (36.8 C)  TempSrc: Oral Axillary  Oral  SpO2: 96% 91%  91%  Weight:   70.6 kg   Height:       Weight change: 3.2 kg  Physical Examination:  Awake coherent no distress S1 s 2no m/r/g Cta b no added sound Abd soft nt nd  Nephrotomy in place Wound on bottom as below    Medications reviewed:  Scheduled Meds:  Chlorhexidine Gluconate Cloth  6 each Topical Daily   docusate sodium  100 mg Oral BID   dronabinol  5 mg Oral BID AC   enoxaparin (LOVENOX) injection  1.5 mg/kg Subcutaneous Q24H   feeding supplement  1 Container Oral TID BM   feeding supplement  237 mL Oral BID BM   insulin aspart  0-15 Units Subcutaneous Q6H   lidocaine  1 patch Transdermal Q24H   lip balm  1 application Topical BID   methocarbamol  500 mg Oral QID   OLANZapine  2.5 mg Oral QHS   sodium chloride flush  10 mL Other Q8H   sodium chloride flush  3 mL Intravenous Q12H   Continuous Infusions:  sodium chloride Stopped (01/28/22 2301)   linezolid (ZYVOX) IV 600 mg (02/17/22 0818)   TPN ADULT (ION) 85 mL/hr at 02/17/22 1724    Pressure Injury 02/13/22 Coccyx Posterior Stage 3 -  Full thickness tissue loss. Subcutaneous fat may be visible but bone, tendon or muscle are NOT exposed. pink red shallow open area with yellow center (Active)  02/13/22 (documented for [name] RN) [phone]  Location: Coccyx  Location Orientation: Posterior  Staging: Stage 3 -  Full thickness tissue loss. Subcutaneous fat may be visible but bone, tendon or muscle are NOT exposed.  Wound Description (Comments): pink red shallow open area with yellow center  Present on Admission: Yes (per [name] RN present on assessment)   Diet Order             DIET SOFT Room service appropriate? Yes; Fluid consistency: Thin  Diet effective now                     Nutrition Problem: Severe Malnutrition Etiology: chronic illness, cancer and cancer related treatments Signs/Symptoms: severe fat depletion, severe muscle depletion, percent weight loss Interventions: Boost Breeze, Ensure Enlive (each supplement provides 350kcal and 20 grams of protein), TPN   Intake/Output Summary (Last 24 hours) at 02/17/2022 1909 Last data filed at 02/17/2022 1814 Gross per 24 hour  Intake 3195.39 ml  Output 1865 ml  Net 1330.39 ml    Net IO Since Admission: 9,872.99 mL [02/17/22 1909]  Wt Readings from Last 3 Encounters:  02/17/22 70.6 kg  01/07/22 63.5 kg  12/31/21 63.5 kg      Data Reviewed: I have personally reviewed following labs and imaging studies CBC: Recent Labs  Lab 02/13/22 0623 02/14/22 0713 02/15/22 0357 02/16/22 0358 02/17/22 0429  WBC 24.3* 18.4* 20.2* 21.0* 23.4*  NEUTROABS 20.2* 15.1* 15.3* 15.7* 17.6*  HGB 10.4* 10.0* 10.3* 9.3* 9.5*  HCT 33.7* 32.3* 33.2* 29.7* 31.3*  MCV 96.8 97.0 96.0 96.1 98.4  PLT 1,086* 1,049* 1,066* 1,053* 1,020*    Basic Metabolic Panel: Recent Labs  Lab 02/12/22 0551 02/13/22 0623 02/14/22 0713 02/15/22 0357 02/16/22 0358 02/17/22 0429  NA 141 142 139 141 140 137  K 3.8 4.5 4.0 4.3 4.1 4.0  CL 114* 109 109 107 107 103  CO2 _0 GLUCOSE 177* 113* 160* 150* 132* 119*  BUN 55* 52* 46* 48* 46* 46*  CREATININE 1.19 1.24 1.15 1.25* 1.02 1.26*  CALCIUM 8.2* 8.5* 7.9* 8.0* 7.8* 7.8*  MG 2.2  --  2.0 2.1  --  2.1  PHOS  --   --  3.6 3.5  --  3.6    GFR: Estimated Creatinine Clearance: 46.7 mL/min (A) (by C-G formula based on SCr of 1.26 mg/dL (H)). Liver Function Tests: Recent Labs  Lab 02/13/22 0623 02/14/22 0713 02/15/22 0357 02/16/22 0358 02/17/22 0429  AST 83* 70* 60* 45* 35  ALT 65* 60* 53* 40 32  ALKPHOS 224* 210* 228* 191* 177*  BILITOT 0.3 0.3 0.5 0.1* 0.3  PROT 5.7* 5.3* 5.7* 5.5* 5.6*  ALBUMIN <1.5* <1.5* 1.6* <1.5* 1.5*    No results for  input(s): LIPASE, AMYLASE in the last 168 hours. No results for input(s): AMMONIA in the last 168 hours. Coagulation Profile: Recent Labs  Lab 02/14/22 0950  INR 1.2    Cardiac Enzymes: No results for input(s): CKTOTAL, CKMB, CKMBINDEX, TROPONINI in the last 168 hours. BNP (last 3 results) No results for input(s): PROBNP in the last 8760 hours. HbA1C: No results for input(s): HGBA1C in the last 72 hours. CBG: Recent Labs  Lab 02/16/22 1706 02/17/22 0033 02/17/22 0410 02/17/22 1201 02/17/22 1652  GLUCAP 118* 130* 102* 121* 97    Lipid Profile: No results for input(s): CHOL, HDL, LDLCALC, TRIG, CHOLHDL, LDLDIRECT in the last 72 hours.   Thyroid Function Tests: No results for input(s): TSH, T4TOTAL, FREET4, T3FREE, THYROIDAB  in the last 72 hours. Anemia Panel: Recent Labs    02/15/22 1358  TIBC 186*  IRON 32*    Sepsis Labs: Recent Labs  Lab 02/11/22 0607 02/12/22 0551  PROCALCITON 1.41 1.37      Recent Results (from the past 240 hour(s))  Body fluid culture w Gram Stain     Status: None (Preliminary result)   Collection Time: 02/10/22  1:37 PM   Specimen: PATH Cytology Pleural fluid  Result Value Ref Range Status   Specimen Description   Final    PLEURAL Performed at [facility] [address], [place], [place] [address]    Special Requests   Final    NONE Performed at [facility], [place] [address], [place], [place] [address]    Gram Stain FEW WBC SEEN NO ORGANISMS SEEN   Final   Culture   Final    MODERATE ENTEROCOCCUS FAECIUM VANCOMYCIN RESISTANT ENTEROCOCCUS CRITICAL RESULT CALLED TO, READ BACK BY AND VERIFIED WITH: RN LISA.P AT 1144 ON 02/11/2022 BY T.SAAD. Sent to [place] for further susceptibility testing. Performed at [facility], [place] [address], [place], [place] [address]    Report Status PENDING  Incomplete   Organism ID, Bacteria ENTEROCOCCUS FAECIUM  Final      Susceptibility    Enterococcus faecium - MIC*    AMPICILLIN >=32 RESISTANT Resistant     VANCOMYCIN >=32 RESISTANT Resistant     GENTAMICIN SYNERGY SENSITIVE Sensitive     LINEZOLID 2 SENSITIVE Sensitive     * MODERATE ENTEROCOCCUS FAECIUM     Antimicrobials: Anti-infectives (From admission, onward)    Start     Dose/Rate Route Frequency Ordered Stop   02/16/22 1200  linezolid (ZYVOX) IVPB 600 mg        600 mg 300 mL/hr over 60 Minutes Intravenous Every 12 hours 02/16/22 1115     02/14/22 1015  linezolid (ZYVOX) tablet 600 mg  Status:  Discontinued        600 mg Oral Every 12 hours 02/14/22 0929 02/16/22 1115   02/11/22 1330  linezolid (ZYVOX) IVPB 600 mg  Status:  Discontinued        600 mg 300 mL/hr over 60 Minutes Intravenous Every 12 hours 02/11/22 1234 02/14/22 0929   02/04/22 1545  piperacillin-tazobactam (ZOSYN) IVPB 3.375 g        3.375 g 12.5 mL/hr over 240 Minutes Intravenous Every 8 hours 02/04/22 1453 02/09/22 1359   02/01/22 2200  ceFAZolin (ANCEF) IVPB 1 g/50 mL premix  Status:  Discontinued        1 g 100 mL/hr over 30 Minutes Intravenous Every 8 hours 02/01/22 1504 02/04/22 1453   02/01/22 0930  cefoTEtan (CEFOTAN) 2 g in sodium chloride 0.9 % 100 mL IVPB        2 g 200 mL/hr over 30 Minutes Intravenous On call to O.R. 01/31/22 1908 02/01/22 1533   01/30/22 1000  cefTRIAXone (ROCEPHIN) 1 g in sodium chloride 0.9 % 100 mL IVPB  Status:  Discontinued        1 g 200 mL/hr over 30 Minutes Intravenous Every 24 hours 01/29/22 0013 02/01/22 1504   01/28/22 1830  cefTRIAXone (ROCEPHIN) 1 g in sodium chloride 0.9 % 100 mL IVPB        1 g 200 mL/hr over 30 Minutes Intravenous  Once 01/28/22 1828 01/28/22 1927      Culture/Microbiology    Component Value Date/Time   SDES  02/10/2022 1337  PLEURAL Performed at [facility], [place] [address] [place], [place] [address]    SPECREQUEST  02/10/2022 1337    NONE Performed at [facility], [place]  [address], [place], [place] [address]    CULT  02/10/2022 1337    MODERATE ENTEROCOCCUS FAECIUM VANCOMYCIN RESISTANT ENTEROCOCCUS CRITICAL RESULT CALLED TO, READ BACK BY AND VERIFIED WITH: RN LISA.P AT 1144 ON 02/11/2022 BY T.SAAD. Sent to [place] for further susceptibility testing. Performed at [facility], [place] [address], [place], [place] [address]    REPTSTATUS PENDING 02/10/2022 1337  Radiology Studies: [facility] Chest Port 1 View  Result Date: 02/16/2022 CLINICAL DATA:  Empyema lung EXAM: PORTABLE CHEST - 1 VIEW COMPARISON:  02/14/2022 FINDINGS: Pigtail chest tube stable at the lateral right lung base. Loculated left inferolateral pleural gas in the empyema cavity as before. Extensive airspace consolidation in the left mid and lower lung stable. Some worsening of diffuse a airspace opacities on the right predominantly perihilar. Stable right IJ port catheter. Heart size and mediastinal contours are within normal limits. Visualized bones unremarkable. Left nephrostomy tube and proximal aspect of ureteral stent noted. Metallic GI stent in the left upper abdomen partially visualized. Cholecystectomy clips. IMPRESSION: 1. Persistent loculated gas in the left empyema cavity, with stable chest drain. 2. worsening of  right perihilar airspace opacities. Electronically Signed   By: Lucrezia Europe M.D.   On: 02/16/2022 08:52     LOS: 19 days   Nita Sells, MD [facility]  02/17/2022, 7:09 PM

## 2022-02-17 NOTE — Progress Notes (Signed)
Nutrition Follow-up ? ?DOCUMENTATION CODES:  ? ?Severe malnutrition in context of chronic illness, Underweight ? ?INTERVENTION:  ?- continue Boost Breeze TID, Ensure Plus High Protein BID, and TPN per Pharmacist. ?- will follow-up 3/3 for Calorie Count day #1. ? ? ?NUTRITION DIAGNOSIS:  ? ?Severe Malnutrition related to chronic illness, cancer and cancer related treatments as evidenced by severe fat depletion, severe muscle depletion, percent weight loss. -ongoing ? ?GOAL:  ? ?Patient will meet greater than or equal to 90% of their needs -met with TPN and minimal PO intake.  ? ?MONITOR:  ? ?PO intake, Supplement acceptance, Labs, Weight trends, Skin, Other (Comment) (TPN regimen) ? ?REASON FOR ASSESSMENT:  ? ?Consult ?Enteral/tube feeding initiation and management, Calorie Count ? ?ASSESSMENT:  ? ?81 y.o. male with medical history of stage 4 pancreatic cancer, hydronephrosis s/p nephrostomy, HTN, SBO s/p colectomy, and stage 3 CKD. He presented to the ED due to ongoing abdominal pain x2-3 days with pain being 8/10. He also reported N/V. He was having some ostomy output. He was found to have recurrent SBO and General Surgery was consulted. ? ?Discussed with RN, MD, and Pharmacist.  ? ?Patient was on CLD when seen by this RD on 2/27 and was advanced to FLD on 3/1 AM and to Soft today at 1105. He ate 0% of dinner last night and no intakes from today documented. ? ?He is laying in bed with no visitors present. Patient tired sounding and appearing. He shares that Oncologist talked with him about NGT placement. Patient reports that he is not keen on this idea. Encouraged patient to think about it and to talk with his family about how he would like to proceed and what is important to him. Reminded him that staff is here to support his goals.  ? ?He is receiving custom TPN at goal rate of 85 ml/hr. This regimen is providing 2061 kcal and 112 grams protein.  ? ?Weight has been fluctuating but fairly stable over the past  week. Mild pitting edema to LLE documented in the edema section of flow sheet.  ? ?Palliative Care MD talked with patient late this AM. ? ?Surgery team following and note from this AM states that patient is not a candidate for further surgery at this time.  ? ?  ?Labs reviewed; CBGs: 130, 102, 121 mg/dl, BUN: 46 mg/dl, creatinine: 1.26 mg/dl, Ca: 7.8 mg/dl, Alk Phos elevated, GFR: 58 ml/min.  ?Medications reviewed; 100 mg colace BID, scale novolog. ? ? ? ?Diet Order:   ?Diet Order   ? ?       ?  DIET SOFT Room service appropriate? Yes; Fluid consistency: Thin  Diet effective now       ?  ? ?  ?  ? ?  ? ? ?EDUCATION NEEDS:  ? ?Education needs have been addressed ? ?Skin:  Skin Assessment: Skin Integrity Issues: ?Skin Integrity Issues:: Stage III, Incisions ?Stage III: sacrum ?Incisions: abdomen (2/14) ? ?Last BM:  3/1 (type 6 via colostomy) ? ?Height:  ? ?Ht Readings from Last 1 Encounters:  ?01/28/22 5' 11"  (1.803 m)  ? ? ?Weight:  ? ?Wt Readings from Last 1 Encounters:  ?02/17/22 70.6 kg  ? ? ? ?BMI:  Body mass index is 21.71 kg/m?. ? ?Estimated Nutritional Needs:  ?Kcal:  2050-2250 kcal ?Protein:  105-125 grams ?Fluid:  >/= 2.2 L/day ? ? ? ? ?Jarome Matin, MS, RD, LDN ?Inpatient Clinical Dietitian ?RD pager # available in [place]  ?After hours/weekend pager # available  in [place] ? ?

## 2022-02-17 NOTE — H&P (View-Only) (Signed)
°PROGRESS NOTE °Kevin Villegas  MRN:[id number] DOB: 06/26/1941 DOA: 01/28/2022 °PCP: Dettinger, Joshua A, MD  ° °Brief Narrative/Hospital Course: °81-year-old male with stage IV pancreatic cancer status post colectomy, hydronephrosis status post left nephrostomy, hypertension, SBO, CKD stage III who presented with abdominal pain, found to have small bowel obstruction status post diagnostic laparoscopy with lysis of adhesion on 2/40 measuring NG suction n.p.o. while waiting for return of bowel function.   ° °complicated by coffee-ground colored secretion from NG tube, leukocytosis. °-Repeat CT scan 2/20 no bowel obstruction, no abscess moderate amount of free fluid in the right upper quadrant, body wall edema noted to the right anterior wall. °With persistent leukocytosis but afebrile, being managed with TPN, IV antibiotics-.   °Patient completed IV Zosyn 2/17-2/22.   °Diet was slowly advanced NG tube removed at this time patient is tolerating diet having output in colostomy bag, having output in nephrostomy tube.  ° ° CT scan 2/21 no intra-abdominal abscess or obstruction but shows enlarging left pleural effusion.   °ID consulted, underwent left pleural diagnostic tap with 50 mL appears exudative fluid but Gram stain negative, total nucleated cells 25,000, neutrophils100 ° °2/24:placed on Zyvox and underwent small bore chest tube placement by PCCM, lytics done 2/25. °2/26-chest tube placed to suction. °2/27 lower leg edema noted more on left-left duplex ordered came back positive for DVT and placed on heparin drip °Ongoing GOC discussion by palliative care-continue with current DNR and full scope of treatment.  °  °Subjective: °Awake coherent seems to be understanding of the gravity of the situation °Sat oob some today °Refusing NG tube feeds--wife and I spoke later and they are discussing this °He doesn't like the Tube ° °Assessment and Plan: ° °* Partial small bowel obstruction (HCC)- (present on admission) °Stage  IV pancreatic cancer with partial SBO s/p  Surgery °CT 2/25 with small abscess in the abdominal wall °Persistent abdominal pain distention nausea vomiting: °S/p LOA, SBR,biopsy 2/14- positive for ACC with mucinous features of left colon mesentery and jejunum. °Off NG tube 2/21, S/P Zosyn  2/17>>2/22. °CT abdomen 2/25-small abscess in the abdominal wall-on wound care °Colostomy having output  °Has persistent abdominal pain distention and nausea vomiting-downgrade to clear liquid diet from soft 2/27, on TPN. °Surgery following closely-he is sick but stable currently °?!?!?!? NG tube feeds--with a goal to what end? ° °Empyema of left pleural space (HCC) °S/P thick purulent drainage- 50 ml by IR 2/23- grew vanc resistant Enterococcus, likely seeding previously known loculated left effusion °S/p small bore chest tube placement 2/24 Dr Alva + lytics 2/25-Not a candidate for surgery. °on Zyvox 2/24 change to PO 2/27> plan to switch to Doxy after 2 weeks-needs at least 4 weeks of antibiotics --TEE scheduled 02/21/22  °Fluid cytology is negative.  °Chest tube removed 3/2 °Rpt CXR in am ° °Acute metabolic encephalopathy °intermittent confusion, multifactorial with deconditioning multiple complex comorbidities.   Communicative.   ° °Thrombocytosis °likely reactive.  See above ° °Leg DVT (deep venous thromboembolism), acute, left (HCC) °Duplex of the left leg + for acute DVT in left mid and distal femoral vein, posterior tibial vein and peroneal vein.  This in the setting of his metastatic cancer, postop status immobility despite being on Lovenox prophylaxis. Started heparin 2/27 °Eventual life-long lovenox. ° °E-coli UTI °Completed antibiotics  ° °Stage 3a chronic kidney disease (CKD) (HCC)- (present on admission) °AKI with creatinine at 1.59 improved to 1.1-1.2 with ivf, tpn.  Trend ° °Colostomy in place (HCC) °See #1 ° °Essential   hypertension- (present on admission) °Controlled, not on meds. ° °Hydronephrosis of left kidney-  (present on admission) °Due to tumor compression,  °s/p left nephrostomy last changed on 1/30, renal ultrasound on 2/17  °-chronic right hydronephrosis with atrophic right kidney, urology advised outpatient follow-up.  Nephrostomy draining well ° °Protein-calorie malnutrition, severe- (present on admission) °Augment nutritional status as tolerated.  Dietitian on board.  On Soft diet/TPN and wean TPN as tolerated °Nutrition Problem: Severe Malnutrition °Etiology: chronic illness, cancer and cancer related treatments °Signs/Symptoms: severe fat depletion, severe muscle depletion, percent weight loss °Interventions: TPN ° °Leukocytosis °Thrombocytosis: °Peaked to 38k-trended down to 18 K but now uptrending, afebrile.  Also has thrombocytosis which is most likely reactive.  On Zyvox.  Leukocytosis is  multifactorial due to patient's empyema and abdominal wall abscess. ° °Malignant neoplasm of tail of pancreas (ypT3ypN1)M1  with metastatic disease- (present on admission) °See #1.Oncology on board.  Dr. Ennever following less likely will be candidate for systemic chemotherapy-oncology to discuss goals of care/prognosis with family I spoke with Dr. Ennever this morning ° °Sacral decub stage 3 °Turn q 2 ° ° °DVT prophylaxis: SCDs Start: 01/29/22 0013. On heparin gtt since 2/27 °Code Status:   Code Status: DNR °Family Communication:plan of care discussed with patient/wife was updated previously.Palliative care met with  FAMILY 2/27.discussed with Dr. Ennever today. ° °Goals of care, counseling/discussion °Patient with metastatic pancreatic cancer now with complicated course and hospitalization appears ill deconditioned with failure to thrive.Remains DNR.  family hoping to hear overall prognosis goals of care plan from oncology. ° ° °Disposition:Currently not medically stable for discharge. °Status is:Inpatient °Remains Inpatient appropriate because: ongoing management of SBO/empyema °  °Objective: °Vitals last 24  hrs: °Vitals:  ° 02/16/22 2100 02/17/22 0415 02/17/22 0500 02/17/22 1434  °BP: 133/87 130/72  102/71  °Pulse: 82 73  79  °Resp: 20 20  16  °Temp: 97.7 °F (36.5 °C) 98.4 °F (36.9 °C)  98.3 °F (36.8 °C)  °TempSrc: Oral Axillary  Oral  °SpO2: 96% 91%  91%  °Weight:   70.6 kg   °Height:      ° °Weight change: 3.2 kg ° °Physical Examination: ° °Awake coherent no distress °S1 s 2no m/r/g °Cta b no added sound °Abd soft nt nd  °Nephrotomy in place °Wound on bottom as below ° ° ° °Medications reviewed:  °Scheduled Meds: ° Chlorhexidine Gluconate Cloth  6 each Topical Daily  ° docusate sodium  100 mg Oral BID  ° dronabinol  5 mg Oral BID AC  ° enoxaparin (LOVENOX) injection  1.5 mg/kg Subcutaneous Q24H  ° feeding supplement  1 Container Oral TID BM  ° feeding supplement  237 mL Oral BID BM  ° insulin aspart  0-15 Units Subcutaneous Q6H  ° lidocaine  1 patch Transdermal Q24H  ° lip balm  1 application Topical BID  ° methocarbamol  500 mg Oral QID  ° OLANZapine  2.5 mg Oral QHS  ° sodium chloride flush  10 mL Other Q8H  ° sodium chloride flush  3 mL Intravenous Q12H  ° °Continuous Infusions: ° sodium chloride Stopped (01/28/22 2301)  ° linezolid (ZYVOX) IV 600 mg (02/17/22 0818)  ° TPN ADULT (ION) 85 mL/hr at 02/17/22 1724  ° ° °Pressure Injury 02/13/22 Coccyx Posterior Stage 3 -  Full thickness tissue loss. Subcutaneous fat may be visible but bone, tendon or muscle are NOT exposed. pink red shallow open area with yellow center (Active)  °02/13/22 (documented for Nicole Atkins RN) 1946  °  Location: Coccyx  °Location Orientation: Posterior  °Staging: Stage 3 -  Full thickness tissue loss. Subcutaneous fat may be visible but bone, tendon or muscle are NOT exposed.  °Wound Description (Comments): pink red shallow open area with yellow center  °Present on Admission: Yes (per Nicole Atkins RN present on assessment)  ° °Diet Order   ° °       °  DIET SOFT Room service appropriate? Yes; Fluid consistency: Thin  Diet effective now        °  ° °  °  ° °  °  ° °Nutrition Problem: Severe Malnutrition °Etiology: chronic illness, cancer and cancer related treatments °Signs/Symptoms: severe fat depletion, severe muscle depletion, percent weight loss °Interventions: Boost Breeze, Ensure Enlive (each supplement provides 350kcal and 20 grams of protein), TPN ° ° °Intake/Output Summary (Last 24 hours) at 02/17/2022 1909 °Last data filed at 02/17/2022 1814 °Gross per 24 hour  °Intake 3195.39 ml  °Output 1865 ml  °Net 1330.39 ml  ° ° °Net IO Since Admission: 9,872.99 mL [02/17/22 1909]  °Wt Readings from Last 3 Encounters:  °02/17/22 70.6 kg  °01/07/22 63.5 kg  °12/31/21 63.5 kg  °  ° ° °Data Reviewed: I have personally reviewed following labs and imaging studies °CBC: °Recent Labs  °Lab 02/13/22 °0623 02/14/22 °0713 02/15/22 °0357 02/16/22 °0358 02/17/22 °0429  °WBC 24.3* 18.4* 20.2* 21.0* 23.4*  °NEUTROABS 20.2* 15.1* 15.3* 15.7* 17.6*  °HGB 10.4* 10.0* 10.3* 9.3* 9.5*  °HCT 33.7* 32.3* 33.2* 29.7* 31.3*  °MCV 96.8 97.0 96.0 96.1 98.4  °PLT 1,086* 1,049* 1,066* 1,053* 1,020*  ° ° °Basic Metabolic Panel: °Recent Labs  °Lab 02/12/22 °0551 02/13/22 °0623 02/14/22 °0713 02/15/22 °0357 02/16/22 °0358 02/17/22 °0429  °NA 141 142 139 141 140 137  °K 3.8 4.5 4.0 4.3 4.1 4.0  °CL 114* 109 109 107 107 103  °CO2 23 26 26 28 27 28  °GLUCOSE 177* 113* 160* 150* 132* 119*  °BUN 55* 52* 46* 48* 46* 46*  °CREATININE 1.19 1.24 1.15 1.25* 1.02 1.26*  °CALCIUM 8.2* 8.5* 7.9* 8.0* 7.8* 7.8*  °MG 2.2  --  2.0 2.1  --  2.1  °PHOS  --   --  3.6 3.5  --  3.6  ° ° °GFR: °Estimated Creatinine Clearance: 46.7 mL/min (A) (by C-G formula based on SCr of 1.26 mg/dL (H)). °Liver Function Tests: °Recent Labs  °Lab 02/13/22 °0623 02/14/22 °0713 02/15/22 °0357 02/16/22 °0358 02/17/22 °0429  °AST 83* 70* 60* 45* 35  °ALT 65* 60* 53* 40 32  °ALKPHOS 224* 210* 228* 191* 177*  °BILITOT 0.3 0.3 0.5 0.1* 0.3  °PROT 5.7* 5.3* 5.7* 5.5* 5.6*  °ALBUMIN <1.5* <1.5* 1.6* <1.5* 1.5*  ° ° °No results for  input(s): LIPASE, AMYLASE in the last 168 hours. °No results for input(s): AMMONIA in the last 168 hours. °Coagulation Profile: °Recent Labs  °Lab 02/14/22 °0950  °INR 1.2  ° ° °Cardiac Enzymes: °No results for input(s): CKTOTAL, CKMB, CKMBINDEX, TROPONINI in the last 168 hours. °BNP (last 3 results) °No results for input(s): PROBNP in the last 8760 hours. °HbA1C: °No results for input(s): HGBA1C in the last 72 hours. °CBG: °Recent Labs  °Lab 02/16/22 °1706 02/17/22 °0033 02/17/22 °0410 02/17/22 °1201 02/17/22 °1652  °GLUCAP 118* 130* 102* 121* 97  ° ° °Lipid Profile: °No results for input(s): CHOL, HDL, LDLCALC, TRIG, CHOLHDL, LDLDIRECT in the last 72 hours. ° ° °Thyroid Function Tests: °No results for input(s): TSH, T4TOTAL, FREET4, T3FREE, THYROIDAB   in the last 72 hours. °Anemia Panel: °Recent Labs  °  02/15/22 °1358  °TIBC 186*  °IRON 32*  ° ° °Sepsis Labs: °Recent Labs  °Lab 02/11/22 °0607 02/12/22 °0551  °PROCALCITON 1.41 1.37  ° ° ° ° °Recent Results (from the past 240 hour(s))  °Body fluid culture w Gram Stain     Status: None (Preliminary result)  ° Collection Time: 02/10/22  1:37 PM  ° Specimen: PATH Cytology Pleural fluid  °Result Value Ref Range Status  ° Specimen Description   Final  °  PLEURAL °Performed at [facility], [address] [place], [place] [address] °  ° Special Requests   Final  °  NONE °Performed at [facility], [address] [place], [place] [address] °  ° Gram Stain FEW WBC SEEN °NO ORGANISMS SEEN °  Final  ° Culture   Final  °  MODERATE ENTEROCOCCUS FAECIUM °VANCOMYCIN RESISTANT ENTEROCOCCUS °CRITICAL RESULT CALLED TO, READ BACK BY AND VERIFIED WITH: RN LISA.P AT 1144 ON 02/11/2022 BY T.SAAD. °Sent to [facility] for further susceptibility testing. °Performed at [facility], [address] [place], [place] [address] °  ° Report Status PENDING  Incomplete  ° Organism ID, Bacteria ENTEROCOCCUS FAECIUM  Final  °    Susceptibility  °  Enterococcus faecium - MIC*  °  AMPICILLIN >=32 RESISTANT Resistant   °  VANCOMYCIN >=32 RESISTANT Resistant   °  GENTAMICIN SYNERGY SENSITIVE Sensitive   °  LINEZOLID 2 SENSITIVE Sensitive   °  * MODERATE ENTEROCOCCUS FAECIUM  ° °  °Antimicrobials: °Anti-infectives (From admission, onward)  ° ° Start     Dose/Rate Route Frequency Ordered Stop  ° 02/16/22 1200  linezolid (ZYVOX) IVPB 600 mg       ° 600 mg °300 mL/hr over 60 Minutes Intravenous Every 12 hours 02/16/22 1115    ° 02/14/22 1015  linezolid (ZYVOX) tablet 600 mg  Status:  Discontinued       ° 600 mg Oral Every 12 hours 02/14/22 0929 02/16/22 1115  ° 02/11/22 1330  linezolid (ZYVOX) IVPB 600 mg  Status:  Discontinued       ° 600 mg °300 mL/hr over 60 Minutes Intravenous Every 12 hours 02/11/22 1234 02/14/22 0929  ° 02/04/22 1545  piperacillin-tazobactam (ZOSYN) IVPB 3.375 g       ° 3.375 g °12.5 mL/hr over 240 Minutes Intravenous Every 8 hours 02/04/22 1453 02/09/22 1359  ° 02/01/22 2200  ceFAZolin (ANCEF) IVPB 1 g/50 mL premix  Status:  Discontinued       ° 1 g °100 mL/hr over 30 Minutes Intravenous Every 8 hours 02/01/22 1504 02/04/22 1453  ° 02/01/22 0930  cefoTEtan (CEFOTAN) 2 g in sodium chloride 0.9 % 100 mL IVPB       ° 2 g °200 mL/hr over 30 Minutes Intravenous On call to O.R. 01/31/22 1908 02/01/22 1533  ° 01/30/22 1000  cefTRIAXone (ROCEPHIN) 1 g in sodium chloride 0.9 % 100 mL IVPB  Status:  Discontinued       ° 1 g °200 mL/hr over 30 Minutes Intravenous Every 24 hours 01/29/22 0013 02/01/22 1504  ° 01/28/22 1830  cefTRIAXone (ROCEPHIN) 1 g in sodium chloride 0.9 % 100 mL IVPB       ° 1 g °200 mL/hr over 30 Minutes Intravenous  Once 01/28/22 1828 01/28/22 1927  ° °  ° °Culture/Microbiology °   °Component Value Date/Time  ° SDES  02/10/2022 1337  °    PLEURAL °Performed at [facility], [address] [place], [place] [address] °  ° SPECREQUEST  02/10/2022 1337  °  NONE °Performed at [facility], [address] [place], [place] [address] °  ° CULT  02/10/2022 1337  °  MODERATE ENTEROCOCCUS FAECIUM °VANCOMYCIN RESISTANT ENTEROCOCCUS °CRITICAL RESULT CALLED TO, READ BACK BY AND VERIFIED WITH: RN LISA.P AT 1144 ON 02/11/2022 BY T.SAAD. °Sent to [facility] for further susceptibility testing. °Performed at [facility], [address] , [place] [address] °  ° REPTSTATUS PENDING 02/10/2022 1337  °Radiology Studies: °DG Chest Port 1 View ° °Result Date: 02/16/2022 °CLINICAL DATA:  Empyema lung EXAM: PORTABLE CHEST - 1 VIEW COMPARISON:  02/14/2022 FINDINGS: Pigtail chest tube stable at the lateral right lung base. Loculated left inferolateral pleural gas in the empyema cavity as before. Extensive airspace consolidation in the left mid and lower lung stable. Some worsening of diffuse a airspace opacities on the right predominantly perihilar. Stable right IJ port catheter. Heart size and mediastinal contours are within normal limits. Visualized bones unremarkable. Left nephrostomy tube and proximal aspect of ureteral stent noted. Metallic GI stent in the left upper abdomen partially visualized. Cholecystectomy clips. IMPRESSION: 1. Persistent loculated gas in the left empyema cavity, with stable chest drain. 2. worsening of  right perihilar airspace opacities. Electronically Signed   By: D  Hassell M.D.   On: 02/16/2022 08:52   ° ° LOS: 19 days  ° °Jai-Gurmukh Saifan Rayford, MD °[facility] ° °02/17/2022, 7:09 PM  °  °

## 2022-02-17 NOTE — Progress Notes (Signed)
Unfortunately, Mr. Bisson just is not eating.  He has a TNA going.  I think that the only way were going to try to get him better is for him to have some kind of enteral nutrition.  I talked to him about that this morning.  I explained why I thought we needed to feed him through the tube.  I told him this was only temporary. ? ?His albumin is only 1.5.  His prealbumin is 11.6.  I do think that we can improve his status with enteral nutrition. ? ?It looks like he is got have a TEE on Monday.  I think this is for the Enterococcus as in the pleural fluid to make sure there is nothing that has seeded his heart valve. ? ?He is just incredibly weak.  Again a lot of this is from lack of nutrition.  I realize that he has his metastatic disease but so far, this has not been all that progressive from what we can tell. ? ?Again I really think that nutrition is going to be the real issue here.  I explained that a feeding tube through his nose could certainly make a big difference for him.  It would provide him a lot more calories.  He has good bowel sounds.  As such, I do not see that there is any problem with him taking enteral nutrition. ? ?He agreed to this.  I know that he was somewhat lethargic when I spoke to him this morning.  However, he did understand what I was talking about. ? ?I just want to give him a chance.  I just want to try to get him home and have him have some quality of life.  Before he came into the hospital, he was doing adequate.  He was independent. ? ?I realize that he has had a lot after his surgery.  I will switch him from heparin over the Lovenox now.  This may help somewhat with too many IVs going into him. ? ?I know this is incredibly complicated.  However, I do think that he does deserve a chance and I want to try to help his much as possible and I think that enteral nutrition is certainly a good way of doing this. ? ?I do appreciate everybody's help.  I know this is very challenging. ? ?Lattie Haw, MD ? ?Romans 10:13 ?

## 2022-02-17 NOTE — Progress Notes (Signed)
? ?NAME:  Kevin Villegas, MRN:  [id number], DOB:  04/17/1941, LOS: 46 ?ADMISSION DATE:  01/28/2022, CONSULTATION DATE:  02/11/2022 ?REFERRING MD:  Dr. Antonieta Pert, CHIEF COMPLAINT:  Pleural effusion  ? ?History of Present Illness:  ?Kevin Villegas is a 81 year old unfortunate man with metastatic pancreatic cancer.  He was admitted 2/9 for recurrent small bowel obstruction and on 2/14 underwent lysis of adhesions, small bowel resection and excision biopsy of mesenteric mass by Dr. 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 ?He was  treated for E. coli UTI ?  ?He also has a history of left hydronephrosis status post nephrostomy tube with stent, chronic right renal atrophy and hydroureteronephrosis ?  ?Review of his serial imaging dating back to November of last year shows metastatic lung nodules with left pleural disease and a small to moderate size left effusion. ?He had persistent leukocytosis and underwent left thoracentesis 2/23 , only 50 cc of thick right purulent fluid was removed. ?Pleural fluid results shows 25K cells with 100% neutrophils, culture showing Enterococcus. ? ?Pertinent  Medical History  ?Metastatic pancreatic cancer, large bowel obstruction resulting in colectomy and colostomy ? ?Significant Hospital Events: ?Including procedures, antibiotic start and stop dates in addition to other pertinent events   ?2/10 admitted  ?2/14 Underwent lysis of adhesions and laparoscopic small bowel resection with Dr. [name] ?2/23 underwent left thoracentesis with 71ml fluid removed ?2/24 Pulmonary consulted for management of loculated pleural effusion , pigtail placed ?2/25 lytics #1 ?2/26 left ex-vacuo pneumothorax ?3/2 chest tube discontinued ? ?Images  ?1.No evidence of postoperative intra-abdominal abscess. Moderate ?right upper quadrant free fluid and small amount of free fluid in ?the pelvis is non organized. ?2. Enteric tube terminates at the gastroesophageal junction. ?3. Left  nephrostomy and left nephroureteral stent in place without ?left hydronephrosis. Chronic right renal atrophy and ?hydroureteronephrosis. ?4. Pulmonary metastatic disease in the lung bases with multiple ?pulmonary nodules. Enlarging left pleural effusion with pleural ?thickening, possibly metastatic. Moderate-sized right pleural ?effusion is new. ?5. Diffuse body wall edema. ?6. Soft tissue nodule in the right anterior abdominal wall is ?unchanged, possibly metastatic. ? ? ?Interim History / Subjective:  ?Chest tube discontinued by myself. ?Tolerated fine. ?Remains globally weak though vitals stable. ? ?Objective   ?Blood pressure 130/72, pulse 73, temperature 98.4 ?F (36.9 ?C), temperature source Axillary, resp. rate 20, height 5\' 11"  (1.803 m), weight 70.6 kg, SpO2 91 %. ?   ?   ? ?Intake/Output Summary (Last 24 hours) at 02/17/2022 1052 ?Last data filed at 02/17/2022 0600 ?Gross per 24 hour  ?Intake 2316.82 ml  ?Output 1045 ml  ?Net 1271.82 ml  ? ? ?Filed Weights  ? 02/15/22 0500 02/16/22 0500 02/17/22 0500  ?Weight: 69.9 kg 67.4 kg 70.6 kg  ? ? ?Examination: ?General: chronically ill appearing, deconditioned frail elderly male, lying in bed in NAD ?HEENT: [place]/AT, MM pink/moist, EOMI ?Neuro: Globally weak but able to MAE's ?CV: RRR, no M/R/G ?PULM: CTAB though diminished bilaterally esp in bases ?GI: soft, bowel sounds active in all 4 quadrants, non-tender, slightly distended, left nephrostomy tube  ?Extremities: warm/dry, no edema  ?Skin: no rashes or lesions ? ?Chest x-ray 3/1 independently reviewed shows ex-vacuo pneumothorax is persistent, consolidation in the left lung and on the right with some worsening ? ?Resolved Hospital Problem list   ? ? ?Assessment & Plan:  ?Left empyema -with Enterococcus likely seeding previously known loculated left effusion, likely malignant ?-CT ABD pelvis revealed pulmonary metastatic disease in the lung bases  with multiple pulmonary nodules. Enlarging left pleural effusion with  pleural thickening, possibly metastatic and moderate-sized right pleural effusion ?-IR preformed left thoracentesis 2/23 with 50 ml of light red/purulent/thick fluid removed  ?Ex-vacuo pneumothorax status post pigtail likely represents trapped lung  ?Pleural fluid cytology is negative ?P: ?Continue Zyvox. ?Further lytics not indicated and in fact we have discontinued his chest tube today given minimal output. ?Routine bronchial hygiene. ?Mobilize as able. ? ?Acute LLE DVT - on Lovenox ?P: ?Continue Lovenox. ? ?Rest per primary team. ? ?Palliative conversations ongoing, palliative care, surgery, oncology following. ?Nothing further from our standpoint. PCCM will sign off.  Please call us back if we can be of any further assistance. ? ? ?Best Practice (right click and "Reselect all SmartList Selections" daily)  ?Per primary  ? ? ?Montey Hora, PA - C ?[facility] ?For pager details, please see AMION or use Epic chat  ?After 1900, please call [facility] for cross coverage needs ?02/17/2022, 10:55 AM ? ? ? ? ? ?

## 2022-02-17 NOTE — Progress Notes (Signed)
Patient ID: Kevin Villegas, male   DOB: June 26, 1941, 81 y.o.   MRN: [id number] ?[facility] ?Progress Note ? ?16 Days Post-Op  ?Subjective: ?CC-  ?Complaining of lower abdominal pain at port site infection. Otherwise abdomen feels ok. He denies any n/v. Colostomy productive. Tolerating full liquids but not eating a lot. States that he is drinking 1-2 Ensure daily. ? ?Objective: ?Vital signs in last 24 hours: ?Temp:  [97.7 ?F (36.5 ?C)-98.4 ?F (36.9 ?C)] 98.4 ?F (36.9 ?C) (03/02 0415) ?Pulse Rate:  [73-82] 73 (03/02 0415) ?Resp:  [18-20] 20 (03/02 0415) ?BP: (130-147)/(72-88) 130/72 (03/02 0415) ?SpO2:  [91 %-97 %] 91 % (03/02 0415) ?Weight:  [70.6 kg] 70.6 kg (03/02 0500) ?Last BM Date : 02/16/22 ? ?Intake/Output from previous day: ?03/01 0701 - 03/02 0700 ?In: 2655.2 [P.O.:270; I.V.:1849; IV Piggyback:516.2] ?Out: 1045 [Urine:875; Stool:30; Chest Tube:140] ?Intake/Output this shift: ?No intake/output data recorded. ? ?PE: ?Gen:  Alert, NAD ?Abd: distended but overall soft, mostly suprapubic tenderness at port site infection - overall this looks better, opening is larger with less cellulitis and fluctuance. ostomy viable with some soft brown stool in pouch ? ?Lab Results:  ?Recent Labs  ?  02/16/22 ?0358 02/17/22 ?0429  ?WBC 21.0* 23.4*  ?HGB 9.3* 9.5*  ?HCT 29.7* 31.3*  ?PLT 1,053* 1,020*  ? ?BMET ?Recent Labs  ?  02/16/22 ?0358 02/17/22 ?0429  ?NA 140 137  ?K 4.1 4.0  ?CL 107 103  ?CO2 27 28  ?GLUCOSE 132* 119*  ?BUN 46* 46*  ?CREATININE 1.02 1.26*  ?CALCIUM 7.8* 7.8*  ? ?PT/INR ?No results for input(s): LABPROT, INR in the last 72 hours. ?CMP  ?   ?Component Value Date/Time  ? NA 137 02/17/2022 0429  ? NA 139 12/31/2021 1127  ? K 4.0 02/17/2022 0429  ? CL 103 02/17/2022 0429  ? CO2 28 02/17/2022 0429  ? GLUCOSE 119 (H) 02/17/2022 0429  ? BUN 46 (H) 02/17/2022 0429  ? BUN 38 (H) 12/31/2021 1127  ? CREATININE 1.26 (H) 02/17/2022 0429  ? CREATININE 1.58 (H) 11/18/2021 1149  ? CALCIUM 7.8 (L) 02/17/2022  0429  ? PROT 5.6 (L) 02/17/2022 0429  ? PROT 7.2 12/31/2021 1127  ? ALBUMIN 1.5 (L) 02/17/2022 0429  ? ALBUMIN 3.9 12/31/2021 1127  ? AST 35 02/17/2022 0429  ? AST 44 (H) 11/18/2021 1149  ? ALT 32 02/17/2022 0429  ? ALT 45 (H) 11/18/2021 1149  ? ALKPHOS 177 (H) 02/17/2022 0429  ? BILITOT 0.3 02/17/2022 0429  ? BILITOT 0.3 12/31/2021 1127  ? BILITOT 0.3 11/18/2021 1149  ? GFRNONAA 58 (L) 02/17/2022 0429  ? GFRNONAA 44 (L) 11/18/2021 1149  ? GFRAA >60 11/24/2019 1600  ? ?Lipase  ?   ?Component Value Date/Time  ? LIPASE 42 01/28/2022 1500  ? ? ? ? ? ?Studies/Results: ?DG Chest Port 1 View ? ?Result Date: 02/16/2022 ?CLINICAL DATA:  Empyema lung EXAM: PORTABLE CHEST - 1 VIEW COMPARISON:  02/14/2022 FINDINGS: Pigtail chest tube stable at the lateral right lung base. Loculated left inferolateral pleural gas in the empyema cavity as before. Extensive airspace consolidation in the left mid and lower lung stable. Some worsening of diffuse a airspace opacities on the right predominantly perihilar. Stable right IJ port catheter. Heart size and mediastinal contours are within normal limits. Visualized bones unremarkable. Left nephrostomy tube and proximal aspect of ureteral stent noted. Metallic GI stent in the left upper abdomen partially visualized. Cholecystectomy clips. IMPRESSION: 1. Persistent loculated gas in the left empyema cavity,  with stable chest drain. 2. worsening of  right perihilar airspace opacities. Electronically Signed   By: Lucrezia Europe M.D.   On: 02/16/2022 08:52   ? ?Anti-infectives: ?Anti-infectives (From admission, onward)  ? ? Start     Dose/Rate Route Frequency Ordered Stop  ? 02/16/22 1200  linezolid (ZYVOX) IVPB 600 mg       ? 600 mg ?300 mL/hr over 60 Minutes Intravenous Every 12 hours 02/16/22 1115    ? 02/14/22 1015  linezolid (ZYVOX) tablet 600 mg  Status:  Discontinued       ? 600 mg Oral Every 12 hours 02/14/22 0929 02/16/22 1115  ? 02/11/22 1330  linezolid (ZYVOX) IVPB 600 mg  Status:   Discontinued       ? 600 mg ?300 mL/hr over 60 Minutes Intravenous Every 12 hours 02/11/22 1234 02/14/22 0929  ? 02/04/22 1545  piperacillin-tazobactam (ZOSYN) IVPB 3.375 g       ? 3.375 g ?12.5 mL/hr over 240 Minutes Intravenous Every 8 hours 02/04/22 1453 02/09/22 1359  ? 02/01/22 2200  ceFAZolin (ANCEF) IVPB 1 g/50 mL premix  Status:  Discontinued       ? 1 g ?100 mL/hr over 30 Minutes Intravenous Every 8 hours 02/01/22 1504 02/04/22 1453  ? 02/01/22 0930  cefoTEtan (CEFOTAN) 2 g in sodium chloride 0.9 % 100 mL IVPB       ? 2 g ?200 mL/hr over 30 Minutes Intravenous On call to O.R. 01/31/22 1908 02/01/22 1533  ? 01/30/22 1000  cefTRIAXone (ROCEPHIN) 1 g in sodium chloride 0.9 % 100 mL IVPB  Status:  Discontinued       ? 1 g ?200 mL/hr over 30 Minutes Intravenous Every 24 hours 01/29/22 0013 02/01/22 1504  ? 01/28/22 1830  cefTRIAXone (ROCEPHIN) 1 g in sodium chloride 0.9 % 100 mL IVPB       ? 1 g ?200 mL/hr over 30 Minutes Intravenous  Once 01/28/22 1828 01/28/22 1927  ? ?  ? ? ? ?Assessment/Plan ?Stage IV pancreatic cancer with psbo  ?- POD#16 s/p Lap LOA, Small bowel resection, excisional biopsy of mesentery mass by Dr. 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 Decrease in left pleural effusion after chest tube placement ?- WBC is stable elevated at 23, multiple sources. scheduled for TEE on Monday. Most recent abdominal CT 5 days ago only showed the port site infection (and this is improving), no intraabdomianl abscess. I do not think we need to repeat any imaging of his abdomen at this point given no intraabdominal infection on last CT scan. ?- Lower midline wound infection improving, continue packing suprapubic port site ?- Tolerating fulls and having some  bowel function. Advance to soft diet and encourage PO intake. Oncology has ordered Cortrak/ tube feedings for this patient. Nutrition per them. ?- Unfortunately the patient is not progressing well. He has had multiple complications and does not look well. He has metastatic cancer. No option for further surgery at this point. Palliative team is following, appreciate their assistance.  ?  ?FEN: TPN, soft diet ?ID: cefotetan periop, ancef (UTI), zosyn 2/17>2/21. Linezolid 2/24>> ?VTE: heparin gtt ?  ?Left leg DVT on heparin ?Left empyema s/p chest tube ?E coli UTI - completed abx ?CKD 3A ?HTN ?Code status DNR -  palliative following ?Severe protein calorie malnutrition ? ? ? LOS: 19 days  ? ? ?Wellington Hampshire, PA-C ?[facility] ?02/17/2022, 11:04 AM ?Please see Amion for pager number during day hours 7:00am-4:30pm ? ?

## 2022-02-17 NOTE — Progress Notes (Signed)
? ?                                                                                                                                                     ?                                                   ?Daily Progress Note  ? ?Patient Name: Kevin Villegas       Date: 02/17/2022 ?DOB: 12/13/1941  Age: 81 y.o. MRN#: [id number] ?Attending Physician: Nita Sells, MD ?Primary Care Physician: Dettinger, [name], MD ?Admit Date: 01/28/2022 ? ?Reason for Consultation/Follow-up: Establishing goals of care ? ?Subjective: ?I met today with Kevin Villegas, also discussed with his bedside RN. He is resting in bed, recalls meeting with Dr Marin Olp earlier this morning.  ?  ? ?Length of Stay: 19 ? ?Current Medications: ?Scheduled Meds:  ? Chlorhexidine Gluconate Cloth  6 each Topical Daily  ? docusate sodium  100 mg Oral BID  ? dronabinol  5 mg Oral BID AC  ? enoxaparin (LOVENOX) injection  1.5 mg/kg Subcutaneous Q24H  ? feeding supplement  1 Container Oral TID BM  ? feeding supplement  237 mL Oral BID BM  ? insulin aspart  0-15 Units Subcutaneous Q6H  ? lidocaine  1 patch Transdermal Q24H  ? lip balm  1 application Topical BID  ? methocarbamol  500 mg Oral QID  ? OLANZapine  2.5 mg Oral QHS  ? sodium chloride flush  10 mL Other Q8H  ? sodium chloride flush  3 mL Intravenous Q12H  ? ? ?Continuous Infusions: ? sodium chloride Stopped (01/28/22 2301)  ? ferumoxytol    ? linezolid (ZYVOX) IV 600 mg (02/17/22 0818)  ? TPN ADULT (ION) 85 mL/hr at 02/16/22 1712  ? ? ?PRN Meds: ?sodium chloride, acetaminophen, alum & mag hydroxide-simeth, antiseptic oral rinse, diphenhydrAMINE **OR** diphenhydrAMINE, fentaNYL (SUBLIMAZE) injection, iohexol, menthol-cetylpyridinium, ondansetron (ZOFRAN) IV, oxyCODONE, phenol, prochlorperazine, simethicone, sodium chloride flush ? ?         ?Appears chronically ill  ?Resting in bed ?No edema ?Awake alert ?S 1 S 2  ?Tender over lower abdomen mild to moderate.  ? ?Vital Signs: BP 130/72 (BP Location:  Right Arm)   Pulse 73   Temp 98.4 ?F (36.9 ?C) (Axillary)   Resp 20   Ht 5' 11"  (1.803 m)   Wt 70.6 kg   SpO2 91%   BMI 21.71 kg/m?  ?SpO2: SpO2: 91 % ?O2 Device: O2 Device: Room Air ?O2 Flow Rate: O2 Flow Rate (L/min): 2 L/min ? ?Intake/output summary:  ?Intake/Output Summary (Last 24 hours) at 02/17/2022 1150 ?Last data filed at 02/17/2022 0600 ?Gross per 24 hour  ?  Intake 2316.82 ml  ?Output 1045 ml  ?Net 1271.82 ml  ? ? ?LBM: Last BM Date : 02/16/22 ?Baseline Weight: Weight: 57 kg ?Most recent weight: Weight: 70.6 kg ? ?     ?Palliative Assessment/Data: ? ? ? ? ? ?Patient Active Problem List  ? Diagnosis Date Noted  ? Pressure injury of skin 02/15/2022  ? Thrombocytosis 02/15/2022  ? Acute metabolic encephalopathy 12/87/8676  ? Leg DVT (deep venous thromboembolism), acute, left ([place]) 02/14/2022  ? Leg edema, left 02/13/2022  ? Empyema lung ([place])   ? Goals of care, counseling/discussion 02/10/2022  ? Empyema of left pleural space ([place]) 02/10/2022  ? E-coli UTI 02/01/2022  ? SBO (small bowel obstruction) ([place]) 01/28/2022  ? Subcutaneous nodule of abdominal wall 01/09/2022  ? Partial small bowel obstruction ([place]) 01/07/2022  ? COVID-19 virus infection 01/07/2022  ? Colostomy in place [facility]) 11/10/2021  ? Stage 3a chronic kidney disease (CKD) ([place]) 11/10/2021  ? Pyelonephritis   ? Essential hypertension 10/30/2021  ? Hydronephrosis of left kidney 10/28/2021  ? Ureteral stricture, left 10/27/2021  ? Protein-calorie malnutrition, severe 06/12/2021  ? Hydroureter on right 06/10/2021  ? Aortic atherosclerosis ([place]) 06/10/2021  ? Leukocytosis 06/02/2021  ? Right ureteral stone 06/02/2021  ? Colon stricture ([place])   ? Pancreatic mass 06/27/2019  ? 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 Rhinitis medicamentosa 02/26/2019  ? ? ?Palliative Care Assessment & Plan  ? ?Patient Profile: ?  ? ?Assessment: ? 81 year old gentleman who lives at home with his wife in [place], [place].  His daughter lives nearby.  He has a past medical history of stage IV pancreatic cancer, status post colectomy, hydronephrosis status post nephrostomy, underlying hypertension small bowel obstruction stage II chronic kidney disease.  Patient presented with ongoing abdominal pain, known to have concerns for small bowel obstruction issues.  Underwent a diagnostic laparoscopy with lysis of additions on 2-14.  He is being followed by general surgery and medical oncology.  Has been placed on TPN for nutritional support.  Has been placed on NG tube for suction.  So far, patient has not not had any bowel movements or passed flatus. ?Palliative medicine team has been consulted for CODE STATUS and goals of care discussions and for additional support. ? ?Recommendations/Plan: ?DNR ?Continue current mode of care.  Patient is considering NGT and tube feeds. He is also trying to increase PO intake. Remains on PRN IV pain medication. Surgery and oncology colleagues following.  ?No new PMT specific recommendations at this time. Monitor hospital course and his overall disease trajectory of illness so as to help guide further decision making.    ? ?Code Status: ? ?  ?Code Status Orders  ?(From admission, onward)  ?  ? ? ?  ? ?  Start     Ordered  ? 02/06/22 1335  Do not attempt resuscitation (DNR)  Continuous       ?Question Answer Comment  ?In the event of cardiac or respiratory ARREST Do not call a ?code blue?   ?In the event of cardiac or respiratory ARREST Do not perform Intubation, CPR, defibrillation or ACLS   ?In the event of cardiac or respiratory ARREST Use medication by any route, position, wound care, and other measures to relive pain and suffering. May use oxygen, suction and manual treatment of airway obstruction as needed for comfort.   ?  ? 02/06/22 1334  ? ?  ?  ? ?  ? ?Code  Status History   ? ? Date Active Date Inactive Code Status Order ID Comments User Context  ? 01/29/2022 0014 02/06/2022 1334 Full Code  [id number]  Marcelyn Bruins, MD Inpatient  ? 01/07/2022 2129 01/10/2022 2258 Full Code [id number]  Rise Patience, MD Inpatient  ? 12/10/2021 1331 12/11/2021 2137 Full Code [id number]  Jonnie Finner, DO ED  ? 11/10/2021 0354 11/13/2021 2039 Full Code [id number]  Chotiner, Yevonne Aline, MD Inpatient  ? 10/27/2021 2317 10/30/2021 1833 Full Code [id number]  Lenore Cordia, MD Inpatient  ? 06/09/2021 2201 06/18/2021 1923 Full Code [id number]  Lenore Cordia, MD Inpatient  ? 06/01/2021 2102 06/05/2021 1830 Full Code [id number]  Rhetta Mura, DO ED  ? 05/18/2021 1200 05/26/2021 1958 Partial Code [id number]  Jonnie Finner, DO Inpatient  ? 12/12/2014 1730 12/17/2014 1704 Full Code [id number]  Velvet Bathe, MD Inpatient  ? ?  ? ? ?Prognosis: ? Guarded  ? ?Discharge Planning: ?To Be Determined ? ?Care plan was discussed with patient. ? ?Total time: 25 minutes ? ?Thank you for allowing the Palliative Medicine Team to assist in the care of this patient. ?Loistine Chance, MD ? ?Please contact Palliative Medicine Team phone at [phone] for questions and concerns.  ? ? ? ? ? ? ? ? ?

## 2022-02-17 NOTE — Progress Notes (Signed)
PHARMACY - TOTAL PARENTERAL NUTRITION CONSULT NOTE  ? ?Indication: Small bowel obstruction and inability to meet nutritional needs w/ PO intake ? ?Patient Measurements: ?Height: _0  (180.3 cm) ?Weight: 70.6 kg (155 lb 10.3 oz) ?IBW/kg (Calculated) : 75.3 ?TPN AdjBW (KG): 57.7 ?Body mass index is 21.71 kg/m?. ?Usual Weight: Weight noted in November 2022 to be 63.5 kg ? ?Assessment: 81 yo male presenting to [facility] ED for abdominal pain, nausea, vomiting. PMH includes pancreatic cancer with evidence of metastatic disease to his bladder, lungs, and pleural space and history of SBO.  S/p stent placement between stomach and intestines and colostomy placement in June 2022. Pharmacy is consulted for TPN management for SBO and inability to meet nutritional needs w/ PO intake.  ? ?Glucose / Insulin: No hx DM; CBGs at goal on moderate SSI q6h ?- 2 units SSI used in past 24 hours ?Electrolytes: All, including Corrected Calcium, WNL ?Renal: SCr increased to 1.26, BUN elevated at 46 ?Hepatic: AST/ALT improved to WNL. Alk Phos elevated, but trending down. Tbili WNL. Albumin low. Prealbumin low but improved to 11.6 (2/28). TG WNL, 130 (2/27) ?Intake / Output; MIVF: I/O 2655/1045; no IVF ?-Chest tube output/drainage decreased to 151m > chest tube discontinued today  ?-UOP 8749m(nephrostomy) ?-Oral intake charted: 27072miquids  ?GI Imaging: ?- 2/10 CT abdomen pelvis: re-demonstration of high-grade partial SBO with transition point in the upper left pelvis psych likely due to adhesion.  ?- 2/20 abd CT: No evidence of postoperative intra-abdominal abscess. BL pleural effusions. Pulmonary metastatic disease in the lung bases with multiple pulmonary nodules. ?- 2/25 CT: worsened SB loop dilation suggesting ileus or pSBO ?GI Surgeries / Procedures:  ?- 2/14: laparoscopic lysis of adhesions, small bowel resection, excisional bipsy of mesentery mass  ? ?Central access: PAC ?TPN start date: 2/12 ? ?Nutritional Goals: ?Goal TPN rate is 85  mL/hr (provides 112g of protein and 2061 kcals per day) ? ?RD Assessment:  ?Estimated Needs ?Total Energy Estimated Needs: 2050-2250 kcal ?Total Protein Estimated Needs: 105-120 grams ?Total Fluid Estimated Needs: >/= 2 L/day ? ?Current Nutrition:  ?Full liquid diet > soft diet ?Boost Breeze TID (refusing most) ?Ensure Plus BID (accepted 1 this AM) ?TPN ? ?Consideration of NGT/tube feeds per providers ?Calorie count planned for 3/3 ? ?Plan:  ?At 1800: ?- Continue full-rate TPN at 85 ml/hr per discussion with providers ?- Electrolytes in TPN: no change from yesterday ?Na 100m63m ?K 30mE68m?Ca 0mEq/14mMg 5 mEq/L ?Phos 10mmol42mCl:Ac max Ac ?- Add trace elements daily to TPN. Will add multivitamins M/W/F only in TPN due to national shortage.    ?- Continue moderate SSI q6h and adjust as needed ?- TPN labs on Mon/Thurs ?- f/u plans for NGT placement/tube feeds, calorie count, advancement of diet, weaning of TPN  ? ? ?Jaice Digioia GLindell SparD, BCPS ?Clinical Pharmacist  ?02/17/2022, 10:57 AM ? ?

## 2022-02-18 ENCOUNTER — Encounter (HOSPITAL_COMMUNITY)
Admission: EM | Disposition: A | Discharge status: Hospice | Payer: Self-pay | Source: Home / Self Care | Attending: Internal Medicine

## 2022-02-18 ENCOUNTER — Other Ambulatory Visit (HOSPITAL_COMMUNITY): Payer: Medicare Other

## 2022-02-18 ENCOUNTER — Encounter: Payer: Self-pay | Admitting: *Deleted

## 2022-02-18 ENCOUNTER — Inpatient Hospital Stay (HOSPITAL_COMMUNITY): Payer: Medicare Other

## 2022-02-18 ENCOUNTER — Encounter (HOSPITAL_COMMUNITY): Payer: Self-pay | Admitting: Anesthesiology

## 2022-02-18 DIAGNOSIS — I82403 Acute embolism and thrombosis of unspecified deep veins of lower extremity, bilateral: Secondary | ICD-10-CM

## 2022-02-18 LAB — CBC WITH DIFFERENTIAL/PLATELET
Abs Immature Granulocytes: 0.75 10*3/uL — ABNORMAL HIGH (ref 0.00–0.07)
Basophils Absolute: 0.2 10*3/uL — ABNORMAL HIGH (ref 0.0–0.1)
Basophils Relative: 1 %
Eosinophils Absolute: 0.6 10*3/uL — ABNORMAL HIGH (ref 0.0–0.5)
Eosinophils Relative: 3 %
HCT: 31.7 % — ABNORMAL LOW (ref 39.0–52.0)
Hemoglobin: 9.8 g/dL — ABNORMAL LOW (ref 13.0–17.0)
Immature Granulocytes: 3 %
Lymphocytes Relative: 9 %
Lymphs Abs: 2 10*3/uL (ref 0.7–4.0)
MCH: 30.4 pg (ref 26.0–34.0)
MCHC: 30.9 g/dL (ref 30.0–36.0)
MCV: 98.4 fL (ref 80.0–100.0)
Monocytes Absolute: 2 10*3/uL — ABNORMAL HIGH (ref 0.1–1.0)
Monocytes Relative: 9 %
Neutro Abs: 16.6 10*3/uL — ABNORMAL HIGH (ref 1.7–7.7)
Neutrophils Relative %: 75 %
Platelets: 1003 10*3/uL (ref 150–400)
RBC: 3.22 MIL/uL — ABNORMAL LOW (ref 4.22–5.81)
RDW: 17.6 % — ABNORMAL HIGH (ref 11.5–15.5)
WBC: 22 10*3/uL — ABNORMAL HIGH (ref 4.0–10.5)
nRBC: 0.1 % (ref 0.0–0.2)

## 2022-02-18 LAB — GLUCOSE, CAPILLARY
Glucose-Capillary: 108 mg/dL — ABNORMAL HIGH (ref 70–99)
Glucose-Capillary: 120 mg/dL — ABNORMAL HIGH (ref 70–99)
Glucose-Capillary: 93 mg/dL (ref 70–99)

## 2022-02-18 SURGERY — ECHOCARDIOGRAM, TRANSESOPHAGEAL
Anesthesia: Monitor Anesthesia Care

## 2022-02-18 MED ORDER — SODIUM CHLORIDE 0.9 % IV SOLN: 510.0000 mg | Freq: Once | INTRAVENOUS | Status: DC

## 2022-02-18 MED ORDER — TRAVASOL 10 % IV SOLN
INTRAVENOUS | Status: DC
  Filled 2022-02-18: qty 1122

## 2022-02-18 NOTE — Progress Notes (Signed)
PT Cancellation Note ? ?Patient Details ?Name: Kevin Villegas ?MRN: [id number] ?DOB: January 30, 1941 ? ? ?Cancelled Treatment:    Reason Eval/Treat Not Completed: Patient at procedure or test/unavailable. Xray in room, per notes more imaging ordered for abdominal distention. Will check back as able. ? ? ? ?Talbot Grumbling PT, DPT ?02/18/22, 11:00 AM ? ? ?

## 2022-02-18 NOTE — Interval H&P Note (Signed)
History and Physical Interval Note: ? ?02/18/2022 ?10:48 AM ? ?Kevin Villegas  has presented today for surgery, with the diagnosis of BACTEREMIA.  The various methods of treatment have been discussed with the patient and family. After consideration of risks, benefits and other options for treatment, the patient has consented to  Procedure(s): ?TRANSESOPHAGEAL ECHOCARDIOGRAM (TEE) (N/A) as a surgical intervention.  The patient's history has been reviewed, patient examined, no change in status, stable for surgery.  I have reviewed the patient's chart and labs.  Questions were answered to the patient's satisfaction.   ? ? ?Dorthy Magnussen ? ? ?

## 2022-02-18 NOTE — Progress Notes (Signed)
? ? ?  [facility] has been requested to perform a transesophageal echocardiogram on Kevin Villegas for bacteremia.  After careful review of history and examination, the risks and benefits of transesophageal echocardiogram have been explained including risks of esophageal damage, perforation (1:10,000 risk), bleeding, pharyngeal hematoma as well as other potential complications associated with conscious sedation including aspiration, arrhythmia, respiratory failure and death. Alternatives to treatment were discussed, questions were answered. Patient is willing to proceed.  ? ?Pt is scheduled for TEE on 02/21/22 at 11:30 AM with Dr. Harriet Masson. NPO at MN / hold tube feeds if running Sunday night.  ? ?Ledora Bottcher, PA  ?02/18/2022 10:36 AM  ? ?

## 2022-02-18 NOTE — Progress Notes (Signed)
PHARMACY - TOTAL PARENTERAL NUTRITION CONSULT NOTE  ? ?Indication: Small bowel obstruction and inability to meet nutritional needs w/ PO intake ? ?Patient Measurements: ?Height: 5' 11"  (180.3 cm) ?Weight: 70 kg (154 lb 5.2 oz) ?IBW/kg (Calculated) : 75.3 ?TPN AdjBW (KG): 57.7 ?Body mass index is 21.52 kg/m?. ?Usual Weight: Weight noted in November 2022 to be 63.5 kg ? ?Assessment: 81 yo male presenting to [facility] ED for abdominal pain, nausea, vomiting. PMH includes pancreatic cancer with evidence of metastatic disease to his bladder, lungs, and pleural space and history of SBO.  S/p stent placement between stomach and intestines and colostomy placement in June 2022. Pharmacy is consulted for TPN management for SBO and inability to meet nutritional needs w/ PO intake.  ? ?Glucose / Insulin: No hx DM ?- on moderate SSI q6h (24 units SSI used in past 24 hours) ?- CBGs <150 ?Electrolytes: All, including Corrected Calcium, WNL with labs on 3/2 ?Renal: SCr increased to 1.26, BUN elevated at 46 with labs on 3/2 ?Hepatic: AST/ALT improved to WNL. Alk Phos elevated, but trending down. Tbili WNL. Albumin low. Prealbumin low but improved to 11.6 (2/28). TG WNL, 130 (2/27) ?Intake / Output; MIVF:  no IVF ?- I/O: + 1311 ml ?-Chest tube output/drainage decreased to 161m > chest tube discontinued today  ?-UOP 1700 mL (nephrostomy) ?-Oral intake charted: 280 mL liquids  ?GI Imaging: ?- 2/10 CT abdomen pelvis: re-demonstration of high-grade partial SBO with transition point in the upper left pelvis psych likely due to adhesion.  ?- 2/20 abd CT: No evidence of postoperative intra-abdominal abscess. BL pleural effusions. Pulmonary metastatic disease in the lung bases with multiple pulmonary nodules. ?- 2/25 CT: worsened SB loop dilation suggesting ileus or pSBO ?GI Surgeries / Procedures:  ?- 2/14: laparoscopic lysis of adhesions, small bowel resection, excisional bipsy of mesentery mass  ? ?Central access: PAC ?TPN start date:  2/12 ? ?Nutritional Goals: ?Goal TPN rate is 85 mL/hr (provides 112g of protein and 2061 kcals per day) ? ?RD Assessment:  ?Estimated Needs ?Total Energy Estimated Needs: 2050-2250 kcal ?Total Protein Estimated Needs: 105-125 grams ?Total Fluid Estimated Needs: >/= 2.2 L/day ? ?Current Nutrition:  ?Full liquid diet > soft diet ?Boost Breeze TID (refusing most) ?Ensure Plus BID (accepted 1 this AM) ?TPN ? ?Consideration of NGT/tube feeds per providers ?Calorie count planned for 3/3 ? ?Plan:  ?At 1800: ?- Continue full-rate TPN at 85 ml/hr per discussion with providers ?- Electrolytes in TPN: no change from yesterday ?Na 1041m/L ?K 3014mL ?Ca 0mE73m ?Mg 5 mEq/L ?Phos 10mm77m ?Cl:Ac max Ac ?- Add trace elements daily to TPN. Will add multivitamins M/W/F only in TPN due to national shortage.    ?- Continue moderate SSI q6h and adjust as needed ?- TPN labs on Mon/Thurs ?- bmet, mag and phos on 02/19/22 ?- f/u plans for NGT placement/tube feeds, calorie count, advancement of diet, weaning of TPN  ? ? ?Kahmari Herard PDia SitterrmD, BCPS ?02/18/2022 8:00 AM ? ?

## 2022-02-18 NOTE — Progress Notes (Addendum)
? ?                                                                                                                                                     ?                                                   ?Daily Progress Note  ? ?Patient Name: Kevin Villegas       Date: 02/18/2022 ?DOB: December 16, 1941  Age: 81 y.o. MRN#: [id number] ?Attending Physician: Nita Sells, MD ?Primary Care Physician: Dettinger, [name], MD ?Admit Date: 01/28/2022 ? ?Reason for Consultation/Follow-up: Establishing goals of care ? ?Subjective: ?Resting in bed, abdominal pain ?  ? ?Length of Stay: 20 ? ?Current Medications: ?Scheduled Meds:  ? Chlorhexidine Gluconate Cloth  6 each Topical Daily  ? docusate sodium  100 mg Oral BID  ? dronabinol  5 mg Oral BID AC  ? enoxaparin (LOVENOX) injection  1.5 mg/kg Subcutaneous Q24H  ? feeding supplement  1 Container Oral TID BM  ? feeding supplement  237 mL Oral BID BM  ? insulin aspart  0-15 Units Subcutaneous Q6H  ? lidocaine  1 patch Transdermal Q24H  ? lip balm  1 application Topical BID  ? methocarbamol  500 mg Oral QID  ? OLANZapine  2.5 mg Oral QHS  ? sodium chloride flush  10 mL Other Q8H  ? sodium chloride flush  3 mL Intravenous Q12H  ? ? ?Continuous Infusions: ? sodium chloride Stopped (01/28/22 2301)  ? [START ON 02/20/2022] ferumoxytol    ? linezolid (ZYVOX) IV 600 mg (02/18/22 1046)  ? TPN ADULT (ION) 85 mL/hr at 02/17/22 1724  ? TPN ADULT (ION)    ? ? ?PRN Meds: ?sodium chloride, acetaminophen, alum & mag hydroxide-simeth, antiseptic oral rinse, diphenhydrAMINE **OR** diphenhydrAMINE, fentaNYL (SUBLIMAZE) injection, iohexol, menthol-cetylpyridinium, ondansetron (ZOFRAN) IV, oxyCODONE, phenol, prochlorperazine, simethicone ? ?         ?Appears chronically ill  ?Resting in bed ?No edema ?Awake alert ?S 1 S 2  ?Tender over lower abdomen mild to moderate.  ? ?Vital Signs: BP 130/85 (BP Location: Right Arm)   Pulse 83   Temp 97.8 ?F (36.6 ?C) (Oral)   Resp 20   Ht 5\' 11"  (1.803 m)   Wt  70 kg   SpO2 94%   BMI 21.52 kg/m?  ?SpO2: SpO2: 94 % ?O2 Device: O2 Device: Nasal Cannula ?O2 Flow Rate: O2 Flow Rate (L/min): 2 L/min ? ?Intake/output summary:  ?Intake/Output Summary (Last 24 hours) at 02/18/2022 1456 ?Last data filed at 02/18/2022 1417 ?Gross per 24 hour  ?Intake 2881.83 ml  ?Output 2400 ml  ?Net 481.83 ml  ? ?  LBM: Last BM Date : 02/16/22 ?Baseline Weight: Weight: 57 kg ?Most recent weight: Weight: 70 kg ? ?     ?Palliative Assessment/Data: ? ? ? ? ? ?Patient Active Problem List  ? Diagnosis Date Noted  ? Pressure injury of skin 02/15/2022  ? Thrombocytosis 02/15/2022  ? Acute metabolic encephalopathy 83/15/1761  ? Leg DVT (deep venous thromboembolism), acute, left ([place]) 02/14/2022  ? Leg edema, left 02/13/2022  ? Empyema lung ([place])   ? Goals of care, counseling/discussion 02/10/2022  ? Empyema of left pleural space ([place]) 02/10/2022  ? E-coli UTI 02/01/2022  ? SBO (small bowel obstruction) ([place]) 01/28/2022  ? Subcutaneous nodule of abdominal wall 01/09/2022  ? Partial small bowel obstruction ([place]) 01/07/2022  ? COVID-19 virus infection 01/07/2022  ? Colostomy in place [facility]) 11/10/2021  ? Stage 3a chronic kidney disease (CKD) ([place]) 11/10/2021  ? Pyelonephritis   ? Essential hypertension 10/30/2021  ? Hydronephrosis of left kidney 10/28/2021  ? Ureteral stricture, left 10/27/2021  ? Protein-calorie malnutrition, severe 06/12/2021  ? Hydroureter on right 06/10/2021  ? Aortic atherosclerosis ([place]) 06/10/2021  ? Leukocytosis 06/02/2021  ? Right ureteral stone 06/02/2021  ? Colon stricture ([place])   ? Pancreatic mass 06/27/2019  ? 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 Rhinitis medicamentosa 02/26/2019  ? ? ?Palliative Care Assessment & Plan  ? ?Patient Profile: ?  ? ?Assessment: ? 81 year old gentleman who lives at home with his wife in [place], [place].  His daughter lives nearby.  He has a past medical history of stage IV pancreatic cancer,  status post colectomy, hydronephrosis status post nephrostomy, underlying hypertension small bowel obstruction stage II chronic kidney disease.  Patient presented with ongoing abdominal pain, known to have concerns for small bowel obstruction issues.  Underwent a diagnostic laparoscopy with lysis of additions on 2-14.  He is being followed by general surgery and medical oncology.  Has been placed on TPN for nutritional support.  Has been placed on NG tube for suction.  So far, patient has not not had any bowel movements or passed flatus. ?Diet was slowly advanced NG tube removed at this time patient is tolerating diet having output in colostomy bag, having output in nephrostomy tube.  ?  ? CT scan 2/21 no intra-abdominal abscess or obstruction but shows enlarging left pleural effusion.   ?ID consulted, underwent left pleural diagnostic tap with 50 mL appears exudative fluid but Gram stain negative, total nucleated cells 25,000, neutrophils100 ?  ?2/24:placed on Zyvox and underwent small bore chest tube placement by PCCM, lytics done 2/25. ?2/26-chest tube placed to suction. ?2/27 lower leg edema noted more on left-left duplex ordered came back positive for DVT and placed on heparin drip ?Ongoing GOC discussion by palliative care-continue with current DNR and full scope of treatment. Under consideration for TEE, however, patient with worsening abdominal pain and distension with concern for SBO in the setting of metastatic cancer.  ?  ?Recommendations/Plan: ?DNR ?Continue current mode of care.   ?Call placed and discussed with wife. She is aware of the serious nature of the patient's overall condition.  ?A family meeting between PMT, [facility] MD and patient and his wife has been scheduled for 02-19-22 at 1300 to further discuss broad goals of care, next steps, disposition options and to address patient's and family's wishes.  ?PMT to follow.  ? ?Code Status: ? ?  ?Code Status Orders  ?(From admission, onward)  ?  ? ? ?  ? ?  Start     Ordered  ? 02/06/22 1335  Do not attempt resuscitation (DNR)  Continuous       ?Question Answer Comment  ?In the event of cardiac or respiratory ARREST Do not call a ?code blue?   ?In the event of cardiac or respiratory ARREST Do not perform Intubation, CPR, defibrillation or ACLS   ?In the event of cardiac or respiratory ARREST Use medication by any route, position, wound care, and other measures to relive pain and suffering. May use oxygen, suction and manual treatment of airway obstruction as needed for comfort.   ?  ? 02/06/22 1334  ? ?  ?  ? ?  ? ?Code Status History   ? ? Date Active Date Inactive Code Status Order ID Comments User Context  ? 01/29/2022 0014 02/06/2022 1334 Full Code [id number]  Marcelyn Bruins, MD Inpatient  ? 01/07/2022 2129 01/10/2022 2258 Full Code [id number]  Rise Patience, MD Inpatient  ? 12/10/2021 1331 12/11/2021 2137 Full Code [id number]  Jonnie Finner, DO ED  ? 11/10/2021 0354 11/13/2021 2039 Full Code [id number]  Chotiner, Yevonne Aline, MD Inpatient  ? 10/27/2021 2317 10/30/2021 1833 Full Code [id number]  Lenore Cordia, MD Inpatient  ? 06/09/2021 2201 06/18/2021 1923 Full Code [id number]  Lenore Cordia, MD Inpatient  ? 06/01/2021 2102 06/05/2021 1830 Full Code [id number]  Rhetta Mura, DO ED  ? 05/18/2021 1200 05/26/2021 1958 Partial Code [id number]  Jonnie Finner, DO Inpatient  ? 12/12/2014 1730 12/17/2014 1704 Full Code [id number]  Velvet Bathe, MD Inpatient  ? ?  ? ? ?Prognosis: ? Guarded  ? ?Discharge Planning: ?To Be Determined ? ?Care plan was discussed with IDT: wife on the phone, [facility] MD and surgical colleagues. ? ?Total time: 25 minutes ? ?Thank you for allowing the Palliative Medicine Team to assist in the care of this patient. ?Loistine Chance, MD ? ?Please contact Palliative Medicine Team phone at [phone] for questions and concerns.  ? ? ? ? ? ? ? ? ?

## 2022-02-18 NOTE — Progress Notes (Signed)
Full note to follow ?Patient has recurrent SBO from malignant process-I have counseled the patient that there is no curing this situation from surgical/medical perspective and that we have a new "fork in the road" to traverse-I outlined clearly palliative and hospice options and discussed the futility of further attempts at curative management ?I have briefly conferred with Dr. Marin Olp about this and he will discuss with the patient and his wife and I will also chat with her a little later today-patient states that he does not think that his wife has a firm understanding of what he is up against ?He is already a DNR-I have reconfirmed this ?I gave ministry of presence and empathy and expressed to him that although I wish things were different, I cannot change the fact that this is the card that he has been dealt ?He understands that we need to make some difficult decisions in the next 24 to 48 hours including full transition to hospice if he is agreeable with the same ?I am available to talk to his wife when she arrives-he will need some pain meds as he is uncomfortable in his stomach and nursing is aware ?It is unlikely that he will get TEE but if for some reason family is still interested in multiple curative measures we will address this with them 02/19/2022 as a family meeting probably with palliative care after Dr. Marin Olp has had the option to to connect with family and patient once again ? ?> 35 minutes palliative care counseling time ? ?Verneita Griffes, MD ?[facility] ?12:38 PM ? ?

## 2022-02-18 NOTE — Progress Notes (Signed)
Looks like the feeding tube was not put in yesterday.  He said that he did not want to have this put in.  He was very concerned about the difficulty in the discomfort that it would cause.  I told him that the feeding tube is quite a lot smaller than the NG tube that they placed whenever he has obstructions.  A lot of patients do not really even realize that they have 1 in. ? ?Looks like a calorie count is being done.  This was only be a good idea to see how many calories he is taking in.  He is more alert today.  I am happy about this part.  He says he will try to eat.  He says that if the calorie count does not show that he is taking an adequate calories, then he would agree to the feeding tube. ? ?He is on Lovenox now for the thromboembolic disease in the left leg. ? ?He has not complained of any pain.  There is no cough or shortness of breath.  I know that critical care has been following him for the chest tube.  Hopefully, this will be removed soon. ? ?He is supposed to have the TEE on Monday. ? ?His labs today show white cell count 22,000.  Hemoglobin 9.8.  Platelet count is 1 million. ? ?He does have iron deficiency.  I will give him a dose of IV iron. ? ?His vital signs are temperature 98.3.  Pulse 79.  Blood pressure 136/74.  His lungs are relatively clear bilaterally.  There may be some decrease over on the left side but he has decent air movement.  Cardiac exam regular rate and rhythm.  Abdomen is soft.  He has a laparotomy scar that is healing.  He has a ostomy site in the left lower quadrant.  Bowel sounds are present.  Extremity shows no clubbing, cyanosis or edema.  Neurological exam shows more alertness.  He is much more conversant.  He is fairly lucid. ? ?Again, I realize this is incredibly complicated.  He has a lot going on.  Again, hopefully this Enterococcus pleural effusion is resolving.  I know ID is following this. ? ?Again at the other thing comes down to his nutritional state.  We will have  to see what the calorie count shows. ? ?We will see about the iron to be given today. ? ?I do appreciate everybody's help with this. ? ? ?Lattie Haw, MD ? ?2 Thessalonians 3:5 ?

## 2022-02-18 NOTE — Progress Notes (Signed)
NUTRITION NOTE ? ?Full follow-up assessment yesterday with note at 1258. ? ?Calorie Count started yesterday. Patient ate 25% of breakfast, 20% of lunch, and 0% of dinner. The only meal ticket in the envelope was from lunch yesterday which showed that patient also drank 1/2 bottle of a vanilla Ensure. ? ?Calorie Count day #1 results: ?388 kcal and 25 grams protein ? ?Flow sheet documentation indicates that he ate 0% of breakfast this AM. ? ?Notes from today read. Will monitor for ongoing [place] and will follow-up on 3/6 with Calorie Count day #2 results, if warranted at that time. ? ?He continues to receive custom TPN at goal rate of 85 ml/hr which is meeting 100% estimated nutrition needs.  ? ? ? ?Jarome Matin, MS, RD, LDN ?Inpatient Clinical Dietitian ?RD pager # available in [place]  ?After hours/weekend pager # available in [place] ? ?

## 2022-02-18 NOTE — Plan of Care (Signed)
?  Problem: Clinical Measurements: ?Goal: Respiratory complications will improve ?Outcome: Progressing ?  ?Problem: Safety: ?Goal: Ability to remain free from injury will improve ?Outcome: Progressing ?  ?Problem: Nutrition: ?Goal: Adequate nutrition will be maintained ?Outcome: Not Progressing ?  ?

## 2022-02-18 NOTE — Progress Notes (Signed)
Patient continues to be hospitalized. Will follow for post discharge needs and follow up.  ? ?Oncology Nurse Navigator Documentation ? ?Oncology Nurse Navigator Flowsheets 02/18/2022  ?Navigator Follow Up Date: 02/25/2022  ?Navigator Follow Up Reason: Appointment Review  ?Navigator Location [facility]  ?Navigator Encounter Type Appt/Treatment Plan Review  ?Telephone -  ?Patient Visit Type MedOnc  ?Treatment Phase Active Tx  ?Barriers/Navigation Needs Coordination of Care;Education  ?Education -  ?Interventions None Required  ?Acuity Level 2-Minimal Needs (1-2 Barriers Identified)  ?Coordination of Care -  ?Education Method -  ?Support Groups/Services Friends and Family  ?Time Spent with Patient 15  ?  ?

## 2022-02-18 NOTE — Progress Notes (Signed)
?   ? ? ? ? ?[facility] for Infectious Disease ? ?Date of Admission:  01/28/2022   Total days of inpatient antibiotics 12 ? ?Principal Problem: ?  Partial small bowel obstruction ([place]) ?Active Problems: ?  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  Leukocytosis ?  Protein-calorie malnutrition, severe ?  Hydronephrosis of left kidney ?  Essential hypertension ?  Colostomy in place [facility]) ?  Stage 3a chronic kidney disease (CKD) ([place]) ?  E-coli UTI ?  Goals of care, counseling/discussion ?  Empyema of left pleural space ([facility]) ?  Empyema lung ([place]) ?  Leg DVT (deep venous thromboembolism), acute, left ([place]) ?  Pressure injury of skin ?  Thrombocytosis ?  Acute metabolic encephalopathy ?     ?    ?Assessment: ?81YM with metastatic pancreatic cancer admitted for recurrent small bowel obstruction underwent LAP on 2/14. Hospital course complicated by [place] UTI(treated with pip-tazo). Pt had increased wbc despite antibiotics with enlarging pleural effusion.  ? ?#Left sided empyema with VRE SP chest tube placement on 2/24 ?#Pancreatic Cancer with metastatic disease with psbo ?#Left leg thrombus now on heparin ?-SP thoracentesis on 2/23 with Cx+ E faecium (VRE). Cytology did not show malignant cell ?-Palliative care following ?-TTE showed thickened aortic valve. As such will order TEE. 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 As such will wait on TEE to see if antibiotic regimen regimen needs to changed.  ? ?Recommendations: ?-Continue linezolid to PO. Will likely need atleast 4 weeks of antibiotics. Additional sensitivities pending(would like to avoid linezolid past 2 weeks of treatment due to side effect profile). ?-Continue to monitor clinically ?-TEE scheduled for Monday 02/21/22 ?-Palliative  following ?Microbiology:   ?Antibiotics: ?Linezolid 2/24-p ?Pip-tazo 2/17-2/21 ?Cefazolin 2/14-2/17 ?Ceftriaxone 2/13 ?Cefotetan 2/14 ?Cultures: ?Blood ?2/17 NG ?Other ?Thoracentesis on 2/23 VRE E facium ? ?SUBJECTIVE: ?Resting bed. No new complaints.  ? ?Interval: Afebrile overnight. Wbc 21K.  ?Review of Systems: ?Review of Systems  ?All other systems reviewed and are negative. ? ? ?Scheduled Meds: ? Chlorhexidine Gluconate Cloth  6 each Topical Daily  ? docusate sodium  100 mg Oral BID  ? dronabinol  5 mg Oral BID AC  ? enoxaparin (LOVENOX) injection  1.5 mg/kg Subcutaneous Q24H  ? feeding supplement  1 Container Oral TID BM  ? feeding supplement  237 mL Oral BID BM  ? insulin aspart  0-15 Units Subcutaneous Q6H  ? lidocaine  1 patch Transdermal Q24H  ? lip balm  1 application Topical BID  ? methocarbamol  500 mg Oral QID  ? OLANZapine  2.5 mg Oral QHS  ? sodium chloride flush  10 mL Other Q8H  ? sodium chloride flush  3 mL Intravenous Q12H  ? ?Continuous Infusions: ? sodium chloride Stopped (01/28/22 2301)  ? [START ON 02/20/2022] ferumoxytol    ? linezolid (ZYVOX) IV 600 mg (02/18/22 1046)  ? TPN ADULT (ION) 85 mL/hr at 02/17/22 1724  ? TPN ADULT (ION)    ? ?PRN Meds:.sodium chloride, acetaminophen, alum & mag hydroxide-simeth, antiseptic oral rinse, diphenhydrAMINE **OR** diphenhydrAMINE, fentaNYL (SUBLIMAZE) injection, iohexol, menthol-cetylpyridinium, ondansetron (ZOFRAN) IV, oxyCODONE, phenol, prochlorperazine, simethicone ?Allergies  ?Allergen Reactions  ? Pollen Extract Other (See Comments)  ?  Sinus congestion  ? ? ?OBJECTIVE: ?Vitals:  ? 02/18/22 0402 02/18/22 0500 02/18/22 1200 02/18/22 1240  ?BP: 136/74  130/85   ?Pulse: 79  83   ?Resp: 18  (!) 22 20  ?Temp: 98.3 ?F (36.8 ?C)  97.8 ?F (36.6 ?C)   ?TempSrc: Oral  Oral   ?SpO2: 92%  94%   ?Weight:  70 kg    ?Height:      ? ?Body mass index is 21.52 kg/m?. ? ?Physical Exam ?Constitutional:   ?   General: He is not in acute distress. ?   Appearance: He is  normal weight. He is not toxic-appearing.  ?HENT:  ?   Head: Normocephalic and atraumatic.  ?   Right Ear: External ear normal.  ?   Left Ear: External ear normal.  ?   Nose: No congestion or rhinorrhea.  ?   Mouth/Throat:  ?   Mouth: Mucous membranes are moist.  ?   Pharynx: Oropharynx is clear.  ?Eyes:  ?   Extraocular Movements: Extraocular movements intact.  ?   Conjunctiva/sclera: Conjunctivae normal.  ?   Pupils: Pupils are equal, round, and reactive to light.  ?Cardiovascular:  ?   Rate and Rhythm: Normal rate and regular rhythm.  ?   Heart sounds: No murmur heard. ?  No friction rub. No gallop.  ?Pulmonary:  ?   Effort: Pulmonary effort is normal.  ?   Breath sounds: Normal breath sounds.  ?Abdominal:  ?   General: Abdomen is flat. Bowel sounds are normal.  ?   Palpations: Abdomen is soft.  ?Musculoskeletal:     ?   General: No swelling. Normal range of motion.  ?   Cervical back: Normal range of motion and neck supple.  ?Skin: ?   General: Skin is warm and dry.  ?Neurological:  ?   General: No focal deficit present.  ?   Mental Status: He is oriented to person, place, and time.  ?Psychiatric:     ?   Mood and Affect: Mood normal.  ? ? ? ? ?Lab Results ?Lab Results  ?Component Value Date  ? WBC 22.0 (H) 02/18/2022  ? HGB 9.8 (L) 02/18/2022  ? HCT 31.7 (L) 02/18/2022  ? MCV 98.4 02/18/2022  ? PLT 1,003 ([place]) 02/18/2022  ?  ?Lab Results  ?Component Value Date  ? CREATININE 1.26 (H) 02/17/2022  ? BUN 46 (H) 02/17/2022  ? NA 137 02/17/2022  ? K 4.0 02/17/2022  ? CL 103 02/17/2022  ? CO2 28 02/17/2022  ?  ?Lab Results  ?Component Value Date  ? ALT 32 02/17/2022  ? AST 35 02/17/2022  ? ALKPHOS 177 (H) 02/17/2022  ? BILITOT 0.3 02/17/2022  ?  ? ? ? ? ?Laurice Record, MD ?[facility] ?[facility] ?02/18/2022, 4:55 PM  ?

## 2022-02-18 NOTE — Progress Notes (Signed)
Patient ID: Kevin Villegas, male   DOB: 02-04-1941, 81 y.o.   MRN: [id number] ?[facility] ?Progress Note ? ?17 Days Post-Op  ?Subjective: ?Difficult to gauge how he is doing.  When asked he states " I don't know.  You tell me".  He did ask for ibuprofen for some back pain while I was in there. Unsure when his colostomy bag was changed last and if he is having any output.  He states he CAN eat, but only if he wants to, which he does not.  Denies nausea. ? ?Objective: ?Vital signs in last 24 hours: ?Temp:  [98.3 ?F (36.8 ?C)-98.6 ?F (37 ?C)] 98.3 ?F (36.8 ?C) (03/03 0402) ?Pulse Rate:  [79-95] 79 (03/03 0402) ?Resp:  [16-20] 18 (03/03 0402) ?BP: (102-136)/(67-74) 136/74 (03/03 0402) ?SpO2:  [91 %-97 %] 92 % (03/03 0402) ?Weight:  [70 kg] 70 kg (03/03 0500) ?Last BM Date : 02/16/22 ? ?Intake/Output from previous day: ?03/02 0701 - 03/03 0700 ?In: 3011.8 [P.O.:280; I.V.:2014.8; IV WEXHBZJIR:678] ?Out: 1700 [Urine:1700] ?Intake/Output this shift: ?Total I/O ?In: -  ?Out: 225 [Urine:225] ? ?PE: ?Gen:  Alert, NAD ?Abd: distended and tympanitic today.  No output in his colostomy, stool or air.  Wound is overall clean and dressing changed.  Peritoneal nodule present on right side of abdomen ? ?Lab Results:  ?Recent Labs  ?  02/17/22 ?0429 02/18/22 ?9381  ?WBC 23.4* 22.0*  ?HGB 9.5* 9.8*  ?HCT 31.3* 31.7*  ?PLT 1,020* 1,003*  ? ?BMET ?Recent Labs  ?  02/16/22 ?0358 02/17/22 ?0429  ?NA 140 137  ?K 4.1 4.0  ?CL 107 103  ?CO2 27 28  ?GLUCOSE 132* 119*  ?BUN 46* 46*  ?CREATININE 1.02 1.26*  ?CALCIUM 7.8* 7.8*  ? ?PT/INR ?No results for input(s): LABPROT, INR in the last 72 hours. ?CMP  ?   ?Component Value Date/Time  ? NA 137 02/17/2022 0429  ? NA 139 12/31/2021 1127  ? K 4.0 02/17/2022 0429  ? CL 103 02/17/2022 0429  ? CO2 28 02/17/2022 0429  ? GLUCOSE 119 (H) 02/17/2022 0429  ? BUN 46 (H) 02/17/2022 0429  ? BUN 38 (H) 12/31/2021 1127  ? CREATININE 1.26 (H) 02/17/2022 0429  ? CREATININE 1.58 (H) 11/18/2021 1149  ?  CALCIUM 7.8 (L) 02/17/2022 0429  ? PROT 5.6 (L) 02/17/2022 0429  ? PROT 7.2 12/31/2021 1127  ? ALBUMIN 1.5 (L) 02/17/2022 0429  ? ALBUMIN 3.9 12/31/2021 1127  ? AST 35 02/17/2022 0429  ? AST 44 (H) 11/18/2021 1149  ? ALT 32 02/17/2022 0429  ? ALT 45 (H) 11/18/2021 1149  ? ALKPHOS 177 (H) 02/17/2022 0429  ? BILITOT 0.3 02/17/2022 0429  ? BILITOT 0.3 12/31/2021 1127  ? BILITOT 0.3 11/18/2021 1149  ? GFRNONAA 58 (L) 02/17/2022 0429  ? GFRNONAA 44 (L) 11/18/2021 1149  ? GFRAA >60 11/24/2019 1600  ? ?Lipase  ?   ?Component Value Date/Time  ? LIPASE 42 01/28/2022 1500  ? ? ? ? ? ?Studies/Results: ?DG CHEST PORT 1 VIEW ? ?Result Date: 02/18/2022 ?CLINICAL DATA:  Shortness of breath. EXAM: PORTABLE CHEST 1 VIEW COMPARISON:  One-view chest x-ray 02/16/2022. FINDINGS: Left lower pneumothorax is stable to minimally increased in size following removal of the chest tube. Airspace densities along the margin of the pneumothorax stable. Interstitial edema in the right lung is slightly increased. Right IJ Port-A-Cath is stable. IMPRESSION: 1. Stable to minimally increased size of left lower pneumothorax following removal of chest tube. 2. Slight increase in interstitial  edema in the right lung. Electronically Signed   By: San Morelle M.D.   On: 02/18/2022 08:35   ? ?Anti-infectives: ?Anti-infectives (From admission, onward)  ? ? Start     Dose/Rate Route Frequency Ordered Stop  ? 02/16/22 1200  linezolid (ZYVOX) IVPB 600 mg       ? 600 mg ?300 mL/hr over 60 Minutes Intravenous Every 12 hours 02/16/22 1115    ? 02/14/22 1015  linezolid (ZYVOX) tablet 600 mg  Status:  Discontinued       ? 600 mg Oral Every 12 hours 02/14/22 0929 02/16/22 1115  ? 02/11/22 1330  linezolid (ZYVOX) IVPB 600 mg  Status:  Discontinued       ? 600 mg ?300 mL/hr over 60 Minutes Intravenous Every 12 hours 02/11/22 1234 02/14/22 0929  ? 02/04/22 1545  piperacillin-tazobactam (ZOSYN) IVPB 3.375 g       ? 3.375 g ?12.5 mL/hr over 240 Minutes Intravenous  Every 8 hours 02/04/22 1453 02/09/22 1359  ? 02/01/22 2200  ceFAZolin (ANCEF) IVPB 1 g/50 mL premix  Status:  Discontinued       ? 1 g ?100 mL/hr over 30 Minutes Intravenous Every 8 hours 02/01/22 1504 02/04/22 1453  ? 02/01/22 0930  cefoTEtan (CEFOTAN) 2 g in sodium chloride 0.9 % 100 mL IVPB       ? 2 g ?200 mL/hr over 30 Minutes Intravenous On call to O.R. 01/31/22 1908 02/01/22 1533  ? 01/30/22 1000  cefTRIAXone (ROCEPHIN) 1 g in sodium chloride 0.9 % 100 mL IVPB  Status:  Discontinued       ? 1 g ?200 mL/hr over 30 Minutes Intravenous Every 24 hours 01/29/22 0013 02/01/22 1504  ? 01/28/22 1830  cefTRIAXone (ROCEPHIN) 1 g in sodium chloride 0.9 % 100 mL IVPB       ? 1 g ?200 mL/hr over 30 Minutes Intravenous  Once 01/28/22 1828 01/28/22 1927  ? ?  ? ? ? ?Assessment/Plan ?Stage IV pancreatic cancer with psbo  ?- POD#17 s/p Lap LOA, Small bowel resection, excisional biopsy of mesentery mass by Dr. 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 Decrease in left pleural effusion after chest tube placement ?- on soft diet, but not taking in anything.  More distention today, will order a plain film to further evaluate.  ? Ileus vs could he have another malignant obstruction.  Await film, but if not very conclusive may need a scan to further evaluate.  If he has another malignant obstruction, or obstruction in general, he really has no chance at recovery. ?- Oncology has ordered Cortrak/ tube feedings for this patient, but he states he does not want this. ?- Unfortunately the patient is not progressing well. He has had multiple complications and does not look well. He has metastatic cancer. No option for further surgery at this point. Palliative team is following,  appreciate their assistance.  ?  ?FEN: TPN, soft diet ?ID: cefotetan periop, ancef (UTI), zosyn 2/17>2/21. Linezolid 2/24>> ?VTE: heparin gtt ?  ?Left leg DVT on heparin ?Left empyema s/p chest tube ?E coli UTI - completed abx ?CKD 3A ?HTN ?Code status DNR - palliative following ?Severe protein calorie malnutrition ? ? ? LOS: 20 days  ? ? ?Henreitta Cea, PA-C ?[facility] ?02/18/2022, 10:30 AM ?Please see Amion for pager number during day hours 7:00am-4:30pm ? ?

## 2022-02-18 NOTE — Progress Notes (Signed)
PROGRESS NOTE Kevin Villegas  SWF:[id number] DOB: 1941-12-17 DOA: 01/28/2022 PCP: Dettinger, [name], MD   Brief Narrative/Hospital Course: 68 m stage IV pancreatic cancer status post colectomy,  hydronephrosis status post left nephrostomy 10/2021 [last change 01/17/22] hypertension,CKD stage III   P small bowel obstruction on admit 1/20 -->1/23 Re-admit 2/11 ,  2/14 status post diagnostic laparoscopy with lysis of adhesion  complication coffee-ground colored secretion from NG tube, leukocytosis. -Repeat CT scan 2/20 no bowel obstruction, no abscess moderate amount of free fluid in the right upper quadrant, body wall edema noted to the right anterior wall. With persistent leukocytosis but afebrile, being managed with TPN, IV antibiotics-.   2/17-2/22.  completed IV Zosyn  2/21 CTchest no intra-abdominal abscess or obstruction -enlarging left pleural effusion.   ID consulted, underwent left pleural diagnostic tap with 50 mL appears exudative fluid but Gram stain negative, total nucleated cells 25,000, neutrophils100 2/24:placed on Zyvox and underwent small bore chest tube placement by PCCM, lytics done 2/25. 2/26-chest tube placed to suction. 2/27 lower leg edema noted more on left-left duplex ordered came back positive for DVT and placed on heparin drip 3/2 Chest tube out  3/3 imaging suggestive of SBO which is likely malignancy related-we conferred with palliative care-goals of care discussion planned  Subjective:  Has malignant SBO with poor options he is not hungry He has no pain no fever He seems to understand the serious nature of this new implication and understands he is not a candidate for NG tube placement now  Assessment and Plan:  * Partial small bowel obstruction ([facility] [facility])- (present on admission) Stage IV pancreatic cancer with partial SBO s/p  Surgery CT 2/25 with small abscess in the abdominal wall Persistent abdominal pain distention nausea vomiting: S/p LOA, SBR,biopsy  2/14- positive for ACC with mucinous features of left colon mesentery and jejunum. Off NG tube 2/21, S/P Zosyn  2/17>>2/22. Colostomy has no output We will discuss comfort measures in a.m.  Empyema of left pleural space (HCC) S/P thick purulent drainage- 50 ml by IR 2/23- grew vanc resistant Enterococcus, likely seeding previously known loculated left effusion S/p small bore chest tube placement 2/24 Dr Elsworth Soho + lytics 2/25-tube out 3/2 on Zyvox 2/24 change to PO 2/27> plan to switch to Doxy after 2 weeks-needs at least 4 weeks of antibiotics -- TEE scheduled 02/21/22-depending on goals of care discussion tomorrow we will probably cancel this Fluid cytology is negative.   Acute metabolic encephalopathy intermittent confusion,--he is very clear today and communicative.    Thrombocytosis likely reactive.  See above  Leg DVT (deep venous thromboembolism), acute, left (HCC) Duplex of the left leg + for acute DVT in left mid and distal femoral vein, posterior tibial vein and peroneal vein.  This in the setting of his metastatic cancer, postop status immobility despite being on Lovenox prophylaxis. Started heparin 2/27 Consider discontinuation of transition to Lovenox  E-coli UTI, hydronephrosis which is chronic since 10/2021 Completed antibiotics  renal ultrasound on 2/17  -chronic right hydronephrosis with atrophic right kidney, urology advised outpatient follow-up.  Nephrostomy draining well  Stage 3a chronic kidney disease (CKD) ([place])- (present on admission) Check labs in a.m.  Essential hypertension- (present on admission) Controlled, not on meds.  And present on admission  Protein-calorie malnutrition, severe- (present on admission)  TPN only at this time-keep n.p.o.  Leukocytosis Thrombocytosis: Peaked to 38k-trended down to 18 K but now uptrending despite being on Zyvox  Malignant neoplasm of tail of pancreas (ypT3ypN1)M1  with metastatic  disease- (present on admission) See  #1.Oncology on board.  Dr. Marin Olp is aware about goals of this care discussion that we will have tomorrow  Sacral decub stage 3 Turn q 2   DVT prophylaxis: SCDs Start: 01/29/22 0013. On heparin gtt since 2/27 Code Status:   Code Status: DNR Family Communication: Discussed with family on goals of care discussion a.m.   Disposition:Currently not medically stable for discharge. Status [id number] Remains Inpatient appropriate because: ongoing management of SBO/empyema   Objective: Vitals last 24 hrs: Vitals:   02/18/22 0402 02/18/22 0500 02/18/22 1200 02/18/22 1240  BP: 136/74  130/85   Pulse: 79  83   Resp: 18  (!) 22 20  Temp: 98.3 F (36.8 C)  97.8 F (36.6 C)   TempSrc: Oral  Oral   SpO2: 92%  94%   Weight:  70 kg    Height:       Weight change: -0.6 kg  Physical Examination:  Awake Flat affect Ostomy in place with no stool-did not examine nephrostomy Abdomen soft no rebound no guarding No lower extremity edema On TPN  Medications reviewed:  Scheduled Meds:  Chlorhexidine Gluconate Cloth  6 each Topical Daily   docusate sodium  100 mg Oral BID   dronabinol  5 mg Oral BID AC   enoxaparin (LOVENOX) injection  1.5 mg/kg Subcutaneous Q24H   feeding supplement  1 Container Oral TID BM   feeding supplement  237 mL Oral BID BM   insulin aspart  0-15 Units Subcutaneous Q6H   lidocaine  1 patch Transdermal Q24H   lip balm  1 application Topical BID   methocarbamol  500 mg Oral QID   OLANZapine  2.5 mg Oral QHS   sodium chloride flush  10 mL Other Q8H   sodium chloride flush  3 mL Intravenous Q12H   Continuous Infusions:  sodium chloride Stopped (01/28/22 2301)   [START ON 02/20/2022] ferumoxytol     linezolid (ZYVOX) IV 600 mg (02/18/22 1046)   TPN ADULT (ION) 85 mL/hr at 02/18/22 1739    Pressure Injury 02/13/22 Coccyx Posterior Stage 3 -  Full thickness tissue loss. Subcutaneous fat may be visible but bone, tendon or muscle are NOT exposed. pink red shallow  open area with yellow center (Active)  02/13/22 (documented for [name] RN) [id number]  Location: Coccyx  Location Orientation: Posterior  Staging: Stage 3 -  Full thickness tissue loss. Subcutaneous fat may be visible but bone, tendon or muscle are NOT exposed.  Wound Description (Comments): pink red shallow open area with yellow center  Present on Admission: Yes (per [name] RN present on assessment)   Diet Order             DIET SOFT Room service appropriate? Yes; Fluid consistency: Thin  Diet effective now                     Data Reviewed: I have personally reviewed following labs and imaging studies CBC: Recent Labs  Lab 02/14/22 0713 02/15/22 0357 02/16/22 0358 02/17/22 0429 02/18/22 0358  WBC 18.4* 20.2* 21.0* 23.4* 22.0*  NEUTROABS 15.1* 15.3* 15.7* 17.6* 16.6*  HGB 10.0* 10.3* 9.3* 9.5* 9.8*  HCT 32.3* 33.2* 29.7* 31.3* 31.7*  MCV 97.0 96.0 96.1 98.4 98.4  PLT 1,049* 1,066* 1,053* 1,020* 1,003*    Basic Metabolic Panel: Recent Labs  Lab 02/12/22 0551 02/13/22 0623 02/14/22 0713 02/15/22 0357 02/16/22 0358 02/17/22 0429  NA 141 142 139 141 140 137  K 3.8 4.5 4.0 4.3 4.1 4.0  CL 114* 109 109 107 107 103  CO2 23 26 26 28 27 28   GLUCOSE 177* 113* 160* 150* 132* 119*  BUN 55* 52* 46* 48* 46* 46*  CREATININE 1.19 1.24 1.15 1.25* 1.02 1.26*  CALCIUM 8.2* 8.5* 7.9* 8.0* 7.8* 7.8*  MG 2.2  --  2.0 2.1  --  2.1  PHOS  --   --  3.6 3.5  --  3.6    GFR: Estimated Creatinine Clearance: 46.3 mL/min (A) (by C-G formula based on SCr of 1.26 mg/dL (H)). Liver Function Tests: Recent Labs  Lab 02/13/22 0623 02/14/22 0713 02/15/22 0357 02/16/22 0358 02/17/22 0429  AST 83* 70* 60* 45* 35  ALT 65* 60* 53* 40 32  ALKPHOS 224* 210* 228* 191* 177*  BILITOT 0.3 0.3 0.5 0.1* 0.3  PROT 5.7* 5.3* 5.7* 5.5* 5.6*  ALBUMIN <1.5* <1.5* 1.6* <1.5* 1.5*    No results for input(s): LIPASE, AMYLASE in the last 168 hours. No results for input(s): AMMONIA in the  last 168 hours. Coagulation Profile: Recent Labs  Lab 02/14/22 0950  INR 1.2    Cardiac Enzymes:  CBG: Recent Labs  Lab 02/17/22 1201 02/17/22 1652 02/17/22 2352 02/18/22 0357 02/18/22 1227  GLUCAP 121* 97 134* 108* 120*        Recent Results (from the past 240 hour(s))  Body fluid culture w Gram Stain     Status: None (Preliminary result)   Collection Time: 02/10/22  1:37 PM   Specimen: PATH Cytology Pleural fluid  Result Value Ref Range Status   Specimen Description   Final    PLEURAL Performed at [facility] [address], [place], [place] [address]    Special Requests   Final    NONE Performed at [facility], [place] [address] [place], [place] [address]    Gram Stain FEW WBC SEEN NO ORGANISMS SEEN   Final   Culture   Final    MODERATE ENTEROCOCCUS FAECIUM VANCOMYCIN RESISTANT ENTEROCOCCUS CRITICAL RESULT CALLED TO, READ BACK BY AND VERIFIED WITH: RN LISA.P AT 1144 ON 02/11/2022 BY T.SAAD. Sent to [place] for further susceptibility testing. Performed at [facility], [place] [address] [place], [place] [address]    Report Status PENDING  Incomplete   Organism ID, Bacteria ENTEROCOCCUS FAECIUM  Final      Susceptibility   Enterococcus faecium - MIC*    AMPICILLIN >=32 RESISTANT Resistant     VANCOMYCIN >=32 RESISTANT Resistant     GENTAMICIN SYNERGY SENSITIVE Sensitive     LINEZOLID 2 SENSITIVE Sensitive     * MODERATE ENTEROCOCCUS FAECIUM     Antimicrobials:     Component Value Date/Time   SDES  02/10/2022 1337    PLEURAL Performed at [facility], [place] [address], [place], [place] [address]    SPECREQUEST  02/10/2022 1337    NONE Performed at [facility], [place] [address] [place], [place] [address]    CULT  02/10/2022 1337    MODERATE ENTEROCOCCUS FAECIUM VANCOMYCIN RESISTANT ENTEROCOCCUS CRITICAL RESULT CALLED TO, READ BACK BY AND VERIFIED WITH: RN LISA.P AT  1144 ON 02/11/2022 BY T.SAAD. Sent to [place] for further susceptibility testing. Performed at [facility], [place] [address], [place], [place] [address]    REPTSTATUS PENDING 02/10/2022 1337  Radiology Studies: DG CHEST PORT 1 VIEW  Result Date: 02/18/2022 CLINICAL DATA:  Shortness of breath. EXAM: PORTABLE CHEST  1 VIEW COMPARISON:  One-view chest x-ray 02/16/2022. FINDINGS: Left lower pneumothorax is stable to minimally increased in size following removal of the chest tube. Airspace densities along the margin of the pneumothorax stable. Interstitial edema in the right lung is slightly increased. Right IJ Port-A-Cath is stable. IMPRESSION: 1. Stable to minimally increased size of left lower pneumothorax following removal of chest tube. 2. Slight increase in interstitial edema in the right lung. Electronically Signed   By: San Morelle M.D.   On: 02/18/2022 08:35   DG Abd Portable 1V  Result Date: 02/18/2022 CLINICAL DATA:  Abdominal distension. EXAM: PORTABLE ABDOMEN - 1 VIEW COMPARISON:  Abdominal CT 02/12/2022. FINDINGS: Distal duodenal stent is noted with persistent moderate small bowel dilatation in the mid and right abdomen. There is possible fecalization of the distal small bowel. The colon appears decompressed. Scattered bowel anastomosis clips, cholecystectomy clips, percutaneous left nephrostomy tube and left ureteral stent remain in place. There are mild degenerative changes within the spine. IMPRESSION: Persistent moderate diffuse small bowel dilatation following distal duodenal stenting, similar to recent prior studies, and improved from 01/28/2022. Findings may reflect persistent partial small-bowel obstruction versus ileus. Electronically Signed   By: Richardean Sale M.D.   On: 02/18/2022 11:42     LOS: 20 days   Nita Sells, MD [facility]  02/18/2022, 6:05 PM

## 2022-02-19 ENCOUNTER — Other Ambulatory Visit (HOSPITAL_COMMUNITY): Payer: Medicare Other

## 2022-02-19 LAB — GLUCOSE, CAPILLARY
Glucose-Capillary: 103 mg/dL — ABNORMAL HIGH (ref 70–99)
Glucose-Capillary: 108 mg/dL — ABNORMAL HIGH (ref 70–99)
Glucose-Capillary: 115 mg/dL — ABNORMAL HIGH (ref 70–99)
Glucose-Capillary: 71 mg/dL (ref 70–99)

## 2022-02-19 LAB — MINIMUM INHIBITORY CONC. (1 DRUG)

## 2022-02-19 LAB — MIC RESULT

## 2022-02-19 LAB — BASIC METABOLIC PANEL
Anion gap: 7 (ref 5–15)
BUN: 46 mg/dL — ABNORMAL HIGH (ref 8–23)
CO2: 26 mmol/L (ref 22–32)
Calcium: 7.9 mg/dL — ABNORMAL LOW (ref 8.9–10.3)
Chloride: 99 mmol/L (ref 98–111)
Creatinine, Ser: 1.2 mg/dL (ref 0.61–1.24)
GFR, Estimated: >60 mL/min (ref >60)
Glucose, Bld: 130 mg/dL — ABNORMAL HIGH (ref 70–99)
Potassium: 4 mmol/L (ref 3.5–5.1)
Sodium: 132 mmol/L — ABNORMAL LOW (ref 135–145)

## 2022-02-19 LAB — PHOSPHORUS: Phosphorus: 3.5 mg/dL (ref 2.5–4.6)

## 2022-02-19 LAB — MAGNESIUM: Magnesium: 2 mg/dL (ref 1.7–2.4)

## 2022-02-19 MED ORDER — HALOPERIDOL LACTATE 5 MG/ML IJ SOLN
0.5000 mg | INTRAMUSCULAR | Status: DC | PRN
  Administered 2022-02-20: 0.5 mg via INTRAVENOUS
  Filled 2022-02-19: qty 1

## 2022-02-19 MED ORDER — HALOPERIDOL 0.5 MG PO TABS
0.5000 mg | ORAL_TABLET | ORAL | Status: DC | PRN
Start: 2022-02-19 — End: 2022-02-24
  Filled 2022-02-19: qty 1

## 2022-02-19 MED ORDER — BIOTENE DRY MOUTH MT LIQD: 15.0000 mL | OROMUCOSAL | Status: DC | PRN

## 2022-02-19 MED ORDER — MORPHINE SULFATE (CONCENTRATE) 10 MG/0.5ML PO SOLN
5.0000 mg | ORAL | Status: DC | PRN
  Administered 2022-02-23: 5 mg via SUBLINGUAL
  Filled 2022-02-19: qty 0.5

## 2022-02-19 MED ORDER — ORAL CARE MOUTH RINSE
15.0000 mL | Freq: Two times a day (BID) | OROMUCOSAL | Status: DC
  Administered 2022-02-19 – 2022-02-24 (×6): 15 mL via OROMUCOSAL

## 2022-02-19 MED ORDER — HALOPERIDOL LACTATE 2 MG/ML PO CONC
0.5000 mg | ORAL | Status: DC | PRN
  Filled 2022-02-19: qty 0.3

## 2022-02-19 MED ORDER — MORPHINE SULFATE (CONCENTRATE) 10 MG/0.5ML PO SOLN
5.0000 mg | ORAL | Status: DC | PRN
  Administered 2022-02-21 – 2022-02-23 (×3): 5 mg via ORAL
  Filled 2022-02-19 (×3): qty 0.5

## 2022-02-19 MED ORDER — TRAVASOL 10 % IV SOLN
INTRAVENOUS | Status: DC
  Filled 2022-02-19: qty 1122

## 2022-02-19 MED ORDER — POLYVINYL ALCOHOL 1.4 % OP SOLN: 1.0000 [drp] | Freq: Four times a day (QID) | OPHTHALMIC | Status: DC | PRN

## 2022-02-19 MED ORDER — TRAZODONE HCL 50 MG PO TABS
25.0000 mg | ORAL_TABLET | Freq: Every evening | ORAL | Status: DC | PRN
  Administered 2022-02-22 – 2022-02-23 (×2): 25 mg via ORAL
  Filled 2022-02-19 (×2): qty 1

## 2022-02-19 MED ORDER — ATROPINE SULFATE 1 % OP SOLN: 4.0000 [drp] | OPHTHALMIC | Status: DC | PRN

## 2022-02-19 NOTE — Progress Notes (Signed)
? ?                                                                                                                                                     ?                                                   ?Daily Progress Note  ? ?Patient Name: Kevin Villegas       Date: 02/19/2022 ?DOB: [birth year]  Age: 81 y.o. MRN#: [id number] ?Attending Physician: Nita Sells, MD ?Primary Care Physician: Dettinger, [name], MD ?Admit Date: 01/28/2022 ? ?Reason for Consultation/Follow-up: Establishing goals of care ? ?Subjective: ?Resting in bed, abdominal pain ?  ? ?Length of Stay: 21 ? ?Current Medications: ?Scheduled Meds:  ? Chlorhexidine Gluconate Cloth  6 each Topical Daily  ? docusate sodium  100 mg Oral BID  ? dronabinol  5 mg Oral BID AC  ? enoxaparin (LOVENOX) injection  1.5 mg/kg Subcutaneous Q24H  ? feeding supplement  1 Container Oral TID BM  ? feeding supplement  237 mL Oral BID BM  ? insulin aspart  0-15 Units Subcutaneous Q6H  ? lidocaine  1 patch Transdermal Q24H  ? lip balm  1 application Topical BID  ? mouth rinse  15 mL Mouth Rinse BID  ? methocarbamol  500 mg Oral QID  ? OLANZapine  2.5 mg Oral QHS  ? sodium chloride flush  10 mL Other Q8H  ? sodium chloride flush  3 mL Intravenous Q12H  ? ? ?Continuous Infusions: ? sodium chloride Stopped (01/28/22 2301)  ? [START ON 02/20/2022] ferumoxytol    ? linezolid (ZYVOX) IV 600 mg (02/19/22 1134)  ? TPN ADULT (ION) 85 mL/hr at 02/18/22 1739  ? TPN ADULT (ION)    ? ? ?PRN Meds: ?sodium chloride, acetaminophen, alum & mag hydroxide-simeth, antiseptic oral rinse, diphenhydrAMINE **OR** diphenhydrAMINE, fentaNYL (SUBLIMAZE) injection, iohexol, menthol-cetylpyridinium, ondansetron (ZOFRAN) IV, oxyCODONE, phenol, prochlorperazine, simethicone ? ?         ?Appears chronically ill  ?Resting in bed ?No edema ?Awake alert ?S 1 S 2  ?Tender over lower abdomen mild to moderate.  ? ?Vital Signs: BP 134/76 (BP Location: Left Arm)   Pulse 80   Temp 98.4 ?F (36.9 ?C) (Oral)    Resp (!) 22   Ht '5\' 11"'$  (1.803 m)   Wt 70.6 kg   SpO2 94%   BMI 21.71 kg/m?  ?SpO2: SpO2: 94 % ?O2 Device: O2 Device: Nasal Cannula ?O2 Flow Rate: O2 Flow Rate (L/min): 2 L/min ? ?Intake/output summary:  ?Intake/Output Summary (Last 24 hours) at 02/19/2022 1322 ?Last data filed at 02/19/2022 1008 ?Gross per 24 hour  ?Intake 1589.76  ml  ?Output 1825 ml  ?Net -235.24 ml  ? ?LBM: Last BM Date : 02/18/22 ?Baseline Weight: Weight: 57 kg ?Most recent weight: Weight: 70.6 kg ? ?     ?Palliative Assessment/Data: ? ? ? ? ? ?Patient Active Problem List  ? Diagnosis Date Noted  ? Pressure injury of skin 02/15/2022  ? Thrombocytosis 02/15/2022  ? Acute metabolic encephalopathy 83/38/2505  ? Leg DVT (deep venous thromboembolism), acute, left ([place]) 02/14/2022  ? Leg edema, left 02/13/2022  ? Empyema lung ([place])   ? Goals of care, counseling/discussion 02/10/2022  ? Empyema of left pleural space ([place]) 02/10/2022  ? E-coli UTI 02/01/2022  ? SBO (small bowel obstruction) ([place]) 01/28/2022  ? Subcutaneous nodule of abdominal wall 01/09/2022  ? Partial small bowel obstruction ([place]) 01/07/2022  ? COVID-19 virus infection 01/07/2022  ? Colostomy in place [facility]) 11/10/2021  ? Stage 3a chronic kidney disease (CKD) ([place]) 11/10/2021  ? Pyelonephritis   ? Essential hypertension 10/30/2021  ? Hydronephrosis of left kidney 10/28/2021  ? Ureteral stricture, left 10/27/2021  ? Protein-calorie malnutrition, severe 06/12/2021  ? Hydroureter on right 06/10/2021  ? Aortic atherosclerosis ([place]) 06/10/2021  ? Leukocytosis 06/02/2021  ? Right ureteral stone 06/02/2021  ? Colon stricture ([place])   ? Pancreatic mass 06/27/2019  ? 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 Rhinitis medicamentosa 02/26/2019  ? ? ?Palliative Care Assessment & Plan  ? ?Patient Profile: ?  ? ?Assessment: ? 81 year old gentleman who lives at home with his wife in [place], [place].  His daughter lives nearby.  He has a past  medical history of stage IV pancreatic cancer, status post colectomy, hydronephrosis status post nephrostomy, underlying hypertension small bowel obstruction stage II chronic kidney disease.  Patient presented with ongoing abdominal pain, known to have concerns for small bowel obstruction issues.  Underwent a diagnostic laparoscopy with lysis of additions on 2-14.  He is being followed by general surgery and medical oncology.  Has been placed on TPN for nutritional support.  Has been placed on NG tube for suction.  So far, patient has not not had any bowel movements or passed flatus. ?Diet was slowly advanced NG tube removed at this time patient is tolerating diet having output in colostomy bag, having output in nephrostomy tube.  ?  ? CT scan 2/21 no intra-abdominal abscess or obstruction but shows enlarging left pleural effusion.   ?ID consulted, underwent left pleural diagnostic tap with 50 mL appears exudative fluid but Gram stain negative, total nucleated cells 25,000, neutrophils100 ?  ?2/24:placed on Zyvox and underwent small bore chest tube placement by PCCM, lytics done 2/25. ?2/26-chest tube placed to suction. ?2/27 lower leg edema noted more on left-left duplex ordered came back positive for DVT and placed on heparin drip ?Ongoing GOC discussion by palliative care-continue with current DNR and full scope of treatment. Under consideration for TEE, however, patient with worsening abdominal pain and distension with concern for SBO in the setting of metastatic cancer.  ?  ?Recommendations/Plan: ?DNR ?A family meeting was held: patient wife daughter and Dr Verlon Au attended and participated. We reviewed the patient's current condition. We brought up comfort care and residential hospice.  ?Plan: ?Transfer to residential hospice, choices discussed, [facility] [place].  ? ?Code Status: ? ?  ?Code Status Orders  ?(From admission, onward)  ?  ? ? ?  ? ?  Start     Ordered  ? 02/06/22  1335  Do not attempt resuscitation (DNR)  Continuous       ?Question Answer Comment  ?In the event of cardiac or respiratory ARREST Do not call a ?code blue?   ?In the event of cardiac or respiratory ARREST Do not perform Intubation, CPR, defibrillation or ACLS   ?In the event of cardiac or respiratory ARREST Use medication by any route, position, wound care, and other measures to relive pain and suffering. May use oxygen, suction and manual treatment of airway obstruction as needed for comfort.   ?  ? 02/06/22 1334  ? ?  ?  ? ?  ? ?Code Status History   ? ? Date Active Date Inactive Code Status Order ID Comments User Context  ? 01/29/2022 0014 02/06/2022 1334 Full Code [id number]  Marcelyn Bruins, MD Inpatient  ? 01/07/2022 2129 01/10/2022 2258 Full Code [id number]  Rise Patience, MD Inpatient  ? 12/10/2021 1331 12/11/2021 2137 Full Code [id number]  Jonnie Finner, DO ED  ? 11/10/2021 0354 11/13/2021 2039 Full Code [id number]  Chotiner, Yevonne Aline, MD Inpatient  ? 10/27/2021 2317 10/30/2021 1833 Full Code [id number]  Lenore Cordia, MD Inpatient  ? 06/09/2021 2201 06/18/2021 1923 Full Code [id number]  Lenore Cordia, MD Inpatient  ? 06/01/2021 2102 06/05/2021 1830 Full Code [id number]  Rhetta Mura, DO ED  ? 05/18/2021 1200 05/26/2021 1958 Partial Code [id number]  Jonnie Finner, DO Inpatient  ? 12/12/2014 1730 12/17/2014 1704 Full Code [id number]  Velvet Bathe, MD Inpatient  ? ?  ? ? ?Prognosis: ?Less than 2 weeks.  ? ?Discharge Planning: ?Residential hospice.  ? ?Care plan was discussed with IDT: wife and daughter, [facility] MD Dr Verlon Au.  ? ?Total time: 25 minutes ? ?Thank you for allowing the Palliative Medicine Team to assist in the care of this patient. ?Loistine Chance, MD ? ?Please contact Palliative Medicine Team phone at [phone] for questions and concerns.  ? ? ? ? ? ? ? ? ?

## 2022-02-19 NOTE — Progress Notes (Signed)
PHARMACY - TOTAL PARENTERAL NUTRITION CONSULT NOTE  ? ?Indication: Small bowel obstruction and inability to meet nutritional needs w/ PO intake ? ?Patient Measurements: ?Height: 5' 11"  (180.3 cm) ?Weight: 70.6 kg (155 lb 10.3 oz) ?IBW/kg (Calculated) : 75.3 ?TPN AdjBW (KG): 57.7 ?Body mass index is 21.71 kg/m?. ?Usual Weight: Weight noted in November 2022 to be 63.5 kg ? ?Assessment: 81 yo male presenting to [facility] ED for abdominal pain, nausea, vomiting. PMH includes pancreatic cancer with evidence of metastatic disease to his bladder, lungs, and pleural space and history of SBO.  S/p stent placement between stomach and intestines and colostomy placement in June 2022. Pharmacy is consulted for TPN management for SBO and inability to meet nutritional needs w/ PO intake.  ? ?Glucose / Insulin: No hx DM ?- on moderate SSI q6h (has not required any insulin from SSI in past 24 hours) ?- CBGs <150 ?Electrolytes: Na down 132 ?- other lytes included corrected Cal are wnl ?Renal: SCr 1.20, BUN elevated at 46 ?Hepatic: AST/ALT improved to WNL. Alk Phos elevated, but trending down. Tbili WNL. Albumin low. Prealbumin low but improved to 11.6 (2/28). TG WNL, 130 (2/27) ?Intake / Output; MIVF:  no IVF ?- I/O: + 828 ml ?-UOP 1900 mL (nephrostomy) ?-Oral intake charted: 477 mL liquids  ?GI Imaging: ?- 2/10 CT abdomen pelvis: re-demonstration of high-grade partial SBO with transition point in the upper left pelvis psych likely due to adhesion.  ?- 2/20 abd CT: No evidence of postoperative intra-abdominal abscess. BL pleural effusions. Pulmonary metastatic disease in the lung bases with multiple pulmonary nodules. ?- 2/25 CT: worsened SB loop dilation suggesting ileus or pSBO ?- 3/4 abd xray: findings with concern for persistent partial SBO vs ileus.  CCS indicated that pt is not a candidate for surgery. ?GI Surgeries / Procedures:  ?- 2/14: laparoscopic lysis of adhesions, small bowel resection, excisional bipsy of mesentery mass   ? ?Central access: PAC ?TPN start date: 2/12 ? ?Nutritional Goals: ?Goal TPN rate is 85 mL/hr (provides 112g of protein and 2061 kcals per day) ? ?RD Assessment:  ?Estimated Needs ?Total Energy Estimated Needs: 2050-2250 kcal ?Total Protein Estimated Needs: 105-125 grams ?Total Fluid Estimated Needs: >/= 2.2 L/day ? ?Current Nutrition:  ?Full liquid diet > soft diet ?Boost Breeze TID (refusing most) ?Ensure Plus BID (accepted 1 this AM) ?TPN ? ?- Consideration of NGT/tube feeds per providers ?- Calorie count started on  3/3 ? ?Plan:  ?At 1800: ?- Continue full-rate TPN at 85 ml/hr. F/u Goal of care ?- Electrolytes in TPN: no change from yesterday ?Increase Na to 131mq/L ?K 339m/L ?Ca 90m73mL ?Mg 5 mEq/L ?Phos 190m35mL ?Cl:Ac max Ac ?- Add trace elements daily to TPN. Will add multivitamins M/W/F only in TPN due to national shortage.    ?- Continue moderate SSI q6h and adjust as needed ?- TPN labs on Mon/Thurs ?- cmet, mag and phos on 02/20/22 ?- f/u plans for NGT placement/tube feeds, calorie count, advancement of diet, weaning of TPN  ? ? ?Risha Barretta Dia SitterarmD, BCPS ?02/19/2022 8:03 AM ? ?

## 2022-02-19 NOTE — Plan of Care (Signed)
  Problem: Coping: Goal: Level of anxiety will decrease Outcome: Progressing   Problem: Pain Managment: Goal: General experience of comfort will improve Outcome: Progressing   

## 2022-02-19 NOTE — TOC Progression Note (Addendum)
Transition of Care (TOC) - Progression Note  ? ? ?Patient Details  ?Name: Kevin Villegas ?MRN: [id number] ?Date of Birth: 1941/06/12 ? ?Transition of Care (TOC) CM/SW Contact  ?Ross Ludwig, LCSW ?Phone Number: ?02/19/2022, 2:00 PM ? ?Clinical Narrative:    ? ?CSW received consult that patient and family would like hospice facility placement.  Patient and family would like the hospice facility in [place].  CSW contacted [facility], awaiting for a call back. ? ?3:00pm  Per [facility], they do not have a bed available for patient today, but may have one for tomorrow.  CSW to follow up tomorrow with hospice facility. ? ? ?Expected Discharge Plan: [place] ?Barriers to Discharge: Continued Medical Work up ? ?Expected Discharge Plan and Services ?Expected Discharge Plan: [place] ?  ?Discharge Planning Services: CM Consult ?  ?  ?                ?  ?  ?  ?  ?  ?  ?  ?  ?  ?  ? ? ?Social Determinants of Health (SDOH) Interventions ?  ? ?Readmission Risk Interventions ?Readmission Risk Prevention Plan 02/03/2022 06/16/2021 06/11/2021  ?Transportation Screening Complete Complete Complete  ?PCP or Specialist Appt within 3-5 Days - - Complete  ?[place] or Home Care Consult - - Complete  ?Social Work Consult for [place] Planning/Counseling - - Complete  ?Palliative Care Screening - - Not Applicable  ?Medication Review Press photographer) Complete Complete Complete  ?PCP or Specialist appointment within 3-5 days of discharge Complete Complete -  ?[place] or Home Care Consult Complete Complete -  ?SW Recovery Care/Counseling Consult Complete Complete -  ?Palliative Care Screening Not Applicable Not Applicable -  ?[place] Not Applicable Not Applicable -  ?Some recent data might be hidden  ? ? ?

## 2022-02-19 NOTE — Progress Notes (Signed)
PROGRESS NOTE Kevin Villegas  UXN:[id number] DOB: October 14, 1941 DOA: 01/28/2022 PCP: Kevin Villegas, Kevin Kaufmann, MD   Brief Narrative/Hospital Course: 39 m stage IV pancreatic cancer status post colectomy,  hydronephrosis status post left nephrostomy 10/2021 [last change 01/17/22] hypertension,CKD stage III   P small bowel obstruction on admit 1/20 -->1/23 Re-admit 2/11 ,  2/14 status post diagnostic laparoscopy with lysis of adhesion  complication coffee-ground colored secretion from NG tube, leukocytosis. -Repeat CT scan 2/20 no bowel obstruction, no abscess moderate amount of free fluid in the right upper quadrant, body wall edema noted to the right anterior wall. With persistent leukocytosis but afebrile, being managed with TPN, IV antibiotics-.   2/17-2/22.  completed IV Zosyn  2/21 CTchest no intra-abdominal abscess or obstruction -enlarging left pleural effusion.   ID consulted, underwent left pleural diagnostic tap with 50 mL appears exudative fluid but Gram stain negative, total nucleated cells 25,000, neutrophils100 2/24:placed on Zyvox and underwent small bore chest tube placement by PCCM, lytics done 2/25. 2/26-chest tube placed to suction. 2/27 lower leg edema noted more on left-left duplex ordered came back positive for DVT and placed on heparin drip 3/2 Chest tube out  3/3 imaging suggestive of SBO which is likely malignancy related-we conferred with palliative care-goals of care discussion planned 3/4 Long discussion with palliative care-patient is deciding on [facility] [place]  Subjective:  Tired appearing white male He verbalizes weakly-he is significantly deteriorated since yesterday  Assessment and Plan:   Partial small bowel obstruction stage IV pancreatic cancer continued intra-abdominal abscesses Empyema left pleural space Resolved metabolic encephalopathy Acute left lower extremity DVT this admission Treated E. coli UTI with chronic hydronephrosis CKD stage  IIIa Severe protein energy malnutrition Sacral decubiti  Patient has now been transitioned to full comfort measures-I had a detailed discussion with the patient and family at the bedside with Dr. Rowe Villegas of palliative medicine The patient is resting comfortably-we discussed hospice philosophy and patient will continue antibiotics until he can get to freestanding hospice house [facility] which is first choice Family understands that if there is delay we may need to choose an alternate-we will continue only antibiotics at this juncture but then start comfort measures, discontinue TPN after this bag, and discontinue lab draws frequent monitoring etc. If a bed is required, this patient can transfer to the 6 floor for palliative and comfort trajectory  > 35 minutes care coordination and face-to-face discussion with family   DVT prophylaxis: SCDs Start: 01/29/22 0013. On heparin gtt since 2/27 Code Status:   Code Status: DNR Family Communication: Discussed with family on goals of care discussion a.m.   Disposition:Currently not medically stable for discharge. Status UR:KYHCWCBJS Remains Inpatient appropriate because: ongoing management of SBO/empyema   Objective: Vitals last 24 hrs: Vitals:   02/18/22 2056 02/19/22 0500 02/19/22 0613 02/19/22 1333  BP: 122/74  134/76 (!) 141/86  Pulse: 82  80 78  Resp: 20  (!) 22 16  Temp: 98.6 F (37 C)  98.4 F (36.9 C) 98.6 F (37 C)  TempSrc: Oral  Oral   SpO2: 94%  94% 96%  Weight:  70.6 kg    Height:       Weight change: 0.6 kg  Physical Examination:  Ill-appearing white male in mild cardiorespiratory Anterolateral chest wall clear Abdomen is soft, did not examine wound today Nephrostomy not examined No lower extremity edema Moving 4 limbs equally   Medications reviewed:  Scheduled Meds:  Chlorhexidine Gluconate Cloth  6 each Topical Daily  docusate sodium  100 mg Oral BID   dronabinol  5 mg Oral BID AC   enoxaparin  (LOVENOX) injection  1.5 mg/kg Subcutaneous Q24H   feeding supplement  1 Container Oral TID BM   feeding supplement  237 mL Oral BID BM   insulin aspart  0-15 Units Subcutaneous Q6H   lidocaine  1 patch Transdermal Q24H   lip balm  1 application Topical BID   mouth rinse  15 mL Mouth Rinse BID   methocarbamol  500 mg Oral QID   OLANZapine  2.5 mg Oral QHS   sodium chloride flush  10 mL Other Q8H   sodium chloride flush  3 mL Intravenous Q12H   Continuous Infusions:  sodium chloride Stopped (01/28/22 2301)   [START ON 02/20/2022] ferumoxytol     linezolid (ZYVOX) IV 600 mg (02/19/22 1134)   TPN ADULT (ION) 85 mL/hr at 02/18/22 1739   TPN ADULT (ION)      Pressure Injury 02/13/22 Coccyx Posterior Stage 3 -  Full thickness tissue loss. Subcutaneous fat may be visible but bone, tendon or muscle are NOT exposed. pink red shallow open area with yellow center (Active)  02/13/22 (documented for [name] RN) 2595  Location: Coccyx  Location Orientation: Posterior  Staging: Stage 3 -  Full thickness tissue loss. Subcutaneous fat may be visible but bone, tendon or muscle are NOT exposed.  Wound Description (Comments): pink red shallow open area with yellow center  Present on Admission: Yes (per [name] RN present on assessment)   Diet Order             Diet NPO time specified  Diet effective now                     Data Reviewed: I have personally reviewed following labs and imaging studies CBC: Recent Labs  Lab 02/14/22 0713 02/15/22 0357 02/16/22 0358 02/17/22 0429 02/18/22 0358  WBC 18.4* 20.2* 21.0* 23.4* 22.0*  NEUTROABS 15.1* 15.3* 15.7* 17.6* 16.6*  HGB 10.0* 10.3* 9.3* 9.5* 9.8*  HCT 32.3* 33.2* 29.7* 31.3* 31.7*  MCV 97.0 96.0 96.1 98.4 98.4  PLT 1,049* 1,066* 1,053* 1,020* 1,003*    Basic Metabolic Panel: Recent Labs  Lab 02/14/22 0713 02/15/22 0357 02/16/22 0358 02/17/22 0429 02/19/22 0354  NA 139 141 140 137 132*  K 4.0 4.3 4.1 4.0 4.0  CL  109 107 107 103 99  CO2 '26 28 27 28 26  '$ GLUCOSE 160* 150* 132* 119* 130*  BUN 46* 48* 46* 46* 46*  CREATININE 1.15 1.25* 1.02 1.26* 1.20  CALCIUM 7.9* 8.0* 7.8* 7.8* 7.9*  MG 2.0 2.1  --  2.1 2.0  PHOS 3.6 3.5  --  3.6 3.5    GFR: Estimated Creatinine Clearance: 49 mL/min (by C-G formula based on SCr of 1.2 mg/dL). Liver Function Tests: Recent Labs  Lab 02/13/22 0623 02/14/22 0713 02/15/22 0357 02/16/22 0358 02/17/22 0429  AST 83* 70* 60* 45* 35  ALT 65* 60* 53* 40 32  ALKPHOS 224* 210* 228* 191* 177*  BILITOT 0.3 0.3 0.5 0.1* 0.3  PROT 5.7* 5.3* 5.7* 5.5* 5.6*  ALBUMIN <1.5* <1.5* 1.6* <1.5* 1.5*    No results for input(s): LIPASE, AMYLASE in the last 168 hours. No results for input(s): AMMONIA in the last 168 hours. Coagulation Profile: Recent Labs  Lab 02/14/22 0950  INR 1.2    Cardiac Enzymes:  CBG: Recent Labs  Lab 02/18/22 1227 02/18/22 1806 02/19/22 0008 02/19/22  6010 02/19/22 1115  GLUCAP 120* 93 103* 108* 115*    No results for input(s): PROCALCITON, LATICACIDVEN in the last 168 hours.    Recent Results (from the past 240 hour(s))  Body fluid culture w Gram Stain     Status: None (Preliminary result)   Collection Time: 02/10/22  1:37 PM   Specimen: PATH Cytology Pleural fluid  Result Value Ref Range Status   Specimen Description   Final    PLEURAL Performed at [facility] [address], [place], [place] [address]    Special Requests   Final    NONE Performed at [facility], [place] [address], [place], [place] [address]    Gram Stain FEW WBC SEEN NO ORGANISMS SEEN   Final   Culture   Final    MODERATE ENTEROCOCCUS FAECIUM VANCOMYCIN RESISTANT ENTEROCOCCUS CRITICAL RESULT CALLED TO, READ BACK BY AND VERIFIED WITH: RN LISA.P AT 1144 ON 02/11/2022 BY T.SAAD. Sent to [place] for further susceptibility testing. Performed at [facility], [place] [address] [place], [place] [address]    Report  Status PENDING  Incomplete   Organism ID, Bacteria ENTEROCOCCUS FAECIUM  Final      Susceptibility   Enterococcus faecium - MIC*    AMPICILLIN >=32 RESISTANT Resistant     VANCOMYCIN >=32 RESISTANT Resistant     GENTAMICIN SYNERGY SENSITIVE Sensitive     LINEZOLID 2 SENSITIVE Sensitive     * MODERATE ENTEROCOCCUS FAECIUM     Antimicrobials:     Component Value Date/Time   SDES  02/10/2022 1337    PLEURAL Performed at [facility], [place] [address], [place], [place] [address]    SPECREQUEST  02/10/2022 1337    NONE Performed at [facility], [place] [address], [place], [place] [address]    CULT  02/10/2022 1337    MODERATE ENTEROCOCCUS FAECIUM VANCOMYCIN RESISTANT ENTEROCOCCUS CRITICAL RESULT CALLED TO, READ BACK BY AND VERIFIED WITH: RN LISA.P AT 1144 ON 02/11/2022 BY T.SAAD. Sent to [place] for further susceptibility testing. Performed at [facility], [place] [address], [place], [place] [address]    REPTSTATUS PENDING 02/10/2022 1337  Radiology Studies: DG CHEST PORT 1 VIEW  Result Date: 02/18/2022 CLINICAL DATA:  Shortness of breath. EXAM: PORTABLE CHEST 1 VIEW COMPARISON:  One-view chest x-ray 02/16/2022. FINDINGS: Left lower pneumothorax is stable to minimally increased in size following removal of the chest tube. Airspace densities along the margin of the pneumothorax stable. Interstitial edema in the right lung is slightly increased. Right IJ Port-A-Cath is stable. IMPRESSION: 1. Stable to minimally increased size of left lower pneumothorax following removal of chest tube. 2. Slight increase in interstitial edema in the right lung. Electronically Signed   By: [place] M.D.   On: 02/18/2022 08:35   DG Abd Portable 1V  Result Date: 02/18/2022 CLINICAL DATA:  Abdominal distension. EXAM: PORTABLE ABDOMEN - 1 VIEW COMPARISON:  Abdominal CT 02/12/2022. FINDINGS: Distal duodenal stent is noted with persistent moderate small bowel  dilatation in the mid and right abdomen. There is possible fecalization of the distal small bowel. The colon appears decompressed. Scattered bowel anastomosis clips, cholecystectomy clips, percutaneous left nephrostomy tube and left ureteral stent remain in place. There are mild degenerative changes within the spine. IMPRESSION: Persistent moderate diffuse small bowel dilatation following distal duodenal stenting, similar to recent prior studies, and improved from 01/28/2022. Findings may reflect persistent partial small-bowel obstruction versus ileus. Electronically Signed  By: Richardean Sale M.D.   On: 02/18/2022 11:42     LOS: 21 days   Nita Sells, MD [facility]  02/19/2022, 1:45 PM

## 2022-02-19 NOTE — Progress Notes (Signed)
Unfortunately, looks like he may be trying to develop another small bowel obstruction.  He had abdominal films yesterday which showed possible partial small bowel obstruction.  I do not think there is an ileus since I can hear bowel sounds. ? ?This really complicates the situation.  I do not think that we have to work will be able to put a tube into him to try to feed him enterally at this point. ? ?He says he feels okay.  He is having some discomfort.  His abdomen is little bit distended but bowel sounds are clearly present. ? ?I realize that this really complicates everything.  I just hate the fact that he underwent this major surgery and now is developing another obstruction. ? ?He says that he can try to eat.  I think would be reasonable to see how he does.  I think may be full liquids would be reasonable way to try to get him nutrition.  I just doubt that he would get adequate nutrition via TPN. ? ?His labs this morning show sodium 132.  Potassium 4.0.  BUN 46 creatinine 1.2.  Calcium is 7.9.  There is no CBC back yet today.  A couple days ago, his albumin was 1.5. ? ?His vital signs are temperature 98.4.  Pulse 80.  Blood pressure 134/76.  His lungs are clear bilaterally.  Cardiac exam regular rate and rhythm.  Abdomen is somewhat distended.  Bowel sounds are present.  There is some liquid stool in the colostomy.  There is little bit of tenderness to palpation.  Extremity shows no clubbing, cyanosis or edema.  He is on the Lovenox for the DVT. ? ?At this point, I think that we really need to think about trying to transition him to more comfort care.  Again, I am not sure he really appreciates the problem that we have with respect to the bowel obstruction.  I told him that there is really no way that we can try to help this at this point.  He has already had surgery.  Putting an NG tube down him would only be a temporary measure and he would obstruct again. ? ?I think that we will be a meeting with palliative  care, the primary team, and the patient and family today.  I just do not see that he is going to be a candidate for any treatment in the future. ? ?It just seems to be apparent to me that his body is wearing out. ? ?I know this is incredibly complicated.  I do appreciate everybody's help with Kevin Villegas. ? ?Lattie Haw, MD ? ?Revelation 21;4 ?

## 2022-02-19 NOTE — Plan of Care (Signed)
  Problem: Coping: Goal: Level of anxiety will decrease Outcome: Progressing   Problem: Pain Managment: Goal: General experience of comfort will improve Outcome: Progressing   Problem: Safety: Goal: Ability to remain free from injury will improve Outcome: Progressing   Problem: Skin Integrity: Goal: Risk for impaired skin integrity will decrease Outcome: Progressing   

## 2022-02-20 LAB — GLUCOSE, CAPILLARY
Glucose-Capillary: 61 mg/dL — ABNORMAL LOW (ref 70–99)
Glucose-Capillary: 76 mg/dL (ref 70–99)
Glucose-Capillary: 89 mg/dL (ref 70–99)
Glucose-Capillary: 94 mg/dL (ref 70–99)

## 2022-02-20 NOTE — Progress Notes (Incomplete)
PROGRESS NOTE Kevin Villegas  ZOX:[id number] DOB: Jul 25, 1941 DOA: 01/28/2022 PCP: Dettinger, [name], MD   Brief Narrative/Hospital Course: 89 m stage IV pancreatic cancer status post colectomy,  hydronephrosis status post left nephrostomy 10/2021 [last change 01/17/22] hypertension,CKD stage III   P SBO on admit 1/20 -->1/23 Re-admit 2/11 ,  2/14 status post diagnostic laparoscopy with lysis of adhesion  complication coffee-ground colored secretion from NG tube, leukocytosis. -Repeat CT scan 2/20 no bowel obstruction, no abscess moderate amount of free fluid in the right upper quadrant, body wall edema noted to the right anterior wall. With persistent leukocytosis but afebrile, being managed with TPN, IV antibiotics-.   2/17-2/22.  completed IV Zosyn  2/21 CTchest no intra-abdominal abscess or obstruction -enlarging left pleural effusion.   ID consulted, underwent left pleural diagnostic tap with 50 mL appears exudative fluid but Gram stain negative, total nucleated cells 25,000, neutrophils100 2/24:placed on Zyvox and underwent small bore chest tube placement by PCCM, lytics done 2/25. 2/26-chest tube placed to suction. 2/27 lower leg edema noted more on left-left duplex ordered came back positive for DVT and placed on heparin drip 3/2 Chest tube out  3/3 imaging suggestive of SBO which is likely malignancy related-we conferred with palliative care-goals of care discussion planned 3/4 Long discussion with palliative care-patient is deciding on hospice of [place]  Subjective:  Tired appearing white male He verbalizes weakly-he is significantly deteriorated since yesterday  Assessment and Plan:   Partial small bowel obstruction stage IV pancreatic cancer continued intra-abdominal abscesses Empyema left pleural space Resolved metabolic encephalopathy Acute left lower extremity DVT this admission Treated E. coli UTI with chronic hydronephrosis CKD stage IIIa Severe protein  energy malnutrition Sacral decubiti  Patient has now been transitioned to full comfort measures-I had a detailed discussion with the patient and family at the bedside with Dr. Rowe Pavy of palliative medicine The patient is resting comfortably-we discussed hospice philosophy and patient will continue antibiotics until he can get to freestanding hospice house in [facility] which is first choice Family understands that if there is delay we may need to choose an alternate-we will continue only antibiotics at this juncture but then start comfort measures, discontinue TPN after this bag, and discontinue lab draws frequent monitoring etc. If a bed is required, this patient can transfer to the 6 floor for palliative and comfort trajectory  > 35 minutes care coordination and face-to-face discussion with family   DVT prophylaxis: SCDs Start: 01/29/22 0013. On heparin gtt since 2/27 Code Status:   Code Status: DNR Family Communication: Discussed with family on goals of care discussion a.m.   Disposition:Currently not medically stable for discharge. Status WJ:[id number] Remains Inpatient appropriate because: ongoing management of SBO/empyema   Objective: Vitals last 24 hrs: Vitals:   02/19/22 0613 02/19/22 1333 02/19/22 2122 02/20/22 0607  BP: 134/76 (!) 141/86 (!) 139/91 135/85  Pulse: 80 78 87 79  Resp: (!) '22 16 14 18  '$ Temp: 98.4 F (36.9 C) 98.6 F (37 C) 98.3 F (36.8 C) 98.2 F (36.8 C)  TempSrc: Oral  Oral Oral  SpO2: 94% 96% 95% 96%  Weight:      Height:       Weight change:   Physical Examination:  Ill-appearing white male in mild cardiorespiratory Anterolateral chest wall clear Abdomen is soft, did not examine wound today Nephrostomy not examined No lower extremity edema Moving 4 limbs equally   Medications reviewed:  Scheduled Meds:  Chlorhexidine Gluconate Cloth  6 each Topical Daily  lidocaine  1 patch Transdermal Q24H   lip balm  1 application Topical BID    mouth rinse  15 mL Mouth Rinse BID   OLANZapine  2.5 mg Oral QHS   Continuous Infusions:  linezolid (ZYVOX) IV 600 mg (02/20/22 0837)    Pressure Injury 02/13/22 Coccyx Posterior Stage 3 -  Full thickness tissue loss. Subcutaneous fat may be visible but bone, tendon or muscle are NOT exposed. pink red shallow open area with yellow center (Active)  02/13/22 (documented for [name] RN) [id number]  Location: Coccyx  Location Orientation: Posterior  Staging: Stage 3 -  Full thickness tissue loss. Subcutaneous fat may be visible but bone, tendon or muscle are NOT exposed.  Wound Description (Comments): pink red shallow open area with yellow center  Present on Admission: Yes (per [name] RN present on assessment)   Diet Order             Diet - low sodium heart healthy                     Data Reviewed: I have personally reviewed following labs and imaging studies CBC: Recent Labs  Lab 02/14/22 0713 02/15/22 0357 02/16/22 0358 02/17/22 0429 02/18/22 0358  WBC 18.4* 20.2* 21.0* 23.4* 22.0*  NEUTROABS 15.1* 15.3* 15.7* 17.6* 16.6*  HGB 10.0* 10.3* 9.3* 9.5* 9.8*  HCT 32.3* 33.2* 29.7* 31.3* 31.7*  MCV 97.0 96.0 96.1 98.4 98.4  PLT 1,049* 1,066* 1,053* 1,020* 1,003*    Basic Metabolic Panel: Recent Labs  Lab 02/14/22 0713 02/15/22 0357 02/16/22 0358 02/17/22 0429 02/19/22 0354  NA 139 141 140 137 132*  K 4.0 4.3 4.1 4.0 4.0  CL 109 107 107 103 99  CO2 '26 28 27 28 26  '$ GLUCOSE 160* 150* 132* 119* 130*  BUN 46* 48* 46* 46* 46*  CREATININE 1.15 1.25* 1.02 1.26* 1.20  CALCIUM 7.9* 8.0* 7.8* 7.8* 7.9*  MG 2.0 2.1  --  2.1 2.0  PHOS 3.6 3.5  --  3.6 3.5    GFR: Estimated Creatinine Clearance: 49 mL/min (by C-G formula based on SCr of 1.2 mg/dL). Liver Function Tests: Recent Labs  Lab 02/14/22 0713 02/15/22 0357 02/16/22 0358 02/17/22 0429  AST 70* 60* 45* 35  ALT 60* 53* 40 32  ALKPHOS 210* 228* 191* 177*  BILITOT 0.3 0.5 0.1* 0.3  PROT 5.3* 5.7*  5.5* 5.6*  ALBUMIN <1.5* 1.6* <1.5* 1.5*    No results for input(s): LIPASE, AMYLASE in the last 168 hours. No results for input(s): AMMONIA in the last 168 hours. Coagulation Profile: Recent Labs  Lab 02/14/22 0950  INR 1.2    Cardiac Enzymes:  CBG: Recent Labs  Lab 02/19/22 1758 02/20/22 0003 02/20/22 0603 02/20/22 0647 02/20/22 0831  GLUCAP 71 94 61* 89 76    No results for input(s): PROCALCITON, LATICACIDVEN in the last 168 hours.    No results found for this or any previous visit (from the past 240 hour(s)).    Antimicrobials:     Component Value Date/Time   SDES  02/10/2022 1337    PLEURAL Performed at [facility], [place] [address], [place], [place] [address]    SPECREQUEST  02/10/2022 1337    NONE Performed at [facility], [place] [address], [place], [place] [address]    CULT  02/10/2022 1337    MODERATE ENTEROCOCCUS FAECIUM VANCOMYCIN RESISTANT ENTEROCOCCUS CRITICAL RESULT CALLED TO, READ BACK BY AND VERIFIED WITH: RN LISA.P  AT 1144 ON 02/11/2022 BY T.SAAD. Sent to [place] for further susceptibility testing. Performed at [facility], [place] [address], [place], [place] [address]    REPTSTATUS PENDING 02/10/2022 1337  Radiology Studies: No results found.   LOS: 22 days   Nita Sells, MD [facility]  02/20/2022, 5:09 PM

## 2022-02-20 NOTE — Progress Notes (Signed)
Patient seen and examined earlier today and then revisited after he had sustained a fall at 450-when I last saw him he was verbalizing on the phone with his wife who the nurse had called ?He is stable to transfer to hospice facility when available likely tomorrow-if not we will have to search for another hospice facility ?DNR will be signed ? ?No charge-see my discharge summary ? ?Verneita Griffes, MD ?[facility] ?5:58 PM ? ?

## 2022-02-20 NOTE — Progress Notes (Signed)
? ?                                                                                                                                                     ?                                                   ?Daily Progress Note  ? ?Patient Name: Kevin Villegas       Date: 02/20/2022 ?DOB: 1941-01-29  Age: 81 y.o. MRN#: [id number] ?Attending Physician: Nita Sells, MD ?Primary Care Physician: Dettinger, [name], MD ?Admit Date: 01/28/2022 ? ?Reason for Consultation/Follow-up: Establishing goals of care ? ?Subjective: ?Resting in bed, nursing staff at bedside.  ?Offered active listening, supportive presence and other therapeutic techniques, patient is aware that he will be transferred to residential hospice once they have a bed.  ?  ? ?Length of Stay: 22 ? ?Current Medications: ?Scheduled Meds:  ? Chlorhexidine Gluconate Cloth  6 each Topical Daily  ? lidocaine  1 patch Transdermal Q24H  ? lip balm  1 application Topical BID  ? mouth rinse  15 mL Mouth Rinse BID  ? OLANZapine  2.5 mg Oral QHS  ? ? ?Continuous Infusions: ? linezolid (ZYVOX) IV 600 mg (02/20/22 0837)  ? ? ?PRN Meds: ?acetaminophen, antiseptic oral rinse, antiseptic oral rinse, atropine, fentaNYL (SUBLIMAZE) injection, haloperidol **OR** haloperidol **OR** haloperidol lactate, iohexol, menthol-cetylpyridinium, morphine CONCENTRATE **OR** morphine CONCENTRATE, oxyCODONE, phenol, polyvinyl alcohol, prochlorperazine, traZODone ? ?         ?Appears chronically ill  ?Resting in bed ?No edema ?Awake alert ?S 1 S 2  ?Tender over lower abdomen mild to moderate.  ? ?Vital Signs: BP 135/85 (BP Location: Left Arm)   Pulse 79   Temp 98.2 ?F (36.8 ?C) (Oral)   Resp 18   Ht '5\' 11"'$  (1.803 m)   Wt 70.6 kg   SpO2 96%   BMI 21.71 kg/m?  ?SpO2: SpO2: 96 % ?O2 Device: O2 Device: Room Air ?O2 Flow Rate: O2 Flow Rate (L/min): 2 L/min ? ?Intake/output summary:  ?Intake/Output Summary (Last 24 hours) at 02/20/2022 0955 ?Last data filed at 02/20/2022 0307 ?Gross per 24  hour  ?Intake 939.98 ml  ?Output 900 ml  ?Net 39.98 ml  ? ?LBM: Last BM Date : 02/20/22 ?Baseline Weight: Weight: 57 kg ?Most recent weight: Weight: 70.6 kg ? ?     ?Palliative Assessment/Data: ? ? ? ? ? ?Patient Active Problem List  ? Diagnosis Date Noted  ? Pressure injury of skin 02/15/2022  ? Thrombocytosis 02/15/2022  ? Acute metabolic encephalopathy 94/76/5465  ? Leg DVT (deep venous thromboembolism), acute, left ([place]) 02/14/2022  ? Leg edema, left 02/13/2022  ?  Empyema lung ([place])   ? Goals of care, counseling/discussion 02/10/2022  ? Empyema of left pleural space ([place]) 02/10/2022  ? E-coli UTI 02/01/2022  ? SBO (small bowel obstruction) ([place]) 01/28/2022  ? Subcutaneous nodule of abdominal wall 01/09/2022  ? Partial small bowel obstruction ([place]) 01/07/2022  ? COVID-19 virus infection 01/07/2022  ? Colostomy in place [facility]) 11/10/2021  ? Stage 3a chronic kidney disease (CKD) ([place]) 11/10/2021  ? Pyelonephritis   ? Essential hypertension 10/30/2021  ? Hydronephrosis of left kidney 10/28/2021  ? Ureteral stricture, left 10/27/2021  ? Protein-calorie malnutrition, severe 06/12/2021  ? Hydroureter on right 06/10/2021  ? Aortic atherosclerosis ([place]) 06/10/2021  ? Leukocytosis 06/02/2021  ? Right ureteral stone 06/02/2021  ? Colon stricture ([place])   ? Pancreatic mass 06/27/2019  ? 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 Rhinitis medicamentosa 02/26/2019  ? ? ?Palliative Care Assessment & Plan  ? ?Patient Profile: ?  ? ?Assessment: ? 81 year old gentleman who lives at home with his wife in [place], [place].  His daughter lives nearby.  He has a past medical history of stage IV pancreatic cancer, status post colectomy, hydronephrosis status post nephrostomy, underlying hypertension small bowel obstruction stage II chronic kidney disease.  Patient presented with ongoing abdominal pain, known to have concerns for small bowel obstruction issues.  Underwent a diagnostic  laparoscopy with lysis of additions on 2-14.  He is being followed by general surgery and medical oncology.  Has been placed on TPN for nutritional support.  Has been placed on NG tube for suction.  So far, patient has not not had any bowel movements or passed flatus. ?Diet was slowly advanced NG tube removed at this time patient is tolerating diet having output in colostomy bag, having output in nephrostomy tube.  ?  ? CT scan 2/21 no intra-abdominal abscess or obstruction but shows enlarging left pleural effusion.   ?ID consulted, underwent left pleural diagnostic tap with 50 mL appears exudative fluid but Gram stain negative, total nucleated cells 25,000, neutrophils100 ?  ?2/24:placed on Zyvox and underwent small bore chest tube placement by PCCM, lytics done 2/25. ?2/26-chest tube placed to suction. ?2/27 lower leg edema noted more on left-left duplex ordered came back positive for DVT and placed on heparin drip ?Ongoing GOC discussion by palliative care-continue with current DNR and full scope of treatment. Under consideration for TEE, however, patient with worsening abdominal pain and distension with concern for SBO in the setting of metastatic cancer.  ?  ?Recommendations/Plan: ?DNR ?Awaiting residential hospice transfer once bed is available.   ? ?Code Status: ? ?  ?Code Status Orders  ?(From admission, onward)  ?  ? ? ?  ? ?  Start     Ordered  ? 02/06/22 1335  Do not attempt resuscitation (DNR)  Continuous       ?Question Answer Comment  ?In the event of cardiac or respiratory ARREST Do not call a ?code blue?   ?In the event of cardiac or respiratory ARREST Do not perform Intubation, CPR, defibrillation or ACLS   ?In the event of cardiac or respiratory ARREST Use medication by any route, position, wound care, and other measures to relive pain and suffering. May use oxygen, suction and manual treatment of airway obstruction as needed for comfort.   ?  ? 02/06/22 1334  ? ?  ?  ? ?  ? ?Code Status History    ? ? Date Active Date Inactive Code Status  Order ID Comments User Context  ? 01/29/2022 0014 02/06/2022 1334 Full Code [id number]  Marcelyn Bruins, MD Inpatient  ? 01/07/2022 2129 01/10/2022 2258 Full Code [id number]  Rise Patience, MD Inpatient  ? 12/10/2021 1331 12/11/2021 2137 Full Code [id number]  Jonnie Finner, DO ED  ? 11/10/2021 0354 11/13/2021 2039 Full Code [id number]  Chotiner, Yevonne Aline, MD Inpatient  ? 10/27/2021 2317 10/30/2021 1833 Full Code [id number]  Lenore Cordia, MD Inpatient  ? 06/09/2021 2201 06/18/2021 1923 Full Code [id number]  Lenore Cordia, MD Inpatient  ? 06/01/2021 2102 06/05/2021 1830 Full Code [id number]  Rhetta Mura, DO ED  ? 05/18/2021 1200 05/26/2021 1958 Partial Code [id number]  Jonnie Finner, DO Inpatient  ? 12/12/2014 1730 12/17/2014 1704 Full Code [id number]  Velvet Bathe, MD Inpatient  ? ?  ? ? ?Prognosis: ?Less than 2 weeks.  ? ?Discharge Planning: ?Residential hospice.  ? ?Care plan was discussed with patient and bedside RN who was in the room.  ? ?Total time: 15 minutes ? ?Thank you for allowing the Palliative Medicine Team to assist in the care of this patient. ?Loistine Chance, MD ? ?Please contact Palliative Medicine Team phone at [phone] for questions and concerns.  ? ? ? ? ? ? ? ? ?

## 2022-02-20 NOTE — TOC Progression Note (Signed)
Transition of Care (TOC) - Progression Note  ? ? ?Patient Details  ?Name: Kevin Villegas ?MRN: [id number] ?Date of Birth: Dec 06, 1941 ? ?Transition of Care (TOC) CM/SW Contact  ?Ross Ludwig, LCSW ?Phone Number: ?02/20/2022, 12:29 PM ? ?Clinical Narrative:    ? ?CSW spoke to [facility], [phone], they do not have a bed available today.  CSW awaiting for bed availability for patient. ? ?Expected Discharge Plan: [place] ?Barriers to Discharge: Continued Medical Work up ? ?Expected Discharge Plan and Services ?Expected Discharge Plan: [place] ?  ?Discharge Planning Services: CM Consult ?  ?  ?                ?  ?  ?  ?  ?  ?  ?  ?  ?  ?  ? ? ?Social Determinants of Health (SDOH) Interventions ?  ? ?Readmission Risk Interventions ?Readmission Risk Prevention Plan 02/03/2022 06/16/2021 06/11/2021  ?Transportation Screening Complete Complete Complete  ?PCP or Specialist Appt within 3-5 Days - - Complete  ?[place] or Home Care Consult - - Complete  ?Social Work Consult for [place] Planning/Counseling - - Complete  ?Palliative Care Screening - - Not Applicable  ?Medication Review Press photographer) Complete Complete Complete  ?PCP or Specialist appointment within 3-5 days of discharge Complete Complete -  ?[place] or Home Care Consult Complete Complete -  ?SW Recovery Care/Counseling Consult Complete Complete -  ?Palliative Care Screening Not Applicable Not Applicable -  ?[place] Not Applicable Not Applicable -  ?Some recent data might be hidden  ? ? ?

## 2022-02-20 NOTE — Discharge Summary (Signed)
Physician Discharge Summary   Patient: Kevin Villegas MRN: [id number] DOB: 05/14/1941  Admit date:     01/28/2022  Discharge date: 02/21/22  Discharge Physician: Nita Sells   PCP: Dettinger, [name], MD   Recommendations at discharge:  Discharging to free-standing Hospice a.m. 02/21/2022  Discharge Diagnoses: Principal Problem:   Partial small bowel obstruction (HCC) Active Problems:   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)M1  with metastatic disease   Leukocytosis   Protein-calorie malnutrition, severe   Hydronephrosis of left kidney   Essential hypertension   Colostomy in place [facility])   Stage 3a chronic kidney disease (CKD) ([place])   E-coli UTI   Goals of care, counseling/discussion   Empyema of left pleural space ([place])   Empyema lung ([place])   Leg DVT (deep venous thromboembolism), acute, left (HCC)   Pressure injury of skin   Thrombocytosis   Acute metabolic encephalopathy  Resolved Problems:   * No resolved hospital problems. *   Hospital Course:  Brief Narrative/Hospital Course: 81 m stage IV pancreatic cancer status post colectomy,  hydronephrosis status post left nephrostomy 10/2021 [last change 01/17/22] hypertension,CKD stage III    P SBO on admit 1/20 -->1/23 Re-admit 2/11 ,  2/14 status post diagnostic laparoscopy with lysis of adhesion  complication coffee-ground colored secretion from NG tube, leukocytosis. -Repeat CT scan 2/20 no bowel obstruction, no abscess moderate amount of free fluid in the right upper quadrant, body wall edema noted to the right anterior wall. With persistent leukocytosis but afebrile, being managed with TPN, IV antibiotics-.   2/17-2/22.  completed IV Zosyn  2/21 CTchest no intra-abdominal abscess or obstruction -enlarging left pleural effusion.   ID consulted, underwent left pleural diagnostic tap with 50 mL appears exudative fluid but Gram stain negative, total nucleated cells 25,000, neutrophils100 2/24:placed on  Zyvox and underwent small bore chest tube placement by PCCM, lytics done 2/25. 2/26-chest tube placed to suction. 2/27 lower leg edema noted more on left-left duplex ordered came back positive for DVT and placed on heparin drip 3/2 Chest tube out  3/3 imaging suggestive of SBO which is likely malignancy related-we conferred with palliative care-goals of care discussion planned 3/4 Long discussion with palliative care-patient is deciding on hospice of [place]   Subjective:   Tired appearing white male He verbalizes weakly-he is significantly deteriorated since yesterday   Assessment and Plan:     Partial small bowel obstruction stage IV pancreatic cancer continued intra-abdominal abscesses Empyema left pleural space Resolved metabolic encephalopathy Acute left lower extremity DVT this admission Treated E. coli UTI with chronic hydronephrosis CKD stage IIIa Severe protein energy malnutrition Sacral decubiti   Patient has now been transitioned to full comfort measures-I had a detailed discussion with the patient and family at the bedside with Dr. Rowe Pavy of palliative medicine on 02/19/2022 Patient sustained a fall on 3/5 however he is stable at this time talking to his wife on the phone and has not had any untoward effect He began becoming a little bit more confused on 3/5 and delirious probably secondary to medications for comfort-I have alerted nursing to adjust medications as per palliative care  His prognosis is poor-I expect he has less than 2 weeks to live if we de-escalate completely off of antibiotics on discharge         Consultants: Multiple-palliative care General surgery GI radiology Procedures performed: Multiple Disposition:  Hospice facility Diet recommendation:  Discharge Diet Orders (From admission, onward)     Start  Ordered   02/20/22 0000  Diet - low sodium heart healthy        02/20/22 1708           Comfort feeds as he is able to  tolerate  DISCHARGE MEDICATION: Allergies as of 02/20/2022       Reactions   Pollen Extract Other (See Comments)   Sinus congestion        Medication List     STOP taking these medications    mirtazapine 15 MG tablet Commonly known as: REMERON   multivitamin with minerals Tabs tablet   polyethylene glycol 17 g packet Commonly known as: MIRALAX / GLYCOLAX         Discharge Exam: Filed Weights   02/17/22 0500 02/18/22 0500 02/19/22 0500  Weight: 70.6 kg 70 kg 70.6 kg   Tired appearing white male awake but incoherent-not making much sense Continues to complain about "Joe Biden" for some reason-unclear if he knows time date or year and continues to ramble-even chest rise no rales no rhonchi S1-S2 no murmur Abdomen distended no rebound no guarding I did not examine ostomy  Condition at discharge: worsening  The results of significant diagnostics from this hospitalization (including imaging, microbiology, ancillary and laboratory) are listed below for reference.   Imaging Studies: CT ABDOMEN PELVIS WO CONTRAST  Result Date: 02/12/2022 CLINICAL DATA:  Abdominal pain, pancreatic carcinoma EXAM: CT ABDOMEN AND PELVIS WITHOUT CONTRAST TECHNIQUE: Multidetector CT imaging of the abdomen and pelvis was performed following the standard protocol without IV contrast. RADIATION DOSE REDUCTION: This exam was performed according to the departmental dose-optimization program which includes automated exposure control, adjustment of the mA and/or kV according to patient size and/or use of iterative reconstruction technique. COMPARISON:  02/07/2022 FINDINGS: Lower chest: There is interval placement of left chest tube with a decrease in the amount of left pleural effusion. There is residual moderate bilateral pleural effusions. Atelectatic changes are noted in the lower lung fields, more so on the left side. There are multiple noncalcified nodules of varying sizes in both lower lung fields  suggesting pulmonary metastatic disease. Hepatobiliary: There are few low-density lesions in the liver largest measuring 2.3 cm in the right lobe. There is no significant interval change. Density measurements suggest possible cysts. Surgical clips are seen in gallbladder fossa. Pancreas: No focal abnormality is seen. Spleen: Not seen Adrenals/Urinary Tract: There is mild nodularity in the adrenals. There is marked right hydronephrosis. Right ureter is dilated. There is percutaneous left nephrostomy tube ureteral stent. There is no hydronephrosis in the left kidney. There is 3.1 cm cyst in the upper pole of right kidney. Right kidney is smaller than left with cortical atrophy. Stomach/Bowel: Stomach is not distended. There is stent in the distal duodenum. There is dilation of small-bowel loops measuring up to 5.5 cm in maximum diameter. Appendix is not distinctly seen. Scattered diverticula are seen in colon without signs of focal diverticulitis. There is liquid stool in the rectum. Colostomy is seen in left paramedian location slightly above the level of umbilicus. Vascular/Lymphatic: Scattered arterial calcifications are seen. Reproductive: Unremarkable. Other: There is diffuse edema in subcutaneous plane suggesting anasarca. There is 2.7 cm soft tissue nodule in the right anterior abdominal wall with no significant change. There is no pneumoperitoneum. Small to moderate ascites is present. Musculoskeletal: Degenerative changes are noted in the lumbar spine with spinal stenosis and encroachment of neural foramina at multiple levels. IMPRESSION: There is decrease in left pleural effusion after placement of left chest  tube. Moderate bilateral pleural effusions are present. There are multiple noncalcified nodules of varying sizes in the lower lung fields consistent with pulmonary metastatic disease. There is dilation of small-bowel loops with interval worsening suggesting ileus or partial obstruction. Diverticulosis  of colon without signs of diverticulitis. Severe chronic dilation of collecting system and ureter on the right side with no significant change. There is percutaneous nephrostomy and ureteral stent on the left side. Small to moderate ascites. Other findings as described in the body of the report. Electronically Signed   By: Elmer Picker M.D.   On: 02/12/2022 14:01   CT ABDOMEN PELVIS WO CONTRAST  Result Date: 02/08/2022 CLINICAL DATA:  Abdominal pain. Postop, assess for intra-abdominal abscess. Patient is post small bowel resection and excisional biopsy of mesenteric mass on 02/01/2022 EXAM: CT ABDOMEN AND PELVIS WITHOUT CONTRAST TECHNIQUE: Multidetector CT imaging of the abdomen and pelvis was performed following the standard protocol without IV contrast. Patient declined IV contrast administration. RADIATION DOSE REDUCTION: This exam was performed according to the departmental dose-optimization program which includes automated exposure control, adjustment of the mA and/or kV according to patient size and/or use of iterative reconstruction technique. COMPARISON:  Preoperative CT 01/28/2022 FINDINGS: Lower chest: Pulmonary metastatic disease in the lung bases with multiple pulmonary nodules. Enlarging left pleural effusion with pleural thickening, possibly metastatic. Increasing compressive atelectasis in the included left lung base. Moderate-sized right pleural effusion is new. Hepatobiliary: Scattered water density lesions in the liver are unchanged from prior exam likely cysts cholecystectomy. Common bile duct is difficult defined, there is no visualized biliary dilatation. Pancreas: The pancreas is difficult to delineate on the current exam, electronic records states distal pancreatectomy. Spleen: Surgically absent. Adrenals/Urinary Tract: No adrenal nodule. There is right hydroureteronephrosis with the ureter being dilated to the pelvis, the previous hyperattenuating focus in the distal right ureter  tentatively visualized series 2, image 75. Marked right renal atrophy. Left nephrostomy and left nephroureteral stent in place. There is no left hydronephrosis. The urinary bladder is poorly defined, appears thick walled. There is air in the bladder. Stomach/Bowel: Detailed bowel assessment is limited given paucity of intra-abdominal fat and motion. Enteric tube terminates in the gastroesophageal junction. The stomach is decompressed. A duodenal stent is unchanged in position, patent. Descending colostomy with Hartmann's pouch. Small bowel is mildly prominent but nonobstructed, and enteric contrast is seen throughout the colon. Bowel loops in the pelvis are difficult to define, there is fluid and stool within the and set to sigmoid colon. Vascular/Lymphatic: Aortic atherosclerosis without aneurysm. Limited assessment for adenopathy given lack of IV contrast and motion artifact. Reproductive: Prostate is unremarkable. Other: There is a small amount of non organized free fluid in the pelvis. Moderate right upper quadrant ascites. Generalized edema of the intra-abdominal and mesenteric fat. There is no evidence of focal fluid collection/abscess. No free air. Diffuse subcutaneous edema. Soft tissue nodule in the right anterior abdominal wall. Musculoskeletal: No focal bone lesion. Degenerative change in the spine and hips. IMPRESSION: 1. No evidence of postoperative intra-abdominal abscess. Moderate right upper quadrant free fluid and small amount of free fluid in the pelvis is non organized. 2. Enteric tube terminates at the gastroesophageal junction. 3. Left nephrostomy and left nephroureteral stent in place without left hydronephrosis. Chronic right renal atrophy and hydroureteronephrosis. 4. Pulmonary metastatic disease in the lung bases with multiple pulmonary nodules. Enlarging left pleural effusion with pleural thickening, possibly metastatic. Moderate-sized right pleural effusion is new. 5. Diffuse body wall  edema. 6. Soft tissue  nodule in the right anterior abdominal wall is unchanged, possibly metastatic. 7. Additional chronic findings are unchanged. Aortic Atherosclerosis (ICD10-I70.0). Electronically Signed   By: Keith Rake M.D.   On: 02/08/2022 00:13   CT ABDOMEN PELVIS WO CONTRAST  Result Date: 01/28/2022 CLINICAL DATA:  Abdominal pain. Suspicion of bowel obstruction. History of colostomy EXAM: CT ABDOMEN AND PELVIS WITHOUT CONTRAST TECHNIQUE: Multidetector CT imaging of the abdomen and pelvis was performed following the standard protocol without IV contrast. RADIATION DOSE REDUCTION: This exam was performed according to the departmental dose-optimization program which includes automated exposure control, adjustment of the mA and/or kV according to patient size and/or use of iterative reconstruction technique. COMPARISON:  01/07/2022 FINDINGS: Lower chest: Bilateral pulmonary nodules consistent with metastasis. Index right lower lobe 2.2 cm nodule on 12/04 is similar to on the prior. Lingular nodule measures 1.2 cm on 12/04. Loculated left-sided pleural effusion with pleural thickening, similar. Hepatobiliary: Hepatic cysts. Cholecystectomy, without biliary ductal dilatation. Pancreas: Per report partial pancreatectomy. Pancreas poorly evaluated secondary to technique and paucity of retroperitoneal fat. Spleen: Surgically absent Adrenals/Urinary Tract: Normal adrenal glands. Left ureteric stent and nephrostomy catheter in place. Marked right renal atrophy with chronic hydroureteronephrosis. This is followed to an area of persistent distal right ureteric hyperattenuation including on 77/2. Stomach/Bowel: The stomach is decompressed, appears mildly thick walled. A duodenal stent is similar in position, without upstream dilatation. Hartmann's pouch. The remaining sigmoid is thick walled. Descending colostomy. Proximal and mid small bowel loops are fluid-filled and dilated including at up to 4.3 cm on 47/2.  4.9 cm at the same level on the prior. Transition point in the left upper pelvis again suspected, including on 67/2. Distal small bowel loops are decompressed. Normal appendix. Vascular/Lymphatic: Aortic atherosclerosis. No gross abdominopelvic adenopathy. Reproductive: Mild prostatomegaly. Other: No significant free fluid. No free intraperitoneal air. Right abdominal wall subcutaneous 2.6 cm nodule on 46/2 is similar to on the prior. Suspect developing left chest wall/intercostal metastasis, as evidenced by soft tissue fullness on 01/02. Musculoskeletal: Bilateral hip osteoarthritis. Lumbosacral spondylosis. IMPRESSION: 1. Degraded exam, secondary to lack of oral or IV contrast. 2. Since 01/07/2022, redemonstration of high-grade partial small-bowel obstruction with transition point in the upper left pelvis psych: likely due to an adhesion. 3. Pulmonary metastasis with loculated left-sided pleural effusion and probable anterior left chest wall intercostal metastasis. 4. Right renal atrophy and hydroureteronephrosis. Possible distal right ureteric stones versus retained contrast. 5. Similar right abdominal wall soft tissue nodule, suspicious for metastatic disease. Electronically Signed   By: Abigail Miyamoto M.D.   On: 01/28/2022 18:05   DG Chest 1 View  Result Date: 02/10/2022 CLINICAL DATA:  Status post thoracentesis EXAM: CHEST  1 VIEW COMPARISON:  02/09/2022 FINDINGS: Numerous bilateral pulmonary nodules. Trace right pleural effusion. Loculated left pleural effusion. No significant interval change compared with the prior exam. No pneumothorax. Stable cardiomediastinal silhouette. Right-sided Port-A-Cath in satisfactory position. No acute osseous abnormality. IMPRESSION: 1. No significant interval change compared with the prior exam. No pneumothorax. 2. Numerous bilateral pulmonary nodules. 3. Trace right pleural effusion. 4. Loculated left pleural effusion. Electronically Signed   By: Kathreen Devoid M.D.   On:  02/10/2022 13:55   DG Abd 1 View  Result Date: 02/05/2022 CLINICAL DATA:  NG tube placement. EXAM: ABDOMEN - 1 VIEW COMPARISON:  02/02/2022 FINDINGS: Similar central gaseous bowel distention noted upper abdomen. Stent device noted left paramidline abdomen with percutaneous and internal left urinary stents. NG tube tip is positioned in the distal esophagus  just proximal to the esophagogastric junction. Tube could be advanced 13-15 cm to place the proximal side port below the GE junction. IMPRESSION: NG tube tip is in the distal esophagus just proximal to the esophagogastric junction. Tube could be advanced 13-15 cm to place the proximal side port below the GE junction. Repeat x-ray could be performed at that time to ensure appropriate positioning. Electronically Signed   By: Misty Breiner M.D.   On: 02/05/2022 12:54   DG Abd 1 View  Result Date: 02/02/2022 CLINICAL DATA:  Abdominal pain EXAM: ABDOMEN - 1 VIEW COMPARISON:  CT abdomen and pelvis 01/28/2022 FINDINGS: Abnormally distended gas-filled loops of small bowel in the mid abdomen measuring up to 6.7 cm in diameter in the supine position. Enteric tube tip is in the region of the stomach. Left-sided percutaneous nephrostomy tube and ureteral stent. Cholecystectomy clips. Vascular stent graft in the left mid abdomen. IMPRESSION: Abnormal bowel gas pattern suggesting small bowel obstruction or regional ileus. Electronically Signed   By: Ofilia Neas M.D.   On: 02/02/2022 11:08   US RENAL  Result Date: 02/04/2022 CLINICAL DATA:  Hydronephrosis EXAM: RENAL / URINARY TRACT ULTRASOUND COMPLETE COMPARISON:  CT 01/28/2022 FINDINGS: Right Kidney: Renal measurements: 9.4 x 4.3 x 4.9 cm = volume: 103.7 mL. Atrophic right kidney with moderate right-sided hydronephrosis. Upper pole exophytic cyst measuring up to 2.7 cm. Left Kidney: Renal measurements: 11.3 x 6.4 x 5.8 cm = volume: 218.8 mL. No hydronephrosis. Bladder: Not visualized Other: None. IMPRESSION:  Atrophic right kidney with chronic moderate right hydronephrosis. No left-sided hydronephrosis. Electronically Signed   By: Maurine Simmering M.D.   On: 02/04/2022 18:26   DG CHEST PORT 1 VIEW  Result Date: 02/18/2022 CLINICAL DATA:  Shortness of breath. EXAM: PORTABLE CHEST 1 VIEW COMPARISON:  One-view chest x-ray 02/16/2022. FINDINGS: Left lower pneumothorax is stable to minimally increased in size following removal of the chest tube. Airspace densities along the margin of the pneumothorax stable. Interstitial edema in the right lung is slightly increased. Right IJ Port-A-Cath is stable. IMPRESSION: 1. Stable to minimally increased size of left lower pneumothorax following removal of chest tube. 2. Slight increase in interstitial edema in the right lung. Electronically Signed   By: San Morelle M.D.   On: 02/18/2022 08:35   DG Chest Port 1 View  Result Date: 02/16/2022 CLINICAL DATA:  Empyema lung EXAM: PORTABLE CHEST - 1 VIEW COMPARISON:  02/14/2022 FINDINGS: Pigtail chest tube stable at the lateral right lung base. Loculated left inferolateral pleural gas in the empyema cavity as before. Extensive airspace consolidation in the left mid and lower lung stable. Some worsening of diffuse a airspace opacities on the right predominantly perihilar. Stable right IJ port catheter. Heart size and mediastinal contours are within normal limits. Visualized bones unremarkable. Left nephrostomy tube and proximal aspect of ureteral stent noted. Metallic GI stent in the left upper abdomen partially visualized. Cholecystectomy clips. IMPRESSION: 1. Persistent loculated gas in the left empyema cavity, with stable chest drain. 2. worsening of  right perihilar airspace opacities. Electronically Signed   By: Lucrezia Europe M.D.   On: 02/16/2022 08:52   DG CHEST PORT 1 VIEW  Result Date: 02/14/2022 CLINICAL DATA:  Empyema EXAM: PORTABLE CHEST 1 VIEW COMPARISON:  Chest x-ray dated February 13, 2022 FINDINGS: Cardiac and  mediastinal contours are unchanged. Bilateral nodular opacities and heterogeneous opacities of left lung, unchanged compared to prior. Small left hydropneumothorax with slightly decreased size of air component. Small right pleural effusion.  IMPRESSION: Small left hydropneumothorax with slightly decreased size of air component. Left-sided chest tube in place. Electronically Signed   By: Yetta Glassman M.D.   On: 02/14/2022 12:10   DG CHEST PORT 1 VIEW  Result Date: 02/13/2022 CLINICAL DATA:  Empyema lung EXAM: PORTABLE CHEST 1 VIEW COMPARISON:  02/11/2022 FINDINGS: Left basilar chest tube. Right chest wall port with catheter tip unchanged. Bilateral pulmonary opacities with nodularity are again identified. Overall aeration is slightly worse. Increased lucency at the left costophrenic angle suggesting greater pleural air conjunction with residual effusion. Similar cardiomediastinal contours. IMPRESSION: Bilateral pulmonary opacities with nodularity. Aeration is slightly worse. Increased lucency at the left costophrenic angle likely represents greater pneumothorax component of a hydropneumothorax. Electronically Signed   By: Macy Mis M.D.   On: 02/13/2022 11:39   DG CHEST PORT 1 VIEW  Result Date: 02/11/2022 CLINICAL DATA:  Left thoracentesis EXAM: PORTABLE CHEST 1 VIEW COMPARISON:  Previous studies including the examination of 02/10/2022 FINDINGS: There is interval placement of pigtail left chest tube with its tip in the lateral aspect of left lower lung fields. There is interval decrease in amount of left pleural effusion. Still, there is moderate residual effusion on the left side. Evaluation of left lower lung fields for infiltrates is limited by the effusion. There are patchy densities in the right lower lung fields with interval improvement. There are scattered nodular densities in the visualized portions of lung fields suggesting pulmonary metastatic disease. There is no pneumothorax. IMPRESSION:  There is interval decrease in left pleural effusion after placement of left chest tube. Still, there is moderate residual left pleural effusion. There are scattered noncalcified nodules in both lungs suggesting pulmonary metastatic disease. Electronically Signed   By: Elmer Picker M.D.   On: 02/11/2022 20:06   DG Chest Port 1 View  Result Date: 02/09/2022 CLINICAL DATA:  Leukocytosis, pancreatic cancer EXAM: PORTABLE CHEST 1 VIEW COMPARISON:  02/04/2022 FINDINGS: Right-sided chest port remains in place. Enteric tube has been removed. Stable heart size. Multiple pulmonary nodules and masses throughout both lungs. Moderate sized left pleural effusion. Trace right pleural effusion. No pneumothorax. IMPRESSION: 1. Moderate left and trace right pleural effusions. 2. Multiple pulmonary nodules and masses throughout both lungs compatible with known metastatic disease. Electronically Signed   By: Davina Poke D.O.   On: 02/09/2022 12:33   DG CHEST PORT 1 VIEW  Result Date: 02/04/2022 CLINICAL DATA:  Cough.  History of pancreatic cancer. EXAM: PORTABLE CHEST 1 VIEW COMPARISON:  January 07, 2022. FINDINGS: Stable cardiomediastinal silhouette. Nasogastric tube is seen entering stomach. Right internal jugular Port-A-Cath is unchanged. Bilateral pulmonary nodules are noted. Stable probable loculated left pleural effusion is noted. Bony thorax is unremarkable. IMPRESSION: Stable bilateral pulmonary nodules consistent with metastatic disease. Stable loculated left pleural effusion. Electronically Signed   By: Marijo Conception M.D.   On: 02/04/2022 11:24   DG Abd Portable 1V  Result Date: 02/18/2022 CLINICAL DATA:  Abdominal distension. EXAM: PORTABLE ABDOMEN - 1 VIEW COMPARISON:  Abdominal CT 02/12/2022. FINDINGS: Distal duodenal stent is noted with persistent moderate small bowel dilatation in the mid and right abdomen. There is possible fecalization of the distal small bowel. The colon appears decompressed.  Scattered bowel anastomosis clips, cholecystectomy clips, percutaneous left nephrostomy tube and left ureteral stent remain in place. There are mild degenerative changes within the spine. IMPRESSION: Persistent moderate diffuse small bowel dilatation following distal duodenal stenting, similar to recent prior studies, and improved from 01/28/2022. Findings may reflect persistent  partial small-bowel obstruction versus ileus. Electronically Signed   By: Richardean Sale M.D.   On: 02/18/2022 11:42   DG Abd Portable 1V  Result Date: 02/07/2022 CLINICAL DATA:  NG tube placement EXAM: PORTABLE ABDOMEN - 1 VIEW COMPARISON:  Previous studies including the examination done earlier today FINDINGS: Tip enteric tube is noted at the gastroesophageal junction. There is cephalad migration of the tip of enteric tube since the previous examination. Side port in the enteric tube is noted in the lower thoracic esophagus. Bowel gas pattern is nonspecific. Surgical clips are seen in the right upper quadrant. Surgical staples are seen in the epigastrium. There is percutaneous left nephrostomy catheter in place. Left ureteral stent is seen. There is possible stent in a small bowel loop in the mid abdomen. There is homogeneous opacification with air bronchogram in the left lower lung fields suggesting atelectasis/pneumonia. There are faint nodular densities in the right lower lung fields suggesting pulmonary metastatic disease. IMPRESSION: Tip of enteric tube is at the gastroesophageal junction with side port in the lower thoracic esophagus. Other findings as described in the body of the report. Electronically Signed   By: Elmer Picker M.D.   On: 02/07/2022 15:43   DG Abd Portable 1V  Result Date: 02/07/2022 CLINICAL DATA:  Confirm nasogastric tube placement. EXAM: PORTABLE ABDOMEN - 1 VIEW COMPARISON:  02/07/2022 at 5 a.m. FINDINGS: Nasogastric tube is in place, tip overlying the level of the proximal stomach. Side port is  in the region of the gastroesophageal junction. Position is similar to the prior study. Percutaneous nephrostomy and ureteral catheter appear unchanged. Duodenal stent is unchanged. No evidence for free intraperitoneal air. There is mild dilatation of small bowel loops, only partially imaged. There continues to be dense opacity of the LEFT lung base and numerous rounded masses throughout the lungs. IMPRESSION: Nasogastric tube tip overlying the level of the proximal stomach, similar in position to study. Electronically Signed   By: Nolon Nations M.D.   On: 02/07/2022 11:40   DG Abd Portable 1V  Result Date: 02/07/2022 CLINICAL DATA:  A male at age 15 presents for bowel obstruction. EXAM: PORTABLE ABDOMEN - 1 VIEW COMPARISON:  February 06, 2022. FINDINGS: LEFT-sided nephrostomy tube and nephroureteral stent remain in place. Gastric tube has been advanced since the prior study, side port likely at the level of the GE junction. Small bowel distension is diminished compared to previous imaging though remains dilated mildly in the central abdomen, potentially as much as 4.5 cm in the lower abdomen. No substantial stool visualized in the colon. Duodenal stent remaining in place similar position. On limited assessment there is no acute skeletal process. IMPRESSION: 1. Gastric tube has been advanced since the prior study, side port likely at the level of the GE junction. Consider approximally 2 cm advancement for more optimal placement. 2. Continued small bowel distension particularly in lower abdomen though diminished compared to previous imaging. Electronically Signed   By: Zetta Bills M.D.   On: 02/07/2022 07:58   DG Abd Portable 1V  Result Date: 02/06/2022 CLINICAL DATA:  Small-bowel obstruction, resection 2/14, no bowel movement EXAM: PORTABLE ABDOMEN - 1 VIEW COMPARISON:  02/05/2022 FINDINGS: Similar appearance of gas and fluid filled, distended small bowel in the central abdomen, largest loops measuring  up to 5.0 cm in caliber. Esophagogastric tube remains with tip at the level of the gastroesophageal junction, side port in the lower esophagus. Bowel stent projects at the left aspect of the lumbar spine. Left-sided percutaneous  nephrostomy catheter and left-sided ureteral stent, partially imaged. No obvious free air in the abdomen on single supine radiograph. IMPRESSION: 1. Similar appearance of gas and fluid filled, distended small bowel in the central abdomen, largest loops measuring up to 5.0 cm in caliber. Findings are consistent with ileus or obstruction. 2. Esophagogastric tube remains with tip at the level of the gastroesophageal junction, side port in the lower esophagus. Electronically Signed   By: Delanna Ahmadi M.D.   On: 02/06/2022 10:07   ECHOCARDIOGRAM COMPLETE BUBBLE STUDY  Result Date: 02/14/2022    ECHOCARDIOGRAM REPORT   Patient Name:   Kevin Villegas Date of Exam: 02/14/2022 Medical Rec #:  [id number]      Height:       71.0 in Accession #:    [id number]     Weight:       153.9 lb Date of Birth:  [birth year]      BSA:          1.886 m Patient Age:    81 years       BP:           133/95 mmHg Patient Gender: M              HR:           80 bpm. Exam Location:  Inpatient Procedure: 2D Echo and Saline Contrast Bubble Study Indications:    Pleural effusion. Saline contrast study requested.  History:        Patient has no prior history of Echocardiogram examinations.  Sonographer:    Arlyss Gandy Referring Phys: [id number] [facility]  1. The aortic valve is calcified. There is severe calcifcation of the aortic valve. There is severe thickening of the aortic valve. Aortic valve regurgitation is not visualized. LVOT VTI signal suboptimal and therefore difficult to calculate AVA and DI.  Suspect severe vs moderate-to-severe aortic stenosis. Mean gradient 22mHg, Vmax 3.570m. AVA by rough estimate 0.7cm2, DI 0.22. Consider TEE vs CTA for further evaluation.  2. Left ventricular ejection  fraction, by estimation, is 55 to 60%. The left ventricle has normal function. The left ventricle has no regional wall motion abnormalities. There is moderate hypertrophy of the basal septum. The rest of the LV segments demonstrate mild left ventricular hypertrophy. Left ventricular diastolic parameters are consistent with Grade I diastolic dysfunction (impaired relaxation).  3. Right ventricular systolic function is normal. The right ventricular size is normal. There is normal pulmonary artery systolic pressure.  4. The mitral valve is normal in structure. Trivial mitral valve regurgitation.  5. Aortic dilatation noted. There is moderate dilatation of the ascending aorta, measuring 45 mm.  6. The inferior vena cava is normal in size with greater than 50% respiratory variability, suggesting right atrial pressure of 3 mmHg. Comparison(s): No prior Echocardiogram. FINDINGS  Left Ventricle: Left ventricular ejection fraction, by estimation, is 55 to 60%. The left ventricle has normal function. The left ventricle has no regional wall motion abnormalities. The left ventricular internal cavity size was normal in size. There is  moderate hypertrophy of the basal septum. The rest of the LV segments demonstrate mild left ventricular hypertrophy. Left ventricular diastolic parameters are consistent with Grade I diastolic dysfunction (impaired relaxation). Right Ventricle: The right ventricular size is normal. No increase in right ventricular wall thickness. Right ventricular systolic function is normal. There is normal pulmonary artery systolic pressure. The tricuspid regurgitant velocity is 2.81 m/s, and  with an assumed right atrial pressure of 3 mmHg,  the estimated right ventricular systolic pressure is 57.2 mmHg. Left Atrium: Left atrial size was normal in size. Right Atrium: Right atrial size was normal in size. Pericardium: There is no evidence of pericardial effusion. Mitral Valve: The mitral valve is normal in  structure. Mild mitral annular calcification. Trivial mitral valve regurgitation. Tricuspid Valve: The tricuspid valve is normal in structure. Tricuspid valve regurgitation is trivial. Aortic Valve: The aortic valve is calcified. There is severe calcifcation of the aortic valve. There is severe thickening of the aortic valve. Aortic valve regurgitation is not visualized. Moderate to severe aortic stenosis is present. Aortic valve mean gradient measures 17.8 mmHg. Aortic valve peak gradient measures 32.6 mmHg. Aortic valve area, by VTI measures 0.97 cm. Pulmonic Valve: The pulmonic valve was not well visualized. Pulmonic valve regurgitation is trivial. Aorta: Aortic dilatation noted. There is moderate dilatation of the ascending aorta, measuring 45 mm. Venous: The inferior vena cava is normal in size with greater than 50% respiratory variability, suggesting right atrial pressure of 3 mmHg. IAS/Shunts: The atrial septum is grossly normal. Agitated saline contrast was given intravenously to evaluate for intracardiac shunting.  LEFT VENTRICLE PLAX 2D LVIDd:         3.40 cm   Diastology LVIDs:         2.40 cm   LV e' medial:    5.11 cm/s LV PW:         1.30 cm   LV E/e' medial:  12.6 LV IVS:        1.50 cm   LV e' lateral:   5.22 cm/s LVOT diam:     2.40 cm   LV E/e' lateral: 12.3 LV SV:         49 LV SV Index:   26 LVOT Area:     4.52 cm  RIGHT VENTRICLE             IVC RV Basal diam:  3.40 cm     IVC diam: 1.20 cm RV Mid diam:    2.90 cm RV S prime:     15.40 cm/s TAPSE (M-mode): 1.9 cm LEFT ATRIUM           Index        RIGHT ATRIUM           Index LA diam:      2.00 cm 1.06 cm/m   RA Area:     10.40 cm LA Vol (A2C): 16.1 ml 8.54 ml/m   RA Volume:   23.70 ml  12.56 ml/m LA Vol (A4C): 24.0 ml 12.72 ml/m  AORTIC VALVE AV Area (Vmax):    0.93 cm AV Area (Vmean):   0.98 cm AV Area (VTI):     0.97 cm AV Vmax:           285.50 cm/s AV Vmean:          187.250 cm/s AV VTI:            0.507 m AV Peak Grad:      32.6  mmHg AV Mean Grad:      17.8 mmHg LVOT Vmax:         58.80 cm/s LVOT Vmean:        40.400 cm/s LVOT VTI:          0.109 m LVOT/AV VTI ratio: 0.21  AORTA Ao Root diam: 3.50 cm Ao Asc diam:  4.50 cm MITRAL VALVE  TRICUSPID VALVE MV Area (PHT): 2.36 cm    TR Peak grad:   31.6 mmHg MV Decel Time: 322 msec    TR Vmax:        281.00 cm/s MV E velocity: 64.30 cm/s MV A velocity: 80.80 cm/s  SHUNTS MV E/A ratio:  0.80        Systemic VTI:  0.11 m                            Systemic Diam: 2.40 cm Gwyndolyn Kaufman MD Electronically signed by Gwyndolyn Kaufman MD Signature Date/Time: 02/14/2022/5:22:27 PM    Final    VAS [place] LOWER EXTREMITY VENOUS (DVT)  Result Date: 02/14/2022  Lower Venous DVT Study Patient Name:  Kevin Villegas  Date of Exam:   02/14/2022 Medical Rec #: [id number]       Accession #:    [id number] Date of Birth: Dec 22, 1940       Patient Gender: M Patient Age:   27 years Exam Location:  [facility] Procedure:      VAS [place] LOWER EXTREMITY VENOUS (DVT) Referring Phys: [place] --------------------------------------------------------------------------------  Indications: Edema.  Comparison Study: No prior study Performing Technologist: Maudry Mayhew MHA, RDMS, RVT, RDCS  Examination Guidelines: A complete evaluation includes B-mode imaging, spectral Doppler, color Doppler, and power Doppler as needed of all accessible portions of each vessel. Bilateral testing is considered an integral part of a complete examination. Limited examinations for reoccurring indications may be performed as noted. The reflux portion of the exam is performed with the patient in reverse Trendelenburg.  +-----+---------------+---------+-----------+----------+--------------+  RIGHT Compressibility Phasicity Spontaneity Properties Thrombus Aging  +-----+---------------+---------+-----------+----------+--------------+  CFV   Full            Yes       Yes                                     +-----+---------------+---------+-----------+----------+--------------+   +---------+---------------+---------+-----------+----------+-----------------+  LEFT      Compressibility Phasicity Spontaneity Properties Thrombus Aging     +---------+---------------+---------+-----------+----------+-----------------+  CFV       Full            No        Yes                                       +---------+---------------+---------+-----------+----------+-----------------+  SFJ       Full                                                                +---------+---------------+---------+-----------+----------+-----------------+  FV Prox   Full                                                                +---------+---------------+---------+-----------+----------+-----------------+  FV Mid    None  No                     Acute              +---------+---------------+---------+-----------+----------+-----------------+  FV Distal None                      No                     Acute              +---------+---------------+---------+-----------+----------+-----------------+  PFV       Full                                                                +---------+---------------+---------+-----------+----------+-----------------+  POP       Full            No        Yes                                       +---------+---------------+---------+-----------+----------+-----------------+  PTV       None            Yes       Yes                    Age Indeterminate  +---------+---------------+---------+-----------+----------+-----------------+  PERO      None                      No                     Acute              +---------+---------------+---------+-----------+----------+-----------------+    Summary: RIGHT: - No evidence of common femoral vein obstruction.  LEFT: - Findings consistent with acute deep vein thrombosis involving the left femoral vein, and left peroneal veins. - Findings consistent with  age indeterminate deep vein thrombosis involving the left peroneal veins.  *See table(s) above for measurements and observations. Electronically signed by Servando Snare MD on 02/14/2022 at 10:42:12 AM.    Final    US THORACENTESIS ASP PLEURAL SPACE W/IMG GUIDE  Result Date: 02/10/2022 INDICATION: Patient with metastatic pancreatic cancer presents today with left pleural effusion. Interventional radiology asked to perform a diagnostic and therapeutic thoracentesis. EXAM: ULTRASOUND GUIDED THORACENTESIS MEDICATIONS: 1% lidocaine 10 mL COMPLICATIONS: None immediate. PROCEDURE: An ultrasound guided thoracentesis was thoroughly discussed with the patient and questions answered. The benefits, risks, alternatives and complications were also discussed. The patient understands and wishes to proceed with the procedure. Written consent was obtained. Ultrasound was performed to localize and mark an adequate pocket of fluid in the left chest. Loculated collection identified. The area was then prepped and draped in the normal sterile fashion. 1% Lidocaine was used for local anesthesia. Under ultrasound guidance a 6 Fr Safe-T-Centesis catheter was introduced. Thoracentesis was performed. The catheter was removed and a dressing applied. FINDINGS: A total of approximately 50 mL of light red/thick/purulent fluid was removed. Samples were sent to the laboratory as requested by the clinical team. IMPRESSION: Successful ultrasound guided left thoracentesis yielding  50 mL of pleural fluid. Read by: Soyla Dryer, NP Electronically Signed   By: Aletta Edouard M.D.   On: 02/10/2022 14:38    Microbiology: Results for orders placed or performed during the hospital encounter of 01/28/22  Urine Culture     Status: Abnormal   Collection Time: 01/28/22  3:00 PM   Specimen: Partial Nephrectomy Left; Urine  Result Value Ref Range Status   Specimen Description   Final    URINE, RANDOM Performed at [facility], [address] [place], [place] [address]    Special Requests   Final    NONE Performed at [facility], [address] [place], [place] [address]    Culture >=100,000 COLONIES/mL ESCHERICHIA COLI (A)  Final   Report Status 01/31/2022 FINAL  Final   Organism ID, Bacteria ESCHERICHIA COLI (A)  Final      Susceptibility   Escherichia coli - MIC*    AMPICILLIN 8 SENSITIVE Sensitive     CEFAZOLIN <=4 SENSITIVE Sensitive     CEFEPIME <=0.12 SENSITIVE Sensitive     CEFTRIAXONE <=0.25 SENSITIVE Sensitive     CIPROFLOXACIN <=0.25 SENSITIVE Sensitive     GENTAMICIN <=1 SENSITIVE Sensitive     IMIPENEM <=0.25 SENSITIVE Sensitive     NITROFURANTOIN <=16 SENSITIVE Sensitive     TRIMETH/SULFA <=20 SENSITIVE Sensitive     AMPICILLIN/SULBACTAM 4 SENSITIVE Sensitive     PIP/TAZO <=4 SENSITIVE Sensitive     * >=100,000 COLONIES/mL ESCHERICHIA COLI  Culture, blood (routine x 2)     Status: None   Collection Time: 02/04/22  3:42 PM   Specimen: BLOOD  Result Value Ref Range Status   Specimen Description BLOOD LEFT ANTECUBITAL  Final   Special Requests   Final    BOTTLES DRAWN AEROBIC AND ANAEROBIC Blood Culture adequate volume   Culture   Final    NO GROWTH 5 DAYS Performed at [facility], [place] [address] [place], [place] [address]    Report Status 02/09/2022 FINAL  Final  Culture, blood (routine x 2)     Status: None   Collection Time: 02/04/22  3:42 PM   Specimen: BLOOD  Result Value Ref Range Status   Specimen Description BLOOD RIGHT ANTECUBITAL  Final   Special Requests   Final    BOTTLES DRAWN AEROBIC ONLY Blood Culture results may not be optimal due to an inadequate volume of blood received in culture bottles   Culture   Final    NO GROWTH 5 DAYS Performed at [facility], [place] [address] [place], [place] [address]    Report Status 02/09/2022 FINAL  Final  Body fluid culture w Gram Stain     Status: None (Preliminary result)   Collection Time: 02/10/22  1:37 PM    Specimen: PATH Cytology Pleural fluid  Result Value Ref Range Status   Specimen Description   Final    PLEURAL Performed at [place] [address] [place], [place] [address]    Special Requests   Final    NONE Performed at [facility], [place] [address], [place], [place] [address]    Gram Stain FEW WBC SEEN NO ORGANISMS SEEN   Final   Culture   Final    MODERATE ENTEROCOCCUS FAECIUM VANCOMYCIN RESISTANT ENTEROCOCCUS CRITICAL RESULT CALLED TO, READ BACK BY AND VERIFIED WITH: RN LISA.P AT 1144 ON 02/11/2022 BY T.SAAD. Sent to [place] for further susceptibility testing. Performed  at [facility], [place] [address] [place], [place] [address]    Report Status PENDING  Incomplete   Organism ID, Bacteria ENTEROCOCCUS FAECIUM  Final      Susceptibility   Enterococcus faecium - MIC*    AMPICILLIN >=32 RESISTANT Resistant     VANCOMYCIN >=32 RESISTANT Resistant     GENTAMICIN SYNERGY SENSITIVE Sensitive     LINEZOLID 2 SENSITIVE Sensitive     * MODERATE ENTEROCOCCUS FAECIUM    Labs: CBC: Recent Labs  Lab 02/14/22 0713 02/15/22 0357 02/16/22 0358 02/17/22 0429 02/18/22 0358  WBC 18.4* 20.2* 21.0* 23.4* 22.0*  NEUTROABS 15.1* 15.3* 15.7* 17.6* 16.6*  HGB 10.0* 10.3* 9.3* 9.5* 9.8*  HCT 32.3* 33.2* 29.7* 31.3* 31.7*  MCV 97.0 96.0 96.1 98.4 98.4  PLT 1,049* 1,066* 1,053* 1,020* 4,765*   Basic Metabolic Panel: Recent Labs  Lab 02/14/22 0713 02/15/22 0357 02/16/22 0358 02/17/22 0429 02/19/22 0354  NA 139 141 140 137 132*  K 4.0 4.3 4.1 4.0 4.0  CL 109 107 107 103 99  CO2 '26 28 27 28 26  '$ GLUCOSE 160* 150* 132* 119* 130*  BUN 46* 48* 46* 46* 46*  CREATININE 1.15 1.25* 1.02 1.26* 1.20  CALCIUM 7.9* 8.0* 7.8* 7.8* 7.9*  MG 2.0 2.1  --  2.1 2.0  PHOS 3.6 3.5  --  3.6 3.5   Liver Function Tests: Recent Labs  Lab 02/14/22 0713 02/15/22 0357 02/16/22 0358 02/17/22 0429  AST 70* 60* 45* 35  ALT 60* 53* 40 32  ALKPHOS  210* 228* 191* 177*  BILITOT 0.3 0.5 0.1* 0.3  PROT 5.3* 5.7* 5.5* 5.6*  ALBUMIN <1.5* 1.6* <1.5* 1.5*   CBG: Recent Labs  Lab 02/19/22 1758 02/20/22 0003 02/20/22 0603 02/20/22 0647 02/20/22 0831  GLUCAP 71 94 61* 89 76    Discharge time spent: greater than 30 minutes.  Signed: Nita Sells, MD [facility] 02/20/2022

## 2022-02-20 NOTE — Plan of Care (Signed)
  Problem: Pain Managment: Goal: General experience of comfort will improve Outcome: Progressing   Problem: Safety: Goal: Ability to remain free from injury will improve Outcome: Progressing   

## 2022-02-20 NOTE — Progress Notes (Signed)
?   02/20/22 1700  ?What Happened  ?Was fall witnessed? No  ?Was patient injured? Unsure  ?Patient found on floor  ?Found by Staff-comment ?Regulatory affairs officer)  ?Stated prior activity ambulating-unassisted  ?Follow Up  ?MD notified Dr. Verlon Au  ?Time MD notified 1650  ?Family notified Yes - comment ?(wife- Butch Penny (notified by RN-Sakara))  ?Time family notified 1716  ?Additional tests No  ?Progress note created (see row info) Yes  ?Adult Fall Risk Assessment  ?Risk Factor Category (scoring not indicated) Fall has occurred during this admission (document High fall risk)  ?Patient Fall Risk Level High fall risk  ?Adult Fall Risk Interventions  ?Required Bundle Interventions *See Row Information* High fall risk - low, moderate, and high requirements implemented  ?Screening for Fall Injury Risk (To be completed on HIGH fall risk patients) - Assessing Need for Floor Mats  ?Risk For Fall Injury- Criteria for Floor Mats Previous fall this admission;Confusion/dementia (+NuDESC, CIWA, TBI, etc.)  ?Will Implement Floor Mats Yes ?(floor mats previously at bedside)  ? ? ?

## 2022-02-20 NOTE — Progress Notes (Signed)
This RN heard pt calling "help" when passing by, found pt flat on his back on the floor. Upon assessment pt was alert and responsive, said he hit his head, no obvious injuries seen. Assigned RN, physician and CT notified. ?

## 2022-02-21 ENCOUNTER — Telehealth: Payer: Self-pay | Admitting: Family Medicine

## 2022-02-21 ENCOUNTER — Encounter (HOSPITAL_COMMUNITY)
Admission: EM | Disposition: A | Discharge status: Hospice | Payer: Self-pay | Source: Home / Self Care | Attending: Internal Medicine

## 2022-02-21 ENCOUNTER — Other Ambulatory Visit (HOSPITAL_COMMUNITY): Payer: Medicare Other

## 2022-02-21 LAB — GLUCOSE, CAPILLARY: Glucose-Capillary: 71 mg/dL (ref 70–99)

## 2022-02-21 SURGERY — ECHOCARDIOGRAM, TRANSESOPHAGEAL
Anesthesia: Monitor Anesthesia Care

## 2022-02-21 NOTE — Progress Notes (Signed)
? ?  Referral received from [facility] in [facility] Reviewed the chart and discussed with our MD at hospice home in [facility]. The pt was approved for hospice services at our facility. I reached out to discuss this option with the wife and she immediately replied "No, I need hm closer to home" and thanked me for my call.  ? ?Please reach back out if there is anything else we can assist with at this time. Webb Silversmith RN [phone]  ?

## 2022-02-21 NOTE — Progress Notes (Signed)
Manufacturing engineer [facility]) Hospital Liaison note.    ? ?Received request from [place] for family interest in [facility]. Spoke with patient's wife who declined [employer] [facility] [employer] due to distance from home. She states she needs home closer to her home in [place]. ? ?A Please do not hesitate to call with questions.    ? ?Thank you,    ?Farrel Gordon, RN, CCM       ?[place] (listed on AMION under [place])     ?[phone] ?

## 2022-02-21 NOTE — TOC Progression Note (Signed)
Transition of Care (TOC) - Progression Note  ? ? ?Patient Details  ?Name: Kevin Villegas ?MRN: [id number] ?Date of Birth: 23-Mar-1941 ? ?Transition of Care (TOC) CM/SW Contact  ?Purcell Mouton, RN ?Phone Number: ?02/21/2022, 3:54 PM ? ?Clinical Narrative:    ? ?Spoke with pt's wife concerning other Residential Hospice Homes.  Wife states, I am in the middle of something. I Do not want him to come home, because I am old and can't take care of him. [place] and everywhere else is to far away. I need him closer to home. [name] declined [facility].  ? ?Expected Discharge Plan: [place] ?Barriers to Discharge: Continued Medical Work up ? ?Expected Discharge Plan and Services ?Expected Discharge Plan: [place] ?  ?Discharge Planning Services: CM Consult ?  ?  ?Expected Discharge Date: 02/21/22               ?  ?  ?  ?  ?  ?  ?  ?  ?  ?  ? ? ?Social Determinants of Health (SDOH) Interventions ?  ? ?Readmission Risk Interventions ?Readmission Risk Prevention Plan 02/03/2022 06/16/2021 06/11/2021  ?Transportation Screening Complete Complete Complete  ?PCP or Specialist Appt within 3-5 Days - - Complete  ?[place] or Home Care Consult - - Complete  ?Social Work Consult for [place] Planning/Counseling - - Complete  ?Palliative Care Screening - - Not Applicable  ?Medication Review Press photographer) Complete Complete Complete  ?PCP or Specialist appointment within 3-5 days of discharge Complete Complete -  ?[place] or Home Care Consult Complete Complete -  ?SW Recovery Care/Counseling Consult Complete Complete -  ?Palliative Care Screening Not Applicable Not Applicable -  ?[place] Not Applicable Not Applicable -  ?Some recent data might be hidden  ? ? ?

## 2022-02-21 NOTE — TOC Progression Note (Signed)
Transition of Care (TOC) - Progression Note  ? ? ?Patient Details  ?Name: Kevin Villegas ?MRN: [id number] ?Date of Birth: 10-Jan-1941 ? ?Transition of Care (TOC) CM/SW Contact  ?Purcell Mouton, RN ?Phone Number: ?02/21/2022, 9:09 AM ? ?Clinical Narrative:    ?A call to [facility], was made, there are no beds at present time.  ? ? ?Expected Discharge Plan: [place] ?Barriers to Discharge: Continued Medical Work up ? ?Expected Discharge Plan and Services ?Expected Discharge Plan: [place] ?  ?Discharge Planning Services: CM Consult ?  ?  ?Expected Discharge Date: 02/21/22               ?  ?  ?  ?  ?  ?  ?  ?  ?  ?  ? ? ?Social Determinants of Health (SDOH) Interventions ?  ? ?Readmission Risk Interventions ?Readmission Risk Prevention Plan 02/03/2022 06/16/2021 06/11/2021  ?Transportation Screening Complete Complete Complete  ?PCP or Specialist Appt within 3-5 Days - - Complete  ?[place] or Home Care Consult - - Complete  ?Social Work Consult for [place] Planning/Counseling - - Complete  ?Palliative Care Screening - - Not Applicable  ?Medication Review Press photographer) Complete Complete Complete  ?PCP or Specialist appointment within 3-5 days of discharge Complete Complete -  ?[place] or Home Care Consult Complete Complete -  ?SW Recovery Care/Counseling Consult Complete Complete -  ?Palliative Care Screening Not Applicable Not Applicable -  ?[place] Not Applicable Not Applicable -  ?Some recent data might be hidden  ? ? ?

## 2022-02-21 NOTE — Progress Notes (Signed)
Patient immensely agitated and argumentative with staff. Patient did not want staff to check his blood sugar and refused to allow staff to. Medication administered per MAR. ?

## 2022-02-21 NOTE — Telephone Encounter (Signed)
Patient declined the Medicare Wellness Visit with [facility]  Paitent's wife answered.  ?Her husband is being placed in Hospice per her conversation.  ?

## 2022-02-21 NOTE — TOC Progression Note (Signed)
Transition of Care (TOC) - Progression Note  ? ? ?Patient Details  ?Name: Kevin Villegas ?MRN: [id number] ?Date of Birth: [birth year] ? ?Transition of Care (TOC) CM/SW Contact  ?Purcell Mouton, RN ?Phone Number: ?02/21/2022, 1:33 PM ? ?Clinical Narrative:    ?No Bed available at present time. Pt was sent to two other [facility] rep to review. Checked again with [facility].   ? ? ?Expected Discharge Plan: [place] ?Barriers to Discharge: Continued Medical Work up ? ?Expected Discharge Plan and Services ?Expected Discharge Plan: [place] ?  ?Discharge Planning Services: CM Consult ?  ?  ?Expected Discharge Date: 02/21/22               ?  ?  ?  ?  ?  ?  ?  ?  ?  ?  ? ? ?Social Determinants of Health (SDOH) Interventions ?  ? ?Readmission Risk Interventions ?Readmission Risk Prevention Plan 02/03/2022 06/16/2021 06/11/2021  ?Transportation Screening Complete Complete Complete  ?PCP or Specialist Appt within 3-5 Days - - Complete  ?[place] or Home Care Consult - - Complete  ?Social Work Consult for [place] Planning/Counseling - - Complete  ?Palliative Care Screening - - Not Applicable  ?Medication Review Press photographer) Complete Complete Complete  ?PCP or Specialist appointment within 3-5 days of discharge Complete Complete -  ?[place] or Home Care Consult Complete Complete -  ?SW Recovery Care/Counseling Consult Complete Complete -  ?Palliative Care Screening Not Applicable Not Applicable -  ?[place] Not Applicable Not Applicable -  ?Some recent data might be hidden  ? ? ?

## 2022-02-21 NOTE — Progress Notes (Signed)
Events from over the weekend noted.  Patient is full comfort care.  We will be available as needed, but nothing much else to add at this point. ? ?Kevin Villegas ?7:20 AM ?02/21/2022 ? ?

## 2022-02-21 NOTE — Progress Notes (Signed)
Nutrition Brief Note ? ?Patient last seen for full follow-up on 3/2 and brief note for Calorie Count day #1 results on 3/3. Chart reviewed. Patient has transitioned to comfort care and plan is for discharge to residential hospice once a bed is available.  ? ?Patient is now NPO and TPN has been stopped.  ? ?No further nutrition interventions planned at this time. Please re-consult as needed.  ? ? ? ? ?Jarome Matin, MS, RD, LDN ?Inpatient Clinical Dietitian ?RD pager # available in [place]  ?After hours/weekend pager # available in [place] ? ? ?

## 2022-02-21 NOTE — Discharge Summary (Signed)
Physician Discharge Summary   Patient: Kevin Villegas MRN: [id number] DOB: May 06, 1941  Admit date:     01/28/2022  Discharge date: 02/21/22  Discharge Physician: Nita Sells  Patient seen examined--needs to go to Hospice house today PCP: Dettinger, [name], MD   Recommendations at discharge:  Discharging to free-standing Hospice a.m. 02/21/2022  Discharge Diagnoses: Principal Problem:   Partial small bowel obstruction ([place]) Active Problems:   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)M1  with metastatic disease   Leukocytosis   Protein-calorie malnutrition, severe   Hydronephrosis of left kidney   Essential hypertension   Colostomy in place [facility])   Stage 3a chronic kidney disease (CKD) ([place])   E-coli UTI   Goals of care, counseling/discussion   Empyema of left pleural space ([place])   Empyema lung ([place])   Leg DVT (deep venous thromboembolism), acute, left (HCC)   Pressure injury of skin   Thrombocytosis   Acute metabolic encephalopathy  Resolved Problems:   * No resolved hospital problems. *   Hospital Course:  Brief Narrative/Hospital Course: 5 m stage IV pancreatic cancer status post colectomy,  hydronephrosis status post left nephrostomy 10/2021 [last change 01/17/22] hypertension,CKD stage III    P SBO on admit 1/20 -->1/23 Re-admit 2/11 ,  2/14 status post diagnostic laparoscopy with lysis of adhesion  complication coffee-ground colored secretion from NG tube, leukocytosis. -Repeat CT scan 2/20 no bowel obstruction, no abscess moderate amount of free fluid in the right upper quadrant, body wall edema noted to the right anterior wall. With persistent leukocytosis but afebrile, being managed with TPN, IV antibiotics-.   2/17-2/22.  completed IV Zosyn  2/21 CTchest no intra-abdominal abscess or obstruction -enlarging left pleural effusion.   ID consulted, underwent left pleural diagnostic tap with 50 mL appears exudative fluid but Gram stain negative, total  nucleated cells 25,000, neutrophils100 2/24:placed on Zyvox and underwent small bore chest tube placement by PCCM, lytics done 2/25. 2/26-chest tube placed to suction. 2/27 lower leg edema noted more on left-left duplex ordered came back positive for DVT and placed on heparin drip 3/2 Chest tube out  3/3 imaging suggestive of SBO which is likely malignancy related-we conferred with palliative care-goals of care discussion planned 3/4 Long discussion with palliative care-patient is deciding on hospice of [place]   Subjective:   Tired  Agitation overnight No other concerns----keeps eyes closed for most of interview  Assessment and Plan:     Partial small bowel obstruction stage IV pancreatic cancer continued intra-abdominal abscesses Empyema left pleural space Resolved metabolic encephalopathy Acute left lower extremity DVT this admission Treated E. coli UTI with chronic hydronephrosis CKD stage IIIa Severe protein energy malnutrition Sacral decubiti   Patient has now been transitioned to full comfort measures-I had a detailed discussion with the patient and family at the bedside with Dr. Rowe Pavy of palliative medicine on 02/19/2022 Patient sustained a fall on 3/5 however he is stable at this time  with no untoward effect.  He began becoming a little bit more confused on 3/5 and delirious probably secondary to medications for comfort-I have alerted nursing to adjust medications as per palliative care  His prognosis is poor- less than 2 weeks to live as did de-escalate completely off of antibiotics on discharge         Consultants: Multiple-palliative care General surgery GI radiology Procedures performed: Multiple Disposition:  Hospice facility Diet recommendation:  Discharge Diet Orders (From admission, onward)     Start     Ordered  02/20/22 0000  Diet - low sodium heart healthy        02/20/22 1708           Comfort feeds as he is able to tolerate  DISCHARGE  MEDICATION: Allergies as of 02/21/2022       Reactions   Pollen Extract Other (See Comments)   Sinus congestion        Medication List     STOP taking these medications    mirtazapine 15 MG tablet Commonly known as: REMERON   multivitamin with minerals Tabs tablet   polyethylene glycol 17 g packet Commonly known as: MIRALAX / GLYCOLAX         Discharge Exam: Filed Weights   02/17/22 0500 02/18/22 0500 02/19/22 0500  Weight: 70.6 kg 70 kg 70.6 kg   Tired appearing white male awake but incoherent-not making much sense Continues to complain about "Joe Biden" for some reason-unclear if he knows time date or year and continues to ramble-even chest rise no rales no rhonchi S1-S2 no murmur Abdomen distended no rebound no guarding I did not examine ostomy  Condition at discharge: worsening  The results of significant diagnostics from this hospitalization (including imaging, microbiology, ancillary and laboratory) are listed below for reference.   Imaging Studies: CT ABDOMEN PELVIS WO CONTRAST  Result Date: 02/12/2022 CLINICAL DATA:  Abdominal pain, pancreatic carcinoma EXAM: CT ABDOMEN AND PELVIS WITHOUT CONTRAST TECHNIQUE: Multidetector CT imaging of the abdomen and pelvis was performed following the standard protocol without IV contrast. RADIATION DOSE REDUCTION: This exam was performed according to the departmental dose-optimization program which includes automated exposure control, adjustment of the mA and/or kV according to patient size and/or use of iterative reconstruction technique. COMPARISON:  02/07/2022 FINDINGS: Lower chest: There is interval placement of left chest tube with a decrease in the amount of left pleural effusion. There is residual moderate bilateral pleural effusions. Atelectatic changes are noted in the lower lung fields, more so on the left side. There are multiple noncalcified nodules of varying sizes in both lower lung fields suggesting pulmonary  metastatic disease. Hepatobiliary: There are few low-density lesions in the liver largest measuring 2.3 cm in the right lobe. There is no significant interval change. Density measurements suggest possible cysts. Surgical clips are seen in gallbladder fossa. Pancreas: No focal abnormality is seen. Spleen: Not seen Adrenals/Urinary Tract: There is mild nodularity in the adrenals. There is marked right hydronephrosis. Right ureter is dilated. There is percutaneous left nephrostomy tube ureteral stent. There is no hydronephrosis in the left kidney. There is 3.1 cm cyst in the upper pole of right kidney. Right kidney is smaller than left with cortical atrophy. Stomach/Bowel: Stomach is not distended. There is stent in the distal duodenum. There is dilation of small-bowel loops measuring up to 5.5 cm in maximum diameter. Appendix is not distinctly seen. Scattered diverticula are seen in colon without signs of focal diverticulitis. There is liquid stool in the rectum. Colostomy is seen in left paramedian location slightly above the level of umbilicus. Vascular/Lymphatic: Scattered arterial calcifications are seen. Reproductive: Unremarkable. Other: There is diffuse edema in subcutaneous plane suggesting anasarca. There is 2.7 cm soft tissue nodule in the right anterior abdominal wall with no significant change. There is no pneumoperitoneum. Small to moderate ascites is present. Musculoskeletal: Degenerative changes are noted in the lumbar spine with spinal stenosis and encroachment of neural foramina at multiple levels. IMPRESSION: There is decrease in left pleural effusion after placement of left chest tube. Moderate bilateral  pleural effusions are present. There are multiple noncalcified nodules of varying sizes in the lower lung fields consistent with pulmonary metastatic disease. There is dilation of small-bowel loops with interval worsening suggesting ileus or partial obstruction. Diverticulosis of colon without signs  of diverticulitis. Severe chronic dilation of collecting system and ureter on the right side with no significant change. There is percutaneous nephrostomy and ureteral stent on the left side. Small to moderate ascites. Other findings as described in the body of the report. Electronically Signed   By: Elmer Picker M.D.   On: 02/12/2022 14:01   CT ABDOMEN PELVIS WO CONTRAST  Result Date: 02/08/2022 CLINICAL DATA:  Abdominal pain. Postop, assess for intra-abdominal abscess. Patient is post small bowel resection and excisional biopsy of mesenteric mass on 02/01/2022 EXAM: CT ABDOMEN AND PELVIS WITHOUT CONTRAST TECHNIQUE: Multidetector CT imaging of the abdomen and pelvis was performed following the standard protocol without IV contrast. Patient declined IV contrast administration. RADIATION DOSE REDUCTION: This exam was performed according to the departmental dose-optimization program which includes automated exposure control, adjustment of the mA and/or kV according to patient size and/or use of iterative reconstruction technique. COMPARISON:  Preoperative CT 01/28/2022 FINDINGS: Lower chest: Pulmonary metastatic disease in the lung bases with multiple pulmonary nodules. Enlarging left pleural effusion with pleural thickening, possibly metastatic. Increasing compressive atelectasis in the included left lung base. Moderate-sized right pleural effusion is new. Hepatobiliary: Scattered water density lesions in the liver are unchanged from prior exam likely cysts cholecystectomy. Common bile duct is difficult defined, there is no visualized biliary dilatation. Pancreas: The pancreas is difficult to delineate on the current exam, electronic records states distal pancreatectomy. Spleen: Surgically absent. Adrenals/Urinary Tract: No adrenal nodule. There is right hydroureteronephrosis with the ureter being dilated to the pelvis, the previous hyperattenuating focus in the distal right ureter tentatively visualized  series 2, image 75. Marked right renal atrophy. Left nephrostomy and left nephroureteral stent in place. There is no left hydronephrosis. The urinary bladder is poorly defined, appears thick walled. There is air in the bladder. Stomach/Bowel: Detailed bowel assessment is limited given paucity of intra-abdominal fat and motion. Enteric tube terminates in the gastroesophageal junction. The stomach is decompressed. A duodenal stent is unchanged in position, patent. Descending colostomy with Hartmann's pouch. Small bowel is mildly prominent but nonobstructed, and enteric contrast is seen throughout the colon. Bowel loops in the pelvis are difficult to define, there is fluid and stool within the and set to sigmoid colon. Vascular/Lymphatic: Aortic atherosclerosis without aneurysm. Limited assessment for adenopathy given lack of IV contrast and motion artifact. Reproductive: Prostate is unremarkable. Other: There is a small amount of non organized free fluid in the pelvis. Moderate right upper quadrant ascites. Generalized edema of the intra-abdominal and mesenteric fat. There is no evidence of focal fluid collection/abscess. No free air. Diffuse subcutaneous edema. Soft tissue nodule in the right anterior abdominal wall. Musculoskeletal: No focal bone lesion. Degenerative change in the spine and hips. IMPRESSION: 1. No evidence of postoperative intra-abdominal abscess. Moderate right upper quadrant free fluid and small amount of free fluid in the pelvis is non organized. 2. Enteric tube terminates at the gastroesophageal junction. 3. Left nephrostomy and left nephroureteral stent in place without left hydronephrosis. Chronic right renal atrophy and hydroureteronephrosis. 4. Pulmonary metastatic disease in the lung bases with multiple pulmonary nodules. Enlarging left pleural effusion with pleural thickening, possibly metastatic. Moderate-sized right pleural effusion is new. 5. Diffuse body wall edema. 6. Soft tissue  nodule in the  right anterior abdominal wall is unchanged, possibly metastatic. 7. Additional chronic findings are unchanged. Aortic Atherosclerosis (ICD10-I70.0). Electronically Signed   By: Keith Rake M.D.   On: 02/08/2022 00:13   CT ABDOMEN PELVIS WO CONTRAST  Result Date: 01/28/2022 CLINICAL DATA:  Abdominal pain. Suspicion of bowel obstruction. History of colostomy EXAM: CT ABDOMEN AND PELVIS WITHOUT CONTRAST TECHNIQUE: Multidetector CT imaging of the abdomen and pelvis was performed following the standard protocol without IV contrast. RADIATION DOSE REDUCTION: This exam was performed according to the departmental dose-optimization program which includes automated exposure control, adjustment of the mA and/or kV according to patient size and/or use of iterative reconstruction technique. COMPARISON:  01/07/2022 FINDINGS: Lower chest: Bilateral pulmonary nodules consistent with metastasis. Index right lower lobe 2.2 cm nodule on 12/04 is similar to on the prior. Lingular nodule measures 1.2 cm on 12/04. Loculated left-sided pleural effusion with pleural thickening, similar. Hepatobiliary: Hepatic cysts. Cholecystectomy, without biliary ductal dilatation. Pancreas: Per report partial pancreatectomy. Pancreas poorly evaluated secondary to technique and paucity of retroperitoneal fat. Spleen: Surgically absent Adrenals/Urinary Tract: Normal adrenal glands. Left ureteric stent and nephrostomy catheter in place. Marked right renal atrophy with chronic hydroureteronephrosis. This is followed to an area of persistent distal right ureteric hyperattenuation including on 77/2. Stomach/Bowel: The stomach is decompressed, appears mildly thick walled. A duodenal stent is similar in position, without upstream dilatation. Hartmann's pouch. The remaining sigmoid is thick walled. Descending colostomy. Proximal and mid small bowel loops are fluid-filled and dilated including at up to 4.3 cm on 47/2. 4.9 cm at the same  level on the prior. Transition point in the left upper pelvis again suspected, including on 67/2. Distal small bowel loops are decompressed. Normal appendix. Vascular/Lymphatic: Aortic atherosclerosis. No gross abdominopelvic adenopathy. Reproductive: Mild prostatomegaly. Other: No significant free fluid. No free intraperitoneal air. Right abdominal wall subcutaneous 2.6 cm nodule on 46/2 is similar to on the prior. Suspect developing left chest wall/intercostal metastasis, as evidenced by soft tissue fullness on 01/02. Musculoskeletal: Bilateral hip osteoarthritis. Lumbosacral spondylosis. IMPRESSION: 1. Degraded exam, secondary to lack of oral or IV contrast. 2. Since 01/07/2022, redemonstration of high-grade partial small-bowel obstruction with transition point in the upper left pelvis psych: likely due to an adhesion. 3. Pulmonary metastasis with loculated left-sided pleural effusion and probable anterior left chest wall intercostal metastasis. 4. Right renal atrophy and hydroureteronephrosis. Possible distal right ureteric stones versus retained contrast. 5. Similar right abdominal wall soft tissue nodule, suspicious for metastatic disease. Electronically Signed   By: Abigail Miyamoto M.D.   On: 01/28/2022 18:05   DG Chest 1 View  Result Date: 02/10/2022 CLINICAL DATA:  Status post thoracentesis EXAM: CHEST  1 VIEW COMPARISON:  02/09/2022 FINDINGS: Numerous bilateral pulmonary nodules. Trace right pleural effusion. Loculated left pleural effusion. No significant interval change compared with the prior exam. No pneumothorax. Stable cardiomediastinal silhouette. Right-sided Port-A-Cath in satisfactory position. No acute osseous abnormality. IMPRESSION: 1. No significant interval change compared with the prior exam. No pneumothorax. 2. Numerous bilateral pulmonary nodules. 3. Trace right pleural effusion. 4. Loculated left pleural effusion. Electronically Signed   By: Kathreen Devoid M.D.   On: 02/10/2022 13:55    DG Abd 1 View  Result Date: 02/05/2022 CLINICAL DATA:  NG tube placement. EXAM: ABDOMEN - 1 VIEW COMPARISON:  02/02/2022 FINDINGS: Similar central gaseous bowel distention noted upper abdomen. Stent device noted left paramidline abdomen with percutaneous and internal left urinary stents. NG tube tip is positioned in the distal esophagus just proximal to  the esophagogastric junction. Tube could be advanced 13-15 cm to place the proximal side port below the GE junction. IMPRESSION: NG tube tip is in the distal esophagus just proximal to the esophagogastric junction. Tube could be advanced 13-15 cm to place the proximal side port below the GE junction. Repeat x-ray could be performed at that time to ensure appropriate positioning. Electronically Signed   By: Misty Bender M.D.   On: 02/05/2022 12:54   DG Abd 1 View  Result Date: 02/02/2022 CLINICAL DATA:  Abdominal pain EXAM: ABDOMEN - 1 VIEW COMPARISON:  CT abdomen and pelvis 01/28/2022 FINDINGS: Abnormally distended gas-filled loops of small bowel in the mid abdomen measuring up to 6.7 cm in diameter in the supine position. Enteric tube tip is in the region of the stomach. Left-sided percutaneous nephrostomy tube and ureteral stent. Cholecystectomy clips. Vascular stent graft in the left mid abdomen. IMPRESSION: Abnormal bowel gas pattern suggesting small bowel obstruction or regional ileus. Electronically Signed   By: Ofilia Neas M.D.   On: 02/02/2022 11:08   US RENAL  Result Date: 02/04/2022 CLINICAL DATA:  Hydronephrosis EXAM: RENAL / URINARY TRACT ULTRASOUND COMPLETE COMPARISON:  CT 01/28/2022 FINDINGS: Right Kidney: Renal measurements: 9.4 x 4.3 x 4.9 cm = volume: 103.7 mL. Atrophic right kidney with moderate right-sided hydronephrosis. Upper pole exophytic cyst measuring up to 2.7 cm. Left Kidney: Renal measurements: 11.3 x 6.4 x 5.8 cm = volume: 218.8 mL. No hydronephrosis. Bladder: Not visualized Other: None. IMPRESSION: Atrophic right  kidney with chronic moderate right hydronephrosis. No left-sided hydronephrosis. Electronically Signed   By: Maurine Simmering M.D.   On: 02/04/2022 18:26   DG CHEST PORT 1 VIEW  Result Date: 02/18/2022 CLINICAL DATA:  Shortness of breath. EXAM: PORTABLE CHEST 1 VIEW COMPARISON:  One-view chest x-ray 02/16/2022. FINDINGS: Left lower pneumothorax is stable to minimally increased in size following removal of the chest tube. Airspace densities along the margin of the pneumothorax stable. Interstitial edema in the right lung is slightly increased. Right IJ Port-A-Cath is stable. IMPRESSION: 1. Stable to minimally increased size of left lower pneumothorax following removal of chest tube. 2. Slight increase in interstitial edema in the right lung. Electronically Signed   By: San Morelle M.D.   On: 02/18/2022 08:35   DG Chest Port 1 View  Result Date: 02/16/2022 CLINICAL DATA:  Empyema lung EXAM: PORTABLE CHEST - 1 VIEW COMPARISON:  02/14/2022 FINDINGS: Pigtail chest tube stable at the lateral right lung base. Loculated left inferolateral pleural gas in the empyema cavity as before. Extensive airspace consolidation in the left mid and lower lung stable. Some worsening of diffuse a airspace opacities on the right predominantly perihilar. Stable right IJ port catheter. Heart size and mediastinal contours are within normal limits. Visualized bones unremarkable. Left nephrostomy tube and proximal aspect of ureteral stent noted. Metallic GI stent in the left upper abdomen partially visualized. Cholecystectomy clips. IMPRESSION: 1. Persistent loculated gas in the left empyema cavity, with stable chest drain. 2. worsening of  right perihilar airspace opacities. Electronically Signed   By: Lucrezia Europe M.D.   On: 02/16/2022 08:52   DG CHEST PORT 1 VIEW  Result Date: 02/14/2022 CLINICAL DATA:  Empyema EXAM: PORTABLE CHEST 1 VIEW COMPARISON:  Chest x-ray dated February 13, 2022 FINDINGS: Cardiac and mediastinal contours  are unchanged. Bilateral nodular opacities and heterogeneous opacities of left lung, unchanged compared to prior. Small left hydropneumothorax with slightly decreased size of air component. Small right pleural effusion. IMPRESSION: Small left  hydropneumothorax with slightly decreased size of air component. Left-sided chest tube in place. Electronically Signed   By: Yetta Glassman M.D.   On: 02/14/2022 12:10   DG CHEST PORT 1 VIEW  Result Date: 02/13/2022 CLINICAL DATA:  Empyema lung EXAM: PORTABLE CHEST 1 VIEW COMPARISON:  02/11/2022 FINDINGS: Left basilar chest tube. Right chest wall port with catheter tip unchanged. Bilateral pulmonary opacities with nodularity are again identified. Overall aeration is slightly worse. Increased lucency at the left costophrenic angle suggesting greater pleural air conjunction with residual effusion. Similar cardiomediastinal contours. IMPRESSION: Bilateral pulmonary opacities with nodularity. Aeration is slightly worse. Increased lucency at the left costophrenic angle likely represents greater pneumothorax component of a hydropneumothorax. Electronically Signed   By: Macy Mis M.D.   On: 02/13/2022 11:39   DG CHEST PORT 1 VIEW  Result Date: 02/11/2022 CLINICAL DATA:  Left thoracentesis EXAM: PORTABLE CHEST 1 VIEW COMPARISON:  Previous studies including the examination of 02/10/2022 FINDINGS: There is interval placement of pigtail left chest tube with its tip in the lateral aspect of left lower lung fields. There is interval decrease in amount of left pleural effusion. Still, there is moderate residual effusion on the left side. Evaluation of left lower lung fields for infiltrates is limited by the effusion. There are patchy densities in the right lower lung fields with interval improvement. There are scattered nodular densities in the visualized portions of lung fields suggesting pulmonary metastatic disease. There is no pneumothorax. IMPRESSION: There is interval  decrease in left pleural effusion after placement of left chest tube. Still, there is moderate residual left pleural effusion. There are scattered noncalcified nodules in both lungs suggesting pulmonary metastatic disease. Electronically Signed   By: Elmer Picker M.D.   On: 02/11/2022 20:06   DG Chest Port 1 View  Result Date: 02/09/2022 CLINICAL DATA:  Leukocytosis, pancreatic cancer EXAM: PORTABLE CHEST 1 VIEW COMPARISON:  02/04/2022 FINDINGS: Right-sided chest port remains in place. Enteric tube has been removed. Stable heart size. Multiple pulmonary nodules and masses throughout both lungs. Moderate sized left pleural effusion. Trace right pleural effusion. No pneumothorax. IMPRESSION: 1. Moderate left and trace right pleural effusions. 2. Multiple pulmonary nodules and masses throughout both lungs compatible with known metastatic disease. Electronically Signed   By: Davina Poke D.O.   On: 02/09/2022 12:33   DG CHEST PORT 1 VIEW  Result Date: 02/04/2022 CLINICAL DATA:  Cough.  History of pancreatic cancer. EXAM: PORTABLE CHEST 1 VIEW COMPARISON:  January 07, 2022. FINDINGS: Stable cardiomediastinal silhouette. Nasogastric tube is seen entering stomach. Right internal jugular Port-A-Cath is unchanged. Bilateral pulmonary nodules are noted. Stable probable loculated left pleural effusion is noted. Bony thorax is unremarkable. IMPRESSION: Stable bilateral pulmonary nodules consistent with metastatic disease. Stable loculated left pleural effusion. Electronically Signed   By: Marijo Conception M.D.   On: 02/04/2022 11:24   DG Abd Portable 1V  Result Date: 02/18/2022 CLINICAL DATA:  Abdominal distension. EXAM: PORTABLE ABDOMEN - 1 VIEW COMPARISON:  Abdominal CT 02/12/2022. FINDINGS: Distal duodenal stent is noted with persistent moderate small bowel dilatation in the mid and right abdomen. There is possible fecalization of the distal small bowel. The colon appears decompressed. Scattered bowel  anastomosis clips, cholecystectomy clips, percutaneous left nephrostomy tube and left ureteral stent remain in place. There are mild degenerative changes within the spine. IMPRESSION: Persistent moderate diffuse small bowel dilatation following distal duodenal stenting, similar to recent prior studies, and improved from 01/28/2022. Findings may reflect persistent partial small-bowel obstruction  versus ileus. Electronically Signed   By: Richardean Sale M.D.   On: 02/18/2022 11:42   DG Abd Portable 1V  Result Date: 02/07/2022 CLINICAL DATA:  NG tube placement EXAM: PORTABLE ABDOMEN - 1 VIEW COMPARISON:  Previous studies including the examination done earlier today FINDINGS: Tip enteric tube is noted at the gastroesophageal junction. There is cephalad migration of the tip of enteric tube since the previous examination. Side port in the enteric tube is noted in the lower thoracic esophagus. Bowel gas pattern is nonspecific. Surgical clips are seen in the right upper quadrant. Surgical staples are seen in the epigastrium. There is percutaneous left nephrostomy catheter in place. Left ureteral stent is seen. There is possible stent in a small bowel loop in the mid abdomen. There is homogeneous opacification with air bronchogram in the left lower lung fields suggesting atelectasis/pneumonia. There are faint nodular densities in the right lower lung fields suggesting pulmonary metastatic disease. IMPRESSION: Tip of enteric tube is at the gastroesophageal junction with side port in the lower thoracic esophagus. Other findings as described in the body of the report. Electronically Signed   By: Elmer Picker M.D.   On: 02/07/2022 15:43   DG Abd Portable 1V  Result Date: 02/07/2022 CLINICAL DATA:  Confirm nasogastric tube placement. EXAM: PORTABLE ABDOMEN - 1 VIEW COMPARISON:  02/07/2022 at 5 a.m. FINDINGS: Nasogastric tube is in place, tip overlying the level of the proximal stomach. Side port is in the region of  the gastroesophageal junction. Position is similar to the prior study. Percutaneous nephrostomy and ureteral catheter appear unchanged. Duodenal stent is unchanged. No evidence for free intraperitoneal air. There is mild dilatation of small bowel loops, only partially imaged. There continues to be dense opacity of the LEFT lung base and numerous rounded masses throughout the lungs. IMPRESSION: Nasogastric tube tip overlying the level of the proximal stomach, similar in position to study. Electronically Signed   By: Nolon Nations M.D.   On: 02/07/2022 11:40   DG Abd Portable 1V  Result Date: 02/07/2022 CLINICAL DATA:  A male at age 51 presents for bowel obstruction. EXAM: PORTABLE ABDOMEN - 1 VIEW COMPARISON:  February 06, 2022. FINDINGS: LEFT-sided nephrostomy tube and nephroureteral stent remain in place. Gastric tube has been advanced since the prior study, side port likely at the level of the GE junction. Small bowel distension is diminished compared to previous imaging though remains dilated mildly in the central abdomen, potentially as much as 4.5 cm in the lower abdomen. No substantial stool visualized in the colon. Duodenal stent remaining in place similar position. On limited assessment there is no acute skeletal process. IMPRESSION: 1. Gastric tube has been advanced since the prior study, side port likely at the level of the GE junction. Consider approximally 2 cm advancement for more optimal placement. 2. Continued small bowel distension particularly in lower abdomen though diminished compared to previous imaging. Electronically Signed   By: Zetta Bills M.D.   On: 02/07/2022 07:58   DG Abd Portable 1V  Result Date: 02/06/2022 CLINICAL DATA:  Small-bowel obstruction, resection 2/14, no bowel movement EXAM: PORTABLE ABDOMEN - 1 VIEW COMPARISON:  02/05/2022 FINDINGS: Similar appearance of gas and fluid filled, distended small bowel in the central abdomen, largest loops measuring up to 5.0 cm in  caliber. Esophagogastric tube remains with tip at the level of the gastroesophageal junction, side port in the lower esophagus. Bowel stent projects at the left aspect of the lumbar spine. Left-sided percutaneous nephrostomy catheter and  left-sided ureteral stent, partially imaged. No obvious free air in the abdomen on single supine radiograph. IMPRESSION: 1. Similar appearance of gas and fluid filled, distended small bowel in the central abdomen, largest loops measuring up to 5.0 cm in caliber. Findings are consistent with ileus or obstruction. 2. Esophagogastric tube remains with tip at the level of the gastroesophageal junction, side port in the lower esophagus. Electronically Signed   By: Delanna Ahmadi M.D.   On: 02/06/2022 10:07   ECHOCARDIOGRAM COMPLETE BUBBLE STUDY  Result Date: 02/14/2022    ECHOCARDIOGRAM REPORT   Patient Name:   AMRAM MAYA Date of Exam: 02/14/2022 Medical Rec #:  [id number]      Height:       71.0 in Accession #:    [id number]     Weight:       153.9 lb Date of Birth:  April 08, 1941      BSA:          1.886 m Patient Age:    7 years       BP:           133/95 mmHg Patient Gender: M              HR:           80 bpm. Exam Location:  Inpatient Procedure: 2D Echo and Saline Contrast Bubble Study Indications:    Pleural effusion. Saline contrast study requested.  History:        Patient has no prior history of Echocardiogram examinations.  Sonographer:    Arlyss Gandy Referring Phys: [id number] [place]  1. The aortic valve is calcified. There is severe calcifcation of the aortic valve. There is severe thickening of the aortic valve. Aortic valve regurgitation is not visualized. LVOT VTI signal suboptimal and therefore difficult to calculate AVA and DI.  Suspect severe vs moderate-to-severe aortic stenosis. Mean gradient 76mHg, Vmax 3.537m. AVA by rough estimate 0.7cm2, DI 0.22. Consider TEE vs CTA for further evaluation.  2. Left ventricular ejection fraction, by  estimation, is 55 to 60%. The left ventricle has normal function. The left ventricle has no regional wall motion abnormalities. There is moderate hypertrophy of the basal septum. The rest of the LV segments demonstrate mild left ventricular hypertrophy. Left ventricular diastolic parameters are consistent with Grade I diastolic dysfunction (impaired relaxation).  3. Right ventricular systolic function is normal. The right ventricular size is normal. There is normal pulmonary artery systolic pressure.  4. The mitral valve is normal in structure. Trivial mitral valve regurgitation.  5. Aortic dilatation noted. There is moderate dilatation of the ascending aorta, measuring 45 mm.  6. The inferior vena cava is normal in size with greater than 50% respiratory variability, suggesting right atrial pressure of 3 mmHg. Comparison(s): No prior Echocardiogram. FINDINGS  Left Ventricle: Left ventricular ejection fraction, by estimation, is 55 to 60%. The left ventricle has normal function. The left ventricle has no regional wall motion abnormalities. The left ventricular internal cavity size was normal in size. There is  moderate hypertrophy of the basal septum. The rest of the LV segments demonstrate mild left ventricular hypertrophy. Left ventricular diastolic parameters are consistent with Grade I diastolic dysfunction (impaired relaxation). Right Ventricle: The right ventricular size is normal. No increase in right ventricular wall thickness. Right ventricular systolic function is normal. There is normal pulmonary artery systolic pressure. The tricuspid regurgitant velocity is 2.81 m/s, and  with an assumed right atrial pressure of 3 mmHg, the estimated right  ventricular systolic pressure is 53.6 mmHg. Left Atrium: Left atrial size was normal in size. Right Atrium: Right atrial size was normal in size. Pericardium: There is no evidence of pericardial effusion. Mitral Valve: The mitral valve is normal in structure. Mild  mitral annular calcification. Trivial mitral valve regurgitation. Tricuspid Valve: The tricuspid valve is normal in structure. Tricuspid valve regurgitation is trivial. Aortic Valve: The aortic valve is calcified. There is severe calcifcation of the aortic valve. There is severe thickening of the aortic valve. Aortic valve regurgitation is not visualized. Moderate to severe aortic stenosis is present. Aortic valve mean gradient measures 17.8 mmHg. Aortic valve peak gradient measures 32.6 mmHg. Aortic valve area, by VTI measures 0.97 cm. Pulmonic Valve: The pulmonic valve was not well visualized. Pulmonic valve regurgitation is trivial. Aorta: Aortic dilatation noted. There is moderate dilatation of the ascending aorta, measuring 45 mm. Venous: The inferior vena cava is normal in size with greater than 50% respiratory variability, suggesting right atrial pressure of 3 mmHg. IAS/Shunts: The atrial septum is grossly normal. Agitated saline contrast was given intravenously to evaluate for intracardiac shunting.  LEFT VENTRICLE PLAX 2D LVIDd:         3.40 cm   Diastology LVIDs:         2.40 cm   LV e' medial:    5.11 cm/s LV PW:         1.30 cm   LV E/e' medial:  12.6 LV IVS:        1.50 cm   LV e' lateral:   5.22 cm/s LVOT diam:     2.40 cm   LV E/e' lateral: 12.3 LV SV:         49 LV SV Index:   26 LVOT Area:     4.52 cm  RIGHT VENTRICLE             IVC RV Basal diam:  3.40 cm     IVC diam: 1.20 cm RV Mid diam:    2.90 cm RV S prime:     15.40 cm/s TAPSE (M-mode): 1.9 cm LEFT ATRIUM           Index        RIGHT ATRIUM           Index LA diam:      2.00 cm 1.06 cm/m   RA Area:     10.40 cm LA Vol (A2C): 16.1 ml 8.54 ml/m   RA Volume:   23.70 ml  12.56 ml/m LA Vol (A4C): 24.0 ml 12.72 ml/m  AORTIC VALVE AV Area (Vmax):    0.93 cm AV Area (Vmean):   0.98 cm AV Area (VTI):     0.97 cm AV Vmax:           285.50 cm/s AV Vmean:          187.250 cm/s AV VTI:            0.507 m AV Peak Grad:      32.6 mmHg AV Mean  Grad:      17.8 mmHg LVOT Vmax:         58.80 cm/s LVOT Vmean:        40.400 cm/s LVOT VTI:          0.109 m LVOT/AV VTI ratio: 0.21  AORTA Ao Root diam: 3.50 cm Ao Asc diam:  4.50 cm MITRAL VALVE               TRICUSPID VALVE  MV Area (PHT): 2.36 cm    TR Peak grad:   31.6 mmHg MV Decel Time: 322 msec    TR Vmax:        281.00 cm/s MV E velocity: 64.30 cm/s MV A velocity: 80.80 cm/s  SHUNTS MV E/A ratio:  0.80        Systemic VTI:  0.11 m                            Systemic Diam: 2.40 cm Gwyndolyn Kaufman MD Electronically signed by Gwyndolyn Kaufman MD Signature Date/Time: 02/14/2022/5:22:27 PM    Final    VAS [place] LOWER EXTREMITY VENOUS (DVT)  Result Date: 02/14/2022  Lower Venous DVT Study Patient Name:  ARA MANO  Date of Exam:   02/14/2022 Medical Rec #: [id number]       Accession #:    [id number] Date of Birth: 1941/06/18       Patient Gender: M Patient Age:   81 years Exam Location[facility]  [facility] Procedure:      VAS [place] LOWER EXTREMITY VENOUS (DVT) Referring Phys: [place] --------------------------------------------------------------------------------  Indications: Edema.  Comparison Study: No prior study Performing Technologist: Maudry Mayhew MHA, RDMS, RVT, RDCS  Examination Guidelines: A complete evaluation includes B-mode imaging, spectral Doppler, color Doppler, and power Doppler as needed of all accessible portions of each vessel. Bilateral testing is considered an integral part of a complete examination. Limited examinations for reoccurring indications may be performed as noted. The reflux portion of the exam is performed with the patient in reverse Trendelenburg.  +-----+---------------+---------+-----------+----------+--------------+  RIGHT Compressibility Phasicity Spontaneity Properties Thrombus Aging  +-----+---------------+---------+-----------+----------+--------------+  CFV   Full            Yes       Yes                                     +-----+---------------+---------+-----------+----------+--------------+   +---------+---------------+---------+-----------+----------+-----------------+  LEFT      Compressibility Phasicity Spontaneity Properties Thrombus Aging     +---------+---------------+---------+-----------+----------+-----------------+  CFV       Full            No        Yes                                       +---------+---------------+---------+-----------+----------+-----------------+  SFJ       Full                                                                +---------+---------------+---------+-----------+----------+-----------------+  FV Prox   Full                                                                +---------+---------------+---------+-----------+----------+-----------------+  FV Mid    None  No                     Acute              +---------+---------------+---------+-----------+----------+-----------------+  FV Distal None                      No                     Acute              +---------+---------------+---------+-----------+----------+-----------------+  PFV       Full                                                                +---------+---------------+---------+-----------+----------+-----------------+  POP       Full            No        Yes                                       +---------+---------------+---------+-----------+----------+-----------------+  PTV       None            Yes       Yes                    Age Indeterminate  +---------+---------------+---------+-----------+----------+-----------------+  PERO      None                      No                     Acute              +---------+---------------+---------+-----------+----------+-----------------+    Summary: RIGHT: - No evidence of common femoral vein obstruction.  LEFT: - Findings consistent with acute deep vein thrombosis involving the left femoral vein, and left peroneal veins. - Findings consistent with  age indeterminate deep vein thrombosis involving the left peroneal veins.  *See table(s) above for measurements and observations. Electronically signed by Servando Snare MD on 02/14/2022 at 10:42:12 AM.    Final    US THORACENTESIS ASP PLEURAL SPACE W/IMG GUIDE  Result Date: 02/10/2022 INDICATION: Patient with metastatic pancreatic cancer presents today with left pleural effusion. Interventional radiology asked to perform a diagnostic and therapeutic thoracentesis. EXAM: ULTRASOUND GUIDED THORACENTESIS MEDICATIONS: 1% lidocaine 10 mL COMPLICATIONS: None immediate. PROCEDURE: An ultrasound guided thoracentesis was thoroughly discussed with the patient and questions answered. The benefits, risks, alternatives and complications were also discussed. The patient understands and wishes to proceed with the procedure. Written consent was obtained. Ultrasound was performed to localize and mark an adequate pocket of fluid in the left chest. Loculated collection identified. The area was then prepped and draped in the normal sterile fashion. 1% Lidocaine was used for local anesthesia. Under ultrasound guidance a 6 Fr Safe-T-Centesis catheter was introduced. Thoracentesis was performed. The catheter was removed and a dressing applied. FINDINGS: A total of approximately 50 mL of light red/thick/purulent fluid was removed. Samples were sent to the laboratory as requested by the clinical team. IMPRESSION: Successful ultrasound guided left thoracentesis yielding  50 mL of pleural fluid. Read by: Soyla Dryer, NP Electronically Signed   By: Aletta Edouard M.D.   On: 02/10/2022 14:38    Microbiology: Results for orders placed or performed during the hospital encounter of 01/28/22  Urine Culture     Status: Abnormal   Collection Time: 01/28/22  3:00 PM   Specimen: Partial Nephrectomy Left; Urine  Result Value Ref Range Status   Specimen Description   Final    URINE, RANDOM Performed at [facility], [address] [place], [place] [address]    Special Requests   Final    NONE Performed at [facility], [address] [place], [place] [address]    Culture >=100,000 COLONIES/mL ESCHERICHIA COLI (A)  Final   Report Status 01/31/2022 FINAL  Final   Organism ID, Bacteria ESCHERICHIA COLI (A)  Final      Susceptibility   Escherichia coli - MIC*    AMPICILLIN 8 SENSITIVE Sensitive     CEFAZOLIN <=4 SENSITIVE Sensitive     CEFEPIME <=0.12 SENSITIVE Sensitive     CEFTRIAXONE <=0.25 SENSITIVE Sensitive     CIPROFLOXACIN <=0.25 SENSITIVE Sensitive     GENTAMICIN <=1 SENSITIVE Sensitive     IMIPENEM <=0.25 SENSITIVE Sensitive     NITROFURANTOIN <=16 SENSITIVE Sensitive     TRIMETH/SULFA <=20 SENSITIVE Sensitive     AMPICILLIN/SULBACTAM 4 SENSITIVE Sensitive     PIP/TAZO <=4 SENSITIVE Sensitive     * >=100,000 COLONIES/mL ESCHERICHIA COLI  Culture, blood (routine x 2)     Status: None   Collection Time: 02/04/22  3:42 PM   Specimen: BLOOD  Result Value Ref Range Status   Specimen Description BLOOD LEFT ANTECUBITAL  Final   Special Requests   Final    BOTTLES DRAWN AEROBIC AND ANAEROBIC Blood Culture adequate volume   Culture   Final    NO GROWTH 5 DAYS Performed at [facility], [place] [address], [place], [place] [address]    Report Status 02/09/2022 FINAL  Final  Culture, blood (routine x 2)     Status: None   Collection Time: 02/04/22  3:42 PM   Specimen: BLOOD  Result Value Ref Range Status   Specimen Description BLOOD RIGHT ANTECUBITAL  Final   Special Requests   Final    BOTTLES DRAWN AEROBIC ONLY Blood Culture results may not be optimal due to an inadequate volume of blood received in culture bottles   Culture   Final    NO GROWTH 5 DAYS Performed at [facility], [place] [address], [place], [place] [address]    Report Status 02/09/2022 FINAL  Final  Body fluid culture w Gram Stain     Status: None (Preliminary result)   Collection Time: 02/10/22  1:37 PM    Specimen: PATH Cytology Pleural fluid  Result Value Ref Range Status   Specimen Description   Final    PLEURAL Performed at [facility] [address] [place], [place] [address]    Special Requests   Final    NONE Performed at [facility], [place] [address] [place], [place] [address]    Gram Stain FEW WBC SEEN NO ORGANISMS SEEN   Final   Culture   Final    MODERATE ENTEROCOCCUS FAECIUM VANCOMYCIN RESISTANT ENTEROCOCCUS CRITICAL RESULT CALLED TO, READ BACK BY AND VERIFIED WITH: RN LISA.P AT 1144 ON 02/11/2022 BY T.SAAD. Sent to [place] for further susceptibility testing. Performed  at [facility], [place] [address] [place], [place] [address]    Report Status PENDING  Incomplete   Organism ID, Bacteria ENTEROCOCCUS FAECIUM  Final      Susceptibility   Enterococcus faecium - MIC*    AMPICILLIN >=32 RESISTANT Resistant     VANCOMYCIN >=32 RESISTANT Resistant     GENTAMICIN SYNERGY SENSITIVE Sensitive     LINEZOLID 2 SENSITIVE Sensitive     * MODERATE ENTEROCOCCUS FAECIUM    Labs: CBC: Recent Labs  Lab 02/15/22 0357 02/16/22 0358 02/17/22 0429 02/18/22 0358  WBC 20.2* 21.0* 23.4* 22.0*  NEUTROABS 15.3* 15.7* 17.6* 16.6*  HGB 10.3* 9.3* 9.5* 9.8*  HCT 33.2* 29.7* 31.3* 31.7*  MCV 96.0 96.1 98.4 98.4  PLT 1,066* 1,053* 1,020* 1,003*    Basic Metabolic Panel: Recent Labs  Lab 02/15/22 0357 02/16/22 0358 02/17/22 0429 02/19/22 0354  NA 141 140 137 132*  K 4.3 4.1 4.0 4.0  CL 107 107 103 99  CO2 '28 27 28 26  '$ GLUCOSE 150* 132* 119* 130*  BUN 48* 46* 46* 46*  CREATININE 1.25* 1.02 1.26* 1.20  CALCIUM 8.0* 7.8* 7.8* 7.9*  MG 2.1  --  2.1 2.0  PHOS 3.5  --  3.6 3.5    Liver Function Tests: Recent Labs  Lab 02/15/22 0357 02/16/22 0358 02/17/22 0429  AST 60* 45* 35  ALT 53* 40 32  ALKPHOS 228* 191* 177*  BILITOT 0.5 0.1* 0.3  PROT 5.7* 5.5* 5.6*  ALBUMIN 1.6* <1.5* 1.5*    CBG: Recent Labs  Lab  02/20/22 0003 02/20/22 0603 02/20/22 0647 02/20/22 0831 02/21/22 0605  GLUCAP 94 61* 89 76 71     Discharge time spent: greater than 30 minutes.  Signed: Nita Sells, MD [facility] 02/21/2022

## 2022-02-21 NOTE — Progress Notes (Signed)
Seen--stable for Hospice house-D/c Summary updated ?Verneita Griffes, MD ?[facility] ?9:00 AM ? ?

## 2022-02-21 NOTE — Progress Notes (Signed)
Unfortunately, Kevin Villegas is just deteriorating.  He is declining.  He now is comfort care.  The TNA has been stopped.  He has had a recurrent small bowel obstruction, despite having surgery.  He has had incredibly aggressive care and yet, he still has not gotten any better.  The fact that he has another bowel obstruction I think indicates that his cancer is clearly much more aggressive now.  We are at a point where his cancer really was not causing a lot of problems for him.  However, over the past couple months, I think has become apparent that the cancer has just changed its "nature" and is now becoming much more aggressive. ? ?He is not a candidate for any systemic therapy.  He is just not strong enough.  He has always wanted to hold off on treatment in the past which was certainly reasonable. ? ?I just hate the fact that his decline has been so quick.  However, I am sure that he would not want to "exist" and just being in bed. ? ?It sounds like he will go to Hospice up in [facility].  This would certainly be reasonable.  I know that the [facility] is very nice up there and they have a very good Hospice staff. ? ?The last labs that had a CBC was back on Friday.  His white cell count was elevated.  Platelet count was elevated.  Both were reactive.  I know that he had the Enterococcus in his pleural fluid.  This was vancomycin resistant.  This certainly has been a strain on his body. ? ?Again, he is tried as much that he could try.  He is given [place] as much as he could possibly give [place].  We just are not dealing with a malignancy that is showing its "nature". ? ?I would be surprised if he survived more than a week or so at this point. ? ?I know that he has really gotten aggressive care from everybody during his hospitalization.  I am so thankful that surgery could operate on him to try to relieve the initial obstruction.  That gave [place] a lot of information as to how much cancer he had.  He certainly had more  than what was known or seen on his scans. ? ?I know that his faith is strong.  I just want to make sure that he has comfort, respect and dignity.  I know that he will get this with [facility]. ? ?Lattie Haw, MD ? ?2 Timothy 4:6-8 ?

## 2022-02-22 MED ORDER — FENTANYL 25 MCG/HR TD PT72
1.0000 | MEDICATED_PATCH | TRANSDERMAL | Status: DC
  Administered 2022-02-22: 1 via TRANSDERMAL
  Filled 2022-02-22: qty 1

## 2022-02-22 NOTE — Progress Notes (Signed)
Kevin Villegas has confused this morning.  He knew who I was.  However, he was not sure and is aware he was in the hospital.  He said that he is was moved up to his room yesterday and was not told.  He says he is eating.  How much he really is eating. ? ?I had a long talk with his wife yesterday.  [place] live up in [place]  It is incredibly hard for her to make it down here since it is such a distance.  The ONLY place for him to go is the [facility].  This will be the ONLY option that I would except.  This would be easiest for his family, particular his wife.  There is absolutely no way for her to take care of him at home. ? ?Hopefully, there will be a bed available up there. ? ?He is just not aware of what is going on.  I know we have talked to him at length.  He still thinks that he is going to get chemotherapy.  I just have told him that he is not strong enough for chemotherapy. ? ?He does not seem to be in any obvious pain right now which is nice to see. ? ?I really just want what is easiest and best for Mr. Hussar and his family.  I think this clearly is going to be putting him in the Hospice Home.  I think his prognosis is definitely very limited and I would be surprised if he made it through the month of March. ? ?I think that for pain, a Duragesic patch would not be a bad idea.  I think that the concentrated morphine is also a good idea. ? ?I am not sure how long he needs to be on the Zyvox. ? ?Again, he absolutely needs to be in the [facility].  This is only 20 minutes from where they live.  It would be somewhat easier for his wife.  She is elderly. ? ?I do appreciate everybody's help with Mr. Chavira. ? ?Lattie Haw, MD ? ?Romans 8:28 ?

## 2022-02-22 NOTE — Progress Notes (Signed)
Patient has been reliably stabilized over the past several days-decisions were made with care team as well as wife with regards to discharge and it is in the patient's best interest to discharge to a Hospice that is closer to family ?Case management is working diligently to ensure that a bed is available-patient should discharge to facility as soon as bed becomes available ? ? ?No charge-discussed with family ?Verneita Griffes, MD ?[facility] ?12:56 PM ? ?

## 2022-02-22 NOTE — TOC Progression Note (Signed)
Transition of Care (TOC) - Progression Note  ? ? ?Patient Details  ?Name: Kevin Villegas ?MRN: [id number] ?Date of Birth: May 13, 1941 ? ?Transition of Care (TOC) CM/SW Contact  ?Purcell Mouton, RN ?Phone Number: ?02/22/2022, 9:40 AM ? ?Clinical Narrative:    ? ?A call was made to [place], there are no beds available.  ? ?Expected Discharge Plan: [place] ?Barriers to Discharge: Continued Medical Work up ? ?Expected Discharge Plan and Services ?Expected Discharge Plan: [place] ?  ?Discharge Planning Services: CM Consult ?  ?  ?Expected Discharge Date: 02/21/22               ?  ?  ?  ?  ?  ?  ?  ?  ?  ?  ? ? ?Social Determinants of Health (SDOH) Interventions ?  ? ?Readmission Risk Interventions ?Readmission Risk Prevention Plan 02/03/2022 06/16/2021 06/11/2021  ?Transportation Screening Complete Complete Complete  ?PCP or Specialist Appt within 3-5 Days - - Complete  ?[place] or Home Care Consult - - Complete  ?Social Work Consult for [place] Planning/Counseling - - Complete  ?Palliative Care Screening - - Not Applicable  ?Medication Review Press photographer) Complete Complete Complete  ?PCP or Specialist appointment within 3-5 days of discharge Complete Complete -  ?[place] or Home Care Consult Complete Complete -  ?SW Recovery Care/Counseling Consult Complete Complete -  ?Palliative Care Screening Not Applicable Not Applicable -  ?[place] Not Applicable Not Applicable -  ?Some recent data might be hidden  ? ? ?

## 2022-02-23 LAB — GLUCOSE, CAPILLARY
Glucose-Capillary: 65 mg/dL — ABNORMAL LOW (ref 70–99)
Glucose-Capillary: 70 mg/dL (ref 70–99)

## 2022-02-23 NOTE — Progress Notes (Addendum)
?Progress Note ? ? ?Patient: Kevin Villegas DVV:[id number] DOB: [birth year] DOA: 01/28/2022     25 ?DOS: the patient was seen and examined on 02/23/2022 ?  ?Brief hospital course: ?81 year old male with stage IV pancreatic cancer status post distal pancreatectomy in 2018 at [facility], status post diverting colostomy for colonic obstruction 2022, hydronephrosis status post left nephrostomy, hypertension, SBO, CKD stage III who presented with abdominal pain, found to have small bowel obstruction status post diagnostic laparoscopy with lysis of adhesion on 2/4.  He was treated aggressively with IV antibiotics, TPN, subsequently diagnosed with empyema with Enterococcus, received pigtail drainage with lytics, he also developed ex-vacuo  pneumothorax status post pigtail, likely represents trapped lung.  He was evaluated by ID, he was treated with IV antibiotics.  He was also diagnosed with lower extremity DVT.  Treated with heparin drip. ? ?Patient developed recurrence of SBO, x-ray showed possible partial small bowel obstruction. ? ?Dr. Marin Olp assisted with patient care and subsequently recommended patient to transition to comfort care. ?Palliative care meeting with patient and family plan was to transition to comfort care residential hospice in [place]. ?Currently awaiting for residential hospice ? 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 ?CT abdomen 2/25-small abscess in the abdominal wall-on wound care. ?Treated with IV antibiotics, TPN. Develops recurrence SBO.  ?Transition to comfort care.  ? ?Acute metabolic encephalopathy ?Multifactorial with deconditioning multiple complex comorbidities.   ? ?Thrombocytosis ?likely reactive.   See above ? ?Leg DVT (deep venous thromboembolism), acute, left ([place]) ?Duplex of the left leg + for acute DVT in left mid and distal femoral vein, posterior tibial vein and peroneal vein. Started heparin 2/27. ?Transition to comfort care.  ? ?Empyema lung ([place]) ?Treated with IV antibiotics, pigtail catheter, lytics.  ?Now comfort care  ? ?Empyema of left pleural space ([facility]) ?S/P thick purulent drainage- 50 ml by IR 2/23- grew Enterococcus, likely seeding previously known loculated left effusion likely malignant. ?S/p small bore chest tube placement 2/24 Dr Leandra Kern a candidate for surgery. ?-ID following and started on Zyvox ?-lytics done 2/25, and placed to suction 2/26-patient has a small left hydropneumothorax. ?Fluid cytology is negative.  ?Chest tube removed.  ?Now comfort care.  ? ?Goals of care, counseling/discussion ?Patient with metastatic pancreatic cancer  with complicated course and hospitalization appears ill deconditioned with failure to thrive.Remains DNR.   ?Palliative care meeting, decision was to proceed with comfort care. Transition to residential hospice facility.  ? ? ?E-coli UTI ?Completed antibiotics  ? ?Stage 3a chronic kidney disease (CKD) ([place]) ?AKI with creatinine at 1.59 improved to 1.1-1.2 with ivf, tpn.  Trend ?Recent Labs  ?Lab 02/09/22 ?0610 02/10/22 ?0812 02/12/22 ?6269 02/13/22 ?4854 02/14/22 ?6270  ?BUN 50* 51*  50* 55* 52* 46*  ?CREATININE 1.50* 1.23  1.20 1.19 1.24 1.15  ? ? ?Colostomy in place [facility] ?See #1 ? ?Essential hypertension ?Controlled, not on meds. ? ?Hydronephrosis of left kidney ?Due to tumor compression, s/p left nephrostomy last changed on 1/30, renal ultrasound on 2/17 -chronic right hydronephrosis with atrophic right kidney, urology advised outpatient follow-up.  Nephrostomy draining well ? ?Protein-calorie malnutrition, severe ?Augment nutritional status as tolerated.  Dietitian on board.  On Soft diet/TPN and wean TPN as tolerated ?Nutrition Problem: Severe  Malnutrition ?Etiology:  chronic illness, cancer and cancer related treatments ?Signs/Symptoms: severe fat depletion, severe muscle depletion, percent weight loss ?Interventions: TPN ? ? ? ?Leukocytosis ?Leukocytosis ?Thrombocytosis: ?Peaked to 38k-trended down to 18 K but now uptrending, afebrile.  Also has thrombocytosis which is most likely reactive.  On Zyvox.  Leukocytosis is  multifactorial due to patient's empyema and abdominal wall abscess. ?Recent Labs  ?Lab 02/11/22 ?3007 02/12/22 ?1039 02/13/22 ?6226 02/14/22 ?3335 02/15/22 ?0357  ?WBC 35.5* 31.2* 24.3* 18.4* 20.2*  ? ? ?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  with metastatic disease ?Transition to comfort care.  ?Dr Marin Olp assisted with management.  ? ? ? ? ?  ? ?Subjective: calm, denies pain.  ? ?Physical Exam: ?Vitals:  ? 02/21/22 1426 02/21/22 2220 02/22/22 1234 02/22/22 2016  ?BP: 133/85 (!) 144/93 133/77 (!) 147/88  ?Pulse: 80 84 85 83  ?Resp: (!) '24 18 20 20  '$ ?Temp: 97.9 ?F (36.6 ?C) 98.3 ?F (36.8 ?C) 97.9 ?F (36.6 ?C) 98.4 ?F (36.9 ?C)  ?TempSrc: Oral Oral Oral Oral  ?SpO2: 95% 92% 92% 94%  ?Weight:      ?Height:      ? ?General; NAD, Chronic ill appearing.  ? ?Data Reviewed: ? ?Records reviewed.  ? ?Family Communication: no family at bedside.  ? ?Disposition: ?Status is: Inpatient ?Remains inpatient appropriate because: recurrence SBO, empyema, FTT. Progression pancreatic cancer.  ? Planned Discharge Destination:  residential Hospice.  ? ? ? ?Time spent: 35 minutes ? ?Author: ?Elmarie Shiley, MD ?02/23/2022 3:51 PM ? ?For on call review [email or web address].  ?

## 2022-02-23 NOTE — Assessment & Plan Note (Signed)
Treated with IV antibiotics, pigtail catheter, lytics.  ?Now comfort care  ?

## 2022-02-23 NOTE — TOC Progression Note (Signed)
Transition of Care (TOC) - Progression Note  ? ? ?Patient Details  ?Name: Kevin Villegas ?MRN: [id number] ?Date of Birth: [birth year] ? ?Transition of Care (TOC) CM/SW Contact  ?Purcell Mouton, RN ?Phone Number: ?02/23/2022, 10:01 AM ? ?Clinical Narrative:    ? ?A call was made to [place], there are no beds available today.  ? ?Expected Discharge Plan: [place] ?Barriers to Discharge: Continued Medical Work up ? ?Expected Discharge Plan and Services ?Expected Discharge Plan: [place] ?  ?Discharge Planning Services: CM Consult ?  ?  ?Expected Discharge Date: 02/21/22               ?  ?  ?  ?  ?  ?  ?  ?  ?  ?  ? ? ?Social Determinants of Health (SDOH) Interventions ?  ? ?Readmission Risk Interventions ?Readmission Risk Prevention Plan 02/03/2022 06/16/2021 06/11/2021  ?Transportation Screening Complete Complete Complete  ?PCP or Specialist Appt within 3-5 Days - - Complete  ?[place] or Home Care Consult - - Complete  ?Social Work Consult for [place] Planning/Counseling - - Complete  ?Palliative Care Screening - - Not Applicable  ?Medication Review Press photographer) Complete Complete Complete  ?PCP or Specialist appointment within 3-5 days of discharge Complete Complete -  ?[place] or Home Care Consult Complete Complete -  ?SW Recovery Care/Counseling Consult Complete Complete -  ?Palliative Care Screening Not Applicable Not Applicable -  ?[place] Not Applicable Not Applicable -  ?Some recent data might be hidden  ? ? ?

## 2022-02-24 ENCOUNTER — Encounter: Payer: Self-pay | Admitting: *Deleted

## 2022-02-24 LAB — RESP PANEL BY RT-PCR (FLU A&B, COVID) ARPGX2
Influenza A by PCR: NEGATIVE
Influenza B by PCR: NEGATIVE
SARS Coronavirus 2 by RT PCR: NEGATIVE

## 2022-02-24 NOTE — TOC Progression Note (Signed)
Transition of Care (TOC) - Progression Note  ? ? ?Patient Details  ?Name: Kevin Villegas ?MRN: [id number] ?Date of Birth: Apr 29, 1941 ? ?Transition of Care (TOC) CM/SW Contact  ?Purcell Mouton, RN ?Phone Number: ?02/24/2022, 3:25 PM ? ?Clinical Narrative:    ?[facility] was called for transport.  ? ? ?Expected Discharge Plan: [place] ?Barriers to Discharge: Continued Medical Work up ? ?Expected Discharge Plan and Services ?Expected Discharge Plan: [place] ?  ?Discharge Planning Services: CM Consult ?  ?  ?Expected Discharge Date: 02/21/22               ?  ?  ?  ?  ?  ?  ?  ?  ?  ?  ? ? ?Social Determinants of Health (SDOH) Interventions ?  ? ?Readmission Risk Interventions ?Readmission Risk Prevention Plan 02/03/2022 06/16/2021 06/11/2021  ?Transportation Screening Complete Complete Complete  ?PCP or Specialist Appt within 3-5 Days - - Complete  ?[place] or Home Care Consult - - Complete  ?Social Work Consult for [place] Planning/Counseling - - Complete  ?Palliative Care Screening - - Not Applicable  ?Medication Review Press photographer) Complete Complete Complete  ?PCP or Specialist appointment within 3-5 days of discharge Complete Complete -  ?[place] or Home Care Consult Complete Complete -  ?SW Recovery Care/Counseling Consult Complete Complete -  ?Palliative Care Screening Not Applicable Not Applicable -  ?[place] Not Applicable Not Applicable -  ?Some recent data might be hidden  ? ? ?

## 2022-02-24 NOTE — Progress Notes (Signed)
Patient discharged from the hospital to residential hospice.  ? ?Oncology Nurse Navigator Documentation ? ?Oncology Nurse Navigator Flowsheets 02/24/2022  ?Navigator Follow Up Date: -  ?Navigator Follow Up Reason: -  ?Navigation Complete Date: 02/24/2022  ?Post Navigation: Continue to Follow Patient? No  ?Reason Not Navigating Patient: Hospice/Death  ?Navigator Location [facility]  ?Navigator Encounter Type Appt/Treatment Plan Review  ?Telephone -  ?Patient Visit Type MedOnc  ?Treatment Phase Other  ?Barriers/Navigation Needs No Barriers At This Time  ?Education -  ?Interventions None Required  ?Acuity Level 1-No Barriers  ?Coordination of Care -  ?Education Method -  ?Support Groups/Services Friends and Family  ?Time Spent with Patient 15  ?  ?

## 2022-02-24 NOTE — Discharge Summary (Signed)
Physician Discharge Summary   Patient: Tyrus Wilms MRN: [id number] DOB: 1941-10-22  Admit date:     01/28/2022  Discharge date: 02/24/22  Discharge Physician: Elmarie Shiley   PCP: Dettinger, [name], MD   Recommendations at discharge:   Residential Hospice.   Discharge Diagnoses: Principal Problem:   Partial small bowel obstruction (HCC) Active Problems:   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)M1  with metastatic disease   Leukocytosis   Protein-calorie malnutrition, severe   Hydronephrosis of left kidney   Essential hypertension   Colostomy in place [facility])   Stage 3a chronic kidney disease (CKD) ([place])   E-coli UTI   Goals of care, counseling/discussion   Empyema of left pleural space ([place])   Empyema lung (HCC)   Leg DVT (deep venous thromboembolism), acute, left (HCC)   Pressure injury of skin   Thrombocytosis   Acute metabolic encephalopathy  Resolved Problems:   * No resolved hospital problems. *  Hospital Course: 81 year old male with stage IV pancreatic cancer status post distal pancreatectomy in 2018 at [facility], status post diverting colostomy for colonic obstruction 2022, hydronephrosis status post left nephrostomy, hypertension, SBO, CKD stage III who presented with abdominal pain, found to have small bowel obstruction status post diagnostic laparoscopy with lysis of adhesion on 2/4.  He was treated aggressively with IV antibiotics, TPN, subsequently diagnosed with empyema with Enterococcus, received pigtail drainage with lytics, he also developed ex-vacuo  pneumothorax status post pigtail, likely represents trapped lung.  He was evaluated by ID, he was treated with IV antibiotics.  He was also diagnosed with lower extremity DVT.  Treated with heparin drip.  Patient developed recurrence of SBO, x-ray showed possible partial small bowel obstruction.  Dr. Marin Olp assisted with patient care and subsequently recommended patient to transition to comfort  care. Palliative care meeting with patient and family plan was to transition to comfort care residential hospice in [place]. Plan to transfer to residential Hospice.   Assessment and Plan: * Partial small bowel obstruction (HCC) Stage IV pancreatic cancer with partial SBO s/p  Surgery CT 2/25 with small abscess in the abdominal wall Persistent abdominal pain distention nausea vomiting: S/p LOA, SBR,biopsy 2/14- positive for ACC with mucinous features of left colon mesentery and jejunum. CT abdomen 2/25-small abscess in the abdominal wall-on wound care. Treated with IV antibiotics, TPN. Develops recurrence SBO.  Transition to comfort care.   Acute metabolic encephalopathy Multifactorial with deconditioning multiple complex comorbidities.    Thrombocytosis likely reactive.  See above  Leg DVT (deep venous thromboembolism), acute, left (HCC) Duplex of the left leg + for acute DVT in left mid and distal femoral vein, posterior tibial vein and peroneal vein. Started heparin 2/27. Transition to comfort care.   Empyema lung ([place]) Treated with IV antibiotics, pigtail catheter, lytics.  Now comfort care   Empyema of left pleural space (HCC) S/P thick purulent drainage- 50 ml by IR 2/23- grew Enterococcus, likely seeding previously known loculated left effusion likely malignant. S/p small bore chest tube placement 2/24 Dr Leandra Kern a candidate for surgery. -ID following and started on Zyvox -lytics done 2/25, and placed to suction 2/26-patient has a small left hydropneumothorax. Fluid cytology is negative.  Chest tube removed.  Now comfort care.   Goals of care, counseling/discussion Patient with metastatic pancreatic cancer  with complicated course and hospitalization appears ill deconditioned with failure to thrive.Remains DNR.   Palliative care meeting, decision was to proceed with comfort care. Transition to residential hospice facility.  E-coli UTI Completed  antibiotics   Stage 3a chronic kidney disease (CKD) ([place]) AKI with creatinine at 1.59 improved to 1.1-1.2 with ivf, tpn.  Trend Recent Labs  Lab 02/09/22 0610 02/10/22 0812 02/12/22 0551 02/13/22 0623 02/14/22 0713  BUN 50* 51*   50* 55* 52* 46*  CREATININE 1.50* 1.23   1.20 1.19 1.24 1.15    Colostomy in place [facility]) See #1  Essential hypertension Controlled, not on meds.  Hydronephrosis of left kidney Due to tumor compression, s/p left nephrostomy last changed on 1/30, renal ultrasound on 2/17 -chronic right hydronephrosis with atrophic right kidney, urology advised outpatient follow-up.  Nephrostomy draining well  Protein-calorie malnutrition, severe Augment nutritional status as tolerated.  Dietitian on board.  On Soft diet/TPN and wean TPN as tolerated Nutrition Problem: Severe Malnutrition Etiology: chronic illness, cancer and cancer related treatments Signs/Symptoms: severe fat depletion, severe muscle depletion, percent weight loss Interventions: TPN    Leukocytosis Leukocytosis Thrombocytosis: Peaked to 38k-trended down to 18 K but now uptrending, afebrile.  Also has thrombocytosis which is most likely reactive.  On Zyvox.  Leukocytosis is  multifactorial due to patient's empyema and abdominal wall abscess. Recent Labs  Lab 02/11/22 0607 02/12/22 1039 02/13/22 0623 02/14/22 0713 02/15/22 0357  WBC 35.5* 31.2* 24.3* 18.4* 20.2*    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)M1  with metastatic disease Transition to comfort care.  Dr Marin Olp assisted with management.          Consultants: Surgery, Dr Marin Olp, palliative Procedures performed: Surgery. Chest tube Disposition: Hospice care Diet recommendation:  Discharge Diet Orders (From admission, onward)     Start     Ordered   02/20/22 0000  Diet - low sodium heart healthy        02/20/22 1708           Regular diet DISCHARGE MEDICATION: Allergies as of 02/24/2022       Reactions    Pollen Extract Other (See Comments)   Sinus congestion        Medication List     STOP taking these medications    mirtazapine 15 MG tablet Commonly known as: REMERON   multivitamin with minerals Tabs tablet   polyethylene glycol 17 g packet Commonly known as: MIRALAX / GLYCOLAX        Discharge Exam: Filed Weights   02/17/22 0500 02/18/22 0500 02/19/22 0500  Weight: 70.6 kg 70 kg 70.6 kg   General; lethargic, appear comfortable.   Condition at discharge: fair  The results of significant diagnostics from this hospitalization (including imaging, microbiology, ancillary and laboratory) are listed below for reference.   Imaging Studies: CT ABDOMEN PELVIS WO CONTRAST  Result Date: 02/12/2022 CLINICAL DATA:  Abdominal pain, pancreatic carcinoma EXAM: CT ABDOMEN AND PELVIS WITHOUT CONTRAST TECHNIQUE: Multidetector CT imaging of the abdomen and pelvis was performed following the standard protocol without IV contrast. RADIATION DOSE REDUCTION: This exam was performed according to the departmental dose-optimization program which includes automated exposure control, adjustment of the mA and/or kV according to patient size and/or use of iterative reconstruction technique. COMPARISON:  02/07/2022 FINDINGS: Lower chest: There is interval placement of left chest tube with a decrease in the amount of left pleural effusion. There is residual moderate bilateral pleural effusions. Atelectatic changes are noted in the lower lung fields, more so on the left side. There are multiple noncalcified nodules of varying sizes in both lower lung fields suggesting pulmonary metastatic disease. Hepatobiliary: There are few low-density lesions in the  liver largest measuring 2.3 cm in the right lobe. There is no significant interval change. Density measurements suggest possible cysts. Surgical clips are seen in gallbladder fossa. Pancreas: No focal abnormality is seen. Spleen: Not seen Adrenals/Urinary Tract:  There is mild nodularity in the adrenals. There is marked right hydronephrosis. Right ureter is dilated. There is percutaneous left nephrostomy tube ureteral stent. There is no hydronephrosis in the left kidney. There is 3.1 cm cyst in the upper pole of right kidney. Right kidney is smaller than left with cortical atrophy. Stomach/Bowel: Stomach is not distended. There is stent in the distal duodenum. There is dilation of small-bowel loops measuring up to 5.5 cm in maximum diameter. Appendix is not distinctly seen. Scattered diverticula are seen in colon without signs of focal diverticulitis. There is liquid stool in the rectum. Colostomy is seen in left paramedian location slightly above the level of umbilicus. Vascular/Lymphatic: Scattered arterial calcifications are seen. Reproductive: Unremarkable. Other: There is diffuse edema in subcutaneous plane suggesting anasarca. There is 2.7 cm soft tissue nodule in the right anterior abdominal wall with no significant change. There is no pneumoperitoneum. Small to moderate ascites is present. Musculoskeletal: Degenerative changes are noted in the lumbar spine with spinal stenosis and encroachment of neural foramina at multiple levels. IMPRESSION: There is decrease in left pleural effusion after placement of left chest tube. Moderate bilateral pleural effusions are present. There are multiple noncalcified nodules of varying sizes in the lower lung fields consistent with pulmonary metastatic disease. There is dilation of small-bowel loops with interval worsening suggesting ileus or partial obstruction. Diverticulosis of colon without signs of diverticulitis. Severe chronic dilation of collecting system and ureter on the right side with no significant change. There is percutaneous nephrostomy and ureteral stent on the left side. Small to moderate ascites. Other findings as described in the body of the report. Electronically Signed   By: Elmer Picker M.D.   On:  02/12/2022 14:01   CT ABDOMEN PELVIS WO CONTRAST  Result Date: 02/08/2022 CLINICAL DATA:  Abdominal pain. Postop, assess for intra-abdominal abscess. Patient is post small bowel resection and excisional biopsy of mesenteric mass on 02/01/2022 EXAM: CT ABDOMEN AND PELVIS WITHOUT CONTRAST TECHNIQUE: Multidetector CT imaging of the abdomen and pelvis was performed following the standard protocol without IV contrast. Patient declined IV contrast administration. RADIATION DOSE REDUCTION: This exam was performed according to the departmental dose-optimization program which includes automated exposure control, adjustment of the mA and/or kV according to patient size and/or use of iterative reconstruction technique. COMPARISON:  Preoperative CT 01/28/2022 FINDINGS: Lower chest: Pulmonary metastatic disease in the lung bases with multiple pulmonary nodules. Enlarging left pleural effusion with pleural thickening, possibly metastatic. Increasing compressive atelectasis in the included left lung base. Moderate-sized right pleural effusion is new. Hepatobiliary: Scattered water density lesions in the liver are unchanged from prior exam likely cysts cholecystectomy. Common bile duct is difficult defined, there is no visualized biliary dilatation. Pancreas: The pancreas is difficult to delineate on the current exam, electronic records states distal pancreatectomy. Spleen: Surgically absent. Adrenals/Urinary Tract: No adrenal nodule. There is right hydroureteronephrosis with the ureter being dilated to the pelvis, the previous hyperattenuating focus in the distal right ureter tentatively visualized series 2, image 75. Marked right renal atrophy. Left nephrostomy and left nephroureteral stent in place. There is no left hydronephrosis. The urinary bladder is poorly defined, appears thick walled. There is air in the bladder. Stomach/Bowel: Detailed bowel assessment is limited given paucity of intra-abdominal fat and motion.  Enteric tube terminates in the gastroesophageal junction. The stomach is decompressed. A duodenal stent is unchanged in position, patent. Descending colostomy with Hartmann's pouch. Small bowel is mildly prominent but nonobstructed, and enteric contrast is seen throughout the colon. Bowel loops in the pelvis are difficult to define, there is fluid and stool within the and set to sigmoid colon. Vascular/Lymphatic: Aortic atherosclerosis without aneurysm. Limited assessment for adenopathy given lack of IV contrast and motion artifact. Reproductive: Prostate is unremarkable. Other: There is a small amount of non organized free fluid in the pelvis. Moderate right upper quadrant ascites. Generalized edema of the intra-abdominal and mesenteric fat. There is no evidence of focal fluid collection/abscess. No free air. Diffuse subcutaneous edema. Soft tissue nodule in the right anterior abdominal wall. Musculoskeletal: No focal bone lesion. Degenerative change in the spine and hips. IMPRESSION: 1. No evidence of postoperative intra-abdominal abscess. Moderate right upper quadrant free fluid and small amount of free fluid in the pelvis is non organized. 2. Enteric tube terminates at the gastroesophageal junction. 3. Left nephrostomy and left nephroureteral stent in place without left hydronephrosis. Chronic right renal atrophy and hydroureteronephrosis. 4. Pulmonary metastatic disease in the lung bases with multiple pulmonary nodules. Enlarging left pleural effusion with pleural thickening, possibly metastatic. Moderate-sized right pleural effusion is new. 5. Diffuse body wall edema. 6. Soft tissue nodule in the right anterior abdominal wall is unchanged, possibly metastatic. 7. Additional chronic findings are unchanged. Aortic Atherosclerosis (ICD10-I70.0). Electronically Signed   By: Keith Rake M.D.   On: 02/08/2022 00:13   CT ABDOMEN PELVIS WO CONTRAST  Result Date: 01/28/2022 CLINICAL DATA:  Abdominal pain.  Suspicion of bowel obstruction. History of colostomy EXAM: CT ABDOMEN AND PELVIS WITHOUT CONTRAST TECHNIQUE: Multidetector CT imaging of the abdomen and pelvis was performed following the standard protocol without IV contrast. RADIATION DOSE REDUCTION: This exam was performed according to the departmental dose-optimization program which includes automated exposure control, adjustment of the mA and/or kV according to patient size and/or use of iterative reconstruction technique. COMPARISON:  01/07/2022 FINDINGS: Lower chest: Bilateral pulmonary nodules consistent with metastasis. Index right lower lobe 2.2 cm nodule on 12/04 is similar to on the prior. Lingular nodule measures 1.2 cm on 12/04. Loculated left-sided pleural effusion with pleural thickening, similar. Hepatobiliary: Hepatic cysts. Cholecystectomy, without biliary ductal dilatation. Pancreas: Per report partial pancreatectomy. Pancreas poorly evaluated secondary to technique and paucity of retroperitoneal fat. Spleen: Surgically absent Adrenals/Urinary Tract: Normal adrenal glands. Left ureteric stent and nephrostomy catheter in place. Marked right renal atrophy with chronic hydroureteronephrosis. This is followed to an area of persistent distal right ureteric hyperattenuation including on 77/2. Stomach/Bowel: The stomach is decompressed, appears mildly thick walled. A duodenal stent is similar in position, without upstream dilatation. Hartmann's pouch. The remaining sigmoid is thick walled. Descending colostomy. Proximal and mid small bowel loops are fluid-filled and dilated including at up to 4.3 cm on 47/2. 4.9 cm at the same level on the prior. Transition point in the left upper pelvis again suspected, including on 67/2. Distal small bowel loops are decompressed. Normal appendix. Vascular/Lymphatic: Aortic atherosclerosis. No gross abdominopelvic adenopathy. Reproductive: Mild prostatomegaly. Other: No significant free fluid. No free intraperitoneal  air. Right abdominal wall subcutaneous 2.6 cm nodule on 46/2 is similar to on the prior. Suspect developing left chest wall/intercostal metastasis, as evidenced by soft tissue fullness on 01/02. Musculoskeletal: Bilateral hip osteoarthritis. Lumbosacral spondylosis. IMPRESSION: 1. Degraded exam, secondary to lack of oral or IV contrast. 2. Since 01/07/2022, redemonstration of high-grade  partial small-bowel obstruction with transition point in the upper left pelvis psych: likely due to an adhesion. 3. Pulmonary metastasis with loculated left-sided pleural effusion and probable anterior left chest wall intercostal metastasis. 4. Right renal atrophy and hydroureteronephrosis. Possible distal right ureteric stones versus retained contrast. 5. Similar right abdominal wall soft tissue nodule, suspicious for metastatic disease. Electronically Signed   By: Abigail Miyamoto M.D.   On: 01/28/2022 18:05   DG Chest 1 View  Result Date: 02/10/2022 CLINICAL DATA:  Status post thoracentesis EXAM: CHEST  1 VIEW COMPARISON:  02/09/2022 FINDINGS: Numerous bilateral pulmonary nodules. Trace right pleural effusion. Loculated left pleural effusion. No significant interval change compared with the prior exam. No pneumothorax. Stable cardiomediastinal silhouette. Right-sided Port-A-Cath in satisfactory position. No acute osseous abnormality. IMPRESSION: 1. No significant interval change compared with the prior exam. No pneumothorax. 2. Numerous bilateral pulmonary nodules. 3. Trace right pleural effusion. 4. Loculated left pleural effusion. Electronically Signed   By: Kathreen Devoid M.D.   On: 02/10/2022 13:55   DG Abd 1 View  Result Date: 02/05/2022 CLINICAL DATA:  NG tube placement. EXAM: ABDOMEN - 1 VIEW COMPARISON:  02/02/2022 FINDINGS: Similar central gaseous bowel distention noted upper abdomen. Stent device noted left paramidline abdomen with percutaneous and internal left urinary stents. NG tube tip is positioned in the distal  esophagus just proximal to the esophagogastric junction. Tube could be advanced 13-15 cm to place the proximal side port below the GE junction. IMPRESSION: NG tube tip is in the distal esophagus just proximal to the esophagogastric junction. Tube could be advanced 13-15 cm to place the proximal side port below the GE junction. Repeat x-ray could be performed at that time to ensure appropriate positioning. Electronically Signed   By: Misty Behring M.D.   On: 02/05/2022 12:54   DG Abd 1 View  Result Date: 02/02/2022 CLINICAL DATA:  Abdominal pain EXAM: ABDOMEN - 1 VIEW COMPARISON:  CT abdomen and pelvis 01/28/2022 FINDINGS: Abnormally distended gas-filled loops of small bowel in the mid abdomen measuring up to 6.7 cm in diameter in the supine position. Enteric tube tip is in the region of the stomach. Left-sided percutaneous nephrostomy tube and ureteral stent. Cholecystectomy clips. Vascular stent graft in the left mid abdomen. IMPRESSION: Abnormal bowel gas pattern suggesting small bowel obstruction or regional ileus. Electronically Signed   By: Ofilia Neas M.D.   On: 02/02/2022 11:08   US RENAL  Result Date: 02/04/2022 CLINICAL DATA:  Hydronephrosis EXAM: RENAL / URINARY TRACT ULTRASOUND COMPLETE COMPARISON:  CT 01/28/2022 FINDINGS: Right Kidney: Renal measurements: 9.4 x 4.3 x 4.9 cm = volume: 103.7 mL. Atrophic right kidney with moderate right-sided hydronephrosis. Upper pole exophytic cyst measuring up to 2.7 cm. Left Kidney: Renal measurements: 11.3 x 6.4 x 5.8 cm = volume: 218.8 mL. No hydronephrosis. Bladder: Not visualized Other: None. IMPRESSION: Atrophic right kidney with chronic moderate right hydronephrosis. No left-sided hydronephrosis. Electronically Signed   By: Maurine Simmering M.D.   On: 02/04/2022 18:26   DG CHEST PORT 1 VIEW  Result Date: 02/18/2022 CLINICAL DATA:  Shortness of breath. EXAM: PORTABLE CHEST 1 VIEW COMPARISON:  One-view chest x-ray 02/16/2022. FINDINGS: Left lower  pneumothorax is stable to minimally increased in size following removal of the chest tube. Airspace densities along the margin of the pneumothorax stable. Interstitial edema in the right lung is slightly increased. Right IJ Port-A-Cath is stable. IMPRESSION: 1. Stable to minimally increased size of left lower pneumothorax following removal of chest tube. 2.  Slight increase in interstitial edema in the right lung. Electronically Signed   By: San Morelle M.D.   On: 02/18/2022 08:35   DG Chest Port 1 View  Result Date: 02/16/2022 CLINICAL DATA:  Empyema lung EXAM: PORTABLE CHEST - 1 VIEW COMPARISON:  02/14/2022 FINDINGS: Pigtail chest tube stable at the lateral right lung base. Loculated left inferolateral pleural gas in the empyema cavity as before. Extensive airspace consolidation in the left mid and lower lung stable. Some worsening of diffuse a airspace opacities on the right predominantly perihilar. Stable right IJ port catheter. Heart size and mediastinal contours are within normal limits. Visualized bones unremarkable. Left nephrostomy tube and proximal aspect of ureteral stent noted. Metallic GI stent in the left upper abdomen partially visualized. Cholecystectomy clips. IMPRESSION: 1. Persistent loculated gas in the left empyema cavity, with stable chest drain. 2. worsening of  right perihilar airspace opacities. Electronically Signed   By: Lucrezia Europe M.D.   On: 02/16/2022 08:52   DG CHEST PORT 1 VIEW  Result Date: 02/14/2022 CLINICAL DATA:  Empyema EXAM: PORTABLE CHEST 1 VIEW COMPARISON:  Chest x-ray dated February 13, 2022 FINDINGS: Cardiac and mediastinal contours are unchanged. Bilateral nodular opacities and heterogeneous opacities of left lung, unchanged compared to prior. Small left hydropneumothorax with slightly decreased size of air component. Small right pleural effusion. IMPRESSION: Small left hydropneumothorax with slightly decreased size of air component. Left-sided chest tube in  place. Electronically Signed   By: Yetta Glassman M.D.   On: 02/14/2022 12:10   DG CHEST PORT 1 VIEW  Result Date: 02/13/2022 CLINICAL DATA:  Empyema lung EXAM: PORTABLE CHEST 1 VIEW COMPARISON:  02/11/2022 FINDINGS: Left basilar chest tube. Right chest wall port with catheter tip unchanged. Bilateral pulmonary opacities with nodularity are again identified. Overall aeration is slightly worse. Increased lucency at the left costophrenic angle suggesting greater pleural air conjunction with residual effusion. Similar cardiomediastinal contours. IMPRESSION: Bilateral pulmonary opacities with nodularity. Aeration is slightly worse. Increased lucency at the left costophrenic angle likely represents greater pneumothorax component of a hydropneumothorax. Electronically Signed   By: Macy Mis M.D.   On: 02/13/2022 11:39   DG CHEST PORT 1 VIEW  Result Date: 02/11/2022 CLINICAL DATA:  Left thoracentesis EXAM: PORTABLE CHEST 1 VIEW COMPARISON:  Previous studies including the examination of 02/10/2022 FINDINGS: There is interval placement of pigtail left chest tube with its tip in the lateral aspect of left lower lung fields. There is interval decrease in amount of left pleural effusion. Still, there is moderate residual effusion on the left side. Evaluation of left lower lung fields for infiltrates is limited by the effusion. There are patchy densities in the right lower lung fields with interval improvement. There are scattered nodular densities in the visualized portions of lung fields suggesting pulmonary metastatic disease. There is no pneumothorax. IMPRESSION: There is interval decrease in left pleural effusion after placement of left chest tube. Still, there is moderate residual left pleural effusion. There are scattered noncalcified nodules in both lungs suggesting pulmonary metastatic disease. Electronically Signed   By: Elmer Picker M.D.   On: 02/11/2022 20:06   DG Chest Port 1 View  Result  Date: 02/09/2022 CLINICAL DATA:  Leukocytosis, pancreatic cancer EXAM: PORTABLE CHEST 1 VIEW COMPARISON:  02/04/2022 FINDINGS: Right-sided chest port remains in place. Enteric tube has been removed. Stable heart size. Multiple pulmonary nodules and masses throughout both lungs. Moderate sized left pleural effusion. Trace right pleural effusion. No pneumothorax. IMPRESSION: 1. Moderate left and  trace right pleural effusions. 2. Multiple pulmonary nodules and masses throughout both lungs compatible with known metastatic disease. Electronically Signed   By: Davina Poke D.O.   On: 02/09/2022 12:33   DG CHEST PORT 1 VIEW  Result Date: 02/04/2022 CLINICAL DATA:  Cough.  History of pancreatic cancer. EXAM: PORTABLE CHEST 1 VIEW COMPARISON:  January 07, 2022. FINDINGS: Stable cardiomediastinal silhouette. Nasogastric tube is seen entering stomach. Right internal jugular Port-A-Cath is unchanged. Bilateral pulmonary nodules are noted. Stable probable loculated left pleural effusion is noted. Bony thorax is unremarkable. IMPRESSION: Stable bilateral pulmonary nodules consistent with metastatic disease. Stable loculated left pleural effusion. Electronically Signed   By: Marijo Conception M.D.   On: 02/04/2022 11:24   DG Abd Portable 1V  Result Date: 02/18/2022 CLINICAL DATA:  Abdominal distension. EXAM: PORTABLE ABDOMEN - 1 VIEW COMPARISON:  Abdominal CT 02/12/2022. FINDINGS: Distal duodenal stent is noted with persistent moderate small bowel dilatation in the mid and right abdomen. There is possible fecalization of the distal small bowel. The colon appears decompressed. Scattered bowel anastomosis clips, cholecystectomy clips, percutaneous left nephrostomy tube and left ureteral stent remain in place. There are mild degenerative changes within the spine. IMPRESSION: Persistent moderate diffuse small bowel dilatation following distal duodenal stenting, similar to recent prior studies, and improved from 01/28/2022.  Findings may reflect persistent partial small-bowel obstruction versus ileus. Electronically Signed   By: Richardean Sale M.D.   On: 02/18/2022 11:42   DG Abd Portable 1V  Result Date: 02/07/2022 CLINICAL DATA:  NG tube placement EXAM: PORTABLE ABDOMEN - 1 VIEW COMPARISON:  Previous studies including the examination done earlier today FINDINGS: Tip enteric tube is noted at the gastroesophageal junction. There is cephalad migration of the tip of enteric tube since the previous examination. Side port in the enteric tube is noted in the lower thoracic esophagus. Bowel gas pattern is nonspecific. Surgical clips are seen in the right upper quadrant. Surgical staples are seen in the epigastrium. There is percutaneous left nephrostomy catheter in place. Left ureteral stent is seen. There is possible stent in a small bowel loop in the mid abdomen. There is homogeneous opacification with air bronchogram in the left lower lung fields suggesting atelectasis/pneumonia. There are faint nodular densities in the right lower lung fields suggesting pulmonary metastatic disease. IMPRESSION: Tip of enteric tube is at the gastroesophageal junction with side port in the lower thoracic esophagus. Other findings as described in the body of the report. Electronically Signed   By: Elmer Picker M.D.   On: 02/07/2022 15:43   DG Abd Portable 1V  Result Date: 02/07/2022 CLINICAL DATA:  Confirm nasogastric tube placement. EXAM: PORTABLE ABDOMEN - 1 VIEW COMPARISON:  02/07/2022 at 5 a.m. FINDINGS: Nasogastric tube is in place, tip overlying the level of the proximal stomach. Side port is in the region of the gastroesophageal junction. Position is similar to the prior study. Percutaneous nephrostomy and ureteral catheter appear unchanged. Duodenal stent is unchanged. No evidence for free intraperitoneal air. There is mild dilatation of small bowel loops, only partially imaged. There continues to be dense opacity of the LEFT lung  base and numerous rounded masses throughout the lungs. IMPRESSION: Nasogastric tube tip overlying the level of the proximal stomach, similar in position to study. Electronically Signed   By: Nolon Nations M.D.   On: 02/07/2022 11:40   DG Abd Portable 1V  Result Date: 02/07/2022 CLINICAL DATA:  A male at age 87 presents for bowel obstruction. EXAM: PORTABLE ABDOMEN -  1 VIEW COMPARISON:  February 06, 2022. FINDINGS: LEFT-sided nephrostomy tube and nephroureteral stent remain in place. Gastric tube has been advanced since the prior study, side port likely at the level of the GE junction. Small bowel distension is diminished compared to previous imaging though remains dilated mildly in the central abdomen, potentially as much as 4.5 cm in the lower abdomen. No substantial stool visualized in the colon. Duodenal stent remaining in place similar position. On limited assessment there is no acute skeletal process. IMPRESSION: 1. Gastric tube has been advanced since the prior study, side port likely at the level of the GE junction. Consider approximally 2 cm advancement for more optimal placement. 2. Continued small bowel distension particularly in lower abdomen though diminished compared to previous imaging. Electronically Signed   By: Zetta Bills M.D.   On: 02/07/2022 07:58   DG Abd Portable 1V  Result Date: 02/06/2022 CLINICAL DATA:  Small-bowel obstruction, resection 2/14, no bowel movement EXAM: PORTABLE ABDOMEN - 1 VIEW COMPARISON:  02/05/2022 FINDINGS: Similar appearance of gas and fluid filled, distended small bowel in the central abdomen, largest loops measuring up to 5.0 cm in caliber. Esophagogastric tube remains with tip at the level of the gastroesophageal junction, side port in the lower esophagus. Bowel stent projects at the left aspect of the lumbar spine. Left-sided percutaneous nephrostomy catheter and left-sided ureteral stent, partially imaged. No obvious free air in the abdomen on single  supine radiograph. IMPRESSION: 1. Similar appearance of gas and fluid filled, distended small bowel in the central abdomen, largest loops measuring up to 5.0 cm in caliber. Findings are consistent with ileus or obstruction. 2. Esophagogastric tube remains with tip at the level of the gastroesophageal junction, side port in the lower esophagus. Electronically Signed   By: Delanna Ahmadi M.D.   On: 02/06/2022 10:07   ECHOCARDIOGRAM COMPLETE BUBBLE STUDY  Result Date: 02/14/2022    ECHOCARDIOGRAM REPORT   Patient Name:   TYESON TANIMOTO Date of Exam: 02/14/2022 Medical Rec #:  [id number]      Height:       71.0 in Accession #:    [id number]     Weight:       153.9 lb Date of Birth:  17-Jan-1941      BSA:          1.886 m Patient Age:    81 years       BP:           133/95 mmHg Patient Gender: M              HR:           80 bpm. Exam Location:  Inpatient Procedure: 2D Echo and Saline Contrast Bubble Study Indications:    Pleural effusion. Saline contrast study requested.  History:        Patient has no prior history of Echocardiogram examinations.  Sonographer:    Arlyss Gandy Referring Phys: [id number] [place]  1. The aortic valve is calcified. There is severe calcifcation of the aortic valve. There is severe thickening of the aortic valve. Aortic valve regurgitation is not visualized. LVOT VTI signal suboptimal and therefore difficult to calculate AVA and DI.  Suspect severe vs moderate-to-severe aortic stenosis. Mean gradient 17mHg, Vmax 3.535m. AVA by rough estimate 0.7cm2, DI 0.22. Consider TEE vs CTA for further evaluation.  2. Left ventricular ejection fraction, by estimation, is 55 to 60%. The left ventricle has normal function. The left ventricle has no regional wall  motion abnormalities. There is moderate hypertrophy of the basal septum. The rest of the LV segments demonstrate mild left ventricular hypertrophy. Left ventricular diastolic parameters are consistent with Grade I diastolic  dysfunction (impaired relaxation).  3. Right ventricular systolic function is normal. The right ventricular size is normal. There is normal pulmonary artery systolic pressure.  4. The mitral valve is normal in structure. Trivial mitral valve regurgitation.  5. Aortic dilatation noted. There is moderate dilatation of the ascending aorta, measuring 45 mm.  6. The inferior vena cava is normal in size with greater than 50% respiratory variability, suggesting right atrial pressure of 3 mmHg. Comparison(s): No prior Echocardiogram. FINDINGS  Left Ventricle: Left ventricular ejection fraction, by estimation, is 55 to 60%. The left ventricle has normal function. The left ventricle has no regional wall motion abnormalities. The left ventricular internal cavity size was normal in size. There is  moderate hypertrophy of the basal septum. The rest of the LV segments demonstrate mild left ventricular hypertrophy. Left ventricular diastolic parameters are consistent with Grade I diastolic dysfunction (impaired relaxation). Right Ventricle: The right ventricular size is normal. No increase in right ventricular wall thickness. Right ventricular systolic function is normal. There is normal pulmonary artery systolic pressure. The tricuspid regurgitant velocity is 2.81 m/s, and  with an assumed right atrial pressure of 3 mmHg, the estimated right ventricular systolic pressure is 48.1 mmHg. Left Atrium: Left atrial size was normal in size. Right Atrium: Right atrial size was normal in size. Pericardium: There is no evidence of pericardial effusion. Mitral Valve: The mitral valve is normal in structure. Mild mitral annular calcification. Trivial mitral valve regurgitation. Tricuspid Valve: The tricuspid valve is normal in structure. Tricuspid valve regurgitation is trivial. Aortic Valve: The aortic valve is calcified. There is severe calcifcation of the aortic valve. There is severe thickening of the aortic valve. Aortic valve  regurgitation is not visualized. Moderate to severe aortic stenosis is present. Aortic valve mean gradient measures 17.8 mmHg. Aortic valve peak gradient measures 32.6 mmHg. Aortic valve area, by VTI measures 0.97 cm. Pulmonic Valve: The pulmonic valve was not well visualized. Pulmonic valve regurgitation is trivial. Aorta: Aortic dilatation noted. There is moderate dilatation of the ascending aorta, measuring 45 mm. Venous: The inferior vena cava is normal in size with greater than 50% respiratory variability, suggesting right atrial pressure of 3 mmHg. IAS/Shunts: The atrial septum is grossly normal. Agitated saline contrast was given intravenously to evaluate for intracardiac shunting.  LEFT VENTRICLE PLAX 2D LVIDd:         3.40 cm   Diastology LVIDs:         2.40 cm   LV e' medial:    5.11 cm/s LV PW:         1.30 cm   LV E/e' medial:  12.6 LV IVS:        1.50 cm   LV e' lateral:   5.22 cm/s LVOT diam:     2.40 cm   LV E/e' lateral: 12.3 LV SV:         49 LV SV Index:   26 LVOT Area:     4.52 cm  RIGHT VENTRICLE             IVC RV Basal diam:  3.40 cm     IVC diam: 1.20 cm RV Mid diam:    2.90 cm RV S prime:     15.40 cm/s TAPSE (M-mode): 1.9 cm LEFT ATRIUM  Index        RIGHT ATRIUM           Index LA diam:      2.00 cm 1.06 cm/m   RA Area:     10.40 cm LA Vol (A2C): 16.1 ml 8.54 ml/m   RA Volume:   23.70 ml  12.56 ml/m LA Vol (A4C): 24.0 ml 12.72 ml/m  AORTIC VALVE AV Area (Vmax):    0.93 cm AV Area (Vmean):   0.98 cm AV Area (VTI):     0.97 cm AV Vmax:           285.50 cm/s AV Vmean:          187.250 cm/s AV VTI:            0.507 m AV Peak Grad:      32.6 mmHg AV Mean Grad:      17.8 mmHg LVOT Vmax:         58.80 cm/s LVOT Vmean:        40.400 cm/s LVOT VTI:          0.109 m LVOT/AV VTI ratio: 0.21  AORTA Ao Root diam: 3.50 cm Ao Asc diam:  4.50 cm MITRAL VALVE               TRICUSPID VALVE MV Area (PHT): 2.36 cm    TR Peak grad:   31.6 mmHg MV Decel Time: 322 msec    TR Vmax:         281.00 cm/s MV E velocity: 64.30 cm/s MV A velocity: 80.80 cm/s  SHUNTS MV E/A ratio:  0.80        Systemic VTI:  0.11 m                            Systemic Diam: 2.40 cm Gwyndolyn Kaufman MD Electronically signed by Gwyndolyn Kaufman MD Signature Date/Time: 02/14/2022/5:22:27 PM    Final    VAS [place] LOWER EXTREMITY VENOUS (DVT)  Result Date: 02/14/2022  Lower Venous DVT Study Patient Name:  KERRICK MILER  Date of Exam:   02/14/2022 Medical Rec #: [id number]       Accession #:    [id number] Date of Birth: 1941-11-21       Patient Gender: M Patient Age:   59 years Exam Location:  [facility] Procedure:      VAS [place] LOWER EXTREMITY VENOUS (DVT) Referring Phys: [place] --------------------------------------------------------------------------------  Indications: Edema.  Comparison Study: No prior study Performing Technologist: Maudry Mayhew MHA, RDMS, RVT, RDCS  Examination Guidelines: A complete evaluation includes B-mode imaging, spectral Doppler, color Doppler, and power Doppler as needed of all accessible portions of each vessel. Bilateral testing is considered an integral part of a complete examination. Limited examinations for reoccurring indications may be performed as noted. The reflux portion of the exam is performed with the patient in reverse Trendelenburg.  +-----+---------------+---------+-----------+----------+--------------+  RIGHT Compressibility Phasicity Spontaneity Properties Thrombus Aging  +-----+---------------+---------+-----------+----------+--------------+  CFV   Full            Yes       Yes                                    +-----+---------------+---------+-----------+----------+--------------+   +---------+---------------+---------+-----------+----------+-----------------+  LEFT      Compressibility Phasicity Spontaneity Properties Thrombus Aging     +---------+---------------+---------+-----------+----------+-----------------+  CFV  Full            No        Yes                                        +---------+---------------+---------+-----------+----------+-----------------+  SFJ       Full                                                                +---------+---------------+---------+-----------+----------+-----------------+  FV Prox   Full                                                                +---------+---------------+---------+-----------+----------+-----------------+  FV Mid    None                      No                     Acute              +---------+---------------+---------+-----------+----------+-----------------+  FV Distal None                      No                     Acute              +---------+---------------+---------+-----------+----------+-----------------+  PFV       Full                                                                +---------+---------------+---------+-----------+----------+-----------------+  POP       Full            No        Yes                                       +---------+---------------+---------+-----------+----------+-----------------+  PTV       None            Yes       Yes                    Age Indeterminate  +---------+---------------+---------+-----------+----------+-----------------+  PERO      None                      No                     Acute              +---------+---------------+---------+-----------+----------+-----------------+    Summary: RIGHT: - No evidence of common femoral vein obstruction.  LEFT: - Findings consistent with acute deep vein thrombosis involving the left femoral vein, and left peroneal veins. - Findings consistent with age indeterminate deep vein thrombosis involving the left peroneal veins.  *See table(s) above for measurements and observations. Electronically signed by Servando Snare MD on 02/14/2022 at 10:42:12 AM.    Final    US THORACENTESIS ASP PLEURAL SPACE W/IMG GUIDE  Result Date: 02/10/2022 INDICATION: Patient with metastatic pancreatic cancer presents today with  left pleural effusion. Interventional radiology asked to perform a diagnostic and therapeutic thoracentesis. EXAM: ULTRASOUND GUIDED THORACENTESIS MEDICATIONS: 1% lidocaine 10 mL COMPLICATIONS: None immediate. PROCEDURE: An ultrasound guided thoracentesis was thoroughly discussed with the patient and questions answered. The benefits, risks, alternatives and complications were also discussed. The patient understands and wishes to proceed with the procedure. Written consent was obtained. Ultrasound was performed to localize and mark an adequate pocket of fluid in the left chest. Loculated collection identified. The area was then prepped and draped in the normal sterile fashion. 1% Lidocaine was used for local anesthesia. Under ultrasound guidance a 6 Fr Safe-T-Centesis catheter was introduced. Thoracentesis was performed. The catheter was removed and a dressing applied. FINDINGS: A total of approximately 50 mL of light red/thick/purulent fluid was removed. Samples were sent to the laboratory as requested by the clinical team. IMPRESSION: Successful ultrasound guided left thoracentesis yielding 50 mL of pleural fluid. Read by: Soyla Dryer, NP Electronically Signed   By: Aletta Edouard M.D.   On: 02/10/2022 14:38    Microbiology: Results for orders placed or performed during the hospital encounter of 01/28/22  Urine Culture     Status: Abnormal   Collection Time: 01/28/22  3:00 PM   Specimen: Partial Nephrectomy Left; Urine  Result Value Ref Range Status   Specimen Description   Final    URINE, RANDOM Performed at [facility], [address] [place], [place] [address]    Special Requests   Final    NONE Performed at [facility], [address] [place], [place] [address]    Culture >=100,000 COLONIES/mL ESCHERICHIA COLI (A)  Final   Report Status 01/31/2022 FINAL  Final   Organism ID, Bacteria ESCHERICHIA COLI (A)  Final      Susceptibility   Escherichia coli - MIC*     AMPICILLIN 8 SENSITIVE Sensitive     CEFAZOLIN <=4 SENSITIVE Sensitive     CEFEPIME <=0.12 SENSITIVE Sensitive     CEFTRIAXONE <=0.25 SENSITIVE Sensitive     CIPROFLOXACIN <=0.25 SENSITIVE Sensitive     GENTAMICIN <=1 SENSITIVE Sensitive     IMIPENEM <=0.25 SENSITIVE Sensitive     NITROFURANTOIN <=16 SENSITIVE Sensitive     TRIMETH/SULFA <=20 SENSITIVE Sensitive     AMPICILLIN/SULBACTAM 4 SENSITIVE Sensitive     PIP/TAZO <=4 SENSITIVE Sensitive     * >=100,000 COLONIES/mL ESCHERICHIA COLI  Culture, blood (routine x 2)     Status: None   Collection Time: 02/04/22  3:42 PM   Specimen: BLOOD  Result Value Ref Range Status   Specimen Description BLOOD LEFT ANTECUBITAL  Final   Special Requests   Final    BOTTLES DRAWN AEROBIC AND ANAEROBIC Blood Culture adequate volume   Culture   Final    NO GROWTH 5 DAYS Performed at [facility], [place] [address] [place], [place] [address]    Report Status 02/09/2022 FINAL  Final  Culture, blood (routine x 2)     Status: None   Collection  Time: 02/04/22  3:42 PM   Specimen: BLOOD  Result Value Ref Range Status   Specimen Description BLOOD RIGHT ANTECUBITAL  Final   Special Requests   Final    BOTTLES DRAWN AEROBIC ONLY Blood Culture results may not be optimal due to an inadequate volume of blood received in culture bottles   Culture   Final    NO GROWTH 5 DAYS Performed at [facility], [place] [address] [place], [place] [address]    Report Status 02/09/2022 FINAL  Final  Body fluid culture w Gram Stain     Status: None (Preliminary result)   Collection Time: 02/10/22  1:37 PM   Specimen: PATH Cytology Pleural fluid  Result Value Ref Range Status   Specimen Description   Final    PLEURAL Performed at [facility] [address] [place], [place] [address]    Special Requests   Final    NONE Performed at [facility], [place] [address] [place], [place] [address]    Gram Stain FEW WBC SEEN NO  ORGANISMS SEEN   Final   Culture   Final    MODERATE ENTEROCOCCUS FAECIUM VANCOMYCIN RESISTANT ENTEROCOCCUS CRITICAL RESULT CALLED TO, READ BACK BY AND VERIFIED WITH: RN LISA.P AT 1144 ON 02/11/2022 BY T.SAAD. Sent to [facility] for further susceptibility testing. Performed at [facility], [place] [address], [place], [place] [address]    Report Status PENDING  Incomplete   Organism ID, Bacteria ENTEROCOCCUS FAECIUM  Final      Susceptibility   Enterococcus faecium - MIC*    AMPICILLIN >=32 RESISTANT Resistant     VANCOMYCIN >=32 RESISTANT Resistant     GENTAMICIN SYNERGY SENSITIVE Sensitive     LINEZOLID 2 SENSITIVE Sensitive     * MODERATE ENTEROCOCCUS FAECIUM    Labs: CBC: Recent Labs  Lab 02/18/22 0358  WBC 22.0*  NEUTROABS 16.6*  HGB 9.8*  HCT 31.7*  MCV 98.4  PLT 2,863*   Basic Metabolic Panel: Recent Labs  Lab 02/19/22 0354  NA 132*  K 4.0  CL 99  CO2 26  GLUCOSE 130*  BUN 46*  CREATININE 1.20  CALCIUM 7.9*  MG 2.0  PHOS 3.5   Liver Function Tests: No results for input(s): AST, ALT, ALKPHOS, BILITOT, PROT, ALBUMIN in the last 168 hours. CBG: Recent Labs  Lab 02/20/22 0647 02/20/22 0831 02/21/22 0605 02/23/22 0003 02/23/22 0805  GLUCAP 89 76 71 65* 70    Discharge time spent: greater than 30 minutes.  Signed: Elmarie [place], MD [facility] 02/24/2022

## 2022-02-24 NOTE — TOC Progression Note (Signed)
Transition of Care (TOC) - Progression Note  ? ? ?Patient Details  ?Name: Kevin Villegas ?MRN: [id number] ?Date of Birth: 1941/04/12 ? ?Transition of Care (TOC) CM/SW Contact  ?Purcell Mouton, RN ?Phone Number: ?02/24/2022, 10:21 AM ? ?Clinical Narrative:    ?Pt has a bed at [facility] asked for a rapid COVID test. MD and RN are aware. Wife was called to inform her that pt has a bed at [facility] today.   ? ? ?Expected Discharge Plan: [place] ?Barriers to Discharge: Continued Medical Work up ? ?Expected Discharge Plan and Services ?Expected Discharge Plan: [place] ?  ?Discharge Planning Services: CM Consult ?  ?  ?Expected Discharge Date: 02/21/22               ?  ?  ?  ?  ?  ?  ?  ?  ?  ?  ? ? ?Social Determinants of Health (SDOH) Interventions ?  ? ?Readmission Risk Interventions ?Readmission Risk Prevention Plan 02/03/2022 06/16/2021 06/11/2021  ?Transportation Screening Complete Complete Complete  ?PCP or Specialist Appt within 3-5 Days - - Complete  ?[place] or Home Care Consult - - Complete  ?Social Work Consult for [place] Planning/Counseling - - Complete  ?Palliative Care Screening - - Not Applicable  ?Medication Review Press photographer) Complete Complete Complete  ?PCP or Specialist appointment within 3-5 days of discharge Complete Complete -  ?[place] or Home Care Consult Complete Complete -  ?SW Recovery Care/Counseling Consult Complete Complete -  ?Palliative Care Screening Not Applicable Not Applicable -  ?[place] Not Applicable Not Applicable -  ?Some recent data might be hidden  ? ? ?

## 2022-02-26 LAB — BODY FLUID CULTURE W GRAM STAIN

## 2022-02-28 ENCOUNTER — Inpatient Hospital Stay (HOSPITAL_COMMUNITY): Admission: RE | Admit: 2022-02-28 | Payer: Medicare Other | Source: Ambulatory Visit

## 2022-03-10 ENCOUNTER — Other Ambulatory Visit: Payer: Medicare Other

## 2022-03-19 DEATH — deceased

## 2022-05-03 ENCOUNTER — Encounter: Payer: Self-pay | Admitting: Hematology & Oncology

## 2022-05-10 ENCOUNTER — Inpatient Hospital Stay: Payer: Self-pay | Attending: Hematology & Oncology

## 2022-05-10 ENCOUNTER — Inpatient Hospital Stay: Payer: Self-pay

## 2023-02-26 IMAGING — DX DG ABDOMEN ACUTE W/ 1V CHEST
3 series · 3 of 3 positions shown · non-contrast
Comparison: CT abdomen/pelvis dated 11/24/2019

CLINICAL DATA: Abdominal pain, constipation, history of pancreatic
cancer

EXAM:
DG ABDOMEN ACUTE WITH 1 VIEW CHEST

[abdomen supine]
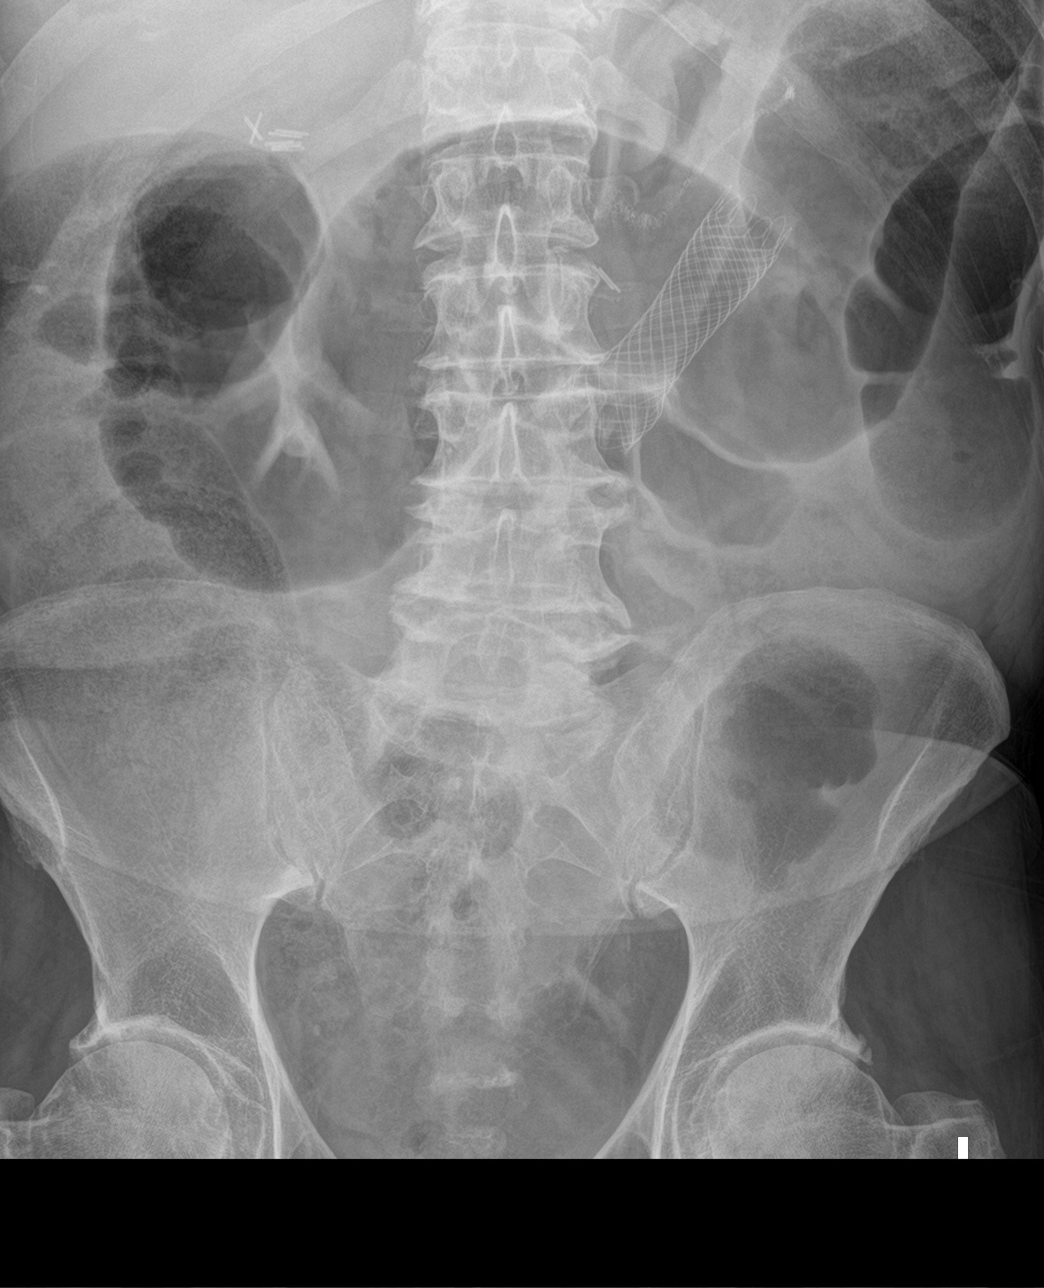

[chest pa]
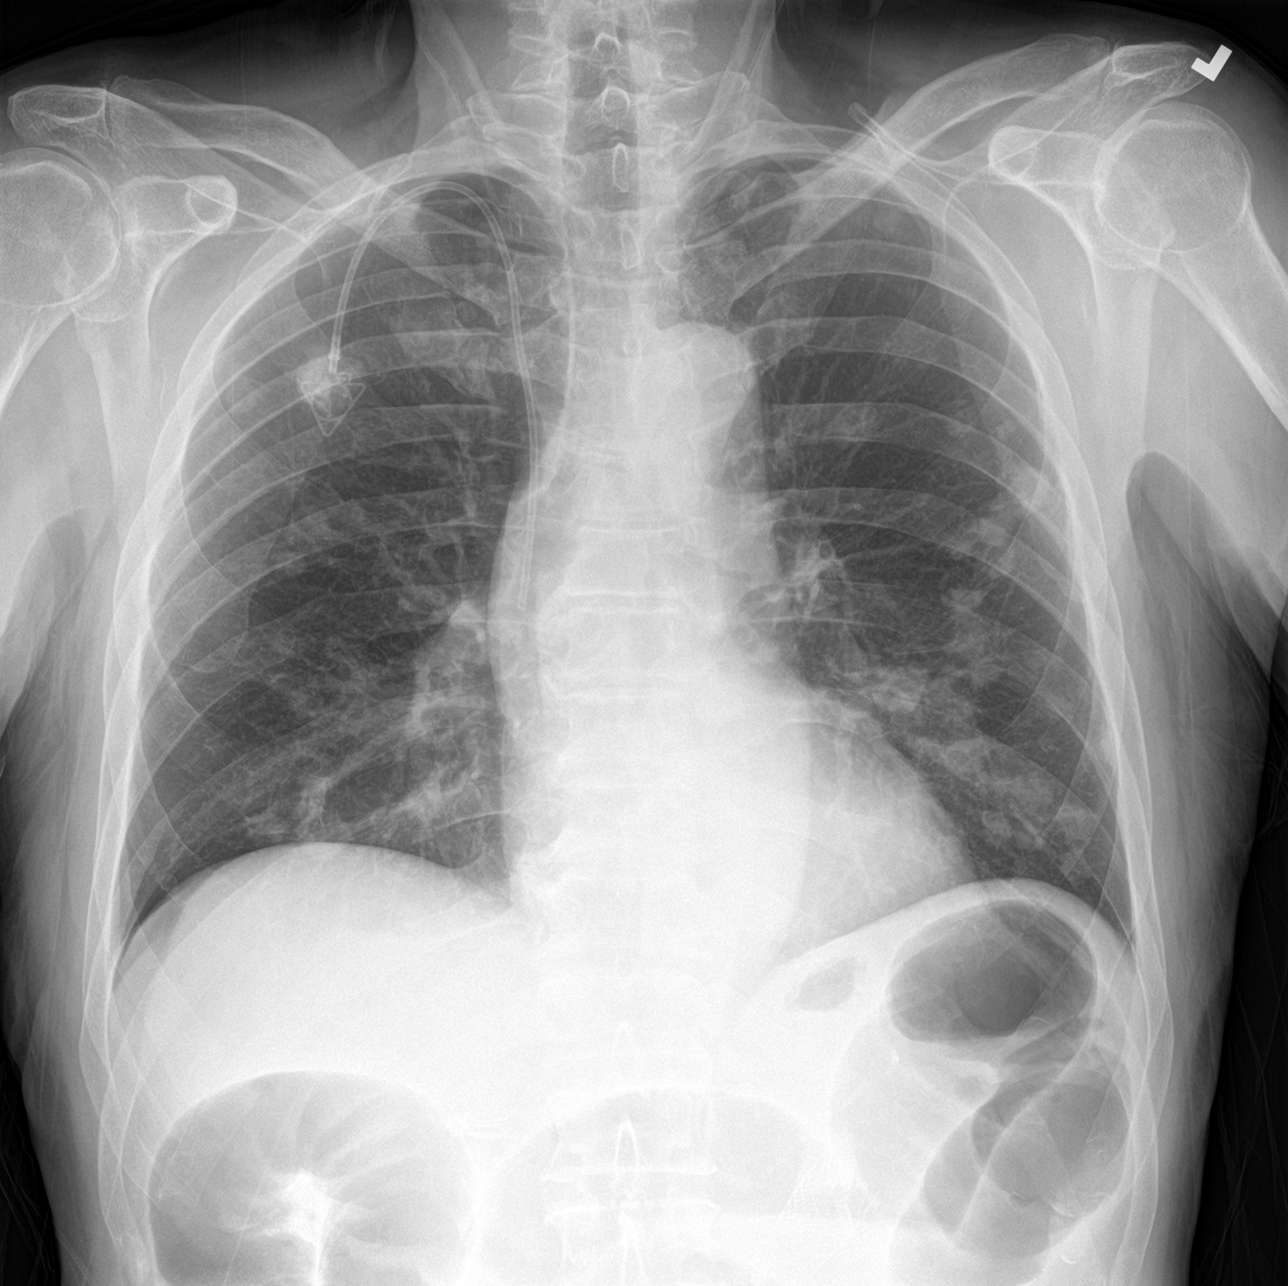

[abdomen erect]
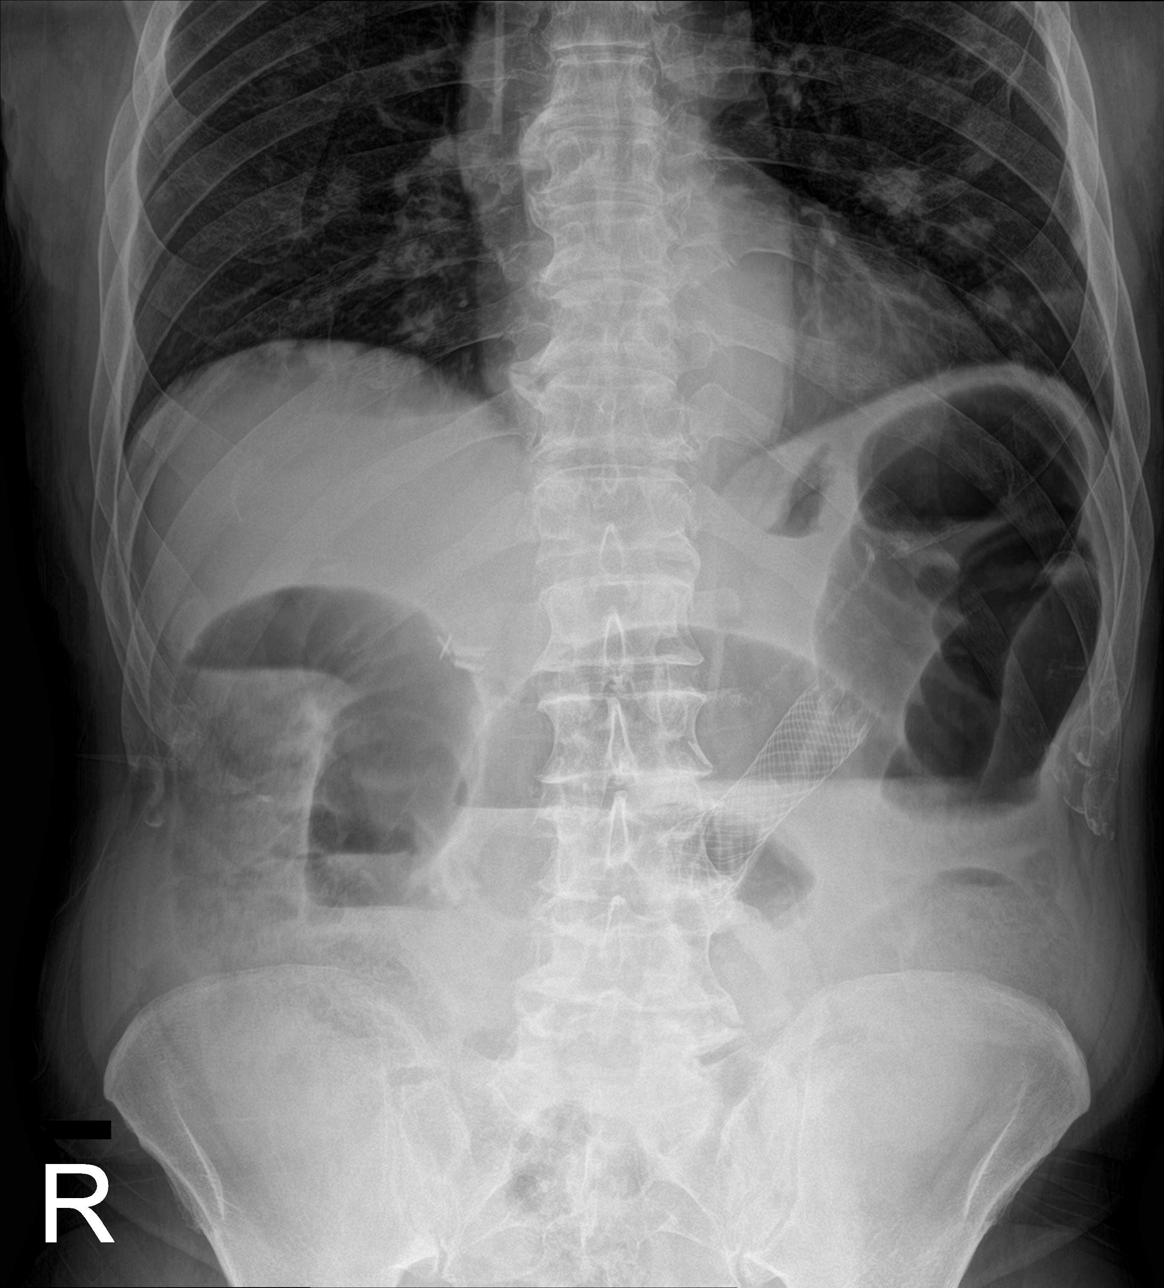

[3 of 3 positions shown; findings below may reference images not displayed]

FINDINGS: Nodular opacities in the bilateral upper lobes and left lower lobe,
suspicious for metastases.

Right chest power port terminates in the lower SVC.

The heart is normal in size.

Nonobstructive bowel gas pattern with gas and fluid-filled loops of
colon in the upper abdomen.

Cholecystectomy clips. Surgical sutures in the left upper abdomen.
Metallic stent in the left mid abdomen.

Mild degenerative changes of the lumbar spine.
IMPRESSION: Multiple bilateral pulmonary nodules, suspicious for metastases.

Nonobstructive bowel gas pattern.

Postsurgical changes in the abdomen.
